# Patient Record
Sex: Male | Born: 1940 | Race: White | Hispanic: No | Marital: Married | Smoking: Never smoker
Health system: Southern US, Community
[De-identification: ages and names within clinical notes are randomized; demographics above are authoritative.]

## PROBLEM LIST (undated history)

## (undated) DIAGNOSIS — C61 Malignant neoplasm of prostate: Secondary | ICD-10-CM

## (undated) DIAGNOSIS — I255 Ischemic cardiomyopathy: Secondary | ICD-10-CM

## (undated) DIAGNOSIS — I5022 Chronic systolic (congestive) heart failure: Secondary | ICD-10-CM

## (undated) DIAGNOSIS — D696 Thrombocytopenia, unspecified: Secondary | ICD-10-CM

## (undated) DIAGNOSIS — N529 Male erectile dysfunction, unspecified: Secondary | ICD-10-CM

## (undated) DIAGNOSIS — K922 Gastrointestinal hemorrhage, unspecified: Secondary | ICD-10-CM

## (undated) DIAGNOSIS — E1165 Type 2 diabetes mellitus with hyperglycemia: Secondary | ICD-10-CM

## (undated) DIAGNOSIS — N189 Chronic kidney disease, unspecified: Secondary | ICD-10-CM

## (undated) DIAGNOSIS — E785 Hyperlipidemia, unspecified: Secondary | ICD-10-CM

## (undated) DIAGNOSIS — I739 Peripheral vascular disease, unspecified: Secondary | ICD-10-CM

## (undated) DIAGNOSIS — I4891 Unspecified atrial fibrillation: Secondary | ICD-10-CM

## (undated) DIAGNOSIS — K625 Hemorrhage of anus and rectum: Secondary | ICD-10-CM

## (undated) DIAGNOSIS — F17200 Nicotine dependence, unspecified, uncomplicated: Secondary | ICD-10-CM

## (undated) DIAGNOSIS — I10 Essential (primary) hypertension: Secondary | ICD-10-CM

## (undated) DIAGNOSIS — E1129 Type 2 diabetes mellitus with other diabetic kidney complication: Secondary | ICD-10-CM

## (undated) DIAGNOSIS — N200 Calculus of kidney: Secondary | ICD-10-CM

## (undated) DIAGNOSIS — I2510 Atherosclerotic heart disease of native coronary artery without angina pectoris: Secondary | ICD-10-CM

## (undated) HISTORY — DX: Thrombocytopenia, unspecified: D69.6

## (undated) HISTORY — DX: Calculus of kidney: N20.0

## (undated) HISTORY — PX: BACK SURGERY: SHX140

## (undated) HISTORY — DX: Essential (primary) hypertension: I10

## (undated) HISTORY — DX: Type 2 diabetes mellitus with hyperglycemia: E11.65

## (undated) HISTORY — DX: Nicotine dependence, unspecified, uncomplicated: F17.200

## (undated) HISTORY — DX: Hyperlipidemia, unspecified: E78.5

## (undated) HISTORY — DX: Peripheral vascular disease, unspecified: I73.9

## (undated) HISTORY — DX: Chronic systolic (congestive) heart failure: I50.22

## (undated) HISTORY — DX: Male erectile dysfunction, unspecified: N52.9

## (undated) HISTORY — DX: Hemorrhage of anus and rectum: K62.5

## (undated) HISTORY — DX: Atherosclerotic heart disease of native coronary artery without angina pectoris: I25.10

## (undated) HISTORY — DX: Chronic kidney disease, unspecified: N18.9

## (undated) HISTORY — DX: Gastrointestinal hemorrhage, unspecified: K92.2

## (undated) HISTORY — DX: Unspecified atrial fibrillation: I48.91

## (undated) HISTORY — DX: Malignant neoplasm of prostate: C61

## (undated) HISTORY — DX: Ischemic cardiomyopathy: I25.5

## (undated) HISTORY — DX: Type 2 diabetes mellitus with other diabetic kidney complication: E11.29

---

## 1997-12-26 DIAGNOSIS — N209 Urinary calculus, unspecified: Secondary | ICD-10-CM | POA: Insufficient documentation

## 1997-12-26 HISTORY — DX: Urinary calculus, unspecified: N20.9

## 1999-04-26 DIAGNOSIS — E785 Hyperlipidemia, unspecified: Secondary | ICD-10-CM | POA: Insufficient documentation

## 1999-04-26 DIAGNOSIS — E1169 Type 2 diabetes mellitus with other specified complication: Secondary | ICD-10-CM | POA: Insufficient documentation

## 2000-02-04 DIAGNOSIS — I2510 Atherosclerotic heart disease of native coronary artery without angina pectoris: Secondary | ICD-10-CM | POA: Insufficient documentation

## 2003-12-27 HISTORY — PX: COLONOSCOPY: SHX174

## 2004-01-01 ENCOUNTER — Emergency Department (HOSPITAL_COMMUNITY): Admission: EM | Admit: 2004-01-01 | Discharge: 2004-01-01 | Payer: Self-pay

## 2004-02-23 DIAGNOSIS — K922 Gastrointestinal hemorrhage, unspecified: Secondary | ICD-10-CM | POA: Insufficient documentation

## 2004-03-30 ENCOUNTER — Ambulatory Visit (HOSPITAL_COMMUNITY): Admission: RE | Admit: 2004-03-30 | Discharge: 2004-03-30 | Payer: Self-pay | Admitting: *Deleted

## 2004-06-22 DIAGNOSIS — R21 Rash and other nonspecific skin eruption: Secondary | ICD-10-CM | POA: Insufficient documentation

## 2005-03-29 ENCOUNTER — Encounter: Admission: RE | Admit: 2005-03-29 | Discharge: 2005-03-29 | Payer: Self-pay | Admitting: Urology

## 2005-04-05 ENCOUNTER — Ambulatory Visit: Admission: RE | Admit: 2005-04-05 | Discharge: 2005-05-11 | Payer: Self-pay | Admitting: Radiation Oncology

## 2005-07-06 ENCOUNTER — Ambulatory Visit: Admission: RE | Admit: 2005-07-06 | Discharge: 2005-10-04 | Payer: Self-pay | Admitting: Radiation Oncology

## 2005-10-05 ENCOUNTER — Ambulatory Visit: Admission: RE | Admit: 2005-10-05 | Discharge: 2006-01-03 | Payer: Self-pay | Admitting: Radiation Oncology

## 2005-10-27 DIAGNOSIS — I10 Essential (primary) hypertension: Secondary | ICD-10-CM | POA: Insufficient documentation

## 2005-10-27 DIAGNOSIS — N183 Chronic kidney disease, stage 3 unspecified: Secondary | ICD-10-CM | POA: Insufficient documentation

## 2006-09-08 DIAGNOSIS — R0789 Other chest pain: Secondary | ICD-10-CM | POA: Insufficient documentation

## 2006-09-08 DIAGNOSIS — Z79899 Other long term (current) drug therapy: Secondary | ICD-10-CM | POA: Insufficient documentation

## 2006-09-08 DIAGNOSIS — R39198 Other difficulties with micturition: Secondary | ICD-10-CM | POA: Insufficient documentation

## 2006-09-21 DIAGNOSIS — R739 Hyperglycemia, unspecified: Secondary | ICD-10-CM | POA: Insufficient documentation

## 2006-11-28 DIAGNOSIS — E119 Type 2 diabetes mellitus without complications: Secondary | ICD-10-CM | POA: Insufficient documentation

## 2007-09-04 DIAGNOSIS — IMO0001 Reserved for inherently not codable concepts without codable children: Secondary | ICD-10-CM | POA: Insufficient documentation

## 2007-10-08 DIAGNOSIS — Z87442 Personal history of urinary calculi: Secondary | ICD-10-CM | POA: Insufficient documentation

## 2007-10-08 DIAGNOSIS — Z8546 Personal history of malignant neoplasm of prostate: Secondary | ICD-10-CM | POA: Insufficient documentation

## 2007-10-08 DIAGNOSIS — N529 Male erectile dysfunction, unspecified: Secondary | ICD-10-CM | POA: Insufficient documentation

## 2007-10-08 HISTORY — DX: Personal history of urinary calculi: Z87.442

## 2008-12-26 HISTORY — PX: CATARACT EXTRACTION: SUR2

## 2009-09-30 DIAGNOSIS — R809 Proteinuria, unspecified: Secondary | ICD-10-CM

## 2009-09-30 HISTORY — DX: Proteinuria, unspecified: R80.9

## 2009-11-11 DIAGNOSIS — E669 Obesity, unspecified: Secondary | ICD-10-CM | POA: Insufficient documentation

## 2009-11-11 DIAGNOSIS — E1129 Type 2 diabetes mellitus with other diabetic kidney complication: Secondary | ICD-10-CM | POA: Insufficient documentation

## 2009-11-11 DIAGNOSIS — Z720 Tobacco use: Secondary | ICD-10-CM | POA: Insufficient documentation

## 2009-11-11 HISTORY — DX: Obesity, unspecified: E66.9

## 2009-11-11 HISTORY — DX: Tobacco use: Z72.0

## 2010-02-10 DIAGNOSIS — D696 Thrombocytopenia, unspecified: Secondary | ICD-10-CM | POA: Insufficient documentation

## 2010-02-10 DIAGNOSIS — C61 Malignant neoplasm of prostate: Secondary | ICD-10-CM | POA: Insufficient documentation

## 2010-05-12 DIAGNOSIS — K625 Hemorrhage of anus and rectum: Secondary | ICD-10-CM | POA: Insufficient documentation

## 2010-05-12 DIAGNOSIS — Z7689 Persons encountering health services in other specified circumstances: Secondary | ICD-10-CM | POA: Insufficient documentation

## 2010-05-12 DIAGNOSIS — Z0000 Encounter for general adult medical examination without abnormal findings: Secondary | ICD-10-CM | POA: Insufficient documentation

## 2010-05-12 DIAGNOSIS — I739 Peripheral vascular disease, unspecified: Secondary | ICD-10-CM | POA: Insufficient documentation

## 2010-05-12 HISTORY — DX: Persons encountering health services in other specified circumstances: Z76.89

## 2010-06-10 DIAGNOSIS — I10 Essential (primary) hypertension: Secondary | ICD-10-CM

## 2010-06-10 HISTORY — DX: Essential (primary) hypertension: I10

## 2010-09-14 DIAGNOSIS — Z794 Long term (current) use of insulin: Secondary | ICD-10-CM | POA: Insufficient documentation

## 2010-09-14 DIAGNOSIS — E119 Type 2 diabetes mellitus without complications: Secondary | ICD-10-CM | POA: Insufficient documentation

## 2011-03-16 DIAGNOSIS — E663 Overweight: Secondary | ICD-10-CM | POA: Insufficient documentation

## 2011-05-10 DIAGNOSIS — E1129 Type 2 diabetes mellitus with other diabetic kidney complication: Secondary | ICD-10-CM

## 2011-05-10 DIAGNOSIS — E669 Obesity, unspecified: Secondary | ICD-10-CM

## 2011-05-10 DIAGNOSIS — I2510 Atherosclerotic heart disease of native coronary artery without angina pectoris: Secondary | ICD-10-CM

## 2011-05-10 DIAGNOSIS — C61 Malignant neoplasm of prostate: Secondary | ICD-10-CM

## 2011-05-10 DIAGNOSIS — R809 Proteinuria, unspecified: Secondary | ICD-10-CM

## 2011-05-10 DIAGNOSIS — R21 Rash and other nonspecific skin eruption: Secondary | ICD-10-CM

## 2011-05-10 DIAGNOSIS — Z8546 Personal history of malignant neoplasm of prostate: Secondary | ICD-10-CM

## 2011-05-10 DIAGNOSIS — IMO0001 Reserved for inherently not codable concepts without codable children: Secondary | ICD-10-CM

## 2011-05-10 DIAGNOSIS — R0789 Other chest pain: Secondary | ICD-10-CM

## 2011-05-10 DIAGNOSIS — K922 Gastrointestinal hemorrhage, unspecified: Secondary | ICD-10-CM

## 2011-05-10 DIAGNOSIS — Z87442 Personal history of urinary calculi: Secondary | ICD-10-CM

## 2011-05-10 DIAGNOSIS — D696 Thrombocytopenia, unspecified: Secondary | ICD-10-CM

## 2011-05-10 DIAGNOSIS — I10 Essential (primary) hypertension: Secondary | ICD-10-CM

## 2011-05-10 DIAGNOSIS — I739 Peripheral vascular disease, unspecified: Secondary | ICD-10-CM

## 2011-05-10 DIAGNOSIS — E663 Overweight: Secondary | ICD-10-CM

## 2011-05-10 DIAGNOSIS — N529 Male erectile dysfunction, unspecified: Secondary | ICD-10-CM

## 2011-05-10 DIAGNOSIS — R39198 Other difficulties with micturition: Secondary | ICD-10-CM

## 2011-05-10 DIAGNOSIS — E785 Hyperlipidemia, unspecified: Secondary | ICD-10-CM

## 2011-05-10 DIAGNOSIS — E119 Type 2 diabetes mellitus without complications: Secondary | ICD-10-CM

## 2011-05-10 DIAGNOSIS — Z0000 Encounter for general adult medical examination without abnormal findings: Secondary | ICD-10-CM

## 2011-05-10 DIAGNOSIS — R739 Hyperglycemia, unspecified: Secondary | ICD-10-CM

## 2011-05-10 DIAGNOSIS — N209 Urinary calculus, unspecified: Secondary | ICD-10-CM

## 2011-05-10 DIAGNOSIS — K625 Hemorrhage of anus and rectum: Secondary | ICD-10-CM

## 2011-05-10 DIAGNOSIS — Z720 Tobacco use: Secondary | ICD-10-CM

## 2011-05-10 DIAGNOSIS — Z79899 Other long term (current) drug therapy: Secondary | ICD-10-CM

## 2012-01-10 DIAGNOSIS — E1129 Type 2 diabetes mellitus with other diabetic kidney complication: Secondary | ICD-10-CM | POA: Diagnosis not present

## 2012-01-10 DIAGNOSIS — E663 Overweight: Secondary | ICD-10-CM | POA: Diagnosis not present

## 2012-01-10 DIAGNOSIS — C61 Malignant neoplasm of prostate: Secondary | ICD-10-CM | POA: Diagnosis not present

## 2012-01-10 DIAGNOSIS — R21 Rash and other nonspecific skin eruption: Secondary | ICD-10-CM | POA: Diagnosis not present

## 2012-01-10 DIAGNOSIS — I129 Hypertensive chronic kidney disease with stage 1 through stage 4 chronic kidney disease, or unspecified chronic kidney disease: Secondary | ICD-10-CM | POA: Diagnosis not present

## 2012-01-10 DIAGNOSIS — I10 Essential (primary) hypertension: Secondary | ICD-10-CM | POA: Diagnosis not present

## 2012-01-11 DIAGNOSIS — Z8546 Personal history of malignant neoplasm of prostate: Secondary | ICD-10-CM | POA: Diagnosis not present

## 2012-01-17 DIAGNOSIS — E1129 Type 2 diabetes mellitus with other diabetic kidney complication: Secondary | ICD-10-CM | POA: Diagnosis not present

## 2012-01-17 DIAGNOSIS — E785 Hyperlipidemia, unspecified: Secondary | ICD-10-CM | POA: Diagnosis not present

## 2012-01-17 DIAGNOSIS — I10 Essential (primary) hypertension: Secondary | ICD-10-CM | POA: Diagnosis not present

## 2012-01-17 DIAGNOSIS — R21 Rash and other nonspecific skin eruption: Secondary | ICD-10-CM | POA: Diagnosis not present

## 2012-01-17 DIAGNOSIS — E669 Obesity, unspecified: Secondary | ICD-10-CM | POA: Diagnosis not present

## 2012-01-18 DIAGNOSIS — E291 Testicular hypofunction: Secondary | ICD-10-CM | POA: Diagnosis not present

## 2012-01-18 DIAGNOSIS — Z8546 Personal history of malignant neoplasm of prostate: Secondary | ICD-10-CM | POA: Diagnosis not present

## 2012-01-18 DIAGNOSIS — Z87442 Personal history of urinary calculi: Secondary | ICD-10-CM | POA: Diagnosis not present

## 2012-03-15 DIAGNOSIS — I129 Hypertensive chronic kidney disease with stage 1 through stage 4 chronic kidney disease, or unspecified chronic kidney disease: Secondary | ICD-10-CM | POA: Diagnosis not present

## 2012-03-15 DIAGNOSIS — E1129 Type 2 diabetes mellitus with other diabetic kidney complication: Secondary | ICD-10-CM | POA: Diagnosis not present

## 2012-03-19 DIAGNOSIS — R809 Proteinuria, unspecified: Secondary | ICD-10-CM | POA: Diagnosis not present

## 2012-03-19 DIAGNOSIS — I10 Essential (primary) hypertension: Secondary | ICD-10-CM | POA: Diagnosis not present

## 2012-03-19 DIAGNOSIS — E1129 Type 2 diabetes mellitus with other diabetic kidney complication: Secondary | ICD-10-CM | POA: Diagnosis not present

## 2012-03-19 DIAGNOSIS — E785 Hyperlipidemia, unspecified: Secondary | ICD-10-CM | POA: Diagnosis not present

## 2012-04-05 DIAGNOSIS — R809 Proteinuria, unspecified: Secondary | ICD-10-CM | POA: Diagnosis not present

## 2012-04-05 DIAGNOSIS — I10 Essential (primary) hypertension: Secondary | ICD-10-CM | POA: Diagnosis not present

## 2012-04-17 DIAGNOSIS — E785 Hyperlipidemia, unspecified: Secondary | ICD-10-CM | POA: Diagnosis not present

## 2012-04-17 DIAGNOSIS — E1129 Type 2 diabetes mellitus with other diabetic kidney complication: Secondary | ICD-10-CM | POA: Diagnosis not present

## 2012-04-19 DIAGNOSIS — E785 Hyperlipidemia, unspecified: Secondary | ICD-10-CM | POA: Diagnosis not present

## 2012-04-19 DIAGNOSIS — I10 Essential (primary) hypertension: Secondary | ICD-10-CM | POA: Diagnosis not present

## 2012-04-19 DIAGNOSIS — E1129 Type 2 diabetes mellitus with other diabetic kidney complication: Secondary | ICD-10-CM | POA: Diagnosis not present

## 2012-07-12 DIAGNOSIS — E119 Type 2 diabetes mellitus without complications: Secondary | ICD-10-CM | POA: Diagnosis not present

## 2012-07-12 DIAGNOSIS — E785 Hyperlipidemia, unspecified: Secondary | ICD-10-CM | POA: Diagnosis not present

## 2012-07-16 DIAGNOSIS — I10 Essential (primary) hypertension: Secondary | ICD-10-CM | POA: Diagnosis not present

## 2012-07-16 DIAGNOSIS — E1129 Type 2 diabetes mellitus with other diabetic kidney complication: Secondary | ICD-10-CM | POA: Diagnosis not present

## 2012-07-16 DIAGNOSIS — E785 Hyperlipidemia, unspecified: Secondary | ICD-10-CM | POA: Diagnosis not present

## 2012-07-18 DIAGNOSIS — Z8546 Personal history of malignant neoplasm of prostate: Secondary | ICD-10-CM | POA: Diagnosis not present

## 2012-07-25 DIAGNOSIS — Z87442 Personal history of urinary calculi: Secondary | ICD-10-CM | POA: Diagnosis not present

## 2012-07-25 DIAGNOSIS — Z8546 Personal history of malignant neoplasm of prostate: Secondary | ICD-10-CM | POA: Diagnosis not present

## 2012-07-25 DIAGNOSIS — E291 Testicular hypofunction: Secondary | ICD-10-CM | POA: Diagnosis not present

## 2012-07-25 DIAGNOSIS — N529 Male erectile dysfunction, unspecified: Secondary | ICD-10-CM | POA: Diagnosis not present

## 2012-10-05 DIAGNOSIS — Z961 Presence of intraocular lens: Secondary | ICD-10-CM | POA: Diagnosis not present

## 2012-10-05 DIAGNOSIS — H26499 Other secondary cataract, unspecified eye: Secondary | ICD-10-CM | POA: Diagnosis not present

## 2012-10-29 DIAGNOSIS — Z23 Encounter for immunization: Secondary | ICD-10-CM | POA: Diagnosis not present

## 2012-10-29 DIAGNOSIS — I10 Essential (primary) hypertension: Secondary | ICD-10-CM | POA: Diagnosis not present

## 2012-11-08 DIAGNOSIS — E1129 Type 2 diabetes mellitus with other diabetic kidney complication: Secondary | ICD-10-CM | POA: Diagnosis not present

## 2012-11-08 DIAGNOSIS — R7989 Other specified abnormal findings of blood chemistry: Secondary | ICD-10-CM | POA: Diagnosis not present

## 2012-11-08 DIAGNOSIS — Z79899 Other long term (current) drug therapy: Secondary | ICD-10-CM | POA: Diagnosis not present

## 2012-11-08 DIAGNOSIS — IMO0001 Reserved for inherently not codable concepts without codable children: Secondary | ICD-10-CM | POA: Diagnosis not present

## 2012-11-08 DIAGNOSIS — E785 Hyperlipidemia, unspecified: Secondary | ICD-10-CM | POA: Diagnosis not present

## 2012-11-08 DIAGNOSIS — I10 Essential (primary) hypertension: Secondary | ICD-10-CM | POA: Diagnosis not present

## 2012-11-12 DIAGNOSIS — I10 Essential (primary) hypertension: Secondary | ICD-10-CM | POA: Diagnosis not present

## 2012-11-12 DIAGNOSIS — E785 Hyperlipidemia, unspecified: Secondary | ICD-10-CM | POA: Diagnosis not present

## 2012-11-12 DIAGNOSIS — R809 Proteinuria, unspecified: Secondary | ICD-10-CM | POA: Diagnosis not present

## 2012-11-12 DIAGNOSIS — E1129 Type 2 diabetes mellitus with other diabetic kidney complication: Secondary | ICD-10-CM | POA: Diagnosis not present

## 2013-01-23 DIAGNOSIS — Z8546 Personal history of malignant neoplasm of prostate: Secondary | ICD-10-CM | POA: Diagnosis not present

## 2013-01-23 DIAGNOSIS — E291 Testicular hypofunction: Secondary | ICD-10-CM | POA: Diagnosis not present

## 2013-01-30 DIAGNOSIS — E291 Testicular hypofunction: Secondary | ICD-10-CM | POA: Diagnosis not present

## 2013-01-30 DIAGNOSIS — Z8546 Personal history of malignant neoplasm of prostate: Secondary | ICD-10-CM | POA: Diagnosis not present

## 2013-05-09 ENCOUNTER — Other Ambulatory Visit: Payer: Self-pay | Admitting: *Deleted

## 2013-05-09 ENCOUNTER — Other Ambulatory Visit: Payer: Medicare Other

## 2013-05-09 DIAGNOSIS — IMO0001 Reserved for inherently not codable concepts without codable children: Secondary | ICD-10-CM

## 2013-05-09 DIAGNOSIS — E785 Hyperlipidemia, unspecified: Secondary | ICD-10-CM

## 2013-05-09 DIAGNOSIS — I10 Essential (primary) hypertension: Secondary | ICD-10-CM | POA: Diagnosis not present

## 2013-05-10 LAB — COMPREHENSIVE METABOLIC PANEL
ALT: 17 IU/L (ref 0–44)
AST: 17 IU/L (ref 0–40)
Albumin/Globulin Ratio: 1.6 (ref 1.1–2.5)
Albumin: 4.1 g/dL (ref 3.5–4.8)
Alkaline Phosphatase: 46 IU/L (ref 39–117)
BUN/Creatinine Ratio: 21 (ref 10–22)
BUN: 21 mg/dL (ref 8–27)
CO2: 24 mmol/L (ref 19–28)
Calcium: 9.6 mg/dL (ref 8.6–10.2)
Chloride: 101 mmol/L (ref 97–108)
Creatinine, Ser: 1 mg/dL (ref 0.76–1.27)
GFR calc Af Amer: 88 mL/min/{1.73_m2} (ref >59)
GFR calc non Af Amer: 76 mL/min/{1.73_m2} (ref >59)
Globulin, Total: 2.6 g/dL (ref 1.5–4.5)
Glucose: 148 mg/dL — ABNORMAL HIGH (ref 65–99)
Potassium: 3.9 mmol/L (ref 3.5–5.2)
Sodium: 140 mmol/L (ref 134–144)
Total Bilirubin: 0.6 mg/dL (ref 0.0–1.2)
Total Protein: 6.7 g/dL (ref 6.0–8.5)

## 2013-05-10 LAB — HEMOGLOBIN A1C
Est. average glucose Bld gHb Est-mCnc: 189 mg/dL
Hgb A1c MFr Bld: 8.2 % — ABNORMAL HIGH (ref 4.8–5.6)

## 2013-05-10 LAB — LIPID PANEL
Chol/HDL Ratio: 4.1 ratio units (ref 0.0–5.0)
Cholesterol, Total: 150 mg/dL (ref 100–199)
HDL: 37 mg/dL — ABNORMAL LOW (ref >39)
LDL Calculated: 76 mg/dL (ref 0–99)
Triglycerides: 185 mg/dL — ABNORMAL HIGH (ref 0–149)
VLDL Cholesterol Cal: 37 mg/dL (ref 5–40)

## 2013-05-16 ENCOUNTER — Other Ambulatory Visit: Payer: Self-pay | Admitting: *Deleted

## 2013-05-16 MED ORDER — METFORMIN HCL 1000 MG PO TABS: ORAL_TABLET | ORAL | Status: DC

## 2013-05-21 ENCOUNTER — Encounter: Payer: Self-pay | Admitting: *Deleted

## 2013-05-22 ENCOUNTER — Ambulatory Visit (INDEPENDENT_AMBULATORY_CARE_PROVIDER_SITE_OTHER): Payer: Medicare Other | Admitting: Internal Medicine

## 2013-05-22 ENCOUNTER — Encounter: Payer: Self-pay | Admitting: Internal Medicine

## 2013-05-22 VITALS — BP 130/74 | HR 60 | Temp 98.2°F | Resp 14 | Ht 69.5 in | Wt 251.8 lb

## 2013-05-22 DIAGNOSIS — E1165 Type 2 diabetes mellitus with hyperglycemia: Secondary | ICD-10-CM

## 2013-05-22 DIAGNOSIS — R5383 Other fatigue: Secondary | ICD-10-CM

## 2013-05-22 DIAGNOSIS — E1129 Type 2 diabetes mellitus with other diabetic kidney complication: Secondary | ICD-10-CM | POA: Diagnosis not present

## 2013-05-22 DIAGNOSIS — N182 Chronic kidney disease, stage 2 (mild): Secondary | ICD-10-CM | POA: Diagnosis not present

## 2013-05-22 DIAGNOSIS — N189 Chronic kidney disease, unspecified: Secondary | ICD-10-CM | POA: Insufficient documentation

## 2013-05-22 DIAGNOSIS — R5381 Other malaise: Secondary | ICD-10-CM

## 2013-05-22 DIAGNOSIS — I10 Essential (primary) hypertension: Secondary | ICD-10-CM

## 2013-05-22 DIAGNOSIS — E785 Hyperlipidemia, unspecified: Secondary | ICD-10-CM | POA: Insufficient documentation

## 2013-05-22 NOTE — Progress Notes (Signed)
Subjective:    Patient ID: Seth [name], male    DOB: 1941-02-19, 72 y.o.   MRN: [id number]  No Known Allergies  HPI  Pt here after more than a year for follow up. He complaints of being tired easily.  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 Last a1c was 7.4. cbg this am was 117. cbg in am has ranged between 96-110. He has been under stress with his father in law passing away and mother in law not in good health. He eats out all the time. He walks on treadmill every day for 30 minutes for exercise. On glipizide 5 mg bid and metformin 1000 mg bid with ACEI, ASA. Seen eye doctor a month back. Denies any polyuria, polydypsia or polyphagia. Denies any sores/ wounds, callus, tingling, numbness or pain in his feet or hands  HTN- compliant with meds.check s bp at home. SBP 125-135 MOSTLY and DBP 65-70. Not careful with diet but exercises half an hour daily in treadmill  He follows with renal  Review of Systems  Constitutional: Positive for fatigue. Negative for fever, chills, diaphoresis and activity change.  HENT: Negative for nosebleeds, mouth sores and neck pain.   Respiratory: Negative for cough, chest tightness and shortness of breath.   Cardiovascular: Negative for chest pain, palpitations and leg swelling.  Gastrointestinal: Positive for constipation. Negative for nausea, vomiting, abdominal pain and abdominal distention.  Genitourinary: Negative for dysuria and frequency.  Musculoskeletal: Negative for arthralgias.  Skin: Negative for rash and wound.  Neurological: Negative for dizziness and light-headedness.  Psychiatric/Behavioral: Negative for sleep disturbance and agitation.       Objective:   Physical Exam  BP 130/74  Pulse 60  Temp(Src) 98.2 F (36.8 C) (Oral)  Resp 14  Ht 5' 9.5" (1.765 m)  Wt 251 lb 12.8 oz (114.216 kg)  BMI 36.66 kg/m2  gen- adult, obese male in NAD heent- no pallor, no icterus, MMM, no JVD, no LAD cvs- normal s1, s2, rrr respi- b/l CTA,  no wheeze/ rhonchi abdo- bowel sounds present, soft, non tender Ext- able to move all 4, no edema, refuses foot exam Neuro- aaox 3, no focal deficit Psych- behavior appropriate  LABS- Hemoglobin A1C  8.2 (H)    CMP     Component Value Date/Time   NA 140 05/09/2013 0914   K 3.9 05/09/2013 0914   CL 101 05/09/2013 0914   CO2 24 05/09/2013 0914   GLUCOSE 148* 05/09/2013 0914   BUN 21 05/09/2013 0914   CREATININE 1.00 05/09/2013 0914   CALCIUM 9.6 05/09/2013 0914   PROT 6.7 05/09/2013 0914   AST 17 05/09/2013 0914   ALT 17 05/09/2013 0914   ALKPHOS 46 05/09/2013 0914   BILITOT 0.6 05/09/2013 0914   GFRNONAA 76 05/09/2013 0914   GFRAA 88 05/09/2013 0914   Lipid Panel     Component Value Date/Time   TRIG 185* 05/09/2013 0914   HDL 37* 05/09/2013 0914   CHOLHDL 4.1 05/09/2013 0914   LDLCALC 76 05/09/2013 0914       Assessment & Plan:   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 Last a1c was 7.4. Uncontrolled DM. Suggested addition of a third agent but patient refuses any med changes. He wants to continue metformin and glipizide but refuses addition of any other agent. He does not want to be on insulin. Explained the complications that can arise with elevated blood sugar and he mentions understanding this. Pt wants to try diet and exercise  for next few months and then think about adding another agent. To record cbg twice a day. Recheck a1c next visit  Hyperlipidemia- elevated TG but other levels under control. Refuses medications  Obesity- encouraged to exercise and be careful with his diet- meal planning education provided  Hypertensive renal disease- taking amlodipine 10 mg daily, triamterene-hctz, lisinopril and carvedilol. Mentions being complaint. bp under control currently. 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 Follows with dr Mercy Moore. Avoiding NSAIDs. Will need a1c under better control. Pt not willing to make med adjustment at  present  Malaise/faqtigue- likely from his lifestyle and uncontrolled dm. Denies any signs of depression. Has gained weight. No anemia. Will check thyroid function.

## 2013-05-22 NOTE — Patient Instructions (Signed)
Your diabetes is poorly controlled. Please check your blood sugar twice a day and bring the log book next office visit. If your sugar reading is persistnely more than 250, please notify the office. Take all your medications. Please bring your medications with you next visit.

## 2013-05-29 DIAGNOSIS — I10 Essential (primary) hypertension: Secondary | ICD-10-CM | POA: Diagnosis not present

## 2013-07-09 ENCOUNTER — Other Ambulatory Visit: Payer: Self-pay | Admitting: Nurse Practitioner

## 2013-07-18 DIAGNOSIS — H0019 Chalazion unspecified eye, unspecified eyelid: Secondary | ICD-10-CM | POA: Diagnosis not present

## 2013-07-29 ENCOUNTER — Other Ambulatory Visit: Payer: Self-pay | Admitting: Geriatric Medicine

## 2013-07-29 DIAGNOSIS — Z8546 Personal history of malignant neoplasm of prostate: Secondary | ICD-10-CM | POA: Diagnosis not present

## 2013-07-29 DIAGNOSIS — E291 Testicular hypofunction: Secondary | ICD-10-CM | POA: Diagnosis not present

## 2013-07-29 MED ORDER — TRIAMTERENE-HCTZ 37.5-25 MG PO TABS: 1.0000 | ORAL_TABLET | Freq: Every day | ORAL | Status: DC

## 2013-08-05 DIAGNOSIS — Z8546 Personal history of malignant neoplasm of prostate: Secondary | ICD-10-CM | POA: Diagnosis not present

## 2013-08-05 DIAGNOSIS — E291 Testicular hypofunction: Secondary | ICD-10-CM | POA: Diagnosis not present

## 2013-08-05 DIAGNOSIS — Z87442 Personal history of urinary calculi: Secondary | ICD-10-CM | POA: Diagnosis not present

## 2013-08-22 ENCOUNTER — Other Ambulatory Visit: Payer: Medicare Other

## 2013-08-22 DIAGNOSIS — E1129 Type 2 diabetes mellitus with other diabetic kidney complication: Secondary | ICD-10-CM

## 2013-08-22 DIAGNOSIS — R5381 Other malaise: Secondary | ICD-10-CM | POA: Diagnosis not present

## 2013-08-23 LAB — HEMOGLOBIN A1C
Est. average glucose Bld gHb Est-mCnc: 157 mg/dL
Hgb A1c MFr Bld: 7.1 % — ABNORMAL HIGH (ref 4.8–5.6)

## 2013-08-23 LAB — BASIC METABOLIC PANEL
BUN/Creatinine Ratio: 23 — ABNORMAL HIGH (ref 10–22)
BUN: 23 mg/dL (ref 8–27)
CO2: 24 mmol/L (ref 18–29)
Calcium: 9.5 mg/dL (ref 8.6–10.2)
Chloride: 103 mmol/L (ref 97–108)
Creatinine, Ser: 1.02 mg/dL (ref 0.76–1.27)
GFR calc Af Amer: 85 mL/min/{1.73_m2} (ref >59)
GFR calc non Af Amer: 74 mL/min/{1.73_m2} (ref >59)
Glucose: 171 mg/dL — ABNORMAL HIGH (ref 65–99)
Potassium: 3.7 mmol/L (ref 3.5–5.2)
Sodium: 143 mmol/L (ref 134–144)

## 2013-08-23 LAB — TSH: TSH: 1.89 u[IU]/mL (ref 0.450–4.500)

## 2013-08-27 ENCOUNTER — Ambulatory Visit (INDEPENDENT_AMBULATORY_CARE_PROVIDER_SITE_OTHER): Payer: Medicare Other | Admitting: Internal Medicine

## 2013-08-27 ENCOUNTER — Encounter: Payer: Self-pay | Admitting: Internal Medicine

## 2013-08-27 VITALS — BP 138/70 | HR 74 | Temp 97.6°F | Resp 18 | Ht 69.0 in | Wt 246.8 lb

## 2013-08-27 DIAGNOSIS — E1129 Type 2 diabetes mellitus with other diabetic kidney complication: Secondary | ICD-10-CM

## 2013-08-27 DIAGNOSIS — Z23 Encounter for immunization: Secondary | ICD-10-CM

## 2013-08-27 DIAGNOSIS — I10 Essential (primary) hypertension: Secondary | ICD-10-CM

## 2013-08-27 DIAGNOSIS — F17200 Nicotine dependence, unspecified, uncomplicated: Secondary | ICD-10-CM

## 2013-08-27 DIAGNOSIS — Z720 Tobacco use: Secondary | ICD-10-CM

## 2013-08-27 DIAGNOSIS — E785 Hyperlipidemia, unspecified: Secondary | ICD-10-CM | POA: Diagnosis not present

## 2013-08-27 DIAGNOSIS — E663 Overweight: Secondary | ICD-10-CM

## 2013-08-27 MED ORDER — ATORVASTATIN CALCIUM 10 MG PO TABS: 10.0000 mg | ORAL_TABLET | Freq: Every day | ORAL | Status: DC

## 2013-08-27 NOTE — Progress Notes (Signed)
Patient ID: Seth [name], male   DOB: [birth year], 72 y.o.   MRN: [id number]  Chief Complaint  Patient presents with  . Follow-up    hypertension, DM, thyroid   No Known Allergies  HPI  He is here for a routine follow up visit. He has no specific concerns for today. He has lost 6 pounds since last visit. He has been exercising and tries to be careful with his diet. He follows it on and off. He is compliant with his medications. His labs were reviewed with him. His a1c has improved. cbg this am 126 and mostly is between 80-110 in am. No hypoglycemic episodes uptodate with eye doctor visit He checks his bp at home SBP mostly in 120s He follows with renal and urology  Review of Systems  Constitutional: Negative for fever, chills, fatigue, diaphoresis and activity change.  HENT: Negative for nosebleeds, mouth sores and neck pain.   Respiratory: Negative for cough, chest tightness and shortness of breath.   Cardiovascular: Negative for chest pain, palpitations and leg swelling.  Gastrointestinal: has regular bowel movement. Negative for nausea, vomiting, abdominal pain and abdominal distention.  Genitourinary: Negative for dysuria and frequency.  Musculoskeletal: Negative for arthralgias.  Skin: Negative for rash and wound.  Neurological: Negative for dizziness and light-headedness.  Psychiatric/Behavioral: Negative for sleep disturbance and agitation.   Past Medical History  Diagnosis Date  . Impotence of organic origin   . Rash and other nonspecific skin eruption   . Hypertensive renal disease, benign   . Overweight(278.02)   . Type II or unspecified type diabetes mellitus with renal manifestations, uncontrolled(250.42)   . Peripheral vascular disease, unspecified   . Essential hypertension, benign   . Hemorrhage of rectum and anus   . Malignant neoplasm of prostate   . Thrombocytopenia, unspecified   . Tobacco use disorder   . Proteinuria   . Personal history of malignant  neoplasm of prostate   . Personal history of urinary calculi   . Other abnormal blood chemistry   . Other chest pain   . Slowing of urinary stream   . Encounter for long-term (current) use of other medications   . Rash and other nonspecific skin eruption   . Hemorrhage of gastrointestinal tract, unspecified   . Coronary atherosclerosis of unspecified type of vessel, native or graft   . Other and unspecified hyperlipidemia   . Calculus of kidney     Medication reviewed  Physical exam  BP 138/70  Pulse 74  Temp(Src) 97.6 F (36.4 C) (Oral)  Resp 18  Ht 5\' 9"  (1.753 m)  Wt 246 lb 12.8 oz (111.948 kg)  BMI 36.43 kg/m2  SpO2 97%  gen- adult, obese male in NAD heent- no pallor, no icterus, MMM, no JVD, no LAD cvs- normal s1, s2, rrr respi- b/l CTA, no wheeze/ rhonchi abdo- bowel sounds present, soft, non tender Ext- able to move all 4, no edema, no callus or sores or ulcers, normal distal pedal pulses Neuro- aaox 3, no focal deficit Psych- behavior appropriate  LABS-  CMP     Component Value Date/Time   NA 143 08/22/2013 0835   K 3.7 08/22/2013 0835   CL 103 08/22/2013 0835   CO2 24 08/22/2013 0835   GLUCOSE 171* 08/22/2013 0835   BUN 23 08/22/2013 0835   CREATININE 1.02 08/22/2013 0835   CALCIUM 9.5 08/22/2013 0835   PROT 6.7 05/09/2013 0914   AST 17 05/09/2013 0914   ALT 17 05/09/2013 0914  ALKPHOS 46 05/09/2013 0914   BILITOT 0.6 05/09/2013 0914   GFRNONAA 74 08/22/2013 0835   GFRAA 85 08/22/2013 0835    Lipid Panel     Component Value Date/Time   TRIG 185* 05/09/2013 0914   HDL 37* 05/09/2013 0914   CHOLHDL 4.1 05/09/2013 0914   LDLCALC 76 05/09/2013 0914   Hemoglobin A1C 4.8 - 5.6 %  7.1 (H)   8.2 (H) CM   TSH 0.450 - 4.500 uIU/mL  1.890      ASSESSMENT/PLAN  DM type 2 with renal manifestations- a1c 7.1 today compared to 8. 2 last visit. He has also lost 5 lbs. Will continue metformin and glipizide for now. On aspirin and lisinopril. Will add statin today.  Check urine microalbumin, a1c and lipid panel prior to next visit  Hyperlipidemia- elevated TG but other levels under control. Will start atorvastatin 10 mg daily for now. Common side effects with the medication explained  Overweight- encouraged to exercise and be careful with his diet  Osteopenia- continue ca-vit d supplement for now  Hypertensive renal disease- taking amlodipine 10 mg daily, triamterene-hctz, lisinopril and carvedilol. bp under control currently. 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 Follows with dr Mercy Moore. Avoiding NSAIDs.   Tobacco user- chews tobacco ocassionally ad does not want to quit  Will provide influenza vaccination today

## 2013-09-06 ENCOUNTER — Other Ambulatory Visit: Payer: Medicare Other

## 2013-12-06 ENCOUNTER — Other Ambulatory Visit: Payer: Medicare Other

## 2013-12-06 DIAGNOSIS — I10 Essential (primary) hypertension: Secondary | ICD-10-CM | POA: Diagnosis not present

## 2013-12-06 DIAGNOSIS — T887XXA Unspecified adverse effect of drug or medicament, initial encounter: Secondary | ICD-10-CM | POA: Diagnosis not present

## 2013-12-06 DIAGNOSIS — E785 Hyperlipidemia, unspecified: Secondary | ICD-10-CM

## 2013-12-07 LAB — COMPREHENSIVE METABOLIC PANEL
ALT: 12 IU/L (ref 0–44)
AST: 13 IU/L (ref 0–40)
Albumin/Globulin Ratio: 1.6 (ref 1.1–2.5)
Albumin: 4.2 g/dL (ref 3.5–4.8)
Alkaline Phosphatase: 46 IU/L (ref 39–117)
BUN/Creatinine Ratio: 28 — ABNORMAL HIGH (ref 10–22)
BUN: 23 mg/dL (ref 8–27)
CO2: 23 mmol/L (ref 18–29)
Calcium: 9.4 mg/dL (ref 8.6–10.2)
Chloride: 103 mmol/L (ref 97–108)
Creatinine, Ser: 0.81 mg/dL (ref 0.76–1.27)
GFR calc Af Amer: 103 mL/min/{1.73_m2} (ref >59)
GFR calc non Af Amer: 89 mL/min/{1.73_m2} (ref >59)
Globulin, Total: 2.6 g/dL (ref 1.5–4.5)
Glucose: 177 mg/dL — ABNORMAL HIGH (ref 65–99)
Potassium: 3.8 mmol/L (ref 3.5–5.2)
Sodium: 143 mmol/L (ref 134–144)
Total Bilirubin: 0.4 mg/dL (ref 0.0–1.2)
Total Protein: 6.8 g/dL (ref 6.0–8.5)

## 2013-12-07 LAB — LIPID PANEL
Chol/HDL Ratio: 2.6 ratio units (ref 0.0–5.0)
Cholesterol, Total: 104 mg/dL (ref 100–199)
HDL: 40 mg/dL (ref >39)
LDL Calculated: 48 mg/dL (ref 0–99)
Triglycerides: 81 mg/dL (ref 0–149)
VLDL Cholesterol Cal: 16 mg/dL (ref 5–40)

## 2013-12-10 ENCOUNTER — Ambulatory Visit (INDEPENDENT_AMBULATORY_CARE_PROVIDER_SITE_OTHER): Payer: Medicare Other | Admitting: Internal Medicine

## 2013-12-10 ENCOUNTER — Encounter: Payer: Self-pay | Admitting: Internal Medicine

## 2013-12-10 VITALS — BP 126/70 | HR 77 | Temp 97.8°F | Wt 251.0 lb

## 2013-12-10 DIAGNOSIS — N189 Chronic kidney disease, unspecified: Secondary | ICD-10-CM

## 2013-12-10 DIAGNOSIS — E785 Hyperlipidemia, unspecified: Secondary | ICD-10-CM | POA: Diagnosis not present

## 2013-12-10 DIAGNOSIS — I10 Essential (primary) hypertension: Secondary | ICD-10-CM

## 2013-12-10 DIAGNOSIS — E1129 Type 2 diabetes mellitus with other diabetic kidney complication: Secondary | ICD-10-CM

## 2013-12-10 MED ORDER — GLIPIZIDE ER 5 MG PO TB24: ORAL_TABLET | ORAL | Status: DC

## 2013-12-10 MED ORDER — GLIPIZIDE 5 MG PO TABS: 5.0000 mg | ORAL_TABLET | Freq: Two times a day (BID) | ORAL | Status: DC

## 2013-12-10 NOTE — Progress Notes (Signed)
Patient ID: Seth [name], male   DOB: [birth year], 72 y.o.   MRN: [id number]  Chief Complaint  Patient presents with  . Medical Managment of Chronic Issues    3 month follow-up    No Known Allergies  HPI 72 y/o male pt here for routine follow up visit. He has been exercising and tries to be careful with his diet. cbg well controlled at home. No hypoglycemic episodes uptodate with eye doctor visit He checks his bp at home SBP mostly in 120s He follows with renal and urology  Review of Systems   Constitutional: Negative for fever, chills, fatigue, diaphoresis and activity change.   HENT: Negative for nosebleeds, mouth sores and neck pain.    Respiratory: Negative for cough, chest tightness and shortness of breath.    Cardiovascular: Negative for chest pain, palpitations and leg swelling.   Gastrointestinal: has regular bowel movement. Negative for nausea, vomiting, abdominal pain and abdominal distention.   Genitourinary: Negative for dysuria and frequency.   Musculoskeletal: Negative for arthralgias.   Skin: Negative for rash and wound.   Neurological: Negative for dizziness and light-headedness.   Psychiatric/Behavioral: Negative for sleep disturbance and agitation.   Past Medical History  Diagnosis Date  . Impotence of organic origin   . Rash and other nonspecific skin eruption   . Hypertensive renal disease, benign   . Overweight(278.02)   . Type II or unspecified type diabetes mellitus with renal manifestations, uncontrolled(250.42)   . Peripheral vascular disease, unspecified   . Essential hypertension, benign   . Hemorrhage of rectum and anus   . Malignant neoplasm of prostate   . Thrombocytopenia, unspecified   . Tobacco use disorder   . Proteinuria   . Personal history of malignant neoplasm of prostate   . Personal history of urinary calculi   . Other abnormal blood chemistry   . Other chest pain   . Slowing of urinary stream   . Encounter for long-term (current)  use of other medications   . Rash and other nonspecific skin eruption   . Hemorrhage of gastrointestinal tract, unspecified   . Coronary atherosclerosis of unspecified type of vessel, native or graft   . Other and unspecified hyperlipidemia   . Calculus of kidney    Medication reviewed. See [facility]  Past Surgical History  Procedure Laterality Date  . Back surgery    . Cataract extraction  2010    Physical exam BP 126/70  Pulse 77  Temp(Src) 97.8 F (36.6 C) (Oral)  Wt 251 lb (113.853 kg)  SpO2 98%  gen- adult, obese male in NAD heent- no pallor, no icterus, MMM, no JVD, no LAD cvs- normal s1, s2, rrr respi- b/l CTA, no wheeze/ rhonchi abdo- bowel sounds present, soft, non tender Ext- able to move all 4, no edema, no callus or sores or ulcers, normal distal pedal pulses Neuro- aaox 3, no focal deficit Psych- behavior appropriate  Labs-  CMP     Component Value Date/Time   NA 143 12/06/2013 0828   K 3.8 12/06/2013 0828   CL 103 12/06/2013 0828   CO2 23 12/06/2013 0828   GLUCOSE 177* 12/06/2013 0828   BUN 23 12/06/2013 0828   CREATININE 0.81 12/06/2013 0828   CALCIUM 9.4 12/06/2013 0828   PROT 6.8 12/06/2013 0828   AST 13 12/06/2013 0828   ALT 12 12/06/2013 0828   ALKPHOS 46 12/06/2013 0828   BILITOT 0.4 12/06/2013 0828   GFRNONAA 89 12/06/2013 0828   GFRAA 103  12/06/2013 0828   Lab Results  Component Value Date   HGBA1C 7.1* 08/22/2013    Assessment/plan  1. Hypertension, benign bp well controlled. contnue amlodipine with coreg and liisnopril and maxzide.  Continue aspirin  2. Type II or unspecified type diabetes mellitus with renal manifestations, uncontrolled Check a1c today. Continue metformin and will change her glucotrol to 5 mg once a day. Monitor blood sugar. cotninue asa, acei and statin - Hemoglobin A1c  3. CKD (chronic kidney disease) In setting of HTN and DM. Monitor clinically  4. Hyperlipidemia Continue statin and monitor lipid panel.    5. Obesity, morbid Weight reduction, dietary counselling

## 2013-12-11 LAB — HEMOGLOBIN A1C
Est. average glucose Bld gHb Est-mCnc: 177 mg/dL
Hgb A1c MFr Bld: 7.8 % — ABNORMAL HIGH (ref 4.8–5.6)

## 2013-12-12 ENCOUNTER — Other Ambulatory Visit: Payer: Self-pay | Admitting: Internal Medicine

## 2013-12-12 DIAGNOSIS — E1129 Type 2 diabetes mellitus with other diabetic kidney complication: Secondary | ICD-10-CM

## 2013-12-30 ENCOUNTER — Other Ambulatory Visit: Payer: Self-pay | Admitting: Internal Medicine

## 2014-01-30 ENCOUNTER — Encounter: Payer: Self-pay | Admitting: Internal Medicine

## 2014-01-30 ENCOUNTER — Ambulatory Visit (INDEPENDENT_AMBULATORY_CARE_PROVIDER_SITE_OTHER): Payer: Medicare Other | Admitting: Internal Medicine

## 2014-01-30 VITALS — BP 120/80 | HR 101 | Temp 97.6°F | Wt 261.0 lb

## 2014-01-30 DIAGNOSIS — I499 Cardiac arrhythmia, unspecified: Secondary | ICD-10-CM | POA: Diagnosis not present

## 2014-01-30 DIAGNOSIS — I4891 Unspecified atrial fibrillation: Secondary | ICD-10-CM

## 2014-01-30 DIAGNOSIS — R0989 Other specified symptoms and signs involving the circulatory and respiratory systems: Secondary | ICD-10-CM

## 2014-01-30 DIAGNOSIS — I10 Essential (primary) hypertension: Secondary | ICD-10-CM | POA: Diagnosis not present

## 2014-01-30 DIAGNOSIS — R809 Proteinuria, unspecified: Secondary | ICD-10-CM | POA: Diagnosis not present

## 2014-01-30 MED ORDER — ASPIRIN EC 325 MG PO TBEC: 325.0000 mg | DELAYED_RELEASE_TABLET | Freq: Every day | ORAL | Status: DC

## 2014-01-30 MED ORDER — DILTIAZEM HCL 30 MG PO TABS
30.0000 mg | ORAL_TABLET | Freq: Four times a day (QID) | ORAL | Status: DC
Start: 2014-01-30 — End: 2014-02-12

## 2014-01-30 NOTE — Progress Notes (Signed)
Patient ID: Seth [name], male   DOB: 23-Feb-1941, 73 y.o.   MRN: [id number]   Location:  [facility]   No Known Allergies  Chief Complaint  Patient presents with  . Acute Visit    abnormal pulse, per Bubba Camp need EKG    HPI: Patient is a 73 y.o. male seen in the office today for acute visit.  He was sent from his nephrologist (Dr. Tammi Klippel) here due to rapid irregular pulse.  Dr. Bubba Camp was called and referred him here for an EKG which shows afib with RVR at 104.  Pulse was anywhere from 114-120 and irregular.  Brand new problem. Asymptomatic.  Gained 21 lbs in 6 mos--had been losing and down to 234 and now 261.  Is tired going up and down the stairs.  Had flu a few wks ago so cough with that--had that now about 3 wks ago.  Says wife complains of his loud breathing all of the time.  Not feeling sob.  No chest pain.  Has not been out of medications.  Has never seen a cardiologist.  BP runs 120s-150s typically.  HR is normally 60s-70s upon review of past records.  Has had GI bleed from an ulcer when he was young.  Currently taking asa 325mg  twice weekly.  Records also show h/o PVD, CAD.  Review of Systems:  Review of Systems  Constitutional: Positive for malaise/fatigue.  Respiratory: Positive for cough. Negative for shortness of breath and wheezing.   Cardiovascular: Positive for leg swelling. Negative for chest pain and palpitations.  Genitourinary: Negative for frequency.  Neurological: Positive for weakness.  Endo/Heme/Allergies: Does not bruise/bleed easily.    Past Medical History  Diagnosis Date  . Impotence of organic origin   . Rash and other nonspecific skin eruption   . Hypertensive renal disease, benign   . Overweight   . Type II or unspecified type diabetes mellitus with renal manifestations, uncontrolled   . Peripheral vascular disease, unspecified   . Essential hypertension, benign   . Hemorrhage of rectum and anus   . Malignant  neoplasm of prostate   . Thrombocytopenia, unspecified   . Tobacco use disorder   . Proteinuria   . Personal history of malignant neoplasm of prostate   . Personal history of urinary calculi   . Other abnormal blood chemistry   . Other chest pain   . Slowing of urinary stream   . Encounter for long-term (current) use of other medications   . Rash and other nonspecific skin eruption   . Hemorrhage of gastrointestinal tract, unspecified   . Coronary atherosclerosis of unspecified type of vessel, native or graft   . Other and unspecified hyperlipidemia   . Calculus of kidney     Past Surgical History  Procedure Laterality Date  . Back surgery    . Cataract extraction  2010    Social History:   reports that he has been smoking.  His smokeless tobacco use includes Chew. He reports that he does not drink alcohol or use illicit drugs.  Family History  Problem Relation Age of Onset  . Cancer Mother 38    lukemia  . Heart disease Father     Medications: Patient's Medications  New Prescriptions   No medications on file  Previous Medications   AMLODIPINE BESYLATE PO    Take 10 mg by mouth daily. For blood pressure.     ASPIRIN 81 MG TABLET    Take 81  mg by mouth every other day.     ATORVASTATIN (LIPITOR) 10 MG TABLET    Take 1 tablet (10 mg total) by mouth daily.   CALCIUM CARBONATE-VITAMIN D 600-200 MG-UNIT TABS    Take 1 tablet by mouth daily. For bones    CARVEDILOL (COREG) 25 MG TABLET    Take 25 mg by mouth 2 (two) times daily with a meal. Take 1 and 1/2 tablet twice a day for blood pressure   FLAXSEED, LINSEED, (FLAXSEED OIL PO)    Take by mouth daily.     GARLIC PO    Take by mouth daily.     GLIPIZIDE (GLUCOTROL XL) 5 MG 24 HR TABLET    Take 1 tablet once a day for blood sugar.   GLUCOSE BLOOD (ACCU-CHEK AVIVA PLUS) TEST STRIP    1 each by Other route as needed for other. Use to check blood sugar twice a day Dx 250.02   LANCETS MISC    by Does not apply route. Use to  check blood sugars twice a day(aviva)  Dx 250.02   LISINOPRIL (PRINIVIL,ZESTRIL) 40 MG TABLET    Take 40 mg by mouth daily. To control blood pressure    METFORMIN (GLUCOPHAGE) 1000 MG TABLET    TAKE 1 TABLET BY MOUTH TWICE A DAY FOR DIABETES   MULTIPLE VITAMIN (MULTIVITAMINS PO)    Take 1 tablet by mouth daily.     TRIAMTERENE-HYDROCHLOROTHIAZIDE (MAXZIDE-25) 37.5-25 MG PER TABLET    Take 1 tablet by mouth daily.  Modified Medications   No medications on file  Discontinued Medications   No medications on file     Physical Exam: Filed Vitals:   01/30/14 1027  BP: 120/80  Pulse: 101  Temp: 97.6 F (36.4 C)  TempSrc: Oral  Weight: 261 lb (118.389 kg)  Physical Exam  Constitutional: He is oriented to person, place, and time.  Obese white male, breathing is audible across the room  HENT:  Head: Normocephalic and atraumatic.  Neck: No JVD present.  Cardiovascular:  irreg irreg, no m/g/r; pitting edema 1+ bilateral LE  Pulmonary/Chest: Effort normal and breath sounds normal. He has no rales.  Abdominal: Soft. Bowel sounds are normal. He exhibits no distension and no mass. There is no tenderness.  No ascites  Musculoskeletal: Normal range of motion. He exhibits no tenderness.  Neurological: He is alert and oriented to person, place, and time.  Skin: Skin is warm and dry.  Psychiatric: He has a normal mood and affect.     Labs reviewed: Basic Metabolic Panel:  Recent Labs  05/09/13 0914 08/22/13 0835 12/06/13 0828  NA 140 143 143  K 3.9 3.7 3.8  CL 101 103 103  CO2 24 24 23   GLUCOSE 148* 171* 177*  BUN 21 23 23   CREATININE 1.00 1.02 0.81  CALCIUM 9.6 9.5 9.4  TSH  --  1.890  --    Liver Function Tests:  Recent Labs  05/09/13 0914 12/06/13 0828  AST 17 13  ALT 17 12  ALKPHOS 46 46  BILITOT 0.6 0.4  PROT 6.7 6.8   No results found for this basename: LIPASE, AMYLASE,  in the last 8760 hours No results found for this basename: AMMONIA,  in the last 8760  hours CBC: No results found for this basename: WBC, NEUTROABS, HGB, HCT, MCV, PLT,  in the last 8760 hours Lipid Panel:  Recent Labs  05/09/13 0914 12/06/13 0828  HDL 37* 40  LDLCALC 76 48  TRIG  185* 81  CHOLHDL 4.1 2.6   Lab Results  Component Value Date   HGBA1C 7.8* 12/10/2013    Assessment/Plan 1. Pulse irregularity - Electrocardiogram was performed and revealed afib with RVR at 104 -pt's only symptom is being rundown/tired and what he perceived as deconditioning due to weight gain over the past 6 mos -has not felt well since he had "the flu" 3 weeks ago -CHADS-vasc2 appears to be at least 3 at this point, but has GI bleeding history  2. Atrial fibrillation with rapid ventricular response -already on high dose coreg 37.5mg , but rate not controlled today -will add low dose of short acting diltiazem and full dose asa daily at least until seen by cardiology asap for echo and workup, possible cardioversion if they anticoagulate him for a month first--discussed with pt and explained what afib is and risk of stroke -also reviewed plan with Dr. Bubba Camp, his PCP, by phone - diltiazem (CARDIZEM) 30 MG tablet; Take 1 tablet (30 mg total) by mouth 4 (four) times daily.  Dispense: 120 tablet; Refill: 0 - aspirin EC 325 MG tablet; Take 1 tablet (325 mg total) by mouth daily.  Dispense: 30 tablet; Refill: 3 - Ambulatory referral to [facility]--EKGs will be sent   Labs/tests ordered:   Orders Placed This Encounter  Procedures  . Ambulatory referral to Cardiology    Referral Priority:  Routine    Referral Type:  Consultation    Referral Reason:  Specialty Services Required    Requested Specialty:  Cardiology    Number of Visits Requested:  1  . Electrocardiogram report   Next appt:  1 month f/u with Dr. Bubba Camp after cardiology eval and tx

## 2014-02-12 ENCOUNTER — Ambulatory Visit (INDEPENDENT_AMBULATORY_CARE_PROVIDER_SITE_OTHER): Payer: Medicare Other | Admitting: Cardiovascular Disease

## 2014-02-12 ENCOUNTER — Encounter: Payer: Self-pay | Admitting: Cardiovascular Disease

## 2014-02-12 VITALS — BP 132/62 | HR 63 | Ht 70.0 in | Wt 262.0 lb

## 2014-02-12 DIAGNOSIS — R5383 Other fatigue: Secondary | ICD-10-CM

## 2014-02-12 DIAGNOSIS — Z8546 Personal history of malignant neoplasm of prostate: Secondary | ICD-10-CM | POA: Diagnosis not present

## 2014-02-12 DIAGNOSIS — R5381 Other malaise: Secondary | ICD-10-CM

## 2014-02-12 DIAGNOSIS — R0989 Other specified symptoms and signs involving the circulatory and respiratory systems: Secondary | ICD-10-CM

## 2014-02-12 DIAGNOSIS — R0609 Other forms of dyspnea: Secondary | ICD-10-CM

## 2014-02-12 DIAGNOSIS — I4891 Unspecified atrial fibrillation: Secondary | ICD-10-CM | POA: Diagnosis not present

## 2014-02-12 DIAGNOSIS — R0600 Dyspnea, unspecified: Secondary | ICD-10-CM

## 2014-02-12 MED ORDER — RIVAROXABAN 20 MG PO TABS: 20.0000 mg | ORAL_TABLET | Freq: Every day | ORAL | Status: DC

## 2014-02-12 MED ORDER — DILTIAZEM HCL ER COATED BEADS 120 MG PO CP24: 120.0000 mg | ORAL_CAPSULE | Freq: Every day | ORAL | Status: DC

## 2014-02-12 NOTE — Addendum Note (Signed)
Addended by: Thompson Grayer on: 02/12/2014 05:47 PM   Modules accepted: Orders

## 2014-02-12 NOTE — Patient Instructions (Signed)
Your physician recommends that you schedule a follow-up appointment in:  2-3 weeks.   Your physician has requested that you have an echocardiogram. Echocardiography is a painless test that uses sound waves to create images of your heart. It provides your doctor with information about the size and shape of your heart and how well your heart's chambers and valves are working. This procedure takes approximately one hour. There are no restrictions for this procedure.   Your physician has requested that you have a lexiscan myoview. For further information please visit [email or web address]. Please follow instruction sheet, as given.   Your physician has recommended you make the following change in your medication: Stop Aspirin. Stop Cardizem.  Start Cardizem CD 120 mg by mouth daily.  Start Xarelto 20 mg by mouth daily with dinner.

## 2014-02-12 NOTE — Progress Notes (Signed)
History of Present Illness: 73 yo male with history of DM, HTN, HLD, prostate cancer, obesity, renal insufficiency, new onset atrial fibrillation here today as a new patient for evaluation of atrial fibrillation, dyspnea and fatigue. His wife is an Therapist, sports at [facility] [employer] [employer] and gives most of the history. He has had a 3 month decline in his breathing. He is now dyspneic with minimal exertion. No chest pain. He cannot lie flat at night. He does snore. He has never been a smoker. No recent sick contacts. Also noted to be in atrial fib when seen in Nephrology and in primary care. He was started on ASA daily and diltiazem Q 6hours. No prior cardiac disease.   Primary Care Physician: Blanchie Serve  Last Lipid Profile:Lipid Panel     Component Value Date/Time   TRIG 81 12/06/2013 0828   HDL 40 12/06/2013 0828   CHOLHDL 2.6 12/06/2013 0828   LDLCALC 48 12/06/2013 0828   Past Medical History  Diagnosis Date  . Impotence of organic origin   . Rash and other nonspecific skin eruption   . Hypertensive renal disease, benign   . Overweight   . Type II or unspecified type diabetes mellitus with renal manifestations, uncontrolled   . Peripheral vascular disease, unspecified   . Essential hypertension, benign   . Hemorrhage of rectum and anus   . Malignant neoplasm of prostate   . Thrombocytopenia, unspecified   . Tobacco use disorder   . Proteinuria   . Personal history of malignant neoplasm of prostate   . Personal history of urinary calculi   . Other abnormal blood chemistry   . Other chest pain   . Slowing of urinary stream   . Encounter for long-term (current) use of other medications   . Rash and other nonspecific skin eruption   . Hemorrhage of gastrointestinal tract, unspecified   . Other and unspecified hyperlipidemia   . Calculus of kidney     Past Surgical History  Procedure Laterality Date  . Back surgery    . Cataract extraction  2010    Current Outpatient Prescriptions    Medication Sig Dispense Refill  . AMLODIPINE BESYLATE PO Take 10 mg by mouth daily. For blood pressure.        [name] atorvastatin (LIPITOR) 10 MG tablet Take 1 tablet (10 mg total) by mouth daily.  90 tablet  3  . Calcium Carbonate-Vitamin D 600-200 MG-UNIT TABS Take 1 tablet by mouth daily. For bones       . carvedilol (COREG) 25 MG tablet Take 25 mg by mouth 2 (two) times daily with a meal. Take 1 and 1/2 tablet twice a day for blood pressure      . Flaxseed, Linseed, (FLAXSEED OIL PO) Take by mouth daily.        [name] GARLIC PO Take by mouth daily.        [name] glipiZIDE (GLUCOTROL XL) 5 MG 24 hr tablet Take 1 tablet once a day for blood sugar.  30 tablet  3  . glucose blood (ACCU-CHEK AVIVA PLUS) test strip 1 each by Other route as needed for other. Use to check blood sugar twice a day Dx 250.02      . Lancets MISC by Does not apply route. Use to check blood sugars twice a day(aviva)  Dx 250.02      . lisinopril (PRINIVIL,ZESTRIL) 40 MG tablet Take 40 mg by mouth daily. To control blood pressure       .  metFORMIN (GLUCOPHAGE) 1000 MG tablet TAKE 1 TABLET BY MOUTH TWICE A DAY FOR DIABETES  60 tablet  5  . Multiple Vitamin (MULTIVITAMINS PO) Take 1 tablet by mouth daily.        [name] triamterene-hydrochlorothiazide (MAXZIDE-25) 37.5-25 MG per tablet Take 1 tablet by mouth daily.  90 tablet  3  . diltiazem (CARDIZEM CD) 120 MG 24 hr capsule Take 1 capsule (120 mg total) by mouth daily.  90 capsule  2  . Rivaroxaban (XARELTO) 20 MG TABS tablet Take 1 tablet (20 mg total) by mouth daily with supper.  90 tablet  2   No current facility-administered medications for this visit.    No Known Allergies  History   Social History  . Marital Status: Married    Spouse Name: N/A    Number of Children: 3  . Years of Education: N/A   Occupational History  . Sell storage buildings    Social History Main Topics  . Smoking status: Never Smoker   . Smokeless tobacco: Current User    Types: Chew  . Alcohol Use:  No  . Drug Use: No  . Sexual Activity: Not on file   Other Topics Concern  . Not on file   Social History Narrative  . No narrative on file    Family History  Problem Relation Age of Onset  . Cancer Mother 32    leukemia  . Heart disease Father   . Heart attack Father 70    Review of Systems:  As stated in the HPI and otherwise negative.   BP 132/62  Pulse 63  Ht 5\' 10"  (1.778 m)  Wt 262 lb (118.842 kg)  BMI 37.59 kg/m2  Physical Examination: General: Well developed, well nourished, NAD HEENT: OP clear, mucus membranes moist SKIN: warm, dry. No rashes. Neuro: No focal deficits Musculoskeletal: Muscle strength 5/5 all ext Psychiatric: Mood and affect normal Neck: No JVD, no carotid bruits, no thyromegaly, no lymphadenopathy. Lungs:Clear bilaterally, no wheezes, rhonci, crackles Cardiovascular: Regular rate and rhythm. No murmurs, gallops or rubs. Abdomen:Soft. Bowel sounds present. Non-tender.  Extremities: Trace bilateral lower extremity edema. Pulses are 2 + in the bilateral DP/PT.  EKG: NSR, rate 63 bpm. Poor R wave progression precordial leads.   Assessment and Plan:   1. Atrial fibrillation: He is in sinus today. Will change diltiazem to Cardizem CD 120 mg po Qdaily. His CHADS-VASC score is 3, high risk for embolic event. Will begin Xarelto 20 mg po Qdaily. His renal function is normal by most recent testing. Will check CBC, BMET and TSH today.   2. Dyspnea/fatigue: Will arrange echo to assess LVEF and exclude valvular heart disease. Will arrange Lexiscan stress myoview to exclude ischemia. He is obese and very inactive. I suspect he also has sleep apnea. He may have mild volume overload but he and his wife do not want to change diuretics at this time. He is on HCTZ as part of Maxzide combination pill. I would suggest Lasix and stopping HCTZ. Low suspicion for chronic PE but may need to consider CTA chest if cardiac testing is normal. He may also need PFTs.

## 2014-02-13 ENCOUNTER — Ambulatory Visit (HOSPITAL_COMMUNITY): Payer: Medicare Other | Attending: Internal Medicine | Admitting: Radiology

## 2014-02-13 VITALS — BP 121/61 | HR 55 | Ht 70.0 in | Wt 255.0 lb

## 2014-02-13 DIAGNOSIS — R0602 Shortness of breath: Secondary | ICD-10-CM

## 2014-02-13 DIAGNOSIS — I739 Peripheral vascular disease, unspecified: Secondary | ICD-10-CM | POA: Insufficient documentation

## 2014-02-13 DIAGNOSIS — E119 Type 2 diabetes mellitus without complications: Secondary | ICD-10-CM | POA: Insufficient documentation

## 2014-02-13 DIAGNOSIS — N189 Chronic kidney disease, unspecified: Secondary | ICD-10-CM | POA: Diagnosis not present

## 2014-02-13 DIAGNOSIS — I4891 Unspecified atrial fibrillation: Secondary | ICD-10-CM | POA: Insufficient documentation

## 2014-02-13 DIAGNOSIS — R0609 Other forms of dyspnea: Secondary | ICD-10-CM | POA: Diagnosis not present

## 2014-02-13 DIAGNOSIS — R0989 Other specified symptoms and signs involving the circulatory and respiratory systems: Secondary | ICD-10-CM | POA: Insufficient documentation

## 2014-02-13 DIAGNOSIS — Z8249 Family history of ischemic heart disease and other diseases of the circulatory system: Secondary | ICD-10-CM | POA: Diagnosis not present

## 2014-02-13 DIAGNOSIS — I129 Hypertensive chronic kidney disease with stage 1 through stage 4 chronic kidney disease, or unspecified chronic kidney disease: Secondary | ICD-10-CM | POA: Insufficient documentation

## 2014-02-13 DIAGNOSIS — F17200 Nicotine dependence, unspecified, uncomplicated: Secondary | ICD-10-CM | POA: Diagnosis not present

## 2014-02-13 DIAGNOSIS — R0600 Dyspnea, unspecified: Secondary | ICD-10-CM

## 2014-02-13 LAB — CBC WITH DIFFERENTIAL/PLATELET
Basophils Absolute: 0.1 10*3/uL (ref 0.0–0.1)
Basophils Relative: 0.6 % (ref 0.0–3.0)
Eosinophils Absolute: 0.3 10*3/uL (ref 0.0–0.7)
Eosinophils Relative: 2.4 % (ref 0.0–5.0)
HCT: 38.6 % — ABNORMAL LOW (ref 39.0–52.0)
Hemoglobin: 12.3 g/dL — ABNORMAL LOW (ref 13.0–17.0)
Lymphocytes Relative: 19.2 % (ref 12.0–46.0)
Lymphs Abs: 2.1 10*3/uL (ref 0.7–4.0)
MCHC: 31.8 g/dL (ref 30.0–36.0)
MCV: 87.7 fl (ref 78.0–100.0)
Monocytes Absolute: 0.9 10*3/uL (ref 0.1–1.0)
Monocytes Relative: 8.5 % (ref 3.0–12.0)
Neutro Abs: 7.5 10*3/uL (ref 1.4–7.7)
Neutrophils Relative %: 69.3 % (ref 43.0–77.0)
Platelets: 167 10*3/uL (ref 150.0–400.0)
RBC: 4.4 Mil/uL (ref 4.22–5.81)
RDW: 14.5 % (ref 11.5–14.6)
WBC: 10.9 10*3/uL — ABNORMAL HIGH (ref 4.5–10.5)

## 2014-02-13 LAB — BASIC METABOLIC PANEL
BUN: 26 mg/dL — ABNORMAL HIGH (ref 6–23)
CO2: 25 mEq/L (ref 19–32)
Calcium: 9.5 mg/dL (ref 8.4–10.5)
Chloride: 103 mEq/L (ref 96–112)
Creatinine, Ser: 0.9 mg/dL (ref 0.4–1.5)
GFR: 86.04 mL/min (ref >60.00)
Glucose, Bld: 172 mg/dL — ABNORMAL HIGH (ref 70–99)
Potassium: 3.7 mEq/L (ref 3.5–5.1)
Sodium: 137 mEq/L (ref 135–145)

## 2014-02-13 LAB — TSH: TSH: 1.79 u[IU]/mL (ref 0.35–5.50)

## 2014-02-13 MED ORDER — TECHNETIUM TC 99M SESTAMIBI GENERIC - CARDIOLITE
30.0000 | Freq: Once | INTRAVENOUS | Status: AC | PRN
  Administered 2014-02-13: 30 via INTRAVENOUS

## 2014-02-13 MED ORDER — TECHNETIUM TC 99M SESTAMIBI GENERIC - CARDIOLITE
10.0000 | Freq: Once | INTRAVENOUS | Status: AC | PRN
  Administered 2014-02-13: 10 via INTRAVENOUS

## 2014-02-13 MED ORDER — REGADENOSON 0.4 MG/5ML IV SOLN
0.4000 mg | Freq: Once | INTRAVENOUS | Status: AC
  Administered 2014-02-13: 0.4 mg via INTRAVENOUS

## 2014-02-13 NOTE — Progress Notes (Signed)
[name] [address] [place], [place] [address] [phone]    Cardiology Nuclear Med Study  Seth [name] is a 73 y.o. male     MRN : [id number]     DOB: 30-Oct-1941  Procedure Date: 02/13/2014  Nuclear Med Background Indication for Stress Test:  Evaluation for Ischemia and New onset AFIB  History:  No known CAD, Afib, CKD Cardiac Risk Factors: Family History - CAD, Hypertension, NIDDM, PVD and Chews Tobacco  Symptoms:  DOE   Nuclear Pre-Procedure Caffeine/Decaff Intake:  None > 12 hrs NPO After: 5:30pm   Lungs:  clear O2 Sat: 92% on room air. IV 0.9% NS with Angio Cath:  22g  IV Site: R Antecubital x 1, tolerated well IV Started by:  Seth Baltimore, RN  Chest Size (in):  46 Cup Size: n/a  Height: 5\' 10"  (1.778 m)  Weight:  255 lb (115.667 kg)  BMI:  Body mass index is 36.59 kg/(m^2). Tech Comments:  Took Coreg this am. Held Glucotrol and Glucophage this am.     Nuclear Med Study 1 or 2 day study: 1 day  Stress Test Type:  Lexiscan  Reading MD: N/A  Order Authorizing Provider:  Lauree Chandler, MD  Resting Radionuclide: Technetium 48m Sestamibi  Resting Radionuclide Dose: 11.0 mCi   Stress Radionuclide:  Technetium 43m Sestamibi  Stress Radionuclide Dose: 33.0 mCi           Stress Protocol Rest HR: 55 Stress HR: 71  Rest BP: 121/61 Stress BP: 101/67  Exercise Time (min): n/a METS: n/a           Dose of Adenosine (mg):  n/a Dose of Lexiscan: 0.4 mg  Dose of Atropine (mg): n/a Dose of Dobutamine: n/a mcg/kg/min (at max HR)  Stress Test Technologist: Seth [name], BS-ES  Nuclear Technologist:  Seth [name], CNMT     Rest Procedure:  Myocardial perfusion imaging was performed at rest 45 minutes following the intravenous administration of Technetium 75m Sestamibi. Rest ECG: Normal sinus rhythm. Old anterior MI.  Stress Procedure:  The patient received IV Lexiscan 0.4 mg over 15-seconds.  Technetium 75m Sestamibi injected at  30-seconds.  Quantitative spect images were obtained after a 45 minute delay.  During the infusion of Lexiscan, the patient complained of SOB.  This resolved in recovery.  Stress ECG: No significant change from baseline ECG  QPS Raw Data Images:  Normal; no motion artifact; normal heart/lung ratio. Stress Images:  There is a large area of moderately severe decreased uptake affecting the apical septal segment, apical anterior segment, apical lateral segment, apical cap, base/mid inferolateral segments, mid lateral segment. These areas are mostly fixed. There may be mild reversibility in the area of the apical lateral segment. Rest Images:  Rest images are very similar to the stress images other than mild change in the apical lateral segment. Subtraction (SDS):  Possibly mild ischemia in the apical lateral segment. Transient Ischemic Dilatation (Normal <1.22):  1.13 Lung/Heart Ratio (Normal <0.45):  0.57  Quantitative Gated Spect Images QGS EDV:  268 ml QGS ESV:  182 ml  Impression Exercise Capacity:  Lexiscan with no exercise. BP Response:  Normal blood pressure response. Clinical Symptoms:  Shortness of breath ECG Impression:  No significant ST segment change suggestive of ischemia. Comparison with Prior Nuclear Study: No images to compare  Overall Impression:  This is a high risk scan. There is significant left ventricular dysfunction. There is a large area of scar affecting  the apex, the entire inferolateral wall, and parts of the mid ventricle. There may be mild ischemia.  LV Ejection Fraction: 32%.  LV Wall Motion:  Severe hypokinesis of the mid segments and apical segments of the ventricle.  Seth Argyle, MD

## 2014-02-17 ENCOUNTER — Ambulatory Visit (HOSPITAL_COMMUNITY)
Admission: RE | Admit: 2014-02-17 | Discharge: 2014-02-17 | Disposition: A | Discharge status: Home / Self Care | Payer: Medicare Other | Source: Ambulatory Visit | Attending: Cardiovascular Disease | Admitting: Cardiovascular Disease

## 2014-02-17 DIAGNOSIS — E669 Obesity, unspecified: Secondary | ICD-10-CM | POA: Diagnosis not present

## 2014-02-17 DIAGNOSIS — E119 Type 2 diabetes mellitus without complications: Secondary | ICD-10-CM | POA: Insufficient documentation

## 2014-02-17 DIAGNOSIS — E785 Hyperlipidemia, unspecified: Secondary | ICD-10-CM | POA: Diagnosis not present

## 2014-02-17 DIAGNOSIS — R079 Chest pain, unspecified: Secondary | ICD-10-CM | POA: Insufficient documentation

## 2014-02-17 DIAGNOSIS — I10 Essential (primary) hypertension: Secondary | ICD-10-CM

## 2014-02-17 DIAGNOSIS — I2510 Atherosclerotic heart disease of native coronary artery without angina pectoris: Secondary | ICD-10-CM

## 2014-02-17 DIAGNOSIS — I517 Cardiomegaly: Secondary | ICD-10-CM

## 2014-02-17 DIAGNOSIS — N189 Chronic kidney disease, unspecified: Secondary | ICD-10-CM

## 2014-02-17 DIAGNOSIS — R0789 Other chest pain: Secondary | ICD-10-CM

## 2014-02-17 DIAGNOSIS — F17200 Nicotine dependence, unspecified, uncomplicated: Secondary | ICD-10-CM | POA: Diagnosis not present

## 2014-02-17 DIAGNOSIS — R0600 Dyspnea, unspecified: Secondary | ICD-10-CM

## 2014-02-17 DIAGNOSIS — I4891 Unspecified atrial fibrillation: Secondary | ICD-10-CM | POA: Diagnosis not present

## 2014-02-17 NOTE — Progress Notes (Signed)
2D echo performed 02-17-2014 JR

## 2014-02-19 ENCOUNTER — Encounter: Payer: Self-pay | Admitting: *Deleted

## 2014-02-19 ENCOUNTER — Encounter: Payer: Self-pay | Admitting: Physician Assistant

## 2014-02-19 ENCOUNTER — Encounter: Payer: Self-pay | Admitting: Internal Medicine

## 2014-02-19 ENCOUNTER — Telehealth: Payer: Self-pay | Admitting: Cardiovascular Disease

## 2014-02-19 ENCOUNTER — Ambulatory Visit (INDEPENDENT_AMBULATORY_CARE_PROVIDER_SITE_OTHER): Payer: Medicare Other | Admitting: Physician Assistant

## 2014-02-19 VITALS — BP 120/68 | HR 90 | Ht 70.0 in | Wt 259.0 lb

## 2014-02-19 DIAGNOSIS — I10 Essential (primary) hypertension: Secondary | ICD-10-CM

## 2014-02-19 DIAGNOSIS — I5023 Acute on chronic systolic (congestive) heart failure: Secondary | ICD-10-CM | POA: Diagnosis not present

## 2014-02-19 DIAGNOSIS — R079 Chest pain, unspecified: Secondary | ICD-10-CM

## 2014-02-19 DIAGNOSIS — E119 Type 2 diabetes mellitus without complications: Secondary | ICD-10-CM

## 2014-02-19 DIAGNOSIS — I428 Other cardiomyopathies: Secondary | ICD-10-CM

## 2014-02-19 DIAGNOSIS — Z8546 Personal history of malignant neoplasm of prostate: Secondary | ICD-10-CM | POA: Diagnosis not present

## 2014-02-19 DIAGNOSIS — R0602 Shortness of breath: Secondary | ICD-10-CM

## 2014-02-19 DIAGNOSIS — Z87442 Personal history of urinary calculi: Secondary | ICD-10-CM | POA: Diagnosis not present

## 2014-02-19 DIAGNOSIS — I4891 Unspecified atrial fibrillation: Secondary | ICD-10-CM | POA: Diagnosis not present

## 2014-02-19 DIAGNOSIS — I429 Cardiomyopathy, unspecified: Secondary | ICD-10-CM

## 2014-02-19 DIAGNOSIS — I2510 Atherosclerotic heart disease of native coronary artery without angina pectoris: Secondary | ICD-10-CM | POA: Diagnosis not present

## 2014-02-19 DIAGNOSIS — E291 Testicular hypofunction: Secondary | ICD-10-CM | POA: Diagnosis not present

## 2014-02-19 MED ORDER — NITROGLYCERIN 0.4 MG SL SUBL: 0.4000 mg | SUBLINGUAL_TABLET | SUBLINGUAL | Status: DC | PRN

## 2014-02-19 MED ORDER — POTASSIUM CHLORIDE CRYS ER 20 MEQ PO TBCR: 20.0000 meq | EXTENDED_RELEASE_TABLET | Freq: Every day | ORAL | Status: DC

## 2014-02-19 MED ORDER — FUROSEMIDE 40 MG PO TABS: 40.0000 mg | ORAL_TABLET | Freq: Every day | ORAL | Status: DC

## 2014-02-19 NOTE — H&P (Signed)
I have spoken with [name], PA-C about this patient, reviewed all pertinent details of the case and agree with the plan. Seth [name]

## 2014-02-19 NOTE — Progress Notes (Signed)
[address] [place], [place]  [address] Phone: [phone] Fax:  [phone]  Date:  02/19/2014   ID:  Seth [name], DOB [birth year], MRN [id number]  PCP:  Blanchie Serve, MD  Cardiologist:  Dr. Lauree Chandler     History of Present Illness: Seth [name] is a 73 y.o. male with a hx of DM, HTN, HL, prostate CA, renal insufficiency, obesity.  He was recently evaluated by Dr. Lauree Chandler for new onset AFib.  He was actually back in NSR when evaluated 02/12/14.  CHADS2-VASc=3 (DM, HTN, age > 64).  He was placed on Xarelto.  He was set up for echo and stress test.   Stress test was abnormal with apical and inferolateral scar and possible ischemia.  EF is 32%.  EF is 35-40% on echo with RWMA.  He is brought back today to discuss possible cardiac cath.  Lexiscan Myoview (02/14/14):  Apical, inferolateral scar; ? Mild ischemia; EF 32%. Echo (02/17/14):  EF 35-40%, anteroseptal and apical AK, Gr 2 DD, MAC, mod LAE.  He continues to have significant dyspnea with exertion. He describes NYHA class III symptoms. He is unable to tell me whether or not he is having significant chest discomfort with his dyspnea. He does not orthopnea as well as PND. He also has LE edema. He denies syncope.  Recent Labs: 12/06/2013: ALT 12; HDL Cholesterol 40; LDL (calc) 48  02/12/2014: Creatinine 0.9; Hemoglobin 12.3*; Potassium 3.7; TSH 1.79   Wt Readings from Last 3 Encounters:  02/19/14 259 lb (117.482 kg)  02/13/14 255 lb (115.667 kg)  02/12/14 262 lb (118.842 kg)     Past Medical History  Diagnosis Date  . Impotence of organic origin   . Rash and other nonspecific skin eruption   . Hypertensive renal disease, benign   . Overweight   . Type II or unspecified type diabetes mellitus with renal manifestations, uncontrolled   . Peripheral vascular disease, unspecified   . Essential hypertension, benign   . Hemorrhage of rectum and anus   . Malignant neoplasm of prostate   .  Thrombocytopenia, unspecified   . Tobacco use disorder   . Proteinuria   . Personal history of malignant neoplasm of prostate   . Personal history of urinary calculi   . Other abnormal blood chemistry   . Other chest pain   . Slowing of urinary stream   . Encounter for long-term (current) use of other medications   . Rash and other nonspecific skin eruption   . Hemorrhage of gastrointestinal tract, unspecified   . Other and unspecified hyperlipidemia   . Calculus of kidney     Current Outpatient Prescriptions  Medication Sig Dispense Refill  . AMLODIPINE BESYLATE PO Take 10 mg by mouth daily. For blood pressure.        [name] atorvastatin (LIPITOR) 10 MG tablet Take 1 tablet (10 mg total) by mouth daily.  90 tablet  3  . Calcium Carbonate-Vitamin D 600-200 MG-UNIT TABS Take 1 tablet by mouth daily. For bones       . carvedilol (COREG) 25 MG tablet Take 25 mg by mouth 2 (two) times daily with a meal. Take 1 and 1/2 tablet twice a day for blood pressure      . diltiazem (CARDIZEM CD) 120 MG 24 hr capsule Take 1 capsule (120 mg total) by mouth daily.  90 capsule  2  . Flaxseed, Linseed, (FLAXSEED OIL PO) Take by mouth daily.        [name]  GARLIC PO Take by mouth daily.        [name] glipiZIDE (GLUCOTROL XL) 5 MG 24 hr tablet Take 1 tablet once a day for blood sugar.  30 tablet  3  . glucose blood (ACCU-CHEK AVIVA PLUS) test strip 1 each by Other route as needed for other. Use to check blood sugar twice a day Dx 250.02      . Lancets MISC by Does not apply route. Use to check blood sugars twice a day(aviva)  Dx 250.02      . lisinopril (PRINIVIL,ZESTRIL) 40 MG tablet Take 40 mg by mouth daily. To control blood pressure       . metFORMIN (GLUCOPHAGE) 1000 MG tablet TAKE 1 TABLET BY MOUTH TWICE A DAY FOR DIABETES  60 tablet  5  . Multiple Vitamin (MULTIVITAMINS PO) Take 1 tablet by mouth daily.        . Rivaroxaban (XARELTO) 20 MG TABS tablet Take 1 tablet (20 mg total) by mouth daily with supper.  90  tablet  2  . triamterene-hydrochlorothiazide (MAXZIDE-25) 37.5-25 MG per tablet Take 1 tablet by mouth daily.  90 tablet  3   No current facility-administered medications for this visit.    Allergies:   Review of patient's allergies indicates no known allergies.   Social History:  The patient  reports that he has never smoked. His smokeless tobacco use includes Chew. He reports that he does not drink alcohol or use illicit drugs.   Family History:  The patient's family history includes Cancer (age of onset: 42) in his mother; Heart attack (age of onset: 14) in his father; Heart disease in his father.   ROS:  Please see the history of present illness.      All other systems reviewed and negative.   PHYSICAL EXAM: VS:  BP 120/68  Pulse 90  Ht 5\' 10"  (1.778 m)  Wt 259 lb (117.482 kg)  BMI 37.16 kg/m2 Well nourished, well developed, in no acute distress HEENT: normal Neck: + JVD Cardiac:  normal S1, S2; irregularly irregular rhythm; no murmur Lungs:  clear to auscultation bilaterally, no wheezing, rhonchi or rales Abd: soft, nontender, no hepatomegaly Ext: 1-2+ bilateral LE edema Skin: warm and dry Neuro:  CNs 2-12 intact, no focal abnormalities noted  EKG:  Atrial fibrillation, HR 90     ASSESSMENT AND PLAN:  1. Acute on Chronic Systolic CHF:  He is volume overloaded. His ejection fraction is 35%. 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 I have recommended discontinuing Maxzide. I will start him on Lasix 40 mg daily along with potassium 20 mEq daily. He needs a cardiac catheterization. Pre-cardiac catheterization labs will be obtained today. 2. 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 Cardiac catheterization has been recommended. I reviewed his case over the telephone with Dr. Lauree Chandler who agreed.  Cardiac catheterization will be arranged later this week.  I will provide him a Rx for NTG.  He knows  to go to the ED if he has worsening symptoms.  Continue beta blocker, ACEI.  Will need to eventually get him off of Diltiazem.   3. Atrial Fibrillation:  Rate controlled.  With reduced EF, will eventually need to get him off of Cardizem.  Start to hold Xarelto today for cardiac cath on Friday. 4. Hypertension:  Controlled.  5. Diabetes Mellitus:  Hold glipizide and Metformin for cath.  6. Disposition:  F/u with Dr. Lauree Chandler or me  after his cath.    Signed, [name], PA-C  02/19/2014 2:00 PM

## 2014-02-19 NOTE — Telephone Encounter (Signed)
Spoke with pt's wife and appointment made for pt to see [name], PA today at 2:40.  Pt has been started on Xarelto and this is too expensive.  Pt's wife would like to discuss changing to Coumadin.

## 2014-02-19 NOTE — H&P (Signed)
History and Physical  Date:  02/19/2014   ID:  Seth [name], DOB Mar 11, 1941, MRN [id number]  PCP:  Blanchie Serve, MD  Cardiologist:  Dr. Lauree Chandler     History of Present Illness: Seth [name] is a 73 y.o. male with a hx of DM, HTN, HL, prostate CA, renal insufficiency, obesity.  He was recently evaluated by Dr. Lauree Chandler for new onset AFib.  He was actually back in NSR when evaluated 02/12/14.  CHADS2-VASc=3 (DM, HTN, age > 70).  He was placed on Xarelto.  He was set up for echo and stress test.   Stress test was abnormal with apical and inferolateral scar and possible ischemia.  EF is 32%.  EF is 35-40% on echo with RWMA.  He is brought back today to discuss possible cardiac cath.  Lexiscan Myoview (02/14/14):  Apical, inferolateral scar; ? Mild ischemia; EF 32%. Echo (02/17/14):  EF 35-40%, anteroseptal and apical AK, Gr 2 DD, MAC, mod LAE.  He continues to have significant dyspnea with exertion. He describes NYHA class III symptoms. He is unable to tell me whether or not he is having significant chest discomfort with his dyspnea. He does not orthopnea as well as PND. He also has LE edema. He denies syncope.  Recent Labs: 12/06/2013: ALT 12; HDL Cholesterol 40; LDL (calc) 48  02/12/2014: Creatinine 0.9; Hemoglobin 12.3*; Potassium 3.7; TSH 1.79   Wt Readings from Last 3 Encounters:  02/19/14 259 lb (117.482 kg)  02/13/14 255 lb (115.667 kg)  02/12/14 262 lb (118.842 kg)     Past Medical History  Diagnosis Date  . Impotence of organic origin   . Rash and other nonspecific skin eruption   . Hypertensive renal disease, benign   . Overweight   . Type II or unspecified type diabetes mellitus with renal manifestations, uncontrolled   . Peripheral vascular disease, unspecified   . Essential hypertension, benign   . Hemorrhage of rectum and anus   . Malignant neoplasm of prostate   . Thrombocytopenia, unspecified   . Tobacco use disorder   . Proteinuria     . Personal history of malignant neoplasm of prostate   . Personal history of urinary calculi   . Other abnormal blood chemistry   . Other chest pain   . Slowing of urinary stream   . Encounter for long-term (current) use of other medications   . Rash and other nonspecific skin eruption   . Hemorrhage of gastrointestinal tract, unspecified   . Other and unspecified hyperlipidemia   . Calculus of kidney     Current Outpatient Prescriptions  Medication Sig Dispense Refill  . AMLODIPINE BESYLATE PO Take 10 mg by mouth daily. For blood pressure.        [name] atorvastatin (LIPITOR) 10 MG tablet Take 1 tablet (10 mg total) by mouth daily.  90 tablet  3  . Calcium Carbonate-Vitamin D 600-200 MG-UNIT TABS Take 1 tablet by mouth daily. For bones       . carvedilol (COREG) 25 MG tablet Take 25 mg by mouth 2 (two) times daily with a meal. Take 1 and 1/2 tablet twice a day for blood pressure      . diltiazem (CARDIZEM CD) 120 MG 24 hr capsule Take 1 capsule (120 mg total) by mouth daily.  90 capsule  2  . Flaxseed, Linseed, (FLAXSEED OIL PO) Take by mouth daily.        [name] GARLIC PO Take by mouth daily.        [name]  glipiZIDE (GLUCOTROL XL) 5 MG 24 hr tablet Take 1 tablet once a day for blood sugar.  30 tablet  3  . glucose blood (ACCU-CHEK AVIVA PLUS) test strip 1 each by Other route as needed for other. Use to check blood sugar twice a day Dx 250.02      . Lancets MISC by Does not apply route. Use to check blood sugars twice a day(aviva)  Dx 250.02      . lisinopril (PRINIVIL,ZESTRIL) 40 MG tablet Take 40 mg by mouth daily. To control blood pressure       . metFORMIN (GLUCOPHAGE) 1000 MG tablet TAKE 1 TABLET BY MOUTH TWICE A DAY FOR DIABETES  60 tablet  5  . Multiple Vitamin (MULTIVITAMINS PO) Take 1 tablet by mouth daily.        . Rivaroxaban (XARELTO) 20 MG TABS tablet Take 1 tablet (20 mg total) by mouth daily with supper.  90 tablet  2  . triamterene-hydrochlorothiazide (MAXZIDE-25) 37.5-25 MG per  tablet Take 1 tablet by mouth daily.  90 tablet  3   No current facility-administered medications for this visit.    Allergies:   Review of patient's allergies indicates no known allergies.   Social History:  The patient  reports that he has never smoked. His smokeless tobacco use includes Chew. He reports that he does not drink alcohol or use illicit drugs.   Family History:  The patient's family history includes Cancer (age of onset: 57) in his mother; Heart attack (age of onset: 60) in his father; Heart disease in his father.   ROS:  Please see the history of present illness.      All other systems reviewed and negative.   PHYSICAL EXAM: VS:  BP 120/68  Pulse 90  Ht 5\' 10"  (1.778 m)  Wt 259 lb (117.482 kg)  BMI 37.16 kg/m2 Well nourished, well developed, in no acute distress HEENT: normal Neck: + JVD Cardiac:  normal S1, S2; irregularly irregular rhythm; no murmur Lungs:  clear to auscultation bilaterally, no wheezing, rhonchi or rales Abd: soft, nontender, no hepatomegaly Ext: 1-2+ bilateral LE edema Skin: warm and dry Neuro:  CNs 2-12 intact, no focal abnormalities noted  EKG:  Atrial fibrillation, HR 90     ASSESSMENT AND PLAN:  1. Acute on Chronic Systolic CHF:  He is volume overloaded. His ejection fraction is 35%. 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 I have recommended discontinuing Maxzide. I will start him on Lasix 40 mg daily along with potassium 20 mEq daily. He needs a cardiac catheterization. Pre-cardiac catheterization labs will be obtained today. 2. 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 Cardiac catheterization has been recommended. I reviewed his case over the telephone with Dr. Lauree Chandler who agreed.  Cardiac catheterization will be arranged later this week.  I will provide him a Rx for NTG.  He knows to go to the ED if he has worsening symptoms.  Continue beta blocker,  ACEI.  Will need to eventually get him off of Diltiazem.   3. Atrial Fibrillation:  Rate controlled.  With reduced EF, will eventually need to get him off of Cardizem.  Start to hold Xarelto today for cardiac cath on Friday. 4. Hypertension:  Controlled.  5. Diabetes Mellitus:  Hold glipizide and Metformin for cath.  6. Disposition:  F/u with Dr. Lauree Chandler or me after his cath.    Signed, [name], PA-C  02/19/2014 2:00 PM

## 2014-02-19 NOTE — Telephone Encounter (Signed)
New Problem:  Pt wants to come in for a sooner appt... Pt wants a call back.

## 2014-02-19 NOTE — Telephone Encounter (Signed)
Thanks

## 2014-02-19 NOTE — Patient Instructions (Addendum)
START LASIX 40 MG DAILY START POTASSIUM 20 MEQ DAILY  MAKE SURE TO HOLD XARELTO UNTIL AFTER YOUR CATH;   YOU WILL TAKE AN 81 MG ASPIRIN THE DAY OF YOUR CATH  LAB WORK TODAY TO BE DONE AT [facility], BMET, CBC W/DIFF, PT/INR; YOU HAVE BEEN GIVEN AN RX TO GIVE THE LAB TECH;  MAKE SURE THEY CALL [phone] PAGER OR HIS CELL [phone] SCOTT WEAVER, [facility] WITH THE RESULTS ASAP SINCE YOUR CATH IS 02/21/14 @ 7:30 AM

## 2014-02-20 ENCOUNTER — Ambulatory Visit: Payer: Medicare Other | Admitting: Cardiovascular Disease

## 2014-02-20 ENCOUNTER — Encounter (HOSPITAL_COMMUNITY): Payer: Self-pay | Admitting: Pharmacy Technician

## 2014-02-21 ENCOUNTER — Other Ambulatory Visit: Payer: Self-pay | Admitting: *Deleted

## 2014-02-21 ENCOUNTER — Inpatient Hospital Stay (HOSPITAL_COMMUNITY)
Admission: RE | Admit: 2014-02-21 | Discharge: 2014-03-01 | DRG: 233 | Disposition: A | Discharge status: Home / Self Care | Payer: Medicare Other | Source: Ambulatory Visit | Attending: Surgery | Admitting: Surgery

## 2014-02-21 ENCOUNTER — Encounter (HOSPITAL_COMMUNITY)
Admission: RE | Disposition: A | Discharge status: Home / Self Care | Payer: Self-pay | Source: Ambulatory Visit | Attending: Surgery

## 2014-02-21 ENCOUNTER — Inpatient Hospital Stay (HOSPITAL_COMMUNITY): Payer: Medicare Other

## 2014-02-21 DIAGNOSIS — I2584 Coronary atherosclerosis due to calcified coronary lesion: Secondary | ICD-10-CM | POA: Diagnosis present

## 2014-02-21 DIAGNOSIS — J9819 Other pulmonary collapse: Secondary | ICD-10-CM | POA: Diagnosis not present

## 2014-02-21 DIAGNOSIS — I4891 Unspecified atrial fibrillation: Secondary | ICD-10-CM | POA: Diagnosis not present

## 2014-02-21 DIAGNOSIS — I2510 Atherosclerotic heart disease of native coronary artery without angina pectoris: Secondary | ICD-10-CM | POA: Diagnosis not present

## 2014-02-21 DIAGNOSIS — I5023 Acute on chronic systolic (congestive) heart failure: Secondary | ICD-10-CM | POA: Diagnosis present

## 2014-02-21 DIAGNOSIS — R609 Edema, unspecified: Secondary | ICD-10-CM

## 2014-02-21 DIAGNOSIS — Z951 Presence of aortocoronary bypass graft: Secondary | ICD-10-CM | POA: Diagnosis not present

## 2014-02-21 DIAGNOSIS — Z01818 Encounter for other preprocedural examination: Secondary | ICD-10-CM | POA: Diagnosis not present

## 2014-02-21 DIAGNOSIS — I480 Paroxysmal atrial fibrillation: Secondary | ICD-10-CM

## 2014-02-21 DIAGNOSIS — R079 Chest pain, unspecified: Secondary | ICD-10-CM | POA: Diagnosis not present

## 2014-02-21 DIAGNOSIS — Z8546 Personal history of malignant neoplasm of prostate: Secondary | ICD-10-CM

## 2014-02-21 DIAGNOSIS — I200 Unstable angina: Secondary | ICD-10-CM | POA: Diagnosis not present

## 2014-02-21 DIAGNOSIS — E669 Obesity, unspecified: Secondary | ICD-10-CM | POA: Diagnosis present

## 2014-02-21 DIAGNOSIS — Z79899 Other long term (current) drug therapy: Secondary | ICD-10-CM

## 2014-02-21 DIAGNOSIS — Z8249 Family history of ischemic heart disease and other diseases of the circulatory system: Secondary | ICD-10-CM | POA: Diagnosis not present

## 2014-02-21 DIAGNOSIS — D62 Acute posthemorrhagic anemia: Secondary | ICD-10-CM | POA: Diagnosis not present

## 2014-02-21 DIAGNOSIS — I129 Hypertensive chronic kidney disease with stage 1 through stage 4 chronic kidney disease, or unspecified chronic kidney disease: Secondary | ICD-10-CM | POA: Diagnosis present

## 2014-02-21 DIAGNOSIS — R0989 Other specified symptoms and signs involving the circulatory and respiratory systems: Secondary | ICD-10-CM | POA: Diagnosis not present

## 2014-02-21 DIAGNOSIS — E785 Hyperlipidemia, unspecified: Secondary | ICD-10-CM | POA: Diagnosis not present

## 2014-02-21 DIAGNOSIS — I2589 Other forms of chronic ischemic heart disease: Secondary | ICD-10-CM | POA: Diagnosis present

## 2014-02-21 DIAGNOSIS — E119 Type 2 diabetes mellitus without complications: Secondary | ICD-10-CM | POA: Diagnosis present

## 2014-02-21 DIAGNOSIS — I739 Peripheral vascular disease, unspecified: Secondary | ICD-10-CM | POA: Diagnosis present

## 2014-02-21 DIAGNOSIS — Z452 Encounter for adjustment and management of vascular access device: Secondary | ICD-10-CM | POA: Diagnosis not present

## 2014-02-21 DIAGNOSIS — F17200 Nicotine dependence, unspecified, uncomplicated: Secondary | ICD-10-CM | POA: Diagnosis present

## 2014-02-21 DIAGNOSIS — I10 Essential (primary) hypertension: Secondary | ICD-10-CM

## 2014-02-21 DIAGNOSIS — N189 Chronic kidney disease, unspecified: Secondary | ICD-10-CM | POA: Diagnosis present

## 2014-02-21 DIAGNOSIS — Z01810 Encounter for preprocedural cardiovascular examination: Secondary | ICD-10-CM | POA: Diagnosis not present

## 2014-02-21 HISTORY — PX: LEFT HEART CATHETERIZATION WITH CORONARY ANGIOGRAM: SHX5451

## 2014-02-21 LAB — PULMONARY FUNCTION TEST
FEF 25-75 Pre: 1.12 L/sec
FEF2575-%Pred-Pre: 47 %
FEV1-%Change-Post: -26 %
FEV1-%Pred-Post: 30 %
FEV1-%Pred-Pre: 41 %
FEV1-Post: 0.98 L
FEV1-Pre: 1.33 L
FEV1FVC-%Change-Post: 0 %
FEV1FVC-%Pred-Pre: 106 %
FEV6-%Change-Post: -25 %
FEV6-%Pred-Post: 30 %
FEV6-%Pred-Pre: 41 %
FEV6-Post: 1.27 L
FEV6-Pre: 1.71 L
FEV6FVC-%Change-Post: 0 %
FEV6FVC-%Pred-Post: 106 %
FEV6FVC-%Pred-Pre: 106 %
FVC-%Change-Post: -25 %
FVC-%Pred-Post: 29 %
FVC-%Pred-Pre: 39 %
FVC-Post: 1.27 L
FVC-Pre: 1.71 L
Post FEV1/FVC ratio: 78 %
Post FEV6/FVC ratio: 100 %
Pre FEV1/FVC ratio: 78 %
Pre FEV6/FVC Ratio: 100 %

## 2014-02-21 LAB — PROTIME-INR
INR: 1.01 (ref 0.00–1.49)
Prothrombin Time: 13.1 seconds (ref 11.6–15.2)

## 2014-02-21 LAB — GLUCOSE, CAPILLARY
Glucose-Capillary: 199 mg/dL — ABNORMAL HIGH (ref 70–99)
Glucose-Capillary: 190 mg/dL — ABNORMAL HIGH (ref 70–99)
Glucose-Capillary: 127 mg/dL — ABNORMAL HIGH (ref 70–99)

## 2014-02-21 LAB — CBC
HCT: 38 % — ABNORMAL LOW (ref 39.0–52.0)
Hemoglobin: 12.4 g/dL — ABNORMAL LOW (ref 13.0–17.0)
MCH: 28 pg (ref 26.0–34.0)
MCHC: 32.6 g/dL (ref 30.0–36.0)
MCV: 85.8 fL (ref 78.0–100.0)
Platelets: 136 10*3/uL — ABNORMAL LOW (ref 150–400)
RBC: 4.43 MIL/uL (ref 4.22–5.81)
RDW: 13.9 % (ref 11.5–15.5)
WBC: 8.6 10*3/uL (ref 4.0–10.5)

## 2014-02-21 SURGERY — LEFT HEART CATHETERIZATION WITH CORONARY ANGIOGRAM
Anesthesia: LOCAL

## 2014-02-21 MED ORDER — POTASSIUM CHLORIDE CRYS ER 20 MEQ PO TBCR
20.0000 meq | EXTENDED_RELEASE_TABLET | Freq: Every day | ORAL | Status: DC
  Administered 2014-02-21 – 2014-02-23 (×3): 20 meq via ORAL
  Filled 2014-02-21 (×4): qty 1

## 2014-02-21 MED ORDER — HEPARIN (PORCINE) IN NACL 2-0.9 UNIT/ML-% IJ SOLN
INTRAMUSCULAR | Status: AC
  Filled 2014-02-21: qty 1000

## 2014-02-21 MED ORDER — CARVEDILOL 25 MG PO TABS
37.5000 mg | ORAL_TABLET | Freq: Two times a day (BID) | ORAL | Status: DC
  Administered 2014-02-21 – 2014-02-23 (×5): 37.5 mg via ORAL
  Filled 2014-02-21 (×8): qty 1

## 2014-02-21 MED ORDER — ATORVASTATIN CALCIUM 10 MG PO TABS
10.0000 mg | ORAL_TABLET | Freq: Every day | ORAL | Status: DC
  Administered 2014-02-22 – 2014-03-01 (×7): 10 mg via ORAL
  Filled 2014-02-21 (×8): qty 1

## 2014-02-21 MED ORDER — LIDOCAINE HCL (PF) 1 % IJ SOLN
INTRAMUSCULAR | Status: AC
  Filled 2014-02-21: qty 30

## 2014-02-21 MED ORDER — INSULIN ASPART 100 UNIT/ML ~~LOC~~ SOLN
0.0000 [IU] | Freq: Three times a day (TID) | SUBCUTANEOUS | Status: DC
  Administered 2014-02-21: 4 [IU] via SUBCUTANEOUS
  Administered 2014-02-22: 7 [IU] via SUBCUTANEOUS
  Administered 2014-02-22: 3 [IU] via SUBCUTANEOUS
  Administered 2014-02-22 – 2014-02-23 (×3): 4 [IU] via SUBCUTANEOUS

## 2014-02-21 MED ORDER — ALBUTEROL SULFATE (2.5 MG/3ML) 0.083% IN NEBU
2.5000 mg | INHALATION_SOLUTION | Freq: Once | RESPIRATORY_TRACT | Status: AC
  Administered 2014-02-21: 2.5 mg via RESPIRATORY_TRACT

## 2014-02-21 MED ORDER — SODIUM CHLORIDE 0.9 % IJ SOLN: 3.0000 mL | Freq: Two times a day (BID) | INTRAMUSCULAR | Status: DC

## 2014-02-21 MED ORDER — SODIUM CHLORIDE 0.9 % IJ SOLN: 3.0000 mL | INTRAMUSCULAR | Status: DC | PRN

## 2014-02-21 MED ORDER — AMLODIPINE BESYLATE 10 MG PO TABS
10.0000 mg | ORAL_TABLET | Freq: Every day | ORAL | Status: DC
  Administered 2014-02-21 – 2014-02-23 (×3): 10 mg via ORAL
  Filled 2014-02-21 (×4): qty 1

## 2014-02-21 MED ORDER — ADULT MULTIVITAMIN W/MINERALS CH
1.0000 | ORAL_TABLET | Freq: Every day | ORAL | Status: DC
  Administered 2014-02-21 – 2014-02-23 (×3): 1 via ORAL
  Filled 2014-02-21 (×4): qty 1

## 2014-02-21 MED ORDER — FENTANYL CITRATE 0.05 MG/ML IJ SOLN
INTRAMUSCULAR | Status: AC
  Filled 2014-02-21: qty 2

## 2014-02-21 MED ORDER — SODIUM CHLORIDE 0.9 % IV SOLN
INTRAVENOUS | Status: DC
  Administered 2014-02-21: 06:00:00 via INTRAVENOUS

## 2014-02-21 MED ORDER — SODIUM CHLORIDE 0.9 % IV SOLN
INTRAVENOUS | Status: AC
  Administered 2014-02-21: 09:00:00 via INTRAVENOUS

## 2014-02-21 MED ORDER — SODIUM CHLORIDE 0.9 % IV SOLN: 250.0000 mL | INTRAVENOUS | Status: DC | PRN

## 2014-02-21 MED ORDER — ASPIRIN 81 MG PO CHEW: 81.0000 mg | CHEWABLE_TABLET | ORAL | Status: DC

## 2014-02-21 MED ORDER — GLIPIZIDE ER 5 MG PO TB24
5.0000 mg | ORAL_TABLET | Freq: Every day | ORAL | Status: DC
  Administered 2014-02-21 – 2014-02-23 (×3): 5 mg via ORAL
  Filled 2014-02-21 (×5): qty 1

## 2014-02-21 MED ORDER — ASPIRIN 81 MG PO CHEW
81.0000 mg | CHEWABLE_TABLET | Freq: Every day | ORAL | Status: DC
  Administered 2014-02-22 – 2014-02-23 (×2): 81 mg via ORAL
  Filled 2014-02-21 (×2): qty 1

## 2014-02-21 MED ORDER — HEPARIN (PORCINE) IN NACL 100-0.45 UNIT/ML-% IJ SOLN
1700.0000 [IU]/h | INTRAMUSCULAR | Status: DC
Start: 2014-02-21 — End: 2014-02-24
  Administered 2014-02-21: 1200 [IU]/h via INTRAVENOUS
  Administered 2014-02-22: 1600 [IU]/h via INTRAVENOUS
  Administered 2014-02-23 – 2014-02-24 (×3): 1700 [IU]/h via INTRAVENOUS
  Filled 2014-02-21 (×6): qty 250

## 2014-02-21 MED ORDER — NITROGLYCERIN 0.4 MG SL SUBL: 0.4000 mg | SUBLINGUAL_TABLET | SUBLINGUAL | Status: DC | PRN

## 2014-02-21 MED ORDER — FUROSEMIDE 40 MG PO TABS
40.0000 mg | ORAL_TABLET | Freq: Every day | ORAL | Status: DC
  Administered 2014-02-21 – 2014-02-23 (×3): 40 mg via ORAL
  Filled 2014-02-21 (×4): qty 1

## 2014-02-21 MED ORDER — HEPARIN SODIUM (PORCINE) 1000 UNIT/ML IJ SOLN
INTRAMUSCULAR | Status: AC
  Filled 2014-02-21: qty 1

## 2014-02-21 MED ORDER — VERAPAMIL HCL 2.5 MG/ML IV SOLN
INTRAVENOUS | Status: AC
  Filled 2014-02-21: qty 2

## 2014-02-21 MED ORDER — MIDAZOLAM HCL 2 MG/2ML IJ SOLN
INTRAMUSCULAR | Status: AC
  Filled 2014-02-21: qty 2

## 2014-02-21 MED ORDER — LISINOPRIL 40 MG PO TABS
40.0000 mg | ORAL_TABLET | Freq: Every day | ORAL | Status: DC
  Administered 2014-02-22 – 2014-02-23 (×2): 40 mg via ORAL
  Filled 2014-02-21 (×3): qty 1

## 2014-02-21 NOTE — Progress Notes (Signed)
ANTICOAGULATION CONSULT NOTE - Initial Consult  Pharmacy Consult for heparin Indication: chest pain/ACS awaiting CABG  No Known Allergies  Patient Measurements: Height: 5\' 10"  (177.8 cm) Weight: 255 lb (115.667 kg) IBW/kg (Calculated) : 73 Heparin Dosing Weight: 98kg  Vital Signs: Temp: 97.6 F (36.4 C) (02/27 0603) Temp src: Oral (02/27 0603) BP: 110/57 mmHg (02/27 1010) Pulse Rate: 61 (02/27 1010)  Labs:  Recent Labs  02/21/14 0602  LABPROT 13.1  INR 1.01    Estimated Creatinine Clearance: 95.9 ml/min (by C-G formula based on Cr of 0.9).   Medical History: Past Medical History  Diagnosis Date  . Impotence of organic origin   . Rash and other nonspecific skin eruption   . Hypertensive renal disease, benign   . Overweight   . Type II or unspecified type diabetes mellitus with renal manifestations, uncontrolled   . Peripheral vascular disease, unspecified   . Essential hypertension, benign   . Hemorrhage of rectum and anus   . Malignant neoplasm of prostate   . Thrombocytopenia, unspecified   . Tobacco use disorder   . Proteinuria   . Personal history of malignant neoplasm of prostate   . Personal history of urinary calculi   . Other abnormal blood chemistry   . Other chest pain   . Slowing of urinary stream   . Encounter for long-term (current) use of other medications   . Rash and other nonspecific skin eruption   . Hemorrhage of gastrointestinal tract, unspecified   . Other and unspecified hyperlipidemia   . Calculus of kidney     Assessment: 52 YOM s/p cath this morning which found left occlusive disease and he is to be evaluated for possible CABG and MAZE procedure. Waws evaluated for new onset AFib recently as an outpatient- was on Xarelto PTA with last dose on 2/23. Baseline PT is 13.1 seconds and INR 1.01- Xarelto appears to have been completely cleared as these values WNL. To start heparin 8 hours after sheath removal. Per Cath report, sheeth  removed at [place].  Goal of Therapy:  Heparin level 0.3-0.7 units/ml Monitor platelets by anticoagulation protocol: Yes   Plan:  1. Stat CBC for baseline value 2. Start heparin gtt at 1200 units/hr NO BOLUS at 1600 this afternoon 3. Heparin level at midnight 4. Daily HL and CBC 5. Follow for surgical plans, s/s bleeding and long term anticoagulation plans  Daaron Dimarco D. Torrin Crihfield, PharmD, BCPS Clinical Pharmacist Pager: [phone] 02/21/2014 11:05 AM

## 2014-02-21 NOTE — Progress Notes (Signed)
UR Completed Fran Mcree Graves-Bigelow, RN,BSN [phone]  

## 2014-02-21 NOTE — Consult Note (Signed)
[address]       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 Peter Martinique   HPI:   The patient is a 73 year old gentleman with a history of Type II DM, obesity, HTN, and prostate cancer who reports a 1 month history of progressive dyspnea with exertion, fatigue, and ankle edema. He says he was fine prior to that. He denies any history of chest pain or pressure. He has been followed by Dr. Mercy Moore of Nephrology for an elevated BUN with normal creatinine and at a recent visit was noted to have an irregular pulse. An ECG showed rate-controlled A-fib. He was referred to cardiology and an echo showed moderate LV systolic dysfunction with an EF of 35%. A stress test was abnormal with apical and inferolateral scar and possible ischemia with an EF of 32%. Cath today shows 100% distal LM occlusion, 100% LAD occlusion with right to left collaterals, 100% LCX occlusion with right to left collaterals and a large dominant RCA with 50% + stenosis distally.  Past Medical History  Diagnosis Date  . Impotence of organic origin   . Rash and other nonspecific skin eruption   . Hypertensive renal disease, benign   . Overweight   . Type II or unspecified type diabetes mellitus with renal manifestations, uncontrolled   . Peripheral vascular disease, unspecified   . Essential hypertension, benign   . Hemorrhage of rectum and anus   . Malignant neoplasm of prostate   . Thrombocytopenia, unspecified   . Tobacco use disorder   . Proteinuria   . Personal history of malignant neoplasm of prostate   . Personal history of urinary calculi   . Other abnormal blood chemistry   . Other chest pain   . Slowing of urinary stream   . Encounter for long-term (current) use of other medications   . Rash and other nonspecific skin eruption   . Hemorrhage  of gastrointestinal tract, unspecified   . Other and unspecified hyperlipidemia   . Calculus of kidney     Past Surgical History  Procedure Laterality Date  . Back surgery    . Cataract extraction  2010    Family History  Problem Relation Age of Onset  . Cancer Mother 83    leukemia  . Heart disease Father   . Heart attack Father 52    Social History:  reports that he has never smoked. His smokeless tobacco use includes Chew. He reports that he does not drink alcohol or use illicit drugs.  Allergies: No Known Allergies  Medications:  I have reviewed the patient's current medications. Prior to Admission:  Prescriptions prior to admission  Medication Sig Dispense Refill  . amLODipine (NORVASC) 10 MG tablet Take 10 mg by mouth daily.      [name] atorvastatin (LIPITOR) 10 MG tablet Take 1 tablet (10 mg total) by mouth daily.  90 tablet  3  . Calcium Carbonate-Vitamin D 600-200 MG-UNIT TABS Take 1 tablet by mouth daily. For bones       . carvedilol (COREG) 25 MG tablet Take 37.5 mg by mouth 2 (two) times daily with a meal. Take 1 and 1/2 tablet twice a day for blood pressure      . CINNAMON PO Take 1 capsule by mouth  daily.      . diltiazem (CARDIZEM CD) 120 MG 24 hr capsule Take 1 capsule (120 mg total) by mouth daily.  90 capsule  2  . Flaxseed, Linseed, (FLAXSEED OIL PO) Take 1 tablet by mouth daily.       . furosemide (LASIX) 40 MG tablet Take 1 tablet (40 mg total) by mouth daily.  90 tablet  3  . GARLIC PO Take 1 tablet by mouth daily.       [name] glipiZIDE (GLUCOTROL XL) 5 MG 24 hr tablet Take 1 tablet once a day for blood sugar.  30 tablet  3  . lisinopril (PRINIVIL,ZESTRIL) 40 MG tablet Take 40 mg by mouth daily. To control blood pressure       . metFORMIN (GLUCOPHAGE) 1000 MG tablet Take 1,000 mg by mouth 2 (two) times daily with a meal.      . Multiple Vitamin (MULTIVITAMINS PO) Take 1 tablet by mouth daily.        . potassium chloride SA (K-DUR,KLOR-CON) 20 MEQ tablet Take 1  tablet (20 mEq total) by mouth daily.  90 tablet  3  . Rivaroxaban (XARELTO) 20 MG TABS tablet Take 1 tablet (20 mg total) by mouth daily with supper.  90 tablet  2  . nitroGLYCERIN (NITROSTAT) 0.4 MG SL tablet Place 1 tablet (0.4 mg total) under the tongue every 5 (five) minutes as needed for chest pain.  25 tablet  11   Scheduled: . amLODipine  10 mg Oral Daily  . aspirin  81 mg Oral Daily  . [START ON 02/22/2014] atorvastatin  10 mg Oral Daily  . carvedilol  37.5 mg Oral BID WC  . furosemide  40 mg Oral Daily  . glipiZIDE  5 mg Oral Q breakfast  . insulin aspart  0-20 Units Subcutaneous TID WC  . [START ON 02/22/2014] lisinopril  40 mg Oral Daily  . multivitamin with minerals  1 tablet Oral Daily  . potassium chloride SA  20 mEq Oral Daily   Continuous: . heparin 1,200 Units/hr (02/21/14 1618)   [id number]  Results for orders placed during the hospital encounter of 02/21/14 (from the past 48 hour(s))  PROTIME-INR     Status: None   Collection Time    02/21/14  6:02 AM      Result Value Ref Range   Prothrombin Time 13.1  11.6 - 15.2 seconds   INR 1.01  0.00 - 1.49  GLUCOSE, CAPILLARY     Status: Abnormal   Collection Time    02/21/14  8:55 AM      Result Value Ref Range   Glucose-Capillary 199 (*) 70 - 99 mg/dL  GLUCOSE, CAPILLARY     Status: Abnormal   Collection Time    02/21/14 12:02 PM      Result Value Ref Range   Glucose-Capillary 190 (*) 70 - 99 mg/dL   Comment 1 Documented in Chart     Comment 2 Notify RN    CBC     Status: Abnormal   Collection Time    02/21/14 12:10 PM      Result Value Ref Range   WBC 8.6  4.0 - 10.5 K/uL   RBC 4.43  4.22 - 5.81 MIL/uL   Hemoglobin 12.4 (*) 13.0 - 17.0 g/dL   HCT 38.0 (*) 39.0 - 52.0 %   MCV 85.8  78.0 - 100.0 fL   MCH 28.0  26.0 - 34.0 pg   MCHC 32.6  30.0 - 36.0  g/dL   RDW 13.9  11.5 - 15.5 %   Platelets 136 (*) 150 - 400 K/uL    No results found.  Review of Systems  Constitutional: Positive for  malaise/fatigue. Negative for fever, chills and weight loss.  HENT: Negative.   Eyes: Negative.   Respiratory: Positive for shortness of breath. Negative for cough and sputum production.   Cardiovascular: Positive for leg swelling. Negative for chest pain, palpitations, orthopnea, claudication and PND.  Gastrointestinal: Negative.   Genitourinary: Negative.   Musculoskeletal: Negative.   Skin: Negative.   Neurological: Negative.   Endo/Heme/Allergies: Negative.   Psychiatric/Behavioral: Negative.    Blood pressure 110/57, pulse 61, temperature 97.6 F (36.4 C), temperature source Oral, resp. rate 18, height 5\' 10"  (1.778 m), weight 115.667 kg (255 lb), SpO2 97.00%. Physical Exam  Constitutional: He is oriented to person, place, and time.  Obese white male in no distress   HENT:  Head: Normocephalic and atraumatic.  Mouth/Throat: Oropharynx is clear and moist.  Eyes: EOM are normal. Pupils are equal, round, and reactive to light.  Neck: Normal range of motion. Neck supple. No JVD present. No thyromegaly present.  Cardiovascular: Normal rate, regular rhythm, normal heart sounds and intact distal pulses.  Exam reveals no gallop and no friction rub.   No murmur heard. Respiratory: Effort normal and breath sounds normal. No respiratory distress. He has no rales.  GI: Soft. Bowel sounds are normal. He exhibits no distension and no mass. There is no tenderness.  Musculoskeletal: Normal range of motion. He exhibits edema. He exhibits no tenderness.  Lymphadenopathy:    He has no cervical adenopathy.  Neurological: He is alert and oriented to person, place, and time. He has normal strength. No cranial nerve deficit or sensory deficit.  Skin: Skin is warm and dry.  Psychiatric: He has a normal mood and affect.     *[facility], [place], [place]  [id number] [phone]  ------------------------------------------------------------ Transthoracic Echocardiography  Patient: Alexie, Kohlbeck MR #: [id number] Study Date: 02/17/2014 Gender: M Age: 47 Height: 177.8cm Weight: 116.6kg BSA: 2.43m^2 Pt. Status: Room:  ATTENDING Marthann Schiller, Evelena Leyden SONOGRAPHER Jimmy Reel PERFORMING [facility], Outpatient cc:  ------------------------------------------------------------ LV EF: 35% - 40%  ------------------------------------------------------------ Indications: Atrial fibrillation - s/p cardioversion 427.31.  ------------------------------------------------------------ History: PMH: Chest pain. Risk factors: Current tobacco use. Hypertension. Diabetes mellitus. Dyslipidemia.  Obesity  ------------------------------------------------------------ Study Conclusions  - Left ventricle: The cavity size was normal. Wall thickness was normal. Systolic function was moderately reduced. The estimated ejection fraction was in the range of 35% to 40%. There is akinesis of the mid-distalanteroseptal and apical myocardium. Features are consistent with a pseudonormal left ventricular filling pattern, with concomitant abnormal relaxation and increased filling pressure (grade 2 diastolic dysfunction). Doppler parameters are consistent with high ventricular filling pressure. - Mitral valve: Calcified annulus. - Left atrium: The atrium was moderately dilated. Transthoracic echocardiography. M-mode, complete 2D, spectral Doppler, and color Doppler. Height: Height: 177.8cm. Height: 70in. Weight: Weight: 116.6kg. Weight: 256.5lb. Body mass index: BMI: 36.9kg/m^2. Body surface area: BSA: 2.9m^2. Blood pressure: 132/62. Patient status: Outpatient. Location: Echo  laboratory.  ------------------------------------------------------------  ------------------------------------------------------------ Left ventricle: The cavity size was normal. Wall thickness was normal. Systolic function was moderately reduced. The estimated ejection fraction was in the range of 35% to 40%. Regional wall motion abnormalities: There is akinesis of the mid-distalanteroseptal and apical myocardium. Features are consistent with a pseudonormal left ventricular filling pattern, with concomitant abnormal  relaxation and increased filling pressure (grade 2 diastolic dysfunction). Doppler parameters are consistent with high ventricular filling pressure.  ------------------------------------------------------------ Aortic valve: Trileaflet; mildly thickened leaflets. Mobility was not restricted. Doppler: Transvalvular velocity was within the normal range. There was no stenosis. No regurgitation. VTI ratio of LVOT to aortic valve: 0.91. Indexed valve area: 1.36cm^2/m^2 (VTI). Peak velocity ratio of LVOT to aortic valve: 1.06. Indexed valve area: 1.58cm^2/m^2 (Vmax). Mean gradient: 89mm Hg (S).  ------------------------------------------------------------ Aorta: Aortic root: The aortic root was normal in size.  ------------------------------------------------------------ Mitral valve: Calcified annulus. Mobility was not restricted. Doppler: Transvalvular velocity was within the normal range. There was no evidence for stenosis. No regurgitation. Indexed valve area by continuity equation (using LVOT flow): 1cm^2/m^2. Mean gradient: 49mm Hg (D). Peak gradient: 59mm Hg (D).  ------------------------------------------------------------ Left atrium: LA volume / BSA = 62.19ml/m2 The atrium was moderately dilated.  ------------------------------------------------------------ Right ventricle: The cavity size was normal. Systolic function was  normal.  ------------------------------------------------------------ Pulmonic valve: Doppler: Transvalvular velocity was within the normal range. There was no evidence for stenosis. Trivial regurgitation.  ------------------------------------------------------------ Tricuspid valve: Structurally normal valve. Doppler: Transvalvular velocity was within the normal range. Trivial regurgitation.  ------------------------------------------------------------ Pulmonary artery: Systolic pressure was within the normal range.  ------------------------------------------------------------ Right atrium: The atrium was normal in size.  ------------------------------------------------------------ Pericardium: There was no pericardial effusion.  ------------------------------------------------------------ Systemic veins: Inferior vena cava: The vessel was dilated. Diameter: 25.27mm.  ------------------------------------------------------------  2D measurements Normal Doppler measurements Normal IVC Main pulmonary Diam 25.7 mm ------ artery Left ventricle Pressure, 15 mm Hg =30 LVID ED, 49.4 mm 43-52 S chord, Pressure 15 mm Hg ------ PLAX ED LVID ES, 42.9 mm 23-38 Left ventricle chord, Ea, lat 10 cm/s ------ PLAX ann, tiss FS, chord, 13 % >29 DP PLAX E/Ea, lat 11.4 ------ LVPW, ED 10.2 mm ------ ann, tiss IVS/LVPW 1.05 <1.3 DP ratio, ED Ea, med 7.3 cm/s ------ Ventricular septum ann, tiss IVS, ED 10.7 mm ------ DP LVOT E/Ea, med 15.6 ------ Diam, S 21 mm ------ ann, tiss 2 Area 3.46 cm^2 ------ DP Aorta LVOT Root diam, 30 mm ------ Peak vel, 148 cm/s ------ ED S Left atrium VTI, S 26.3 cm ------ AP dim 42 mm ------ Peak 9 mm Hg ------ AP dim 1.81 cm/m^2 <2.2 gradient, index S Aortic valve Peak vel, 140 cm/s ------ S Mean vel, 103 cm/s ------ S VTI, S 28.9 cm ------ Mean 5 mm Hg ------ gradient, S VTI ratio 0.91 ------ LVOT/AV Area index 1.36 cm^2/m ------ (VTI)  ^2 Peak vel 1.06 ------ ratio, LVOT/AV Area index 1.58 cm^2/m ------ (Vmax) ^2 Mitral valve Peak E vel 114 cm/s ------ Peak A vel 86.9 cm/s ------ Mean vel, 68.5 cm/s ------ D Decelerati 208 ms 150-23 on time 0 Mean 2 mm Hg ------ gradient, D Peak 6 mm Hg ------ gradient, D Peak E/A 1.3 ------ ratio Area index 1 cm^2/m ------ (LVOT ^2 cont) Annulus 39.1 cm ------ VTI Tricuspid valve Regurg 130 cm/s ------ peak vel Peak RV-RA 7 mm Hg ------ gradient, S Systemic veins Estimated 8 mm Hg ------ CVP Right ventricle Pressure, 15 mm Hg <30 S Sa vel, 16.1 cm/s ------ lat ann, tiss DP Pulmonic valve Regurg 135 cm/s ------ vel, ED  ------------------------------------------------------------ Prepared and Electronically Authenticated by  Kirk Ruths 2015-02-23T13:29:[id number]   Cardiac Catheterization Procedure Note  Name: MANASES CHESKY  MRN: [id number]  DOB: May 13, 1941  Procedure: Left Heart Cath, Selective Coronary Angiography  Indication: 73 yo WM with history of DM, HTN, HL presents  with new onset atrial fibrillation. He complains of worsening dyspnea. Myoview study demonstrates inferolateral and apical infarct with peri-infarct ischemia. EF 32%. By Echo EF is 35-40%.  Procedural Details: The right wrist was prepped, draped, and anesthetized with 1% lidocaine. Using the modified Seldinger technique, a 5 French sheath was introduced into the right radial artery. 3 mg of verapamil was administered through the sheath, weight-based unfractionated heparin was administered intravenously. Standard Judkins catheters were used for selective coronary angiography and left pressures. Catheter exchanges were performed over an exchange length guidewire. There were no immediate procedural complications. A TR band was used for radial hemostasis at the completion of the procedure. The patient was transferred to the post catheterization recovery area for further monitoring.   Procedural Findings:  Hemodynamics:  AO 105/51 mean 74 mm Hg  LV 105/27 mm Hg  Coronary angiography:  Coronary dominance: right  Left mainstem: The left main is 100% occluded distally.  Left anterior descending (LAD): 100%  Left circumflex (LCx): 100%  Right coronary artery (RCA): The RCA is a large dominant vessel. It is moderately calcified. There is a 50% stenosis distally. The RCA gives collaterals to the entire LAD, diagonal, and distal LCx.  Left ventriculography: N/A  Final Conclusions:  1. Left main occlusive disease. The RCA supplies collaterals to the LAD, diagonal, and LCx.  Recommendations: Will admit to telemetry. Anticoagulate with IV heparin. Surgical consult for CABG and possible MAZE procedure.  Collier [facility]  02/21/2014, 8:12 AM    Assessment/Plan:  He has severe left main and 3-vessel coronary disease with moderate LV dysfunction and class III congestive heart failure symptoms that all started about a month ago. I think the best treatment is CABG to try to improve his LV function and prevent further ischemia and infarction. I think the RCA stenosis is very significant, a little hazy and probably greater than 50%. He has new onset A-fib but did not know he was in it and has been rate controlled in it. He has been alternating between sinus and A-fib since this came to light recently. I would not recommend doing a MAZE in this morbidly obese 73 year old with his degree of coronary disease and LV dysfunction. The risk of opening both sides of his heart and prolonging the surgery significantly due to his morbid obesity and poor exposure exceeds the potential benefit. I would plan to clip the left atrial appendage to decrease his stroke risk. I discussed the operative procedure with the patient and family including alternatives, benefits and risks; including but not limited to bleeding, blood transfusion, infection, stroke, myocardial infarction, graft failure, heart block  requiring a permanent pacemaker, organ dysfunction, and death.  Jeannie Done understands and agrees to proceed.  We will schedule surgery for Monday.  Gaye Pollack 02/21/2014, 4:36 PM

## 2014-02-21 NOTE — CV Procedure (Signed)
    Cardiac Catheterization Procedure Note  Name: Seth [name] MRN: [id number] DOB: [birth year]  Procedure: Left Heart Cath, Selective Coronary Angiography  Indication: 73 yo WM with history of DM, HTN, HL presents with new onset atrial fibrillation. He complains of worsening dyspnea. Myoview study demonstrates inferolateral and apical infarct with peri-infarct ischemia. EF 32%. By Echo EF is 35-40%.   Procedural Details: The right wrist was prepped, draped, and anesthetized with 1% lidocaine. Using the modified Seldinger technique, a 5 French sheath was introduced into the right radial artery. 3 mg of verapamil was administered through the sheath, weight-based unfractionated heparin was administered intravenously. Standard Judkins catheters were used for selective coronary angiography and left pressures. Catheter exchanges were performed over an exchange length guidewire. There were no immediate procedural complications. A TR band was used for radial hemostasis at the completion of the procedure.  The patient was transferred to the post catheterization recovery area for further monitoring.  Procedural Findings: Hemodynamics: AO 105/51 mean 74 mm Hg LV 105/27 mm Hg  Coronary angiography: Coronary dominance: right  Left mainstem: The left main is 100% occluded distally.  Left anterior descending (LAD): 100%  Left circumflex (LCx): 100%  Right coronary artery (RCA): The RCA is a large dominant vessel. It is moderately calcified. There is a 50% stenosis distally. The RCA gives collaterals to the entire LAD, diagonal, and distal LCx.  Left ventriculography: N/A  Final Conclusions:   1. Left main occlusive disease. The RCA supplies collaterals to the LAD, diagonal, and LCx.  Recommendations: Will admit to telemetry. Anticoagulate with IV heparin. Surgical consult for CABG and possible MAZE procedure.  Collier Salina [facility] 02/21/2014, 8:12 AM

## 2014-02-21 NOTE — Care Management Note (Unsigned)
    Page 1 of 1   02/28/2014     3:08:45 PM   CARE MANAGEMENT NOTE 02/28/2014  Patient:  Seth [name], Seth [name]   Account Number:  [id number]  Date Initiated:  02/21/2014  Documentation initiated by:  GRAVES-BIGELOW,BRENDA  Subjective/Objective Assessment:   Pt admitted for cp and abnormal stress test. S/p cath revealed Left main occlusive disease. Anticoagulate with Iv heparin gtt and Surgical consult for CABG and possible MAZE procedure.     Action/Plan:   CM will continue to monitor for disposition needs.   Anticipated DC Date:  03/01/2014   Anticipated DC Plan:  [place]  CM consult      Choice offered to / List presented to:             Status of service:  In process, will continue to follow Medicare Important Message given?   (If response is "NO", the following Medicare IM given date fields will be blank) Date Medicare IM given:   Date Additional Medicare IM given:    Discharge Disposition:    Per UR Regulation:  Reviewed for med. necessity/level of care/duration of stay  If discussed at [place] of Stay Meetings, dates discussed:   02/27/2014    Comments:  02/28/14 [name],RN,BSN [id number] PT [place]; [place] WEEKEND. WIFE TO PROVIDE CARE AT DC.  02/25/14 [place] RN MSN BSN CCM S/P CABG x3, extubated, neo weaned, insulin gtt.

## 2014-02-21 NOTE — Interval H&P Note (Signed)
History and Physical Interval Note:  02/21/2014 7:39 AM  Seth [name]  has presented today for surgery, with the diagnosis of abnormal stress test/ chest pain  The various methods of treatment have been discussed with the patient and family. After consideration of risks, benefits and other options for treatment, the patient has consented to  Procedure(s): LEFT HEART CATHETERIZATION WITH CORONARY ANGIOGRAM (N/A) as a surgical intervention .  The patient's history has been reviewed, patient examined, no change in status, stable for surgery.  I have reviewed the patient's chart and labs.  Questions were answered to the patient's satisfaction.   Cath Lab Visit (complete for each Cath Lab visit)  Clinical Evaluation Leading to the Procedure:   ACS: no  Non-ACS:    Anginal Classification: CCS III  Anti-ischemic medical therapy: Maximal Therapy (2 or more classes of medications)  Non-Invasive Test Results: High-risk stress test findings: cardiac mortality >3%/year  Prior CABG: No previous CABG        Seth [name] [facility] 02/21/2014 7:39 AM

## 2014-02-22 DIAGNOSIS — Z01810 Encounter for preprocedural cardiovascular examination: Secondary | ICD-10-CM

## 2014-02-22 DIAGNOSIS — I10 Essential (primary) hypertension: Secondary | ICD-10-CM

## 2014-02-22 DIAGNOSIS — E785 Hyperlipidemia, unspecified: Secondary | ICD-10-CM

## 2014-02-22 DIAGNOSIS — I2510 Atherosclerotic heart disease of native coronary artery without angina pectoris: Principal | ICD-10-CM

## 2014-02-22 DIAGNOSIS — I200 Unstable angina: Secondary | ICD-10-CM

## 2014-02-22 LAB — CBC
HCT: 39.8 % (ref 39.0–52.0)
Hemoglobin: 13.1 g/dL (ref 13.0–17.0)
MCH: 28.2 pg (ref 26.0–34.0)
MCHC: 32.9 g/dL (ref 30.0–36.0)
MCV: 85.8 fL (ref 78.0–100.0)
Platelets: 141 10*3/uL — ABNORMAL LOW (ref 150–400)
RBC: 4.64 MIL/uL (ref 4.22–5.81)
RDW: 14 % (ref 11.5–15.5)
WBC: 8.2 10*3/uL (ref 4.0–10.5)

## 2014-02-22 LAB — GLUCOSE, CAPILLARY
Glucose-Capillary: 183 mg/dL — ABNORMAL HIGH (ref 70–99)
Glucose-Capillary: 217 mg/dL — ABNORMAL HIGH (ref 70–99)
Glucose-Capillary: 145 mg/dL — ABNORMAL HIGH (ref 70–99)
Glucose-Capillary: 128 mg/dL — ABNORMAL HIGH (ref 70–99)

## 2014-02-22 LAB — HEPARIN LEVEL (UNFRACTIONATED)
Heparin Unfractionated: 0.1 IU/mL — ABNORMAL LOW (ref 0.30–0.70)
Heparin Unfractionated: 0.3 IU/mL (ref 0.30–0.70)
Heparin Unfractionated: 0.39 IU/mL (ref 0.30–0.70)

## 2014-02-22 LAB — MRSA PCR SCREENING: MRSA by PCR: NEGATIVE

## 2014-02-22 NOTE — Progress Notes (Addendum)
VASCULAR LAB PRELIMINARY  PRELIMINARY  PRELIMINARY  PRELIMINARY  Pre-op Cardiac Surgery  Carotid Findings:   Preliminary report:  1-39% ICA stenosis.  Vertebral artery flow is antegrade.  KANADY, CANDACE, RVT 02/22/2014, 10:29 AM    Upper Extremity Right Left  Brachial Pressures 127-Triphasic 131-Triphasic  Radial Waveforms Triphasic Triphasic  Ulnar Waveforms Triphasic Triphasic  Palmar Arch (Allen's Test) Within normal limits Within normal limits   Lower  Extremity Right Left  Dorsalis Pedis 132-Monophasic 146-Triphasic  Anterior Tibial    Posterior Tibial 140-Triphasic 90-Triphasic  Ankle/Brachial Indices 1.07 1.11    Findings:   Bilateral ABIs are within normal limits.   02/23/2014 12:00 PM Maudry Mayhew, RVT, RDCS, RDMS

## 2014-02-22 NOTE — Progress Notes (Signed)
ANTICOAGULATION CONSULT NOTE - Follow Up Consult  Pharmacy Consult for heparin Indication: CAD awaiting CABG Patient Measurements:  Height: 5\' 10"  (177.8 cm)  Weight: 255 lb (115.667 kg)  IBW/kg (Calculated) : 73  Heparin Dosing Weight: 98kg Labs:  Recent Labs  02/21/14 0602 02/21/14 1210 02/22/14 0018 02/22/14 0850  HGB  --  12.4*  --  13.1  HCT  --  38.0*  --  39.8  PLT  --  136*  --  141*  LABPROT 13.1  --   --   --   INR 1.01  --   --   --   HEPARINUNFRC  --   --  0.10* 0.30    Assessment: 73yo male on IV heparin with severe CAD awaiting CABG. Repeat heparin level this am is therapeutic but at the low end at 0.3 on 1600 units/hr. H/H is improved. Platelets are rising at 141.   Goal of Therapy:  Heparin level 0.3-0.7 units/ml   Plan:  Increase heparin to 1700 units/hr to keep within goal. Recheck heparin level in 8 hours to confirm.   Sloan Leiter, PharmD, BCPS Clinical Pharmacist [phone]  02/22/2014,9:40 AM

## 2014-02-22 NOTE — Progress Notes (Signed)
ANTICOAGULATION CONSULT NOTE - Follow Up Consult  Pharmacy Consult for heparin Indication: CAD awaiting CABG  No Known Allergies  Patient Measurements: Height: 5\' 10"  (177.8 cm) Weight: 255 lb (115.667 kg) IBW/kg (Calculated) : 73 Heparin Dosing Weight:   Vital Signs: Temp: 98.5 F (36.9 C) (02/28 1358) Temp src: Oral (02/28 1358) BP: 110/67 mmHg (02/28 1358) Pulse Rate: 60 (02/28 1358)  Labs:  Recent Labs  02/21/14 0602 02/21/14 1210 02/22/14 0018 02/22/14 0850 02/22/14 1820  HGB  --  12.4*  --  13.1  --   HCT  --  38.0*  --  39.8  --   PLT  --  136*  --  141*  --   LABPROT 13.1  --   --   --   --   INR 1.01  --   --   --   --   HEPARINUNFRC  --   --  0.10* 0.30 0.39    Estimated Creatinine Clearance: 95.9 ml/min (by C-G formula based on Cr of 0.9).   Medications:  Scheduled:  . amLODipine  10 mg Oral Daily  . aspirin  81 mg Oral Daily  . atorvastatin  10 mg Oral Daily  . carvedilol  37.5 mg Oral BID WC  . furosemide  40 mg Oral Daily  . glipiZIDE  5 mg Oral Q breakfast  . insulin aspart  0-20 Units Subcutaneous TID WC  . lisinopril  40 mg Oral Daily  . multivitamin with minerals  1 tablet Oral Daily  . potassium chloride SA  20 mEq Oral Daily   Infusions:  . heparin 1,700 Units/hr (02/22/14 1142)    Assessment: 73 yo male with CAD awaiting CABG is currently on therapeutic heparin.  Heparin level was 0.39. Goal of Therapy:  Heparin level 0.3-0.7 units/ml Monitor platelets by anticoagulation protocol: Yes   Plan:  1) Continue heparin at 1700 units/hr 2) Heparin level in am  Roi Jafari, Tsz-Yin 02/22/2014,7:17 PM

## 2014-02-22 NOTE — Progress Notes (Signed)
    Primary cardiologist: Dr. Lauree Chandler   Subjective:    No chest pain or shortness of breath at rest.  Objective:   Temp:  [97 F (36.1 C)-98.7 F (37.1 C)] 97 F (36.1 C) (02/28 0939) Pulse Rate:  [61-68] 61 (02/28 0939) Resp:  [17-18] 17 (02/28 0939) BP: (119-130)/(54-69) 120/54 mmHg (02/28 0955) SpO2:  [96 %-97 %] 97 % (02/28 0939) Last BM Date: 02/22/14  Filed Weights   02/21/14 0603  Weight: 255 lb (115.667 kg)    Intake/Output Summary (Last 24 hours) at 02/22/14 1144 Last data filed at 02/22/14 0845  Gross per 24 hour  Intake  838.2 ml  Output      0 ml  Net  838.2 ml    Telemetry: Sinus rhythm, intermittent sinus bradycardia noted as well.  Exam:  General: No distress.  Lungs: Clear, nonlabored.  Cardiac: RRR, no gallop.  Extremities: No pitting.   Lab Results:  CBC:  Recent Labs Lab 02/21/14 1210 02/22/14 0850  WBC 8.6 8.2  HGB 12.4* 13.1  HCT 38.0* 39.8  MCV 85.8 85.8  PLT 136* 141*    Echocardiogram (2/23): Study Conclusions  - Left ventricle: The cavity size was normal. Wall thickness was normal. Systolic function was moderately reduced. The estimated ejection fraction was in the range of 35% to 40%. There is akinesis of the mid-distalanteroseptal and apical myocardium. Features are consistent with a pseudonormal left ventricular filling pattern, with concomitant abnormal relaxation and increased filling pressure (grade 2 diastolic dysfunction). Doppler parameters are consistent with high ventricular filling pressure. - Mitral valve: Calcified annulus. - Left atrium: The atrium was moderately dilated.    Medications:   Scheduled Medications: . amLODipine  10 mg Oral Daily  . aspirin  81 mg Oral Daily  . atorvastatin  10 mg Oral Daily  . carvedilol  37.5 mg Oral BID WC  . furosemide  40 mg Oral Daily  . glipiZIDE  5 mg Oral Q breakfast  . insulin aspart  0-20 Units Subcutaneous TID WC  . lisinopril  40 mg  Oral Daily  . multivitamin with minerals  1 tablet Oral Daily  . potassium chloride SA  20 mEq Oral Daily     Infusions: . heparin 1,700 Units/hr (02/22/14 1142)     PRN Medications:  nitroGLYCERIN   Assessment:   1. Severe multivessel/LM CAD documented at cardiac catheterization 2/27.  2. Ischemic cardiomyopathy, LVEF 35-40% by recent echocardiogram.  3. Recently documented atrial fibrillation, rate controlled. Not on oral anticoagulant at this time given recent and anticipated invasive testing/procedures. He is on heparin.  4. Hyperlipidemia, on statin therapy.  5. Hypertension.  6. Type 2 diabetes mellitus.   Plan/Discussion:    Patient has been seen by Dr. Cyndia Bent with plans for CABG and left atrial clipping on Monday. Continues on aspirin, Norvasc, Lipitor, Coreg, Lasix, lisinopril, potassium supplements, and heparin. Followup BMET and CBC in a.m.   Satira Sark, M.D., F.A.C.C.

## 2014-02-22 NOTE — Progress Notes (Signed)
CARDIAC REHAB PHASE I  3:10pm-3:40pm Completed pre-op teaching with patient.  Patient is interested in cardiac rehab after surgery.  Patient has already watched video.   Vonita Moss Graingers, [place] 02/22/2014 3:37 PM

## 2014-02-22 NOTE — Progress Notes (Signed)
Pt for [facility] per   MD notes. DBE/coughing exercises, Wound splinting, proper use of pain medication post CABG, ambulation, proper use of incentive spirometer completed with adequate return demo completed. Will continue to follow-up in AM . Had pt and family watch OHS video 112 and 113, [facility] booklet given . Had also instructed pt to be extra careful with handling/watching  his wound site by family/himself and other health care personnel so that he won't unnecessarily expose himself to possible infection. Pt and family understands.

## 2014-02-22 NOTE — Progress Notes (Signed)
ANTICOAGULATION CONSULT NOTE - Follow Up Consult  Pharmacy Consult for heparin Indication: CAD awaiting CABG  Labs:  Recent Labs  02/21/14 0602 02/21/14 1210 02/22/14 0018  HGB  --  12.4*  --   HCT  --  38.0*  --   PLT  --  136*  --   LABPROT 13.1  --   --   INR 1.01  --   --   HEPARINUNFRC  --   --  0.10*    Assessment: 73yo male subtherapeutic on heparin with initial dosing post-cath now awaiting CABG.  Goal of Therapy:  Heparin level 0.3-0.7 units/ml   Plan:  Will increase heparin gtt by 4 units/kg/hr to 1600 units/hr and check level in 6hr.  Wynona Neat, PharmD, BCPS  02/22/2014,1:20 AM

## 2014-02-23 ENCOUNTER — Inpatient Hospital Stay (HOSPITAL_COMMUNITY): Payer: Medicare Other

## 2014-02-23 LAB — COMPREHENSIVE METABOLIC PANEL
ALT: 16 U/L (ref 0–53)
AST: 12 U/L (ref 0–37)
Albumin: 3.5 g/dL (ref 3.5–5.2)
Alkaline Phosphatase: 47 U/L (ref 39–117)
BUN: 21 mg/dL (ref 6–23)
CO2: 30 mEq/L (ref 19–32)
Calcium: 9.3 mg/dL (ref 8.4–10.5)
Chloride: 99 mEq/L (ref 96–112)
Creatinine, Ser: 0.93 mg/dL (ref 0.50–1.35)
GFR calc Af Amer: >90 mL/min (ref >90)
GFR calc non Af Amer: 83 mL/min — ABNORMAL LOW (ref >90)
Glucose, Bld: 118 mg/dL — ABNORMAL HIGH (ref 70–99)
Potassium: 3.8 mEq/L (ref 3.7–5.3)
Sodium: 138 mEq/L (ref 137–147)
Total Bilirubin: 0.4 mg/dL (ref 0.3–1.2)
Total Protein: 7.1 g/dL (ref 6.0–8.3)

## 2014-02-23 LAB — URINALYSIS, ROUTINE W REFLEX MICROSCOPIC
Bilirubin Urine: NEGATIVE
Glucose, UA: NEGATIVE mg/dL
Hgb urine dipstick: NEGATIVE
Ketones, ur: NEGATIVE mg/dL
Leukocytes, UA: NEGATIVE
Nitrite: NEGATIVE
Protein, ur: NEGATIVE mg/dL
Specific Gravity, Urine: 1.014 (ref 1.005–1.030)
Urobilinogen, UA: 1 mg/dL (ref 0.0–1.0)
pH: 6 (ref 5.0–8.0)

## 2014-02-23 LAB — CBC
HCT: 37 % — ABNORMAL LOW (ref 39.0–52.0)
Hemoglobin: 12.1 g/dL — ABNORMAL LOW (ref 13.0–17.0)
MCH: 28.1 pg (ref 26.0–34.0)
MCHC: 32.7 g/dL (ref 30.0–36.0)
MCV: 86 fL (ref 78.0–100.0)
Platelets: 134 10*3/uL — ABNORMAL LOW (ref 150–400)
RBC: 4.3 MIL/uL (ref 4.22–5.81)
RDW: 14 % (ref 11.5–15.5)
WBC: 7.9 10*3/uL (ref 4.0–10.5)

## 2014-02-23 LAB — BASIC METABOLIC PANEL
BUN: 24 mg/dL — ABNORMAL HIGH (ref 6–23)
CO2: 27 mEq/L (ref 19–32)
Calcium: 9.1 mg/dL (ref 8.4–10.5)
Chloride: 102 mEq/L (ref 96–112)
Creatinine, Ser: 0.91 mg/dL (ref 0.50–1.35)
GFR calc Af Amer: >90 mL/min (ref >90)
GFR calc non Af Amer: 83 mL/min — ABNORMAL LOW (ref >90)
Glucose, Bld: 194 mg/dL — ABNORMAL HIGH (ref 70–99)
Potassium: 3.9 mEq/L (ref 3.7–5.3)
Sodium: 143 mEq/L (ref 137–147)

## 2014-02-23 LAB — PROTIME-INR
INR: 1.02 (ref 0.00–1.49)
Prothrombin Time: 13.2 seconds (ref 11.6–15.2)

## 2014-02-23 LAB — BLOOD GAS, ARTERIAL
Acid-Base Excess: 3.4 mmol/L — ABNORMAL HIGH (ref 0.0–2.0)
Bicarbonate: 27.5 mEq/L — ABNORMAL HIGH (ref 20.0–24.0)
Drawn by: 313941
FIO2: 0.21 %
O2 Saturation: 93.6 %
Patient temperature: 98.6
TCO2: 28.8 mmol/L (ref 0–100)
pCO2 arterial: 42.5 mmHg (ref 35.0–45.0)
pH, Arterial: 7.426 (ref 7.350–7.450)
pO2, Arterial: 68.4 mmHg — ABNORMAL LOW (ref 80.0–100.0)

## 2014-02-23 LAB — TYPE AND SCREEN
ABO/RH(D): A NEG
Antibody Screen: NEGATIVE

## 2014-02-23 LAB — GLUCOSE, CAPILLARY
Glucose-Capillary: 174 mg/dL — ABNORMAL HIGH (ref 70–99)
Glucose-Capillary: 170 mg/dL — ABNORMAL HIGH (ref 70–99)
Glucose-Capillary: 117 mg/dL — ABNORMAL HIGH (ref 70–99)
Glucose-Capillary: 153 mg/dL — ABNORMAL HIGH (ref 70–99)

## 2014-02-23 LAB — ABO/RH: ABO/RH(D): A NEG

## 2014-02-23 LAB — HEPARIN LEVEL (UNFRACTIONATED): Heparin Unfractionated: 0.46 IU/mL (ref 0.30–0.70)

## 2014-02-23 LAB — APTT: aPTT: 61 seconds — ABNORMAL HIGH (ref 24–37)

## 2014-02-23 MED ORDER — ALPRAZOLAM 0.25 MG PO TABS: 0.2500 mg | ORAL_TABLET | ORAL | Status: DC | PRN

## 2014-02-23 MED ORDER — DEXTROSE 5 % IV SOLN
30.0000 ug/min | INTRAVENOUS | Status: AC
  Administered 2014-02-24: 25 ug/min via INTRAVENOUS
  Filled 2014-02-23 (×2): qty 2

## 2014-02-23 MED ORDER — DEXTROSE 5 % IV SOLN
1.5000 g | INTRAVENOUS | Status: AC
  Administered 2014-02-24: .75 g via INTRAVENOUS
  Administered 2014-02-24: 1.5 g via INTRAVENOUS
  Filled 2014-02-23 (×2): qty 1.5

## 2014-02-23 MED ORDER — BISACODYL 5 MG PO TBEC
5.0000 mg | DELAYED_RELEASE_TABLET | Freq: Once | ORAL | Status: DC
  Filled 2014-02-23: qty 1

## 2014-02-23 MED ORDER — NITROGLYCERIN IN D5W 200-5 MCG/ML-% IV SOLN
2.0000 ug/min | INTRAVENOUS | Status: AC
  Administered 2014-02-24: 5 ug/min via INTRAVENOUS
  Filled 2014-02-23 (×2): qty 250

## 2014-02-23 MED ORDER — SODIUM CHLORIDE 0.9 % IV SOLN
INTRAVENOUS | Status: AC
  Administered 2014-02-24: 69.8 mL/h via INTRAVENOUS
  Administered 2014-02-24: 12:00:00 via INTRAVENOUS
  Filled 2014-02-23 (×2): qty 40

## 2014-02-23 MED ORDER — DOPAMINE-DEXTROSE 3.2-5 MG/ML-% IV SOLN
2.0000 ug/kg/min | INTRAVENOUS | Status: DC
  Filled 2014-02-23 (×2): qty 250

## 2014-02-23 MED ORDER — DIAZEPAM 5 MG PO TABS
5.0000 mg | ORAL_TABLET | Freq: Once | ORAL | Status: AC
  Administered 2014-02-24: 5 mg via ORAL
  Filled 2014-02-23: qty 1

## 2014-02-23 MED ORDER — VANCOMYCIN HCL 10 G IV SOLR
1500.0000 mg | INTRAVENOUS | Status: AC
  Administered 2014-02-24: 1500 mg via INTRAVENOUS
  Filled 2014-02-23 (×2): qty 1500

## 2014-02-23 MED ORDER — POTASSIUM CHLORIDE 2 MEQ/ML IV SOLN
80.0000 meq | INTRAVENOUS | Status: DC
  Filled 2014-02-23 (×2): qty 40

## 2014-02-23 MED ORDER — MAGNESIUM SULFATE 50 % IJ SOLN
40.0000 meq | INTRAMUSCULAR | Status: DC
  Filled 2014-02-23 (×2): qty 10

## 2014-02-23 MED ORDER — TEMAZEPAM 15 MG PO CAPS: 15.0000 mg | ORAL_CAPSULE | Freq: Once | ORAL | Status: AC | PRN

## 2014-02-23 MED ORDER — SODIUM CHLORIDE 0.9 % IV SOLN
INTRAVENOUS | Status: DC
  Filled 2014-02-23 (×2): qty 30

## 2014-02-23 MED ORDER — CHLORHEXIDINE GLUCONATE 4 % EX LIQD
CUTANEOUS | Status: AC
  Administered 2014-02-23: 21:00:00
  Filled 2014-02-23: qty 15

## 2014-02-23 MED ORDER — CHLORHEXIDINE GLUCONATE CLOTH 2 % EX PADS: 6.0000 | MEDICATED_PAD | Freq: Once | CUTANEOUS | Status: DC

## 2014-02-23 MED ORDER — CHLORHEXIDINE GLUCONATE CLOTH 2 % EX PADS
6.0000 | MEDICATED_PAD | Freq: Once | CUTANEOUS | Status: AC
  Administered 2014-02-24: 6 via TOPICAL

## 2014-02-23 MED ORDER — DEXMEDETOMIDINE HCL IN NACL 400 MCG/100ML IV SOLN
0.1000 ug/kg/h | INTRAVENOUS | Status: AC
  Administered 2014-02-24: 0.2 ug/kg/h via INTRAVENOUS
  Filled 2014-02-23 (×2): qty 100

## 2014-02-23 MED ORDER — PLASMA-LYTE 148 IV SOLN
INTRAVENOUS | Status: AC
  Administered 2014-02-24: 10:00:00
  Filled 2014-02-23 (×2): qty 2.5

## 2014-02-23 MED ORDER — DEXTROSE 5 % IV SOLN
750.0000 mg | INTRAVENOUS | Status: DC
  Filled 2014-02-23 (×2): qty 750

## 2014-02-23 MED ORDER — EPINEPHRINE HCL 1 MG/ML IJ SOLN
0.5000 ug/min | INTRAVENOUS | Status: DC
  Filled 2014-02-23 (×2): qty 4

## 2014-02-23 MED ORDER — SODIUM CHLORIDE 0.9 % IV SOLN
INTRAVENOUS | Status: AC
  Administered 2014-02-24: 1 [IU]/h via INTRAVENOUS
  Filled 2014-02-23 (×2): qty 1

## 2014-02-23 MED ORDER — METOPROLOL TARTRATE 12.5 MG HALF TABLET
12.5000 mg | ORAL_TABLET | Freq: Once | ORAL | Status: DC
  Filled 2014-02-23: qty 1

## 2014-02-23 NOTE — Progress Notes (Signed)
ANTICOAGULATION CONSULT NOTE - Follow Up Consult  Pharmacy Consult for heparin Indication: CAD awaiting CABG  No Known Allergies Patient Measurements: Height: 5\' 10"  (177.8 cm) Weight: 255 lb (115.667 kg) IBW/kg (Calculated) : 73 Heparin Dosing Weight: 98 kg Vital Signs: Temp: 98.8 F (37.1 C) (03/01 0508) Temp src: Oral (03/01 0508) BP: 120/60 mmHg (03/01 0929) Pulse Rate: 72 (03/01 0508) Labs:  Recent Labs  02/21/14 0602  02/21/14 1210  02/22/14 0850 02/22/14 1820 02/23/14 0425  HGB  --   < > 12.4*  --  13.1  --  12.1*  HCT  --   --  38.0*  --  39.8  --  37.0*  PLT  --   --  136*  --  141*  --  134*  LABPROT 13.1  --   --   --   --   --   --   INR 1.01  --   --   --   --   --   --   HEPARINUNFRC  --   --   --   < > 0.30 0.39 0.46  CREATININE  --   --   --   --   --   --  0.91  < > = values in this interval not displayed.  Estimated Creatinine Clearance: 94.9 ml/min (by C-G formula based on Cr of 0.91).  Medications:  Scheduled:  . amLODipine  10 mg Oral Daily  . aspirin  81 mg Oral Daily  . atorvastatin  10 mg Oral Daily  . carvedilol  37.5 mg Oral BID WC  . furosemide  40 mg Oral Daily  . glipiZIDE  5 mg Oral Q breakfast  . insulin aspart  0-20 Units Subcutaneous TID WC  . lisinopril  40 mg Oral Daily  . multivitamin with minerals  1 tablet Oral Daily  . potassium chloride SA  20 mEq Oral Daily   Infusions:  . heparin 1,700 Units/hr (02/23/14 0116)    Assessment: 73 yo male with CAD awaiting CABG (planned for 3/2) is currently on therapeutic heparin.  Heparin level is therapeutic. CBC stable - note platelets are low at 134 but stable since admission. No bleeding reported.   Goal of Therapy:  Heparin level 0.3-0.7 units/ml Monitor platelets by anticoagulation protocol: Yes   Plan:  1) Continue heparin at 1700 units/hr 2) Heparin level and CBC in am  Sloan Leiter, PharmD, BCPS Clinical Pharmacist [phone] 02/23/2014,9:59 AM

## 2014-02-23 NOTE — Progress Notes (Signed)
2 Days Post-Op Procedure(s) (LRB): LEFT HEART CATHETERIZATION WITH CORONARY ANGIOGRAM (N/A) Subjective:  Stable day with no chest pain or dyspnea. Ambulated in the hall. Remains on heparin  Objective: Vital signs in last 24 hours: Temp:  [98 F (36.7 C)-98.8 F (37.1 C)] 98.1 F (36.7 C) (03/01 1334) Pulse Rate:  [63-72] 71 (03/01 1334) Cardiac Rhythm:  [-] Normal sinus rhythm (03/01 1558) Resp:  [16-18] 18 (03/01 1334) BP: (118-129)/(59-76) 129/76 mmHg (03/01 1334) SpO2:  [96 %-97 %] 97 % (03/01 1334)  Hemodynamic parameters for last 24 hours:    Intake/Output from previous day: 02/28 0701 - 03/01 0700 In: 460 [P.O.:460] Out: -  Intake/Output this shift: Total I/O In: 720 [P.O.:720] Out: -   General appearance: alert and cooperative Heart: regular rate and rhythm, S1, S2 normal, no murmur, click, rub or gallop Lungs: clear to auscultation bilaterally  Lab Results:  Recent Labs  02/22/14 0850 02/23/14 0425  WBC 8.2 7.9  HGB 13.1 12.1*  HCT 39.8 37.0*  PLT 141* 134*   BMET:  Recent Labs  02/23/14 0425  NA 143  K 3.9  CL 102  CO2 27  GLUCOSE 194*  BUN 24*  CREATININE 0.91  CALCIUM 9.1    PT/INR:  Recent Labs  02/21/14 0602  LABPROT 13.1  INR 1.01   ABG No results found for this basename: phart, pco2, po2, hco3, tco2, acidbasedef, o2sat   CBG (last 3)   Recent Labs  02/22/14 2021 02/23/14 0740 02/23/14 1159  GLUCAP 128* 174* 170*    Assessment/Plan:  Severe LM and 3 vessel CAD, Newly diagnosed A-fib. Plan CABG and clipping of LAA tomorrow. I discussed the operative procedure with the patient and family including alternatives, benefits and risks; including but not limited to bleeding, blood transfusion, infection, stroke, myocardial infarction, graft failure, heart block requiring a permanent pacemaker, organ dysfunction, and death.  Seth [name] understands and agrees to proceed.    Seth [name] 02/23/2014

## 2014-02-23 NOTE — Progress Notes (Signed)
     Primary cardiologist: Dr. Lauree Chandler   Subjective:    Up in room. No chest pain or breathlessness.  Objective:   Temp:  [97 F (36.1 C)-98.8 F (37.1 C)] 98.8 F (37.1 C) (03/01 0508) Pulse Rate:  [60-72] 72 (03/01 0508) Resp:  [16-17] 16 (03/01 0508) BP: (110-120)/(54-67) 118/59 mmHg (03/01 0508) SpO2:  [96 %-97 %] 96 % (03/01 0508) Last BM Date: 02/22/14  Filed Weights   02/21/14 0603  Weight: 255 lb (115.667 kg)    Intake/Output Summary (Last 24 hours) at 02/23/14 0825 Last data filed at 02/22/14 0845  Gross per 24 hour  Intake    460 ml  Output      0 ml  Net    460 ml    Telemetry: Sinus rhythm, intermittent sinus bradycardia noted as well.  Exam:  General: No distress.  Lungs: Clear, nonlabored.  Cardiac: RRR, no gallop.  Extremities: No pitting.   Lab Results:  CBC:  Recent Labs Lab 02/21/14 1210 02/22/14 0850 02/23/14 0425  WBC 8.6 8.2 7.9  HGB 12.4* 13.1 12.1*  HCT 38.0* 39.8 37.0*  MCV 85.8 85.8 86.0  PLT 136* 141* 134*    Echocardiogram (2/23): Study Conclusions  - Left ventricle: The cavity size was normal. Wall thickness was normal. Systolic function was moderately reduced. The estimated ejection fraction was in the range of 35% to 40%. There is akinesis of the mid-distalanteroseptal and apical myocardium. Features are consistent with a pseudonormal left ventricular filling pattern, with concomitant abnormal relaxation and increased filling pressure (grade 2 diastolic dysfunction). Doppler parameters are consistent with high ventricular filling pressure. - Mitral valve: Calcified annulus. - Left atrium: The atrium was moderately dilated.    Medications:   Scheduled Medications: . amLODipine  10 mg Oral Daily  . aspirin  81 mg Oral Daily  . atorvastatin  10 mg Oral Daily  . carvedilol  37.5 mg Oral BID WC  . furosemide  40 mg Oral Daily  . glipiZIDE  5 mg Oral Q breakfast  . insulin aspart  0-20 Units  Subcutaneous TID WC  . lisinopril  40 mg Oral Daily  . multivitamin with minerals  1 tablet Oral Daily  . potassium chloride SA  20 mEq Oral Daily    Infusions: . heparin 1,700 Units/hr (02/23/14 0116)    PRN Medications: nitroGLYCERIN   Assessment:   1. Severe multivessel/LM CAD documented at cardiac catheterization 2/27.  2. Ischemic cardiomyopathy, LVEF 35-40% by recent echocardiogram.  3. Recently documented atrial fibrillation, rate controlled. Not on oral anticoagulant at this time given recent and anticipated invasive testing/procedures. He is on heparin.  4. Hyperlipidemia, on statin therapy.  5. Hypertension.  6. Type 2 diabetes mellitus.   Plan/Discussion:    Plan for CABG and left atrial clipping on Monday per Dr. Cyndia Bent. Continue current medications.   Satira Sark, M.D., F.A.C.C.

## 2014-02-24 ENCOUNTER — Inpatient Hospital Stay (HOSPITAL_COMMUNITY): Payer: Medicare Other | Admitting: Critical Care Medicine

## 2014-02-24 ENCOUNTER — Encounter (HOSPITAL_COMMUNITY): Payer: Self-pay | Admitting: *Deleted

## 2014-02-24 ENCOUNTER — Encounter (HOSPITAL_COMMUNITY)
Admission: RE | Disposition: A | Discharge status: Home / Self Care | Payer: Medicare Other | Source: Ambulatory Visit | Attending: Surgery

## 2014-02-24 ENCOUNTER — Inpatient Hospital Stay (HOSPITAL_COMMUNITY): Payer: Medicare Other

## 2014-02-24 ENCOUNTER — Encounter (HOSPITAL_COMMUNITY): Payer: Medicare Other | Admitting: Critical Care Medicine

## 2014-02-24 DIAGNOSIS — Z951 Presence of aortocoronary bypass graft: Secondary | ICD-10-CM

## 2014-02-24 DIAGNOSIS — I2510 Atherosclerotic heart disease of native coronary artery without angina pectoris: Secondary | ICD-10-CM | POA: Diagnosis not present

## 2014-02-24 HISTORY — PX: CLIPPING OF ATRIAL APPENDAGE: SHX5773

## 2014-02-24 HISTORY — PX: INTRAOPERATIVE TRANSESOPHAGEAL ECHOCARDIOGRAM: SHX5062

## 2014-02-24 HISTORY — PX: CORONARY ARTERY BYPASS GRAFT: SHX141

## 2014-02-24 LAB — MAGNESIUM: Magnesium: 2.9 mg/dL — ABNORMAL HIGH (ref 1.5–2.5)

## 2014-02-24 LAB — HEPARIN LEVEL (UNFRACTIONATED): Heparin Unfractionated: 0.32 IU/mL (ref 0.30–0.70)

## 2014-02-24 LAB — POCT I-STAT GLUCOSE
Glucose, Bld: 152 mg/dL — ABNORMAL HIGH (ref 70–99)
Operator id: 286891

## 2014-02-24 LAB — POCT I-STAT 4, (NA,K, GLUC, HGB,HCT)
Glucose, Bld: 174 mg/dL — ABNORMAL HIGH (ref 70–99)
Glucose, Bld: 146 mg/dL — ABNORMAL HIGH (ref 70–99)
Glucose, Bld: 120 mg/dL — ABNORMAL HIGH (ref 70–99)
Glucose, Bld: 128 mg/dL — ABNORMAL HIGH (ref 70–99)
Glucose, Bld: 181 mg/dL — ABNORMAL HIGH (ref 70–99)
Glucose, Bld: 158 mg/dL — ABNORMAL HIGH (ref 70–99)
HCT: 36 % — ABNORMAL LOW (ref 39.0–52.0)
HCT: 35 % — ABNORMAL LOW (ref 39.0–52.0)
HCT: 26 % — ABNORMAL LOW (ref 39.0–52.0)
HCT: 27 % — ABNORMAL LOW (ref 39.0–52.0)
HCT: 25 % — ABNORMAL LOW (ref 39.0–52.0)
HCT: 34 % — ABNORMAL LOW (ref 39.0–52.0)
Hemoglobin: 12.2 g/dL — ABNORMAL LOW (ref 13.0–17.0)
Hemoglobin: 11.9 g/dL — ABNORMAL LOW (ref 13.0–17.0)
Hemoglobin: 8.8 g/dL — ABNORMAL LOW (ref 13.0–17.0)
Hemoglobin: 9.2 g/dL — ABNORMAL LOW (ref 13.0–17.0)
Hemoglobin: 8.5 g/dL — ABNORMAL LOW (ref 13.0–17.0)
Hemoglobin: 11.6 g/dL — ABNORMAL LOW (ref 13.0–17.0)
Potassium: 4.1 mEq/L (ref 3.7–5.3)
Potassium: 3.7 mEq/L (ref 3.7–5.3)
Potassium: 4.1 mEq/L (ref 3.7–5.3)
Potassium: 4.2 mEq/L (ref 3.7–5.3)
Potassium: 4.2 mEq/L (ref 3.7–5.3)
Potassium: 3.9 mEq/L (ref 3.7–5.3)
Sodium: 140 mEq/L (ref 137–147)
Sodium: 140 mEq/L (ref 137–147)
Sodium: 138 mEq/L (ref 137–147)
Sodium: 138 mEq/L (ref 137–147)
Sodium: 138 mEq/L (ref 137–147)
Sodium: 138 mEq/L (ref 137–147)

## 2014-02-24 LAB — CBC
HCT: 35.3 % — ABNORMAL LOW (ref 39.0–52.0)
HCT: 35.6 % — ABNORMAL LOW (ref 39.0–52.0)
HCT: 34.5 % — ABNORMAL LOW (ref 39.0–52.0)
Hemoglobin: 11.7 g/dL — ABNORMAL LOW (ref 13.0–17.0)
Hemoglobin: 12.1 g/dL — ABNORMAL LOW (ref 13.0–17.0)
Hemoglobin: 11.8 g/dL — ABNORMAL LOW (ref 13.0–17.0)
MCH: 28.3 pg (ref 26.0–34.0)
MCH: 28.8 pg (ref 26.0–34.0)
MCH: 28.9 pg (ref 26.0–34.0)
MCHC: 33.1 g/dL (ref 30.0–36.0)
MCHC: 34 g/dL (ref 30.0–36.0)
MCHC: 34.2 g/dL (ref 30.0–36.0)
MCV: 85.5 fL (ref 78.0–100.0)
MCV: 84.8 fL (ref 78.0–100.0)
MCV: 84.4 fL (ref 78.0–100.0)
Platelets: 124 10*3/uL — ABNORMAL LOW (ref 150–400)
Platelets: 130 10*3/uL — ABNORMAL LOW (ref 150–400)
Platelets: 128 10*3/uL — ABNORMAL LOW (ref 150–400)
RBC: 4.13 MIL/uL — ABNORMAL LOW (ref 4.22–5.81)
RBC: 4.2 MIL/uL — ABNORMAL LOW (ref 4.22–5.81)
RBC: 4.09 MIL/uL — ABNORMAL LOW (ref 4.22–5.81)
RDW: 14 % (ref 11.5–15.5)
RDW: 14.1 % (ref 11.5–15.5)
RDW: 14.2 % (ref 11.5–15.5)
WBC: 7.6 10*3/uL (ref 4.0–10.5)
WBC: 16.6 10*3/uL — ABNORMAL HIGH (ref 4.0–10.5)
WBC: 14.7 10*3/uL — ABNORMAL HIGH (ref 4.0–10.5)

## 2014-02-24 LAB — POCT I-STAT 3, ART BLOOD GAS (G3+)
Acid-Base Excess: 4 mmol/L — ABNORMAL HIGH (ref 0.0–2.0)
Bicarbonate: 29.3 mEq/L — ABNORMAL HIGH (ref 20.0–24.0)
Bicarbonate: 24.1 mEq/L — ABNORMAL HIGH (ref 20.0–24.0)
O2 Saturation: 100 %
O2 Saturation: 93 %
Patient temperature: 36.5
TCO2: 31 mmol/L (ref 0–100)
TCO2: 25 mmol/L (ref 0–100)
pCO2 arterial: 46.1 mmHg — ABNORMAL HIGH (ref 35.0–45.0)
pCO2 arterial: 34.2 mmHg — ABNORMAL LOW (ref 35.0–45.0)
pH, Arterial: 7.41 (ref 7.350–7.450)
pH, Arterial: 7.454 — ABNORMAL HIGH (ref 7.350–7.450)
pO2, Arterial: 329 mmHg — ABNORMAL HIGH (ref 80.0–100.0)
pO2, Arterial: 61 mmHg — ABNORMAL LOW (ref 80.0–100.0)

## 2014-02-24 LAB — BASIC METABOLIC PANEL
BUN: 20 mg/dL (ref 6–23)
CO2: 27 mEq/L (ref 19–32)
Calcium: 9.1 mg/dL (ref 8.4–10.5)
Chloride: 99 mEq/L (ref 96–112)
Creatinine, Ser: 0.84 mg/dL (ref 0.50–1.35)
GFR calc Af Amer: >90 mL/min (ref >90)
GFR calc non Af Amer: 86 mL/min — ABNORMAL LOW (ref >90)
Glucose, Bld: 156 mg/dL — ABNORMAL HIGH (ref 70–99)
Potassium: 3.6 mEq/L — ABNORMAL LOW (ref 3.7–5.3)
Sodium: 138 mEq/L (ref 137–147)

## 2014-02-24 LAB — GLUCOSE, CAPILLARY
Glucose-Capillary: 200 mg/dL — ABNORMAL HIGH (ref 70–99)
Glucose-Capillary: 150 mg/dL — ABNORMAL HIGH (ref 70–99)

## 2014-02-24 LAB — PROTIME-INR
INR: 1.25 (ref 0.00–1.49)
Prothrombin Time: 15.4 seconds — ABNORMAL HIGH (ref 11.6–15.2)

## 2014-02-24 LAB — CREATININE, SERUM
Creatinine, Ser: 0.81 mg/dL (ref 0.50–1.35)
GFR calc Af Amer: >90 mL/min (ref >90)
GFR calc non Af Amer: 87 mL/min — ABNORMAL LOW (ref >90)

## 2014-02-24 LAB — PLATELET COUNT: Platelets: 89 10*3/uL — ABNORMAL LOW (ref 150–400)

## 2014-02-24 LAB — APTT: aPTT: 29 seconds (ref 24–37)

## 2014-02-24 SURGERY — CORONARY ARTERY BYPASS GRAFTING (CABG)
Anesthesia: General | Site: Chest

## 2014-02-24 MED ORDER — DOCUSATE SODIUM 100 MG PO CAPS
200.0000 mg | ORAL_CAPSULE | Freq: Every day | ORAL | Status: DC
  Administered 2014-02-25 – 2014-02-26 (×2): 200 mg via ORAL
  Filled 2014-02-24 (×2): qty 2

## 2014-02-24 MED ORDER — PROTAMINE SULFATE 10 MG/ML IV SOLN
INTRAVENOUS | Status: DC | PRN
  Administered 2014-02-24: 300 mg via INTRAVENOUS

## 2014-02-24 MED ORDER — SODIUM CHLORIDE 0.9 % IV SOLN
INTRAVENOUS | Status: DC
  Administered 2014-02-24: 20 mL/h via INTRAVENOUS

## 2014-02-24 MED ORDER — PROTAMINE SULFATE 10 MG/ML IV SOLN
INTRAVENOUS | Status: AC
  Filled 2014-02-24: qty 25

## 2014-02-24 MED ORDER — PROTAMINE SULFATE 10 MG/ML IV SOLN
INTRAVENOUS | Status: AC
  Filled 2014-02-24: qty 5

## 2014-02-24 MED ORDER — MIDAZOLAM HCL 2 MG/2ML IJ SOLN
INTRAMUSCULAR | Status: AC
  Filled 2014-02-24: qty 2

## 2014-02-24 MED ORDER — SODIUM CHLORIDE 0.9 % IJ SOLN
INTRAMUSCULAR | Status: AC
  Filled 2014-02-24: qty 10

## 2014-02-24 MED ORDER — LACTATED RINGERS IV SOLN: INTRAVENOUS | Status: DC

## 2014-02-24 MED ORDER — MIDAZOLAM HCL 2 MG/2ML IJ SOLN: 2.0000 mg | INTRAMUSCULAR | Status: DC | PRN

## 2014-02-24 MED ORDER — ALBUMIN HUMAN 5 % IV SOLN: 250.0000 mL | INTRAVENOUS | Status: AC | PRN

## 2014-02-24 MED ORDER — METOCLOPRAMIDE HCL 5 MG/ML IJ SOLN
10.0000 mg | Freq: Four times a day (QID) | INTRAMUSCULAR | Status: AC
  Administered 2014-02-24 – 2014-02-25 (×4): 10 mg via INTRAVENOUS
  Filled 2014-02-24 (×4): qty 2

## 2014-02-24 MED ORDER — BISACODYL 10 MG RE SUPP: 10.0000 mg | Freq: Every day | RECTAL | Status: DC

## 2014-02-24 MED ORDER — PHENYLEPHRINE HCL 10 MG/ML IJ SOLN
INTRAMUSCULAR | Status: AC
  Filled 2014-02-24: qty 1

## 2014-02-24 MED ORDER — LACTATED RINGERS IV SOLN: 500.0000 mL | Freq: Once | INTRAVENOUS | Status: AC | PRN

## 2014-02-24 MED ORDER — SUCCINYLCHOLINE CHLORIDE 20 MG/ML IJ SOLN
INTRAMUSCULAR | Status: AC
  Filled 2014-02-24: qty 1

## 2014-02-24 MED ORDER — SODIUM CHLORIDE 0.45 % IV SOLN
INTRAVENOUS | Status: DC
  Administered 2014-02-24: 20 mL/h via INTRAVENOUS

## 2014-02-24 MED ORDER — HEMOSTATIC AGENTS (NO CHARGE) OPTIME
TOPICAL | Status: DC | PRN
  Administered 2014-02-24 (×2): 1 via TOPICAL

## 2014-02-24 MED ORDER — MORPHINE SULFATE 2 MG/ML IJ SOLN: 1.0000 mg | INTRAMUSCULAR | Status: AC | PRN

## 2014-02-24 MED ORDER — ROCURONIUM BROMIDE 50 MG/5ML IV SOLN
INTRAVENOUS | Status: AC
  Filled 2014-02-24: qty 1

## 2014-02-24 MED ORDER — SODIUM CHLORIDE 0.9 % IJ SOLN: 3.0000 mL | INTRAMUSCULAR | Status: DC | PRN

## 2014-02-24 MED ORDER — ASPIRIN 81 MG PO CHEW: 324.0000 mg | CHEWABLE_TABLET | Freq: Every day | ORAL | Status: DC

## 2014-02-24 MED ORDER — HEPARIN SODIUM (PORCINE) 1000 UNIT/ML IJ SOLN
INTRAMUSCULAR | Status: DC | PRN
  Administered 2014-02-24: 30000 [IU] via INTRAVENOUS

## 2014-02-24 MED ORDER — INSULIN REGULAR BOLUS VIA INFUSION
0.0000 [IU] | Freq: Three times a day (TID) | INTRAVENOUS | Status: DC
  Filled 2014-02-24: qty 10

## 2014-02-24 MED ORDER — DEXMEDETOMIDINE HCL IN NACL 200 MCG/50ML IV SOLN
0.1000 ug/kg/h | INTRAVENOUS | Status: DC
  Administered 2014-02-24: 0.7 ug/kg/h via INTRAVENOUS

## 2014-02-24 MED ORDER — SODIUM CHLORIDE 0.9 % IV SOLN
INTRAVENOUS | Status: DC
  Administered 2014-02-24: via INTRAVENOUS
  Administered 2014-02-24: 3.2 [IU]/h via INTRAVENOUS
  Filled 2014-02-24 (×2): qty 1

## 2014-02-24 MED ORDER — METOPROLOL TARTRATE 12.5 MG HALF TABLET
12.5000 mg | ORAL_TABLET | Freq: Two times a day (BID) | ORAL | Status: DC
  Filled 2014-02-24 (×3): qty 1

## 2014-02-24 MED ORDER — DEXMEDETOMIDINE HCL IN NACL 400 MCG/100ML IV SOLN
0.4000 ug/kg/h | INTRAVENOUS | Status: DC
Start: 2014-02-24 — End: 2014-02-24
  Filled 2014-02-24: qty 100

## 2014-02-24 MED ORDER — ROCURONIUM BROMIDE 100 MG/10ML IV SOLN
INTRAVENOUS | Status: DC | PRN
  Administered 2014-02-24 (×4): 50 mg via INTRAVENOUS

## 2014-02-24 MED ORDER — METOPROLOL TARTRATE 1 MG/ML IV SOLN: 2.5000 mg | INTRAVENOUS | Status: DC | PRN

## 2014-02-24 MED ORDER — OXYCODONE HCL 5 MG PO TABS
5.0000 mg | ORAL_TABLET | ORAL | Status: DC | PRN
Start: 2014-02-24 — End: 2014-02-26

## 2014-02-24 MED ORDER — PROPOFOL 10 MG/ML IV BOLUS
INTRAVENOUS | Status: DC | PRN
  Administered 2014-02-24: 20 mg via INTRAVENOUS

## 2014-02-24 MED ORDER — ACETAMINOPHEN 500 MG PO TABS
1000.0000 mg | ORAL_TABLET | Freq: Four times a day (QID) | ORAL | Status: DC
  Administered 2014-02-25 – 2014-02-26 (×5): 1000 mg via ORAL
  Filled 2014-02-24 (×9): qty 2

## 2014-02-24 MED ORDER — FAMOTIDINE IN NACL 20-0.9 MG/50ML-% IV SOLN
20.0000 mg | Freq: Two times a day (BID) | INTRAVENOUS | Status: AC
  Administered 2014-02-24 (×2): 20 mg via INTRAVENOUS
  Filled 2014-02-24: qty 50

## 2014-02-24 MED ORDER — SODIUM CHLORIDE 0.9 % IV SOLN: 250.0000 mL | INTRAVENOUS | Status: DC

## 2014-02-24 MED ORDER — BISACODYL 5 MG PO TBEC
10.0000 mg | DELAYED_RELEASE_TABLET | Freq: Every day | ORAL | Status: DC
Start: 2014-02-25 — End: 2014-02-26
  Administered 2014-02-25 – 2014-02-26 (×2): 10 mg via ORAL
  Filled 2014-02-24 (×2): qty 2

## 2014-02-24 MED ORDER — MORPHINE SULFATE 2 MG/ML IJ SOLN: 2.0000 mg | INTRAMUSCULAR | Status: DC | PRN

## 2014-02-24 MED ORDER — FENTANYL CITRATE 0.05 MG/ML IJ SOLN
INTRAMUSCULAR | Status: AC
  Filled 2014-02-24: qty 5

## 2014-02-24 MED ORDER — HEPARIN SODIUM (PORCINE) 1000 UNIT/ML IJ SOLN
INTRAMUSCULAR | Status: AC
  Filled 2014-02-24: qty 1

## 2014-02-24 MED ORDER — LACTATED RINGERS IV SOLN
INTRAVENOUS | Status: DC | PRN
  Administered 2014-02-24: 07:00:00 via INTRAVENOUS

## 2014-02-24 MED ORDER — SODIUM CHLORIDE 0.9 % IJ SOLN
3.0000 mL | Freq: Two times a day (BID) | INTRAMUSCULAR | Status: DC
  Administered 2014-02-25 (×2): 3 mL via INTRAVENOUS

## 2014-02-24 MED ORDER — ONDANSETRON HCL 4 MG/2ML IJ SOLN: 4.0000 mg | Freq: Four times a day (QID) | INTRAMUSCULAR | Status: DC | PRN

## 2014-02-24 MED ORDER — MIDAZOLAM HCL 5 MG/5ML IJ SOLN
INTRAMUSCULAR | Status: DC | PRN
  Administered 2014-02-24: 2 mg via INTRAVENOUS
  Administered 2014-02-24: 5 mg via INTRAVENOUS
  Administered 2014-02-24: 2 mg via INTRAVENOUS
  Administered 2014-02-24 (×2): 5 mg via INTRAVENOUS
  Administered 2014-02-24: 3 mg via INTRAVENOUS

## 2014-02-24 MED ORDER — PANTOPRAZOLE SODIUM 40 MG PO TBEC
40.0000 mg | DELAYED_RELEASE_TABLET | Freq: Every day | ORAL | Status: DC
  Administered 2014-02-26: 40 mg via ORAL
  Filled 2014-02-24 (×2): qty 1

## 2014-02-24 MED ORDER — MAGNESIUM SULFATE 4000MG/100ML IJ SOLN
4.0000 g | Freq: Once | INTRAMUSCULAR | Status: AC
  Administered 2014-02-24: 4 g via INTRAVENOUS
  Filled 2014-02-24: qty 100

## 2014-02-24 MED ORDER — SODIUM CHLORIDE 0.9 % IV SOLN
1.0000 g/h | INTRAVENOUS | Status: DC
  Filled 2014-02-24: qty 20

## 2014-02-24 MED ORDER — THROMBIN 20000 UNITS EX SOLR
OROMUCOSAL | Status: DC | PRN
  Administered 2014-02-24 (×3): via TOPICAL

## 2014-02-24 MED ORDER — MIDAZOLAM HCL 10 MG/2ML IJ SOLN
INTRAMUSCULAR | Status: AC
  Filled 2014-02-24: qty 2

## 2014-02-24 MED ORDER — THROMBIN 20000 UNITS EX SOLR
CUTANEOUS | Status: AC
  Filled 2014-02-24: qty 20000

## 2014-02-24 MED ORDER — EPHEDRINE SULFATE 50 MG/ML IJ SOLN
INTRAMUSCULAR | Status: AC
  Filled 2014-02-24: qty 1

## 2014-02-24 MED ORDER — DEXTROSE 5 % IV SOLN
1.5000 g | Freq: Two times a day (BID) | INTRAVENOUS | Status: AC
  Administered 2014-02-24 – 2014-02-26 (×4): 1.5 g via INTRAVENOUS
  Filled 2014-02-24 (×5): qty 1.5

## 2014-02-24 MED ORDER — SUCCINYLCHOLINE CHLORIDE 20 MG/ML IJ SOLN
INTRAMUSCULAR | Status: DC | PRN
  Administered 2014-02-24: 120 mg via INTRAVENOUS

## 2014-02-24 MED ORDER — LIDOCAINE HCL (CARDIAC) 20 MG/ML IV SOLN
INTRAVENOUS | Status: AC
  Filled 2014-02-24: qty 5

## 2014-02-24 MED ORDER — ACETAMINOPHEN 650 MG RE SUPP
650.0000 mg | Freq: Once | RECTAL | Status: AC
  Administered 2014-02-24: 650 mg via RECTAL

## 2014-02-24 MED ORDER — PHENYLEPHRINE HCL 10 MG/ML IJ SOLN
10.0000 mg | INTRAMUSCULAR | Status: DC | PRN
  Administered 2014-02-24: 50 ug/min via INTRAVENOUS

## 2014-02-24 MED ORDER — ASPIRIN EC 325 MG PO TBEC
325.0000 mg | DELAYED_RELEASE_TABLET | Freq: Every day | ORAL | Status: DC
  Administered 2014-02-25 – 2014-02-26 (×2): 325 mg via ORAL
  Filled 2014-02-24 (×2): qty 1

## 2014-02-24 MED ORDER — FENTANYL CITRATE 0.05 MG/ML IJ SOLN
INTRAMUSCULAR | Status: DC | PRN
  Administered 2014-02-24: 500 ug via INTRAVENOUS
  Administered 2014-02-24 (×3): 250 ug via INTRAVENOUS

## 2014-02-24 MED ORDER — ACETAMINOPHEN 160 MG/5ML PO SOLN: 650.0000 mg | Freq: Once | ORAL | Status: AC

## 2014-02-24 MED ORDER — PHENYLEPHRINE HCL 10 MG/ML IJ SOLN
30.0000 ug/min | INTRAVENOUS | Status: DC
  Filled 2014-02-24: qty 2

## 2014-02-24 MED ORDER — 0.9 % SODIUM CHLORIDE (POUR BTL) OPTIME
TOPICAL | Status: DC | PRN
  Administered 2014-02-24: 5000 mL

## 2014-02-24 MED ORDER — LIDOCAINE HCL (CARDIAC) 20 MG/ML IV SOLN
INTRAVENOUS | Status: DC | PRN
  Administered 2014-02-24: 30 mg via INTRAVENOUS

## 2014-02-24 MED ORDER — POTASSIUM CHLORIDE 10 MEQ/50ML IV SOLN
10.0000 meq | INTRAVENOUS | Status: AC
  Administered 2014-02-24 (×3): 10 meq via INTRAVENOUS

## 2014-02-24 MED ORDER — PROPOFOL 10 MG/ML IV BOLUS
INTRAVENOUS | Status: AC
  Filled 2014-02-24: qty 20

## 2014-02-24 MED ORDER — ARTIFICIAL TEARS OP OINT
TOPICAL_OINTMENT | OPHTHALMIC | Status: DC | PRN
  Administered 2014-02-24: 1 via OPHTHALMIC

## 2014-02-24 MED ORDER — VANCOMYCIN HCL IN DEXTROSE 1-5 GM/200ML-% IV SOLN
1000.0000 mg | Freq: Once | INTRAVENOUS | Status: AC
  Administered 2014-02-24: 1000 mg via INTRAVENOUS
  Filled 2014-02-24: qty 200

## 2014-02-24 MED ORDER — ACETAMINOPHEN 160 MG/5ML PO SOLN
1000.0000 mg | Freq: Four times a day (QID) | ORAL | Status: DC
  Administered 2014-02-24: 1000 mg
  Filled 2014-02-24: qty 40.6

## 2014-02-24 MED ORDER — PHENYLEPHRINE HCL 10 MG/ML IJ SOLN
0.0000 ug/min | INTRAVENOUS | Status: DC
  Administered 2014-02-24: 40 ug/min via INTRAVENOUS
  Administered 2014-02-24: 65 ug/min via INTRAVENOUS
  Filled 2014-02-24 (×2): qty 2

## 2014-02-24 MED ORDER — ALBUTEROL SULFATE HFA 108 (90 BASE) MCG/ACT IN AERS
INHALATION_SPRAY | RESPIRATORY_TRACT | Status: AC
  Filled 2014-02-24: qty 6.7

## 2014-02-24 MED ORDER — METOPROLOL TARTRATE 25 MG/10 ML ORAL SUSPENSION
12.5000 mg | Freq: Two times a day (BID) | ORAL | Status: DC
  Filled 2014-02-24 (×3): qty 5

## 2014-02-24 MED ORDER — ARTIFICIAL TEARS OP OINT
TOPICAL_OINTMENT | OPHTHALMIC | Status: AC
  Filled 2014-02-24: qty 3.5

## 2014-02-24 MED ORDER — NITROGLYCERIN IN D5W 200-5 MCG/ML-% IV SOLN: 0.0000 ug/min | INTRAVENOUS | Status: DC

## 2014-02-24 MED ORDER — ALBUTEROL SULFATE HFA 108 (90 BASE) MCG/ACT IN AERS
INHALATION_SPRAY | RESPIRATORY_TRACT | Status: DC | PRN
  Administered 2014-02-24: 2 via RESPIRATORY_TRACT

## 2014-02-24 SURGICAL SUPPLY — 108 items
ADH SKN CLS APL DERMABOND .7 (GAUZE/BANDAGES/DRESSINGS) ×9
ATRICLIP EXCLUSION 40 STD HAND (Clip) ×1 IMPLANT
ATTRACTOMAT 16X20 MAGNETIC DRP (DRAPES) ×4 IMPLANT
BAG DECANTER FOR FLEXI CONT (MISCELLANEOUS) ×4 IMPLANT
BANDAGE ELASTIC 4 VELCRO ST LF (GAUZE/BANDAGES/DRESSINGS) ×4 IMPLANT
BANDAGE ELASTIC 6 VELCRO ST LF (GAUZE/BANDAGES/DRESSINGS) ×4 IMPLANT
BANDAGE GAUZE ELAST BULKY 4 IN (GAUZE/BANDAGES/DRESSINGS) ×4 IMPLANT
BASKET HEART (ORDER IN 25'S) (MISCELLANEOUS) ×1
BASKET HEART (ORDER IN 25S) (MISCELLANEOUS) ×3 IMPLANT
BLADE STERNUM SYSTEM 6 (BLADE) ×4 IMPLANT
CANISTER SUCTION 2500CC (MISCELLANEOUS) ×4 IMPLANT
CANNULA ARTERIAL NVNT 3/8 22FR (MISCELLANEOUS) ×1 IMPLANT
CARDIAC SUCTION (MISCELLANEOUS) ×4 IMPLANT
CATH ROBINSON RED A/P 18FR (CATHETERS) ×8 IMPLANT
CATH THORACIC 28FR (CATHETERS) ×4 IMPLANT
CATH THORACIC 36FR (CATHETERS) ×4 IMPLANT
CATH THORACIC 36FR RT ANG (CATHETERS) ×4 IMPLANT
CLIP TI MEDIUM 24 (CLIP) IMPLANT
CLIP TI WIDE RED SMALL 24 (CLIP) ×1 IMPLANT
COVER SURGICAL LIGHT HANDLE (MISCELLANEOUS) ×4 IMPLANT
CRADLE DONUT ADULT HEAD (MISCELLANEOUS) ×4 IMPLANT
DERMABOND ADVANCED (GAUZE/BANDAGES/DRESSINGS) ×3
DERMABOND ADVANCED .7 DNX12 (GAUZE/BANDAGES/DRESSINGS) IMPLANT
DRAPE CARDIOVASCULAR INCISE (DRAPES) ×4
DRAPE SLUSH MACHINE 52X66 (DRAPES) ×1 IMPLANT
DRAPE SLUSH/WARMER DISC (DRAPES) ×3 IMPLANT
DRAPE SRG 135X102X78XABS (DRAPES) ×3 IMPLANT
DRSG COVADERM 4X14 (GAUZE/BANDAGES/DRESSINGS) ×4 IMPLANT
ELECT CAUTERY BLADE 6.4 (BLADE) ×4 IMPLANT
ELECT REM PT RETURN 9FT ADLT (ELECTROSURGICAL) ×8
ELECTRODE REM PT RTRN 9FT ADLT (ELECTROSURGICAL) ×6 IMPLANT
GLOVE BIO SURGEON STRL SZ 6 (GLOVE) ×5 IMPLANT
GLOVE BIO SURGEON STRL SZ 6.5 (GLOVE) ×5 IMPLANT
GLOVE BIO SURGEON STRL SZ7 (GLOVE) ×1 IMPLANT
GLOVE BIO SURGEON STRL SZ7.5 (GLOVE) IMPLANT
GLOVE BIOGEL PI IND STRL 6 (GLOVE) IMPLANT
GLOVE BIOGEL PI IND STRL 6.5 (GLOVE) IMPLANT
GLOVE BIOGEL PI IND STRL 7.0 (GLOVE) IMPLANT
GLOVE BIOGEL PI INDICATOR 6 (GLOVE) ×4
GLOVE BIOGEL PI INDICATOR 6.5 (GLOVE) ×2
GLOVE BIOGEL PI INDICATOR 7.0 (GLOVE) ×4
GLOVE EUDERMIC 7 POWDERFREE (GLOVE) ×9 IMPLANT
GLOVE ORTHO TXT STRL SZ7.5 (GLOVE) IMPLANT
GOWN STRL REUS W/ TWL LRG LVL3 (GOWN DISPOSABLE) ×12 IMPLANT
GOWN STRL REUS W/ TWL XL LVL3 (GOWN DISPOSABLE) ×3 IMPLANT
GOWN STRL REUS W/TWL LRG LVL3 (GOWN DISPOSABLE) ×24
GOWN STRL REUS W/TWL XL LVL3 (GOWN DISPOSABLE) ×4
HEMOSTAT POWDER SURGIFOAM 1G (HEMOSTASIS) ×12 IMPLANT
HEMOSTAT SURGICEL 2X14 (HEMOSTASIS) ×4 IMPLANT
INSERT FOGARTY 61MM (MISCELLANEOUS) IMPLANT
INSERT FOGARTY XLG (MISCELLANEOUS) ×1 IMPLANT
KIT BASIN OR (CUSTOM PROCEDURE TRAY) ×4 IMPLANT
KIT CATH CPB BARTLE (MISCELLANEOUS) ×4 IMPLANT
KIT ROOM TURNOVER OR (KITS) ×4 IMPLANT
KIT SUCTION CATH 14FR (SUCTIONS) ×5 IMPLANT
KIT VASOVIEW W/TROCAR VH 2000 (KITS) ×4 IMPLANT
NS IRRIG 1000ML POUR BTL (IV SOLUTION) ×21 IMPLANT
PACK OPEN HEART (CUSTOM PROCEDURE TRAY) ×4 IMPLANT
PAD ARMBOARD 7.5X6 YLW CONV (MISCELLANEOUS) ×8 IMPLANT
PAD ELECT DEFIB RADIOL ZOLL (MISCELLANEOUS) ×4 IMPLANT
PENCIL BUTTON HOLSTER BLD 10FT (ELECTRODE) ×4 IMPLANT
PUNCH AORTIC ROTATE 4.0MM (MISCELLANEOUS) IMPLANT
PUNCH AORTIC ROTATE 4.5MM 8IN (MISCELLANEOUS) ×4 IMPLANT
PUNCH AORTIC ROTATE 5MM 8IN (MISCELLANEOUS) IMPLANT
SEALANT SURG COSEAL 4ML (VASCULAR PRODUCTS) ×1 IMPLANT
SET CARDIOPLEGIA MPS 5001102 (MISCELLANEOUS) ×1 IMPLANT
SPONGE GAUZE 4X4 12PLY (GAUZE/BANDAGES/DRESSINGS) ×8 IMPLANT
SPONGE GAUZE 4X4 12PLY STER LF (GAUZE/BANDAGES/DRESSINGS) ×4 IMPLANT
SPONGE INTESTINAL PEANUT (DISPOSABLE) IMPLANT
SPONGE LAP 18X18 X RAY DECT (DISPOSABLE) ×2 IMPLANT
SPONGE LAP 4X18 X RAY DECT (DISPOSABLE) ×4 IMPLANT
SUT BONE WAX W31G (SUTURE) ×4 IMPLANT
SUT MNCRL AB 4-0 PS2 18 (SUTURE) ×2 IMPLANT
SUT MON AB 4-0 PC3 18 (SUTURE) ×1 IMPLANT
SUT PROLENE 3 0 SH DA (SUTURE) IMPLANT
SUT PROLENE 3 0 SH1 36 (SUTURE) ×4 IMPLANT
SUT PROLENE 4 0 RB 1 (SUTURE)
SUT PROLENE 4 0 SH DA (SUTURE) IMPLANT
SUT PROLENE 4-0 RB1 .5 CRCL 36 (SUTURE) IMPLANT
SUT PROLENE 5 0 C 1 36 (SUTURE) ×3 IMPLANT
SUT PROLENE 6 0 C 1 24 (SUTURE) ×1 IMPLANT
SUT PROLENE 6 0 C 1 30 (SUTURE) ×2 IMPLANT
SUT PROLENE 7 0 BV 1 (SUTURE) IMPLANT
SUT PROLENE 7 0 BV1 MDA (SUTURE) ×5 IMPLANT
SUT PROLENE 8 0 BV175 6 (SUTURE) ×2 IMPLANT
SUT SILK  1 MH (SUTURE)
SUT SILK 1 MH (SUTURE) IMPLANT
SUT STEEL STERNAL CCS#1 18IN (SUTURE) IMPLANT
SUT STEEL SZ 6 DBL 3X14 BALL (SUTURE) ×3 IMPLANT
SUT VIC AB 1 CTX 36 (SUTURE) ×8
SUT VIC AB 1 CTX36XBRD ANBCTR (SUTURE) ×6 IMPLANT
SUT VIC AB 2-0 CT1 27 (SUTURE) ×12
SUT VIC AB 2-0 CT1 TAPERPNT 27 (SUTURE) IMPLANT
SUT VIC AB 2-0 CTX 27 (SUTURE) IMPLANT
SUT VIC AB 3-0 SH 27 (SUTURE)
SUT VIC AB 3-0 SH 27X BRD (SUTURE) IMPLANT
SUT VIC AB 3-0 X1 27 (SUTURE) IMPLANT
SUT VICRYL 4-0 PS2 18IN ABS (SUTURE) IMPLANT
SUTURE E-PAK OPEN HEART (SUTURE) ×4 IMPLANT
SYSTEM SAHARA CHEST DRAIN ATS (WOUND CARE) ×4 IMPLANT
TAPE CLOTH SURG 4X10 WHT LF (GAUZE/BANDAGES/DRESSINGS) ×4 IMPLANT
TOWEL OR 17X24 6PK STRL BLUE (TOWEL DISPOSABLE) ×5 IMPLANT
TOWEL OR 17X26 10 PK STRL BLUE (TOWEL DISPOSABLE) ×5 IMPLANT
TRAY FOLEY IC TEMP SENS 14FR (CATHETERS) ×4 IMPLANT
TUBING INSUFFLATION 10FT LAP (TUBING) ×4 IMPLANT
UNDERPAD 30X30 INCONTINENT (UNDERPADS AND DIAPERS) ×4 IMPLANT
WATER STERILE IRR 1000ML POUR (IV SOLUTION) ×8 IMPLANT
YANKAUER SUCT BULB TIP NO VENT (SUCTIONS) ×1 IMPLANT

## 2014-02-24 NOTE — Progress Notes (Signed)
  Echocardiogram Echocardiogram Transesophageal has been performed.  Philipp Deputy 02/24/2014, 2:56 PM

## 2014-02-24 NOTE — Anesthesia Preprocedure Evaluation (Addendum)
Anesthesia Evaluation  Patient identified by MRN, date of birth, ID band Patient awake    Reviewed: Allergy & Precautions, H&P , NPO status , Patient's Chart, lab work & pertinent test results  Airway Mallampati: III TM Distance: <3 FB Neck ROM: Full    Dental  (+) Dental Advisory Given, Teeth Intact   Pulmonary          Cardiovascular hypertension, Pt. on medications and Pt. on home beta blockers + angina + CAD (100% distal LM and 3 vessel disease), + Past MI and + Peripheral Vascular Disease  02/17/14 echo Left ventricle: The cavity size was normal. Wall thickness   was normal. Systolic function was moderately reduced. The estimated ejection fraction was in the range of 35% to   40%. There is akinesis of the mid-distalanteroseptal and   apical myocardium. Features are consistent with a   pseudonormal left ventricular filling pattern, with   concomitant abnormal relaxation and increased filling   pressure (grade 2 diastolic dysfunction). Doppler   parameters are consistent with high ventricular filling   pressure. - Mitral valve: Calcified annulus. - Left atrium: The atrium was moderately dilated.    Neuro/Psych PSYCHIATRIC DISORDERS    GI/Hepatic negative GI ROS, Neg liver ROS,   Endo/Other  diabetes (glu 150), Oral Hypoglycemic AgentsMorbid obesity  Renal/GU negative Renal ROS   Prostate cancer    Musculoskeletal   Abdominal   Peds  Hematology  (+) Blood dyscrasia (Hb 11.7, plt 124k), ,   Anesthesia Other Findings   Reproductive/Obstetrics                      Anesthesia Physical Anesthesia Plan  ASA: IV  Anesthesia Plan: General   Post-op Pain Management:    Induction: Intravenous  Airway Management Planned: Oral ETT  Additional Equipment: Arterial line, PA Cath, CVP and TEE  Intra-op Plan:   Post-operative Plan: Post-operative intubation/ventilation  Informed Consent: I have  reviewed the patients History and Physical, chart, labs and discussed the procedure including the risks, benefits and alternatives for the proposed anesthesia with the patient or authorized representative who has indicated his/her understanding and acceptance.   Dental advisory given  Plan Discussed with: Anesthesiologist and Surgeon  Anesthesia Plan Comments:        Anesthesia Quick Evaluation

## 2014-02-24 NOTE — Preoperative (Signed)
Beta Blockers   Reason not to administer Beta Blockers:Not Applicable, pt took [id number] @ 1744

## 2014-02-24 NOTE — Op Note (Signed)
CARDIOVASCULAR SURGERY OPERATIVE NOTE  02/24/2014  Surgeon:  Gaye Pollack, MD  First Assistant: Ellwood Handler,  PA-C   Preoperative Diagnosis:  Severe multi-vessel coronary artery disease, Paroxysmal atrial fibrillation   Postoperative Diagnosis:  Same   Procedure:  1. Median Sternotomy 2. Extracorporeal circulation 3.   Coronary artery bypass grafting x 3   Left internal mammary graft to the LAD  SVG to OM  SVG to PDA  4.   Endoscopic vein harvest from the left leg 5.   Clipping of left atrial appendage  Anesthesia:  General Endotracheal   Clinical History/Surgical Indication:  The patient is a 73 year old gentleman with a history of Type II DM, obesity, HTN, and prostate cancer who reports a 1 month history of progressive dyspnea with exertion, fatigue, and ankle edema. He says he was fine prior to that. He denies any history of chest pain or pressure. He has been followed by Dr. Mercy Moore of Nephrology for an elevated BUN with normal creatinine and at a recent visit was noted to have an irregular pulse. An ECG showed rate-controlled A-fib. He was referred to cardiology and an echo showed moderate LV systolic dysfunction with an EF of 35%. A stress test was abnormal with apical and inferolateral scar and possible ischemia with an EF of 32%. Cath today shows 100% distal LM occlusion, 100% LAD occlusion with right to left collaterals, 100% LCX occlusion with right to left collaterals and a large dominant RCA with 50% + stenosis distally. He has severe left main and 3-vessel coronary disease with moderate LV dysfunction and class III congestive heart failure symptoms that all started about a month ago. I think the best treatment is CABG to try to improve his LV function and prevent further ischemia and infarction. I think the RCA stenosis is very significant, a little hazy and probably  greater than 50%. He has new onset A-fib but did not know he was in it and has been rate controlled in it. He has been alternating between sinus and A-fib since this came to light recently. I would not recommend doing a MAZE in this morbidly obese 73 year old with his degree of coronary disease and LV dysfunction. The risk of opening both sides of his heart and prolonging the surgery significantly due to his morbid obesity and poor exposure exceeds the potential benefit. I would plan to clip the left atrial appendage to decrease his stroke risk. I discussed the operative procedure with the patient and family including alternatives, benefits and risks; including but not limited to bleeding, blood transfusion, infection, stroke, myocardial infarction, graft failure, heart block requiring a permanent pacemaker, organ dysfunction, and death. Jeannie Done understands and agrees to proceed.    Preparation:  The patient was seen in the preoperative holding area and the correct patient, correct operation were confirmed with the patient after reviewing the medical record and catheterization. The consent was signed by me. Preoperative antibiotics were given. A pulmonary arterial line and radial arterial line were placed by the anesthesia team. The patient was taken back to the operating room and positioned supine on the operating room table. After being placed under general endotracheal anesthesia by the anesthesia team a foley catheter was placed. The neck, chest, abdomen, and both legs were prepped with betadine soap and solution and draped in the usual sterile manner. A surgical time-out was taken and the correct patient and operative procedure were confirmed with the nursing and anesthesia staff.   Cardiopulmonary  Bypass:  A median sternotomy was performed. The pericardium was opened in the midline. Right ventricular function appeared normal. The ascending aorta was of normal size and had no palpable plaque.  There were no contraindications to aortic cannulation or cross-clamping. The patient was fully systemically heparinized and the ACT was maintained > 400 sec. The proximal aortic arch was cannulated with a 46 F aortic cannula for arterial inflow. Venous cannulation was performed via the right atrial appendage using a two-staged venous cannula. An antegrade cardioplegia/vent cannula was inserted into the mid-ascending aorta. Aortic occlusion was performed with a single cross-clamp. Systemic cooling to 32 degrees Centigrade and topical cooling of the heart with iced saline were used. Hyperkalemic antegrade cold blood cardioplegia was used to induce diastolic arrest and was then given at about 20 minute intervals throughout the period of arrest to maintain myocardial temperature at or below 10 degrees centigrade. A temperature probe was inserted into the interventricular septum and an insulating pad was placed in the pericardium.   Left internal mammary harvest:  The left side of the sternum was retracted using the Rultract retractor. The left internal mammary artery was harvested as a pedicle graft. All side branches were clipped. It was a medium-sized vessel of good quality with excellent blood flow. It was ligated distally and divided. It was sprayed with topical papaverine solution to prevent vasospasm.   Endoscopic vein harvest:  We initially examined the right greater saphenous vein medial to the right knee and it was small and not felt to be suitable. The left greater saphenous vein was harvested endoscopically through a 2 cm incision medial to the left knee. It was harvested from the upper thigh to below the knee. It was a medium-sized vein of good quality. The side branches were all ligated with 4-0 silk ties.    Coronary arteries:  The coronary arteries were examined.   LAD:  Moderate diffuse disease. All diagonals were small, non-graftable vessels  LCX:  There were a couple tiny OM  branches and then a moderate-sized distal marginal branch that was graftable proximally but then divided quickly into two small sub-branches.  RCA:  Diffusely diseased. The PDA was a large vessel with minimal distal disease.   Grafts:  1. LIMA to the LAD: 1.75 mm. It was sewn end to side using 8-0 prolene continuous suture. 2. SVG to OM:  1.6 mm. It was sewn end to side using 7-0 prolene continuous suture. 3. SVG to PDA:  2.0 mm. It was sewn end to side using 7-0 prolene continuous suture.   The proximal vein graft anastomoses were performed to the mid-ascending aorta using continuous 6-0 prolene suture. Graft markers were placed around the proximal anastomoses.   Clipping of the left atrial appendage:  The base of the LAA was measured at 40 mm. A 40 mm Atricure Atriclip was placed at the base of the appendage without difficulty.   Completion:  The patient was rewarmed to 37 degrees Centigrade. The clamp was removed from the LIMA pedicle and there was rapid warming of the septum and return of ventricular fibrillation. The crossclamp was removed with a time of 81 minutes. There was spontaneous return of sinus rhythm. The distal and proximal anastomoses were checked for hemostasis. The position of the grafts was satisfactory. Two temporary epicardial pacing wires were placed on the right atrium and two on the right ventricle. The patient was weaned from CPB without difficulty on no inotropes. CPB time was 104 minutes. Cardiac output was  7 LPM. Heparin was fully reversed with protamine and the aortic and venous cannulas removed. Hemostasis was achieved. Mediastinal and left pleural drainage tubes were placed. The sternum was closed with double #6 stainless steel wires. The fascia was closed with continuous # 1 vicryl suture. The subcutaneous tissue was closed with 2-0 vicryl continuous suture. The skin was closed with 3-0 vicryl subcuticular suture. All sponge, needle, and instrument counts were  reported correct at the end of the case. Dry sterile dressings were placed over the incisions and around the chest tubes which were connected to pleurevac suction. The patient was then transported to the surgical intensive care unit in critical but stable condition.

## 2014-02-24 NOTE — OR Nursing (Signed)
Nursing notes in the intraoperative phase made under Ezra Sites, RN were done by Jene Every, RN

## 2014-02-24 NOTE — Plan of Care (Signed)
Problem: Phase I - Pre-Op Goal: Point person for discharge identified Outcome: Completed/Met Date Met:  02/24/14 wife

## 2014-02-24 NOTE — Anesthesia Procedure Notes (Addendum)
Procedure Name: Intubation Date/Time: 02/24/2014 7:54 AM Performed by: Carola Frost Pre-anesthesia Checklist: Patient identified, Timeout performed, Emergency Drugs available, Suction available and Patient being monitored Patient Re-evaluated:Patient Re-evaluated prior to inductionOxygen Delivery Method: Circle system utilized Preoxygenation: Pre-oxygenation with 100% oxygen Intubation Type: IV induction and Cricoid Pressure applied Ventilation: Mask ventilation without difficulty Laryngoscope Size: Mac and 4 Grade View: Grade IV Tube type: Oral Tube size: 8.0 mm Number of attempts: 1 Airway Equipment and Method: Stylet and Video-laryngoscopy Placement Confirmation: positive ETCO2,  ETT inserted through vocal cords under direct vision,  CO2 detector and breath sounds checked- equal and bilateral Secured at: 24 cm Tube secured with: Tape Dental Injury: Teeth and Oropharynx as per pre-operative assessment  Difficulty Due To: Difficult Airway- due to anterior larynx and Difficulty was anticipated Future Recommendations: Recommend- induction with short-acting agent, and alternative techniques readily available Comments: DLx1 with MAC 4 - grade 4 view.  DLx1with glidescope - grade 1 view.    Procedures: Right IJ Gordy Councilman Catheter Insertion:[id number]: The patient was identified and consent obtained.  TO was performed, and full barrier precautions were used.  The skin was anesthetized with lidocaine-4cc plain with 25g needle.  Once the vein was located with the 22 ga. needle using ultrasound guidance , the wire was inserted into the vein.  The wire location was confirmed with ultrasound.  The tissue was dilated and the 8.5 [place] cordis catheter was carefully inserted. Afterwards Gordy Councilman catheter was inserted. PA catheter at 48cm.  The patient tolerated the procedure well.   CE

## 2014-02-24 NOTE — Brief Op Note (Signed)
02/21/2014 - 02/24/2014  11:31 AM  PATIENT:  Seth [name]  73 y.o. male  PRE-OPERATIVE DIAGNOSIS:  CAD  POST-OPERATIVE DIAGNOSIS:  Coronary artery disease, atrial fibrillation  PROCEDURE:  Procedure(s):  CORONARY ARTERY BYPASS GRAFTING x 3 -LIMA to LAD -SVG to OM -SVG to PDA  CLIPPING OF ATRIAL APPENDAGE (Left)  ENDOSCOPIC SAPHENOUS VEIN HARVEST LEFT THIGH TO BELOW KNEE -Explored Right Leg, conduit felt to be small INTRAOPERATIVE TRANSESOPHAGEAL ECHOCARDIOGRAM (N/A)  SURGEON:  Surgeon(s) and Role:    * Gaye Pollack, MD - Primary  PHYSICIAN ASSISTANT: Ellwood Handler PA-C  ANESTHESIA:   general  EBL:  Total I/O In: -  Out: 475 [Urine:475]  BLOOD ADMINISTERED: CELLSAVER and 1 PLTS  DRAINS: Left Pleural chest tube, Mediastinal chest tubes   LOCAL MEDICATIONS USED:  NONE  SPECIMEN:  No Specimen  DISPOSITION OF SPECIMEN:  N/A  COUNTS:  YES  TOURNIQUET:  * No tourniquets in log *  DICTATION: .Dragon Dictation  PLAN OF CARE: Admit to inpatient   PATIENT DISPOSITION:  ICU - intubated and hemodynamically stable.   Delay start of Pharmacological VTE agent (>24hrs) due to surgical blood loss or risk of bleeding: yes

## 2014-02-24 NOTE — Progress Notes (Signed)
Patient ID: ARACELI ORVIS, male   DOB: [birth year], 73 y.o.   MRN: [id number] EVENING ROUNDS NOTE :     [address]       [place] [address]             [phone]                 Day of Surgery Procedure(s) (LRB): Coronary artery bypass graft times three using left internal mammary artery and left leg greater saphenous vein harvested endoscopically. (N/A) INTRAOPERATIVE TRANSESOPHAGEAL ECHOCARDIOGRAM (N/A) CLIPPING OF ATRIAL APPENDAGE (Left)  Total Length of Stay:  LOS: 3 days  BP 102/73  Pulse 90  Temp(Src) 98.4 F (36.9 C) (Oral)  Resp 13  Ht 5\' 10"  (1.778 m)  Wt 255 lb 11.7 oz (116 kg)  BMI 36.69 kg/m2  SpO2 100%  .Intake/Output     03/02 0701 - 03/03 0700   P.O.    I.V. (mL/kg) [id number] (23)   Blood 1217   IV Piggyback 350   Total Intake(mL/kg) 4234.2 (36.5)   Urine (mL/kg/hr) 1920 (1.3)   Chest Tube 160 (0.1)   Total Output 2080   Net +2154.2         . sodium chloride 20 mL/hr (02/24/14 1400)  . sodium chloride 20 mL/hr (02/24/14 1409)  . [START ON 02/25/2014] sodium chloride    . dexmedetomidine Stopped (02/24/14 1626)  . insulin (NOVOLIN-R) infusion 1.8 Units/hr (02/24/14 1905)  . lactated ringers 20 mL/hr (02/24/14 1400)  . nitroGLYCERIN Stopped (02/24/14 1330)  . phenylephrine (NEO-SYNEPHRINE) Adult infusion 50 mcg/min (02/24/14 1905)     Lab Results  Component Value Date   WBC 16.6* 02/24/2014   HGB 11.6* 02/24/2014   HCT 34.0* 02/24/2014   PLT 130* 02/24/2014   GLUCOSE 158* 02/24/2014   TRIG 81 12/06/2013   HDL 40 12/06/2013   LDLCALC 48 12/06/2013   ALT 16 02/23/2014   AST 12 02/23/2014   NA 138 02/24/2014   K 3.9 02/24/2014   CL 99 02/24/2014   CREATININE 0.84 02/24/2014   BUN 20 02/24/2014   CO2 27 02/24/2014   TSH 1.79 02/12/2014   INR 1.25 02/24/2014   HGBA1C 7.8* 12/10/2013   Surgery today Ci 1.3 to 1.7 now Still asleep Not bleeding  Good uop   Grace Isaac MD  Beeper [phone] Office [phone] 02/24/2014 7:29 PM

## 2014-02-24 NOTE — Transfer of Care (Signed)
Immediate Anesthesia Transfer of Care Note  Patient: Seth [name]  Procedure(s) Performed: Procedure(s): Coronary artery bypass graft times three using left internal mammary artery and left leg greater saphenous vein harvested endoscopically. (N/A) INTRAOPERATIVE TRANSESOPHAGEAL ECHOCARDIOGRAM (N/A) CLIPPING OF ATRIAL APPENDAGE (Left)  Patient Location: SICU  Anesthesia Type:General  Level of Consciousness: Patient remains intubated per anesthesia plan  Airway & Oxygen Therapy: Patient remains intubated per anesthesia plan and Patient placed on Ventilator (see vital sign flow sheet for setting)  Post-op Assessment: report to ICU RN at bedside  Post vital signs: Reviewed and stable  Complications: No apparent anesthesia complications

## 2014-02-25 ENCOUNTER — Inpatient Hospital Stay (HOSPITAL_COMMUNITY): Payer: Medicare Other

## 2014-02-25 DIAGNOSIS — R609 Edema, unspecified: Secondary | ICD-10-CM

## 2014-02-25 DIAGNOSIS — R079 Chest pain, unspecified: Secondary | ICD-10-CM

## 2014-02-25 LAB — POCT I-STAT 3, ART BLOOD GAS (G3+)
Acid-base deficit: 2 mmol/L (ref 0.0–2.0)
Acid-base deficit: 3 mmol/L — ABNORMAL HIGH (ref 0.0–2.0)
Bicarbonate: 22.5 mEq/L (ref 20.0–24.0)
Bicarbonate: 23.2 mEq/L (ref 20.0–24.0)
O2 Saturation: 96 %
O2 Saturation: 96 %
Patient temperature: 37
Patient temperature: 37
TCO2: 24 mmol/L (ref 0–100)
TCO2: 25 mmol/L (ref 0–100)
pCO2 arterial: 36.4 mmHg (ref 35.0–45.0)
pCO2 arterial: 43 mmHg (ref 35.0–45.0)
pH, Arterial: 7.398 (ref 7.350–7.450)
pH, Arterial: 7.34 — ABNORMAL LOW (ref 7.350–7.450)
pO2, Arterial: 81 mmHg (ref 80.0–100.0)
pO2, Arterial: 90 mmHg (ref 80.0–100.0)

## 2014-02-25 LAB — POCT I-STAT, CHEM 8
BUN: 18 mg/dL (ref 6–23)
Calcium, Ion: 1.2 mmol/L (ref 1.13–1.30)
Chloride: 102 mEq/L (ref 96–112)
Creatinine, Ser: 1 mg/dL (ref 0.50–1.35)
Glucose, Bld: 165 mg/dL — ABNORMAL HIGH (ref 70–99)
HCT: 33 % — ABNORMAL LOW (ref 39.0–52.0)
Hemoglobin: 11.2 g/dL — ABNORMAL LOW (ref 13.0–17.0)
Potassium: 4.3 mEq/L (ref 3.7–5.3)
Sodium: 139 mEq/L (ref 137–147)
TCO2: 24 mmol/L (ref 0–100)

## 2014-02-25 LAB — PREPARE PLATELET PHERESIS: Unit division: 0

## 2014-02-25 LAB — GLUCOSE, CAPILLARY
Glucose-Capillary: 112 mg/dL — ABNORMAL HIGH (ref 70–99)
Glucose-Capillary: 133 mg/dL — ABNORMAL HIGH (ref 70–99)
Glucose-Capillary: 104 mg/dL — ABNORMAL HIGH (ref 70–99)
Glucose-Capillary: 106 mg/dL — ABNORMAL HIGH (ref 70–99)
Glucose-Capillary: 108 mg/dL — ABNORMAL HIGH (ref 70–99)
Glucose-Capillary: 113 mg/dL — ABNORMAL HIGH (ref 70–99)
Glucose-Capillary: 113 mg/dL — ABNORMAL HIGH (ref 70–99)
Glucose-Capillary: 115 mg/dL — ABNORMAL HIGH (ref 70–99)
Glucose-Capillary: 117 mg/dL — ABNORMAL HIGH (ref 70–99)
Glucose-Capillary: 120 mg/dL — ABNORMAL HIGH (ref 70–99)
Glucose-Capillary: 123 mg/dL — ABNORMAL HIGH (ref 70–99)
Glucose-Capillary: 123 mg/dL — ABNORMAL HIGH (ref 70–99)
Glucose-Capillary: 124 mg/dL — ABNORMAL HIGH (ref 70–99)
Glucose-Capillary: 127 mg/dL — ABNORMAL HIGH (ref 70–99)
Glucose-Capillary: 132 mg/dL — ABNORMAL HIGH (ref 70–99)
Glucose-Capillary: 143 mg/dL — ABNORMAL HIGH (ref 70–99)
Glucose-Capillary: 96 mg/dL (ref 70–99)
Glucose-Capillary: 108 mg/dL — ABNORMAL HIGH (ref 70–99)
Glucose-Capillary: 110 mg/dL — ABNORMAL HIGH (ref 70–99)
Glucose-Capillary: 114 mg/dL — ABNORMAL HIGH (ref 70–99)
Glucose-Capillary: 115 mg/dL — ABNORMAL HIGH (ref 70–99)
Glucose-Capillary: 116 mg/dL — ABNORMAL HIGH (ref 70–99)
Glucose-Capillary: 122 mg/dL — ABNORMAL HIGH (ref 70–99)
Glucose-Capillary: 135 mg/dL — ABNORMAL HIGH (ref 70–99)
Glucose-Capillary: 148 mg/dL — ABNORMAL HIGH (ref 70–99)
Glucose-Capillary: 192 mg/dL — ABNORMAL HIGH (ref 70–99)

## 2014-02-25 LAB — CREATININE, SERUM
Creatinine, Ser: 1.03 mg/dL (ref 0.50–1.35)
GFR calc Af Amer: 82 mL/min — ABNORMAL LOW (ref >90)
GFR calc non Af Amer: 71 mL/min — ABNORMAL LOW (ref >90)

## 2014-02-25 LAB — BASIC METABOLIC PANEL
BUN: 17 mg/dL (ref 6–23)
CO2: 22 mEq/L (ref 19–32)
Calcium: 8.1 mg/dL — ABNORMAL LOW (ref 8.4–10.5)
Chloride: 106 mEq/L (ref 96–112)
Creatinine, Ser: 0.81 mg/dL (ref 0.50–1.35)
GFR calc Af Amer: >90 mL/min (ref >90)
GFR calc non Af Amer: 87 mL/min — ABNORMAL LOW (ref >90)
Glucose, Bld: 132 mg/dL — ABNORMAL HIGH (ref 70–99)
Potassium: 4.1 mEq/L (ref 3.7–5.3)
Sodium: 140 mEq/L (ref 137–147)

## 2014-02-25 LAB — MAGNESIUM
Magnesium: 2.7 mg/dL — ABNORMAL HIGH (ref 1.5–2.5)
Magnesium: 2.6 mg/dL — ABNORMAL HIGH (ref 1.5–2.5)

## 2014-02-25 LAB — CBC
HCT: 34.9 % — ABNORMAL LOW (ref 39.0–52.0)
HCT: 33.2 % — ABNORMAL LOW (ref 39.0–52.0)
Hemoglobin: 11.3 g/dL — ABNORMAL LOW (ref 13.0–17.0)
Hemoglobin: 10.8 g/dL — ABNORMAL LOW (ref 13.0–17.0)
MCH: 27.6 pg (ref 26.0–34.0)
MCH: 28.2 pg (ref 26.0–34.0)
MCHC: 32.4 g/dL (ref 30.0–36.0)
MCHC: 32.5 g/dL (ref 30.0–36.0)
MCV: 85.3 fL (ref 78.0–100.0)
MCV: 86.7 fL (ref 78.0–100.0)
Platelets: 116 10*3/uL — ABNORMAL LOW (ref 150–400)
Platelets: 103 10*3/uL — ABNORMAL LOW (ref 150–400)
RBC: 4.09 MIL/uL — ABNORMAL LOW (ref 4.22–5.81)
RBC: 3.83 MIL/uL — ABNORMAL LOW (ref 4.22–5.81)
RDW: 14.2 % (ref 11.5–15.5)
RDW: 14.6 % (ref 11.5–15.5)
WBC: 14.2 10*3/uL — ABNORMAL HIGH (ref 4.0–10.5)
WBC: 15.2 10*3/uL — ABNORMAL HIGH (ref 4.0–10.5)

## 2014-02-25 LAB — HEMOGLOBIN A1C
Hgb A1c MFr Bld: 7.9 % — ABNORMAL HIGH (ref <5.7)
Mean Plasma Glucose: 180 mg/dL — ABNORMAL HIGH (ref <117)

## 2014-02-25 LAB — HEMOGLOBIN AND HEMATOCRIT, BLOOD
HCT: 27.1 % — ABNORMAL LOW (ref 39.0–52.0)
Hemoglobin: 9.4 g/dL — ABNORMAL LOW (ref 13.0–17.0)

## 2014-02-25 MED ORDER — INSULIN DETEMIR 100 UNIT/ML ~~LOC~~ SOLN
20.0000 [IU] | Freq: Once | SUBCUTANEOUS | Status: AC
Start: 2014-02-25 — End: 2014-02-25
  Administered 2014-02-25: 20 [IU] via SUBCUTANEOUS
  Filled 2014-02-25: qty 0.2

## 2014-02-25 MED ORDER — METOPROLOL TARTRATE 25 MG/10 ML ORAL SUSPENSION
25.0000 mg | Freq: Two times a day (BID) | ORAL | Status: DC
  Filled 2014-02-25 (×4): qty 10

## 2014-02-25 MED ORDER — POTASSIUM CHLORIDE CRYS ER 20 MEQ PO TBCR
40.0000 meq | EXTENDED_RELEASE_TABLET | Freq: Once | ORAL | Status: AC
  Administered 2014-02-25: 40 meq via ORAL
  Filled 2014-02-25: qty 2

## 2014-02-25 MED ORDER — INSULIN ASPART 100 UNIT/ML ~~LOC~~ SOLN
0.0000 [IU] | SUBCUTANEOUS | Status: DC
  Administered 2014-02-25: 2 [IU] via SUBCUTANEOUS
  Administered 2014-02-25: 8 [IU] via SUBCUTANEOUS
  Administered 2014-02-25: 4 [IU] via SUBCUTANEOUS
  Administered 2014-02-26 (×2): 2 [IU] via SUBCUTANEOUS

## 2014-02-25 MED ORDER — FUROSEMIDE 10 MG/ML IJ SOLN
40.0000 mg | Freq: Once | INTRAMUSCULAR | Status: AC
  Administered 2014-02-25: 40 mg via INTRAVENOUS

## 2014-02-25 MED ORDER — METOPROLOL TARTRATE 25 MG PO TABS
25.0000 mg | ORAL_TABLET | Freq: Two times a day (BID) | ORAL | Status: DC
  Administered 2014-02-25 – 2014-02-26 (×3): 25 mg via ORAL
  Filled 2014-02-25 (×4): qty 1

## 2014-02-25 MED ORDER — ENOXAPARIN SODIUM 40 MG/0.4ML ~~LOC~~ SOLN
40.0000 mg | Freq: Every day | SUBCUTANEOUS | Status: DC
  Administered 2014-02-25: 40 mg via SUBCUTANEOUS
  Filled 2014-02-25 (×2): qty 0.4

## 2014-02-25 MED ORDER — INSULIN DETEMIR 100 UNIT/ML ~~LOC~~ SOLN: 20.0000 [IU] | Freq: Every day | SUBCUTANEOUS | Status: DC

## 2014-02-25 MED ORDER — INSULIN DETEMIR 100 UNIT/ML ~~LOC~~ SOLN
15.0000 [IU] | Freq: Every day | SUBCUTANEOUS | Status: DC
  Administered 2014-02-25: 15 [IU] via SUBCUTANEOUS
  Filled 2014-02-25 (×2): qty 0.15

## 2014-02-25 MED FILL — Magnesium Sulfate Inj 50%: INTRAMUSCULAR | Qty: 10 | Status: AC

## 2014-02-25 MED FILL — Potassium Chloride Inj 2 mEq/ML: INTRAVENOUS | Qty: 40 | Status: AC

## 2014-02-25 MED FILL — Heparin Sodium (Porcine) Inj 1000 Unit/ML: INTRAMUSCULAR | Qty: 30 | Status: AC

## 2014-02-25 NOTE — Anesthesia Postprocedure Evaluation (Signed)
  Anesthesia Post-op Note  Patient: Seth [name]  Procedure(s) Performed: Procedure(s): Coronary artery bypass graft times three using left internal mammary artery and left leg greater saphenous vein harvested endoscopically. (N/A) INTRAOPERATIVE TRANSESOPHAGEAL ECHOCARDIOGRAM (N/A) CLIPPING OF ATRIAL APPENDAGE (Left)  Patient Location: SICU  Anesthesia Type:General  Level of Consciousness: awake, alert  and oriented  Airway and Oxygen Therapy: Patient Spontanous Breathing and Patient connected to nasal cannula oxygen  Post-op Pain: mild  Post-op Assessment: Post-op Vital signs reviewed, Patient's Cardiovascular Status Stable, Respiratory Function Stable, Patent Airway and No signs of Nausea or vomiting  Post-op Vital Signs: Reviewed and stable  Complications: No apparent anesthesia complications

## 2014-02-25 NOTE — Progress Notes (Signed)
Subjective:  Day 1 s/p CABG x3 and clipping of left atrial appendage  Objective:   Vital Signs in the last 24 hours: Temp:  [97.9 F (36.6 C)-99.7 F (37.6 C)] 99.1 F (37.3 C) (03/03 1537) Pulse Rate:  [64-99] 77 (03/03 1600) Resp:  [12-27] 19 (03/03 1600) BP: (90-134)/(57-78) 125/63 mmHg (03/03 1600) SpO2:  [92 %-100 %] 92 % (03/03 1600) Arterial Line BP: (98-157)/(19-66) 157/66 mmHg (03/03 0900) FiO2 (%):  [40 %-50 %] 40 % (03/03 0232) Weight:  [255 lb 11.7 oz (116 kg)-260 lb 9.3 oz (118.2 kg)] 260 lb 9.3 oz (118.2 kg) (03/03 0430)  Intake/Output from previous day: 03/02 0701 - 03/03 0700 In: 5344.9 [I.V.:3477.9; Blood:1217; IV Piggyback:650] Out: 2925 [Urine:2495; Emesis/NG output:150; Chest Tube:280]  Medications: . acetaminophen  1,000 mg Oral 4 times per day   Or  . acetaminophen (TYLENOL) oral liquid 160 mg/5 mL  1,000 mg Per Tube 4 times per day  . aspirin EC  325 mg Oral Daily   Or  . aspirin  324 mg Per Tube Daily  . atorvastatin  10 mg Oral Daily  . bisacodyl  10 mg Oral Daily   Or  . bisacodyl  10 mg Rectal Daily  . cefUROXime (ZINACEF)  IV  1.5 g Intravenous Q12H  . docusate sodium  200 mg Oral Daily  . enoxaparin (LOVENOX) injection  40 mg Subcutaneous QHS  . insulin aspart  0-24 Units Subcutaneous 6 times per day  . [START ON 02/26/2014] insulin detemir  20 Units Subcutaneous Daily  . insulin regular  0-10 Units Intravenous TID WC  . metoprolol tartrate  25 mg Oral BID   Or  . metoprolol tartrate  25 mg Per Tube BID  . [START ON 02/26/2014] pantoprazole  40 mg Oral Daily  . sodium chloride  3 mL Intravenous Q12H    . sodium chloride 20 mL/hr (02/24/14 1400)  . sodium chloride 20 mL/hr (02/24/14 1409)  . sodium chloride    . dexmedetomidine Stopped (02/24/14 1626)  . insulin (NOVOLIN-R) infusion 3.2 mL/hr at 02/25/14 1300  . lactated ringers 20 mL/hr (02/24/14 1400)  . nitroGLYCERIN Stopped (02/24/14 1330)  . phenylephrine (NEO-SYNEPHRINE) Adult  infusion Stopped (02/25/14 0300)    Physical Exam:   General appearance: alert, cooperative and no distress Neck: no adenopathy, no JVD, supple, symmetrical, trachea midline and thyroid not enlarged, symmetric, no tenderness/mass/nodules Lungs: decreased BS at bases Heart: regular rate and rhythm 1/6 sem; no rub Abdomen: soft, non-tender; bowel sounds normal; no masses,  no organomegaly Extremities: trace edema bilaterally Neurologic: Grossly normal   Rate: 78  Rhythm: normal sinus rhythm  Lab Results:    Recent Labs  02/24/14 0430  02/24/14 1341 02/24/14 2245 02/25/14 0405  NA 138  < > 138  --  140  K 3.6*  < > 3.9  --  4.1  CL 99  --   --   --  106  CO2 27  --   --   --  22  GLUCOSE 156*  < > 158*  --  132*  BUN 20  --   --   --  17  CREATININE 0.84  --   --  0.81 0.81  < > = values in this interval not displayed. No results found for this basename: TROPONINI, CK, MB,  in the last 72 hours Hepatic Function Panel  Recent Labs  02/23/14 1646  PROT 7.1  ALBUMIN 3.5  AST 12  ALT 16  ALKPHOS 47  BILITOT 0.4    Recent Labs  02/24/14 1330  INR 1.25   BNP (last 3 results) No results found for this basename: PROBNP,  in the last 8760 hours  Lipid Panel     Component Value Date/Time   TRIG 81 12/06/2013 0828   HDL 40 12/06/2013 0828   CHOLHDL 2.6 12/06/2013 0828   LDLCALC 48 12/06/2013 0828      Imaging:  Dg Chest Portable 1 View In Am  02/25/2014   CLINICAL DATA:  Status post CABG  EXAM: PORTABLE CHEST - 1 VIEW  COMPARISON:  DG CHEST 1V PORT dated 02/24/2014  FINDINGS: The lungs are adequately inflated since extubation of the trachea. The pulmonary interstitial markings however are increased bilaterally. The hemidiaphragms are obscured bilaterally. The upper left chest tube appears unchanged in appearance. No definite pneumothorax is visible. The lower chest tube is likely stable but is very difficult to demonstrate. The right internal jugular Cordis sheath  remains in place. A Swan-Ganz type catheter is in place but its tip cannot be clearly discerned. The cardiopericardial silhouette is mildly enlarged. The pulmonary vascularity is mildly prominent centrally. The left atrial appendage clip is demonstrated.  IMPRESSION: 1. Since extubation the pulmonary interstitial markings have increased in conspicuity. This likely reflects pulmonary interstitial edema. No pneumothorax or significant pleural effusion is demonstrated. Bibasilar atelectasis may be responsible for increased density at the lung bases. 2. The tip of the Swan-Ganz catheter is difficult to demonstrated. 3. The cardiopericardial silhouette remains mildly enlarged. There is mild central pulmonary vascular congestion.   Electronically Signed   By: David  Martinique   On: 02/25/2014 08:26   Dg Chest Portable 1 View  02/24/2014   CLINICAL DATA:  Postop CABG.  EXAM: PORTABLE CHEST - 1 VIEW  COMPARISON:  02/23/2014.  FINDINGS: 1330 hr. The carina is not well visualized. The tip of the endotracheal tube appears to be within 2 cm of the expected location of the carina. A nasogastric tube projects below the diaphragm. There is a right IJ Swan-Ganz catheter with its tip in the right ventricular outflow tract. A left chest tube is in place.  There are low lung volumes with mild left basilar atelectasis. No pneumothorax or definite edema is present. The heart size and mediastinal contours are stable status post interval CABG.  IMPRESSION: Mild left basilar atelectasis following CABG. No evidence of pneumothorax.   Electronically Signed   By: Camie Patience M.D.   On: 02/24/2014 14:02      Assessment/Plan:   Active Problems:   Unstable angina   S/P CABG x 3  Day 1 s/p CABG. I/O -205 today; +2419 yesterday and + 3772 since 2/27 with 5 lb weight increase from pre-op.  Stable hemodynamics. Maintaining sinus rhythm. Diuresis.     Troy Sine, MD, [facility] 02/25/2014, 4:21 PM

## 2014-02-25 NOTE — Procedures (Signed)
Extubation Procedure Note  Patient Details:   Name: Seth [name] DOB: 1941/03/20 MRN: [id number]   Airway Documentation:  Airway (Active)  Secured at (cm) 27 cm 02/25/2014  2:10 AM  Measured From Lips 02/25/2014  2:10 AM  Secured Location Right 02/25/2014  2:10 AM  Secured By Pink Tape 02/25/2014  2:10 AM  Cuff Pressure (cm H2O) 25 cm H2O 02/24/2014  7:42 PM  Site Condition Dry 02/24/2014 11:09 PM    Evaluation  O2 sats: stable throughout Complications: No apparent complications Patient did tolerate procedure well. Bilateral Breath Sounds: Clear Suctioning: Airway (no suction at this time.) Yes VC 1.2 NIF -20  Seth [name] 02/25/2014, 3:37 AM

## 2014-02-25 NOTE — Progress Notes (Signed)
On 1700 labs pt glucose 165.  Pt meal tray arrived late.  Anticipated that pt 2000 (result=192) CBG would be elevated. Pt outside 24 hour [place] end time.   Discussed with Dr. Prescott Gum on rounds and Dr. Prescott Gum added evening levemir. Will continue to monitor.

## 2014-02-25 NOTE — Progress Notes (Signed)
1 Day Post-Op Procedure(s) (LRB): Coronary artery bypass graft times three using left internal mammary artery and left leg greater saphenous vein harvested endoscopically. (N/A) INTRAOPERATIVE TRANSESOPHAGEAL ECHOCARDIOGRAM (N/A) CLIPPING OF ATRIAL APPENDAGE (Left) Subjective:  No complaints   Objective: Vital signs in last 24 hours: Temp:  [97.7 F (36.5 C)-99.7 F (37.6 C)] 98.1 F (36.7 C) (03/03 0700) Pulse Rate:  [64-94] 80 (03/03 0700) Cardiac Rhythm:  [-] Normal sinus rhythm (03/03 0600) Resp:  [0-26] 14 (03/03 0700) BP: (90-129)/(57-78) 129/59 mmHg (03/03 0700) SpO2:  [94 %-100 %] 97 % (03/03 0700) Arterial Line BP: (98-138)/(19-75) 137/57 mmHg (03/03 0700) FiO2 (%):  [40 %-50 %] 40 % (03/03 0232) Weight:  [116 kg (255 lb 11.7 oz)-118.2 kg (260 lb 9.3 oz)] 118.2 kg (260 lb 9.3 oz) (03/03 0430)  Hemodynamic parameters for last 24 hours: PAP: (27-40)/(14-28) 33/17 mmHg CO:  [2.9 L/min-5.7 L/min] 5.7 L/min CI:  [1.3 L/min/m2-2.5 L/min/m2] 2.5 L/min/m2  Intake/Output from previous day: 03/02 0701 - 03/03 0700 In: 5344.9 [I.V.:3477.9; Blood:1217; IV Piggyback:650] Out: 2925 [Urine:2495; Emesis/NG output:150; Chest Tube:280] Intake/Output this shift:    General appearance: alert and cooperative Neurologic: intact Heart: regular rate and rhythm, S1, S2 normal, no murmur, click, rub or gallop Lungs: clear to auscultation bilaterally Extremities: edema mild Wound: dressing dry  Lab Results:  Recent Labs  02/24/14 2245 02/25/14 0405  WBC 14.7* 14.2*  HGB 11.8* 11.3*  HCT 34.5* 34.9*  PLT 128* 116*   BMET:  Recent Labs  02/24/14 0430  02/24/14 1341 02/24/14 2245 02/25/14 0405  NA 138  < > 138  --  140  [name] 3.6*  < > 3.9  --  4.1  CL 99  --   --   --  106  CO2 27  --   --   --  22  GLUCOSE 156*  < > 158*  --  132*  BUN 20  --   --   --  17  CREATININE 0.84  --   --  0.81 0.81  CALCIUM 9.1  --   --   --  8.1*  < > = values in this interval not displayed.   PT/INR:  Recent Labs  02/24/14 1330  LABPROT 15.4*  INR 1.25   ABG    Component Value Date/Time   PHART 7.340* 02/25/2014 0409   HCO3 23.2 02/25/2014 0409   TCO2 25 02/25/2014 0409   ACIDBASEDEF 3.0* 02/25/2014 0409   O2SAT 96.0 02/25/2014 0409   CBG (last 3)   Recent Labs  02/25/14 0500 02/25/14 0603 02/25/14 0650  GLUCAP 132* 124* 117*    Assessment/Plan: S/P Procedure(s) (LRB): Coronary artery bypass graft times three using left internal mammary artery and left leg greater saphenous vein harvested endoscopically. (N/A) INTRAOPERATIVE TRANSESOPHAGEAL ECHOCARDIOGRAM (N/A) CLIPPING OF ATRIAL APPENDAGE (Left) Mobilize Diuresis Diabetes control d/c tubes/lines Continue foley due to patient in ICU and urinary output monitoring See progression orders   LOS: 4 days    Seth [name],Seth [name] 02/25/2014

## 2014-02-25 NOTE — Progress Notes (Signed)
CT surgery p.m. Rounds  Postop day one CABG Doing well walking the hallway maintaining sinus rhythm Evening labs satisfactory except for elevated blood sugar Will change levemir dosing to twice a day

## 2014-02-25 NOTE — Progress Notes (Signed)
Inpatient Diabetes Program Recommendations  AACE/ADA: New Consensus Statement on Inpatient Glycemic Control (2013)  Target Ranges:  Prepandial:   less than 140 mg/dL      Peak postprandial:   less than 180 mg/dL (1-2 hours)      Critically ill patients:  140 - 180 mg/dL   Reason for Visit: Results for DAVINE, SANANDRES (MRN [id number]) as of 02/25/2014 10:32  Ref. Range 02/24/2014 04:30  Hemoglobin A1C Latest Range: <5.7 % 7.9 (H)   Diabetes history: Type 2 diabetes Outpatient Diabetes medications: Glucotrol XL 5 mg daily, Metformin 1000 mg bid Current orders for Inpatient glycemic control: Transitioning off insulin drip today to Levemir 20 units daily and TCTS q 4 hours.    May consider adding Novolog meal coverage 4 units tid with meals (to be held if patient eats less than 50%).  Adah Perl, RN, BC-ADM Inpatient Diabetes Coordinator Pager [phone]

## 2014-02-26 ENCOUNTER — Ambulatory Visit: Payer: Medicare Other | Admitting: Internal Medicine

## 2014-02-26 ENCOUNTER — Inpatient Hospital Stay (HOSPITAL_COMMUNITY): Payer: Medicare Other

## 2014-02-26 ENCOUNTER — Encounter (HOSPITAL_COMMUNITY): Payer: Self-pay | Admitting: Surgery

## 2014-02-26 LAB — GLUCOSE, CAPILLARY
Glucose-Capillary: 224 mg/dL — ABNORMAL HIGH (ref 70–99)
Glucose-Capillary: 116 mg/dL — ABNORMAL HIGH (ref 70–99)
Glucose-Capillary: 156 mg/dL — ABNORMAL HIGH (ref 70–99)
Glucose-Capillary: 178 mg/dL — ABNORMAL HIGH (ref 70–99)
Glucose-Capillary: 214 mg/dL — ABNORMAL HIGH (ref 70–99)
Glucose-Capillary: 175 mg/dL — ABNORMAL HIGH (ref 70–99)

## 2014-02-26 LAB — CBC
HCT: 31.3 % — ABNORMAL LOW (ref 39.0–52.0)
Hemoglobin: 10.2 g/dL — ABNORMAL LOW (ref 13.0–17.0)
MCH: 28.2 pg (ref 26.0–34.0)
MCHC: 32.6 g/dL (ref 30.0–36.0)
MCV: 86.5 fL (ref 78.0–100.0)
Platelets: 103 10*3/uL — ABNORMAL LOW (ref 150–400)
RBC: 3.62 MIL/uL — ABNORMAL LOW (ref 4.22–5.81)
RDW: 14.6 % (ref 11.5–15.5)
WBC: 16.3 10*3/uL — ABNORMAL HIGH (ref 4.0–10.5)

## 2014-02-26 LAB — BASIC METABOLIC PANEL
BUN: 22 mg/dL (ref 6–23)
CO2: 24 mEq/L (ref 19–32)
Calcium: 8.5 mg/dL (ref 8.4–10.5)
Chloride: 104 mEq/L (ref 96–112)
Creatinine, Ser: 0.9 mg/dL (ref 0.50–1.35)
GFR calc Af Amer: >90 mL/min (ref >90)
GFR calc non Af Amer: 84 mL/min — ABNORMAL LOW (ref >90)
Glucose, Bld: 125 mg/dL — ABNORMAL HIGH (ref 70–99)
Potassium: 4.4 mEq/L (ref 3.7–5.3)
Sodium: 139 mEq/L (ref 137–147)

## 2014-02-26 MED ORDER — MOVING RIGHT ALONG BOOK
Freq: Once | Status: AC
  Administered 2014-02-26: 13:00:00
  Filled 2014-02-26: qty 1

## 2014-02-26 MED ORDER — DOCUSATE SODIUM 100 MG PO CAPS
200.0000 mg | ORAL_CAPSULE | Freq: Every day | ORAL | Status: DC
Start: 2014-02-26 — End: 2014-03-01
  Administered 2014-02-27 – 2014-03-01 (×3): 200 mg via ORAL
  Filled 2014-02-26 (×3): qty 2

## 2014-02-26 MED ORDER — ONDANSETRON HCL 4 MG PO TABS: 4.0000 mg | ORAL_TABLET | Freq: Four times a day (QID) | ORAL | Status: DC | PRN

## 2014-02-26 MED ORDER — INSULIN DETEMIR 100 UNIT/ML ~~LOC~~ SOLN
15.0000 [IU] | Freq: Every day | SUBCUTANEOUS | Status: DC
  Administered 2014-02-26: 15 [IU] via SUBCUTANEOUS
  Filled 2014-02-26: qty 0.15

## 2014-02-26 MED ORDER — FAMOTIDINE 20 MG PO TABS
20.0000 mg | ORAL_TABLET | Freq: Two times a day (BID) | ORAL | Status: DC
  Administered 2014-02-26 – 2014-03-01 (×6): 20 mg via ORAL
  Filled 2014-02-26 (×7): qty 1

## 2014-02-26 MED ORDER — ACETAMINOPHEN 325 MG PO TABS: 650.0000 mg | ORAL_TABLET | Freq: Four times a day (QID) | ORAL | Status: DC | PRN

## 2014-02-26 MED ORDER — TRAMADOL HCL 50 MG PO TABS: 50.0000 mg | ORAL_TABLET | ORAL | Status: DC | PRN

## 2014-02-26 MED ORDER — ASPIRIN EC 81 MG PO TBEC
81.0000 mg | DELAYED_RELEASE_TABLET | Freq: Every day | ORAL | Status: DC
  Administered 2014-02-27 – 2014-03-01 (×3): 81 mg via ORAL
  Filled 2014-02-26 (×3): qty 1

## 2014-02-26 MED ORDER — BISACODYL 10 MG RE SUPP: 10.0000 mg | Freq: Every day | RECTAL | Status: DC | PRN

## 2014-02-26 MED ORDER — SODIUM CHLORIDE 0.9 % IV SOLN: 250.0000 mL | INTRAVENOUS | Status: DC | PRN

## 2014-02-26 MED ORDER — POTASSIUM CHLORIDE CRYS ER 20 MEQ PO TBCR
20.0000 meq | EXTENDED_RELEASE_TABLET | Freq: Two times a day (BID) | ORAL | Status: AC
  Administered 2014-02-26 – 2014-02-27 (×4): 20 meq via ORAL
  Filled 2014-02-26 (×3): qty 1

## 2014-02-26 MED ORDER — OXYCODONE HCL 5 MG PO TABS: 5.0000 mg | ORAL_TABLET | ORAL | Status: DC | PRN

## 2014-02-26 MED ORDER — METOPROLOL TARTRATE 25 MG PO TABS
25.0000 mg | ORAL_TABLET | Freq: Two times a day (BID) | ORAL | Status: DC
  Administered 2014-02-26 – 2014-02-27 (×2): 25 mg via ORAL
  Filled 2014-02-26 (×3): qty 1

## 2014-02-26 MED ORDER — INSULIN ASPART 100 UNIT/ML ~~LOC~~ SOLN
0.0000 [IU] | Freq: Three times a day (TID) | SUBCUTANEOUS | Status: DC
  Administered 2014-02-26: 2 [IU] via SUBCUTANEOUS
  Administered 2014-02-26: 8 [IU] via SUBCUTANEOUS
  Administered 2014-02-27 (×2): 4 [IU] via SUBCUTANEOUS
  Administered 2014-02-27: 2 [IU] via SUBCUTANEOUS
  Administered 2014-02-27: 4 [IU] via SUBCUTANEOUS
  Administered 2014-02-28 (×2): 2 [IU] via SUBCUTANEOUS
  Administered 2014-02-28: 4 [IU] via SUBCUTANEOUS
  Administered 2014-02-28: 2 [IU] via SUBCUTANEOUS

## 2014-02-26 MED ORDER — INSULIN DETEMIR 100 UNIT/ML ~~LOC~~ SOLN
20.0000 [IU] | Freq: Every day | SUBCUTANEOUS | Status: DC
  Filled 2014-02-26: qty 0.2

## 2014-02-26 MED ORDER — FUROSEMIDE 10 MG/ML IJ SOLN
40.0000 mg | Freq: Once | INTRAMUSCULAR | Status: AC
  Administered 2014-02-26: 40 mg via INTRAVENOUS
  Filled 2014-02-26: qty 4

## 2014-02-26 MED ORDER — ONDANSETRON HCL 4 MG/2ML IJ SOLN: 4.0000 mg | Freq: Four times a day (QID) | INTRAMUSCULAR | Status: DC | PRN

## 2014-02-26 MED ORDER — FUROSEMIDE 40 MG PO TABS
40.0000 mg | ORAL_TABLET | Freq: Every day | ORAL | Status: AC
  Administered 2014-02-27 – 2014-02-28 (×2): 40 mg via ORAL
  Filled 2014-02-26 (×2): qty 1

## 2014-02-26 MED ORDER — SODIUM CHLORIDE 0.9 % IJ SOLN: 3.0000 mL | INTRAMUSCULAR | Status: DC | PRN

## 2014-02-26 MED ORDER — BISACODYL 5 MG PO TBEC
10.0000 mg | DELAYED_RELEASE_TABLET | Freq: Every day | ORAL | Status: DC | PRN
  Filled 2014-02-26: qty 2

## 2014-02-26 MED ORDER — SODIUM CHLORIDE 0.9 % IJ SOLN
3.0000 mL | Freq: Two times a day (BID) | INTRAMUSCULAR | Status: DC
  Administered 2014-02-26 – 2014-02-28 (×5): 3 mL via INTRAVENOUS

## 2014-02-26 MED FILL — Heparin Sodium (Porcine) Inj 1000 Unit/ML: INTRAMUSCULAR | Qty: 20 | Status: AC

## 2014-02-26 MED FILL — Electrolyte-R (PH 7.4) Solution: INTRAVENOUS | Qty: 4000 | Status: AC

## 2014-02-26 MED FILL — Lidocaine HCl IV Inj 20 MG/ML: INTRAVENOUS | Qty: 5 | Status: AC

## 2014-02-26 MED FILL — Sodium Bicarbonate IV Soln 8.4%: INTRAVENOUS | Qty: 50 | Status: AC

## 2014-02-26 MED FILL — Sodium Chloride IV Soln 0.9%: INTRAVENOUS | Qty: 2000 | Status: AC

## 2014-02-26 MED FILL — Mannitol IV Soln 20%: INTRAVENOUS | Qty: 500 | Status: AC

## 2014-02-26 NOTE — Progress Notes (Signed)
Pt t/x to 2W03 in wheelchair on monitor without event and family present. Dina RN present for arrival to unit, telemetry hookup, and bedside report  Lorre Munroe

## 2014-02-26 NOTE — Progress Notes (Signed)
2 Days Post-Op Procedure(s) (LRB): Coronary artery bypass graft times three using left internal mammary artery and left leg greater saphenous vein harvested endoscopically. (N/A) INTRAOPERATIVE TRANSESOPHAGEAL ECHOCARDIOGRAM (N/A) CLIPPING OF ATRIAL APPENDAGE (Left) Subjective:  No complaints  Objective: Vital signs in last 24 hours: Temp:  [98.1 F (36.7 C)-99.1 F (37.3 C)] 98.4 F (36.9 C) (03/04 0757) Pulse Rate:  [75-99] 77 (03/04 0800) Cardiac Rhythm:  [-] Normal sinus rhythm (03/04 0800) Resp:  [15-27] 19 (03/04 0800) BP: (108-142)/(54-78) 122/59 mmHg (03/04 0800) SpO2:  [90 %-98 %] 92 % (03/04 0800) Arterial Line BP: (157)/(66) 157/66 mmHg (03/03 0900) Weight:  [118.3 kg (260 lb 12.9 oz)] 118.3 kg (260 lb 12.9 oz) (03/04 0500)  Hemodynamic parameters for last 24 hours: PAP: (37)/(19) 37/19 mmHg  Intake/Output from previous day: 03/03 0701 - 03/04 0700 In: 1214.7 [P.O.:740; I.V.:424.7; IV Piggyback:50] Out: [id number] [Urine:1690; Chest Tube:60] Intake/Output this shift: Total I/O In: -  Out: 60 [Urine:60]  General appearance: alert and cooperative Neurologic: intact Heart: regular rate and rhythm, S1, S2 normal, no murmur, click, rub or gallop Lungs: clear to auscultation bilaterally Extremities: edema mild in legs Wound: dressings dry  Lab Results:  Recent Labs  02/25/14 1700 02/26/14 0430  WBC 15.2* 16.3*  HGB 10.8* 10.2*  HCT 33.2* 31.3*  PLT 103* 103*   BMET:  Recent Labs  02/25/14 0405 02/25/14 1651 02/25/14 1700 02/26/14 0430  NA 140 139  --  139  [name] 4.1 4.3  --  4.4  CL 106 102  --  104  CO2 22  --   --  24  GLUCOSE 132* 165*  --  125*  BUN 17 18  --  22  CREATININE 0.81 1.00 1.03 0.90  CALCIUM 8.1*  --   --  8.5    PT/INR:  Recent Labs  02/24/14 1330  LABPROT 15.4*  INR 1.25   ABG    Component Value Date/Time   PHART 7.340* 02/25/2014 0409   HCO3 23.2 02/25/2014 0409   TCO2 24 02/25/2014 1651   ACIDBASEDEF 3.0* 02/25/2014 0409   O2SAT  96.0 02/25/2014 0409   CBG (last 3)   Recent Labs  02/25/14 1956 02/25/14 2344 02/26/14 0459  GLUCAP 192* 224* 116*    Assessment/Plan: S/P Procedure(s) (LRB): Coronary artery bypass graft times three using left internal mammary artery and left leg greater saphenous vein harvested endoscopically. (N/A) INTRAOPERATIVE TRANSESOPHAGEAL ECHOCARDIOGRAM (N/A) CLIPPING OF ATRIAL APPENDAGE (Left) Mobilize Diuresis Diabetes control Plan for transfer to step-down: see transfer orders   LOS: 5 days    Seth [name],Seth [name] 02/26/2014

## 2014-02-26 NOTE — Progress Notes (Signed)
CARDIAC REHAB PHASE I   PRE:  Rate/Rhythm: 72 SR  BP:  Supine:   Sitting: 110/60  Standing:    SaO2: 94 RA  MODE:  Ambulation: 310 ft   POST:  Rate/Rhythm: 81  BP:  Supine:   Sitting: 110/60  Standing:    SaO2: 97 RA 1510-1540 Assisted X 2 and used walker to ambulate. Gait steady with walker. VS stable Pt back to recliner after walk with call light in reach and wife present.  Rodney Langton RN 02/26/2014 3:46 PM

## 2014-02-27 DIAGNOSIS — E119 Type 2 diabetes mellitus without complications: Secondary | ICD-10-CM

## 2014-02-27 DIAGNOSIS — I4891 Unspecified atrial fibrillation: Secondary | ICD-10-CM

## 2014-02-27 LAB — PROTIME-INR
INR: 1.15 (ref 0.00–1.49)
Prothrombin Time: 14.5 seconds (ref 11.6–15.2)

## 2014-02-27 LAB — GLUCOSE, CAPILLARY
Glucose-Capillary: 148 mg/dL — ABNORMAL HIGH (ref 70–99)
Glucose-Capillary: 154 mg/dL — ABNORMAL HIGH (ref 70–99)
Glucose-Capillary: 167 mg/dL — ABNORMAL HIGH (ref 70–99)
Glucose-Capillary: 168 mg/dL — ABNORMAL HIGH (ref 70–99)
Glucose-Capillary: 177 mg/dL — ABNORMAL HIGH (ref 70–99)
Glucose-Capillary: 183 mg/dL — ABNORMAL HIGH (ref 70–99)

## 2014-02-27 LAB — CBC
HCT: 30.2 % — ABNORMAL LOW (ref 39.0–52.0)
Hemoglobin: 9.7 g/dL — ABNORMAL LOW (ref 13.0–17.0)
MCH: 28 pg (ref 26.0–34.0)
MCHC: 32.1 g/dL (ref 30.0–36.0)
MCV: 87.3 fL (ref 78.0–100.0)
Platelets: 102 10*3/uL — ABNORMAL LOW (ref 150–400)
RBC: 3.46 MIL/uL — ABNORMAL LOW (ref 4.22–5.81)
RDW: 14.9 % (ref 11.5–15.5)
WBC: 14.1 10*3/uL — ABNORMAL HIGH (ref 4.0–10.5)

## 2014-02-27 LAB — BASIC METABOLIC PANEL
BUN: 28 mg/dL — ABNORMAL HIGH (ref 6–23)
CO2: 26 mEq/L (ref 19–32)
Calcium: 8.4 mg/dL (ref 8.4–10.5)
Chloride: 103 mEq/L (ref 96–112)
Creatinine, Ser: 0.9 mg/dL (ref 0.50–1.35)
GFR calc Af Amer: >90 mL/min (ref >90)
GFR calc non Af Amer: 84 mL/min — ABNORMAL LOW (ref >90)
Glucose, Bld: 165 mg/dL — ABNORMAL HIGH (ref 70–99)
Potassium: 4.6 mEq/L (ref 3.7–5.3)
Sodium: 140 mEq/L (ref 137–147)

## 2014-02-27 MED ORDER — WARFARIN - PHYSICIAN DOSING INPATIENT
Freq: Every day | Status: DC
  Administered 2014-02-27: 18:00:00

## 2014-02-27 MED ORDER — GLIPIZIDE ER 5 MG PO TB24
5.0000 mg | ORAL_TABLET | Freq: Every day | ORAL | Status: DC
  Administered 2014-02-28 – 2014-03-01 (×2): 5 mg via ORAL
  Filled 2014-02-27 (×3): qty 1

## 2014-02-27 MED ORDER — AMIODARONE HCL 200 MG PO TABS
400.0000 mg | ORAL_TABLET | Freq: Two times a day (BID) | ORAL | Status: DC
  Administered 2014-02-27 – 2014-03-01 (×5): 400 mg via ORAL
  Filled 2014-02-27 (×6): qty 2

## 2014-02-27 MED ORDER — METFORMIN HCL 500 MG PO TABS
1000.0000 mg | ORAL_TABLET | Freq: Two times a day (BID) | ORAL | Status: DC
  Administered 2014-02-27 – 2014-03-01 (×4): 1000 mg via ORAL
  Filled 2014-02-27 (×6): qty 2

## 2014-02-27 MED ORDER — LISINOPRIL 2.5 MG PO TABS
2.5000 mg | ORAL_TABLET | Freq: Every day | ORAL | Status: DC
  Administered 2014-02-27 – 2014-03-01 (×3): 2.5 mg via ORAL
  Filled 2014-02-27 (×3): qty 1

## 2014-02-27 MED ORDER — WARFARIN SODIUM 5 MG PO TABS
5.0000 mg | ORAL_TABLET | Freq: Once | ORAL | Status: AC
  Administered 2014-02-27: 5 mg via ORAL
  Filled 2014-02-27: qty 1

## 2014-02-27 MED ORDER — AMIODARONE IV BOLUS ONLY 150 MG/100ML
150.0000 mg | Freq: Once | INTRAVENOUS | Status: AC
  Administered 2014-02-27: 150 mg via INTRAVENOUS
  Filled 2014-02-27: qty 100

## 2014-02-27 MED ORDER — CARVEDILOL 3.125 MG PO TABS
3.1250 mg | ORAL_TABLET | Freq: Two times a day (BID) | ORAL | Status: DC
  Administered 2014-02-28: 3.125 mg via ORAL
  Filled 2014-02-27 (×3): qty 1

## 2014-02-27 NOTE — Progress Notes (Signed)
Notified MD Roxy Manns of Mr. Mountain going into rapid AFIB; new orders given

## 2014-02-27 NOTE — Progress Notes (Signed)
CARDIAC REHAB PHASE I   PRE:  Rate/Rhythm: 83 SR    BP: sitting 110/60    SaO2: 96 RA  MODE:  Ambulation: 510 ft   POST:  Rate/Rhythm: 85 SR with bigeminy PACS    BP: sitting 144/60     SaO2: 97 RA  Pt moving well with RW. No c/o walking. Pt maintained NSR but began having bigeminy PACs at end of walk. Notified RN. Answered pts questions about stairs at home. Return to recliner. Encouraged x2 more walks, can walk with family. [id number]   Seth [name] CES, ACSM 02/27/2014 11:48 AM

## 2014-02-27 NOTE — Progress Notes (Signed)
Patient evaluated for community based chronic disease management services with [facility] as a benefit of patient's [employer]. Spoke with patient and his wife at bedside to explain [facility] services.  Wife feels she can manage patient's care at this time.  Left contact information and [facility] literature at bedside. Made Inpatient Case Manager aware that [facility] has consulted. Of note, [facility] Care Management services does not replace or interfere with any services that are arranged by inpatient case management or social work.  For additional questions or referrals please contact Corliss Blacker BSN RN [facility] Liaison at [phone].

## 2014-02-27 NOTE — Progress Notes (Signed)
Subjective:  Day 3 s/p CABG x3 and clipping of LA aappendage  Objective:   Vital Signs in the last 24 hours: Temp:  [98.2 F (36.8 C)-98.7 F (37.1 C)] 98.7 F (37.1 C) (03/05 0420) Pulse Rate:  [65-133] 79 (03/05 0643) Resp:  [18-31] 18 (03/05 0420) BP: (115-131)/(56-76) 116/73 mmHg (03/05 0530) SpO2:  [91 %-97 %] 97 % (03/05 0420) Weight:  [259 lb 0.7 oz (117.5 kg)] 259 lb 0.7 oz (117.5 kg) (03/05 0420)  Intake/Output from previous day: 03/04 0701 - 03/05 0700 In: 240 [P.O.:240] Out: 1260 [Urine:1260]  Medications: . amiodarone  400 mg Oral BID  . aspirin EC  81 mg Oral Daily  . atorvastatin  10 mg Oral Daily  . docusate sodium  200 mg Oral Daily  . famotidine  20 mg Oral BID  . furosemide  40 mg Oral Daily  . [START ON 02/28/2014] glipiZIDE  5 mg Oral Q breakfast  . insulin aspart  0-24 Units Subcutaneous TID AC & HS  . metFORMIN  1,000 mg Oral BID WC  . metoprolol tartrate  25 mg Oral BID  . potassium chloride  20 mEq Oral BID  . sodium chloride  3 mL Intravenous Q12H       Physical Exam:   General appearance: alert, cooperative and no distress Neck: no adenopathy, no JVD, supple, symmetrical, trachea midline and thyroid not enlarged, symmetric, no tenderness/mass/nodules Lungs: no wheezing, slightly decreased at bases Heart: regular rate and rhythm Abdomen: soft, non-tender; bowel sounds normal; no masses,  no organomegaly Extremities: trace edema Neurologic: Grossly normal   Rate: 77  Rhythm: normal sinus rhythm  Lab Results:    Recent Labs  02/26/14 0430 02/27/14 0242  NA 139 140  K 4.4 4.6  CL 104 103  CO2 24 26  GLUCOSE 125* 165*  BUN 22 28*  CREATININE 0.90 0.90   No results found for this basename: TROPONINI, CK, MB,  in the last 72 hours Hepatic Function Panel No results found for this basename: PROT, ALBUMIN, AST, ALT, ALKPHOS, BILITOT, BILIDIR, IBILI,  in the last 72 hours  Recent Labs  02/24/14 1330  INR 1.25   BNP (last 3  results) No results found for this basename: PROBNP,  in the last 8760 hours  Lipid Panel     Component Value Date/Time   TRIG 81 12/06/2013 0828   HDL 40 12/06/2013 0828   CHOLHDL 2.6 12/06/2013 0828   LDLCALC 48 12/06/2013 0828      Imaging:  Dg Chest Port 1 View  02/26/2014   CLINICAL DATA:  Postop cardiac surgery  EXAM: PORTABLE CHEST - 1 VIEW  COMPARISON:  Portable chest x-ray of 02/25/2014  FINDINGS: The Swan-Ganz catheter has been removed with a venous sheath remaining in the SVC. No pneumothorax is seen. Cardiomegaly and mild pulmonary vascular congestion remain.  IMPRESSION: Swan-Ganz catheter removed. Little change in cardiomegaly with mild pulmonary vascular congestion   Electronically Signed   By: Ivar Drape M.D.   On: 02/26/2014 08:03      Assessment/Plan:   Active Problems:   Unstable angina   S/P CABG x 3   I/O +2422 since admission. Good diuresis yesterday with -1020. + 4 lbs from pre-op. Developed AF with RVR this morning treated with amiodarone bolus now on 400 mg po bid. In NSR presently. Continue diuresis. Diabetic regimen of metformin and glipizide restarted. Cardiac Rehab. Add low dose ACE-I/ARB with CAD, DM and pre-op reduced EF.   Thomas A.  Claiborne Billings, MD, [facility] 02/27/2014, 8:11 AM

## 2014-02-27 NOTE — Progress Notes (Addendum)
[address]       [place] [address]             [phone]      3 Days Post-Op Procedure(s) (LRB): Coronary artery bypass graft times three using left internal mammary artery and left leg greater saphenous vein harvested endoscopically. (N/A) INTRAOPERATIVE TRANSESOPHAGEAL ECHOCARDIOGRAM (N/A) CLIPPING OF ATRIAL APPENDAGE (Left)  Subjective:  Seth [name] states he feels great.  He is ambulating with assistance of walker.  No BM Objective: Vital signs in last 24 hours: Temp:  [98.2 F (36.8 C)-98.7 F (37.1 C)] 98.7 F (37.1 C) (03/05 0420) Pulse Rate:  [65-133] 79 (03/05 0643) Cardiac Rhythm:  [-] Normal sinus rhythm (03/04 2030) Resp:  [18-31] 18 (03/05 0420) BP: (115-131)/(56-76) 116/73 mmHg (03/05 0530) SpO2:  [91 %-97 %] 97 % (03/05 0420) Weight:  [259 lb 0.7 oz (117.5 kg)] 259 lb 0.7 oz (117.5 kg) (03/05 0420)  Intake/Output from previous day: 03/04 0701 - 03/05 0700 In: 240 [P.O.:240] Out: 1260 [Urine:1260]  General appearance: alert, cooperative and no distress Heart: regular rate and rhythm Lungs: clear to auscultation bilaterally Abdomen: soft, non-tender; bowel sounds normal; no masses,  no organomegaly Extremities: edema 1+ non pitting Wound: clean and dry  Lab Results:  Recent Labs  02/26/14 0430 02/27/14 0242  WBC 16.3* 14.1*  HGB 10.2* 9.7*  HCT 31.3* 30.2*  PLT 103* 102*   BMET:  Recent Labs  02/26/14 0430 02/27/14 0242  NA 139 140  K 4.4 4.6  CL 104 103  CO2 24 26  GLUCOSE 125* 165*  BUN 22 28*  CREATININE 0.90 0.90  CALCIUM 8.5 8.4    PT/INR:  Recent Labs  02/24/14 1330  LABPROT 15.4*  INR 1.25   ABG    Component Value Date/Time   PHART 7.340* 02/25/2014 0409   HCO3 23.2 02/25/2014 0409   TCO2 24 02/25/2014 1651   ACIDBASEDEF 3.0* 02/25/2014 0409   O2SAT 96.0 02/25/2014 0409   CBG (last 3)   Recent Labs  02/26/14 2158 02/27/14 0010 02/27/14 0656  GLUCAP 175* 168* 154*    Assessment/Plan: S/P  Procedure(s) (LRB): Coronary artery bypass graft times three using left internal mammary artery and left leg greater saphenous vein harvested endoscopically. (N/A) INTRAOPERATIVE TRANSESOPHAGEAL ECHOCARDIOGRAM (N/A) CLIPPING OF ATRIAL APPENDAGE (Left)  1. CV- developed rapid Atrial Fibrillation this morning- treated with Amiodarone Bolus and started on PO Amiodarone, currently in NSR, continue Lopressor ( on Coreg preoperatively may benefit to transition back) 2. Pulm- wean oxygen as tolerated, instructed patient on good use of IS 3. Renal- creatinine WNL, weight is 4 lbs above baseline, continue diuresis 4. Expected Blood Loss Anemia- Hgb 9.7 5. DM- sugars are controlled in hospital, will d/c insulin and restart home Metformin and Glipizide 6. Dispo- patient with new onset A.FIb this morning, currently NSR- patient otherwise stable continue current care   LOS: 6 days    Seth [name], Seth [name] 02/27/2014   Chart reviewed, patient examined, agree with above. He had some PAF preop that he did not feel and was diagnosed by Dr. Mercy Moore when he felt an irregular pulse. His baseline HR is in the 60's so he may not tolerate amio and a beta blocker. Will continue to follow. He can be switched to low dose Coreg. He will need to start on Coumadin. He was on Xarelto for a while preop but his wife said they can't afford it since he does not have Part D Medicare.  She would rather have him on Coumadin. He should be able to go home by Saturday.

## 2014-02-28 DIAGNOSIS — I480 Paroxysmal atrial fibrillation: Secondary | ICD-10-CM

## 2014-02-28 DIAGNOSIS — I4891 Unspecified atrial fibrillation: Secondary | ICD-10-CM

## 2014-02-28 LAB — GLUCOSE, CAPILLARY
Glucose-Capillary: 131 mg/dL — ABNORMAL HIGH (ref 70–99)
Glucose-Capillary: 153 mg/dL — ABNORMAL HIGH (ref 70–99)
Glucose-Capillary: 142 mg/dL — ABNORMAL HIGH (ref 70–99)
Glucose-Capillary: 153 mg/dL — ABNORMAL HIGH (ref 70–99)

## 2014-02-28 MED ORDER — CARVEDILOL 6.25 MG PO TABS
6.2500 mg | ORAL_TABLET | Freq: Two times a day (BID) | ORAL | Status: DC
  Administered 2014-02-28 – 2014-03-01 (×2): 6.25 mg via ORAL
  Filled 2014-02-28 (×4): qty 1

## 2014-02-28 MED ORDER — WARFARIN SODIUM 5 MG PO TABS
5.0000 mg | ORAL_TABLET | Freq: Once | ORAL | Status: AC
  Administered 2014-02-28: 5 mg via ORAL
  Filled 2014-02-28: qty 1

## 2014-02-28 NOTE — Progress Notes (Signed)
EPW removed per protocol, wires intact, patient tolerated well. Instructed bedrest for 1 hour, routine vital signs began,  101/62 Seth [name]

## 2014-02-28 NOTE — Progress Notes (Addendum)
      [address]       [place] [address]             [phone]      4 Days Post-Op Procedure(s) (LRB): Coronary artery bypass graft times three using left internal mammary artery and left leg greater saphenous vein harvested endoscopically. (N/A) INTRAOPERATIVE TRANSESOPHAGEAL ECHOCARDIOGRAM (N/A) CLIPPING OF ATRIAL APPENDAGE (Left)  Subjective:  Seth [name] has no complaints this morning.  He is ambulating with assistance of walker.  + BM  Objective: Vital signs in last 24 hours: Temp:  [97.4 F (36.3 C)-98.7 F (37.1 C)] 97.4 F (36.3 C) (03/06 0500) Pulse Rate:  [65-74] 72 (03/06 0500) Cardiac Rhythm:  [-] Normal sinus rhythm (03/05 1945) Resp:  [18] 18 (03/06 0500) BP: (109-132)/(50-81) 127/81 mmHg (03/06 0500) SpO2:  [93 %-96 %] 93 % (03/06 0500) Weight:  [259 lb 0.7 oz (117.5 kg)] 259 lb 0.7 oz (117.5 kg) (03/06 0500)  Intake/Output from previous day: 03/05 0701 - 03/06 0700 In: 520 [P.O.:520] Out: -   General appearance: alert, cooperative and no distress Heart: regular rate and rhythm Lungs: clear to auscultation bilaterally Abdomen: soft, non-tender; bowel sounds normal; no masses,  no organomegaly Extremities: edema trace Wound: clean and dry  Lab Results:  Recent Labs  02/26/14 0430 02/27/14 0242  WBC 16.3* 14.1*  HGB 10.2* 9.7*  HCT 31.3* 30.2*  PLT 103* 102*   BMET:  Recent Labs  02/26/14 0430 02/27/14 0242  NA 139 140  K 4.4 4.6  CL 104 103  CO2 24 26  GLUCOSE 125* 165*  BUN 22 28*  CREATININE 0.90 0.90  CALCIUM 8.5 8.4    PT/INR:  Recent Labs  02/27/14 1915  LABPROT 14.5  INR 1.15   ABG    Component Value Date/Time   PHART 7.340* 02/25/2014 0409   HCO3 23.2 02/25/2014 0409   TCO2 24 02/25/2014 1651   ACIDBASEDEF 3.0* 02/25/2014 0409   O2SAT 96.0 02/25/2014 0409   CBG (last 3)   Recent Labs  02/27/14 1119 02/27/14 1606 02/27/14 2117  GLUCAP 177* 183* 167*    Assessment/Plan: S/P Procedure(s)  (LRB): Coronary artery bypass graft times three using left internal mammary artery and left leg greater saphenous vein harvested endoscopically. (N/A) INTRAOPERATIVE TRANSESOPHAGEAL ECHOCARDIOGRAM (N/A) CLIPPING OF ATRIAL APPENDAGE (Left)  1. CV- NSR this morning- continue Coreg and Amiodarone 2. Pulm- off oxygen, encouraged continued use of IS 3. INR 1.15- will repeat Coumadin at 5 mg daily 4. Renal- remains mildly volume overloaded, weight is up 4 lbs from admission 5. DM- CBGs are well controlled, continue outpatient regimen 6. Dispo- patient is doing well, will d/c EPW, plan to d/c home in AM   LOS: 7 days    Seth [name], Seth [name] 02/28/2014   Chart reviewed, patient examined, agree with above. He looks good overall. Still has some leg edema and weight is 4 lbs over preop. Continue lasix 40 mg daily at discharge. He was on this prior to admission. Continue coumadin for PAF. INR follow-up as outpt.

## 2014-02-28 NOTE — Progress Notes (Signed)
   Subjective:  Day4  S/P CABG/LA clipping   Objective:   Vital Signs in the last 24 hours: Temp:  [97.4 F (36.3 C)-98.7 F (37.1 C)] 97.4 F (36.3 C) (03/06 0500) Pulse Rate:  [65-74] 72 (03/06 0500) Resp:  [18] 18 (03/06 0500) BP: (109-132)/(50-81) 127/81 mmHg (03/06 0500) SpO2:  [93 %-96 %] 93 % (03/06 0500) Weight:  [259 lb 0.7 oz (117.5 kg)] 259 lb 0.7 oz (117.5 kg) (03/06 0500)  Intake/Output from previous day: 03/05 0701 - 03/06 0700 In: 520 [P.O.:520] Out: 240 [Urine:240]  Medications: . amiodarone  400 mg Oral BID  . aspirin EC  81 mg Oral Daily  . atorvastatin  10 mg Oral Daily  . carvedilol  3.125 mg Oral BID WC  . docusate sodium  200 mg Oral Daily  . famotidine  20 mg Oral BID  . furosemide  40 mg Oral Daily  . glipiZIDE  5 mg Oral Q breakfast  . insulin aspart  0-24 Units Subcutaneous TID AC & HS  . lisinopril  2.5 mg Oral Daily  . metFORMIN  1,000 mg Oral BID WC  . sodium chloride  3 mL Intravenous Q12H  . warfarin  5 mg Oral ONCE-1800  . Warfarin - Physician Dosing Inpatient   Does not apply q1800       Physical Exam:   General appearance: alert and cooperative Neck: no JVD, supple, symmetrical, trachea midline and thyroid not enlarged, symmetric, no tenderness/mass/nodules Lungs: decreased BS at bases Heart: regular rate and rhythm Abdomen: soft, non-tender; bowel sounds normal; no masses,  no organomegaly Extremities: 1+ edema Neurologic: Grossly normal   Rate: 86  Rhythm: normal sinus rhythm  Lab Results:    Recent Labs  02/26/14 0430 02/27/14 0242  NA 139 140  K 4.4 4.6  CL 104 103  CO2 24 26  GLUCOSE 125* 165*  BUN 22 28*  CREATININE 0.90 0.90   No results found for this basename: TROPONINI, CK, MB,  in the last 72 hours Hepatic Function Panel No results found for this basename: PROT, ALBUMIN, AST, ALT, ALKPHOS, BILITOT, BILIDIR, IBILI,  in the last 72 hours  Recent Labs  02/27/14 1915  INR 1.15   BNP (last 3  results) No results found for this basename: PROBNP,  in the last 8760 hours  Lipid Panel     Component Value Date/Time   TRIG 81 12/06/2013 0828   HDL 40 12/06/2013 0828   CHOLHDL 2.6 12/06/2013 0828   LDLCALC 48 12/06/2013 0828      Imaging:  No results found.    Assessment/Plan:   Active Problems:   Unstable angina   S/P CABG x 3  I/O +280 yesterday; +2702 since admission; Wt 259. 4 lbs above pre-op. No further AF. Will titrate coreg to 6.25 mg bid. Continue diuresis, Possible dc tomorrow and titrate ACE-I as BP allows.    Troy Sine, MD, [facility] 02/28/2014, 8:02 AM

## 2014-02-28 NOTE — Progress Notes (Signed)
CARDIAC REHAB PHASE I   PRE:  Rate/Rhythm: 73 SR  BP:  Supine:   Sitting: 110/60  Standing:    SaO2: 94 RA  MODE:  Ambulation: 640 ft   POST:  Rate/Rhythm: 85  BP:  Supine:   Sitting: 130/60  Standing:    SaO2: 95 RA 1325-1420 Assisted X 1 and used walker to ambulate. Gait steady with walker. VS stable Pt tolerated ambulation well without c/o. Pt back to recliner after walk with call light in reach. Completed discharge education with pt. and wife. They voice understanding. Pt states that he has watched recovery from heart surgery video.  Rodney Langton RN 02/28/2014 2:21 PM

## 2014-02-28 NOTE — Discharge Instructions (Signed)
Endoscopic Saphenous Vein Harvesting °Care After °Refer to this sheet in the next few weeks. These instructions provide you with information on caring for yourself after your procedure. Your caregiver may also give you more specific instructions. Your treatment has been planned according to current medical practices, but problems sometimes occur. Call your caregiver if you have any problems or questions after your procedure. °HOME CARE INSTRUCTIONS °Medicine °· Take whatever pain medicine your surgeon prescribes. Follow the directions carefully. Do not take over-the-counter pain medicine unless your surgeon says it is okay. Some pain medicine can cause bleeding problems for several weeks after surgery. °· Follow your surgeon's instructions about driving. You will probably not be permitted to drive after heart surgery. °· Take any medicines your surgeon prescribes. Any medicines you took before your heart surgery should be checked with your caregiver before you start taking them again. °Wound care °· Ask your surgeon how long you should keep wearing your elastic bandage or stocking. °· Check the area around your surgical cuts (incisions) whenever your bandages (dressings) are changed. Look for any redness or swelling. °· You will need to return to have the stitches (sutures) or staples taken out. Ask your surgeon when to do that. °· Ask your surgeon when you can shower or bathe. °Activity °· Try to keep your legs raised when you are sitting. °· Do any exercises your caregivers have given you. These may include deep breathing exercises, coughing, walking, or other exercises. °SEEK MEDICAL CARE IF: °· You have any questions about your medicines. °· You have more leg pain, especially if your pain medicine stops working. °· New or growing bruises develop on your leg. °· Your leg swells, feels tight, or becomes red. °· You have numbness in your leg. °SEEK IMMEDIATE MEDICAL CARE IF: °· Your pain gets much worse. °· Blood  or fluid leaks from any of the incisions. °· Your incisions become warm, swollen, or red. °· You have chest pain. °· You have trouble breathing. °· You have a fever. °· You have more pain near your leg incision. °MAKE SURE YOU: °· Understand these instructions. °· Will watch your condition. °· Will get help right away if you are not doing well or get worse. °Document Released: 08/24/2011 Document Revised: 03/05/2012 Document Reviewed: 08/24/2011 °ExitCare® Patient Information ©2014 [employer]. °Coronary Artery Bypass Grafting, Care After °Refer to this sheet in the next few weeks. These instructions provide you with information on caring for yourself after your procedure. Your health care provider may also give you more specific instructions. Your treatment has been planned according to current medical practices, but problems sometimes occur. Call your health care provider if you have any problems or questions after your procedure. °WHAT TO EXPECT AFTER THE PROCEDURE °Recovery from surgery will be different for everyone. Some people feel well after 3 or 4 weeks, while for others it takes longer. After your procedure, it is typical to have the following: °· Nausea and a lack of appetite.   °· Constipation. °· Weakness and fatigue.   °· Depression or irritability.   °· Pain or discomfort at your incision site. °HOME CARE INSTRUCTIONS °· Only take over-the-counter or prescription medicines as directed by your health care provider. Take all medicines exactly as directed. Do not stop taking medicines or start any new medicines without first checking with your health care provider.   °· Take your pulse as directed by your health care provider. °· Perform deep breathing as directed by your health care provider. If you were   given a device called an incentive spirometer, use it to practice deep breathing several times a day. Support your chest with a pillow or your arms when you take deep breaths or cough. °· Keep  incision areas clean, dry, and protected. Remove or change any bandages (dressings) only as directed by your health care provider. You may have skin adhesive strips over the incision areas. Do not take the strips off. They will fall off on their own. °· Check incision areas daily for any swelling, redness, or drainage. °· If incisions were made in your legs, do the following: °· Avoid crossing your legs.   °· Avoid sitting for long periods of time. Change positions every 30 minutes.   °· Elevate your legs when you are sitting.   °· Wear compression stockings as directed by your health care provider. These stockings help keep blood clots from forming in your legs. °· Take showers once your health care provider approves. Until then, only take sponge baths. Pat incisions dry. Do not rub incisions with a washcloth or towel. Do not take tub baths or go swimming until your health care provider approves. °· Eat foods that are high in fiber, such as raw fruits and vegetables, whole grains, beans, and nuts. Meats should be lean cut. Avoid canned, processed, and fried foods. °· Drink enough fluids to keep your urine clear or pale yellow. °· Weigh yourself every day. This helps identify if you are retaining fluid that may make your heart and lungs work harder.   °· Rest and limit activity as directed by your health care provider. You may be instructed to: °· Stop any activity at once if you have chest pain, shortness of breath, irregular heartbeats, or dizziness. Get help right away if you have any of these symptoms. °· Move around frequently for short periods or take short walks as directed by your health care provider. Increase your activities gradually. You may need physical therapy or cardiac rehabilitation to help strengthen your muscles and build your endurance. °· Avoid lifting, pushing, or pulling anything heavier than 10 lb (4.5 kg) for at least 6 weeks after surgery. °· Do not drive until your health care provider  approves.  °· Ask your health care provider when you may return to work and resume sexual activity. °· Follow up with your health care provider as directed.   °SEEK MEDICAL CARE IF: °· You have swelling, redness, increasing pain, or drainage at the site of an incision.   °· You develop a fever.   °· You have swelling in your ankles or legs.   °· You have pain in your legs.   °· You have weight gain of 2 or more pounds a day. °· You are nauseous or vomit. °· You have diarrhea.  °SEEK IMMEDIATE MEDICAL CARE IF: °· You have chest pain that goes to your jaw or arms. °· You have shortness of breath.   °· You have a fast or irregular heartbeat.   °· You notice a "clicking" in your breastbone (sternum) when you move.   °· You have numbness or weakness in your arms or legs. °· You feel dizzy or lightheaded.   °MAKE SURE YOU: °· Understand these instructions. °· Will watch your condition. °· Will get help right away if you are not doing well or get worse. °Document Released: 07/01/2005 Document Revised: 08/14/2013 Document Reviewed: 05/21/2013 °ExitCare® Patient Information ©2014 [employer]. ° °

## 2014-02-28 NOTE — Discharge Summary (Signed)
[address]       [name], [address]             [phone]       Seth [name] 1941-03-06 73 y.o. [id number]  02/21/2014   Gaye Pollack, MD  abnormal stress test/ chest pain CAD   HPI:  The patient is a 73 year old gentleman with a history of type II DM, obesity, hypertension, and the prostate cancer who reports a one month history of progressive dyspnea with exertion, fatigue, and ankle edema. He denies any history of chest pain or pressure. He is followed by Dr. Mercy Moore of nephrology for an elevated BUN with normal creatinine and had a recent visit was noted to have an irregular pulse. An ECG showed rate controlled atrial fibrillation. He was referred to cardiology an echocardiogram showed moderate LV systolic dysfunction with an ejection fraction of 35%. A stress test was abnormal with apical and inferolateral scar with possible ischemia and ejection fraction of 32%. He was admitted this hospitalization for cardiac catheterization and possible intervention depending on results.   Past Medical History   Diagnosis  Date   .  Impotence of organic origin    .  Rash and other nonspecific skin eruption    .  Hypertensive renal disease, benign    .  Overweight    .  Type II or unspecified type diabetes mellitus with renal manifestations, uncontrolled    .  Peripheral vascular disease, unspecified    .  Essential hypertension, benign    .  Hemorrhage of rectum and anus    .  Malignant neoplasm of prostate    .  Thrombocytopenia, unspecified    .  Tobacco use disorder    .  Proteinuria    .  Personal history of malignant neoplasm of prostate    .  Personal history of urinary calculi    .  Other abnormal blood chemistry    .  Other chest pain    .  Slowing of urinary stream    .  Encounter for long-term (current) use of other medications    .  Rash and other nonspecific skin eruption    .  Hemorrhage of gastrointestinal tract, unspecified    .   Other and unspecified hyperlipidemia    .  Calculus of kidney     Past Surgical History   Procedure  Laterality  Date   .  Back surgery     .  Cataract extraction   2010    Family History   Problem  Relation  Age of Onset   .  Cancer  Mother  30     leukemia   .  Heart disease  Father    .  Heart attack  Father  38     Social History: reports that he has never smoked. His smokeless tobacco use includes Chew. He reports that he does not drink alcohol or use illicit drugs.   Allergies: No Known Allergies   Medications:  I have reviewed the patient's current medications.   Prior to Admission:  Prescriptions prior to admission   Medication  Sig  Dispense  Refill   .  amLODipine (NORVASC) 10 MG tablet  Take 10 mg by mouth daily.     [name]  atorvastatin (LIPITOR) 10 MG tablet  Take 1 tablet (10 mg total) by mouth daily.  90 tablet  3   .  Calcium Carbonate-Vitamin D 600-200  MG-UNIT TABS  Take 1 tablet by mouth daily. For bones     .  carvedilol (COREG) 25 MG tablet  Take 37.5 mg by mouth 2 (two) times daily with a meal. Take 1 and 1/2 tablet twice a day for blood pressure     .  CINNAMON PO  Take 1 capsule by mouth daily.     [name]  diltiazem (CARDIZEM CD) 120 MG 24 hr capsule  Take 1 capsule (120 mg total) by mouth daily.  90 capsule  2   .  Flaxseed, Linseed, (FLAXSEED OIL PO)  Take 1 tablet by mouth daily.     .  furosemide (LASIX) 40 MG tablet  Take 1 tablet (40 mg total) by mouth daily.  90 tablet  3   .  GARLIC PO  Take 1 tablet by mouth daily.     [name]  glipiZIDE (GLUCOTROL XL) 5 MG 24 hr tablet  Take 1 tablet once a day for blood sugar.  30 tablet  3   .  lisinopril (PRINIVIL,ZESTRIL) 40 MG tablet  Take 40 mg by mouth daily. To control blood pressure     .  metFORMIN (GLUCOPHAGE) 1000 MG tablet  Take 1,000 mg by mouth 2 (two) times daily with a meal.     .  Multiple Vitamin (MULTIVITAMINS PO)  Take 1 tablet by mouth daily.     .  potassium chloride SA (K-DUR,KLOR-CON) 20 MEQ tablet   Take 1 tablet (20 mEq total) by mouth daily.  90 tablet  3   .  Rivaroxaban (XARELTO) 20 MG TABS tablet  Take 1 tablet (20 mg total) by mouth daily with supper.  90 tablet  2   .  nitroGLYCERIN (NITROSTAT) 0.4 MG SL tablet  Place 1 tablet (0.4 mg total) under the tongue every 5 (five) minutes as needed for chest pain.  25 tablet      Hospital Course:   The patient was admitted and taken to the cardiac catheterization lab by Peter Martinique M.D. He was found to have severe left main and three-vessel coronary artery disease with moderate LV dysfunction. Due to these findings cardiothoracic surgical consultation was obtained with Gilford Raid M.D. who evaluated the patient and studies and agreed with recommendations to proceed with coronary artery surgical revascularization.  Procedure: On 02/24/2014 he was taken to the operating room at which time he underwent the following surgery:  CARDIOVASCULAR SURGERY OPERATIVE NOTE  02/24/2014  Surgeon: Gaye Pollack, MD  First Assistant: Ellwood Handler, PA-C  Preoperative Diagnosis: Severe multi-vessel coronary artery disease, Paroxysmal atrial fibrillation  Postoperative Diagnosis: Same  Procedure:  1. Median Sternotomy 2. Extracorporeal circulation 3. Coronary artery bypass grafting x 3  Left internal mammary graft to the LAD  SVG to OM  SVG to PDA 4. Endoscopic vein harvest from the left leg  5. Clipping of left atrial appendage  Anesthesia: General Endotracheal  The patient tolerated the procedure well and was taken to the surgical intensive care unit in stable condition.   Postoperative hospital course: Overall the patient has done well. He was weaned from the ventilator without difficulty using standard protocols. He has remained hemodynamically stable. He has had recurrence of his atrial fibrillation postoperatively but currently maintaining sinus rhythm on Coreg and amiodarone. He has also been started on Coumadin. All routine lines, monitors  and drainage devices have been discontinued in the standard fashion. Diabetes has been under good control using standard protocols with transition to his outpatient regimen. He  does have a moderate postoperative volume overload but has responded well to diuretics. Oxygen has been weaned and he maintains good saturations on room air. He does have an expected acute blood loss anemia which has stabilized. He is tolerating gradually increasing activities using standard postoperative protocols. Incisions are noted to be healing well without evidence of infection. Overall his status is felt to be tentatively stable for discharge in the next 48 hours pending ongoing reevaluation of his recovery.     Recent Labs  02/26/14 0430 02/27/14 0242  NA 139 140  K 4.4 4.6  CL 104 103  CO2 24 26  GLUCOSE 125* 165*  BUN 22 28*  CALCIUM 8.5 8.4    Recent Labs  02/26/14 0430 02/27/14 0242  WBC 16.3* 14.1*  HGB 10.2* 9.7*  HCT 31.3* 30.2*  PLT 103* 102*    Recent Labs  02/27/14 1915  INR 1.15     Discharge Instructions:  The patient is discharged to home with extensive instructions on wound care and progressive ambulation.  They are instructed not to drive or perform any heavy lifting until returning to see the physician in his office.  Discharge Diagnosis:  abnormal stress test/ chest pain CAD Atrial fibrillation Secondary Diagnosis: Patient Active Problem List   Diagnosis Date Noted  . S/P CABG x 3 02/24/2014  . Unstable angina 02/21/2014  . Obesity, morbid 05/22/2013  . Other malaise and fatigue 05/22/2013  . CKD (chronic kidney disease) 05/22/2013  . Other and unspecified hyperlipidemia 05/22/2013  . Overweight 03/16/2011  . Type II or unspecified type diabetes mellitus with renal manifestations, uncontrolled 09/14/2010  . Hypertension, benign 06/10/2010  . PVD (peripheral vascular disease) 05/12/2010  . Rectal bleeding 05/12/2010  . Routine general medical examination at a health  care facility 05/12/2010  . Prostate cancer 02/10/2010  . Thrombocytopenia 02/10/2010  . Type II or unspecified type diabetes mellitus with renal manifestations, not stated as uncontrolled 11/11/2009  . Obesity, unspecified 11/11/2009  . Tobacco user 11/11/2009  . Proteinuria 09/30/2009  . Impotence, organic 10/08/2007  . Personal history of malignant neoplasm of prostate 10/08/2007  . Personal history of urinary calculi 10/08/2007  . Type II or unspecified type diabetes mellitus without mention of complication, uncontrolled 09/04/2007  . Type II or unspecified type diabetes mellitus without mention of complication, not stated as uncontrolled 11/28/2006  . Stress hyperglycemia 09/21/2006  . Other chest pain 09/08/2006  . Slowing of urinary stream 09/08/2006  . Encounter for long-term (current) use of medications 09/08/2006  . HTN (hypertension) 10/27/2005  . Rash 06/22/2004  . Hemorrhage of gastrointestinal tract, unspecified 02/23/2004  . Coronary atherosclerosis of unspecified type of vessel, native or graft 02/04/2000  . Hyperlipidemia 04/26/1999  . Urolithiasis 12/26/1997   Past Medical History  Diagnosis Date  . Impotence of organic origin   . Rash and other nonspecific skin eruption   . Hypertensive renal disease, benign   . Overweight   . Type II or unspecified type diabetes mellitus with renal manifestations, uncontrolled   . Peripheral vascular disease, unspecified   . Essential hypertension, benign   . Hemorrhage of rectum and anus   . Malignant neoplasm of prostate   . Thrombocytopenia, unspecified   . Tobacco use disorder   . Proteinuria   . Personal history of malignant neoplasm of prostate   . Personal history of urinary calculi   . Other abnormal blood chemistry   . Other chest pain   . Slowing of urinary stream   .  Encounter for long-term (current) use of other medications   . Rash and other nonspecific skin eruption   . Hemorrhage of gastrointestinal  tract, unspecified   . Other and unspecified hyperlipidemia   . Calculus of kidney      Medications at discharge:    Medication List    STOP taking these medications       amLODipine 10 MG tablet  Commonly known as:  NORVASC     CINNAMON PO     diltiazem 120 MG 24 hr capsule  Commonly known as:  CARDIZEM CD     nitroGLYCERIN 0.4 MG SL tablet  Commonly known as:  NITROSTAT     Rivaroxaban 20 MG Tabs tablet  Commonly known as:  XARELTO      TAKE these medications       amiodarone 400 MG tablet  Commonly known as:  PACERONE  Take 1 tablet (400 mg total) by mouth 2 (two) times daily. X 1 week, then 200 mg po twice daily     aspirin 81 MG EC tablet  Take 1 tablet (81 mg total) by mouth daily.     atorvastatin 10 MG tablet  Commonly known as:  LIPITOR  Take 1 tablet (10 mg total) by mouth daily.     Calcium Carbonate-Vitamin D 600-200 MG-UNIT Tabs  Take 1 tablet by mouth daily. For bones     carvedilol 6.25 MG tablet  Commonly known as:  COREG  Take 1 tablet (6.25 mg total) by mouth 2 (two) times daily with a meal.     FLAXSEED OIL PO  Take 1 tablet by mouth daily.     furosemide 40 MG tablet  Commonly known as:  LASIX  Take 1 tablet (40 mg total) by mouth daily.     GARLIC PO  Take 1 tablet by mouth daily.     glipiZIDE 5 MG 24 hr tablet  Commonly known as:  GLUCOTROL XL  Take 1 tablet once a day for blood sugar.     lisinopril 2.5 MG tablet  Commonly known as:  PRINIVIL,ZESTRIL  Take 1 tablet (2.5 mg total) by mouth daily.     metFORMIN 1000 MG tablet  Commonly known as:  GLUCOPHAGE  Take 1,000 mg by mouth 2 (two) times daily with a meal.     MULTIVITAMINS PO  Take 1 tablet by mouth daily.     oxyCODONE 5 MG immediate release tablet  Commonly known as:  Oxy IR/ROXICODONE  Take 1-2 tablets (5-10 mg total) by mouth every 3 (three) hours as needed for severe pain.     potassium chloride SA 20 MEQ tablet  Commonly known as:  K-DUR,KLOR-CON  Take  1 tablet (20 mEq total) by mouth daily.     warfarin 5 MG tablet  Commonly known as:  COUMADIN  Take 1 tablet (5 mg total) by mouth daily. Or as directed by [facility]       Follow Up: Follow-up Information   Follow up with Gaye Pollack, MD. (03/19/2014 at 3 PM to see Dr. Cyndia Bent. Please obtain a chest x-ray at [place] at 2 PM. [facility] imaging is located in the same office complex.)    Specialty:  Cardiothoracic Surgery   Contact information:   [address] [place] [address] [phone]       Follow up with Peter Martinique, MD. (2 weeks-please call office to arrange appointment)    Specialty:  Cardiology   Contact information:  [address] [place] [address] [phone]       Follow up with [facility]. (Please have bloodwork for Coumadin (PT/INR) drawn on Monday, 3/9)    Specialty:  Cardiology   Contact information:   [address], [place] [address] [phone]        The patient has been discharged on:   1.Beta Blocker:  Yes [  x ]                              No   [   ]                              If No, reason:  2.Ace Inhibitor/ARB: Yes [ x  ]                                     No  [    ]                                     If No, reason:  3.Statin:   Yes [ x  ]                  No  [   ]                  If No, reason:  4.Ecasa:  Yes  [ x  ]                  No   [   ]                  If No, reason:      Jadene Pierini, PA-C 02/28/2014  9:50 AM

## 2014-03-01 LAB — PROTIME-INR
INR: 1.22 (ref 0.00–1.49)
Prothrombin Time: 15.1 seconds (ref 11.6–15.2)

## 2014-03-01 LAB — GLUCOSE, CAPILLARY: Glucose-Capillary: 127 mg/dL — ABNORMAL HIGH (ref 70–99)

## 2014-03-01 MED ORDER — OXYCODONE HCL 5 MG PO TABS: 5.0000 mg | ORAL_TABLET | ORAL | Status: DC | PRN

## 2014-03-01 MED ORDER — CARVEDILOL 6.25 MG PO TABS: 6.2500 mg | ORAL_TABLET | Freq: Two times a day (BID) | ORAL | Status: DC

## 2014-03-01 MED ORDER — WARFARIN SODIUM 5 MG PO TABS: 5.0000 mg | ORAL_TABLET | Freq: Every day | ORAL | Status: DC

## 2014-03-01 MED ORDER — ASPIRIN 81 MG PO TBEC: 81.0000 mg | DELAYED_RELEASE_TABLET | Freq: Every day | ORAL | Status: DC

## 2014-03-01 MED ORDER — POTASSIUM CHLORIDE CRYS ER 20 MEQ PO TBCR
20.0000 meq | EXTENDED_RELEASE_TABLET | Freq: Every day | ORAL | Status: DC
Start: 2014-03-01 — End: 2014-05-27

## 2014-03-01 MED ORDER — LISINOPRIL 2.5 MG PO TABS: 2.5000 mg | ORAL_TABLET | Freq: Every day | ORAL | Status: DC

## 2014-03-01 MED ORDER — FUROSEMIDE 40 MG PO TABS: 40.0000 mg | ORAL_TABLET | Freq: Every day | ORAL | Status: DC

## 2014-03-01 MED ORDER — AMIODARONE HCL 400 MG PO TABS: 400.0000 mg | ORAL_TABLET | Freq: Two times a day (BID) | ORAL | Status: DC

## 2014-03-01 NOTE — Progress Notes (Signed)
   Subjective:  Day 5  S/P CABG/LA clipping   Objective:   Vital Signs in the last 24 hours: Temp:  [97.3 F (36.3 C)-98.5 F (36.9 C)] 98.5 F (36.9 C) (03/07 0454) Pulse Rate:  [68-74] 68 (03/07 0454) Resp:  [18] 18 (03/07 0454) BP: (101-145)/(62-76) 123/70 mmHg (03/07 0454) SpO2:  [93 %-96 %] 95 % (03/07 0454) Weight:  [257 lb 8 oz (116.8 kg)] 257 lb 8 oz (116.8 kg) (03/07 0454)  Intake/Output from previous day: 03/06 0701 - 03/07 0700 In: 480 [P.O.:480] Out: 150 [Urine:150]  Medications: . amiodarone  400 mg Oral BID  . aspirin EC  81 mg Oral Daily  . atorvastatin  10 mg Oral Daily  . carvedilol  6.25 mg Oral BID WC  . docusate sodium  200 mg Oral Daily  . famotidine  20 mg Oral BID  . glipiZIDE  5 mg Oral Q breakfast  . insulin aspart  0-24 Units Subcutaneous TID AC & HS  . lisinopril  2.5 mg Oral Daily  . metFORMIN  1,000 mg Oral BID WC  . sodium chloride  3 mL Intravenous Q12H  . Warfarin - Physician Dosing Inpatient   Does not apply q1800       Physical Exam:   General appearance: alert and cooperative Neck: no JVD, supple, symmetrical, trachea midline and thyroid not enlarged, symmetric, no tenderness/mass/nodules Lungs: decreased BS at bases Heart: regular rate and rhythm Abdomen: soft, non-tender; bowel sounds normal; no masses,  no organomegaly Extremities: 1+- 2+  edema Neurologic: Grossly normal   Rate: 86  Rhythm: normal sinus rhythm  Lab Results:     Recent Labs  02/27/14 0242  NA 140  K 4.6  CL 103  CO2 26  GLUCOSE 165*  BUN 28*  CREATININE 0.90   No results found for this basename: TROPONINI, CK, MB,  in the last 72 hours Hepatic Function Panel No results found for this basename: PROT, ALBUMIN, AST, ALT, ALKPHOS, BILITOT, BILIDIR, IBILI,  in the last 72 hours  Recent Labs  03/01/14 0348  INR 1.22   BNP (last 3 results) No results found for this basename: PROBNP,  in the last 8760 hours  Lipid Panel     Component Value  Date/Time   TRIG 81 12/06/2013 0828   HDL 40 12/06/2013 0828   CHOLHDL 2.6 12/06/2013 0828   LDLCALC 48 12/06/2013 0828      Imaging:  No results found.    Assessment/Plan:   Active Problems:   Unstable angina   S/P CABG x 3   Atrial fibrillation  1. CAD:  Doing well post CABG.  Possible DC to home today  2. Atrial fib:  S/p atrial clipping, cont. Amiodarone. Warfarin.  3. Leg edema:  Instructed him to ambulate more and to elevate legs to help with leg edema.    Seth Sine, MD, [facility] 03/01/2014, 10:22 AM

## 2014-03-01 NOTE — Progress Notes (Signed)
       [address]       [place] [address]             [phone]          5 Days Post-Op Procedure(s) (LRB): Coronary artery bypass graft times three using left internal mammary artery and left leg greater saphenous vein harvested endoscopically. (N/A) INTRAOPERATIVE TRANSESOPHAGEAL ECHOCARDIOGRAM (N/A) CLIPPING OF ATRIAL APPENDAGE (Left)  Subjective: Feels well, no complaints.  Wants to go home.   Objective: Vital signs in last 24 hours: Patient Vitals for the past 24 hrs:  BP Temp Temp src Pulse Resp SpO2 Weight  03/01/14 0454 123/70 mmHg 98.5 F (36.9 C) Oral 68 18 95 % 257 lb 8 oz (116.8 kg)  02/28/14 2014 108/62 mmHg 97.3 F (36.3 C) Oral 69 18 95 % -  02/28/14 1422 145/76 mmHg 97.8 F (36.6 C) Oral 74 18 95 % -  02/28/14 1215 128/66 mmHg - - 72 - 93 % -  02/28/14 1200 121/76 mmHg - - 72 - 96 % -  02/28/14 1145 126/63 mmHg - - - - 94 % -  02/28/14 1136 101/62 mmHg - - 74 - 95 % -   Current Weight  03/01/14 257 lb 8 oz (116.8 kg)     Intake/Output from previous day: 03/06 0701 - 03/07 0700 In: 480 [P.O.:480] Out: 150 [Urine:150]  CBGs 142-153-127    PHYSICAL EXAM:  Heart: RRR Lungs: Clear Wound: Clean and dry Extremities: + LE edema    Lab Results: CBC: Recent Labs  02/27/14 0242  WBC 14.1*  HGB 9.7*  HCT 30.2*  PLT 102*   BMET:  Recent Labs  02/27/14 0242  NA 140  K 4.6  CL 103  CO2 26  GLUCOSE 165*  BUN 28*  CREATININE 0.90  CALCIUM 8.4    PT/INR:  Recent Labs  03/01/14 0348  LABPROT 15.1  INR 1.22      Assessment/Plan: S/P Procedure(s) (LRB): Coronary artery bypass graft times three using left internal mammary artery and left leg greater saphenous vein harvested endoscopically. (N/A) INTRAOPERATIVE TRANSESOPHAGEAL ECHOCARDIOGRAM (N/A) CLIPPING OF ATRIAL APPENDAGE (Left) Stable, progressing well. Plan d/c home- instructions reviewed with patient.   LOS: 8 days    Sherin Murdoch  H 03/01/2014

## 2014-03-03 ENCOUNTER — Ambulatory Visit (INDEPENDENT_AMBULATORY_CARE_PROVIDER_SITE_OTHER): Payer: Medicare Other

## 2014-03-03 DIAGNOSIS — I4891 Unspecified atrial fibrillation: Secondary | ICD-10-CM

## 2014-03-03 LAB — POCT INR: INR: 1.6

## 2014-03-03 NOTE — Patient Instructions (Signed)

## 2014-03-04 ENCOUNTER — Ambulatory Visit: Payer: Medicare Other | Admitting: Cardiovascular Disease

## 2014-03-06 ENCOUNTER — Ambulatory Visit (INDEPENDENT_AMBULATORY_CARE_PROVIDER_SITE_OTHER): Payer: Medicare Other | Admitting: Pharmacist Clinician (PhC)/ Clinical Pharmacy Specialist

## 2014-03-06 ENCOUNTER — Ambulatory Visit (INDEPENDENT_AMBULATORY_CARE_PROVIDER_SITE_OTHER): Payer: Self-pay

## 2014-03-06 DIAGNOSIS — I2510 Atherosclerotic heart disease of native coronary artery without angina pectoris: Secondary | ICD-10-CM

## 2014-03-06 DIAGNOSIS — I4891 Unspecified atrial fibrillation: Secondary | ICD-10-CM

## 2014-03-06 DIAGNOSIS — Z951 Presence of aortocoronary bypass graft: Secondary | ICD-10-CM

## 2014-03-06 DIAGNOSIS — Z4802 Encounter for removal of sutures: Secondary | ICD-10-CM

## 2014-03-06 LAB — POCT INR: INR: 2.3

## 2014-03-06 NOTE — Progress Notes (Signed)
Removed 3 sutures from chest tube incisions. No signs of infection and patient tolerated well.

## 2014-03-11 ENCOUNTER — Other Ambulatory Visit: Payer: Self-pay | Admitting: *Deleted

## 2014-03-11 DIAGNOSIS — I2510 Atherosclerotic heart disease of native coronary artery without angina pectoris: Secondary | ICD-10-CM

## 2014-03-12 ENCOUNTER — Ambulatory Visit (INDEPENDENT_AMBULATORY_CARE_PROVIDER_SITE_OTHER): Payer: Medicare Other | Admitting: Pharmacist

## 2014-03-12 DIAGNOSIS — I4891 Unspecified atrial fibrillation: Secondary | ICD-10-CM | POA: Diagnosis not present

## 2014-03-12 LAB — POCT INR: INR: 1.5

## 2014-03-17 ENCOUNTER — Ambulatory Visit (INDEPENDENT_AMBULATORY_CARE_PROVIDER_SITE_OTHER): Payer: Medicare Other | Admitting: *Deleted

## 2014-03-17 ENCOUNTER — Ambulatory Visit (INDEPENDENT_AMBULATORY_CARE_PROVIDER_SITE_OTHER): Payer: Medicare Other | Admitting: Physician Assistant

## 2014-03-17 ENCOUNTER — Encounter: Payer: Self-pay | Admitting: Physician Assistant

## 2014-03-17 ENCOUNTER — Telehealth: Payer: Self-pay | Admitting: *Deleted

## 2014-03-17 VITALS — BP 130/78 | HR 65 | Ht 70.0 in | Wt 241.0 lb

## 2014-03-17 DIAGNOSIS — I5022 Chronic systolic (congestive) heart failure: Secondary | ICD-10-CM | POA: Diagnosis not present

## 2014-03-17 DIAGNOSIS — I10 Essential (primary) hypertension: Secondary | ICD-10-CM | POA: Diagnosis not present

## 2014-03-17 DIAGNOSIS — I2510 Atherosclerotic heart disease of native coronary artery without angina pectoris: Secondary | ICD-10-CM

## 2014-03-17 DIAGNOSIS — I4891 Unspecified atrial fibrillation: Secondary | ICD-10-CM

## 2014-03-17 DIAGNOSIS — I2589 Other forms of chronic ischemic heart disease: Secondary | ICD-10-CM

## 2014-03-17 DIAGNOSIS — Z5181 Encounter for therapeutic drug level monitoring: Secondary | ICD-10-CM

## 2014-03-17 DIAGNOSIS — E785 Hyperlipidemia, unspecified: Secondary | ICD-10-CM

## 2014-03-17 LAB — BASIC METABOLIC PANEL
BUN: 19 mg/dL (ref 6–23)
CO2: 28 mEq/L (ref 19–32)
Calcium: 9.2 mg/dL (ref 8.4–10.5)
Chloride: 104 mEq/L (ref 96–112)
Creatinine, Ser: 1 mg/dL (ref 0.4–1.5)
GFR: 78.13 mL/min (ref >60.00)
Glucose, Bld: 96 mg/dL (ref 70–99)
Potassium: 3.7 mEq/L (ref 3.5–5.1)
Sodium: 140 mEq/L (ref 135–145)

## 2014-03-17 LAB — POCT INR: INR: 1.7

## 2014-03-17 MED ORDER — AMIODARONE HCL 200 MG PO TABS: 200.0000 mg | ORAL_TABLET | Freq: Every day | ORAL | Status: DC

## 2014-03-17 MED ORDER — LISINOPRIL 5 MG PO TABS: 5.0000 mg | ORAL_TABLET | Freq: Every day | ORAL | Status: DC

## 2014-03-17 MED ORDER — ASPIRIN EC 81 MG PO TBEC: 81.0000 mg | DELAYED_RELEASE_TABLET | Freq: Every day | ORAL | Status: DC

## 2014-03-17 NOTE — Telephone Encounter (Signed)
pt notified about lab results with verbal udnerstanding

## 2014-03-17 NOTE — Progress Notes (Signed)
[address] [place], [place]  [address] Phone: [phone] Fax:  [phone]  Date:  03/17/2014   ID:  Seth [name], DOB 15-Jul-1941, MRN [id number]  PCP:  Blanchie Serve, MD  Cardiologist:  Dr. Lauree Chandler     History of Present Illness: Seth [name] is a 73 y.o. male with a hx of DM, HTN, HL, prostate CA, renal insufficiency, obesity.  He was recently evaluated by Dr. Lauree Chandler for new onset AFib.  CHADS2-VASc=3 (DM, HTN, age > 88).  He was placed on Xarelto.  He was set up for echo and stress test.   Stress test was abnormal with apical and inferolateral scar and possible ischemia.  EF is 32%.  EF is 35-40% on echo with RWMA.  He was referred for cardiac catheterization which demonstrated critical LM and 3 vessel CAD. He was referred for CABG with Dr. Cyndia Bent. Surgery was performed on 02/24/14 (LIMA-LAD, SVG-OM, SVG-PDA).  He went back in atrial fibrillation postoperatively. He was placed on amiodarone and converted to NSR. He was started on Coumadin instead of Xarelto.  Since d/c, he is doing well.  Denies any fever or cough.  He is breathing better.  No orthopnea, PND, edema.  Weights are stable.  No syncope or near syncope.   Recent Labs: 12/06/2013: HDL Cholesterol 40; LDL (calc) 48  02/12/2014: TSH 1.79  02/23/2014: ALT 16  02/27/2014: Creatinine 0.90; Hemoglobin 9.7*; Potassium 4.6   Wt Readings from Last 3 Encounters:  03/17/14 241 lb (109.317 kg)  03/01/14 257 lb 8 oz (116.8 kg)  03/01/14 257 lb 8 oz (116.8 kg)     Past Medical History  Diagnosis Date  . Impotence of organic origin   . CKD (chronic kidney disease)     proteinuria  . Type II or unspecified type diabetes mellitus with renal manifestations, uncontrolled   . Peripheral vascular disease, unspecified   . HTN (hypertension)   . Hemorrhage of rectum and anus   . Prostate cancer   . Thrombocytopenia, unspecified   . Tobacco use disorder   . Nephrolithiasis   . Hemorrhage  of gastrointestinal tract, unspecified   . HLD (hyperlipidemia)   . CAD (coronary artery disease)     a.  Lexiscan Myoview (02/14/14):  Apical, inferolateral scar; ? Mild ischemia; EF 32%.;  b.  LHC (02/21/14):  dLM 100%, LAD 100%, CFX 100%, dRCA 50%. => CABG (L-LAD, S-OM, S-PDA)  . Ischemic cardiomyopathy     a. Echo (02/17/14):  EF 35-40%, anteroseptal and apical AK, Gr 2 DD, MAC, mod LAE.  [name] Chronic systolic heart failure   . Atrial fibrillation     a. coumadin;  b. Amiodarone    Current Outpatient Prescriptions  Medication Sig Dispense Refill  . amiodarone (PACERONE) 400 MG tablet Take 1 tablet (400 mg total) by mouth 2 (two) times daily. X 1 week, then 200 mg po twice daily  75 tablet  1  . atorvastatin (LIPITOR) 10 MG tablet Take 1 tablet (10 mg total) by mouth daily.  90 tablet  3  . Calcium Carbonate-Vitamin D 600-200 MG-UNIT TABS Take 1 tablet by mouth daily. For bones       . carvedilol (COREG) 6.25 MG tablet Take 1 tablet (6.25 mg total) by mouth 2 (two) times daily with a meal.  60 tablet  1  . Flaxseed, Linseed, (FLAXSEED OIL PO) Take 1 tablet by mouth daily.       . furosemide (LASIX) 40 MG  tablet Take 1 tablet (40 mg total) by mouth daily.  30 tablet  1  . GARLIC PO Take 1 tablet by mouth daily.       [name] glipiZIDE (GLUCOTROL XL) 5 MG 24 hr tablet Take 1 tablet once a day for blood sugar.  30 tablet  3  . lisinopril (PRINIVIL,ZESTRIL) 2.5 MG tablet Take 1 tablet (2.5 mg total) by mouth daily.  30 tablet  1  . metFORMIN (GLUCOPHAGE) 1000 MG tablet Take 1,000 mg by mouth 2 (two) times daily with a meal.      . Multiple Vitamin (MULTIVITAMINS PO) Take 1 tablet by mouth daily.        [name] oxyCODONE (OXY IR/ROXICODONE) 5 MG immediate release tablet Take 1-2 tablets (5-10 mg total) by mouth every 3 (three) hours as needed for severe pain.  30 tablet  0  . potassium chloride SA (K-DUR,KLOR-CON) 20 MEQ tablet Take 1 tablet (20 mEq total) by mouth daily.  60 tablet  1  . warfarin (COUMADIN) 5  MG tablet Take 1 tablet (5 mg total) by mouth daily. Or as directed by [facility]  60 tablet  1   No current facility-administered medications for this visit.    Allergies:   Review of patient's allergies indicates no known allergies.   Social History:  The patient  reports that he has never smoked. His smokeless tobacco use includes Chew. He reports that he does not drink alcohol or use illicit drugs.   Family History:  The patient's family history includes Cancer (age of onset: 34) in his mother; Heart attack (age of onset: 87) in his father; Heart disease in his father.   ROS:  Please see the history of present illness.     All other systems reviewed and negative.   PHYSICAL EXAM: VS:  BP 130/78  Pulse 65  Ht 5\' 10"  (1.778 m)  Wt 241 lb (109.317 kg)  BMI 34.58 kg/m2 Well nourished, well developed, in no acute distress HEENT: normal Neck: no JVD Cardiac:  normal S1, S2; RRR; no murmur Lungs:  clear to auscultation bilaterally, no wheezing, rhonchi or rales Abd: soft, nontender, no hepatomegaly Ext: no LE edema Skin: warm and dry Neuro:  CNs 2-12 intact, no focal abnormalities noted  EKG:     NSR, HR 65, normal axis, TWI 1, aVL  ASSESSMENT AND PLAN:  1. CAD s/p CABG:  Doing well after recent CABG.  Refer to cardiac rehab.  Continue ASA, statin. 2. Ischemic Cardiomyopathy:  Continue beta blocker at current dose.  Increase Lisinopril to 5 QD.  Consider spironolactone in the future.  Arrange f/u echo 90 days after CABG. 3. Chronic Systolic CHF:  Volume stable.  Check BMET today and repeat in 1 week.  4. Atrial Fibrillation:  Maintaining NSR.  Continue coumadin.  Decrease Amiodarone to 200 QD.  PFTs done in the hospital.  Recent TSH and LFTs ok. He will probably need to remain on Amiodarone long term.   5. Hypertension:  Controlled.  6. Hyperlipidemia:  Continue statin.  Managed by primary care.  7. Chronic Kidney Disease:  Creatinines have been normal of  late. 8. Disposition:  F/u with Dr. Lauree Chandler in 4-6 weeks.    Signed, [name], PA-C  03/17/2014 8:35 AM

## 2014-03-17 NOTE — Patient Instructions (Addendum)
INCREASE LISINOPRIL TO 5 MG DAILY; NEW RX SENT IN   DECREASE AMIODARONE TO 200 MG DAILY; NEW REFILL SENT IN FOR THE 200 MG TABLET  LAB WORK TODAY; BMET  REPEAT LAB WORK IN 1 WEEK (BMET)  Your physician has requested that you have an echocardiogram THIS IS TO BE DONE AFTER 05/27/14 DX 428.22. Echocardiography is a painless test that uses sound waves to create images of your heart. It provides your doctor with information about the size and shape of your heart and how well your heart's chambers and valves are working. This procedure takes approximately one hour. There are no restrictions for this procedure.   You have been referred to [place]  Your physician recommends that you schedule a follow-up appointment in: [place]. Angelena Form

## 2014-03-18 ENCOUNTER — Ambulatory Visit (INDEPENDENT_AMBULATORY_CARE_PROVIDER_SITE_OTHER): Payer: Medicare Other | Admitting: Internal Medicine

## 2014-03-18 ENCOUNTER — Encounter: Payer: Self-pay | Admitting: Internal Medicine

## 2014-03-18 VITALS — BP 144/76 | HR 66 | Temp 98.2°F | Resp 14 | Wt 242.8 lb

## 2014-03-18 DIAGNOSIS — D649 Anemia, unspecified: Secondary | ICD-10-CM

## 2014-03-18 DIAGNOSIS — I2510 Atherosclerotic heart disease of native coronary artery without angina pectoris: Secondary | ICD-10-CM | POA: Diagnosis not present

## 2014-03-18 DIAGNOSIS — E785 Hyperlipidemia, unspecified: Secondary | ICD-10-CM

## 2014-03-18 DIAGNOSIS — Z720 Tobacco use: Secondary | ICD-10-CM

## 2014-03-18 DIAGNOSIS — I4891 Unspecified atrial fibrillation: Secondary | ICD-10-CM

## 2014-03-18 DIAGNOSIS — D72829 Elevated white blood cell count, unspecified: Secondary | ICD-10-CM | POA: Insufficient documentation

## 2014-03-18 DIAGNOSIS — I10 Essential (primary) hypertension: Secondary | ICD-10-CM | POA: Diagnosis not present

## 2014-03-18 DIAGNOSIS — E1165 Type 2 diabetes mellitus with hyperglycemia: Principal | ICD-10-CM

## 2014-03-18 DIAGNOSIS — IMO0001 Reserved for inherently not codable concepts without codable children: Secondary | ICD-10-CM

## 2014-03-18 DIAGNOSIS — F17200 Nicotine dependence, unspecified, uncomplicated: Secondary | ICD-10-CM

## 2014-03-18 MED ORDER — GLIPIZIDE ER 2.5 MG PO TB24: ORAL_TABLET | ORAL | Status: DC

## 2014-03-18 NOTE — Progress Notes (Signed)
Patient ID: Seth [name], male   DOB: 1941-07-22, 73 y.o.   MRN: [id number]    Chief Complaint  Patient presents with  . Follow-up    diabetes, HTN   No Known Allergies  HPI 73 y/o male pt is here for routine follow up. He has history of DM type 2 and hypertension. He has now had new onset afib and underwent CABG for 3 vessel CAD since I saw him last. He is now on coumadin and follows with cardiology. He is compliant with his medications.  His cbg at home have been upto 154 fasting but recent readings have fasting between 60-70. He is on glipizide and metformin. His inr is checked in cardiology clinic. He has lost 15 lbs since last visit. He has been careful with his diet  Review of Systems  Constitutional: Negative for fever, chills, malaise/fatigue and diaphoresis.  HENT: Negative for congestion, hearing loss and sore throat.   Eyes: Negative for blurred vision, double vision and discharge.  Respiratory: Negative for cough, sputum production, shortness of breath and wheezing.   Cardiovascular: Negative for chest pain, palpitations, orthopnea and leg swelling.  Gastrointestinal: Negative for heartburn, nausea, vomiting, abdominal pain, diarrhea and constipation.  Genitourinary: Negative for dysuria, urgency, frequency and flank pain.  Musculoskeletal: Negative for back pain, falls, joint pain and myalgias.  Neurological: Negative for dizziness, tingling, focal weakness and headaches.  Psychiatric/Behavioral: Negative for depression and memory loss. The patient is not nervous/anxious.    Past Medical History  Diagnosis Date  . Impotence of organic origin   . CKD (chronic kidney disease)     proteinuria  . Type II or unspecified type diabetes mellitus with renal manifestations, uncontrolled   . Peripheral vascular disease, unspecified   . HTN (hypertension)   . Hemorrhage of rectum and anus   . Prostate cancer   . Thrombocytopenia, unspecified   . Tobacco use disorder   .  Nephrolithiasis   . Hemorrhage of gastrointestinal tract, unspecified   . HLD (hyperlipidemia)   . CAD (coronary artery disease)     a.  Lexiscan Myoview (02/14/14):  Apical, inferolateral scar; ? Mild ischemia; EF 32%.;  b.  LHC (02/21/14):  dLM 100%, LAD 100%, CFX 100%, dRCA 50%. => CABG (L-LAD, S-OM, S-PDA)  . Ischemic cardiomyopathy     a. Echo (02/17/14):  EF 35-40%, anteroseptal and apical AK, Gr 2 DD, MAC, mod LAE.  [name] Chronic systolic heart failure   . Atrial fibrillation     a. coumadin;  b. Amiodarone   Past Surgical History  Procedure Laterality Date  . Back surgery    . Cataract extraction  2010  . Coronary artery bypass graft N/A 02/24/2014    Procedure: Coronary artery bypass graft times three using left internal mammary artery and left leg greater saphenous vein harvested endoscopically.;  Surgeon: Gaye Pollack, MD;  Location: [facility] OR;  Service: Open Heart Surgery;  Laterality: N/A;  . Intraoperative transesophageal echocardiogram N/A 02/24/2014    Procedure: INTRAOPERATIVE TRANSESOPHAGEAL ECHOCARDIOGRAM;  Surgeon: Gaye Pollack, MD;  Location: [facility] OR;  Service: Open Heart Surgery;  Laterality: N/A;  . Clipping of atrial appendage Left 02/24/2014    Procedure: CLIPPING OF ATRIAL APPENDAGE;  Surgeon: Gaye Pollack, MD;  Location: [place];  Service: Open Heart Surgery;  Laterality: Left;  . Colonoscopy  2005    Normal   Current Outpatient Prescriptions on File Prior to Visit  Medication Sig Dispense Refill  . amiodarone (PACERONE) 200 MG  tablet Take 1 tablet (200 mg total) by mouth daily. X 1 week, then 200 mg po twice daily  30 tablet  11  . aspirin EC 81 MG tablet Take 1 tablet (81 mg total) by mouth daily.      [name] atorvastatin (LIPITOR) 10 MG tablet Take 1 tablet (10 mg total) by mouth daily.  90 tablet  3  . Calcium Carbonate-Vitamin D 600-200 MG-UNIT TABS Take 1 tablet by mouth daily. For bones       . carvedilol (COREG) 6.25 MG tablet Take 1 tablet (6.25 mg total) by mouth 2  (two) times daily with a meal.  60 tablet  1  . Flaxseed, Linseed, (FLAXSEED OIL PO) Take 1 tablet by mouth daily.       . furosemide (LASIX) 40 MG tablet Take 1 tablet (40 mg total) by mouth daily.  30 tablet  1  . GARLIC PO Take 1 tablet by mouth daily.       [name] lisinopril (PRINIVIL,ZESTRIL) 5 MG tablet Take 1 tablet (5 mg total) by mouth daily.  90 tablet  3  . metFORMIN (GLUCOPHAGE) 1000 MG tablet Take 1,000 mg by mouth 2 (two) times daily with a meal.      . Multiple Vitamin (MULTIVITAMINS PO) Take 1 tablet by mouth daily.        [name] oxyCODONE (OXY IR/ROXICODONE) 5 MG immediate release tablet Take 1-2 tablets (5-10 mg total) by mouth every 3 (three) hours as needed for severe pain.  30 tablet  0  . potassium chloride SA (K-DUR,KLOR-CON) 20 MEQ tablet Take 1 tablet (20 mEq total) by mouth daily.  60 tablet  1  . warfarin (COUMADIN) 5 MG tablet Take 1 tablet (5 mg total) by mouth daily. Or as directed by [facility] Clinic  60 tablet  1   No current facility-administered medications on file prior to visit.   Family History  Problem Relation Age of Onset  . Cancer Mother 23    leukemia  . Heart disease Father   . Heart attack Father 77    Physical exam BP 144/76  Pulse 66  Temp(Src) 98.2 F (36.8 C) (Oral)  Resp 14  Wt 242 lb 12.8 oz (110.133 kg)  SpO2 98%  general- adult, obese male in NAD heent- no pallor, no icterus, MMM, no JVD, no LAD cvs- normal s1, s2, rrr, no murmur respi- b/l CTA, no wheeze/ rhonchi abdo- bowel sounds present, soft, non tender Ext- able to move all 4, no edema Neuro- aaox 3, no focal deficit Psych- behavior appropriate   Labs- Lab Results  Component Value Date   HGBA1C 7.9* 02/24/2014   Lipid Panel     Component Value Date/Time   TRIG 81 12/06/2013 0828   HDL 40 12/06/2013 0828   CHOLHDL 2.6 12/06/2013 0828   LDLCALC 48 12/06/2013 0828   CMP     Component Value Date/Time   NA 140 03/17/2014 0905   NA 143 12/06/2013 0828   K 3.7 03/17/2014  0905   CL 104 03/17/2014 0905   CO2 28 03/17/2014 0905   GLUCOSE 96 03/17/2014 0905   GLUCOSE 177* 12/06/2013 0828   BUN 19 03/17/2014 0905   BUN 23 12/06/2013 0828   CREATININE 1.0 03/17/2014 0905   CALCIUM 9.2 03/17/2014 0905   PROT 7.1 02/23/2014 1646   PROT 6.8 12/06/2013 0828   ALBUMIN 3.5 02/23/2014 1646   AST 12 02/23/2014 1646   ALT 16 02/23/2014 1646   ALKPHOS 47 02/23/2014  1646   BILITOT 0.4 02/23/2014 1646   GFRNONAA 84* 02/27/2014 0242   GFRAA >90 02/27/2014 0242   CBC    Component Value Date/Time   WBC 14.1* 02/27/2014 0242   RBC 3.46* 02/27/2014 0242   HGB 9.7* 02/27/2014 0242   HCT 30.2* 02/27/2014 0242   PLT 102* 02/27/2014 0242   MCV 87.3 02/27/2014 0242   MCH 28.0 02/27/2014 0242   MCHC 32.1 02/27/2014 0242   RDW 14.9 02/27/2014 0242   LYMPHSABS 2.1 02/12/2014 1712   MONOABS 0.9 02/12/2014 1712   EOSABS 0.3 02/12/2014 1712   BASOSABS 0.1 02/12/2014 1712    Assessment/plan  1. Type II or unspecified type diabetes mellitus without mention of complication, uncontrolled Reviewed a1c. Has low fasting reading recently. Will reduce his glipizide to 2.5 mg daily and continue metformin 1000 mg bid. Monitor cbg. Check urine microalbumin today. Continue ACEI and statin. If fasting cbg remains low, will stop glipizide - CMP; Future - Lipid Panel; Future - Hemoglobin A1c; Future - Microalbumin/Creatinine Ratio, Urine  2. Atrial fibrillation Rate controlled today. Continue coreg, amiodarone and coumadin. Follows in cardiology clinic  3. HTN (hypertension) Slightly elevated SBP. Monitor bp at home. Explained importance of having controlled bp. Dietary counselling and exercise encouraged. No med changes made this visit  4. Tobacco user Stopped using it  5. Hyperlipidemia Continue lipitor - Lipid Panel; Future  6. Anemia Likely in setting of blood loss post surgery. Monitor clinically for now. Check cbc in 1 week  7. Leukocytosis On last lab review, has had leukocytosis, likely from stress  response. No signs of infection at present and is afebrile. Will recheck cbc to make sure wbc has normalized

## 2014-03-19 ENCOUNTER — Ambulatory Visit
Admission: RE | Admit: 2014-03-19 | Discharge: 2014-03-19 | Disposition: A | Discharge status: Home / Self Care | Payer: Medicare Other | Source: Ambulatory Visit | Attending: Surgery | Admitting: Surgery

## 2014-03-19 ENCOUNTER — Encounter: Payer: Self-pay | Admitting: Surgery

## 2014-03-19 ENCOUNTER — Ambulatory Visit (INDEPENDENT_AMBULATORY_CARE_PROVIDER_SITE_OTHER): Payer: Self-pay | Admitting: Surgery

## 2014-03-19 VITALS — BP 142/78 | HR 62 | Resp 20 | Ht 70.0 in | Wt 240.0 lb

## 2014-03-19 DIAGNOSIS — I2510 Atherosclerotic heart disease of native coronary artery without angina pectoris: Secondary | ICD-10-CM

## 2014-03-19 DIAGNOSIS — J90 Pleural effusion, not elsewhere classified: Secondary | ICD-10-CM | POA: Diagnosis not present

## 2014-03-19 DIAGNOSIS — J9819 Other pulmonary collapse: Secondary | ICD-10-CM | POA: Diagnosis not present

## 2014-03-19 DIAGNOSIS — Z951 Presence of aortocoronary bypass graft: Secondary | ICD-10-CM

## 2014-03-19 LAB — MICROALBUMIN / CREATININE URINE RATIO
Creatinine, Ur: 71.1 mg/dL (ref 22.0–328.0)
MICROALB/CREAT RATIO: 80.6 mg/g creat — ABNORMAL HIGH (ref 0.0–30.0)
Microalbumin, Urine: 57.3 ug/mL — ABNORMAL HIGH (ref 0.0–17.0)

## 2014-03-19 NOTE — Progress Notes (Signed)
HPI:  Patient returns for routine postoperative follow-up having undergone CABG x 3 and clipping of the LAA on 02/24/2014. The patient's early postoperative recovery while in the hospital was notable for development of postop atrial fibrillation converted on amiodarone. He had been on Xarelto preop but was switched to coumadin at his wife's request due to his prescription coverage. Since hospital discharge the patient reports that he has been ambulating well with no chest pain or shortness of breath. He has been watching his diet and lost 20 lbs so far.   Current Outpatient Prescriptions  Medication Sig Dispense Refill  . amiodarone (PACERONE) 200 MG tablet Take 200 mg by mouth daily.      [name] aspirin EC 81 MG tablet Take 1 tablet (81 mg total) by mouth daily.      [name] atorvastatin (LIPITOR) 10 MG tablet Take 1 tablet (10 mg total) by mouth daily.  90 tablet  3  . Calcium Carbonate-Vitamin D 600-200 MG-UNIT TABS Take 1 tablet by mouth daily. For bones       . carvedilol (COREG) 6.25 MG tablet Take 1 tablet (6.25 mg total) by mouth 2 (two) times daily with a meal.  60 tablet  1  . Flaxseed, Linseed, (FLAXSEED OIL PO) Take 1 tablet by mouth daily.       . furosemide (LASIX) 40 MG tablet Take 1 tablet (40 mg total) by mouth daily.  30 tablet  1  . GARLIC PO Take 1 tablet by mouth daily.       [name] glipiZIDE (GLUCOTROL XL) 2.5 MG 24 hr tablet Take 1 tablet once a day for blood sugar.  30 tablet  3  . lisinopril (PRINIVIL,ZESTRIL) 5 MG tablet Take 1 tablet (5 mg total) by mouth daily.  90 tablet  3  . metFORMIN (GLUCOPHAGE) 1000 MG tablet Take 1,000 mg by mouth 2 (two) times daily with a meal.      . Multiple Vitamin (MULTIVITAMINS PO) Take 1 tablet by mouth daily.        . potassium chloride SA (K-DUR,KLOR-CON) 20 MEQ tablet Take 1 tablet (20 mEq total) by mouth daily.  60 tablet  1  . warfarin (COUMADIN) 5 MG tablet Take 1 tablet (5 mg total) by mouth daily. Or as directed by [facility]mad[facility] Clinic  60  tablet  1  . oxyCODONE (OXY IR/ROXICODONE) 5 MG immediate release tablet Take 1-2 tablets (5-10 mg total) by mouth every 3 (three) hours as needed for severe pain.  30 tablet  0   No current facility-administered medications for this visit.    Physical Exam: BP 142/78  Pulse 62  Resp 20  Ht 5\' 10"  (1.778 m)  Wt 240 lb (108.863 kg)  BMI 34.44 kg/m2  SpO2 97% He looks well. Lung exam shows decreased breath sounds at the left base. Cardiac exam shows a regular rate and rhythm with normal heart sounds. Chest incision is healing well and sternum is stable. The leg incisions are healing well and there is mild peripheral edema in the lower legs.   Diagnostic Tests:  CLINICAL DATA: Followup CABG surgery  EXAM:  CHEST 2 VIEW  COMPARISON: 02/26/2014  FINDINGS:  Changes from recent CABG surgery are again noted. There is a left  atrial appendage occluder. Cardiac silhouette is mildly enlarged.  Normal mediastinal and hilar contours.  Moderate left pleural effusion. This is associated with mild  atelectasis. No right effusion. No pneumothorax. Lungs are otherwise  clear.  Bony thorax is  demineralized but grossly intact.  IMPRESSION:  1. Moderate left pleural effusion with mild associated basilar  atelectasis.  2. No pulmonary edema or pneumothorax. No mediastinal widening. No  right effusion. Left pleural effusion has increased in size from the  prior study.  Electronically Signed  By: Lajean Manes M.D.  On: 03/19/2014 14:42    Impression:  Overall I think he is doing well. I encouraged him to continue walking. He is planning to participate in cardiac rehab. I told him he could drive his car but should not lift anything heavier than 10 lbs for three months postop. He does have a residual left pleural effusion and associated atelectasis. Despite this effusion he is asymptomatic with ambulation. Since he is on coumadin I will follow this for another month to see if it resolves on its  own. He will continue the lasix and work on his IS.   Plan:  I will see him back in one month with a chest xray.

## 2014-03-24 ENCOUNTER — Ambulatory Visit (INDEPENDENT_AMBULATORY_CARE_PROVIDER_SITE_OTHER): Payer: Medicare Other

## 2014-03-24 ENCOUNTER — Other Ambulatory Visit (INDEPENDENT_AMBULATORY_CARE_PROVIDER_SITE_OTHER): Payer: Medicare Other

## 2014-03-24 DIAGNOSIS — I5022 Chronic systolic (congestive) heart failure: Secondary | ICD-10-CM

## 2014-03-24 DIAGNOSIS — I2589 Other forms of chronic ischemic heart disease: Secondary | ICD-10-CM

## 2014-03-24 DIAGNOSIS — I10 Essential (primary) hypertension: Secondary | ICD-10-CM

## 2014-03-24 DIAGNOSIS — Z5181 Encounter for therapeutic drug level monitoring: Secondary | ICD-10-CM | POA: Diagnosis not present

## 2014-03-24 DIAGNOSIS — I4891 Unspecified atrial fibrillation: Secondary | ICD-10-CM | POA: Diagnosis not present

## 2014-03-24 LAB — BASIC METABOLIC PANEL
BUN: 18 mg/dL (ref 6–23)
CO2: 29 mEq/L (ref 19–32)
Calcium: 9.4 mg/dL (ref 8.4–10.5)
Chloride: 101 mEq/L (ref 96–112)
Creatinine, Ser: 1.1 mg/dL (ref 0.4–1.5)
GFR: 73.04 mL/min (ref >60.00)
Glucose, Bld: 133 mg/dL — ABNORMAL HIGH (ref 70–99)
Potassium: 4 mEq/L (ref 3.5–5.1)
Sodium: 139 mEq/L (ref 135–145)

## 2014-03-24 LAB — POCT INR: INR: 2.6

## 2014-04-03 ENCOUNTER — Ambulatory Visit (INDEPENDENT_AMBULATORY_CARE_PROVIDER_SITE_OTHER): Payer: Medicare Other

## 2014-04-03 DIAGNOSIS — I4891 Unspecified atrial fibrillation: Secondary | ICD-10-CM | POA: Diagnosis not present

## 2014-04-03 DIAGNOSIS — Z5181 Encounter for therapeutic drug level monitoring: Secondary | ICD-10-CM

## 2014-04-03 LAB — POCT INR: INR: 2.3

## 2014-04-08 ENCOUNTER — Ambulatory Visit: Payer: Medicare Other | Admitting: Cardiovascular Disease

## 2014-04-10 ENCOUNTER — Ambulatory Visit (HOSPITAL_COMMUNITY): Payer: Medicare Other

## 2014-04-14 ENCOUNTER — Encounter (HOSPITAL_COMMUNITY): Payer: Medicare Other

## 2014-04-16 ENCOUNTER — Encounter (HOSPITAL_COMMUNITY): Payer: Medicare Other

## 2014-04-17 ENCOUNTER — Ambulatory Visit (INDEPENDENT_AMBULATORY_CARE_PROVIDER_SITE_OTHER): Payer: Medicare Other | Admitting: *Deleted

## 2014-04-17 ENCOUNTER — Other Ambulatory Visit: Payer: Self-pay | Admitting: Surgery

## 2014-04-17 DIAGNOSIS — I200 Unstable angina: Secondary | ICD-10-CM

## 2014-04-17 DIAGNOSIS — Z5181 Encounter for therapeutic drug level monitoring: Secondary | ICD-10-CM | POA: Diagnosis not present

## 2014-04-17 DIAGNOSIS — I4891 Unspecified atrial fibrillation: Secondary | ICD-10-CM | POA: Diagnosis not present

## 2014-04-17 LAB — POCT INR: INR: 2.1

## 2014-04-18 ENCOUNTER — Encounter (HOSPITAL_COMMUNITY): Payer: Medicare Other

## 2014-04-21 ENCOUNTER — Other Ambulatory Visit: Payer: Self-pay | Admitting: Physician Assistant

## 2014-04-21 ENCOUNTER — Encounter (HOSPITAL_COMMUNITY): Payer: Medicare Other

## 2014-04-23 ENCOUNTER — Ambulatory Visit
Admission: RE | Admit: 2014-04-23 | Discharge: 2014-04-23 | Disposition: A | Discharge status: Home / Self Care | Payer: Medicare Other | Source: Ambulatory Visit | Attending: Surgery | Admitting: Surgery

## 2014-04-23 ENCOUNTER — Encounter (HOSPITAL_COMMUNITY): Payer: Medicare Other

## 2014-04-23 ENCOUNTER — Encounter: Payer: Self-pay | Admitting: Surgery

## 2014-04-23 ENCOUNTER — Ambulatory Visit (INDEPENDENT_AMBULATORY_CARE_PROVIDER_SITE_OTHER): Payer: Self-pay | Admitting: Surgery

## 2014-04-23 VITALS — BP 155/83 | HR 75 | Resp 20 | Ht 70.0 in | Wt 240.0 lb

## 2014-04-23 DIAGNOSIS — I200 Unstable angina: Secondary | ICD-10-CM

## 2014-04-23 DIAGNOSIS — Z951 Presence of aortocoronary bypass graft: Secondary | ICD-10-CM

## 2014-04-23 DIAGNOSIS — I2510 Atherosclerotic heart disease of native coronary artery without angina pectoris: Secondary | ICD-10-CM

## 2014-04-23 DIAGNOSIS — R9389 Abnormal findings on diagnostic imaging of other specified body structures: Secondary | ICD-10-CM | POA: Diagnosis not present

## 2014-04-23 NOTE — Progress Notes (Signed)
     HPI:  Patient returns for routine postoperative follow-up having undergone CABG x 3 and clipping of the LAA on 02/24/2014. I saw him on 03/19/2014 and he was recovering well. His CXR showed a moderate left pleural effusion that was larger than on his previous xray. He continues to feel well with no chest pain or shortness of breath.   Current Outpatient Prescriptions  Medication Sig Dispense Refill  . amiodarone (PACERONE) 200 MG tablet Take 200 mg by mouth daily.      [name] aspirin EC 81 MG tablet Take 1 tablet (81 mg total) by mouth daily.      [name] atorvastatin (LIPITOR) 10 MG tablet Take 1 tablet (10 mg total) by mouth daily.  90 tablet  3  . Calcium Carbonate-Vitamin D 600-200 MG-UNIT TABS Take 1 tablet by mouth daily. For bones       . carvedilol (COREG) 6.25 MG tablet Take 1 tablet (6.25 mg total) by mouth 2 (two) times daily with a meal.  60 tablet  1  . Flaxseed, Linseed, (FLAXSEED OIL PO) Take 1 tablet by mouth daily.       . furosemide (LASIX) 40 MG tablet Take 1 tablet (40 mg total) by mouth daily.  30 tablet  1  . GARLIC PO Take 1 tablet by mouth daily.       [name] glipiZIDE (GLUCOTROL XL) 2.5 MG 24 hr tablet Take 1 tablet once a day for blood sugar.  30 tablet  3  . lisinopril (PRINIVIL,ZESTRIL) 5 MG tablet Take 1 tablet (5 mg total) by mouth daily.  90 tablet  3  . metFORMIN (GLUCOPHAGE) 1000 MG tablet Take 1,000 mg by mouth 2 (two) times daily with a meal.      . Multiple Vitamin (MULTIVITAMINS PO) Take 1 tablet by mouth daily.        . potassium chloride SA (K-DUR,KLOR-CON) 20 MEQ tablet Take 1 tablet (20 mEq total) by mouth daily.  60 tablet  1  . warfarin (COUMADIN) 5 MG tablet Take 1 tablet (5 mg total) by mouth daily. Or as directed by [facility] Clinic  60 tablet  1   No current facility-administered medications for this visit.     Physical Exam: BP 155/83  Pulse 75  Resp 20  Ht 5\' 10"  (1.778 m)  Wt 240 lb (108.863 kg)  BMI 34.44 kg/m2  SpO2 96% He looks well Lung  exam is clear Cardiac exam shows a regular rate and rhythm.  Diagnostic Tests:  CLINICAL DATA: Prior surgery, no complaints, intermediate coronary  syndrome  EXAM:  CHEST 2 VIEW  COMPARISON: DG CHEST 2 VIEW dated 03/19/2014  FINDINGS:  Is bilateral mild chronic interstitial thickening. There is no focal  parenchymal opacity, pleural effusion, or pneumothorax. The heart  and mediastinal contours are unremarkable. Prior median sternotomy.  There is mild thoracic spine spondylosis.  IMPRESSION:  No active cardiopulmonary disease.  Electronically Signed  By: Kathreen Devoid  On: 04/23/2014 09:28    Impression:  He is doing well. The left pleural effusion has resolved.  Plan:  He will continue to follow up with cardiology.

## 2014-04-24 ENCOUNTER — Encounter (HOSPITAL_COMMUNITY)
Admission: RE | Admit: 2014-04-24 | Discharge: 2014-04-24 | Disposition: A | Discharge status: Home / Self Care | Payer: Medicare Other | Source: Ambulatory Visit | Attending: Cardiovascular Disease | Admitting: Cardiovascular Disease

## 2014-04-24 NOTE — Progress Notes (Signed)
Cardiac Rehab Medication Review by a Pharmacist  Does the patient  feel that his/her medications are working for him/her?  yes  Has the patient been experiencing any side effects to the medications prescribed?  no  Does the patient measure his/her own blood pressure or blood glucose at home?  yes takes both daily  Does the patient have any problems obtaining medications due to transportation or finances?   no  Understanding of regimen: good Understanding of indications: good Potential of compliance: good    Pharmacist comments: wife is an Therapist, sports and assists with medications    Seth [name] 04/24/2014 8:13 AM

## 2014-04-25 ENCOUNTER — Encounter (HOSPITAL_COMMUNITY): Payer: Medicare Other

## 2014-04-28 ENCOUNTER — Ambulatory Visit (INDEPENDENT_AMBULATORY_CARE_PROVIDER_SITE_OTHER): Payer: Medicare Other | Admitting: Cardiovascular Disease

## 2014-04-28 ENCOUNTER — Encounter: Payer: Self-pay | Admitting: Cardiovascular Disease

## 2014-04-28 ENCOUNTER — Encounter (HOSPITAL_COMMUNITY): Payer: Medicare Other

## 2014-04-28 VITALS — BP 140/80 | HR 70 | Ht 68.0 in | Wt 240.0 lb

## 2014-04-28 DIAGNOSIS — I4891 Unspecified atrial fibrillation: Secondary | ICD-10-CM | POA: Diagnosis not present

## 2014-04-28 DIAGNOSIS — I255 Ischemic cardiomyopathy: Secondary | ICD-10-CM

## 2014-04-28 DIAGNOSIS — I5022 Chronic systolic (congestive) heart failure: Secondary | ICD-10-CM | POA: Diagnosis not present

## 2014-04-28 DIAGNOSIS — I10 Essential (primary) hypertension: Secondary | ICD-10-CM

## 2014-04-28 DIAGNOSIS — I2510 Atherosclerotic heart disease of native coronary artery without angina pectoris: Secondary | ICD-10-CM

## 2014-04-28 DIAGNOSIS — E785 Hyperlipidemia, unspecified: Secondary | ICD-10-CM

## 2014-04-28 DIAGNOSIS — I2589 Other forms of chronic ischemic heart disease: Secondary | ICD-10-CM

## 2014-04-28 DIAGNOSIS — I200 Unstable angina: Secondary | ICD-10-CM

## 2014-04-28 MED ORDER — CARVEDILOL 6.25 MG PO TABS: 6.2500 mg | ORAL_TABLET | Freq: Two times a day (BID) | ORAL | Status: DC

## 2014-04-28 MED ORDER — LISINOPRIL 10 MG PO TABS: 10.0000 mg | ORAL_TABLET | Freq: Every day | ORAL | Status: DC

## 2014-04-28 NOTE — Progress Notes (Signed)
History of Present Illness: 73 y.o. male with a hx of DM, HTN, HLD, CAD, prostate CA, renal insufficiency, obesity here today for cardiac follow up. He was seen as a new patient 02/12/14 for evaluation of atrial fib. He was placed on Xarelto. He was set up for echo and stress test. Stress test was abnormal with apical and inferolateral scar and ischemia, LVEF of 32%. Echo 02/17/14 with LVEF=35-40% with akinesis of the mid-distalanteroseptal and apical myocardium, MAC, dilated LA. Cardiac cath 02/21/14 with occluded left main, moderate RCA stenosis with the entire left system filling from right to left collaterals. 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 He was discharged on amiodarone and coumadin.   He is here today for follow up. He tells me that he feels great. No chest pain or SOB. He has been active.   Primary Care Physician: Bubba Camp  Last Lipid Profile:Lipid Panel     Component Value Date/Time   TRIG 81 12/06/2013 0828   HDL 40 12/06/2013 0828   CHOLHDL 2.6 12/06/2013 0828   LDLCALC 48 12/06/2013 0828     Past Medical History  Diagnosis Date  . Impotence of organic origin   . CKD (chronic kidney disease)     proteinuria  . Type II or unspecified type diabetes mellitus with renal manifestations, uncontrolled   . Peripheral vascular disease, unspecified   . HTN (hypertension)   . Hemorrhage of rectum and anus   . Prostate cancer   . Thrombocytopenia, unspecified   . Tobacco use disorder   . Nephrolithiasis   . Hemorrhage of gastrointestinal tract, unspecified   . HLD (hyperlipidemia)   . CAD (coronary artery disease)     a.  Lexiscan Myoview (02/14/14):  Apical, inferolateral scar; ? Mild ischemia; EF 32%.;  b.  LHC (02/21/14):  dLM 100%, LAD 100%, CFX 100%, dRCA 50%. => CABG (L-LAD, S-OM, S-PDA)  . Ischemic cardiomyopathy     a. Echo (02/17/14):  EF 35-40%, anteroseptal and apical AK, Gr 2 DD, MAC, mod LAE.  [name] Chronic systolic heart failure   .  Atrial fibrillation     a. coumadin;  b. Amiodarone    Past Surgical History  Procedure Laterality Date  . Back surgery    . Cataract extraction  2010  . Coronary artery bypass graft N/A 02/24/2014    Procedure: Coronary artery bypass graft times three using left internal mammary artery and left leg greater saphenous vein harvested endoscopically.;  Surgeon: Gaye Pollack, MD;  Location: [facility] OR;  Service: Open Heart Surgery;  Laterality: N/A;  . Intraoperative transesophageal echocardiogram N/A 02/24/2014    Procedure: INTRAOPERATIVE TRANSESOPHAGEAL ECHOCARDIOGRAM;  Surgeon: Gaye Pollack, MD;  Location: [facility] OR;  Service: Open Heart Surgery;  Laterality: N/A;  . Clipping of atrial appendage Left 02/24/2014    Procedure: CLIPPING OF ATRIAL APPENDAGE;  Surgeon: Gaye Pollack, MD;  Location: [place];  Service: Open Heart Surgery;  Laterality: Left;  . Colonoscopy  2005    Normal    Current Outpatient Prescriptions  Medication Sig Dispense Refill  . amiodarone (PACERONE) 200 MG tablet Take 200 mg by mouth daily.      [name] aspirin EC 81 MG tablet Take 1 tablet (81 mg total) by mouth daily.      [name] atorvastatin (LIPITOR) 10 MG tablet Take 1 tablet (10 mg total) by mouth daily.  90 tablet  3  . Calcium Carbonate-Vitamin D 600-200 MG-UNIT  TABS Take 1 tablet by mouth daily. For bones       . carvedilol (COREG) 6.25 MG tablet Take 1 tablet (6.25 mg total) by mouth 2 (two) times daily with a meal.  60 tablet  1  . Flaxseed, Linseed, (FLAXSEED OIL PO) Take 1 tablet by mouth daily.       . furosemide (LASIX) 40 MG tablet Take 1 tablet (40 mg total) by mouth daily.  30 tablet  1  . GARLIC PO Take 1 tablet by mouth daily.       [name] glipiZIDE (GLUCOTROL XL) 2.5 MG 24 hr tablet Take 1 tablet once a day for blood sugar.  30 tablet  3  . lisinopril (PRINIVIL,ZESTRIL) 5 MG tablet Take 1 tablet (5 mg total) by mouth daily.  90 tablet  3  . metFORMIN (GLUCOPHAGE) 1000 MG tablet Take 1,000 mg by mouth 2 (two) times daily  with a meal.      . Multiple Vitamin (MULTIVITAMINS PO) Take 1 tablet by mouth daily.        . potassium chloride SA (K-DUR,KLOR-CON) 20 MEQ tablet Take 1 tablet (20 mEq total) by mouth daily.  60 tablet  1  . warfarin (COUMADIN) 5 MG tablet Take 1 tablet (5 mg total) by mouth daily. Or as directed by [facility]  60 tablet  1   No current facility-administered medications for this visit.    No Known Allergies  History   Social History  . Marital Status: Married    Spouse Name: N/A    Number of Children: 3  . Years of Education: N/A   Occupational History  . Sell storage buildings    Social History Main Topics  . Smoking status: Never Smoker   . Smokeless tobacco: Current User    Types: Chew  . Alcohol Use: No  . Drug Use: No  . Sexual Activity: Not on file   Other Topics Concern  . Not on file   Social History Narrative  . No narrative on file    Family History  Problem Relation Age of Onset  . Cancer Mother 31    leukemia  . Heart disease Father   . Heart attack Father 70    Review of Systems:  As stated in the HPI and otherwise negative.   BP 140/80  Pulse 70  Ht 5\' 8"  (1.727 m)  Wt 240 lb (108.863 kg)  BMI 36.50 kg/m2  Physical Examination: General: Well developed, well nourished, NAD HEENT: OP clear, mucus membranes moist SKIN: warm, dry. No rashes. Neuro: No focal deficits Musculoskeletal: Muscle strength 5/5 all ext Psychiatric: Mood and affect normal Neck: No JVD, no carotid bruits, no thyromegaly, no lymphadenopathy. Lungs:Clear bilaterally, no wheezes, rhonci, crackles Cardiovascular: Regular rate and rhythm. No murmurs, gallops or rubs. Abdomen:Soft. Bowel sounds present. Non-tender.  Extremities: No lower extremity edema. Pulses are 2 + in the bilateral DP/PT.  EKG: NSR, rate 70 bpm. Non-specific ST abnormalities.   Echo 02/17/14: Left ventricle: The cavity size was normal. Wall thickness was normal. Systolic function was moderately  reduced. The estimated ejection fraction was in the range of 35% to 40%. There is akinesis of the mid-distalanteroseptal and apical myocardium. Features are consistent with a pseudonormal left ventricular filling pattern, with concomitant abnormal relaxation and increased filling pressure (grade 2 diastolic dysfunction). Doppler parameters are consistent with high ventricular filling pressure. - Mitral valve: Calcified annulus. - Left atrium: The atrium was moderately dilated.  Assessment and Plan:  1. CAD: s/p CABG. Stable. Continue current meds. He is starting cardiac rehab.   2. Atrial fibrillation: Sinus today. Continue amiodarone and coumadin.   3. Ischemic cardiomyopathy: LVEF=35-40% by pre CABG echo. Will repeat echo May 28, 2014.    4. Chronic systolic CHF: Volume status is ok. Continue Lasix.   5. HTN: BP elevated this am and at home. Will increase Lisinopril to 10 mg once daily. Continue other meds.   6. HLD: Lipids controlled. Continue statin.

## 2014-04-28 NOTE — Patient Instructions (Addendum)
Your physician recommends that you schedule a follow-up appointment in:  3 months. --Scheduled for July 31, 2014 at 8:45  Your physician has recommended you make the following change in your medication:   Increase Lisinopril to 10 mg by mouth daily.  Your physician has requested that you have an echocardiogram. Echocardiography is a painless test that uses sound waves to create images of your heart. It provides your doctor with information about the size and shape of your heart and how well your heart's chambers and valves are working. This procedure takes approximately one hour. There are no restrictions for this procedure. This is scheduled for May 28, 2014 at 9:30

## 2014-04-30 ENCOUNTER — Encounter (HOSPITAL_COMMUNITY)
Admission: RE | Admit: 2014-04-30 | Discharge: 2014-04-30 | Disposition: A | Discharge status: Home / Self Care | Payer: Medicare Other | Source: Ambulatory Visit | Attending: Cardiovascular Disease | Admitting: Cardiovascular Disease

## 2014-04-30 ENCOUNTER — Encounter (HOSPITAL_COMMUNITY): Payer: Medicare Other

## 2014-04-30 DIAGNOSIS — Z951 Presence of aortocoronary bypass graft: Secondary | ICD-10-CM | POA: Diagnosis not present

## 2014-04-30 LAB — GLUCOSE, CAPILLARY
Glucose-Capillary: 185 mg/dL — ABNORMAL HIGH (ref 70–99)
Glucose-Capillary: 158 mg/dL — ABNORMAL HIGH (ref 70–99)

## 2014-04-30 NOTE — Progress Notes (Signed)
Pt started cardiac rehab today.  Pt tolerated light exercise without difficulty. Telemetry rhythm Sinus. Vital signs stable. Will continue to monitor the patient throughout  the program. Konrad Dolores will be absent from exercise the next two sessions he is going out of town

## 2014-05-02 ENCOUNTER — Encounter (HOSPITAL_COMMUNITY): Payer: Medicare Other

## 2014-05-05 ENCOUNTER — Encounter (HOSPITAL_COMMUNITY): Payer: Medicare Other

## 2014-05-07 ENCOUNTER — Encounter (HOSPITAL_COMMUNITY)
Admission: RE | Admit: 2014-05-07 | Discharge: 2014-05-07 | Disposition: A | Discharge status: Home / Self Care | Payer: Medicare Other | Source: Ambulatory Visit | Attending: Cardiovascular Disease | Admitting: Cardiovascular Disease

## 2014-05-07 ENCOUNTER — Encounter (HOSPITAL_COMMUNITY): Payer: Medicare Other

## 2014-05-07 LAB — GLUCOSE, CAPILLARY
Glucose-Capillary: 180 mg/dL — ABNORMAL HIGH (ref 70–99)
Glucose-Capillary: 129 mg/dL — ABNORMAL HIGH (ref 70–99)

## 2014-05-09 ENCOUNTER — Telehealth: Payer: Self-pay | Admitting: *Deleted

## 2014-05-09 ENCOUNTER — Ambulatory Visit (INDEPENDENT_AMBULATORY_CARE_PROVIDER_SITE_OTHER): Payer: Medicare Other | Admitting: *Deleted

## 2014-05-09 ENCOUNTER — Encounter (HOSPITAL_COMMUNITY)
Admission: RE | Admit: 2014-05-09 | Discharge: 2014-05-09 | Disposition: A | Discharge status: Home / Self Care | Payer: Medicare Other | Source: Ambulatory Visit | Attending: Cardiovascular Disease | Admitting: Cardiovascular Disease

## 2014-05-09 ENCOUNTER — Encounter: Payer: Self-pay | Admitting: Cardiovascular Disease

## 2014-05-09 ENCOUNTER — Encounter (HOSPITAL_COMMUNITY): Payer: Medicare Other

## 2014-05-09 DIAGNOSIS — I4891 Unspecified atrial fibrillation: Secondary | ICD-10-CM | POA: Diagnosis not present

## 2014-05-09 DIAGNOSIS — Z5181 Encounter for therapeutic drug level monitoring: Secondary | ICD-10-CM

## 2014-05-09 LAB — GLUCOSE, CAPILLARY
Glucose-Capillary: 149 mg/dL — ABNORMAL HIGH (ref 70–99)
Glucose-Capillary: 120 mg/dL — ABNORMAL HIGH (ref 70–99)

## 2014-05-09 LAB — POCT INR: INR: 2.2

## 2014-05-09 NOTE — Telephone Encounter (Signed)
Telephoned pt and informed him that Dr Angelena Form has approved him to stop coumadin for 5 days prior to his extractions ( See letter) Pt instructed to call us when date is scheduled for extractions. He verbalized understanding.

## 2014-05-09 NOTE — Progress Notes (Signed)
Reviewed home exercise with pt today.  Pt plans to walk and use treadmill at home for exercise.  Reviewed THR, pulse, RPE, sign and symptoms, and when to call 911 or MD.  Pt voiced understanding. Rosangela Fehrenbach, MA, ACSM RCEP  

## 2014-05-12 ENCOUNTER — Encounter (HOSPITAL_COMMUNITY)
Admission: RE | Admit: 2014-05-12 | Discharge: 2014-05-12 | Disposition: A | Discharge status: Home / Self Care | Payer: Medicare Other | Source: Ambulatory Visit | Attending: Cardiovascular Disease | Admitting: Cardiovascular Disease

## 2014-05-12 ENCOUNTER — Encounter (HOSPITAL_COMMUNITY): Payer: Medicare Other

## 2014-05-12 LAB — GLUCOSE, CAPILLARY: Glucose-Capillary: 128 mg/dL — ABNORMAL HIGH (ref 70–99)

## 2014-05-14 ENCOUNTER — Telehealth: Payer: Self-pay | Admitting: Cardiovascular Disease

## 2014-05-14 ENCOUNTER — Encounter (HOSPITAL_COMMUNITY)
Admission: RE | Admit: 2014-05-14 | Discharge: 2014-05-14 | Disposition: A | Discharge status: Home / Self Care | Payer: Medicare Other | Source: Ambulatory Visit | Attending: Cardiovascular Disease | Admitting: Cardiovascular Disease

## 2014-05-14 ENCOUNTER — Encounter (HOSPITAL_COMMUNITY): Payer: Medicare Other

## 2014-05-14 NOTE — Telephone Encounter (Signed)
Ok to hold coumadin for procedure. Darlina Guys

## 2014-05-14 NOTE — Telephone Encounter (Signed)
New Message:   They are requesting the pt stop his coumadin 5 days prior to having a tooth extraction. And that he continue his coumadin the day after...  Fax [phone]

## 2014-05-14 NOTE — Telephone Encounter (Signed)
Will fax this note and signed request to hold coumadin to Dr. Lurlean Nanny.

## 2014-05-15 LAB — GLUCOSE, CAPILLARY: Glucose-Capillary: 187 mg/dL — ABNORMAL HIGH (ref 70–99)

## 2014-05-16 ENCOUNTER — Encounter (HOSPITAL_COMMUNITY): Payer: Medicare Other

## 2014-05-16 ENCOUNTER — Encounter (HOSPITAL_COMMUNITY)
Admission: RE | Admit: 2014-05-16 | Discharge: 2014-05-16 | Disposition: A | Discharge status: Home / Self Care | Payer: Medicare Other | Source: Ambulatory Visit | Attending: Cardiovascular Disease | Admitting: Cardiovascular Disease

## 2014-05-16 LAB — GLUCOSE, CAPILLARY: Glucose-Capillary: 171 mg/dL — ABNORMAL HIGH (ref 70–99)

## 2014-05-16 NOTE — Progress Notes (Signed)
Seth [name] 73 y.o. male Nutrition Note Spoke with pt.  Nutrition Plan and Nutrition Survey goals reviewed with pt. Pt is following Step 2 of the Therapeutic Lifestyle Changes diet according to MEDFICTS results. Per discussion with pt, pt eats out for dinner most days "because my wife and I both work and we don't come home until 6 or 7 pm." Strategies for eating at home more frequently discussed. Pt in the pre-contemplative state of change re: eating out. Pt wants to lose wt. Wt loss tips reviewed. Pt is on Coumadin. Pt states he was unaware of the need to follow a diet with consistent amounts of Vitamin K. Pt educated re: the need for Consistent Vitamin K intake. Pt is diabetic. Last A1c indicates blood glucose not well-controlled. Pt was unaware of A1c level stating "my wife's a nurse and she takes care of all of that." Pt reports checking fasting CBG's daily and CBG's "are in the 90's." Pt expressed understanding of the information reviewed. Pt aware of nutrition education classes offered and plans on attending nutrition classes.  Nutrition Diagnosis   Food-and nutrition-related knowledge deficit related to lack of exposure to information as related to diagnosis of: ? CVD ? DM (A1c 7.9)   Obesity related to excessive energy intake as evidenced by a BMI of 36.6  Nutrition RX/ Estimated Daily Nutrition Needs for: wt loss  1750-2250 Kcal, 45-60 gm fat, 45-60 gm sat fat, 1.7-2.2 gm trans-fat, <1500 mg sodium, 250 gm CHO   Nutrition Intervention   Pt's individual nutrition plan reviewed with pt.   Benefits of adopting Therapeutic Lifestyle Changes discussed when Medficts reviewed.   Pt to attend the Portion Distortion class   Pt to attend the  ? Nutrition I class                     ? Nutrition II class        ? Diabetes Blitz class       ? Diabetes Q & A class   Pt given handouts for: ? Nutrition I class ? Nutrition II class ? Consistent vit K diet  ? Diabetes Blitz class   Continue  client-centered nutrition education by RD, as part of interdisciplinary care. Goal(s)   Pt to identify food quantities necessary to achieve: ? wt loss to a goal wt of 216-234 lb (98.2-106.4 kg) at graduation from cardiac rehab.    Pt to describe the benefit of including fruits, vegetables, whole grains, and low-fat dairy products in a heart healthy meal plan.   CBG concentrations in the normal range or as close to normal as is safely possible.   Pt able to name foods rich in vitamin K. (Pt taking Coumadin/Warfarin). Monitor and Evaluate progress toward nutrition goal with team. Nutrition Risk:  Change to Moderate [name], M.Ed, RD, LDN, CDE 05/16/2014 10:28 AM

## 2014-05-21 ENCOUNTER — Encounter (HOSPITAL_COMMUNITY)
Admission: RE | Admit: 2014-05-21 | Discharge: 2014-05-21 | Disposition: A | Discharge status: Home / Self Care | Payer: Medicare Other | Source: Ambulatory Visit | Attending: Cardiovascular Disease | Admitting: Cardiovascular Disease

## 2014-05-21 ENCOUNTER — Encounter (HOSPITAL_COMMUNITY): Payer: Medicare Other

## 2014-05-21 LAB — GLUCOSE, CAPILLARY: Glucose-Capillary: 193 mg/dL — ABNORMAL HIGH (ref 70–99)

## 2014-05-23 ENCOUNTER — Other Ambulatory Visit: Payer: Medicare Other

## 2014-05-23 ENCOUNTER — Encounter (HOSPITAL_COMMUNITY): Payer: Medicare Other

## 2014-05-23 ENCOUNTER — Encounter (HOSPITAL_COMMUNITY)
Admission: RE | Admit: 2014-05-23 | Discharge: 2014-05-23 | Disposition: A | Discharge status: Home / Self Care | Payer: Medicare Other | Source: Ambulatory Visit | Attending: Cardiovascular Disease | Admitting: Cardiovascular Disease

## 2014-05-23 DIAGNOSIS — IMO0001 Reserved for inherently not codable concepts without codable children: Secondary | ICD-10-CM

## 2014-05-23 DIAGNOSIS — E785 Hyperlipidemia, unspecified: Secondary | ICD-10-CM | POA: Diagnosis not present

## 2014-05-23 DIAGNOSIS — E1165 Type 2 diabetes mellitus with hyperglycemia: Principal | ICD-10-CM

## 2014-05-23 LAB — GLUCOSE, CAPILLARY: Glucose-Capillary: 196 mg/dL — ABNORMAL HIGH (ref 70–99)

## 2014-05-24 LAB — COMPREHENSIVE METABOLIC PANEL
ALT: 16 IU/L (ref 0–44)
AST: 14 IU/L (ref 0–40)
Albumin/Globulin Ratio: 1.5 (ref 1.1–2.5)
Albumin: 4 g/dL (ref 3.5–4.8)
Alkaline Phosphatase: 58 IU/L (ref 39–117)
BUN/Creatinine Ratio: 19 (ref 10–22)
BUN: 17 mg/dL (ref 8–27)
CO2: 26 mmol/L (ref 18–29)
Calcium: 9.2 mg/dL (ref 8.6–10.2)
Chloride: 105 mmol/L (ref 97–108)
Creatinine, Ser: 0.89 mg/dL (ref 0.76–1.27)
GFR calc Af Amer: 99 mL/min/{1.73_m2} (ref >59)
GFR calc non Af Amer: 86 mL/min/{1.73_m2} (ref >59)
Globulin, Total: 2.7 g/dL (ref 1.5–4.5)
Glucose: 122 mg/dL — ABNORMAL HIGH (ref 65–99)
Potassium: 4.9 mmol/L (ref 3.5–5.2)
Sodium: 145 mmol/L — ABNORMAL HIGH (ref 134–144)
Total Bilirubin: 0.5 mg/dL (ref 0.0–1.2)
Total Protein: 6.7 g/dL (ref 6.0–8.5)

## 2014-05-24 LAB — HEMOGLOBIN A1C
Est. average glucose Bld gHb Est-mCnc: 137 mg/dL
Hgb A1c MFr Bld: 6.4 % — ABNORMAL HIGH (ref 4.8–5.6)

## 2014-05-24 LAB — LIPID PANEL
Chol/HDL Ratio: 2.2 ratio units (ref 0.0–5.0)
Cholesterol, Total: 101 mg/dL (ref 100–199)
HDL: 45 mg/dL (ref >39)
LDL Calculated: 40 mg/dL (ref 0–99)
Triglycerides: 80 mg/dL (ref 0–149)
VLDL Cholesterol Cal: 16 mg/dL (ref 5–40)

## 2014-05-26 ENCOUNTER — Encounter (HOSPITAL_COMMUNITY): Payer: Medicare Other

## 2014-05-26 ENCOUNTER — Encounter (HOSPITAL_COMMUNITY)
Admission: RE | Admit: 2014-05-26 | Discharge: 2014-05-26 | Disposition: A | Discharge status: Home / Self Care | Payer: Medicare Other | Source: Ambulatory Visit | Attending: Cardiovascular Disease | Admitting: Cardiovascular Disease

## 2014-05-26 DIAGNOSIS — Z951 Presence of aortocoronary bypass graft: Secondary | ICD-10-CM | POA: Insufficient documentation

## 2014-05-26 LAB — GLUCOSE, CAPILLARY: Glucose-Capillary: 168 mg/dL — ABNORMAL HIGH (ref 70–99)

## 2014-05-27 ENCOUNTER — Encounter: Payer: Self-pay | Admitting: Internal Medicine

## 2014-05-27 ENCOUNTER — Ambulatory Visit (INDEPENDENT_AMBULATORY_CARE_PROVIDER_SITE_OTHER): Payer: Medicare Other | Admitting: Internal Medicine

## 2014-05-27 VITALS — BP 148/80 | HR 72 | Temp 98.0°F | Resp 10 | Wt 241.0 lb

## 2014-05-27 DIAGNOSIS — I200 Unstable angina: Secondary | ICD-10-CM

## 2014-05-27 DIAGNOSIS — E1129 Type 2 diabetes mellitus with other diabetic kidney complication: Secondary | ICD-10-CM | POA: Diagnosis not present

## 2014-05-27 DIAGNOSIS — I4891 Unspecified atrial fibrillation: Secondary | ICD-10-CM | POA: Diagnosis not present

## 2014-05-27 DIAGNOSIS — I2510 Atherosclerotic heart disease of native coronary artery without angina pectoris: Secondary | ICD-10-CM | POA: Diagnosis not present

## 2014-05-27 DIAGNOSIS — E1159 Type 2 diabetes mellitus with other circulatory complications: Secondary | ICD-10-CM | POA: Diagnosis not present

## 2014-05-27 DIAGNOSIS — E876 Hypokalemia: Secondary | ICD-10-CM

## 2014-05-27 DIAGNOSIS — Z79899 Other long term (current) drug therapy: Secondary | ICD-10-CM

## 2014-05-27 DIAGNOSIS — I798 Other disorders of arteries, arterioles and capillaries in diseases classified elsewhere: Secondary | ICD-10-CM

## 2014-05-27 DIAGNOSIS — E785 Hyperlipidemia, unspecified: Secondary | ICD-10-CM

## 2014-05-27 MED ORDER — POTASSIUM CHLORIDE CRYS ER 10 MEQ PO TBCR: 10.0000 meq | EXTENDED_RELEASE_TABLET | Freq: Every day | ORAL | Status: DC

## 2014-05-27 NOTE — Patient Instructions (Signed)
Stop taking your Glipizide  Check blood sugar twice a week  Take half a tablet of your potassium tablet for now

## 2014-05-27 NOTE — Progress Notes (Signed)
Patient ID: Seth [name], male   DOB: [birth year], 73 y.o.   MRN: [id number]    Chief Complaint  Patient presents with  . Medical Management of Chronic Issues    3 month follow-up, discuss labs (copy printed)    No Known Allergies  HPI 73 y/o male patient is here for follow up visit. He had CAD, afib, dm with renal and vascular manifestations, HTN among others. He is undegoing cardiac rehab at present. Reviewed cardiology notes. compliant with his medications. Coumadin currently on hold with him going for dental surgery on Thursday. No concerns this visit.   His cbg at home have been between 80-150 at rehab centre as per patient.   Review of Systems  Constitutional: Negative for fever, chills, malaise/fatigue and diaphoresis.  HENT: Negative for congestion, hearing loss and sore throat.   Eyes: Negative for blurred vision, double vision and discharge.  Respiratory: Negative for cough, sputum production, shortness of breath and wheezing.   Cardiovascular: Negative for chest pain, palpitations, orthopnea and leg swelling.  Gastrointestinal: Negative for heartburn, nausea, vomiting, abdominal pain, diarrhea and constipation.  Genitourinary: Negative for dysuria, urgency, frequency and flank pain.  Musculoskeletal: Negative for back pain, falls, joint pain and myalgias.  Neurological: Negative for dizziness, tingling, focal weakness and headaches.  Psychiatric/Behavioral: Negative for depression and memory loss. The patient is not nervous/anxious.      Past Medical History  Diagnosis Date  . Impotence of organic origin   . CKD (chronic kidney disease)     proteinuria  . Type II or unspecified type diabetes mellitus with renal manifestations, uncontrolled   . Peripheral vascular disease, unspecified   . HTN (hypertension)   . Hemorrhage of rectum and anus   . Prostate cancer   . Thrombocytopenia, unspecified   . Tobacco use disorder   . Nephrolithiasis   . Hemorrhage of  gastrointestinal tract, unspecified   . HLD (hyperlipidemia)   . CAD (coronary artery disease)     a.  Lexiscan Myoview (02/14/14):  Apical, inferolateral scar; ? Mild ischemia; EF 32%.;  b.  LHC (02/21/14):  dLM 100%, LAD 100%, CFX 100%, dRCA 50%. => CABG (L-LAD, S-OM, S-PDA)  . Ischemic cardiomyopathy     a. Echo (02/17/14):  EF 35-40%, anteroseptal and apical AK, Gr 2 DD, MAC, mod LAE.  [name] Chronic systolic heart failure   . Atrial fibrillation     a. coumadin;  b. Amiodarone   Medication reviewed. See [facility]  Physical exam BP 148/80  Pulse 72  Temp(Src) 98 F (36.7 C) (Oral)  Resp 10  Wt 241 lb (109.317 kg)  SpO2 98%  General- elderly male in no acute distress, overweight Head- atraumatic, normocephalic Eyes- PERRLA, EOMI, no pallor, no icterus, no discharge Neck- no lymphadenopathy, no thyromegaly, no jugular vein distension Cardiovascular- normal s1,s2, no murmurs/ rubs/ gallops, normal distal pulses Respiratory- bilateral clear to auscultation, no wheeze, no rhonchi, no crackles Abdomen- bowel sounds present, soft, non tender Musculoskeletal- able to move all 4 extremities, normal range of motion, no leg edema Neurological- no focal deficit, normal reflexes, normal muscle strength, normal sensation to fine touch and vibration Foot- normal pinprick sensation and vibration sense, good distal pulses, callus in left foot Skin- warm and dry Psychiatry- alert and oriented to person, place and time, normal mood and affect  Labs-  Lab Results  Component Value Date   HGBA1C 6.4* 05/23/2014   Lab Results  Component Value Date   TSH 1.79 02/12/2014   CMP  Component Value Date/Time   NA 145* 05/23/2014 0841   NA 139 03/24/2014 0930   K 4.9 05/23/2014 0841   CL 105 05/23/2014 0841   CO2 26 05/23/2014 0841   GLUCOSE 122* 05/23/2014 0841   GLUCOSE 133* 03/24/2014 0930   BUN 17 05/23/2014 0841   BUN 18 03/24/2014 0930   CREATININE 0.89 05/23/2014 0841   CALCIUM 9.2 05/23/2014 0841    PROT 6.7 05/23/2014 0841   PROT 7.1 02/23/2014 1646   ALBUMIN 3.5 02/23/2014 1646   AST 14 05/23/2014 0841   ALT 16 05/23/2014 0841   ALKPHOS 58 05/23/2014 0841   BILITOT 0.5 05/23/2014 0841   GFRNONAA 86 05/23/2014 0841   GFRAA 99 05/23/2014 0841   Assessment/plan  1. Coronary atherosclerosis of unspecified type of vessel, native or graft Remains chest pain free. Continue b blocker, statin, ACEI and lasix with aspirin. Follows with cardiology. - potassium chloride SA (K-DUR,KLOR-CON) 10 MEQ tablet; Take 1 tablet (10 mEq total) by mouth daily.  Dispense: 90 tablet; Refill: 1  2. Atrial fibrillation Rate controlled. Continue amiodarone and coreg. Coumadin on hold for now until dental surgery  3. Type 2 diabetes mellitus with vascular disease Callus treatment option discussed. His wife is a Marine scientist and takes care of his foot. No open wounds, good distal pulses  4. Type II or unspecified type diabetes mellitus with renal manifestations, not stated as uncontrolled Continue ACEI and statin with baby aspirin. On metformin and glipizide. With imporved a1c and some cbg reading on lower side of normal, will d/c glipizide to avoid hypoglycemia. Monitor cbg and recheck a1c prior to next visit. Normal foot exam  - Hemoglobin A1c; Future  5. Hyperlipidemia Stable, cotninue lipitor 10 mg daily, dietary and exercise counelling - CMP; Future  6. Hypokalemia Imporved, will decreased kcl to 10 meq daily to avid hyperkalemia - CMP; Future  7. On amiodarone therapy Rule out drug induced hypothyroidism, check once a year - TSH; Future

## 2014-05-28 ENCOUNTER — Ambulatory Visit (HOSPITAL_COMMUNITY): Payer: Medicare Other | Attending: Cardiology | Admitting: Cardiology

## 2014-05-28 ENCOUNTER — Encounter (HOSPITAL_COMMUNITY): Payer: Medicare Other

## 2014-05-28 DIAGNOSIS — I509 Heart failure, unspecified: Secondary | ICD-10-CM | POA: Diagnosis not present

## 2014-05-28 DIAGNOSIS — I2510 Atherosclerotic heart disease of native coronary artery without angina pectoris: Secondary | ICD-10-CM | POA: Diagnosis not present

## 2014-05-28 DIAGNOSIS — I10 Essential (primary) hypertension: Secondary | ICD-10-CM | POA: Diagnosis not present

## 2014-05-28 DIAGNOSIS — I5022 Chronic systolic (congestive) heart failure: Secondary | ICD-10-CM | POA: Diagnosis not present

## 2014-05-28 DIAGNOSIS — I4891 Unspecified atrial fibrillation: Secondary | ICD-10-CM | POA: Diagnosis not present

## 2014-05-28 DIAGNOSIS — I2589 Other forms of chronic ischemic heart disease: Secondary | ICD-10-CM | POA: Diagnosis not present

## 2014-05-28 DIAGNOSIS — E785 Hyperlipidemia, unspecified: Secondary | ICD-10-CM | POA: Insufficient documentation

## 2014-05-28 DIAGNOSIS — E119 Type 2 diabetes mellitus without complications: Secondary | ICD-10-CM | POA: Diagnosis not present

## 2014-05-28 NOTE — Progress Notes (Signed)
Echo performed. 

## 2014-05-30 ENCOUNTER — Telehealth: Payer: Self-pay | Admitting: Cardiovascular Disease

## 2014-05-30 ENCOUNTER — Telehealth: Payer: Self-pay | Admitting: *Deleted

## 2014-05-30 ENCOUNTER — Encounter (HOSPITAL_COMMUNITY)
Admission: RE | Admit: 2014-05-30 | Discharge: 2014-05-30 | Disposition: A | Discharge status: Home / Self Care | Payer: Medicare Other | Source: Ambulatory Visit | Attending: Cardiovascular Disease | Admitting: Cardiovascular Disease

## 2014-05-30 ENCOUNTER — Encounter (HOSPITAL_COMMUNITY): Payer: Medicare Other

## 2014-05-30 LAB — GLUCOSE, CAPILLARY: Glucose-Capillary: 152 mg/dL — ABNORMAL HIGH (ref 70–99)

## 2014-05-30 NOTE — Telephone Encounter (Signed)
Follow up     Returning a nurses call to get echo results.  In cardiac rehab now----call back after 11am please

## 2014-05-30 NOTE — Telephone Encounter (Signed)
Wife calling wanting the echo results.  Someone just called with results and spoke to her husband and he could not hear well enough to understand the results.  Advised her of results.  States she is concerned that EF is still low.  He does not have an appointment until August w/Dr. Julianne Handler.  Per Scott's note he was going to speak with Dr. Julianne Handler about referring him to EP.  Wife would like an appointment with Dr. Julianne Handler so he can discuss his EF. Advised will forward to Alric Seton his nurse to see if she can work him in sooner than August.

## 2014-05-30 NOTE — Telephone Encounter (Signed)
New message   Patient wife calling wants the nurse to call her back due to husband hard to understand what was said.

## 2014-05-30 NOTE — Telephone Encounter (Signed)
pt notified about echo results with verbal understanding 

## 2014-05-30 NOTE — Telephone Encounter (Signed)
lmptcb for echo results 

## 2014-06-02 ENCOUNTER — Encounter (HOSPITAL_COMMUNITY)
Admission: RE | Admit: 2014-06-02 | Discharge: 2014-06-02 | Disposition: A | Discharge status: Home / Self Care | Payer: Medicare Other | Source: Ambulatory Visit | Attending: Cardiovascular Disease | Admitting: Cardiovascular Disease

## 2014-06-02 ENCOUNTER — Encounter (HOSPITAL_COMMUNITY): Payer: Medicare Other

## 2014-06-03 LAB — GLUCOSE, CAPILLARY: Glucose-Capillary: 196 mg/dL — ABNORMAL HIGH (ref 70–99)

## 2014-06-04 ENCOUNTER — Encounter (HOSPITAL_COMMUNITY)
Admission: RE | Admit: 2014-06-04 | Discharge: 2014-06-04 | Disposition: A | Discharge status: Home / Self Care | Payer: Medicare Other | Source: Ambulatory Visit | Attending: Cardiovascular Disease | Admitting: Cardiovascular Disease

## 2014-06-04 ENCOUNTER — Encounter (HOSPITAL_COMMUNITY): Payer: Medicare Other

## 2014-06-04 LAB — GLUCOSE, CAPILLARY: Glucose-Capillary: 202 mg/dL — ABNORMAL HIGH (ref 70–99)

## 2014-06-06 ENCOUNTER — Encounter (HOSPITAL_COMMUNITY): Payer: Medicare Other

## 2014-06-09 ENCOUNTER — Encounter (HOSPITAL_COMMUNITY)
Admission: RE | Admit: 2014-06-09 | Discharge: 2014-06-09 | Disposition: A | Discharge status: Home / Self Care | Payer: Medicare Other | Source: Ambulatory Visit | Attending: Cardiovascular Disease | Admitting: Cardiovascular Disease

## 2014-06-09 ENCOUNTER — Ambulatory Visit (INDEPENDENT_AMBULATORY_CARE_PROVIDER_SITE_OTHER): Payer: Medicare Other | Admitting: Pharmacist

## 2014-06-09 ENCOUNTER — Encounter (HOSPITAL_COMMUNITY): Payer: Medicare Other

## 2014-06-09 DIAGNOSIS — I4891 Unspecified atrial fibrillation: Secondary | ICD-10-CM

## 2014-06-09 DIAGNOSIS — Z5181 Encounter for therapeutic drug level monitoring: Secondary | ICD-10-CM | POA: Diagnosis not present

## 2014-06-09 LAB — GLUCOSE, CAPILLARY: Glucose-Capillary: 158 mg/dL — ABNORMAL HIGH (ref 70–99)

## 2014-06-09 LAB — POCT INR: INR: 1.4

## 2014-06-10 ENCOUNTER — Ambulatory Visit: Payer: Medicare Other | Admitting: Internal Medicine

## 2014-06-11 ENCOUNTER — Encounter (HOSPITAL_COMMUNITY)
Admission: RE | Admit: 2014-06-11 | Discharge: 2014-06-11 | Disposition: A | Discharge status: Home / Self Care | Payer: Medicare Other | Source: Ambulatory Visit | Attending: Cardiovascular Disease | Admitting: Cardiovascular Disease

## 2014-06-11 ENCOUNTER — Encounter (HOSPITAL_COMMUNITY): Payer: Medicare Other

## 2014-06-11 LAB — GLUCOSE, CAPILLARY: Glucose-Capillary: 199 mg/dL — ABNORMAL HIGH (ref 70–99)

## 2014-06-11 NOTE — Telephone Encounter (Signed)
Will forward to [name] to see if pt needs EP referral at this time.

## 2014-06-12 NOTE — Telephone Encounter (Signed)
Spoke with pt's wife and gave her information from [name]. She reports pt had one episode of shortness of breath recently that lasted about 5 minutes. None since. She notes he may be sleeping more than just after hospital discharge. No other complaints.  Pt's wife does not feel he needs earlier appt at this time but will let us know if pt has any problems prior to scheduled appt in August.

## 2014-06-12 NOTE — Telephone Encounter (Signed)
Left message to call back  

## 2014-06-12 NOTE — Telephone Encounter (Signed)
His LVEF is 35-40% and he does not qualify for ICD based on this and his lack of symptoms. Continue medical management at this time. Gerald Stabs

## 2014-06-13 ENCOUNTER — Encounter (HOSPITAL_COMMUNITY)
Admission: RE | Admit: 2014-06-13 | Discharge: 2014-06-13 | Disposition: A | Discharge status: Home / Self Care | Payer: Medicare Other | Source: Ambulatory Visit | Attending: Cardiovascular Disease | Admitting: Cardiovascular Disease

## 2014-06-13 ENCOUNTER — Encounter (HOSPITAL_COMMUNITY): Payer: Medicare Other

## 2014-06-13 LAB — GLUCOSE, CAPILLARY: Glucose-Capillary: 206 mg/dL — ABNORMAL HIGH (ref 70–99)

## 2014-06-16 ENCOUNTER — Encounter (HOSPITAL_COMMUNITY)
Admission: RE | Admit: 2014-06-16 | Discharge: 2014-06-16 | Disposition: A | Discharge status: Home / Self Care | Payer: Medicare Other | Source: Ambulatory Visit | Attending: Cardiovascular Disease | Admitting: Cardiovascular Disease

## 2014-06-16 ENCOUNTER — Encounter (HOSPITAL_COMMUNITY): Payer: Medicare Other

## 2014-06-16 LAB — GLUCOSE, CAPILLARY: Glucose-Capillary: 217 mg/dL — ABNORMAL HIGH (ref 70–99)

## 2014-06-18 ENCOUNTER — Other Ambulatory Visit: Payer: Self-pay | Admitting: Physician Assistant

## 2014-06-18 ENCOUNTER — Encounter (HOSPITAL_COMMUNITY): Payer: Medicare Other

## 2014-06-18 ENCOUNTER — Encounter (HOSPITAL_COMMUNITY)
Admission: RE | Admit: 2014-06-18 | Discharge: 2014-06-18 | Disposition: A | Discharge status: Home / Self Care | Payer: Medicare Other | Source: Ambulatory Visit | Attending: Cardiovascular Disease | Admitting: Cardiovascular Disease

## 2014-06-18 LAB — GLUCOSE, CAPILLARY: Glucose-Capillary: 195 mg/dL — ABNORMAL HIGH (ref 70–99)

## 2014-06-18 NOTE — Progress Notes (Signed)
Pt with elevated bp today with exercise.  Pt reports no changes in his routine today and is asymptomatic. Will send rehab report over for review by [name]. Cherre Huger, BSN

## 2014-06-20 ENCOUNTER — Encounter (HOSPITAL_COMMUNITY): Payer: Medicare Other

## 2014-06-20 ENCOUNTER — Other Ambulatory Visit: Payer: Self-pay

## 2014-06-20 ENCOUNTER — Encounter (HOSPITAL_COMMUNITY)
Admission: RE | Admit: 2014-06-20 | Discharge: 2014-06-20 | Disposition: A | Discharge status: Home / Self Care | Payer: Medicare Other | Source: Ambulatory Visit | Attending: Cardiovascular Disease | Admitting: Cardiovascular Disease

## 2014-06-20 MED ORDER — AMIODARONE HCL 200 MG PO TABS: 200.0000 mg | ORAL_TABLET | Freq: Every day | ORAL | Status: DC

## 2014-06-20 MED ORDER — WARFARIN SODIUM 5 MG PO TABS: 5.0000 mg | ORAL_TABLET | Freq: Every day | ORAL | Status: DC

## 2014-06-23 ENCOUNTER — Encounter (HOSPITAL_COMMUNITY): Payer: Medicare Other

## 2014-06-23 ENCOUNTER — Ambulatory Visit (INDEPENDENT_AMBULATORY_CARE_PROVIDER_SITE_OTHER): Payer: Medicare Other | Admitting: *Deleted

## 2014-06-23 DIAGNOSIS — Z5181 Encounter for therapeutic drug level monitoring: Secondary | ICD-10-CM

## 2014-06-23 DIAGNOSIS — I4891 Unspecified atrial fibrillation: Secondary | ICD-10-CM

## 2014-06-23 LAB — POCT INR: INR: 1.7

## 2014-06-23 LAB — GLUCOSE, CAPILLARY: Glucose-Capillary: 264 mg/dL — ABNORMAL HIGH (ref 70–99)

## 2014-06-25 ENCOUNTER — Encounter (HOSPITAL_COMMUNITY): Payer: Medicare Other

## 2014-06-30 ENCOUNTER — Encounter (HOSPITAL_COMMUNITY)
Admission: RE | Admit: 2014-06-30 | Discharge: 2014-06-30 | Disposition: A | Discharge status: Home / Self Care | Payer: Medicare Other | Source: Ambulatory Visit | Attending: Cardiovascular Disease | Admitting: Cardiovascular Disease

## 2014-06-30 ENCOUNTER — Encounter (HOSPITAL_COMMUNITY): Payer: Medicare Other

## 2014-06-30 DIAGNOSIS — Z951 Presence of aortocoronary bypass graft: Secondary | ICD-10-CM | POA: Insufficient documentation

## 2014-06-30 LAB — GLUCOSE, CAPILLARY: Glucose-Capillary: 260 mg/dL — ABNORMAL HIGH (ref 70–99)

## 2014-07-02 ENCOUNTER — Encounter (HOSPITAL_COMMUNITY): Payer: Medicare Other

## 2014-07-02 ENCOUNTER — Encounter (HOSPITAL_COMMUNITY)
Admission: RE | Admit: 2014-07-02 | Discharge: 2014-07-02 | Disposition: A | Discharge status: Home / Self Care | Payer: Medicare Other | Source: Ambulatory Visit | Attending: Cardiovascular Disease | Admitting: Cardiovascular Disease

## 2014-07-02 DIAGNOSIS — Z951 Presence of aortocoronary bypass graft: Secondary | ICD-10-CM | POA: Diagnosis not present

## 2014-07-02 LAB — GLUCOSE, CAPILLARY: Glucose-Capillary: 246 mg/dL — ABNORMAL HIGH (ref 70–99)

## 2014-07-04 ENCOUNTER — Ambulatory Visit (INDEPENDENT_AMBULATORY_CARE_PROVIDER_SITE_OTHER): Payer: Medicare Other | Admitting: Pharmacist

## 2014-07-04 ENCOUNTER — Encounter (HOSPITAL_COMMUNITY): Payer: Medicare Other

## 2014-07-04 ENCOUNTER — Encounter (HOSPITAL_COMMUNITY)
Admission: RE | Admit: 2014-07-04 | Discharge: 2014-07-04 | Disposition: A | Discharge status: Home / Self Care | Payer: Medicare Other | Source: Ambulatory Visit | Attending: Cardiovascular Disease | Admitting: Cardiovascular Disease

## 2014-07-04 DIAGNOSIS — I4891 Unspecified atrial fibrillation: Secondary | ICD-10-CM

## 2014-07-04 DIAGNOSIS — Z951 Presence of aortocoronary bypass graft: Secondary | ICD-10-CM | POA: Diagnosis not present

## 2014-07-04 DIAGNOSIS — Z5181 Encounter for therapeutic drug level monitoring: Secondary | ICD-10-CM | POA: Diagnosis not present

## 2014-07-04 LAB — GLUCOSE, CAPILLARY: Glucose-Capillary: 258 mg/dL — ABNORMAL HIGH (ref 70–99)

## 2014-07-04 LAB — POCT INR: INR: 2.1

## 2014-07-07 ENCOUNTER — Encounter (HOSPITAL_COMMUNITY)
Admission: RE | Admit: 2014-07-07 | Discharge: 2014-07-07 | Disposition: A | Discharge status: Home / Self Care | Payer: Medicare Other | Source: Ambulatory Visit | Attending: Cardiovascular Disease | Admitting: Cardiovascular Disease

## 2014-07-07 ENCOUNTER — Other Ambulatory Visit: Payer: Self-pay | Admitting: Internal Medicine

## 2014-07-07 ENCOUNTER — Encounter (HOSPITAL_COMMUNITY): Payer: Medicare Other

## 2014-07-07 DIAGNOSIS — Z951 Presence of aortocoronary bypass graft: Secondary | ICD-10-CM | POA: Diagnosis not present

## 2014-07-07 LAB — GLUCOSE, CAPILLARY: Glucose-Capillary: 230 mg/dL — ABNORMAL HIGH (ref 70–99)

## 2014-07-09 ENCOUNTER — Encounter (HOSPITAL_COMMUNITY)
Admission: RE | Admit: 2014-07-09 | Discharge: 2014-07-09 | Disposition: A | Discharge status: Home / Self Care | Payer: Medicare Other | Source: Ambulatory Visit | Attending: Cardiovascular Disease | Admitting: Cardiovascular Disease

## 2014-07-09 ENCOUNTER — Encounter (HOSPITAL_COMMUNITY): Payer: Medicare Other

## 2014-07-09 DIAGNOSIS — Z951 Presence of aortocoronary bypass graft: Secondary | ICD-10-CM | POA: Diagnosis not present

## 2014-07-09 LAB — GLUCOSE, CAPILLARY: Glucose-Capillary: 241 mg/dL — ABNORMAL HIGH (ref 70–99)

## 2014-07-10 NOTE — Progress Notes (Signed)
Patient ID: Seth [name], male   DOB: Mar 14, 1941, 73 y.o.   MRN: [id number] Attending physician note: I personally interviewed and examined this patient on the day of discharge along with resident physician Dr. Albin Felling and I concur with her.problem assessment and discharge plan. Patient will go home with hospice care. Anticipate further decline. We will follow him in our internal medicine clinic.

## 2014-07-11 ENCOUNTER — Encounter (HOSPITAL_COMMUNITY)
Admission: RE | Admit: 2014-07-11 | Discharge: 2014-07-11 | Disposition: A | Discharge status: Home / Self Care | Payer: Medicare Other | Source: Ambulatory Visit | Attending: Cardiovascular Disease | Admitting: Cardiovascular Disease

## 2014-07-11 ENCOUNTER — Encounter (HOSPITAL_COMMUNITY): Payer: Medicare Other

## 2014-07-11 DIAGNOSIS — Z951 Presence of aortocoronary bypass graft: Secondary | ICD-10-CM | POA: Diagnosis not present

## 2014-07-11 LAB — GLUCOSE, CAPILLARY: Glucose-Capillary: 256 mg/dL — ABNORMAL HIGH (ref 70–99)

## 2014-07-14 ENCOUNTER — Encounter (HOSPITAL_COMMUNITY)
Admission: RE | Admit: 2014-07-14 | Discharge: 2014-07-14 | Disposition: A | Discharge status: Home / Self Care | Payer: Medicare Other | Source: Ambulatory Visit | Attending: Cardiovascular Disease | Admitting: Cardiovascular Disease

## 2014-07-14 ENCOUNTER — Encounter (HOSPITAL_COMMUNITY): Payer: Medicare Other

## 2014-07-14 DIAGNOSIS — Z951 Presence of aortocoronary bypass graft: Secondary | ICD-10-CM | POA: Diagnosis not present

## 2014-07-14 LAB — GLUCOSE, CAPILLARY: Glucose-Capillary: 245 mg/dL — ABNORMAL HIGH (ref 70–99)

## 2014-07-16 ENCOUNTER — Encounter (HOSPITAL_COMMUNITY)
Admission: RE | Admit: 2014-07-16 | Discharge: 2014-07-16 | Disposition: A | Discharge status: Home / Self Care | Payer: Medicare Other | Source: Ambulatory Visit | Attending: Cardiovascular Disease | Admitting: Cardiovascular Disease

## 2014-07-16 ENCOUNTER — Encounter (HOSPITAL_COMMUNITY): Payer: Medicare Other

## 2014-07-16 DIAGNOSIS — Z951 Presence of aortocoronary bypass graft: Secondary | ICD-10-CM | POA: Diagnosis not present

## 2014-07-16 LAB — GLUCOSE, CAPILLARY: Glucose-Capillary: 217 mg/dL — ABNORMAL HIGH (ref 70–99)

## 2014-07-18 ENCOUNTER — Encounter (HOSPITAL_COMMUNITY): Payer: Medicare Other

## 2014-07-18 ENCOUNTER — Encounter (HOSPITAL_COMMUNITY)
Admission: RE | Admit: 2014-07-18 | Discharge: 2014-07-18 | Disposition: A | Discharge status: Home / Self Care | Payer: Medicare Other | Source: Ambulatory Visit | Attending: Cardiovascular Disease | Admitting: Cardiovascular Disease

## 2014-07-18 DIAGNOSIS — Z951 Presence of aortocoronary bypass graft: Secondary | ICD-10-CM | POA: Diagnosis not present

## 2014-07-18 LAB — GLUCOSE, CAPILLARY: Glucose-Capillary: 276 mg/dL — ABNORMAL HIGH (ref 70–99)

## 2014-07-18 NOTE — Progress Notes (Signed)
Noticed the patient has a notched QRS today. Seth [name] remains in Sinus Rhythm. Vital signs stable.  Will fax today's ECG tracing for Dr Angelena Form to review. Today's ECG tracing looks familiar to office 12 lead ECG obtained on 04/29/2014. Seth [name]'s blood sugars have been in the 200's. Will fax exercise flow sheets to Dr Bubba Camp for review.

## 2014-07-21 ENCOUNTER — Encounter (HOSPITAL_COMMUNITY)
Admission: RE | Admit: 2014-07-21 | Discharge: 2014-07-21 | Disposition: A | Discharge status: Home / Self Care | Payer: Medicare Other | Source: Ambulatory Visit | Attending: Cardiovascular Disease | Admitting: Cardiovascular Disease

## 2014-07-21 DIAGNOSIS — I10 Essential (primary) hypertension: Secondary | ICD-10-CM | POA: Diagnosis not present

## 2014-07-21 DIAGNOSIS — Z951 Presence of aortocoronary bypass graft: Secondary | ICD-10-CM | POA: Diagnosis not present

## 2014-07-21 DIAGNOSIS — R809 Proteinuria, unspecified: Secondary | ICD-10-CM | POA: Diagnosis not present

## 2014-07-21 LAB — GLUCOSE, CAPILLARY: Glucose-Capillary: 234 mg/dL — ABNORMAL HIGH (ref 70–99)

## 2014-07-22 ENCOUNTER — Encounter: Payer: Self-pay | Admitting: Internal Medicine

## 2014-07-22 ENCOUNTER — Ambulatory Visit (INDEPENDENT_AMBULATORY_CARE_PROVIDER_SITE_OTHER): Payer: Medicare Other | Admitting: Internal Medicine

## 2014-07-22 VITALS — BP 154/90 | HR 86 | Temp 97.8°F | Wt 241.6 lb

## 2014-07-22 DIAGNOSIS — I798 Other disorders of arteries, arterioles and capillaries in diseases classified elsewhere: Secondary | ICD-10-CM

## 2014-07-22 DIAGNOSIS — I200 Unstable angina: Secondary | ICD-10-CM

## 2014-07-22 DIAGNOSIS — E1159 Type 2 diabetes mellitus with other circulatory complications: Secondary | ICD-10-CM | POA: Diagnosis not present

## 2014-07-22 MED ORDER — GLIPIZIDE 5 MG PO TABS: 2.5000 mg | ORAL_TABLET | Freq: Every day | ORAL | Status: DC

## 2014-07-22 NOTE — Progress Notes (Signed)
Patient ID: Seth [name], male   DOB: Oct 28, 1941, 73 y.o.   MRN: [id number]    No Known Allergies  HPI 73 y/o male pt seen today for blood sugar reading follow up. His recent a1c is 6.4 and he is currently On metformin 1000 bid. He was taken off glipizide on his last visit with his sugar reading well controlled and his age cbg 150-175 at home fasting and cbg in cardiac rehab centre has been in 200s. He has been nauseous lately and not been complaint with his diet. He is seeing cardiology next week to review and adjust his cardiac meds with concerns of it causing his nausea. He has been on metformin for several years without any gi complaints while on it before  ROS No fever or chills No blurry vision No chest pain or dyspnea No vomiting or abdominal pain No diarrhea. Has regular bowel movement  Past Medical History  Diagnosis Date  . Impotence of organic origin   . CKD (chronic kidney disease)     proteinuria  . Type II or unspecified type diabetes mellitus with renal manifestations, uncontrolled   . Peripheral vascular disease, unspecified   . HTN (hypertension)   . Hemorrhage of rectum and anus   . Prostate cancer   . Thrombocytopenia, unspecified   . Tobacco use disorder   . Nephrolithiasis   . Hemorrhage of gastrointestinal tract, unspecified   . HLD (hyperlipidemia)   . CAD (coronary artery disease)     a.  Lexiscan Myoview (02/14/14):  Apical, inferolateral scar; ? Mild ischemia; EF 32%.;  b.  LHC (02/21/14):  dLM 100%, LAD 100%, CFX 100%, dRCA 50%. => CABG (L-LAD, S-OM, S-PDA)  . Ischemic cardiomyopathy     a. Echo (02/17/14):  EF 35-40%, anteroseptal and apical AK, Gr 2 DD, MAC, mod LAE.  [name] Chronic systolic heart failure   . Atrial fibrillation     a. coumadin;  b. Amiodarone   Medication reviewed. See [facility]  Physical exam BP 154/90  Pulse 86  Temp(Src) 97.8 F (36.6 C) (Oral)  Wt 241 lb 9.6 oz (109.589 kg)  SpO2 97%  General- elderly male in no acute distress,  overweight Head- atraumatic, normocephalic Eyes- PERRLA, EOMI, no pallor, no icterus, no discharge Neck- no lymphadenopathy, no thyromegaly, no jugular vein distension Cardiovascular- normal s1,s2, no murmurs/ rubs/ gallops, normal distal pulses Respiratory- bilateral clear to auscultation, no wheeze, no rhonchi, no crackles Abdomen- bowel sounds present, soft, non tender Musculoskeletal- able to move all 4 extremities, normal range of motion, no leg edema Neurological- no focal deficit Skin- warm and dry Psychiatry- alert and oriented to person, place and time, normal mood and affect  Labs  cbg in office 220  Lab Results  Component Value Date   HGBA1C 6.4* 05/23/2014   Lab Results  Component Value Date   CREATININE 0.89 05/23/2014   Assessment/plan  1. Type 2 diabetes mellitus with vascular disease With his cbg being elevated and non complaint with diet, i will reintroduce glipizide at 2.5 mg daily for now. Continue metformin 1000 mg bid for now. After cardiac meds are reviewed and adjusted, if pt continues to be nauseous, consider adjusting metformin. Warning signs with hypoglycemia explained - Hemoglobin A1c; Future - Basic Metabolic Panel; Future

## 2014-07-23 ENCOUNTER — Encounter (HOSPITAL_COMMUNITY)
Admission: RE | Admit: 2014-07-23 | Discharge: 2014-07-23 | Disposition: A | Discharge status: Home / Self Care | Payer: Medicare Other | Source: Ambulatory Visit | Attending: Cardiovascular Disease | Admitting: Cardiovascular Disease

## 2014-07-23 DIAGNOSIS — Z951 Presence of aortocoronary bypass graft: Secondary | ICD-10-CM | POA: Diagnosis not present

## 2014-07-23 LAB — GLUCOSE, CAPILLARY: Glucose-Capillary: 221 mg/dL — ABNORMAL HIGH (ref 70–99)

## 2014-07-25 ENCOUNTER — Ambulatory Visit (INDEPENDENT_AMBULATORY_CARE_PROVIDER_SITE_OTHER): Payer: Medicare Other | Admitting: Pharmacist

## 2014-07-25 ENCOUNTER — Encounter (HOSPITAL_COMMUNITY)
Admission: RE | Admit: 2014-07-25 | Discharge: 2014-07-25 | Disposition: A | Discharge status: Home / Self Care | Payer: Medicare Other | Source: Ambulatory Visit | Attending: Cardiovascular Disease | Admitting: Cardiovascular Disease

## 2014-07-25 DIAGNOSIS — I4891 Unspecified atrial fibrillation: Secondary | ICD-10-CM | POA: Diagnosis not present

## 2014-07-25 DIAGNOSIS — Z5181 Encounter for therapeutic drug level monitoring: Secondary | ICD-10-CM

## 2014-07-25 DIAGNOSIS — Z951 Presence of aortocoronary bypass graft: Secondary | ICD-10-CM | POA: Diagnosis not present

## 2014-07-25 LAB — GLUCOSE, CAPILLARY: Glucose-Capillary: 224 mg/dL — ABNORMAL HIGH (ref 70–99)

## 2014-07-25 LAB — POCT INR: INR: 3.3

## 2014-07-28 ENCOUNTER — Encounter (HOSPITAL_COMMUNITY)
Admission: RE | Admit: 2014-07-28 | Discharge: 2014-07-28 | Disposition: A | Discharge status: Home / Self Care | Payer: Medicare Other | Source: Ambulatory Visit | Attending: Cardiovascular Disease | Admitting: Cardiovascular Disease

## 2014-07-28 DIAGNOSIS — Z951 Presence of aortocoronary bypass graft: Secondary | ICD-10-CM | POA: Insufficient documentation

## 2014-07-29 LAB — GLUCOSE, CAPILLARY: Glucose-Capillary: 235 mg/dL — ABNORMAL HIGH (ref 70–99)

## 2014-07-30 ENCOUNTER — Encounter (HOSPITAL_COMMUNITY)
Admission: RE | Admit: 2014-07-30 | Discharge: 2014-07-30 | Disposition: A | Discharge status: Home / Self Care | Payer: Medicare Other | Source: Ambulatory Visit | Attending: Cardiovascular Disease | Admitting: Cardiovascular Disease

## 2014-07-30 DIAGNOSIS — Z951 Presence of aortocoronary bypass graft: Secondary | ICD-10-CM | POA: Diagnosis not present

## 2014-07-30 LAB — GLUCOSE, CAPILLARY: Glucose-Capillary: 246 mg/dL — ABNORMAL HIGH (ref 70–99)

## 2014-07-31 ENCOUNTER — Encounter: Payer: Self-pay | Admitting: Cardiovascular Disease

## 2014-07-31 ENCOUNTER — Ambulatory Visit (INDEPENDENT_AMBULATORY_CARE_PROVIDER_SITE_OTHER): Payer: Medicare Other | Admitting: Cardiovascular Disease

## 2014-07-31 ENCOUNTER — Ambulatory Visit (INDEPENDENT_AMBULATORY_CARE_PROVIDER_SITE_OTHER): Payer: Medicare Other

## 2014-07-31 VITALS — BP 162/90 | HR 76 | Ht 68.0 in | Wt 242.0 lb

## 2014-07-31 DIAGNOSIS — I10 Essential (primary) hypertension: Secondary | ICD-10-CM

## 2014-07-31 DIAGNOSIS — Z5181 Encounter for therapeutic drug level monitoring: Secondary | ICD-10-CM

## 2014-07-31 DIAGNOSIS — I200 Unstable angina: Secondary | ICD-10-CM | POA: Diagnosis not present

## 2014-07-31 DIAGNOSIS — I4891 Unspecified atrial fibrillation: Secondary | ICD-10-CM

## 2014-07-31 DIAGNOSIS — I5022 Chronic systolic (congestive) heart failure: Secondary | ICD-10-CM | POA: Diagnosis not present

## 2014-07-31 DIAGNOSIS — I255 Ischemic cardiomyopathy: Secondary | ICD-10-CM

## 2014-07-31 DIAGNOSIS — E785 Hyperlipidemia, unspecified: Secondary | ICD-10-CM

## 2014-07-31 DIAGNOSIS — I480 Paroxysmal atrial fibrillation: Secondary | ICD-10-CM

## 2014-07-31 DIAGNOSIS — R809 Proteinuria, unspecified: Secondary | ICD-10-CM | POA: Diagnosis not present

## 2014-07-31 DIAGNOSIS — I2510 Atherosclerotic heart disease of native coronary artery without angina pectoris: Secondary | ICD-10-CM

## 2014-07-31 DIAGNOSIS — I2589 Other forms of chronic ischemic heart disease: Secondary | ICD-10-CM

## 2014-07-31 LAB — POCT INR: INR: 2.9

## 2014-07-31 NOTE — Progress Notes (Signed)
History of Present Illness: 73 y.o. male with a hx of DM, HTN, HLD, CAD, prostate CA, renal insufficiency, obesity here today for cardiac follow up. He was seen as a new patient 02/12/14 for evaluation of atrial fib. He was placed on Xarelto. He was set up for echo and stress test. Stress test was abnormal with apical and inferolateral scar and ischemia, LVEF of 32%. Echo 02/17/14 with LVEF=35-40% with akinesis of the mid-distalanteroseptal and apical myocardium, MAC, dilated LA. Cardiac cath 02/21/14 with occluded left main, moderate RCA stenosis with the entire left system filling from right to left collaterals. 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 He was discharged on amiodarone and coumadin. Echo 05/28/14 with LVEF=35-40%, concentric LVH, mild MR.   He is here today for follow up. He tells me that he feels well but he has had more fatigue lately. BP meds are being adjusted by Nephrology. No chest pain or SOB. He has been active. Weight is stable.   Primary Care Physician: Bubba Camp  Last Lipid Profile:Lipid Panel     Component Value Date/Time   TRIG 80 05/23/2014 0841   HDL 45 05/23/2014 0841   CHOLHDL 2.2 05/23/2014 0841   LDLCALC 40 05/23/2014 0841     Past Medical History  Diagnosis Date  . Impotence of organic origin   . CKD (chronic kidney disease)     proteinuria  . Type II or unspecified type diabetes mellitus with renal manifestations, uncontrolled   . Peripheral vascular disease, unspecified   . HTN (hypertension)   . Hemorrhage of rectum and anus   . Prostate cancer   . Thrombocytopenia, unspecified   . Tobacco use disorder   . Nephrolithiasis   . Hemorrhage of gastrointestinal tract, unspecified   . HLD (hyperlipidemia)   . CAD (coronary artery disease)     a.  Lexiscan Myoview (02/14/14):  Apical, inferolateral scar; ? Mild ischemia; EF 32%.;  b.  LHC (02/21/14):  dLM 100%, LAD 100%, CFX 100%, dRCA 50%. => CABG (L-LAD, S-OM, S-PDA)  .  Ischemic cardiomyopathy     a. Echo (02/17/14):  EF 35-40%, anteroseptal and apical AK, Gr 2 DD, MAC, mod LAE.  [name] Chronic systolic heart failure   . Atrial fibrillation     a. coumadin;  b. Amiodarone    Past Surgical History  Procedure Laterality Date  . Back surgery    . Cataract extraction  2010  . Coronary artery bypass graft N/A 02/24/2014    Procedure: Coronary artery bypass graft times three using left internal mammary artery and left leg greater saphenous vein harvested endoscopically.;  Surgeon: Gaye Pollack, MD;  Location: [facility] OR;  Service: Open Heart Surgery;  Laterality: N/A;  . Intraoperative transesophageal echocardiogram N/A 02/24/2014    Procedure: INTRAOPERATIVE TRANSESOPHAGEAL ECHOCARDIOGRAM;  Surgeon: Gaye Pollack, MD;  Location: [facility] OR;  Service: Open Heart Surgery;  Laterality: N/A;  . Clipping of atrial appendage Left 02/24/2014    Procedure: CLIPPING OF ATRIAL APPENDAGE;  Surgeon: Gaye Pollack, MD;  Location: [place];  Service: Open Heart Surgery;  Laterality: Left;  . Colonoscopy  2005    Normal    Current Outpatient Prescriptions  Medication Sig Dispense Refill  . amiodarone (PACERONE) 200 MG tablet Take 1 tablet (200 mg total) by mouth daily.  30 tablet  2  . aspirin EC 81 MG tablet Take 1 tablet (81 mg total) by mouth daily.      [name]  atorvastatin (LIPITOR) 20 MG tablet Take 20 mg by mouth daily.      . Calcium Carbonate-Vitamin D 600-200 MG-UNIT TABS Take 1 tablet by mouth daily. For bones       . carvedilol (COREG) 6.25 MG tablet Take 1 tablet (6.25 mg total) by mouth 2 (two) times daily with a meal.  180 tablet  3  . Flaxseed, Linseed, (FLAXSEED OIL PO) Take 1 tablet by mouth daily.       . furosemide (LASIX) 40 MG tablet Take 1 tablet (40 mg total) by mouth daily.  30 tablet  1  . glipiZIDE (GLUCOTROL) 5 MG tablet Take 0.5 tablets (2.5 mg total) by mouth daily before breakfast.  15 tablet  3  . lisinopril (PRINIVIL,ZESTRIL) 10 MG tablet Take 1 tablet (10 mg  total) by mouth daily.  90 tablet  3  . metFORMIN (GLUCOPHAGE) 1000 MG tablet TAKE 1 TABLET BY MOUTH TWICE A DAY FOR DIABETES  60 tablet  5  . Multiple Vitamin (MULTIVITAMINS PO) Take 1 tablet by mouth daily.        . potassium chloride SA (K-DUR,KLOR-CON) 10 MEQ tablet Take 1 tablet (10 mEq total) by mouth daily.  90 tablet  1  . warfarin (COUMADIN) 5 MG tablet Take 1 tablet (5 mg total) by mouth daily. Or as directed by [facility]  60 tablet  3   No current facility-administered medications for this visit.    No Known Allergies  History   Social History  . Marital Status: Married    Spouse Name: N/A    Number of Children: 3  . Years of Education: N/A   Occupational History  . Sell storage buildings    Social History Main Topics  . Smoking status: Never Smoker   . Smokeless tobacco: Former Systems developer    Types: Chew    Quit date: 02/23/2014  . Alcohol Use: No  . Drug Use: No  . Sexual Activity: Not on file   Other Topics Concern  . Not on file   Social History Narrative  . No narrative on file    Family History  Problem Relation Age of Onset  . Cancer Mother 48    leukemia  . Heart disease Father   . Heart attack Father 12    Review of Systems:  As stated in the HPI and otherwise negative.   BP 162/90  Pulse 76  Ht 5\' 8"  (1.727 m)  Wt 242 lb (109.77 kg)  BMI 36.80 kg/m2  Physical Examination: General: Well developed, well nourished, NAD HEENT: OP clear, mucus membranes moist SKIN: warm, dry. No rashes. Neuro: No focal deficits Musculoskeletal: Muscle strength 5/5 all ext Psychiatric: Mood and affect normal Neck: No JVD, no carotid bruits, no thyromegaly, no lymphadenopathy. Lungs:Clear bilaterally, no wheezes, rhonci, crackles Cardiovascular: Regular rate and rhythm. No murmurs, gallops or rubs. Abdomen:Soft. Bowel sounds present. Non-tender.  Extremities: No lower extremity edema. Pulses are 2 + in the bilateral DP/PT.  EKG: NSR, rate 76 bpm. Old  septal infarct. Non-specific ST and T wave abnormalities. QTc 510 msec. Unchanged.   Echo 05/28/14: Left ventricle: The cavity size was normal. There was moderate concentric hypertrophy. Systolic function was moderately reduced. The estimated ejection fraction was in the range of 35% to 40%. There is akinesis of the mid-apicalanterior, inferoseptal, and apical myocardium. Features are consistent with a pseudonormal left ventricular filling pattern, with concomitant abnormal relaxation and increased filling pressure (grade 2 diastolic dysfunction). - Mitral valve: There was  mild regurgitation. - Left atrium: The atrium was mildly to moderately dilated. - Right atrium: The atrium was mildly dilated.  Assessment and Plan:   1. CAD: s/p CABG. Stable. Continue current meds. He is starting cardiac rehab.   2. Atrial fibrillation: Sinus today. Will stop amiodarone with nausea. Continue Coreg for rate control and coumadin.   3. Ischemic cardiomyopathy: LVEF=35-40% by echo June 2015. Will continue medical management    4. Chronic systolic CHF: Volume status is ok. Continue Lasix.   5. HTN: BP elevated. This is being managed in Nephrology  6. HLD: Lipids controlled. Continue statin.

## 2014-07-31 NOTE — Patient Instructions (Signed)
Your physician recommends that you schedule a follow-up appointment in:  6-8 weeks--Scheduled for September 23, 2014 at 11:15  Your physician has recommended you make the following change in your medication:  Stop amiodarone

## 2014-08-01 ENCOUNTER — Encounter (HOSPITAL_COMMUNITY)
Admission: RE | Admit: 2014-08-01 | Discharge: 2014-08-01 | Disposition: A | Discharge status: Home / Self Care | Payer: Medicare Other | Source: Ambulatory Visit | Attending: Cardiovascular Disease | Admitting: Cardiovascular Disease

## 2014-08-01 DIAGNOSIS — Z951 Presence of aortocoronary bypass graft: Secondary | ICD-10-CM | POA: Diagnosis not present

## 2014-08-01 LAB — GLUCOSE, CAPILLARY: Glucose-Capillary: 222 mg/dL — ABNORMAL HIGH (ref 70–99)

## 2014-08-04 ENCOUNTER — Encounter (HOSPITAL_COMMUNITY)
Admission: RE | Admit: 2014-08-04 | Discharge: 2014-08-04 | Disposition: A | Discharge status: Home / Self Care | Payer: Medicare Other | Source: Ambulatory Visit | Attending: Cardiovascular Disease | Admitting: Cardiovascular Disease

## 2014-08-04 DIAGNOSIS — Z951 Presence of aortocoronary bypass graft: Secondary | ICD-10-CM | POA: Diagnosis not present

## 2014-08-04 LAB — GLUCOSE, CAPILLARY: Glucose-Capillary: 181 mg/dL — ABNORMAL HIGH (ref 70–99)

## 2014-08-05 DIAGNOSIS — R809 Proteinuria, unspecified: Secondary | ICD-10-CM | POA: Diagnosis not present

## 2014-08-05 DIAGNOSIS — R319 Hematuria, unspecified: Secondary | ICD-10-CM | POA: Diagnosis not present

## 2014-08-06 ENCOUNTER — Encounter (HOSPITAL_COMMUNITY)
Admission: RE | Admit: 2014-08-06 | Discharge: 2014-08-06 | Disposition: A | Discharge status: Home / Self Care | Payer: Medicare Other | Source: Ambulatory Visit | Attending: Cardiovascular Disease | Admitting: Cardiovascular Disease

## 2014-08-06 DIAGNOSIS — Z951 Presence of aortocoronary bypass graft: Secondary | ICD-10-CM | POA: Diagnosis not present

## 2014-08-06 LAB — GLUCOSE, CAPILLARY: Glucose-Capillary: 174 mg/dL — ABNORMAL HIGH (ref 70–99)

## 2014-08-08 ENCOUNTER — Encounter (HOSPITAL_COMMUNITY)
Admission: RE | Admit: 2014-08-08 | Discharge: 2014-08-08 | Disposition: A | Discharge status: Home / Self Care | Payer: Medicare Other | Source: Ambulatory Visit | Attending: Cardiovascular Disease | Admitting: Cardiovascular Disease

## 2014-08-08 DIAGNOSIS — Z951 Presence of aortocoronary bypass graft: Secondary | ICD-10-CM | POA: Diagnosis not present

## 2014-08-08 LAB — GLUCOSE, CAPILLARY: Glucose-Capillary: 216 mg/dL — ABNORMAL HIGH (ref 70–99)

## 2014-08-08 NOTE — Progress Notes (Signed)
Tommy graduates today and plans to continue walking on the treadmill at home.

## 2014-08-11 ENCOUNTER — Encounter (HOSPITAL_COMMUNITY): Payer: Medicare Other

## 2014-08-11 DIAGNOSIS — E291 Testicular hypofunction: Secondary | ICD-10-CM | POA: Diagnosis not present

## 2014-08-11 DIAGNOSIS — Z8546 Personal history of malignant neoplasm of prostate: Secondary | ICD-10-CM | POA: Diagnosis not present

## 2014-08-13 ENCOUNTER — Encounter (HOSPITAL_COMMUNITY): Payer: Medicare Other

## 2014-08-14 ENCOUNTER — Ambulatory Visit (INDEPENDENT_AMBULATORY_CARE_PROVIDER_SITE_OTHER): Payer: Medicare Other

## 2014-08-14 DIAGNOSIS — I4891 Unspecified atrial fibrillation: Secondary | ICD-10-CM | POA: Diagnosis not present

## 2014-08-14 DIAGNOSIS — Z5181 Encounter for therapeutic drug level monitoring: Secondary | ICD-10-CM | POA: Diagnosis not present

## 2014-08-14 LAB — POCT INR: INR: 3.4

## 2014-08-15 ENCOUNTER — Encounter (HOSPITAL_COMMUNITY): Payer: Medicare Other

## 2014-08-15 ENCOUNTER — Other Ambulatory Visit: Payer: Self-pay | Admitting: Nephrology

## 2014-08-15 DIAGNOSIS — R3129 Other microscopic hematuria: Secondary | ICD-10-CM

## 2014-08-18 ENCOUNTER — Encounter (HOSPITAL_COMMUNITY): Payer: Medicare Other

## 2014-08-18 DIAGNOSIS — Z87442 Personal history of urinary calculi: Secondary | ICD-10-CM | POA: Diagnosis not present

## 2014-08-18 DIAGNOSIS — E291 Testicular hypofunction: Secondary | ICD-10-CM | POA: Diagnosis not present

## 2014-08-18 DIAGNOSIS — Z8546 Personal history of malignant neoplasm of prostate: Secondary | ICD-10-CM | POA: Diagnosis not present

## 2014-08-20 ENCOUNTER — Encounter (HOSPITAL_COMMUNITY): Payer: Medicare Other

## 2014-08-21 ENCOUNTER — Encounter: Payer: Self-pay | Admitting: Cardiovascular Disease

## 2014-08-22 ENCOUNTER — Encounter (HOSPITAL_COMMUNITY): Payer: Medicare Other

## 2014-08-22 ENCOUNTER — Ambulatory Visit
Admission: RE | Admit: 2014-08-22 | Discharge: 2014-08-22 | Disposition: A | Discharge status: Home / Self Care | Payer: Medicare Other | Source: Ambulatory Visit | Attending: Nephrology | Admitting: Nephrology

## 2014-08-22 DIAGNOSIS — N281 Cyst of kidney, acquired: Secondary | ICD-10-CM | POA: Diagnosis not present

## 2014-08-22 DIAGNOSIS — R3129 Other microscopic hematuria: Secondary | ICD-10-CM

## 2014-08-22 MED ORDER — IOHEXOL 300 MG/ML  SOLN
125.0000 mL | Freq: Once | INTRAMUSCULAR | Status: AC | PRN
  Administered 2014-08-22: 125 mL via INTRAVENOUS

## 2014-08-25 ENCOUNTER — Encounter (HOSPITAL_COMMUNITY): Payer: Medicare Other

## 2014-08-27 ENCOUNTER — Encounter (HOSPITAL_COMMUNITY): Payer: Medicare Other

## 2014-08-28 ENCOUNTER — Other Ambulatory Visit: Payer: Self-pay | Admitting: Internal Medicine

## 2014-08-28 ENCOUNTER — Ambulatory Visit (INDEPENDENT_AMBULATORY_CARE_PROVIDER_SITE_OTHER): Payer: Medicare Other

## 2014-08-28 DIAGNOSIS — Z5181 Encounter for therapeutic drug level monitoring: Secondary | ICD-10-CM

## 2014-08-28 DIAGNOSIS — I4891 Unspecified atrial fibrillation: Secondary | ICD-10-CM | POA: Diagnosis not present

## 2014-08-28 LAB — POCT INR: INR: 2.4

## 2014-08-29 ENCOUNTER — Other Ambulatory Visit: Payer: Medicare Other

## 2014-08-29 ENCOUNTER — Encounter (HOSPITAL_COMMUNITY): Payer: Medicare Other

## 2014-08-29 DIAGNOSIS — E1129 Type 2 diabetes mellitus with other diabetic kidney complication: Secondary | ICD-10-CM | POA: Diagnosis not present

## 2014-08-29 DIAGNOSIS — E785 Hyperlipidemia, unspecified: Secondary | ICD-10-CM

## 2014-08-29 DIAGNOSIS — D72829 Elevated white blood cell count, unspecified: Secondary | ICD-10-CM | POA: Diagnosis not present

## 2014-08-29 DIAGNOSIS — E876 Hypokalemia: Secondary | ICD-10-CM | POA: Diagnosis not present

## 2014-08-29 DIAGNOSIS — Z79899 Other long term (current) drug therapy: Secondary | ICD-10-CM | POA: Diagnosis not present

## 2014-08-29 DIAGNOSIS — D649 Anemia, unspecified: Secondary | ICD-10-CM

## 2014-08-30 LAB — COMPREHENSIVE METABOLIC PANEL
ALT: 19 IU/L (ref 0–44)
AST: 15 IU/L (ref 0–40)
Albumin/Globulin Ratio: 1.5 (ref 1.1–2.5)
Albumin: 4 g/dL (ref 3.5–4.8)
Alkaline Phosphatase: 63 IU/L (ref 39–117)
BUN/Creatinine Ratio: 18 (ref 10–22)
BUN: 18 mg/dL (ref 8–27)
CO2: 25 mmol/L (ref 18–29)
Calcium: 9.1 mg/dL (ref 8.6–10.2)
Chloride: 102 mmol/L (ref 97–108)
Creatinine, Ser: 1.02 mg/dL (ref 0.76–1.27)
GFR calc Af Amer: 84 mL/min/{1.73_m2} (ref >59)
GFR calc non Af Amer: 73 mL/min/{1.73_m2} (ref >59)
Globulin, Total: 2.7 g/dL (ref 1.5–4.5)
Glucose: 149 mg/dL — ABNORMAL HIGH (ref 65–99)
Potassium: 3.9 mmol/L (ref 3.5–5.2)
Sodium: 142 mmol/L (ref 134–144)
Total Bilirubin: 0.6 mg/dL (ref 0.0–1.2)
Total Protein: 6.7 g/dL (ref 6.0–8.5)

## 2014-08-30 LAB — CBC WITH DIFFERENTIAL/PLATELET
Basophils Absolute: 0 10*3/uL (ref 0.0–0.2)
Basos: 0 %
Eos: 1 %
Eosinophils Absolute: 0.1 10*3/uL (ref 0.0–0.4)
HCT: 37.2 % — ABNORMAL LOW (ref 37.5–51.0)
Hemoglobin: 12.1 g/dL — ABNORMAL LOW (ref 12.6–17.7)
Immature Grans (Abs): 0 10*3/uL (ref 0.0–0.1)
Immature Granulocytes: 0 %
Lymphocytes Absolute: 1.4 10*3/uL (ref 0.7–3.1)
Lymphs: 18 %
MCH: 26.5 pg — ABNORMAL LOW (ref 26.6–33.0)
MCHC: 32.5 g/dL (ref 31.5–35.7)
MCV: 82 fL (ref 79–97)
Monocytes Absolute: 0.8 10*3/uL (ref 0.1–0.9)
Monocytes: 10 %
Neutrophils Absolute: 5.3 10*3/uL (ref 1.4–7.0)
Neutrophils Relative %: 71 %
RBC: 4.56 x10E6/uL (ref 4.14–5.80)
RDW: 17.1 % — ABNORMAL HIGH (ref 12.3–15.4)
WBC: 7.6 10*3/uL (ref 3.4–10.8)

## 2014-08-30 LAB — TSH: TSH: 2.89 u[IU]/mL (ref 0.450–4.500)

## 2014-08-30 LAB — HEMOGLOBIN A1C
Est. average glucose Bld gHb Est-mCnc: 157 mg/dL
Hgb A1c MFr Bld: 7.1 % — ABNORMAL HIGH (ref 4.8–5.6)

## 2014-09-02 ENCOUNTER — Ambulatory Visit (INDEPENDENT_AMBULATORY_CARE_PROVIDER_SITE_OTHER): Payer: Medicare Other | Admitting: Internal Medicine

## 2014-09-02 ENCOUNTER — Ambulatory Visit
Admission: RE | Admit: 2014-09-02 | Discharge: 2014-09-02 | Disposition: A | Discharge status: Home / Self Care | Payer: Medicare Other | Source: Ambulatory Visit | Attending: Internal Medicine | Admitting: Internal Medicine

## 2014-09-02 ENCOUNTER — Encounter: Payer: Self-pay | Admitting: Internal Medicine

## 2014-09-02 VITALS — BP 154/80 | HR 83 | Temp 97.7°F | Resp 12 | Ht 68.0 in | Wt 241.0 lb

## 2014-09-02 DIAGNOSIS — I4891 Unspecified atrial fibrillation: Secondary | ICD-10-CM | POA: Diagnosis not present

## 2014-09-02 DIAGNOSIS — E785 Hyperlipidemia, unspecified: Secondary | ICD-10-CM

## 2014-09-02 DIAGNOSIS — E1129 Type 2 diabetes mellitus with other diabetic kidney complication: Secondary | ICD-10-CM

## 2014-09-02 DIAGNOSIS — I10 Essential (primary) hypertension: Secondary | ICD-10-CM | POA: Diagnosis not present

## 2014-09-02 DIAGNOSIS — R0600 Dyspnea, unspecified: Secondary | ICD-10-CM

## 2014-09-02 DIAGNOSIS — J90 Pleural effusion, not elsewhere classified: Secondary | ICD-10-CM | POA: Diagnosis not present

## 2014-09-02 DIAGNOSIS — R0989 Other specified symptoms and signs involving the circulatory and respiratory systems: Secondary | ICD-10-CM

## 2014-09-02 DIAGNOSIS — R0609 Other forms of dyspnea: Secondary | ICD-10-CM | POA: Diagnosis not present

## 2014-09-02 DIAGNOSIS — I482 Chronic atrial fibrillation, unspecified: Secondary | ICD-10-CM

## 2014-09-02 DIAGNOSIS — I200 Unstable angina: Secondary | ICD-10-CM | POA: Diagnosis not present

## 2014-09-02 NOTE — Progress Notes (Signed)
Patient ID: Seth [name], male   DOB: Feb 21, 1941, 73 y.o.   MRN: [id number]    No Known Allergies  Chief Complaint  Patient presents with  . Medical Management of Chronic Issues    6 week follow-up   . Medication Management    Discuss if patient should be on 10 or 20 mg of Lipitor    HPI 73 y/o male patient is here for follow up on his routine health issues.  His blood pressure is elevated this visit. He mentions Sbp at home 120-130 and not above 140s. He has taken all his medications this am He does not have his cbg reading with him cbg reading this am was 130 His blood count has improved as he was noted to be anemic in the past He complaints of being short of breath for a month- both at rest and exertion. He is having difficulty breathing when he lays down flat.  Denies chest pain, leg swelling, nausea or vomiting He has history of CAD, AFIB, HTN, DM  Review of Systems  Constitutional: Negative for fever, chills, diaphoresis.  HENT: Negative for congestion Respiratory: Negative for cough, sputum production, wheezing.   Cardiovascular: Negative for chest pain, palpitations Gastrointestinal: Negative for heartburn, nausea, vomiting, abdominal pain.  Genitourinary: Negative for dysuria Skin: Negative for itching and rash.  Neurological: Negative for dizziness Psychiatric/Behavioral: Negative for depression   Past Medical History  Diagnosis Date  . Impotence of organic origin   . CKD (chronic kidney disease)     proteinuria  . Type II or unspecified type diabetes mellitus with renal manifestations, uncontrolled   . Peripheral vascular disease, unspecified   . HTN (hypertension)   . Hemorrhage of rectum and anus   . Prostate cancer   . Thrombocytopenia, unspecified   . Tobacco use disorder   . Nephrolithiasis   . Hemorrhage of gastrointestinal tract, unspecified   . HLD (hyperlipidemia)   . CAD (coronary artery disease)     a.  Lexiscan Myoview (02/14/14):  Apical,  inferolateral scar; ? Mild ischemia; EF 32%.;  b.  LHC (02/21/14):  dLM 100%, LAD 100%, CFX 100%, dRCA 50%. => CABG (L-LAD, S-OM, S-PDA)  . Ischemic cardiomyopathy     a. Echo (02/17/14):  EF 35-40%, anteroseptal and apical AK, Gr 2 DD, MAC, mod LAE.  [name] Chronic systolic heart failure   . Atrial fibrillation     a. coumadin;  b. Amiodarone   Medication reviewed. See [facility]  Physical exam BP 154/80  Pulse 83  Temp(Src) 97.7 F (36.5 C) (Oral)  Resp 12  Ht 5\' 8"  (1.727 m)  Wt 241 lb (109.317 kg)  BMI 36.65 kg/m2  SpO2 96%  Wt Readings from Last 3 Encounters:  09/02/14 241 lb (109.317 kg)  07/31/14 242 lb (109.77 kg)  07/22/14 241 lb 9.6 oz (109.589 kg)    General- elderly male in no acute distress, overweight Head- atraumatic, normocephalic Eyes- PERRLA, EOMI, no pallor, no icterus, no discharge Neck- no lymphadenopathy, no thyromegaly, no jugular vein distension Cardiovascular- normal s1,s2, no murmurs/ rubs/ gallops, normal distal pulses Respiratory- bilateral clear to auscultation, no wheeze, no rhonchi, no crackles Abdomen- bowel sounds present, soft, non tender Musculoskeletal- able to move all 4 extremities, normal range of motion, trace leg edema Neurological- no focal deficit Skin- warm and dry Psychiatry- alert and oriented to person, place and time, normal mood and affect    Lab Results  Component Value Date   HGBA1C 7.1* 08/29/2014   CMP  Component Value Date/Time   NA 142 08/29/2014 0845   NA 139 03/24/2014 0930   K 3.9 08/29/2014 0845   CL 102 08/29/2014 0845   CO2 25 08/29/2014 0845   GLUCOSE 149* 08/29/2014 0845   GLUCOSE 133* 03/24/2014 0930   BUN 18 08/29/2014 0845   BUN 18 03/24/2014 0930   CREATININE 1.02 08/29/2014 0845   CALCIUM 9.1 08/29/2014 0845   PROT 6.7 08/29/2014 0845   PROT 7.1 02/23/2014 1646   ALBUMIN 3.5 02/23/2014 1646   AST 15 08/29/2014 0845   ALT 19 08/29/2014 0845   ALKPHOS 63 08/29/2014 0845   BILITOT 0.6 08/29/2014 0845   GFRNONAA 73 08/29/2014 0845    GFRAA 84 08/29/2014 0845   Lab Results  Component Value Date   TSH 2.890 08/29/2014   Lab Results  Component Value Date   CREATININE 1.02 08/29/2014   Lipid Panel     Component Value Date/Time   TRIG 80 05/23/2014 0841   HDL 45 05/23/2014 0841   CHOLHDL 2.2 05/23/2014 0841   LDLCALC 40 05/23/2014 0841   Imaging  Echo 05/28/14: Left ventricle: The cavity size was normal. There was moderate concentric hypertrophy. Systolic function was moderately reduced. The estimated ejection fraction was in the range of 35% to 40%. There is akinesis of the mid-apicalanterior, inferoseptal, and apical myocardium. Features are consistent with a pseudonormal left ventricular filling pattern, with concomitant abnormal relaxation and increased filling pressure (grade 2 diastolic dysfunction). - Mitral valve: There was mild regurgitation. - Left atrium: The atrium was mildly to moderately dilated. - Right atrium: The atrium was mildly dilated.  Assessment/plan  1. Dyspnea New onset, has history of CAD and echocardiogram from 6/15 s/o low EF with both systolic and diastolic dysfunction. He could be having worsening of his heart failure causing these symptoms. Check BNP. Also has effusion seen on recent ct abdomen. Will get lateral decubitus film of chest to assess for amount of effusion as this can also be contributing to his dyspnea. Low probability for PE but will get d-dimer to assess for need of further investigation. Continue coreg, lasix and lisinopril for now. Will increase dose of lasix to 40 mg bid for now. - Brain Natriuretic Peptide - D-dimer, Quantitative - DG Chest Bilateral Decubitus; Future - DG Chest 2 View; Future  2. Type 2 diabetes mellitus with other diabetic kidney complication Continue glipizide and metformin current regimen. Continue asa, lipitor and lisinopril. Monitor cbg with rise in a1c from 6.4 to 7.1. - Hemoglobin A1c; Future - Basic Metabolic Panel; Future  3. Chronic atrial  fibrillation Rate controlled at present, continue coreg and coumadin  4. Hypertension, benign Elevated today but as per pt has been in range at home. Continue lasix, coreg and lisinopril  5. Hyperlipidemia Continue lipitor 20 mg daily

## 2014-09-03 LAB — SPECIMEN STATUS REPORT

## 2014-09-05 LAB — BRAIN NATRIURETIC PEPTIDE: BNP: 680.5 pg/mL — ABNORMAL HIGH (ref 0.0–100.0)

## 2014-09-05 LAB — D-DIMER, QUANTITATIVE

## 2014-09-11 ENCOUNTER — Ambulatory Visit (INDEPENDENT_AMBULATORY_CARE_PROVIDER_SITE_OTHER): Payer: Medicare Other | Admitting: Cardiology

## 2014-09-11 ENCOUNTER — Ambulatory Visit (INDEPENDENT_AMBULATORY_CARE_PROVIDER_SITE_OTHER): Payer: Medicare Other | Admitting: Pharmacist

## 2014-09-11 ENCOUNTER — Ambulatory Visit
Admission: RE | Admit: 2014-09-11 | Discharge: 2014-09-11 | Disposition: A | Discharge status: Home / Self Care | Payer: Medicare Other | Source: Ambulatory Visit | Attending: Cardiology | Admitting: Cardiology

## 2014-09-11 VITALS — BP 130/86 | HR 66 | Ht 70.0 in | Wt 238.0 lb

## 2014-09-11 DIAGNOSIS — Z5181 Encounter for therapeutic drug level monitoring: Secondary | ICD-10-CM | POA: Diagnosis not present

## 2014-09-11 DIAGNOSIS — R0602 Shortness of breath: Secondary | ICD-10-CM | POA: Diagnosis not present

## 2014-09-11 DIAGNOSIS — I4891 Unspecified atrial fibrillation: Secondary | ICD-10-CM | POA: Diagnosis not present

## 2014-09-11 DIAGNOSIS — Z951 Presence of aortocoronary bypass graft: Secondary | ICD-10-CM | POA: Diagnosis not present

## 2014-09-11 DIAGNOSIS — I200 Unstable angina: Secondary | ICD-10-CM | POA: Diagnosis not present

## 2014-09-11 DIAGNOSIS — I517 Cardiomegaly: Secondary | ICD-10-CM | POA: Diagnosis not present

## 2014-09-11 DIAGNOSIS — J90 Pleural effusion, not elsewhere classified: Secondary | ICD-10-CM | POA: Diagnosis not present

## 2014-09-11 LAB — POCT INR: INR: 2.2

## 2014-09-11 LAB — BRAIN NATRIURETIC PEPTIDE: Pro B Natriuretic peptide (BNP): 560 pg/mL — ABNORMAL HIGH (ref 0.0–100.0)

## 2014-09-11 LAB — BASIC METABOLIC PANEL
BUN: 24 mg/dL — ABNORMAL HIGH (ref 6–23)
CO2: 32 mEq/L (ref 19–32)
Calcium: 9.3 mg/dL (ref 8.4–10.5)
Chloride: 102 mEq/L (ref 96–112)
Creatinine, Ser: 1.2 mg/dL (ref 0.4–1.5)
GFR: 63.22 mL/min (ref >60.00)
Glucose, Bld: 258 mg/dL — ABNORMAL HIGH (ref 70–99)
Potassium: 4.2 mEq/L (ref 3.5–5.1)
Sodium: 142 mEq/L (ref 135–145)

## 2014-09-11 NOTE — Progress Notes (Signed)
Seth [name] Date of Birth:  1941-12-12 Seth [name] [name], [name]  [address] [phone]        Fax   [phone]   History of Present Illness: This pleasant 73 year old gentleman is seen as a DOD work and evaluation.  The patient was here for his prothrombin time and his wife was very concerned about his recent diagnosis of congestive heart failure.  The patient has a past history of coronary artery bypass surgery on 02/23/14 by Dr. Cyndia Bent.  He has a past history of atrial fibrillation and is on long-term Coumadin.  His echocardiogram on 05/28/14 showed an ejection fraction of 35-40%. He is currently in sinus rhythm.  He has a past history of intolerance to amiodarone which was stopped at a recent office visit with [facility].  The patient was recently seen by his PCP Dr. Bubba Camp.  The patient was complaining of a increasing dyspnea.  A chest x-ray showed small bilateral pleural effusions left greater than right.  His BNP was elevated at 680.  He was advised to increase his furosemide to 40 mg twice a day.  He has diuresed well.  His weight is down 4 pounds.  His breathing is improved.  He had been experiencing orthopnea and paroxysmal nocturnal dyspnea which has improved.  His peripheral edema has resolved.  He has not been experiencing any recurrent chest pain or angina.  Current Outpatient Prescriptions  Medication Sig Dispense Refill  . aspirin EC 81 MG tablet Take 1 tablet (81 mg total) by mouth daily.      [name] atorvastatin (LIPITOR) 20 MG tablet Take 40 mg by mouth daily.       . Calcium Carbonate-Vitamin D 600-200 MG-UNIT TABS Take 1 tablet by mouth daily. For bones       . carvedilol (COREG) 6.25 MG tablet Take 1 tablet (6.25 mg total) by mouth 2 (two) times daily with a meal.  180 tablet  3  . Flaxseed, Linseed, (FLAXSEED OIL PO) Take 1 tablet by mouth daily.       . furosemide (LASIX) 40 MG tablet Take 40 mg by mouth 2 (two) times daily.      [name]  glipiZIDE (GLUCOTROL) 5 MG tablet Take 2.5 mg by mouth daily before breakfast. 1/2 tablet      . lisinopril (PRINIVIL,ZESTRIL) 10 MG tablet Take 1 tablet (10 mg total) by mouth daily.  90 tablet  3  . metFORMIN (GLUCOPHAGE) 1000 MG tablet TAKE 1 TABLET BY MOUTH TWICE A DAY FOR DIABETES  60 tablet  5  . Multiple Vitamin (MULTIVITAMINS PO) Take 1 tablet by mouth daily.        . potassium chloride SA (K-DUR,KLOR-CON) 10 MEQ tablet Take 1 tablet (10 mEq total) by mouth daily.  90 tablet  1  . warfarin (COUMADIN) 5 MG tablet Take 1 tablet (5 mg total) by mouth daily. Or as directed by [facility]  60 tablet  3   No current facility-administered medications for this visit.    No Known Allergies  Patient Active Problem List   Diagnosis Date Noted  . Type 2 diabetes mellitus with vascular disease 05/27/2014  . Anemia 03/18/2014  . Leukocytosis 03/18/2014  . Encounter for therapeutic drug monitoring 03/17/2014  . Atrial fibrillation 02/28/2014  . S/P CABG x 3 02/24/2014  . Unstable angina 02/21/2014  . Obesity, morbid 05/22/2013  . Other malaise and fatigue 05/22/2013  . CKD (chronic kidney disease) 05/22/2013  .  Other and unspecified hyperlipidemia 05/22/2013  . Overweight 03/16/2011  . Type II or unspecified type diabetes mellitus with renal manifestations, uncontrolled 09/14/2010  . Hypertension, benign 06/10/2010  . PVD (peripheral vascular disease) 05/12/2010  . Rectal bleeding 05/12/2010  . Routine general medical examination at a health care facility 05/12/2010  . Prostate cancer 02/10/2010  . Thrombocytopenia 02/10/2010  . Type II or unspecified type diabetes mellitus with renal manifestations, not stated as uncontrolled 11/11/2009  . Obesity, unspecified 11/11/2009  . Tobacco user 11/11/2009  . Proteinuria 09/30/2009  . Impotence, organic 10/08/2007  . Personal history of malignant neoplasm of prostate 10/08/2007  . Personal history of urinary calculi 10/08/2007  . Type II  or unspecified type diabetes mellitus without mention of complication, uncontrolled 09/04/2007  . Type II or unspecified type diabetes mellitus without mention of complication, not stated as uncontrolled 11/28/2006  . Stress hyperglycemia 09/21/2006  . Other chest pain 09/08/2006  . Slowing of urinary stream 09/08/2006  . Encounter for long-term (current) use of medications 09/08/2006  . HTN (hypertension) 10/27/2005  . Rash 06/22/2004  . Hemorrhage of gastrointestinal tract, unspecified 02/23/2004  . Coronary atherosclerosis of unspecified type of vessel, native or graft 02/04/2000  . Hyperlipidemia 04/26/1999  . Urolithiasis 12/26/1997    History  Smoking status  . Never Smoker   Smokeless tobacco  . Former Systems developer  . Types: Chew  . Quit date: 02/23/2014    History  Alcohol Use No    Family History  Problem Relation Age of Onset  . Cancer Mother 25    leukemia  . Heart disease Father   . Heart attack Father 42    Review of Systems: Constitutional: no fever chills diaphoresis or fatigue or change in weight.  Head and neck: no hearing loss, no epistaxis, no photophobia or visual disturbance. Respiratory: Positive for shortness of breath Cardiovascular: No chest pain peripheral edema, palpitations. Gastrointestinal: No abdominal distention, no abdominal pain, no change in bowel habits hematochezia or melena. Genitourinary: No dysuria, no frequency, no urgency, no nocturia. Musculoskeletal:No arthralgias, no back pain, no gait disturbance or myalgias. Neurological: No dizziness, no headaches, no numbness, no seizures, no syncope, no weakness, no tremors. Hematologic: No lymphadenopathy, no easy bruising. Psychiatric: No confusion, no hallucinations, no sleep disturbance.    Physical Exam: Filed Vitals:   09/11/14 1007  BP: 130/86  Pulse: 66  The patient appears to be in no distress.  Head and neck exam reveals that the pupils are equal and reactive.  The extraocular  movements are full.  There is no scleral icterus.  Mouth and pharynx are benign.  No lymphadenopathy.  No carotid bruits.  The jugular venous pressure is normal.  Thyroid is not enlarged or tender.  Chest is clear to percussion and auscultation.  No rales or rhonchi.  Expansion of the chest is symmetrical.  Heart reveals no abnormal lift or heave.  First and second heart sounds are normal.  The pulse is regular.  There is no murmur gallop rub or click.  The abdomen is soft and nontender.  Bowel sounds are normoactive.  There is no hepatosplenomegaly or mass.  There are no abdominal bruits.  Extremities reveal no phlebitis or edema.  Pedal pulses are good.  There is no cyanosis or clubbing.  Neurologic exam is normal strength and no lateralizing weakness.  No sensory deficits.  Integument reveals no rash    Assessment / Plan: 1.  Chronic systolic heart failure with recent exacerbation, responding to increase  in Lasix 2. ischemic heart disease status post CABG on 02/23/14 3. mild mitral regurgitation 4. past history of atrial fibrillation on long-term Coumadin  Disposition: We are sending him for a followup chest x-ray today.  We are repeating a basal metabolic panel and B. natruretic peptide.  I would expect these to be improved.  He will continue current dose of Lasix 40 mg twice a day. He will keep his appointment with [name] at the end of the month.

## 2014-09-11 NOTE — Patient Instructions (Signed)
Will obtain labs today and call you with the results (bmet/bnp)  A chest x-ray takes a picture of the organs and structures inside the chest, including the heart, lungs, and blood vessels. This test can show several things, including, whether the heart is enlarges; whether fluid is building up in the lungs; and whether pacemaker / defibrillator leads are still in place. Seth [name]  Your physician recommends that you continue on your current medications as directed. Please refer to the Current Medication list given to you today.  Keep your appointment with Dr Angelena Form

## 2014-09-23 ENCOUNTER — Encounter: Payer: Self-pay | Admitting: Cardiovascular Disease

## 2014-09-23 ENCOUNTER — Other Ambulatory Visit: Payer: Self-pay | Admitting: *Deleted

## 2014-09-23 ENCOUNTER — Ambulatory Visit (INDEPENDENT_AMBULATORY_CARE_PROVIDER_SITE_OTHER): Payer: Medicare Other | Admitting: Cardiovascular Disease

## 2014-09-23 VITALS — BP 140/86 | HR 79 | Ht 70.0 in | Wt 245.0 lb

## 2014-09-23 DIAGNOSIS — I4891 Unspecified atrial fibrillation: Secondary | ICD-10-CM

## 2014-09-23 DIAGNOSIS — I5022 Chronic systolic (congestive) heart failure: Secondary | ICD-10-CM | POA: Diagnosis not present

## 2014-09-23 DIAGNOSIS — I10 Essential (primary) hypertension: Secondary | ICD-10-CM

## 2014-09-23 DIAGNOSIS — I2510 Atherosclerotic heart disease of native coronary artery without angina pectoris: Secondary | ICD-10-CM

## 2014-09-23 DIAGNOSIS — E785 Hyperlipidemia, unspecified: Secondary | ICD-10-CM | POA: Diagnosis not present

## 2014-09-23 DIAGNOSIS — R0602 Shortness of breath: Secondary | ICD-10-CM

## 2014-09-23 DIAGNOSIS — I2589 Other forms of chronic ischemic heart disease: Secondary | ICD-10-CM | POA: Diagnosis not present

## 2014-09-23 DIAGNOSIS — I255 Ischemic cardiomyopathy: Secondary | ICD-10-CM

## 2014-09-23 DIAGNOSIS — I480 Paroxysmal atrial fibrillation: Secondary | ICD-10-CM

## 2014-09-23 DIAGNOSIS — I200 Unstable angina: Secondary | ICD-10-CM

## 2014-09-23 LAB — BASIC METABOLIC PANEL
BUN: 15 mg/dL (ref 6–23)
CO2: 32 mEq/L (ref 19–32)
Calcium: 9.3 mg/dL (ref 8.4–10.5)
Chloride: 102 mEq/L (ref 96–112)
Creatinine, Ser: 1 mg/dL (ref 0.4–1.5)
GFR: 75.4 mL/min (ref >60.00)
Glucose, Bld: 159 mg/dL — ABNORMAL HIGH (ref 70–99)
Potassium: 3.8 mEq/L (ref 3.5–5.1)
Sodium: 138 mEq/L (ref 135–145)

## 2014-09-23 LAB — BRAIN NATRIURETIC PEPTIDE: Pro B Natriuretic peptide (BNP): 642 pg/mL — ABNORMAL HIGH (ref 0.0–100.0)

## 2014-09-23 MED ORDER — FUROSEMIDE 40 MG PO TABS: ORAL_TABLET | ORAL | Status: DC

## 2014-09-23 NOTE — Progress Notes (Signed)
History of Present Illness: 73 y.o. male with a hx of DM, HTN, HLD, CAD, prostate CA, renal insufficiency, obesity here today for cardiac follow up. He was seen as a new patient 02/12/14 for evaluation of atrial fib. He was placed on Xarelto. He was set up for echo and stress test. Stress test was abnormal with apical and inferolateral scar and ischemia, LVEF of 32%. Echo 02/17/14 with LVEF=35-40% with akinesis of the mid-distalanteroseptal and apical myocardium, MAC, dilated LA. Cardiac cath 02/21/14 with occluded left main, moderate RCA stenosis with the entire left system filling from right to left collaterals. 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 He was discharged on amiodarone and coumadin. Echo 05/28/14 with LVEF=35-40%, concentric LVH, mild MR. Seen by Dr. Mare Ferrari as DOD add on for w/u of worsened SOB, noted while in coumadin clinic. Primary care workup included chest x-ray which showed small bilateral pleural effusions left greater than right. His BNP was elevated at 680. He was advised to increase his furosemide to 40 mg twice a day and diuresed well with appropriate weight loss. His breathing and orthopnea improved.   He is here today for follow up. He tells me that he feels better since having Lasix increased. No chest pain. Orthopnea resolved. LE edema resolved. Weighing daily and stable weights at home.   Primary Care Physician: Bubba Camp  Last Lipid Profile:Lipid Panel     Component Value Date/Time   TRIG 80 05/23/2014 0841   HDL 45 05/23/2014 0841   CHOLHDL 2.2 05/23/2014 0841   LDLCALC 40 05/23/2014 0841     Past Medical History  Diagnosis Date  . Impotence of organic origin   . CKD (chronic kidney disease)     proteinuria  . Type II or unspecified type diabetes mellitus with renal manifestations, uncontrolled   . Peripheral vascular disease, unspecified   . HTN (hypertension)   . Hemorrhage of rectum and anus   . Prostate cancer   .  Thrombocytopenia, unspecified   . Tobacco use disorder   . Nephrolithiasis   . Hemorrhage of gastrointestinal tract, unspecified   . HLD (hyperlipidemia)   . CAD (coronary artery disease)     a.  Lexiscan Myoview (02/14/14):  Apical, inferolateral scar; ? Mild ischemia; EF 32%.;  b.  LHC (02/21/14):  dLM 100%, LAD 100%, CFX 100%, dRCA 50%. => CABG (L-LAD, S-OM, S-PDA)  . Ischemic cardiomyopathy     a. Echo (02/17/14):  EF 35-40%, anteroseptal and apical AK, Gr 2 DD, MAC, mod LAE.  [name] Chronic systolic heart failure   . Atrial fibrillation     a. coumadin;  b. Amiodarone    Past Surgical History  Procedure Laterality Date  . Back surgery    . Cataract extraction  2010  . Coronary artery bypass graft N/A 02/24/2014    Procedure: Coronary artery bypass graft times three using left internal mammary artery and left leg greater saphenous vein harvested endoscopically.;  Surgeon: Gaye Pollack, MD;  Location: [facility] OR;  Service: Open Heart Surgery;  Laterality: N/A;  . Intraoperative transesophageal echocardiogram N/A 02/24/2014    Procedure: INTRAOPERATIVE TRANSESOPHAGEAL ECHOCARDIOGRAM;  Surgeon: Gaye Pollack, MD;  [facility] OR;  Service: Open Heart Surgery;  Laterality: N/A;  . Clipping of atrial appendage Left 02/24/2014    Procedure: CLIPPING OF ATRIAL APPENDAGE;  Surgeon: Gaye Pollack, MD;  Location: [place];  Service: Open Heart Surgery;  Laterality: Left;  .  Colonoscopy  2005    Normal    Current Outpatient Prescriptions  Medication Sig Dispense Refill  . aspirin EC 81 MG tablet Take 1 tablet (81 mg total) by mouth daily.      [name] atorvastatin (LIPITOR) 20 MG tablet Take 40 mg by mouth daily.       . Calcium Carbonate-Vitamin D 600-200 MG-UNIT TABS Take 1 tablet by mouth daily. For bones       . carvedilol (COREG) 6.25 MG tablet Take 1 tablet (6.25 mg total) by mouth 2 (two) times daily with a meal.  180 tablet  3  . Flaxseed, Linseed, (FLAXSEED OIL PO) Take 1 tablet by mouth daily.       .  furosemide (LASIX) 40 MG tablet Take 40 mg by mouth 2 (two) times daily.      [name] glipiZIDE (GLUCOTROL) 5 MG tablet Take 2.5 mg by mouth daily before breakfast. 1/2 tablet      . lisinopril (PRINIVIL,ZESTRIL) 10 MG tablet Take 1 tablet (10 mg total) by mouth daily.  90 tablet  3  . metFORMIN (GLUCOPHAGE) 1000 MG tablet TAKE 1 TABLET BY MOUTH TWICE A DAY FOR DIABETES  60 tablet  5  . Multiple Vitamin (MULTIVITAMINS PO) Take 1 tablet by mouth daily.        . potassium chloride SA (K-DUR,KLOR-CON) 10 MEQ tablet Take 1 tablet (10 mEq total) by mouth daily.  90 tablet  1  . warfarin (COUMADIN) 5 MG tablet Take 1 tablet (5 mg total) by mouth daily. Or as directed by Coumadin Clinic  60 tablet  3   No current facility-administered medications for this visit.    No Known Allergies  History   Social History  . Marital Status: Married    Spouse Name: N/A    Number of Children: 3  . Years of Education: N/A   Occupational History  . Sell storage buildings    Social History Main Topics  . Smoking status: Never Smoker   . Smokeless tobacco: Former Systems developer    Types: Chew    Quit date: 02/23/2014  . Alcohol Use: No  . Drug Use: No  . Sexual Activity: Not on file   Other Topics Concern  . Not on file   Social History Narrative  . No narrative on file    Family History  Problem Relation Age of Onset  . Cancer Mother 67    leukemia  . Heart disease Father   . Heart attack Father 15    Review of Systems:  As stated in the HPI and otherwise negative.   BP 140/86  Pulse 79  Ht 5\' 10"  (1.778 m)  Wt 245 lb (111.131 kg)  BMI 35.15 kg/m2  SpO2 96%  Physical Examination: General: Well developed, well nourished, NAD HEENT: OP clear, mucus membranes moist SKIN: warm, dry. No rashes. Neuro: No focal deficits Musculoskeletal: Muscle strength 5/5 all ext Psychiatric: Mood and affect normal Neck: No JVD, no carotid bruits, no thyromegaly, no lymphadenopathy. Lungs:Clear bilaterally, no  wheezes, rhonci, crackles Cardiovascular: Regular rate and rhythm. No murmurs, gallops or rubs. Abdomen:Soft. Bowel sounds present. Non-tender.  Extremities: No lower extremity edema. Pulses are 2 + in the bilateral DP/PT  Echo 05/28/14: Left ventricle: The cavity size was normal. There was moderate concentric hypertrophy. Systolic function was moderately reduced. The estimated ejection fraction was in the range of 35% to 40%. There is akinesis of the mid-apicalanterior, inferoseptal, and apical myocardium. Features are consistent with a pseudonormal  left ventricular filling pattern, with concomitant abnormal relaxation and increased filling pressure (grade 2 diastolic dysfunction). - Mitral valve: There was mild regurgitation. - Left atrium: The atrium was mildly to moderately dilated. - Right atrium: The atrium was mildly dilated.  Assessment and Plan:   1. CAD: s/p CABG. Stable. Continue current meds.   2. Atrial fibrillation: Sinus today. Continue Coreg for rate control and coumadin.   3. Ischemic cardiomyopathy: LVEF=35-40% by echo June 2015. Will continue medical management    4. Chronic systolic CHF: Volume status is ok today. Still has mild basilar crackles. I discussed increasing Lasix but he does not wish to do this. Continue Lasix 40 mg po BID. Check BMET and BNP today  5. HTN: BP controlled. This is being managed in Nephrology  6. HLD: Lipids controlled. Continue statin.

## 2014-09-23 NOTE — Patient Instructions (Signed)
Your physician recommends that you schedule a follow-up appointment in:  3 months   Lab work to be done today--BMP,BNP

## 2014-09-30 ENCOUNTER — Ambulatory Visit (INDEPENDENT_AMBULATORY_CARE_PROVIDER_SITE_OTHER): Payer: Medicare Other | Admitting: Internal Medicine

## 2014-09-30 ENCOUNTER — Encounter: Payer: Self-pay | Admitting: Internal Medicine

## 2014-09-30 VITALS — BP 130/80 | HR 96 | Temp 98.5°F | Resp 18 | Ht 70.0 in | Wt 240.4 lb

## 2014-09-30 DIAGNOSIS — I255 Ischemic cardiomyopathy: Secondary | ICD-10-CM | POA: Diagnosis not present

## 2014-09-30 DIAGNOSIS — I2510 Atherosclerotic heart disease of native coronary artery without angina pectoris: Secondary | ICD-10-CM

## 2014-09-30 DIAGNOSIS — E785 Hyperlipidemia, unspecified: Secondary | ICD-10-CM

## 2014-09-30 DIAGNOSIS — Z0000 Encounter for general adult medical examination without abnormal findings: Secondary | ICD-10-CM

## 2014-09-30 DIAGNOSIS — I5043 Acute on chronic combined systolic (congestive) and diastolic (congestive) heart failure: Secondary | ICD-10-CM

## 2014-09-30 DIAGNOSIS — E1143 Type 2 diabetes mellitus with diabetic autonomic (poly)neuropathy: Secondary | ICD-10-CM

## 2014-09-30 DIAGNOSIS — I10 Essential (primary) hypertension: Secondary | ICD-10-CM | POA: Diagnosis not present

## 2014-09-30 DIAGNOSIS — I482 Chronic atrial fibrillation: Secondary | ICD-10-CM | POA: Diagnosis not present

## 2014-09-30 DIAGNOSIS — Z23 Encounter for immunization: Secondary | ICD-10-CM | POA: Diagnosis not present

## 2014-09-30 DIAGNOSIS — R0600 Dyspnea, unspecified: Secondary | ICD-10-CM | POA: Diagnosis not present

## 2014-09-30 DIAGNOSIS — I200 Unstable angina: Secondary | ICD-10-CM

## 2014-09-30 DIAGNOSIS — I5033 Acute on chronic diastolic (congestive) heart failure: Secondary | ICD-10-CM

## 2014-09-30 HISTORY — DX: Acute on chronic diastolic (congestive) heart failure: I50.33

## 2014-09-30 HISTORY — DX: Type 2 diabetes mellitus with diabetic autonomic (poly)neuropathy: E11.43

## 2014-09-30 MED ORDER — LISINOPRIL 20 MG PO TABS: 20.0000 mg | ORAL_TABLET | Freq: Every day | ORAL | Status: DC

## 2014-09-30 NOTE — Progress Notes (Signed)
Patient ID: Seth [name], male   DOB: 1941-03-04, 73 y.o.   MRN: [id number]    Chief Complaint  Patient presents with  . Annual Exam    Yearly check-up    No Known Allergies  HPI 73 y/o male patient is here for his annual exam. He denies any concerns this visit. He has hx of HTN, CAD, tpe 2 DM, Afib on coumadin. Reviewed home bp reading mostly SBP 144-170/ DBP 68-78 Reviewed home cbg 118-156 Was seen by his cardiologist recently and due to weight gain and elevated BNP his lasix dosing was increased to 80 mg in am and 40 mg in pm. His weight has now come down to 238 lb.  He is complaint with his medications  Review of Systems  Constitutional: Negative for fever, chills, malaise/fatigue and diaphoresis.  Wt Readings from Last 3 Encounters:  09/30/14 240 lb 6.4 oz (109.045 kg)  09/23/14 245 lb (111.131 kg)  09/11/14 238 lb (107.956 kg)   HENT: Negative for congestion, hearing loss and sore throat.   Eyes: Negative for blurred vision, double vision and discharge. has not had an eye exam this year Respiratory: Negative for cough, sputum production, shortness of breath and wheezing.  gets some shortness of breath after finishing 3 laps of walking. He normally walks 4 Laps a day Cardiovascular: Negative for chest pain, palpitations, orthopnea, PND and leg swelling.  Gastrointestinal: Negative for heartburn, nausea, vomiting, abdominal pain, melena, rectal bleed, diarrhea and constipation.  Genitourinary: Negative for dysuria, urgency,flank pain. on diuretics and gets up a couple of night to urinate. Sees a urologist, hx of prostate cancer s/p radiation and medication treatment and now being monitored every 6 months Musculoskeletal: Negative for back pain, falls, joint pain and myalgias.  Skin: Negative for itching and rash.  Neurological: Negative for dizziness, tingling, focal weakness and headaches.  Psychiatric/Behavioral: Negative for depression and memory loss. The patient is not  nervous/anxious.    Past Medical History  Diagnosis Date  . Impotence of organic origin   . CKD (chronic kidney disease)     proteinuria  . Type II or unspecified type diabetes mellitus with renal manifestations, uncontrolled   . Peripheral vascular disease, unspecified   . HTN (hypertension)   . Hemorrhage of rectum and anus   . Prostate cancer   . Thrombocytopenia, unspecified   . Tobacco use disorder   . Nephrolithiasis   . Hemorrhage of gastrointestinal tract, unspecified   . HLD (hyperlipidemia)   . CAD (coronary artery disease)     a.  Lexiscan Myoview (02/14/14):  Apical, inferolateral scar; ? Mild ischemia; EF 32%.;  b.  LHC (02/21/14):  dLM 100%, LAD 100%, CFX 100%, dRCA 50%. => CABG (L-LAD, S-OM, S-PDA)  . Ischemic cardiomyopathy     a. Echo (02/17/14):  EF 35-40%, anteroseptal and apical AK, Gr 2 DD, MAC, mod LAE.  [name] Chronic systolic heart failure   . Atrial fibrillation     a. coumadin;  b. Amiodarone   Past Surgical History  Procedure Laterality Date  . Back surgery    . Cataract extraction  2010  . Coronary artery bypass graft N/A 02/24/2014    Procedure: Coronary artery bypass graft times three using left internal mammary artery and left leg greater saphenous vein harvested endoscopically.;  Surgeon: Gaye Pollack, MD;  Location: [facility] OR;  Service: Open Heart Surgery;  Laterality: N/A;  . Intraoperative transesophageal echocardiogram N/A 02/24/2014    Procedure: INTRAOPERATIVE TRANSESOPHAGEAL ECHOCARDIOGRAM;  Surgeon: Gaspar Bidding  Alveria Apley, MD;  Location: [place];  Service: Open Heart Surgery;  Laterality: N/A;  . Clipping of atrial appendage Left 02/24/2014    Procedure: CLIPPING OF ATRIAL APPENDAGE;  Surgeon: Gaye Pollack, MD;  Location: [place];  Service: Open Heart Surgery;  Laterality: Left;  . Colonoscopy  2005    Normal   Family History  Problem Relation Age of Onset  . Cancer Mother 21    leukemia  . Heart disease Father   . Heart attack Father 89   History   Social  History  . Marital Status: Married    Spouse Name: N/A    Number of Children: 3  . Years of Education: N/A   Occupational History  . Sell storage buildings    Social History Main Topics  . Smoking status: Never Smoker   . Smokeless tobacco: Former Systems developer    Types: Chew    Quit date: 02/23/2014  . Alcohol Use: No  . Drug Use: No  . Sexual Activity: Not on file   Other Topics Concern  . Not on file   Social History Narrative  . No narrative on file   Physical exam BP 130/80  Pulse 96  Temp(Src) 98.5 F (36.9 C) (Oral)  Resp 18  Ht 5\' 10"  (1.778 m)  Wt 240 lb 6.4 oz (109.045 kg)  BMI 34.49 kg/m2  SpO2 95%  General- elderly obese male in no acute distress Head- atraumatic, normocephalic Eyes- PERRLA, EOMI, no pallor, no icterus, no discharge Ears- left ear normal tympanic membrane and normal external ear canal , right ear normal tympanic membrane and normal external ear canal Neck- no lymphadenopathy, no thyromegaly, no jugular vein distension, no carotid bruit Nose- normal nasaal mucosa, no maxillary sinus tenderness, no frontal sinus tenderness Mouth- normal mucus membrane, no oral thrush, normal oropharynx Chest- no chest wall deformities, no chest wall tenderness Breast- no masses, no palpable lumps, normal nipple and areola exam, no axillary lymphadenopathy Cardiovascular- normal s1,s2, no murmurs/ rubs/ gallops, normal distal pulses Respiratory- bilateral clear to auscultation, no wheeze, no rhonchi, no crackles Abdomen- bowel sounds present, soft, non tender, no abdominal bruits, no guarding or rigidity, no CVA tenderness Musculoskeletal- able to move all 4 extremities, no spinal and paraspinal tenderness, steady gait, no use of assistive device, normal range of motion, trace leg edema Neurological- no focal deficit, normal reflexes, normal muscle strength, normal sensation to vibration but decreased sensation to fine touch Skin- warm and dry, seborrheic keratosis in  the back Nails- thickened nails Psychiatry- alert and oriented to person, place and time, normal mood and affect  Labs  CBC Latest Ref Rng 08/29/2014 02/27/2014 02/26/2014  WBC 3.4 - 10.8 x10E3/uL 7.6 14.1(H) 16.3(H)  Hemoglobin 12.6 - 17.7 g/dL 12.1(L) 9.7(L) 10.2(L)  Hematocrit 37.5 - 51.0 % 37.2(L) 30.2(L) 31.3(L)  Platelets 150 - 400 K/uL - 102(L) 103(L)    CMP     Component Value Date/Time   NA 138 09/23/2014 1208   NA 142 08/29/2014 0845   K 3.8 09/23/2014 1208   CL 102 09/23/2014 1208   CO2 32 09/23/2014 1208   GLUCOSE 159* 09/23/2014 1208   GLUCOSE 149* 08/29/2014 0845   BUN 15 09/23/2014 1208   BUN 18 08/29/2014 0845   CREATININE 1.0 09/23/2014 1208   CALCIUM 9.3 09/23/2014 1208   PROT 6.7 08/29/2014 0845   PROT 7.1 02/23/2014 1646   ALBUMIN 3.5 02/23/2014 1646   AST 15 08/29/2014 0845   ALT 19 08/29/2014 0845  ALKPHOS 63 08/29/2014 0845   BILITOT 0.6 08/29/2014 0845   GFRNONAA 73 08/29/2014 0845   GFRAA 84 08/29/2014 0845   Lipid Panel     Component Value Date/Time   TRIG 80 05/23/2014 0841   HDL 45 05/23/2014 0841   CHOLHDL 2.2 05/23/2014 0841   LDLCALC 40 05/23/2014 0841   Lab Results  Component Value Date   HGBA1C 7.1* 08/29/2014   09/23/14 proBNP 642  Assessment/plan  1. Hypertension, benign bp elevated, increase lisinopril to 20 mg daily and continue current regimen for other meds  2. Atherosclerosis of native coronary artery of native heart without angina pectoris Remains chest pain free. Continue coreg, lisinopril and asa - Brain Natriuretic Peptide - Basic Metabolic Panel  3. 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 Continue asa, acei and statin. Check a1c next visit. Has microalbuminuria  4. Hyperlipidemia Continue atorvastatin daily, reviewed flp  5. Cardiomyopathy, ischemic Stable, continue med management - Brain Natriuretic Peptide  6. Acute on chronic  combined systolic and diastolic congestive heart failure Improved with weight coming down. Continue lasix 80 in am and 40 in pm. Continue coreg and will increase dose of lisinopril to 20 mg daily. Monitor clinically. Continue to monitor weight at home. Patient is advised to contact our office for shortness of breath, chest pain or significant weight gain of greater than 3 pounds in one day or greater than 5 pounds in 1week - Brain Natriuretic Peptide  7. Encounter for Medicare annual wellness exam the patient was counseled regarding prevention of dental and periodontal disease, diet, regular sustained exercise for at least 30 minutes 5 times per week, the proper use of sunscreen and protective clothing, tobacco use, and recommended schedule for GI hemoccult testing, colonoscopy, cholesterol, thyroid and diabetes screening. He has advance directive, need copy for chart. Labs reviewed

## 2014-10-01 LAB — BASIC METABOLIC PANEL
BUN/Creatinine Ratio: 15 (ref 10–22)
BUN: 18 mg/dL (ref 8–27)
CO2: 25 mmol/L (ref 18–29)
Calcium: 9.4 mg/dL (ref 8.6–10.2)
Chloride: 98 mmol/L (ref 97–108)
Creatinine, Ser: 1.17 mg/dL (ref 0.76–1.27)
GFR calc Af Amer: 72 mL/min/{1.73_m2} (ref >59)
GFR calc non Af Amer: 62 mL/min/{1.73_m2} (ref >59)
Glucose: 288 mg/dL — ABNORMAL HIGH (ref 65–99)
Potassium: 3.7 mmol/L (ref 3.5–5.2)
Sodium: 141 mmol/L (ref 134–144)

## 2014-10-01 LAB — BRAIN NATRIURETIC PEPTIDE: BNP: 393.5 pg/mL — ABNORMAL HIGH (ref 0.0–100.0)

## 2014-10-02 ENCOUNTER — Ambulatory Visit (INDEPENDENT_AMBULATORY_CARE_PROVIDER_SITE_OTHER): Payer: Medicare Other | Admitting: *Deleted

## 2014-10-02 DIAGNOSIS — Z5181 Encounter for therapeutic drug level monitoring: Secondary | ICD-10-CM

## 2014-10-02 DIAGNOSIS — I4891 Unspecified atrial fibrillation: Secondary | ICD-10-CM

## 2014-10-02 LAB — POCT INR: INR: 2.4

## 2014-10-03 ENCOUNTER — Other Ambulatory Visit: Payer: Medicare Other

## 2014-10-07 ENCOUNTER — Other Ambulatory Visit: Payer: Self-pay | Admitting: *Deleted

## 2014-10-07 MED ORDER — ATORVASTATIN CALCIUM 40 MG PO TABS: 40.0000 mg | ORAL_TABLET | Freq: Every day | ORAL | Status: DC

## 2014-10-07 NOTE — Telephone Encounter (Signed)
Patient Requested to be faxed to [facility]

## 2014-10-30 ENCOUNTER — Ambulatory Visit (INDEPENDENT_AMBULATORY_CARE_PROVIDER_SITE_OTHER): Payer: Medicare Other | Admitting: Pharmacist Clinician (PhC)/ Clinical Pharmacy Specialist

## 2014-10-30 DIAGNOSIS — Z5181 Encounter for therapeutic drug level monitoring: Secondary | ICD-10-CM | POA: Diagnosis not present

## 2014-10-30 DIAGNOSIS — I4891 Unspecified atrial fibrillation: Secondary | ICD-10-CM

## 2014-10-30 LAB — POCT INR: INR: 2

## 2014-11-05 ENCOUNTER — Encounter: Payer: Self-pay | Admitting: *Deleted

## 2014-12-04 ENCOUNTER — Encounter (HOSPITAL_COMMUNITY): Payer: Self-pay | Admitting: Cardiology

## 2014-12-11 ENCOUNTER — Ambulatory Visit (INDEPENDENT_AMBULATORY_CARE_PROVIDER_SITE_OTHER): Payer: Medicare Other

## 2014-12-11 DIAGNOSIS — I4891 Unspecified atrial fibrillation: Secondary | ICD-10-CM | POA: Diagnosis not present

## 2014-12-11 DIAGNOSIS — Z5181 Encounter for therapeutic drug level monitoring: Secondary | ICD-10-CM | POA: Diagnosis not present

## 2014-12-11 LAB — POCT INR: INR: 1.8

## 2014-12-17 ENCOUNTER — Other Ambulatory Visit: Payer: Medicare Other

## 2014-12-23 ENCOUNTER — Ambulatory Visit: Payer: Medicare Other | Admitting: Internal Medicine

## 2015-01-02 ENCOUNTER — Other Ambulatory Visit: Payer: Medicare Other

## 2015-01-05 ENCOUNTER — Other Ambulatory Visit: Payer: Self-pay | Admitting: Internal Medicine

## 2015-01-05 ENCOUNTER — Other Ambulatory Visit: Payer: Self-pay | Admitting: Cardiovascular Disease

## 2015-01-07 ENCOUNTER — Ambulatory Visit: Payer: Medicare Other | Admitting: Internal Medicine

## 2015-01-08 ENCOUNTER — Ambulatory Visit (INDEPENDENT_AMBULATORY_CARE_PROVIDER_SITE_OTHER): Payer: Medicare Other

## 2015-01-08 DIAGNOSIS — I4891 Unspecified atrial fibrillation: Secondary | ICD-10-CM

## 2015-01-08 DIAGNOSIS — Z5181 Encounter for therapeutic drug level monitoring: Secondary | ICD-10-CM

## 2015-01-08 LAB — POCT INR: INR: 1.8

## 2015-01-16 ENCOUNTER — Other Ambulatory Visit: Payer: Medicare Other

## 2015-01-19 ENCOUNTER — Other Ambulatory Visit: Payer: Medicare Other

## 2015-01-19 ENCOUNTER — Other Ambulatory Visit: Payer: Self-pay | Admitting: *Deleted

## 2015-01-19 DIAGNOSIS — I10 Essential (primary) hypertension: Secondary | ICD-10-CM

## 2015-01-19 DIAGNOSIS — E1129 Type 2 diabetes mellitus with other diabetic kidney complication: Secondary | ICD-10-CM | POA: Diagnosis not present

## 2015-01-19 DIAGNOSIS — I129 Hypertensive chronic kidney disease with stage 1 through stage 4 chronic kidney disease, or unspecified chronic kidney disease: Secondary | ICD-10-CM

## 2015-01-19 DIAGNOSIS — IMO0002 Reserved for concepts with insufficient information to code with codable children: Secondary | ICD-10-CM

## 2015-01-19 DIAGNOSIS — N184 Chronic kidney disease, stage 4 (severe): Secondary | ICD-10-CM | POA: Diagnosis not present

## 2015-01-19 DIAGNOSIS — N182 Chronic kidney disease, stage 2 (mild): Secondary | ICD-10-CM | POA: Diagnosis not present

## 2015-01-19 DIAGNOSIS — N183 Chronic kidney disease, stage 3 (moderate): Secondary | ICD-10-CM | POA: Diagnosis not present

## 2015-01-19 DIAGNOSIS — N185 Chronic kidney disease, stage 5: Secondary | ICD-10-CM | POA: Diagnosis not present

## 2015-01-19 DIAGNOSIS — E1165 Type 2 diabetes mellitus with hyperglycemia: Secondary | ICD-10-CM | POA: Diagnosis not present

## 2015-01-19 DIAGNOSIS — N181 Chronic kidney disease, stage 1: Secondary | ICD-10-CM | POA: Diagnosis not present

## 2015-01-19 DIAGNOSIS — N189 Chronic kidney disease, unspecified: Secondary | ICD-10-CM | POA: Diagnosis not present

## 2015-01-20 ENCOUNTER — Ambulatory Visit (INDEPENDENT_AMBULATORY_CARE_PROVIDER_SITE_OTHER): Payer: Medicare Other | Admitting: Internal Medicine

## 2015-01-20 ENCOUNTER — Encounter: Payer: Self-pay | Admitting: Internal Medicine

## 2015-01-20 VITALS — BP 138/82 | HR 85 | Temp 97.9°F | Resp 10 | Ht 70.0 in | Wt 224.0 lb

## 2015-01-20 DIAGNOSIS — E1151 Type 2 diabetes mellitus with diabetic peripheral angiopathy without gangrene: Secondary | ICD-10-CM

## 2015-01-20 DIAGNOSIS — I10 Essential (primary) hypertension: Secondary | ICD-10-CM

## 2015-01-20 DIAGNOSIS — I4891 Unspecified atrial fibrillation: Secondary | ICD-10-CM

## 2015-01-20 DIAGNOSIS — I255 Ischemic cardiomyopathy: Secondary | ICD-10-CM

## 2015-01-20 DIAGNOSIS — E1159 Type 2 diabetes mellitus with other circulatory complications: Secondary | ICD-10-CM

## 2015-01-20 DIAGNOSIS — E785 Hyperlipidemia, unspecified: Secondary | ICD-10-CM

## 2015-01-20 LAB — CBC WITH DIFFERENTIAL/PLATELET
Basophils Absolute: 0 10*3/uL (ref 0.0–0.2)
Basos: 0 %
Eos: 5 %
Eosinophils Absolute: 0.3 10*3/uL (ref 0.0–0.4)
HCT: 40.8 % (ref 37.5–51.0)
Hemoglobin: 13.4 g/dL (ref 12.6–17.7)
Immature Grans (Abs): 0 10*3/uL (ref 0.0–0.1)
Immature Granulocytes: 0 %
Lymphocytes Absolute: 1.5 10*3/uL (ref 0.7–3.1)
Lymphs: 20 %
MCH: 26.4 pg — ABNORMAL LOW (ref 26.6–33.0)
MCHC: 32.8 g/dL (ref 31.5–35.7)
MCV: 81 fL (ref 79–97)
Monocytes Absolute: 0.9 10*3/uL (ref 0.1–0.9)
Monocytes: 13 %
Neutrophils Absolute: 4.4 10*3/uL (ref 1.4–7.0)
Neutrophils Relative %: 62 %
Platelets: 145 10*3/uL — ABNORMAL LOW (ref 150–379)
RBC: 5.07 x10E6/uL (ref 4.14–5.80)
RDW: 16 % — ABNORMAL HIGH (ref 12.3–15.4)
WBC: 7.2 10*3/uL (ref 3.4–10.8)

## 2015-01-20 LAB — COMPREHENSIVE METABOLIC PANEL
ALT: 19 IU/L (ref 0–44)
AST: 25 IU/L (ref 0–40)
Albumin/Globulin Ratio: 1.3 (ref 1.1–2.5)
Albumin: 4 g/dL (ref 3.5–4.8)
Alkaline Phosphatase: 68 IU/L (ref 39–117)
BUN/Creatinine Ratio: 18 (ref 10–22)
BUN: 17 mg/dL (ref 8–27)
CO2: 21 mmol/L (ref 18–29)
Calcium: 9.2 mg/dL (ref 8.6–10.2)
Chloride: 98 mmol/L (ref 97–108)
Creatinine, Ser: 0.94 mg/dL (ref 0.76–1.27)
GFR calc Af Amer: 93 mL/min/{1.73_m2} (ref >59)
GFR calc non Af Amer: 81 mL/min/{1.73_m2} (ref >59)
Globulin, Total: 3.2 g/dL (ref 1.5–4.5)
Glucose: 216 mg/dL — ABNORMAL HIGH (ref 65–99)
Potassium: 4.4 mmol/L (ref 3.5–5.2)
Sodium: 142 mmol/L (ref 134–144)
Total Bilirubin: 0.6 mg/dL (ref 0.0–1.2)
Total Protein: 7.2 g/dL (ref 6.0–8.5)

## 2015-01-20 LAB — LIPID PANEL
Chol/HDL Ratio: 2.9 ratio units (ref 0.0–5.0)
Cholesterol, Total: 125 mg/dL (ref 100–199)
HDL: 43 mg/dL (ref >39)
LDL Calculated: 52 mg/dL (ref 0–99)
Triglycerides: 149 mg/dL (ref 0–149)
VLDL Cholesterol Cal: 30 mg/dL (ref 5–40)

## 2015-01-20 LAB — HEMOGLOBIN A1C
Est. average glucose Bld gHb Est-mCnc: 180 mg/dL
Hgb A1c MFr Bld: 7.9 % — ABNORMAL HIGH (ref 4.8–5.6)

## 2015-01-20 MED ORDER — FUROSEMIDE 40 MG PO TABS: 40.0000 mg | ORAL_TABLET | Freq: Two times a day (BID) | ORAL | Status: DC

## 2015-01-20 NOTE — Progress Notes (Signed)
Patient ID: Seth [name], male   DOB: May 21, 1941, 74 y.o.   MRN: [id number]    Chief Complaint  Patient presents with  . Medical Management of Chronic Issues    3 month follow-up, discuss labs (Copy printed)   . Immunizations    Patient thinks he had prevnar 62 at Cardiologist    No Known Allergies  HPI 74 y/o male patient is here for routine visit.He denies any concerns this visit. He had a good holiday time. BP readings at home has been SBP 130-150/ 70-80. Taking coreg, lasix and lisinopril. bp at office within normal range. He has been under stress taking care of him mother and brother- brother  Has been diagnosed with cancer and lives in [place] and mother is now in a nursing home undergoing rehabilitation. He has stopped exercising like before. cbg readings at home 140-150. No cbg reading greater than 180 or less than 70. Taking glipizide and metformin  He has hx of HTN, CAD, tpe 2 DM, Afib on coumadin. Followed by anticoagulation clinic for coumadin. He is complaint with his medications  ROS Constitutional: negative for fever and chills HENT: Negative for congestion, hearing loss and sore throat.   Eyes: Negative for blurred vision, double vision and discharge. Had an eye exam a month back Respiratory: Negative for cough, sputum production, shortness of breath and wheezing.  Cardiovascular: Negative for chest pain, palpitations, orthopnea, PND and leg swelling.  Gastrointestinal: Negative for heartburn, nausea, vomiting, abdominal pain, melena, rectal bleed, diarrhea and constipation.  Genitourinary: Negative for dysuria, urgency,flank pain. on diuretics and gets up a couple of night to urinate. Sees a urologist, hx of prostate cancer s/p radiation and medication treatment and now being monitored every 6 months Musculoskeletal: Negative for back pain, falls, joint pain and myalgias.  Skin: Negative for itching and rash.  Neurological: Negative for dizziness, tingling, focal  weakness and headaches.  Psychiatric/Behavioral: Negative for depression and memory loss. The patient is not nervous/anxious.      Past Medical History  Diagnosis Date  . Impotence of organic origin   . CKD (chronic kidney disease)     proteinuria  . Type II or unspecified type diabetes mellitus with renal manifestations, uncontrolled   . Peripheral vascular disease, unspecified   . HTN (hypertension)   . Hemorrhage of rectum and anus   . Prostate cancer   . Thrombocytopenia, unspecified   . Tobacco use disorder   . Nephrolithiasis   . Hemorrhage of gastrointestinal tract, unspecified   . HLD (hyperlipidemia)   . CAD (coronary artery disease)     a.  Lexiscan Myoview (02/14/14):  Apical, inferolateral scar; ? Mild ischemia; EF 32%.;  b.  LHC (02/21/14):  dLM 100%, LAD 100%, CFX 100%, dRCA 50%. => CABG (L-LAD, S-OM, S-PDA)  . Ischemic cardiomyopathy     a. Echo (02/17/14):  EF 35-40%, anteroseptal and apical AK, Gr 2 DD, MAC, mod LAE.  [name] Chronic systolic heart failure   . Atrial fibrillation     a. coumadin;  b. Amiodarone   Past Surgical History  Procedure Laterality Date  . Back surgery    . Cataract extraction  2010  . Coronary artery bypass graft N/A 02/24/2014    Procedure: Coronary artery bypass graft times three using left internal mammary artery and left leg greater saphenous vein harvested endoscopically.;  Surgeon: Gaye Pollack, MD;  Location: [facility] OR;  Service: Open Heart Surgery;  Laterality: N/A;  . Intraoperative transesophageal echocardiogram N/A 02/24/2014  Procedure: INTRAOPERATIVE TRANSESOPHAGEAL ECHOCARDIOGRAM;  Surgeon: Gaye Pollack, MD;  Location: [facility] OR;  Service: Open Heart Surgery;  Laterality: N/A;  . Clipping of atrial appendage Left 02/24/2014    Procedure: CLIPPING OF ATRIAL APPENDAGE;  Surgeon: Gaye Pollack, MD;  Location: [place];  Service: Open Heart Surgery;  Laterality: Left;  . Colonoscopy  2005    Normal  . Left heart catheterization with coronary  angiogram N/A 02/21/2014    Procedure: LEFT HEART CATHETERIZATION WITH CORONARY ANGIOGRAM;  Surgeon: Peter M Martinique, MD;  Location: [facility] CATH LAB;  Service: Cardiovascular;  Laterality: N/A;   Current Outpatient Prescriptions on File Prior to Visit  Medication Sig Dispense Refill  . aspirin EC 81 MG tablet Take 1 tablet (81 mg total) by mouth daily.    [name] atorvastatin (LIPITOR) 40 MG tablet Take 1 tablet (40 mg total) by mouth daily. 90 tablet 3  . Calcium Carbonate-Vitamin D 600-200 MG-UNIT TABS Take 1 tablet by mouth daily. For bones     . carvedilol (COREG) 6.25 MG tablet Take 1 tablet (6.25 mg total) by mouth 2 (two) times daily with a meal. 180 tablet 3  . Flaxseed, Linseed, (FLAXSEED OIL PO) Take 1 tablet by mouth daily.     . furosemide (LASIX) 40 MG tablet Take 2 tablets every morning and one tablet every afternoon 270 tablet 3  . glipiZIDE (GLUCOTROL) 5 MG tablet Take 2.5 mg by mouth daily before breakfast. 1/2 tablet    . lisinopril (PRINIVIL,ZESTRIL) 20 MG tablet Take 1 tablet (20 mg total) by mouth daily. 90 tablet 3  . metFORMIN (GLUCOPHAGE) 1000 MG tablet TAKE 1 TABLET BY MOUTH TWICE A DAY FOR DIABETES 60 tablet 5  . Multiple Vitamin (MULTIVITAMINS PO) Take 1 tablet by mouth daily.      . potassium chloride SA (K-DUR,KLOR-CON) 10 MEQ tablet Take 1 tablet (10 mEq total) by mouth daily. 90 tablet 1  . warfarin (COUMADIN) 5 MG tablet TAKE 1 TABLET BY MOUTH DAILY OR AS DIRECTED BY COUMADIN CLINIC 60 tablet 3   No current facility-administered medications on file prior to visit.    Physical exam BP 138/82 mmHg  Pulse 85  Temp(Src) 97.9 F (36.6 C) (Oral)  Resp 10  Ht 5\' 10"  (1.778 m)  Wt 224 lb (101.606 kg)  BMI 32.14 kg/m2  SpO2 97%   Wt Readings from Last 3 Encounters:  01/20/15 224 lb (101.606 kg)  09/30/14 240 lb 6.4 oz (109.045 kg)  09/23/14 245 lb (111.131 kg)   General- elderly obese male in no acute distress Head- atraumatic, normocephalic Eyes- PERRLA, EOMI, no  pallor, no icterus, no discharge Neck- no lymphadenopathy Mouth- normal mucus membrane, no oral thrush, normal oropharynx Cardiovascular- normal s1,s2, no murmurs/ rubs/ gallops, normal distal pulses Respiratory- bilateral clear to auscultation, no wheeze, no rhonchi, no crackles Abdomen- bowel sounds present, soft, non tender Musculoskeletal- able to move all 4 extremities, no spinal and paraspinal tenderness, steady gait, no use of assistive device, normal range of motion, trace leg edema Neurological- no focal deficit, normal reflexes, normal muscle strength, normal sensation to vibration but decreased sensation to fine touch Skin- warm and dry, seborrheic keratosis in the back Psychiatry- alert and oriented to person, place and time, normal mood and affect  Labs CBC Latest Ref Rng 01/19/2015 08/29/2014 02/27/2014  WBC 3.4 - 10.8 x10E3/uL 7.2 7.6 14.1(H)  Hemoglobin 12.6 - 17.7 g/dL 13.4 12.1(L) 9.7(L)  Hematocrit 37.5 - 51.0 % 40.8 37.2(L) 30.2(L)  Platelets 150 -  379 x10E3/uL 145(L) - 102(L)    CMP Latest Ref Rng 01/19/2015 09/30/2014 09/23/2014  Glucose 65 - 99 mg/dL 216(H) 288(H) 159(H)  BUN 8 - 27 mg/dL 17 18 15   Creatinine 0.76 - 1.27 mg/dL 0.94 1.17 1.0  Sodium 134 - 144 mmol/L 142 141 138  Potassium 3.5 - 5.2 mmol/L 4.4 3.7 3.8  Chloride 97 - 108 mmol/L 98 98 102  CO2 18 - 29 mmol/L 21 25 32  Calcium 8.6 - 10.2 mg/dL 9.2 9.4 9.3  Total Protein 6.0 - 8.5 g/dL 7.2 - -  Albumin 3.5 - 4.8 g/dL 4.0 - -  Total Bilirubin 0.0 - 1.2 mg/dL 0.6 - -  Alkaline Phos 39 - 117 IU/L 68 - -  AST 0 - 40 IU/L 25 - -  ALT 0 - 44 IU/L 19 - -   Lipid Panel     Component Value Date/Time   TRIG 149 01/19/2015 1015   HDL 43 01/19/2015 1015   CHOLHDL 2.9 01/19/2015 1015   LDLCALC 52 01/19/2015 1015   Lab Results  Component Value Date   HGBA1C 7.9* 01/19/2015    Assessment/plan  1. Type 2 diabetes mellitus with vascular disease a1c s/o poorly controlled DM.  Increase the glipizide to 5 mg  daily Continue metformin 500 mg bid uptodate with urine microalbumin, foot and eye exam Monitor cbg Continue aspirin, lipitor and lisinopril - Hemoglobin A1c; Future - CMP; Future - Microalbumin/Creatinine Ratio, Urine; Future  2. Hypertension, benign Stable in office,elevated bp reading at home, pt refuses med adjustment for now. Mentions he will resume his exercise and get back to his healthy diet now. Continue coreg 6.25 mg bid, lisinopril 20 mg daily  - CMP; Future  3. Atrial fibrillation, unspecified Rate controlled, continue coreg and coumadin, inr f/u in coumadin clinic  4. Hyperlipidemia On lipitor 40 mg daily, ldl at goal, continue current regimen with recent cardiac history  5. chf Euvolemic, weight stable, reviewed renal function. Continue b blocker, ACEI and decrease lasix to 40 mg bid for now. Monitor weight       Change lasix to 40 mg bid

## 2015-01-22 ENCOUNTER — Encounter: Payer: Self-pay | Admitting: Cardiovascular Disease

## 2015-01-22 ENCOUNTER — Ambulatory Visit (INDEPENDENT_AMBULATORY_CARE_PROVIDER_SITE_OTHER): Payer: Medicare Other | Admitting: Cardiovascular Disease

## 2015-01-22 VITALS — BP 138/80 | HR 94 | Ht 70.0 in | Wt 259.4 lb

## 2015-01-22 DIAGNOSIS — E785 Hyperlipidemia, unspecified: Secondary | ICD-10-CM

## 2015-01-22 DIAGNOSIS — I480 Paroxysmal atrial fibrillation: Secondary | ICD-10-CM

## 2015-01-22 DIAGNOSIS — I10 Essential (primary) hypertension: Secondary | ICD-10-CM | POA: Diagnosis not present

## 2015-01-22 DIAGNOSIS — I5022 Chronic systolic (congestive) heart failure: Secondary | ICD-10-CM

## 2015-01-22 DIAGNOSIS — I2510 Atherosclerotic heart disease of native coronary artery without angina pectoris: Secondary | ICD-10-CM | POA: Diagnosis not present

## 2015-01-22 DIAGNOSIS — I255 Ischemic cardiomyopathy: Secondary | ICD-10-CM | POA: Diagnosis not present

## 2015-01-22 NOTE — Progress Notes (Signed)
History of Present Illness: 74 yo male with a history of DM, HTN, HLD, CAD, prostate CA, renal insufficiency, obesity here today for cardiac follow up. He was seen as a new patient 02/12/14 for evaluation of atrial fib. He was placed on Xarelto. He was set up for echo and stress test. Stress test was abnormal with apical and inferolateral scar and ischemia, LVEF of 32%. Echo 02/17/14 with LVEF=35-40% with akinesis of the mid-distalanteroseptal and apical myocardium, MAC, dilated LA. Cardiac cath 02/21/14 with occluded left main, moderate RCA stenosis with the entire left system filling from right to left collaterals. 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 He was discharged on amiodarone and coumadin. Echo 05/28/14 with LVEF=35-40%, concentric LVH, mild MR. Seen by Dr. Mare Ferrari as DOD add on for w/u of worsened SOB, noted while in coumadin clinic. Primary care workup included chest x-ray which showed small bilateral pleural effusions left greater than right. His BNP was elevated at 680. He was advised to increase his furosemide and diuresed well with appropriate weight loss. His breathing and orthopnea improved.   He is here today for follow up. No chest pain or SOB. Weighing daily and stable weights at home.   Primary Care Physician: Bubba Camp  Last Lipid Profile:Lipid Panel     Component Value Date/Time   TRIG 149 01/19/2015 1015   HDL 43 01/19/2015 1015   CHOLHDL 2.9 01/19/2015 1015   LDLCALC 52 01/19/2015 1015     Past Medical History  Diagnosis Date  . Impotence of organic origin   . CKD (chronic kidney disease)     proteinuria  . Type II or unspecified type diabetes mellitus with renal manifestations, uncontrolled   . Peripheral vascular disease, unspecified   . HTN (hypertension)   . Hemorrhage of rectum and anus   . Prostate cancer   . Thrombocytopenia, unspecified   . Tobacco use disorder   . Nephrolithiasis   . Hemorrhage of gastrointestinal  tract, unspecified   . HLD (hyperlipidemia)   . CAD (coronary artery disease)     a.  Lexiscan Myoview (02/14/14):  Apical, inferolateral scar; ? Mild ischemia; EF 32%.;  b.  LHC (02/21/14):  dLM 100%, LAD 100%, CFX 100%, dRCA 50%. => CABG (L-LAD, S-OM, S-PDA)  . Ischemic cardiomyopathy     a. Echo (02/17/14):  EF 35-40%, anteroseptal and apical AK, Gr 2 DD, MAC, mod LAE.  [name] Chronic systolic heart failure   . Atrial fibrillation     a. coumadin;  b. Amiodarone    Past Surgical History  Procedure Laterality Date  . Back surgery    . Cataract extraction  2010  . Coronary artery bypass graft N/A 02/24/2014    Procedure: Coronary artery bypass graft times three using left internal mammary artery and left leg greater saphenous vein harvested endoscopically.;  Surgeon: Gaye Pollack, MD;  Location: [facility] OR;  Service: Open Heart Surgery;  Laterality: N/A;  . Intraoperative transesophageal echocardiogram N/A 02/24/2014    Procedure: INTRAOPERATIVE TRANSESOPHAGEAL ECHOCARDIOGRAM;  Surgeon: Gaye Pollack, MD;  Location: [facility] OR;  Service: Open Heart Surgery;  Laterality: N/A;  . Clipping of atrial appendage Left 02/24/2014    Procedure: CLIPPING OF ATRIAL APPENDAGE;  Surgeon: Gaye Pollack, MD;  Location: [place];  Service: Open Heart Surgery;  Laterality: Left;  . Colonoscopy  2005    Normal  . Left heart catheterization with coronary angiogram N/A 02/21/2014  Procedure: LEFT HEART CATHETERIZATION WITH CORONARY ANGIOGRAM;  Surgeon: Peter M Martinique, MD;  Location: [facility] CATH LAB;  Service: Cardiovascular;  Laterality: N/A;    Current Outpatient Prescriptions  Medication Sig Dispense Refill  . aspirin EC 81 MG tablet Take 1 tablet (81 mg total) by mouth daily.    [name] atorvastatin (LIPITOR) 40 MG tablet Take 1 tablet (40 mg total) by mouth daily. 90 tablet 3  . Calcium Carbonate-Vitamin D 600-200 MG-UNIT TABS Take 1 tablet by mouth daily. For bones     . carvedilol (COREG) 6.25 MG tablet Take 1 tablet (6.25 mg  total) by mouth 2 (two) times daily with a meal. 180 tablet 3  . Flaxseed, Linseed, (FLAXSEED OIL PO) Take 1 tablet by mouth daily.     . furosemide (LASIX) 40 MG tablet Take 1 tablet (40 mg total) by mouth 2 (two) times daily. 180 tablet 3  . glipiZIDE (GLUCOTROL) 5 MG tablet Take 5 mg by mouth daily before breakfast.    . lisinopril (PRINIVIL,ZESTRIL) 20 MG tablet Take 1 tablet (20 mg total) by mouth daily. 90 tablet 3  . metFORMIN (GLUCOPHAGE) 1000 MG tablet TAKE 1 TABLET BY MOUTH TWICE A DAY FOR DIABETES 60 tablet 5  . Multiple Vitamin (MULTIVITAMINS PO) Take 1 tablet by mouth daily.      . potassium chloride SA (K-DUR,KLOR-CON) 10 MEQ tablet Take 1 tablet (10 mEq total) by mouth daily. 90 tablet 1  . warfarin (COUMADIN) 5 MG tablet TAKE 1 TABLET BY MOUTH DAILY OR AS DIRECTED BY COUMADIN CLINIC 60 tablet 3   No current facility-administered medications for this visit.    No Known Allergies  History   Social History  . Marital Status: Married    Spouse Name: N/A    Number of Children: 3  . Years of Education: N/A   Occupational History  . Sell storage buildings    Social History Main Topics  . Smoking status: Never Smoker   . Smokeless tobacco: Former Systems developer    Types: Chew    Quit date: 02/23/2014  . Alcohol Use: No  . Drug Use: No  . Sexual Activity: Not on file   Other Topics Concern  . Not on file   Social History Narrative    Family History  Problem Relation Age of Onset  . Cancer Mother 78    leukemia  . Heart disease Father   . Heart attack Father 8    Review of Systems:  As stated in the HPI and otherwise negative.   BP 138/80 mmHg  Pulse 94  Ht 5\' 10"  (1.778 m)  Wt 259 lb 6.4 oz (117.663 kg)  BMI 37.22 kg/m2  SpO2 95%  Physical Examination: General: Well developed, well nourished, NAD HEENT: OP clear, mucus membranes moist SKIN: warm, dry. No rashes. Neuro: No focal deficits Musculoskeletal: Muscle strength 5/5 all ext Psychiatric: Mood and  affect normal Neck: No JVD, no carotid bruits, no thyromegaly, no lymphadenopathy. Lungs:Clear bilaterally, no wheezes, rhonci, crackles Cardiovascular: Regular rate and rhythm. No murmurs, gallops or rubs. Abdomen:Soft. Bowel sounds present. Non-tender.  Extremities: No lower extremity edema. Pulses are 2 + in the bilateral DP/PT  Echo 05/28/14: Left ventricle: The cavity size was normal. There was moderate concentric hypertrophy. Systolic function was moderately reduced. The estimated ejection fraction was in the range of 35% to 40%. There is akinesis of the mid-apicalanterior, inferoseptal, and apical myocardium. Features are consistent with a pseudonormal left ventricular filling pattern, with concomitant abnormal relaxation  and increased filling pressure (grade 2 diastolic dysfunction). - Mitral valve: There was mild regurgitation. - Left atrium: The atrium was mildly to moderately dilated. - Right atrium: The atrium was mildly dilated.  Assessment and Plan:   1. CAD: s/p CABG. Stable. Continue current meds. He is very active.   2. Atrial fibrillation: Sinus today. Continue Coreg for rate control and coumadin.   3. Ischemic cardiomyopathy: LVEF=35-40% by echo June 2015. Will continue medical management    4. Chronic systolic CHF: Volume status is ok today. Continue Lasix 40 mg po BID.   5. HTN: BP controlled. No changes  6. HLD: Lipids controlled. Continue statin.

## 2015-01-22 NOTE — Patient Instructions (Signed)
Your physician wants you to follow-up in:  6 months. You will receive a reminder letter in the mail two months in advance. If you don't receive a letter, please call our office to schedule the follow-up appointment.   

## 2015-02-05 ENCOUNTER — Ambulatory Visit (INDEPENDENT_AMBULATORY_CARE_PROVIDER_SITE_OTHER): Payer: Medicare Other | Admitting: *Deleted

## 2015-02-05 DIAGNOSIS — R809 Proteinuria, unspecified: Secondary | ICD-10-CM | POA: Diagnosis not present

## 2015-02-05 DIAGNOSIS — I10 Essential (primary) hypertension: Secondary | ICD-10-CM | POA: Diagnosis not present

## 2015-02-05 DIAGNOSIS — Z5181 Encounter for therapeutic drug level monitoring: Secondary | ICD-10-CM | POA: Diagnosis not present

## 2015-02-05 DIAGNOSIS — I4891 Unspecified atrial fibrillation: Secondary | ICD-10-CM | POA: Diagnosis not present

## 2015-02-05 LAB — POCT INR: INR: 2

## 2015-02-17 ENCOUNTER — Encounter: Payer: Self-pay | Admitting: Internal Medicine

## 2015-02-23 ENCOUNTER — Encounter: Payer: Self-pay | Admitting: Internal Medicine

## 2015-02-25 DIAGNOSIS — Z8546 Personal history of malignant neoplasm of prostate: Secondary | ICD-10-CM | POA: Diagnosis not present

## 2015-03-04 DIAGNOSIS — Z8546 Personal history of malignant neoplasm of prostate: Secondary | ICD-10-CM | POA: Diagnosis not present

## 2015-03-04 DIAGNOSIS — E291 Testicular hypofunction: Secondary | ICD-10-CM | POA: Diagnosis not present

## 2015-03-05 ENCOUNTER — Ambulatory Visit (INDEPENDENT_AMBULATORY_CARE_PROVIDER_SITE_OTHER): Payer: Medicare Other | Admitting: *Deleted

## 2015-03-05 DIAGNOSIS — Z5181 Encounter for therapeutic drug level monitoring: Secondary | ICD-10-CM

## 2015-03-05 DIAGNOSIS — I4891 Unspecified atrial fibrillation: Secondary | ICD-10-CM

## 2015-03-05 LAB — POCT INR: INR: 1.5

## 2015-03-09 ENCOUNTER — Other Ambulatory Visit: Payer: Self-pay | Admitting: *Deleted

## 2015-03-09 MED ORDER — GLIPIZIDE 5 MG PO TABS: 5.0000 mg | ORAL_TABLET | Freq: Every day | ORAL | Status: DC

## 2015-03-09 NOTE — Telephone Encounter (Signed)
Patient requested Refill. Faxed to pharmacy.

## 2015-03-19 ENCOUNTER — Ambulatory Visit (INDEPENDENT_AMBULATORY_CARE_PROVIDER_SITE_OTHER): Payer: Medicare Other | Admitting: *Deleted

## 2015-03-19 DIAGNOSIS — I4891 Unspecified atrial fibrillation: Secondary | ICD-10-CM | POA: Diagnosis not present

## 2015-03-19 DIAGNOSIS — Z5181 Encounter for therapeutic drug level monitoring: Secondary | ICD-10-CM

## 2015-03-19 LAB — POCT INR: INR: 1.9

## 2015-04-02 ENCOUNTER — Ambulatory Visit (INDEPENDENT_AMBULATORY_CARE_PROVIDER_SITE_OTHER): Payer: Medicare Other | Admitting: *Deleted

## 2015-04-02 DIAGNOSIS — Z5181 Encounter for therapeutic drug level monitoring: Secondary | ICD-10-CM | POA: Diagnosis not present

## 2015-04-02 DIAGNOSIS — I4891 Unspecified atrial fibrillation: Secondary | ICD-10-CM | POA: Diagnosis not present

## 2015-04-02 LAB — POCT INR: INR: 2.1

## 2015-04-07 DIAGNOSIS — R809 Proteinuria, unspecified: Secondary | ICD-10-CM | POA: Diagnosis not present

## 2015-04-17 ENCOUNTER — Other Ambulatory Visit: Payer: Medicare Other

## 2015-04-17 DIAGNOSIS — E1151 Type 2 diabetes mellitus with diabetic peripheral angiopathy without gangrene: Secondary | ICD-10-CM | POA: Diagnosis not present

## 2015-04-17 DIAGNOSIS — I10 Essential (primary) hypertension: Secondary | ICD-10-CM

## 2015-04-17 DIAGNOSIS — E1159 Type 2 diabetes mellitus with other circulatory complications: Secondary | ICD-10-CM

## 2015-04-18 LAB — COMPREHENSIVE METABOLIC PANEL
ALT: 17 IU/L (ref 0–44)
AST: 16 IU/L (ref 0–40)
Albumin/Globulin Ratio: 1.4 (ref 1.1–2.5)
Albumin: 3.9 g/dL (ref 3.5–4.8)
Alkaline Phosphatase: 62 IU/L (ref 39–117)
BUN/Creatinine Ratio: 19 (ref 10–22)
BUN: 19 mg/dL (ref 8–27)
Bilirubin Total: 0.6 mg/dL (ref 0.0–1.2)
CO2: 25 mmol/L (ref 18–29)
Calcium: 9.5 mg/dL (ref 8.6–10.2)
Chloride: 100 mmol/L (ref 97–108)
Creatinine, Ser: 1.01 mg/dL (ref 0.76–1.27)
GFR calc Af Amer: 86 mL/min/{1.73_m2} (ref >59)
GFR calc non Af Amer: 74 mL/min/{1.73_m2} (ref >59)
Globulin, Total: 2.8 g/dL (ref 1.5–4.5)
Glucose: 196 mg/dL — ABNORMAL HIGH (ref 65–99)
Potassium: 4.2 mmol/L (ref 3.5–5.2)
Sodium: 144 mmol/L (ref 134–144)
Total Protein: 6.7 g/dL (ref 6.0–8.5)

## 2015-04-18 LAB — MICROALBUMIN / CREATININE URINE RATIO
Creatinine, Urine: 55.4 mg/dL (ref 22.0–328.0)
MICROALB/CREAT RATIO: 504 mg/g creat — ABNORMAL HIGH (ref 0.0–30.0)
Microalbumin, Urine: 279.2 ug/mL — ABNORMAL HIGH (ref 0.0–17.0)

## 2015-04-18 LAB — HEMOGLOBIN A1C
Est. average glucose Bld gHb Est-mCnc: 220 mg/dL
Hgb A1c MFr Bld: 9.3 % — ABNORMAL HIGH (ref 4.8–5.6)

## 2015-04-28 ENCOUNTER — Ambulatory Visit: Payer: Medicare Other | Admitting: Internal Medicine

## 2015-04-30 ENCOUNTER — Encounter: Payer: Self-pay | Admitting: Nurse Practitioner

## 2015-04-30 ENCOUNTER — Ambulatory Visit (INDEPENDENT_AMBULATORY_CARE_PROVIDER_SITE_OTHER): Payer: Medicare Other

## 2015-04-30 ENCOUNTER — Ambulatory Visit (INDEPENDENT_AMBULATORY_CARE_PROVIDER_SITE_OTHER): Payer: Medicare Other | Admitting: Nurse Practitioner

## 2015-04-30 VITALS — BP 132/78 | HR 72 | Temp 97.7°F | Resp 18 | Ht 70.0 in | Wt 258.0 lb

## 2015-04-30 DIAGNOSIS — I4891 Unspecified atrial fibrillation: Secondary | ICD-10-CM

## 2015-04-30 DIAGNOSIS — I10 Essential (primary) hypertension: Secondary | ICD-10-CM | POA: Diagnosis not present

## 2015-04-30 DIAGNOSIS — Z5181 Encounter for therapeutic drug level monitoring: Secondary | ICD-10-CM

## 2015-04-30 DIAGNOSIS — N189 Chronic kidney disease, unspecified: Secondary | ICD-10-CM

## 2015-04-30 DIAGNOSIS — I255 Ischemic cardiomyopathy: Secondary | ICD-10-CM

## 2015-04-30 DIAGNOSIS — E1151 Type 2 diabetes mellitus with diabetic peripheral angiopathy without gangrene: Secondary | ICD-10-CM | POA: Diagnosis not present

## 2015-04-30 DIAGNOSIS — E1159 Type 2 diabetes mellitus with other circulatory complications: Secondary | ICD-10-CM

## 2015-04-30 LAB — POCT INR: INR: 1.9

## 2015-04-30 NOTE — Patient Instructions (Signed)
Need to make diet changes due to diabetes Please take and record blood sugars twice daily and follow up in 1 month with Cathey, Pharm D  Follow up in 3 months with Sherrie Mustache, NP for routine follow up   Diabetes Mellitus and Food It is important for you to manage your blood sugar (glucose) level. Your blood glucose level can be greatly affected by what you eat. Eating healthier foods in the appropriate amounts throughout the day at about the same time each day will help you control your blood glucose level. It can also help slow or prevent worsening of your diabetes mellitus. Healthy eating may even help you improve the level of your blood pressure and reach or maintain a healthy weight.  HOW CAN FOOD AFFECT ME? Carbohydrates Carbohydrates affect your blood glucose level more than any other type of food. Your dietitian will help you determine how many carbohydrates to eat at each meal and teach you how to count carbohydrates. Counting carbohydrates is important to keep your blood glucose at a healthy level, especially if you are using insulin or taking certain medicines for diabetes mellitus. Alcohol Alcohol can cause sudden decreases in blood glucose (hypoglycemia), especially if you use insulin or take certain medicines for diabetes mellitus. Hypoglycemia can be a life-threatening condition. Symptoms of hypoglycemia (sleepiness, dizziness, and disorientation) are similar to symptoms of having too much alcohol.  If your health care provider has given you approval to drink alcohol, do so in moderation and use the following guidelines:  Women should not have more than one drink per day, and men should not have more than two drinks per day. One drink is equal to:  12 oz of beer.  5 oz of wine.  1 oz of hard liquor.  Do not drink on an empty stomach.  Keep yourself hydrated. Have water, diet soda, or unsweetened iced tea.  Regular soda, juice, and other mixers might contain a lot of  carbohydrates and should be counted. WHAT FOODS ARE NOT RECOMMENDED? As you make food choices, it is important to remember that all foods are not the same. Some foods have fewer nutrients per serving than other foods, even though they might have the same number of calories or carbohydrates. It is difficult to get your body what it needs when you eat foods with fewer nutrients. Examples of foods that you should avoid that are high in calories and carbohydrates but low in nutrients include:  Trans fats (most processed foods list trans fats on the Nutrition Facts label).  Regular soda.  Juice.  Candy.  Sweets, such as cake, pie, doughnuts, and cookies.  Fried foods. WHAT FOODS CAN I EAT? Have nutrient-rich foods, which will nourish your body and keep you healthy. The food you should eat also will depend on several factors, including:  The calories you need.  The medicines you take.  Your weight.  Your blood glucose level.  Your blood pressure level.  Your cholesterol level. You also should eat a variety of foods, including:  Protein, such as meat, poultry, fish, tofu, nuts, and seeds (lean animal proteins are best).  Fruits.  Vegetables.  Dairy products, such as milk, cheese, and yogurt (low fat is best).  Breads, grains, pasta, cereal, rice, and beans.  Fats such as olive oil, trans fat-free margarine, canola oil, avocado, and olives. DOES EVERYONE WITH DIABETES MELLITUS HAVE THE SAME MEAL PLAN? Because every person with diabetes mellitus is different, there is not one meal plan that works  for everyone. It is very important that you meet with a dietitian who will help you create a meal plan that is just right for you. Document Released: 09/08/2005 Document Revised: 12/17/2013 Document Reviewed: 11/08/2013 [facility] Patient Information 2015 [place], [place]. This information is not intended to replace advice given to you by your health care provider. Make sure you discuss any  questions you have with your health care provider.

## 2015-04-30 NOTE — Progress Notes (Signed)
Patient ID: Seth [name], male   DOB: 08/27/1941, 74 y.o.   MRN: [id number]    PCP: Gildardo Cranker, DO  No Known Allergies  Chief Complaint  Patient presents with  . Medical Management of Chronic Issues    3 month follow-up, discuss labs (copy printed)   . Results    Discuss genetic test results      HPI: Patient is a 74 y.o. male seen in the office today for routine follow up on chronic conditions. Pt with pmh of HTN, diabetes, CKD, a fib, hyperlipidemia. Pt following with kidney doctor who has changed his diet and modified medications.  Following with coumadin clinic for INR. Following up ~2-3 weeks.  Has made lifestyle modifications, aware he has gained weight and needs to make more changes.   Advanced Directive information Does patient have an advance directive?: Yes Review of Systems:  Review of Systems  Constitutional: Negative for fever, chills, diaphoresis and activity change.  HENT: Negative for nosebleeds.   Respiratory: Negative for cough and shortness of breath.   Cardiovascular: Negative for chest pain, palpitations and leg swelling.  Gastrointestinal: Negative for nausea, vomiting, abdominal pain, diarrhea and constipation.  Genitourinary: Negative for dysuria and frequency.  Musculoskeletal: Negative for arthralgias and neck pain.  Skin: Negative for wound.  Neurological: Negative for dizziness and light-headedness.  Psychiatric/Behavioral: Negative for sleep disturbance and agitation.    Past Medical History  Diagnosis Date  . Impotence of organic origin   . CKD (chronic kidney disease)     proteinuria  . Type II or unspecified type diabetes mellitus with renal manifestations, uncontrolled   . Peripheral vascular disease, unspecified   . HTN (hypertension)   . Hemorrhage of rectum and anus   . Prostate cancer   . Thrombocytopenia, unspecified   . Tobacco use disorder   . Nephrolithiasis   . Hemorrhage of gastrointestinal tract, unspecified   . HLD  (hyperlipidemia)   . CAD (coronary artery disease)     a.  Lexiscan Myoview (02/14/14):  Apical, inferolateral scar; ? Mild ischemia; EF 32%.;  b.  LHC (02/21/14):  dLM 100%, LAD 100%, CFX 100%, dRCA 50%. => CABG (L-LAD, S-OM, S-PDA)  . Ischemic cardiomyopathy     a. Echo (02/17/14):  EF 35-40%, anteroseptal and apical AK, Gr 2 DD, MAC, mod LAE.  [name] Chronic systolic heart failure   . Atrial fibrillation     a. coumadin;  b. Amiodarone   Past Surgical History  Procedure Laterality Date  . Back surgery    . Cataract extraction  2010  . Coronary artery bypass graft N/A 02/24/2014    Procedure: Coronary artery bypass graft times three using left internal mammary artery and left leg greater saphenous vein harvested endoscopically.;  Surgeon: Gaye Pollack, MD;  Location: [facility] OR;  Service: Open Heart Surgery;  Laterality: N/A;  . Intraoperative transesophageal echocardiogram N/A 02/24/2014    Procedure: INTRAOPERATIVE TRANSESOPHAGEAL ECHOCARDIOGRAM;  Surgeon: Gaye Pollack, MD;  Location: [facility] OR;  Service: Open Heart Surgery;  Laterality: N/A;  . Clipping of atrial appendage Left 02/24/2014    Procedure: CLIPPING OF ATRIAL APPENDAGE;  Surgeon: Gaye Pollack, MD;  Location: [place];  Service: Open Heart Surgery;  Laterality: Left;  . Colonoscopy  2005    Normal  . Left heart catheterization with coronary angiogram N/A 02/21/2014    Procedure: LEFT HEART CATHETERIZATION WITH CORONARY ANGIOGRAM;  Surgeon: Peter M Martinique, MD;  Location: [facility] CATH LAB;  Service: Cardiovascular;  Laterality:  N/A;   Social History:   reports that he has never smoked. He quit smokeless tobacco use about 14 months ago. His smokeless tobacco use included Chew. He reports that he does not drink alcohol or use illicit drugs.  Family History  Problem Relation Age of Onset  . Cancer Mother 79    leukemia  . Heart disease Father   . Heart attack Father 13    Medications: Patient's Medications  New Prescriptions   No medications  on file  Previous Medications   ASPIRIN EC 81 MG TABLET    Take 1 tablet (81 mg total) by mouth daily.   ATORVASTATIN (LIPITOR) 40 MG TABLET    Take 1 tablet (40 mg total) by mouth daily.   CALCIUM CARBONATE-VITAMIN D 600-200 MG-UNIT TABS    Take 1 tablet by mouth daily. For bones    CARVEDILOL (COREG) 6.25 MG TABLET    Take 1 tablet (6.25 mg total) by mouth 2 (two) times daily with a meal.   FLAXSEED, LINSEED, (FLAXSEED OIL PO)    Take 1 tablet by mouth daily.    GLIPIZIDE (GLUCOTROL) 5 MG TABLET    Take 1 tablet (5 mg total) by mouth daily before breakfast.   METFORMIN (GLUCOPHAGE) 1000 MG TABLET    TAKE 1 TABLET BY MOUTH TWICE A DAY FOR DIABETES   MULTIPLE VITAMIN (MULTIVITAMINS PO)    Take 1 tablet by mouth daily.     POTASSIUM CHLORIDE SA (K-DUR,KLOR-CON) 10 MEQ TABLET    Take 1 tablet (10 mEq total) by mouth daily.   WARFARIN (COUMADIN) 5 MG TABLET    TAKE 1 TABLET BY MOUTH DAILY OR AS DIRECTED BY COUMADIN CLINIC  Modified Medications   Modified Medication Previous Medication   FUROSEMIDE (LASIX) 40 MG TABLET furosemide (LASIX) 40 MG tablet      1 by mouth in the am, 2 by mouth in the evening    Take 1 tablet (40 mg total) by mouth 2 (two) times daily.   LISINOPRIL (PRINIVIL,ZESTRIL) 20 MG TABLET lisinopril (PRINIVIL,ZESTRIL) 20 MG tablet      2 by mouth in the am, 2 by mouth in the pm per kidney doctor    Take 1 tablet (20 mg total) by mouth daily.  Discontinued Medications   No medications on file     Physical Exam:  Filed Vitals:   04/30/15 0825  BP: 132/78  Pulse: 72  Temp: 97.7 F (36.5 C)  TempSrc: Oral  Resp: 18  Height: 5\' 10"  (1.778 m)  Weight: 258 lb (117.028 kg)    Physical Exam  Constitutional: He is oriented to person, place, and time. He appears well-developed and well-nourished. No distress.  HENT:  Head: Normocephalic and atraumatic.  Cardiovascular: Normal rate and normal heart sounds.  An irregular rhythm present.  Pulmonary/Chest: Effort normal and  breath sounds normal.  Abdominal: Soft. Bowel sounds are normal.  Musculoskeletal: He exhibits no edema or tenderness.  Neurological: He is alert and oriented to person, place, and time.  Skin: Skin is warm and dry. He is not diaphoretic.  Psychiatric: He has a normal mood and affect.    Labs reviewed: Basic Metabolic Panel:  Recent Labs  08/29/14 0845  09/30/14 0937 01/19/15 1015 04/17/15 0825  NA 142  < > 141 142 144  K 3.9  < > 3.7 4.4 4.2  CL 102  < > 98 98 100  CO2 25  < > 25 21 25   GLUCOSE 149*  < >  288* 216* 196*  BUN 18  < > 18 17 19   CREATININE 1.02  < > 1.17 0.94 1.01  CALCIUM 9.1  < > 9.4 9.2 9.5  TSH 2.890  --   --   --   --   < > = values in this interval not displayed. Liver Function Tests:  Recent Labs  08/29/14 0845 01/19/15 1015 04/17/15 0825  AST 15 25 16   ALT 19 19 17   ALKPHOS 63 68 62  BILITOT 0.6 0.6 0.6  PROT 6.7 7.2 6.7   No results for input(s): LIPASE, AMYLASE in the last 8760 hours. No results for input(s): AMMONIA in the last 8760 hours. CBC:  Recent Labs  08/29/14 0845 01/19/15 1015  WBC 7.6 7.2  NEUTROABS 5.3 4.4  HGB 12.1* 13.4  HCT 37.2* 40.8  MCV 82 81  PLT  --  145*   Lipid Panel:  Recent Labs  05/23/14 0841 01/19/15 1015  CHOL 101 125  HDL 45 43  LDLCALC 40 52  TRIG 80 149  CHOLHDL 2.2 2.9   TSH:  Recent Labs  08/29/14 0845  TSH 2.890   A1C: Lab Results  Component Value Date   HGBA1C 9.3* 04/17/2015     Assessment/Plan 1. Hypertension, benign Controlled at this time, to cont current regimen. - Basic metabolic panel; Future  2. Atrial fibrillation, unspecified Rate controlled, cont current medication as well as anticoagulation which is being managed by coumadin clinic  - CBC with Differential/Platelet; Future  3. Type 2 diabetes mellitus with vascular disease -worsening glycemic control, pt adamant that he does not wish to start another medication. Reports he will change his diet and is  motivated to do so. -does not have blood sugar readings today but reports he does take them -to take cbgs twice daily and record -follow up in 1 month with [place], Pharm D in 1 month - Hemoglobin A1c; Future  4. CKD (chronic kidney disease), unspecified stage -following with nephrology due to proteinuria, lisinopril has been increased - Basic metabolic panel; Future  Follow up in 3 months

## 2015-05-06 ENCOUNTER — Other Ambulatory Visit: Payer: Self-pay | Admitting: Physician Assistant

## 2015-05-06 NOTE — Telephone Encounter (Signed)
[facility] was called/ spoke with sara/ Training and development officer. Sherrie Mustache NP is adjusting his lasix and should refill and adjust K+. I asked pharm tech to send to NP for refills. She agreed to plan.

## 2015-05-11 ENCOUNTER — Telehealth: Payer: Self-pay | Admitting: *Deleted

## 2015-05-11 NOTE — Telephone Encounter (Signed)
Patient called for potassium refill. He tells me that he gets a 76meq tablet and takes 0.5 tablet qd. His last ov list has potassium 74meq qd and his snapshot has both the 10 and the 66meq listed. Please advise. Thanks, MI

## 2015-05-13 NOTE — Telephone Encounter (Signed)
See refill note dated May 06, 2015. Please ask primary care to refill as they are adjusting his furosemide

## 2015-05-14 ENCOUNTER — Other Ambulatory Visit: Payer: Self-pay | Admitting: *Deleted

## 2015-05-14 ENCOUNTER — Encounter: Payer: Self-pay | Admitting: Internal Medicine

## 2015-05-14 MED ORDER — POTASSIUM CHLORIDE CRYS ER 20 MEQ PO TBCR: 20.0000 meq | EXTENDED_RELEASE_TABLET | Freq: Every day | ORAL | Status: DC

## 2015-05-14 NOTE — Telephone Encounter (Signed)
[facility]  

## 2015-05-14 NOTE — Telephone Encounter (Signed)
Patient aware to call pcp.

## 2015-05-28 ENCOUNTER — Ambulatory Visit (INDEPENDENT_AMBULATORY_CARE_PROVIDER_SITE_OTHER): Payer: Medicare Other

## 2015-05-28 DIAGNOSIS — Z5181 Encounter for therapeutic drug level monitoring: Secondary | ICD-10-CM | POA: Diagnosis not present

## 2015-05-28 DIAGNOSIS — I4891 Unspecified atrial fibrillation: Secondary | ICD-10-CM

## 2015-05-28 LAB — POCT INR: INR: 2.3

## 2015-06-08 ENCOUNTER — Ambulatory Visit: Payer: Medicare Other | Admitting: Pharmacotherapy

## 2015-06-10 DIAGNOSIS — I10 Essential (primary) hypertension: Secondary | ICD-10-CM | POA: Diagnosis not present

## 2015-06-10 DIAGNOSIS — R809 Proteinuria, unspecified: Secondary | ICD-10-CM | POA: Diagnosis not present

## 2015-06-22 ENCOUNTER — Other Ambulatory Visit: Payer: Self-pay

## 2015-06-26 ENCOUNTER — Ambulatory Visit (INDEPENDENT_AMBULATORY_CARE_PROVIDER_SITE_OTHER): Payer: Medicare Other | Admitting: *Deleted

## 2015-06-26 DIAGNOSIS — Z5181 Encounter for therapeutic drug level monitoring: Secondary | ICD-10-CM | POA: Diagnosis not present

## 2015-06-26 DIAGNOSIS — I4891 Unspecified atrial fibrillation: Secondary | ICD-10-CM

## 2015-06-26 LAB — POCT INR: INR: 2.8

## 2015-06-30 ENCOUNTER — Other Ambulatory Visit: Payer: Self-pay | Admitting: Internal Medicine

## 2015-06-30 ENCOUNTER — Other Ambulatory Visit: Payer: Self-pay | Admitting: Cardiovascular Disease

## 2015-07-31 ENCOUNTER — Ambulatory Visit (INDEPENDENT_AMBULATORY_CARE_PROVIDER_SITE_OTHER): Payer: Medicare Other | Admitting: *Deleted

## 2015-07-31 ENCOUNTER — Other Ambulatory Visit: Payer: Medicare Other

## 2015-07-31 DIAGNOSIS — I4891 Unspecified atrial fibrillation: Secondary | ICD-10-CM | POA: Diagnosis not present

## 2015-07-31 DIAGNOSIS — E1151 Type 2 diabetes mellitus with diabetic peripheral angiopathy without gangrene: Secondary | ICD-10-CM | POA: Diagnosis not present

## 2015-07-31 DIAGNOSIS — N189 Chronic kidney disease, unspecified: Secondary | ICD-10-CM

## 2015-07-31 DIAGNOSIS — Z5181 Encounter for therapeutic drug level monitoring: Secondary | ICD-10-CM | POA: Diagnosis not present

## 2015-07-31 DIAGNOSIS — E1159 Type 2 diabetes mellitus with other circulatory complications: Secondary | ICD-10-CM

## 2015-07-31 DIAGNOSIS — I10 Essential (primary) hypertension: Secondary | ICD-10-CM

## 2015-07-31 LAB — POCT INR: INR: 2.3

## 2015-08-01 LAB — BASIC METABOLIC PANEL
BUN/Creatinine Ratio: 19 (ref 10–22)
BUN: 17 mg/dL (ref 8–27)
CO2: 26 mmol/L (ref 18–29)
Calcium: 9.1 mg/dL (ref 8.6–10.2)
Chloride: 98 mmol/L (ref 97–108)
Creatinine, Ser: 0.9 mg/dL (ref 0.76–1.27)
GFR calc Af Amer: 98 mL/min/{1.73_m2} (ref >59)
GFR calc non Af Amer: 84 mL/min/{1.73_m2} (ref >59)
Glucose: 228 mg/dL — ABNORMAL HIGH (ref 65–99)
Potassium: 4.2 mmol/L (ref 3.5–5.2)
Sodium: 141 mmol/L (ref 134–144)

## 2015-08-01 LAB — CBC WITH DIFFERENTIAL/PLATELET
Basophils Absolute: 0 10*3/uL (ref 0.0–0.2)
Basos: 1 %
EOS (ABSOLUTE): 0.2 10*3/uL (ref 0.0–0.4)
Eos: 3 %
Hematocrit: 40.8 % (ref 37.5–51.0)
Hemoglobin: 13.7 g/dL (ref 12.6–17.7)
Immature Grans (Abs): 0 10*3/uL (ref 0.0–0.1)
Immature Granulocytes: 0 %
Lymphocytes Absolute: 1.6 10*3/uL (ref 0.7–3.1)
Lymphs: 24 %
MCH: 28.1 pg (ref 26.6–33.0)
MCHC: 33.6 g/dL (ref 31.5–35.7)
MCV: 84 fL (ref 79–97)
Monocytes Absolute: 0.7 10*3/uL (ref 0.1–0.9)
Monocytes: 11 %
Neutrophils Absolute: 4 10*3/uL (ref 1.4–7.0)
Neutrophils: 61 %
Platelets: 127 10*3/uL — ABNORMAL LOW (ref 150–379)
RBC: 4.87 x10E6/uL (ref 4.14–5.80)
RDW: 15.1 % (ref 12.3–15.4)
WBC: 6.5 10*3/uL (ref 3.4–10.8)

## 2015-08-01 LAB — HEMOGLOBIN A1C
Est. average glucose Bld gHb Est-mCnc: 232 mg/dL
Hgb A1c MFr Bld: 9.7 % — ABNORMAL HIGH (ref 4.8–5.6)

## 2015-08-04 ENCOUNTER — Ambulatory Visit (INDEPENDENT_AMBULATORY_CARE_PROVIDER_SITE_OTHER): Payer: Medicare Other | Admitting: Nurse Practitioner

## 2015-08-04 ENCOUNTER — Encounter: Payer: Self-pay | Admitting: Nurse Practitioner

## 2015-08-04 VITALS — BP 154/84 | HR 84 | Temp 97.6°F | Resp 20 | Ht 70.0 in | Wt 258.4 lb

## 2015-08-04 DIAGNOSIS — I255 Ischemic cardiomyopathy: Secondary | ICD-10-CM | POA: Diagnosis not present

## 2015-08-04 DIAGNOSIS — I4891 Unspecified atrial fibrillation: Secondary | ICD-10-CM | POA: Diagnosis not present

## 2015-08-04 DIAGNOSIS — D649 Anemia, unspecified: Secondary | ICD-10-CM

## 2015-08-04 DIAGNOSIS — E1151 Type 2 diabetes mellitus with diabetic peripheral angiopathy without gangrene: Secondary | ICD-10-CM

## 2015-08-04 DIAGNOSIS — I5022 Chronic systolic (congestive) heart failure: Secondary | ICD-10-CM

## 2015-08-04 DIAGNOSIS — I10 Essential (primary) hypertension: Secondary | ICD-10-CM

## 2015-08-04 DIAGNOSIS — E785 Hyperlipidemia, unspecified: Secondary | ICD-10-CM | POA: Diagnosis not present

## 2015-08-04 DIAGNOSIS — N189 Chronic kidney disease, unspecified: Secondary | ICD-10-CM | POA: Diagnosis not present

## 2015-08-04 DIAGNOSIS — E1159 Type 2 diabetes mellitus with other circulatory complications: Secondary | ICD-10-CM

## 2015-08-04 NOTE — Progress Notes (Signed)
Patient ID: Seth [name], male   DOB: 1941-02-22, 74 y.o.   MRN: [id number]    PCP: Gildardo Cranker, DO  No Known Allergies  Chief Complaint  Patient presents with  . Medical Management of Chronic Issues     HPI: Patient is a 74 y.o. male seen in the office today to follow up on chronic conditions. Pt with a pmh of diabetes, dm, CKD, a fib, hyperlipidemia. Reports he knows what he is supposed to do but has not made the changes. Reports his kidney doctor is following his diabetes and does not want [place] changing his medication- does not know which nephrologist he sees.  Reports it too hot for exercise.  Feels like his diet is not bad. Did not follow up with [facility], reports he was never going to.  conts on Lipitor 40 mg daily  Following with coumadin clinic for anticoagulation- taking 5mg  Tuesday, Thursday and Saturday and 7.5 on all other days.  Following cardiologist due to CAD, a fib, CHF, htn Taking blood pressure at home.  Ranging from [id number] Reports compliance with medication and that he knows what he is supposed to be doing just does not do it sometimes.  No concerns today Review of Systems:  Review of Systems  Constitutional: Negative for fever, chills, diaphoresis and activity change.  HENT: Negative for nosebleeds.   Respiratory: Negative for cough and shortness of breath.   Cardiovascular: Negative for chest pain, palpitations and leg swelling.  Gastrointestinal: Negative for nausea, vomiting, abdominal pain, diarrhea and constipation.  Genitourinary: Negative for dysuria and frequency.  Musculoskeletal: Negative for arthralgias and neck pain.  Skin: Negative for wound.  Neurological: Negative for dizziness and light-headedness.  Psychiatric/Behavioral: Negative for sleep disturbance and agitation.    Past Medical History  Diagnosis Date  . Impotence of organic origin   . CKD (chronic kidney disease)     proteinuria  . Type II or unspecified type diabetes  mellitus with renal manifestations, uncontrolled   . Peripheral vascular disease, unspecified   . HTN (hypertension)   . Hemorrhage of rectum and anus   . Prostate cancer   . Thrombocytopenia, unspecified   . Tobacco use disorder   . Nephrolithiasis   . Hemorrhage of gastrointestinal tract, unspecified   . HLD (hyperlipidemia)   . CAD (coronary artery disease)     a.  Lexiscan Myoview (02/14/14):  Apical, inferolateral scar; ? Mild ischemia; EF 32%.;  b.  LHC (02/21/14):  dLM 100%, LAD 100%, CFX 100%, dRCA 50%. => CABG (L-LAD, S-OM, S-PDA)  . Ischemic cardiomyopathy     a. Echo (02/17/14):  EF 35-40%, anteroseptal and apical AK, Gr 2 DD, MAC, mod LAE.  [name] Chronic systolic heart failure   . Atrial fibrillation     a. coumadin;  b. Amiodarone   Past Surgical History  Procedure Laterality Date  . Back surgery    . Cataract extraction  2010  . Coronary artery bypass graft N/A 02/24/2014    Procedure: Coronary artery bypass graft times three using left internal mammary artery and left leg greater saphenous vein harvested endoscopically.;  Surgeon: Gaye Pollack, MD;  Location: [facility] OR;  Service: Open Heart Surgery;  Laterality: N/A;  . Intraoperative transesophageal echocardiogram N/A 02/24/2014    Procedure: INTRAOPERATIVE TRANSESOPHAGEAL ECHOCARDIOGRAM;  Surgeon: Gaye Pollack, MD;  Location: [facility] OR;  Service: Open Heart Surgery;  Laterality: N/A;  . Clipping of atrial appendage Left 02/24/2014    Procedure: CLIPPING OF ATRIAL APPENDAGE;  Surgeon:  Gaye Pollack, MD;  Location: [place];  Service: Open Heart Surgery;  Laterality: Left;  . Colonoscopy  2005    Normal  . Left heart catheterization with coronary angiogram N/A 02/21/2014    Procedure: LEFT HEART CATHETERIZATION WITH CORONARY ANGIOGRAM;  Surgeon: Peter M Martinique, MD;  Location: [facility] CATH LAB;  Service: Cardiovascular;  Laterality: N/A;   Social History:   reports that he has never smoked. He quit smokeless tobacco use about 17 months ago.  His smokeless tobacco use included Chew. He reports that he does not drink alcohol or use illicit drugs.  Family History  Problem Relation Age of Onset  . Cancer Mother 72    leukemia  . Heart disease Father   . Heart attack Father 42    Medications: Patient's Medications  New Prescriptions   No medications on file  Previous Medications   ASPIRIN EC 81 MG TABLET    Take 1 tablet (81 mg total) by mouth daily.   ATORVASTATIN (LIPITOR) 40 MG TABLET    Take 1 tablet (40 mg total) by mouth daily.   CALCIUM CARBONATE-VITAMIN D 600-200 MG-UNIT TABS    Take 1 tablet by mouth daily. For bones    CARVEDILOL (COREG) 6.25 MG TABLET    Take 1 tablet (6.25 mg total) by mouth 2 (two) times daily with a meal.   FLAXSEED, LINSEED, (FLAXSEED OIL PO)    Take 1 tablet by mouth daily.    FUROSEMIDE (LASIX) 40 MG TABLET    1 by mouth in the am, 2 by mouth in the evening   GLIPIZIDE (GLUCOTROL) 5 MG TABLET    TAKE 1 TABLET (5 MG TOTAL) BY MOUTH DAILY BEFORE BREAKFAST.   LISINOPRIL (PRINIVIL,ZESTRIL) 20 MG TABLET    2 by mouth in the am, 2 by mouth in the pm per kidney doctor   METFORMIN (GLUCOPHAGE) 1000 MG TABLET    TAKE 1 TABLET BY MOUTH TWICE A DAY FOR DIABETES   MULTIPLE VITAMIN (MULTIVITAMINS PO)    Take 1 tablet by mouth daily.     POTASSIUM CHLORIDE SA (K-DUR,KLOR-CON) 20 MEQ TABLET    Take 1 tablet (20 mEq total) by mouth daily.   WARFARIN (COUMADIN) 5 MG TABLET    TAKE 1 TABLET BY MOUTH DAILY OR AS DIRECTED BY COUMADIN CLINIC  Modified Medications   No medications on file  Discontinued Medications   METFORMIN (GLUCOPHAGE) 1000 MG TABLET    TAKE 1 TABLET BY MOUTH TWICE A DAY FOR DIABETES     Physical Exam:  Filed Vitals:   08/04/15 0833  BP: 154/84  Pulse: 84  Temp: 97.6 F (36.4 C)  TempSrc: Oral  Resp: 20  Height: 5\' 10"  (1.778 m)  Weight: 258 lb 6.4 oz (117.209 kg)  SpO2: 96%    Physical Exam  Constitutional: He is oriented to person, place, and time. No distress.  HENT:    Head: Normocephalic and atraumatic.  Mouth/Throat: Oropharynx is clear and moist. No oropharyngeal exudate.  Neck: Normal range of motion. Neck supple.  Cardiovascular: Normal rate and normal heart sounds.  An irregular rhythm present.  Pulmonary/Chest: Effort normal and breath sounds normal.  Abdominal: Soft. Bowel sounds are normal.  obese  Musculoskeletal: He exhibits no edema or tenderness.  Neurological: He is alert and oriented to person, place, and time.  Skin: Skin is warm and dry. He is not diaphoretic.  Psychiatric: He has a normal mood and affect.    Labs reviewed: Basic Metabolic Panel:  Recent Labs  08/29/14 0845  01/19/15 1015 04/17/15 0825 07/31/15 0825  NA 142  < > 142 144 141  K 3.9  < > 4.4 4.2 4.2  CL 102  < > 98 100 98  CO2 25  < > 21 25 26   GLUCOSE 149*  < > 216* 196* 228*  BUN 18  < > 17 19 17   CREATININE 1.02  < > 0.94 1.01 0.90  CALCIUM 9.1  < > 9.2 9.5 9.1  TSH 2.890  --   --   --   --   < > = values in this interval not displayed. Liver Function Tests:  Recent Labs  08/29/14 0845 01/19/15 1015 04/17/15 0825  AST 15 25 16   ALT 19 19 17   ALKPHOS 63 68 62  BILITOT 0.6 0.6 0.6  PROT 6.7 7.2 6.7   No results for input(s): LIPASE, AMYLASE in the last 8760 hours. No results for input(s): AMMONIA in the last 8760 hours. CBC:  Recent Labs  08/29/14 0845 01/19/15 1015 07/31/15 0825  WBC 7.6 7.2 6.5  NEUTROABS 5.3 4.4 4.0  HGB 12.1* 13.4  --   HCT 37.2* 40.8 40.8  MCV 82 81  --   PLT  --  145*  --       Lipid Panel:  Recent Labs  01/19/15 1015  CHOL 125  HDL 43  LDLCALC 52  TRIG 149  CHOLHDL 2.9   TSH:  Recent Labs  08/29/14 0845  TSH 2.890   A1C: Lab Results  Component Value Date   HGBA1C 9.7* 07/31/2015     Assessment/Plan 1. Type 2 diabetes mellitus with vascular disease Not controlled. Pt reports nephrologist does not want PCP changing his medication because he is working with this diabetes. There has been  no recent medication changes.  Pt reports he knows what he needs to do with diet and exercise and just needs to do it.  -will not start or change medication at this time -declines any information on diabetes reports he already has it  2. CKD (chronic kidney disease), unspecified stage Following with nephrologist at this time. BUN and Cr stable.  -discuss need for proper blood pressure and diabetic control due to kidney disease    3. Atrial fibrillation, unspecified Rate controlled, conts on coreg and coumadin -INR conts to be managed by coumadin clinic   4. Hyperlipidemia LDL at goal in January, will cont Lipitor 40 mg daily   5. Hypertension, benign Blood pressure overall not at goal, will not start another medication or allow for dose adjustments but reports he will work on exercise and dietary modifications.  Information given on cardiac diet.   6. Anemia, unspecified anemia type hgb stable at 13.7 on recent cbc   7. Chronic systolic CHF -euvolemic, conts on lasix 20 mg in the am and 40 mg in the pm. No worsening shortness of breath or chest pain.  - echo done June 2015, EF  35-40%  Follow up in 6 weeks with blood pressure reading and CBGs with Dr Thayer Headings K. Harle Battiest  [facility] [phone] 8 am - 5 pm) [phone] (after hours)  CC: Dr Mercy Moore, nephrologist

## 2015-08-04 NOTE — Patient Instructions (Addendum)
You diabetes and blood pressure are poorly controlled  Need to make dietary and exercise modification or medication adjustments  To follow up with Dr Eulas Post in 6 weeks   Cardiac Diet This diet can help prevent heart disease and stroke. Many factors influence your heart health, including eating and exercise habits. Coronary risk rises a lot with abnormal blood fat (lipid) levels. Cardiac meal planning includes limiting unhealthy fats, increasing healthy fats, and making other small dietary changes. General guidelines are as follows:  Adjust calorie intake to reach and maintain desirable body weight.  Limit total fat intake to less than 30% of total calories. Saturated fat should be less than 7% of calories.  Saturated fats are found in animal products and in some vegetable products. Saturated vegetable fats are found in coconut oil, cocoa butter, palm oil, and palm kernel oil. Read labels carefully to avoid these products as much as possible. Use butter in moderation. Choose tub margarines and oils that have 2 grams of fat or less. Good cooking oils are canola and olive oils.  Practice low-fat cooking techniques. Do not fry food. Instead, broil, bake, boil, steam, grill, roast on a rack, stir-fry, or microwave it. Other fat reducing suggestions include:  Remove the skin from poultry.  Remove all visible fat from meats.  Skim the fat off stews, soups, and gravies before serving them.  Steam vegetables in water or broth instead of sauting them in fat.  Avoid foods with trans fat (or hydrogenated oils), such as commercially fried foods and commercially baked goods. Commercial shortening and deep-frying fats will contain trans fat.  Increase intake of fruits, vegetables, whole grains, and legumes to replace foods high in fat.  Increase consumption of nuts, legumes, and seeds to at least 4 servings weekly. One serving of a legume equals  cup, and 1 serving of nuts or seeds equals   cup.  Choose whole grains more often. Have 3 servings per day (a serving is 1 ounce [oz]).  Eat 4 to 5 servings of vegetables per day. A serving of vegetables is 1 cup of raw leafy vegetables;  cup of raw or cooked cut-up vegetables;  cup of vegetable juice.  Eat 4 to 5 servings of fruit per day. A serving of fruit is 1 medium whole fruit;  cup of dried fruit;  cup of fresh, frozen, or canned fruit;  cup of 100% fruit juice.  Increase your intake of dietary fiber to 20 to 30 grams per day. Insoluble fiber may help lower your risk of heart disease and may help curb your appetite.  Soluble fiber binds cholesterol to be removed from the blood. Foods high in soluble fiber are dried beans, citrus fruits, oats, apples, bananas, broccoli, Brussels sprouts, and eggplant.  Try to include foods fortified with plant sterols or stanols, such as yogurt, breads, juices, or margarines. Choose several fortified foods to achieve a daily intake of 2 to 3 grams of plant sterols or stanols.  Foods with omega-3 fats can help reduce your risk of heart disease. Aim to have a 3.5 oz portion of fatty fish twice per week, such as salmon, mackerel, albacore tuna, sardines, lake trout, or herring. If you wish to take a fish oil supplement, choose one that contains 1 gram of both DHA and EPA.  Limit processed meats to 2 servings (3 oz portion) weekly.  Limit the sodium in your diet to 1500 milligrams (mg) per day. If you have high blood pressure, talk to a registered  dietitian about a DASH (Dietary Approaches to Stop Hypertension) eating plan.  Limit sweets and beverages with added sugar, such as soda, to no more than 5 servings per week. One serving is:   1 tablespoon sugar.  1 tablespoon jelly or jam.   cup sorbet.  1 cup lemonade.   cup regular soda. CHOOSING FOODS Starches  Allowed: Breads: All kinds (wheat, rye, raisin, white, oatmeal, [place], [place], and English muffin bread). Low-fat rolls:  English muffins, frankfurter and hamburger buns, bagels, pita bread, tortillas (not fried). Pancakes, waffles, biscuits, and muffins made with recommended oil.  Avoid: Products made with saturated or trans fats, oils, or whole milk products. Butter rolls, cheese breads, croissants. Commercial doughnuts, muffins, sweet rolls, biscuits, waffles, pancakes, store-bought mixes. Crackers  Allowed: Low-fat crackers and snacks: Animal, graham, rye, saltine (with recommended oil, no lard), oyster, and matzo crackers. Bread sticks, melba toast, rusks, flatbread, pretzels, and light popcorn.  Avoid: High-fat crackers: cheese crackers, butter crackers, and those made with coconut, palm oil, or trans fat (hydrogenated oils). Buttered popcorn. Cereals  Allowed: Hot or cold whole-grain cereals.  Avoid: Cereals containing coconut, hydrogenated vegetable fat, or animal fat. Potatoes / Pasta / Rice  Allowed: All kinds of potatoes, rice, and pasta (such as macaroni, spaghetti, and noodles).  Avoid: Pasta or rice prepared with cream sauce or high-fat cheese. Chow mein noodles, [place] fries. Vegetables  Allowed: All vegetables and vegetable juices.  Avoid: Fried vegetables. Vegetables in cream, butter, or high-fat cheese sauces. Limit coconut. Fruit in cream or custard. Protein  Allowed: Limit your intake of meat, seafood, and poultry to no more than 6 oz (cooked weight) per day. All lean, well-trimmed beef, veal, pork, and lamb. All chicken and [place] without skin. All fish and shellfish. Wild game: wild duck, rabbit, pheasant, and venison. Egg whites or low-cholesterol egg substitutes may be used as desired. Meatless dishes: recipes with dried beans, peas, lentils, and tofu (soybean curd). Seeds and nuts: all seeds and most nuts.  Avoid: Prime grade and other heavily marbled and fatty meats, such as short ribs, spare ribs, rib eye roast or steak, frankfurters, sausage, bacon, and high-fat luncheon meats,  mutton. Caviar. Commercially fried fish. Domestic duck, goose, venison sausage. Organ meats: liver, gizzard, heart, chitterlings, brains, kidney, sweetbreads. Dairy  Allowed: Low-fat cheeses: nonfat or low-fat cottage cheese (1% or 2% fat), cheeses made with part skim milk, such as mozzarella, farmers, string, or ricotta. (Cheeses should be labeled no more than 2 to 6 grams fat per oz.). Skim (or 1%) milk: liquid, powdered, or evaporated. Buttermilk made with low-fat milk. Drinks made with skim or low-fat milk or cocoa. Chocolate milk or cocoa made with skim or low-fat (1%) milk. Nonfat or low-fat yogurt.  Avoid: Whole milk cheeses, including colby, cheddar, mu[place], Monterey Jack, [place], [place], [place], American, Swiss, and blue. Creamed cottage cheese, cream cheese. Whole milk and whole milk products, including buttermilk or yogurt made from whole milk, drinks made from whole milk. Condensed milk, evaporated whole milk, and 2% milk. Soups and Combination Foods  Allowed: Low-fat low-sodium soups: broth, dehydrated soups, homemade broth, soups with the fat removed, homemade cream soups made with skim or low-fat milk. Low-fat spaghetti, lasagna, chili, and Spanish rice if low-fat ingredients and low-fat cooking techniques are used.  Avoid: Cream soups made with whole milk, cream, or high-fat cheese. All other soups. Desserts and Sweets  Allowed: Sherbet, fruit ices, gelatins, meringues, and angel food cake. Homemade desserts with recommended fats, oils, and milk  products. Jam, jelly, honey, marmalade, sugars, and syrups. Pure sugar candy, such as gum drops, hard candy, jelly beans, marshmallows, mints, and small amounts of dark chocolate.  Avoid: Commercially prepared cakes, pies, cookies, frosting, pudding, or mixes for these products. Desserts containing whole milk products, chocolate, coconut, lard, palm oil, or palm kernel oil. Ice cream or ice cream drinks. Candy that contains chocolate,  coconut, butter, hydrogenated fat, or unknown ingredients. Buttered syrups. Fats and Oils  Allowed: Vegetable oils: safflower, sunflower, corn, soybean, cottonseed, sesame, canola, olive, or peanut. Non-hydrogenated margarines. Salad dressing or mayonnaise: homemade or commercial, made with a recommended oil. Low or nonfat salad dressing or mayonnaise.  Limit added fats and oils to 6 to 8 tsp per day (includes fats used in cooking, baking, salads, and spreads on bread). Remember to count the "hidden fats" in foods.  Avoid: Solid fats and shortenings: butter, lard, salt pork, bacon drippings. Gravy containing meat fat, shortening, or suet. Cocoa butter, coconut. Coconut oil, palm oil, palm kernel oil, or hydrogenated oils: these ingredients are often used in bakery products, nondairy creamers, whipped toppings, candy, and commercially fried foods. Read labels carefully. Salad dressings made of unknown oils, sour cream, or cheese, such as blue cheese and Roquefort. Cream, all kinds: half-and-half, light, heavy, or whipping. Sour cream or cream cheese (even if "light" or low-fat). Nondairy cream substitutes: coffee creamers and sour cream substitutes made with palm, palm kernel, hydrogenated oils, or coconut oil. Beverages  Allowed: Coffee (regular or decaffeinated), tea. Diet carbonated beverages, mineral water. Alcohol: Check with your caregiver. Moderation is recommended.  Avoid: Whole milk, regular sodas, and juice drinks with added sugar. Condiments  Allowed: All seasonings and condiments. Cocoa powder. "Cream" sauces made with recommended ingredients.  Avoid: Carob powder made with hydrogenated fats. SAMPLE MENU Breakfast   cup orange juice   cup oatmeal  1 slice toast  1 tsp margarine  1 cup skim milk Lunch  [place] sandwich with 2 oz [place], 2 slices bread  Lettuce and tomato slices  Fresh fruit  Carrot sticks  Coffee or tea Snack  Fresh fruit or low-fat  crackers Dinner  3 oz lean ground beef  1 baked potato  1 tsp margarine   cup asparagus  Lettuce salad  1 tbs non-creamy dressing   cup peach slices  1 cup skim milk Document Released: 09/20/2008 Document Revised: 06/12/2012 Document Reviewed: 02/11/2014 Exit[employer] Patient Information 2015 [place], [place]. This information is not intended to replace advice given to you by your health care provider. Make sure you discuss any questions you have with your health care provider.

## 2015-08-12 NOTE — Progress Notes (Signed)
Chief Complaint  Patient presents with  . Follow-up     History of Present Illness: 74 yo male with a history of DM, HTN, HLD, CAD, prostate CA, renal insufficiency, obesity here today for cardiac follow up. He was seen as a new patient 02/12/14 for evaluation of atrial fib. He was placed on Xarelto. He was set up for echo and stress test. Stress test was abnormal with apical and inferolateral scar and ischemia, LVEF of 32%. Echo 02/17/14 with LVEF=35-40% with akinesis of the mid-distalanteroseptal and apical myocardium, MAC, dilated LA. Cardiac cath 02/21/14 with occluded left main, moderate RCA stenosis with the entire left system filling from right to left collaterals. 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 He was discharged on amiodarone and coumadin. Echo 05/28/14 with LVEF=35-40%, concentric LVH, mild MR. Seen by Dr. Mare Ferrari as DOD add on for w/u of worsened SOB, noted while in coumadin clinic. Primary care workup included chest x-ray which showed small bilateral pleural effusions left greater than right. His BNP was elevated at 680. He was advised to increase his furosemide and diuresed well with appropriate weight loss. His breathing and orthopnea improved.   He is here today for follow up. No chest pain or SOB. Weighing daily and stable weights at home.   Primary Care Physician: Bubba Camp  Last Lipid Profile:Lipid Panel     Component Value Date/Time   CHOL 125 01/19/2015 1015   TRIG 149 01/19/2015 1015   HDL 43 01/19/2015 1015   CHOLHDL 2.9 01/19/2015 1015   LDLCALC 52 01/19/2015 1015    Past Medical History  Diagnosis Date  . Impotence of organic origin   . CKD (chronic kidney disease)     proteinuria  . Type II or unspecified type diabetes mellitus with renal manifestations, uncontrolled   . Peripheral vascular disease, unspecified   . HTN (hypertension)   . Hemorrhage of rectum and anus   . Prostate cancer   . Thrombocytopenia, unspecified   .  Tobacco use disorder   . Nephrolithiasis   . Hemorrhage of gastrointestinal tract, unspecified   . HLD (hyperlipidemia)   . CAD (coronary artery disease)     a.  Lexiscan Myoview (02/14/14):  Apical, inferolateral scar; ? Mild ischemia; EF 32%.;  b.  LHC (02/21/14):  dLM 100%, LAD 100%, CFX 100%, dRCA 50%. => CABG (L-LAD, S-OM, S-PDA)  . Ischemic cardiomyopathy     a. Echo (02/17/14):  EF 35-40%, anteroseptal and apical AK, Gr 2 DD, MAC, mod LAE.  [name] Chronic systolic heart failure   . Atrial fibrillation     a. coumadin;  b. Amiodarone    Past Surgical History  Procedure Laterality Date  . Back surgery    . Cataract extraction  2010  . Coronary artery bypass graft N/A 02/24/2014    Procedure: Coronary artery bypass graft times three using left internal mammary artery and left leg greater saphenous vein harvested endoscopically.;  Surgeon: Gaye Pollack, MD;  Location: [facility] OR;  Service: Open Heart Surgery;  Laterality: N/A;  . Intraoperative transesophageal echocardiogram N/A 02/24/2014    Procedure: INTRAOPERATIVE TRANSESOPHAGEAL ECHOCARDIOGRAM;  Surgeon: Gaye Pollack, MD;  Location: [facility] OR;  Service: Open Heart Surgery;  Laterality: N/A;  . Clipping of atrial appendage Left 02/24/2014    Procedure: CLIPPING OF ATRIAL APPENDAGE;  Surgeon: Gaye Pollack, MD;  Location: [facility];  Service: Open Heart Surgery;  Laterality: Left;  . Colonoscopy  2005  Normal  . Left heart catheterization with coronary angiogram N/A 02/21/2014    Procedure: LEFT HEART CATHETERIZATION WITH CORONARY ANGIOGRAM;  Surgeon: Peter M Martinique, MD;  Location: [facility] CATH LAB;  Service: Cardiovascular;  Laterality: N/A;    Current Outpatient Prescriptions  Medication Sig Dispense Refill  . aspirin EC 81 MG tablet Take 1 tablet (81 mg total) by mouth daily.    [name] atorvastatin (LIPITOR) 40 MG tablet Take 1 tablet (40 mg total) by mouth daily. 90 tablet 3  . Calcium Carbonate-Vitamin D 600-200 MG-UNIT TABS Take 1 tablet by mouth  daily. For bones     . carvedilol (COREG) 6.25 MG tablet Take 1 tablet (6.25 mg total) by mouth 2 (two) times daily with a meal. 180 tablet 3  . Flaxseed, Linseed, (FLAXSEED OIL PO) Take 1 tablet by mouth daily.     . furosemide (LASIX) 40 MG tablet 40 mg. 1 tablet by mouth in the am, 2 by mouth in the evening 90 tablet 0  . glipiZIDE (GLUCOTROL) 5 MG tablet TAKE 1 TABLET (5 MG TOTAL) BY MOUTH DAILY BEFORE BREAKFAST. 30 tablet 3  . lisinopril (PRINIVIL,ZESTRIL) 20 MG tablet Take 20 mg by mouth. 2 tablets by mouth in the am, 2 tablets by mouth in the pm per kidney doctor 30 tablet 0  . metFORMIN (GLUCOPHAGE) 1000 MG tablet TAKE 1 TABLET BY MOUTH TWICE A DAY FOR DIABETES 60 tablet 5  . Multiple Vitamin (MULTIVITAMINS PO) Take 1 tablet by mouth daily.      . potassium chloride SA (K-DUR,KLOR-CON) 20 MEQ tablet Take 1 tablet (20 mEq total) by mouth daily. 90 tablet 0  . warfarin (COUMADIN) 5 MG tablet TAKE 1 TABLET BY MOUTH DAILY OR AS DIRECTED BY COUMADIN CLINIC 45 tablet 3   No current facility-administered medications for this visit.    No Known Allergies  Social History   Social History  . Marital Status: Married    Spouse Name: N/A  . Number of Children: 3  . Years of Education: N/A   Occupational History  . Sell storage buildings    Social History Main Topics  . Smoking status: Never Smoker   . Smokeless tobacco: Former Systems developer    Types: Chew    Quit date: 02/23/2014  . Alcohol Use: No  . Drug Use: No  . Sexual Activity: Not on file   Other Topics Concern  . Not on file   Social History Narrative    Family History  Problem Relation Age of Onset  . Cancer Mother 66    leukemia  . Heart disease Father   . Heart attack Father 68    Review of Systems:  As stated in the HPI and otherwise negative.   BP 158/64 mmHg  Pulse 76  Ht 5\' 10"  (1.778 m)  Wt 254 lb 6.4 oz (115.395 kg)  BMI 36.50 kg/m2  SpO2 96%  Physical Examination: General: Well developed, well  nourished, NAD HEENT: OP clear, mucus membranes moist SKIN: warm, dry. No rashes. Neuro: No focal deficits Musculoskeletal: Muscle strength 5/5 all ext Psychiatric: Mood and affect normal Neck: No JVD, no carotid bruits, no thyromegaly, no lymphadenopathy. Lungs:Clear bilaterally, no wheezes, rhonci, crackles Cardiovascular: Regular rate and rhythm. No murmurs, gallops or rubs. Abdomen:Soft. Bowel sounds present. Non-tender.  Extremities: No lower extremity edema. Pulses are 2 + in the bilateral DP/PT  Echo 05/28/14: Left ventricle: The cavity size was normal. There was moderate concentric hypertrophy. Systolic function was moderately reduced. The estimated  ejection fraction was in the range of 35% to 40%. There is akinesis of the mid-apicalanterior, inferoseptal, and apical myocardium. Features are consistent with a pseudonormal left ventricular filling pattern, with concomitant abnormal relaxation and increased filling pressure (grade 2 diastolic dysfunction). - Mitral valve: There was mild regurgitation. - Left atrium: The atrium was mildly to moderately dilated. - Right atrium: The atrium was mildly dilated.  EKG:  EKG is not ordered today. The ekg ordered today demonstrates   Recent Labs: 08/29/2014: TSH 2.890 09/23/2014: Pro B Natriuretic peptide (BNP) 642.0* 09/30/2014: BNP 393.5* 01/19/2015: Hemoglobin 13.4; Platelets 145* 04/17/2015: ALT 17 07/31/2015: BUN 17; Creatinine, Ser 0.90; Potassium 4.2; Sodium 141   Lipid Panel    Component Value Date/Time   CHOL 125 01/19/2015 1015   TRIG 149 01/19/2015 1015   HDL 43 01/19/2015 1015   CHOLHDL 2.9 01/19/2015 1015   LDLCALC 52 01/19/2015 1015     Wt Readings from Last 3 Encounters:  08/13/15 254 lb 6.4 oz (115.395 kg)  08/04/15 258 lb 6.4 oz (117.209 kg)  04/30/15 258 lb (117.028 kg)     Other studies Reviewed: Additional studies/ records that were reviewed today include: . Review of the above records demonstrates:     Assessment and Plan:   1. CAD: s/p CABG. Stable. Continue current meds. He is very active.   2. Atrial fibrillation: Sinus today. Continue Coreg for rate control and coumadin.   3. Ischemic cardiomyopathy: LVEF=35-40% by echo June 2015. Will continue medical management    4. Chronic systolic CHF: Volume status is ok today. Continue Lasix 80 mg am, 40 mg pm.   5. HTN: BP controlled at home. No changes  6. HLD: Lipids controlled. Continue statin.   Current medicines are reviewed at length with the patient today.  The patient does not have concerns regarding medicines.  The following changes have been made:  no change  Labs/ tests ordered today include:  No orders of the defined types were placed in this encounter.    Disposition:   FU with me in 6 months  Signed, Lauree Chandler, MD 08/13/2015 9:36 AM    [facility] [address], [place], [place]  [address] Phone: [phone]; Fax: [phone]

## 2015-08-13 ENCOUNTER — Encounter: Payer: Self-pay | Admitting: Cardiovascular Disease

## 2015-08-13 ENCOUNTER — Ambulatory Visit (INDEPENDENT_AMBULATORY_CARE_PROVIDER_SITE_OTHER): Payer: Medicare Other | Admitting: Cardiovascular Disease

## 2015-08-13 VITALS — BP 158/64 | HR 76 | Ht 70.0 in | Wt 254.4 lb

## 2015-08-13 DIAGNOSIS — E785 Hyperlipidemia, unspecified: Secondary | ICD-10-CM | POA: Diagnosis not present

## 2015-08-13 DIAGNOSIS — I480 Paroxysmal atrial fibrillation: Secondary | ICD-10-CM | POA: Diagnosis not present

## 2015-08-13 DIAGNOSIS — I2510 Atherosclerotic heart disease of native coronary artery without angina pectoris: Secondary | ICD-10-CM

## 2015-08-13 DIAGNOSIS — I255 Ischemic cardiomyopathy: Secondary | ICD-10-CM

## 2015-08-13 DIAGNOSIS — I5022 Chronic systolic (congestive) heart failure: Secondary | ICD-10-CM

## 2015-08-13 DIAGNOSIS — I10 Essential (primary) hypertension: Secondary | ICD-10-CM

## 2015-08-13 NOTE — Patient Instructions (Signed)

## 2015-09-08 ENCOUNTER — Ambulatory Visit (INDEPENDENT_AMBULATORY_CARE_PROVIDER_SITE_OTHER): Payer: Medicare Other | Admitting: *Deleted

## 2015-09-08 DIAGNOSIS — I4891 Unspecified atrial fibrillation: Secondary | ICD-10-CM

## 2015-09-08 DIAGNOSIS — I480 Paroxysmal atrial fibrillation: Secondary | ICD-10-CM

## 2015-09-08 DIAGNOSIS — Z5181 Encounter for therapeutic drug level monitoring: Secondary | ICD-10-CM

## 2015-09-08 LAB — POCT INR: INR: 1.1

## 2015-09-15 ENCOUNTER — Ambulatory Visit (INDEPENDENT_AMBULATORY_CARE_PROVIDER_SITE_OTHER): Payer: Medicare Other

## 2015-09-15 DIAGNOSIS — I480 Paroxysmal atrial fibrillation: Secondary | ICD-10-CM | POA: Diagnosis not present

## 2015-09-15 DIAGNOSIS — I4891 Unspecified atrial fibrillation: Secondary | ICD-10-CM | POA: Diagnosis not present

## 2015-09-15 DIAGNOSIS — Z5181 Encounter for therapeutic drug level monitoring: Secondary | ICD-10-CM | POA: Diagnosis not present

## 2015-09-15 LAB — POCT INR: INR: 1.7

## 2015-09-16 DIAGNOSIS — Z8546 Personal history of malignant neoplasm of prostate: Secondary | ICD-10-CM | POA: Diagnosis not present

## 2015-09-23 ENCOUNTER — Ambulatory Visit: Payer: BLUE CROSS/BLUE SHIELD | Admitting: Internal Medicine

## 2015-09-23 DIAGNOSIS — E291 Testicular hypofunction: Secondary | ICD-10-CM | POA: Diagnosis not present

## 2015-09-23 DIAGNOSIS — Z87442 Personal history of urinary calculi: Secondary | ICD-10-CM | POA: Diagnosis not present

## 2015-09-23 DIAGNOSIS — Z8546 Personal history of malignant neoplasm of prostate: Secondary | ICD-10-CM | POA: Diagnosis not present

## 2015-09-29 ENCOUNTER — Telehealth: Payer: Self-pay | Admitting: Pharmacist

## 2015-09-29 ENCOUNTER — Other Ambulatory Visit: Payer: Self-pay | Admitting: Internal Medicine

## 2015-09-29 ENCOUNTER — Ambulatory Visit (INDEPENDENT_AMBULATORY_CARE_PROVIDER_SITE_OTHER): Payer: Medicare Other | Admitting: *Deleted

## 2015-09-29 ENCOUNTER — Other Ambulatory Visit: Payer: Self-pay | Admitting: *Deleted

## 2015-09-29 DIAGNOSIS — I4891 Unspecified atrial fibrillation: Secondary | ICD-10-CM | POA: Diagnosis not present

## 2015-09-29 DIAGNOSIS — Z5181 Encounter for therapeutic drug level monitoring: Secondary | ICD-10-CM

## 2015-09-29 DIAGNOSIS — I480 Paroxysmal atrial fibrillation: Secondary | ICD-10-CM | POA: Diagnosis not present

## 2015-09-29 LAB — POCT INR: INR: 2.1

## 2015-09-29 NOTE — Telephone Encounter (Signed)
Patients medication snapshot indicates that the patient takes furosemide 40mg  qam and 80mg  qpm. Pharmacy is requesting 80mg  qam and 40mg  qpm. Please advise. Thanks, MI

## 2015-09-29 NOTE — Telephone Encounter (Signed)
Received a call from [facility] notifying us that the manufacturer of Seth [name] Coumadin will be changing. Patient has already ok'ed this change. Has a f/u Coumadin check scheduled this month.

## 2015-09-30 ENCOUNTER — Other Ambulatory Visit: Payer: Self-pay | Admitting: *Deleted

## 2015-09-30 IMAGING — CR DG CHEST 1V PORT
1 series · 1 of 1 positions shown · non-contrast
Comparison: 02/23/2014.

CLINICAL DATA: Postop CABG.

EXAM:
PORTABLE CHEST - 1 VIEW

[AP]
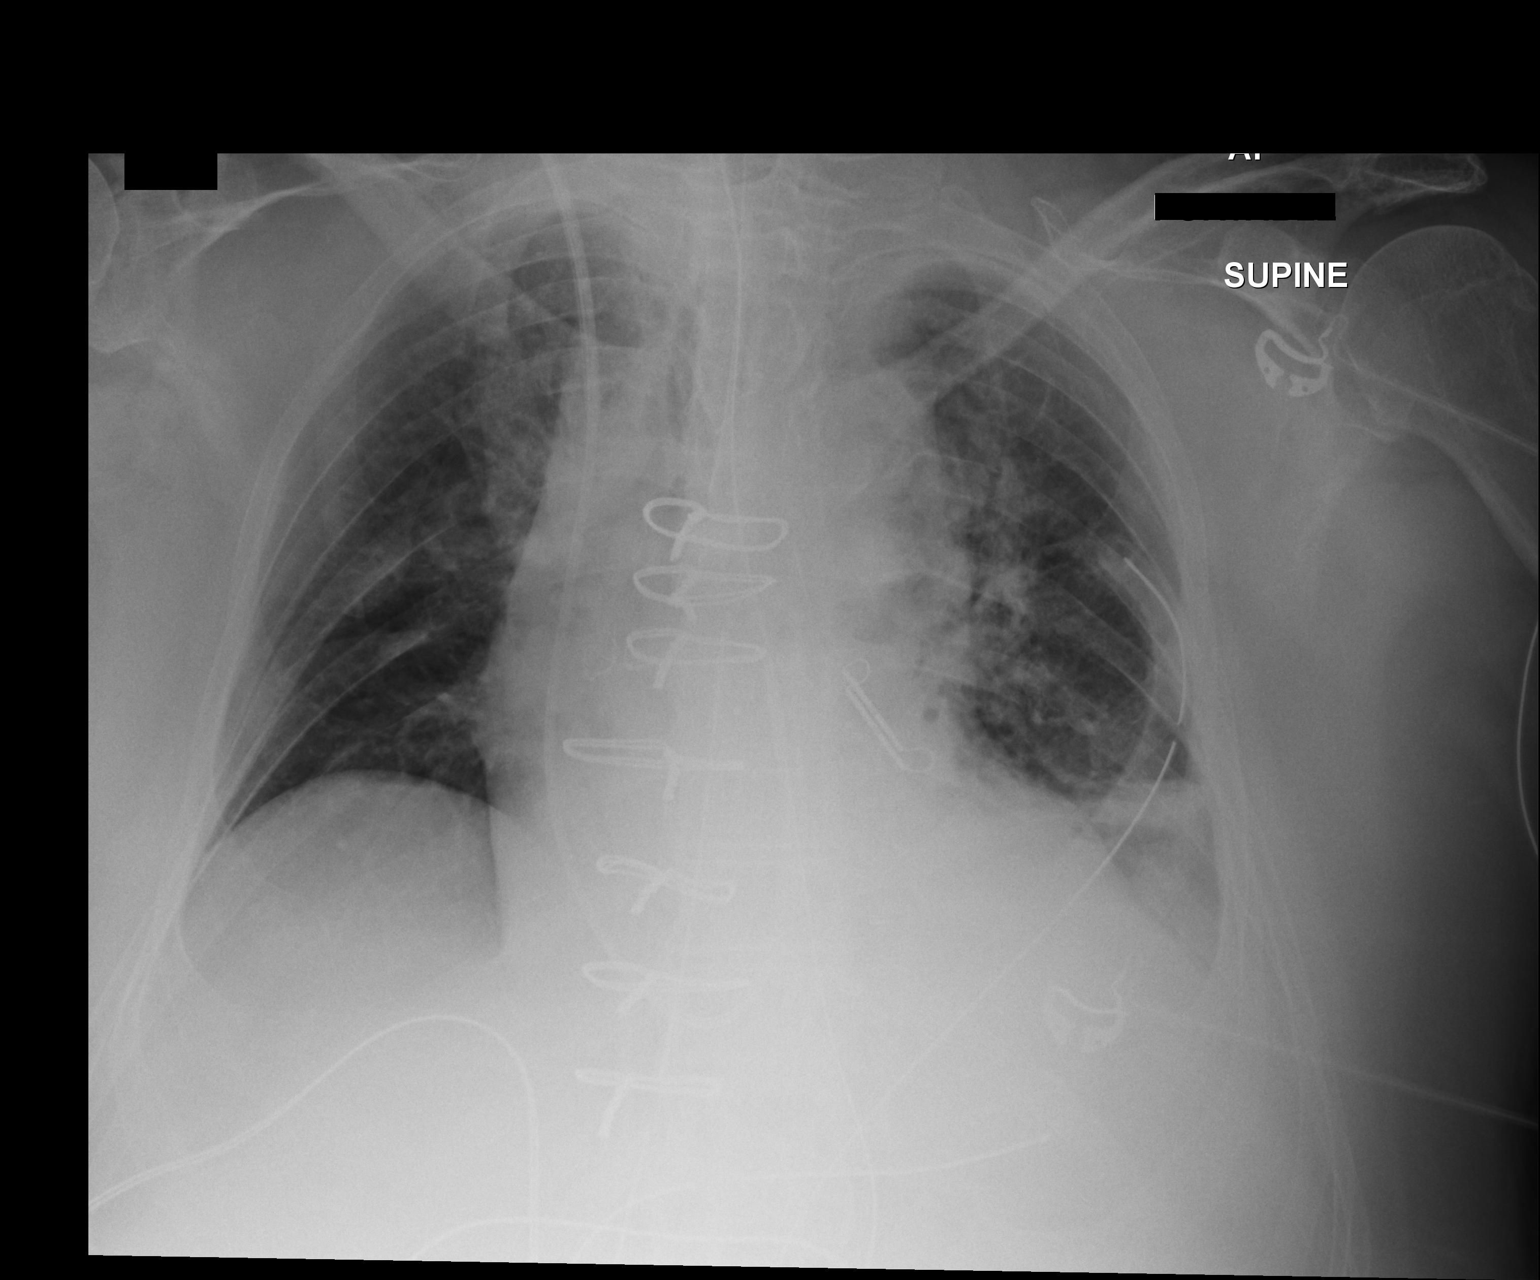

[1 of 1 positions shown; findings below may reference images not displayed]

FINDINGS: 0664 hr. The carina is not well visualized. The tip of the
endotracheal tube appears to be within 2 cm of the expected location
of the carina. A nasogastric tube projects below the diaphragm.
There is a right IJ Swan-Ganz catheter with its tip in the right
ventricular outflow tract. A left chest tube is in place.

There are low lung volumes with mild left basilar atelectasis. No
pneumothorax or definite edema is present. The heart size and
mediastinal contours are stable status post interval CABG.
IMPRESSION: Mild left basilar atelectasis following CABG. No evidence of
pneumothorax.

## 2015-09-30 MED ORDER — FUROSEMIDE 40 MG PO TABS: ORAL_TABLET | ORAL | Status: DC

## 2015-09-30 NOTE — Telephone Encounter (Signed)
Would fill the way pharmacy is requesting and he can continue taking that dose. Thanks, chris

## 2015-10-02 ENCOUNTER — Encounter: Payer: Self-pay | Admitting: Internal Medicine

## 2015-10-02 ENCOUNTER — Ambulatory Visit (INDEPENDENT_AMBULATORY_CARE_PROVIDER_SITE_OTHER): Payer: Medicare Other | Admitting: Internal Medicine

## 2015-10-02 VITALS — BP 116/86 | HR 86 | Temp 97.6°F | Resp 20 | Ht 70.0 in | Wt 255.8 lb

## 2015-10-02 DIAGNOSIS — Z23 Encounter for immunization: Secondary | ICD-10-CM | POA: Diagnosis not present

## 2015-10-02 DIAGNOSIS — I255 Ischemic cardiomyopathy: Secondary | ICD-10-CM

## 2015-10-02 DIAGNOSIS — E785 Hyperlipidemia, unspecified: Secondary | ICD-10-CM | POA: Diagnosis not present

## 2015-10-02 DIAGNOSIS — I10 Essential (primary) hypertension: Secondary | ICD-10-CM | POA: Diagnosis not present

## 2015-10-02 DIAGNOSIS — E1143 Type 2 diabetes mellitus with diabetic autonomic (poly)neuropathy: Secondary | ICD-10-CM | POA: Diagnosis not present

## 2015-10-02 MED ORDER — GLIPIZIDE 5 MG PO TABS: ORAL_TABLET | ORAL | Status: DC

## 2015-10-02 NOTE — Progress Notes (Signed)
Patient ID: Seth [name], male   DOB: Oct 29, 1941, 74 y.o.   MRN: [id number]    Location:    [facility]   Place of Service:   OFFICE  Chief Complaint  Patient presents with  . Medical Management of Chronic Issues    6 week follow-up for Hypertension, DM    HPI:  74 yo male seen today for f/u of DM and HTN. He has attempted to control BS without changing meds. FBS 150-170s, occasionally 180-210s. No low BS reactions. He eats reduced complex CHO including wheat bread. Last A1c 9.7% He tries to walk on treadmill 30 min per day but has had a hard time walking over the last month due to knee pain.   Past Medical History  Diagnosis Date  . Impotence of organic origin   . CKD (chronic kidney disease)     proteinuria  . Type II or unspecified type diabetes mellitus with renal manifestations, uncontrolled   . Peripheral vascular disease, unspecified ([place])   . HTN (hypertension)   . Hemorrhage of rectum and anus   . Prostate cancer ([place])   . Thrombocytopenia, unspecified ([place])   . Tobacco use disorder   . Nephrolithiasis   . Hemorrhage of gastrointestinal tract, unspecified   . HLD (hyperlipidemia)   . CAD (coronary artery disease)     a.  Lexiscan Myoview (02/14/14):  Apical, inferolateral scar; ? Mild ischemia; EF 32%.;  b.  LHC (02/21/14):  dLM 100%, LAD 100%, CFX 100%, dRCA 50%. => CABG (L-LAD, S-OM, S-PDA)  . Ischemic cardiomyopathy     a. Echo (02/17/14):  EF 35-40%, anteroseptal and apical AK, Gr 2 DD, MAC, mod LAE.  [name] Chronic systolic heart failure ([place])   . Atrial fibrillation ([place])     a. coumadin;  b. Amiodarone    Past Surgical History  Procedure Laterality Date  . Back surgery    . Cataract extraction  2010  . Coronary artery bypass graft N/A 02/24/2014    Procedure: Coronary artery bypass graft times three using left internal mammary artery and left leg greater saphenous vein harvested endoscopically.;  Surgeon: Gaye Pollack, MD;  Location: [facility];  Service: Open Heart Surgery;   Laterality: N/A;  . Intraoperative transesophageal echocardiogram N/A 02/24/2014    Procedure: INTRAOPERATIVE TRANSESOPHAGEAL ECHOCARDIOGRAM;  Surgeon: Gaye Pollack, MD;  Location: [facility] OR;  Service: Open Heart Surgery;  Laterality: N/A;  . Clipping of atrial appendage Left 02/24/2014    Procedure: CLIPPING OF ATRIAL APPENDAGE;  Surgeon: Gaye Pollack, MD;  Location: [place];  Service: Open Heart Surgery;  Laterality: Left;  . Colonoscopy  2005    Normal  . Left heart catheterization with coronary angiogram N/A 02/21/2014    Procedure: LEFT HEART CATHETERIZATION WITH CORONARY ANGIOGRAM;  Surgeon: Peter M Martinique, MD;  Location: [facility] CATH LAB;  Service: Cardiovascular;  Laterality: N/A;    Patient Care Team: Gildardo Cranker, DO as PCP - General (Internal Medicine) [name], MD as Attending Physician (Urology) [name], MD as Consulting Physician (Nephrology)  Social History   Social History  . Marital Status: Married    Spouse Name: N/A  . Number of Children: 3  . Years of Education: N/A   Occupational History  . Sell storage buildings    Social History Main Topics  . Smoking status: Never Smoker   . Smokeless tobacco: Former Systems developer    Types: Chew    Quit date: 02/23/2014  . Alcohol Use: No  . Drug  Use: No  . Sexual Activity: Not on file   Other Topics Concern  . Not on file   Social History Narrative     reports that he has never smoked. He quit smokeless tobacco use about 19 months ago. His smokeless tobacco use included Chew. He reports that he does not drink alcohol or use illicit drugs.  No Known Allergies  Medications: Patient's Medications  New Prescriptions   No medications on file  Previous Medications   ASPIRIN EC 81 MG TABLET    Take 1 tablet (81 mg total) by mouth daily.   ATORVASTATIN (LIPITOR) 40 MG TABLET    TAKE 1 TABLET BY MOUTH DAILY.   CALCIUM CARBONATE-VITAMIN D 600-200 MG-UNIT TABS    Take 1 tablet by mouth daily. For bones    CARVEDILOL (COREG)  6.25 MG TABLET    Take 1 tablet (6.25 mg total) by mouth 2 (two) times daily with a meal.   FLAXSEED, LINSEED, (FLAXSEED OIL PO)    Take 1 tablet by mouth daily.    FUROSEMIDE (LASIX) 40 MG TABLET    Take 2 tablets by mouth in the morning and 1 tablet by mouth in the evening   GLIPIZIDE (GLUCOTROL) 5 MG TABLET    TAKE 1 TABLET (5 MG TOTAL) BY MOUTH DAILY BEFORE BREAKFAST.   LISINOPRIL (PRINIVIL,ZESTRIL) 20 MG TABLET    Take 20 mg by mouth. 2 tablets by mouth in the am, 2 tablets by mouth in the pm per kidney doctor   METFORMIN (GLUCOPHAGE) 1000 MG TABLET    TAKE 1 TABLET BY MOUTH TWICE A DAY FOR DIABETES   MULTIPLE VITAMIN (MULTIVITAMINS PO)    Take 1 tablet by mouth daily.     POTASSIUM CHLORIDE SA (K-DUR,KLOR-CON) 20 MEQ TABLET    Take 1 tablet (20 mEq total) by mouth daily.   WARFARIN (COUMADIN) 5 MG TABLET    TAKE 1 TABLET BY MOUTH DAILY OR AS DIRECTED BY COUMADIN CLINIC  Modified Medications   No medications on file  Discontinued Medications   No medications on file    Review of Systems  Constitutional: Negative for chills, activity change and fatigue.  HENT: Negative for sore throat and trouble swallowing.   Eyes: Negative for visual disturbance.  Respiratory: Negative for cough, chest tightness and shortness of breath.   Cardiovascular: Negative for chest pain, palpitations and leg swelling.  Gastrointestinal: Negative for nausea, vomiting, abdominal pain and blood in stool.  Genitourinary: Negative for urgency, frequency and difficulty urinating.  Musculoskeletal: Negative for arthralgias and gait problem.  Skin: Negative for rash.  Neurological: Positive for numbness. Negative for weakness and headaches.  Psychiatric/Behavioral: Negative for confusion and sleep disturbance. The patient is not nervous/anxious.     Filed Vitals:   10/02/15 0855  BP: 116/86  Pulse: 86  Temp: 97.6 F (36.4 C)  TempSrc: Oral  Resp: 20  Height: 5\' 10"  (1.778 m)  Weight: 255 lb 12.8 oz (116.03  kg)  SpO2: 96%   Body mass index is 36.7 kg/(m^2).  Physical Exam  Constitutional: He is oriented to person, place, and time. He appears well-developed and well-nourished.  HENT:  Mouth/Throat: Oropharynx is clear and moist.  Eyes: Pupils are equal, round, and reactive to light. No scleral icterus.  Neck: Neck supple. Carotid bruit is not present. No thyromegaly present.  Cardiovascular: Normal rate, regular rhythm, normal heart sounds and intact distal pulses.  Exam reveals no gallop and no friction rub.   No murmur heard. no  distal LE swelling. No calf TTP  Pulmonary/Chest: Effort normal and breath sounds normal. He has no wheezes. He has no rales. He exhibits no tenderness.  Abdominal: Soft. Bowel sounds are normal. He exhibits no distension, no abdominal bruit, no pulsatile midline mass and no mass. There is no tenderness. There is no rebound and no guarding.  Lymphadenopathy:    He has no cervical adenopathy.  Neurological: He is alert and oriented to person, place, and time. He has normal reflexes.  Skin: Skin is warm and dry. No rash noted.  Varicose veins in legs.  Psychiatric: He has a normal mood and affect. His behavior is normal. Judgment and thought content normal.     Labs reviewed: Anti-coag visit on 09/29/2015  Component Date Value Ref Range Status  . INR 09/29/2015 2.1   Final  Anti-coag visit on 09/15/2015  Component Date Value Ref Range Status  . INR 09/15/2015 1.7   Final  Anti-coag visit on 09/08/2015  Component Date Value Ref Range Status  . INR 09/08/2015 1.1   Final  Anti-coag visit on 07/31/2015  Component Date Value Ref Range Status  . INR 07/31/2015 2.3   Final  Appointment on 07/31/2015  Component Date Value Ref Range Status  . Glucose 07/31/2015 228* 65 - 99 mg/dL Final  . BUN 07/31/2015 17  8 - 27 mg/dL Final  . Creatinine, Ser 07/31/2015 0.90  0.76 - 1.27 mg/dL Final  . GFR calc non Af Amer 07/31/2015 84  >59 mL/min/1.73 Final  . GFR calc Af  Amer 07/31/2015 98  >59 mL/min/1.73 Final  . BUN/Creatinine Ratio 07/31/2015 19  10 - 22 Final  . Sodium 07/31/2015 141  134 - 144 mmol/L Final  . Potassium 07/31/2015 4.2  3.5 - 5.2 mmol/L Final  . Chloride 07/31/2015 98  97 - 108 mmol/L Final  . CO2 07/31/2015 26  18 - 29 mmol/L Final  . Calcium 07/31/2015 9.1  8.6 - 10.2 mg/dL Final  . WBC 07/31/2015 6.5  3.4 - 10.8 x10E3/uL Final  . RBC 07/31/2015 4.87  4.14 - 5.80 x10E6/uL Final  . Hemoglobin 07/31/2015 13.7  12.6 - 17.7 g/dL Final  . Hematocrit 07/31/2015 40.8  37.5 - 51.0 % Final  . MCV 07/31/2015 84  79 - 97 fL Final  . MCH 07/31/2015 28.1  26.6 - 33.0 pg Final  . MCHC 07/31/2015 33.6  31.5 - 35.7 g/dL Final  . RDW 07/31/2015 15.1  12.3 - 15.4 % Final  . Platelets 07/31/2015 127* 150 - 379 x10E3/uL Final  . Neutrophils 07/31/2015 61   Final  . Lymphs 07/31/2015 24   Final  . Monocytes 07/31/2015 11   Final  . Eos 07/31/2015 3   Final  . Basos 07/31/2015 1   Final  . Neutrophils Absolute 07/31/2015 4.0  1.4 - 7.0 x10E3/uL Final  . Lymphocytes Absolute 07/31/2015 1.6  0.7 - 3.1 x10E3/uL Final  . Monocytes Absolute 07/31/2015 0.7  0.1 - 0.9 x10E3/uL Final  . EOS (ABSOLUTE) 07/31/2015 0.2  0.0 - 0.4 x10E3/uL Final  . Basophils Absolute 07/31/2015 0.0  0.0 - 0.2 x10E3/uL Final  . Immature Granulocytes 07/31/2015 0   Final  . Immature Grans (Abs) 07/31/2015 0.0  0.0 - 0.1 x10E3/uL Final  . Hgb A1c MFr Bld 07/31/2015 9.7* 4.8 - 5.6 % Final   Comment:          Pre-diabetes: 5.7 - 6.4          Diabetes: >6.4  Glycemic control for adults with diabetes: <7.0   . Est. average glucose Bld gHb Est-m* 07/31/2015 232   Final    No results found.   Assessment/Plan   ICD-9-CM ICD-10-CM   1. Type 2 diabetes mellitus with diabetic autonomic neuropathy, without long-term current use of insulin (HCC) - uncontrolled 250.60 E11.43 glipiZIDE (GLUCOTROL) 5 MG tablet   [id number]  Basic Metabolic Panel     ALT     Lipid Panel      Hemoglobin A1c  2. Hypertension, benign - stable 401.1 99991111 Basic Metabolic Panel  3. Hyperlipidemia - stable 272.4 E78.5 Lipid Panel    Increase glipizide to 2 tabs in AM and 1 tab in PM  Continue other medications as ordered  Encouraged to exercise as tolerated. Watch complex CHO  Follow up with Sherrie Mustache in 2 mos for routine visit. Fasting labs 2-3 days prior to appt   [place] S. Perlie Gold  [facility] [address] [place], [place] [address] [phone] Cell (Monday-Friday 8 AM - 5 PM) [phone] After 5 PM and follow prompts

## 2015-10-02 NOTE — Patient Instructions (Addendum)
Increase glipizide to 2 tabs in AM and 1 tab in PM  Continue other medications as ordered  Follow up with Sherrie Mustache in 2 mos for routine visit. Fasting labs 2-3 days prior to appt

## 2015-10-20 ENCOUNTER — Ambulatory Visit (INDEPENDENT_AMBULATORY_CARE_PROVIDER_SITE_OTHER): Payer: Medicare Other | Admitting: *Deleted

## 2015-10-20 DIAGNOSIS — Z5181 Encounter for therapeutic drug level monitoring: Secondary | ICD-10-CM | POA: Diagnosis not present

## 2015-10-20 DIAGNOSIS — I480 Paroxysmal atrial fibrillation: Secondary | ICD-10-CM

## 2015-10-20 DIAGNOSIS — I4891 Unspecified atrial fibrillation: Secondary | ICD-10-CM | POA: Diagnosis not present

## 2015-10-20 LAB — POCT INR: INR: 2.4

## 2015-11-09 ENCOUNTER — Other Ambulatory Visit: Payer: Self-pay | Admitting: Nurse Practitioner

## 2015-11-24 ENCOUNTER — Ambulatory Visit (INDEPENDENT_AMBULATORY_CARE_PROVIDER_SITE_OTHER): Payer: Medicare Other | Admitting: Pharmacist

## 2015-11-24 DIAGNOSIS — Z5181 Encounter for therapeutic drug level monitoring: Secondary | ICD-10-CM | POA: Diagnosis not present

## 2015-11-24 DIAGNOSIS — I4891 Unspecified atrial fibrillation: Secondary | ICD-10-CM

## 2015-11-24 DIAGNOSIS — I480 Paroxysmal atrial fibrillation: Secondary | ICD-10-CM

## 2015-11-24 LAB — POCT INR: INR: 2.4

## 2015-12-01 ENCOUNTER — Other Ambulatory Visit: Payer: Medicare Other

## 2015-12-01 DIAGNOSIS — I10 Essential (primary) hypertension: Secondary | ICD-10-CM | POA: Diagnosis not present

## 2015-12-01 DIAGNOSIS — E785 Hyperlipidemia, unspecified: Secondary | ICD-10-CM

## 2015-12-01 DIAGNOSIS — E1143 Type 2 diabetes mellitus with diabetic autonomic (poly)neuropathy: Secondary | ICD-10-CM

## 2015-12-02 LAB — LIPID PANEL
Chol/HDL Ratio: 2.9 ratio units (ref 0.0–5.0)
Cholesterol, Total: 103 mg/dL (ref 100–199)
HDL: 36 mg/dL — ABNORMAL LOW (ref >39)
LDL Calculated: 35 mg/dL (ref 0–99)
Triglycerides: 159 mg/dL — ABNORMAL HIGH (ref 0–149)
VLDL Cholesterol Cal: 32 mg/dL (ref 5–40)

## 2015-12-02 LAB — BASIC METABOLIC PANEL
BUN/Creatinine Ratio: 17 (ref 10–22)
BUN: 16 mg/dL (ref 8–27)
CO2: 28 mmol/L (ref 18–29)
Calcium: 8.7 mg/dL (ref 8.6–10.2)
Chloride: 100 mmol/L (ref 97–106)
Creatinine, Ser: 0.94 mg/dL (ref 0.76–1.27)
GFR calc Af Amer: 93 mL/min/{1.73_m2} (ref >59)
GFR calc non Af Amer: 80 mL/min/{1.73_m2} (ref >59)
Glucose: 224 mg/dL — ABNORMAL HIGH (ref 65–99)
Potassium: 4.2 mmol/L (ref 3.5–5.2)
Sodium: 141 mmol/L (ref 136–144)

## 2015-12-02 LAB — HEMOGLOBIN A1C
Est. average glucose Bld gHb Est-mCnc: 235 mg/dL
Hgb A1c MFr Bld: 9.8 % — ABNORMAL HIGH (ref 4.8–5.6)

## 2015-12-02 LAB — ALT: ALT: 22 IU/L (ref 0–44)

## 2015-12-03 ENCOUNTER — Encounter: Payer: Self-pay | Admitting: Nurse Practitioner

## 2015-12-03 ENCOUNTER — Ambulatory Visit (INDEPENDENT_AMBULATORY_CARE_PROVIDER_SITE_OTHER): Payer: Medicare Other | Admitting: Nurse Practitioner

## 2015-12-03 VITALS — BP 130/80 | HR 86 | Temp 98.0°F | Resp 20 | Ht 70.0 in | Wt 253.8 lb

## 2015-12-03 DIAGNOSIS — Z794 Long term (current) use of insulin: Secondary | ICD-10-CM

## 2015-12-03 DIAGNOSIS — E1122 Type 2 diabetes mellitus with diabetic chronic kidney disease: Secondary | ICD-10-CM | POA: Diagnosis not present

## 2015-12-03 DIAGNOSIS — H60392 Other infective otitis externa, left ear: Secondary | ICD-10-CM | POA: Diagnosis not present

## 2015-12-03 DIAGNOSIS — N189 Chronic kidney disease, unspecified: Secondary | ICD-10-CM | POA: Diagnosis not present

## 2015-12-03 DIAGNOSIS — I10 Essential (primary) hypertension: Secondary | ICD-10-CM

## 2015-12-03 DIAGNOSIS — E785 Hyperlipidemia, unspecified: Secondary | ICD-10-CM

## 2015-12-03 DIAGNOSIS — I255 Ischemic cardiomyopathy: Secondary | ICD-10-CM

## 2015-12-03 MED ORDER — NEOMYCIN-POLYMYXIN-HC 3.5-10000-1 OT SOLN: 4.0000 [drp] | Freq: Four times a day (QID) | OTIC | Status: DC

## 2015-12-03 NOTE — Progress Notes (Signed)
Patient ID: Seth [name], male   DOB: 1941/12/18, 74 y.o.   MRN: [id number]     PCP: Gildardo Cranker, DO  No Known Allergies  Chief Complaint  Patient presents with  . Medical Management of Chronic Issues    left ear feels stopped up     HPI: Patient is a 74 y.o. male seen in the office today to follow up on chronic conditions. Pt with a pmh of diabetes, dm, CKD, a fib, hyperlipidemia.  Not doing much as far as diet, reports he needs to do better Taking medication as prescribed.   Blood sugars ranging from 155-237 fasting  No neuropathy, no increase in urination, no constipation conts to follow up with cardiology and nephrology  Pain in left ear. Feeling of being stopped up. Using a bobby pin to clean it out.   Review of Systems:  Review of Systems  Constitutional: Negative for fever, chills, diaphoresis and activity change.  HENT: Positive for ear pain and hearing loss. Negative for nosebleeds.   Respiratory: Negative for cough and shortness of breath.   Cardiovascular: Negative for chest pain, palpitations and leg swelling.  Gastrointestinal: Negative for nausea, vomiting, abdominal pain, diarrhea and constipation.  Genitourinary: Negative for dysuria and frequency.  Musculoskeletal: Negative for arthralgias and neck pain.  Skin: Negative for wound.  Neurological: Negative for dizziness and light-headedness.  Psychiatric/Behavioral: Negative for sleep disturbance and agitation.    Past Medical History  Diagnosis Date  . Impotence of organic origin   . CKD (chronic kidney disease)     proteinuria  . Type II or unspecified type diabetes mellitus with renal manifestations, uncontrolled   . Peripheral vascular disease, unspecified ([place])   . HTN (hypertension)   . Hemorrhage of rectum and anus   . Prostate cancer ([place])   . Thrombocytopenia, unspecified ([place])   . Tobacco use disorder   . Nephrolithiasis   . Hemorrhage of gastrointestinal tract, unspecified   . HLD  (hyperlipidemia)   . CAD (coronary artery disease)     a.  Lexiscan Myoview (02/14/14):  Apical, inferolateral scar; ? Mild ischemia; EF 32%.;  b.  LHC (02/21/14):  dLM 100%, LAD 100%, CFX 100%, dRCA 50%. => CABG (L-LAD, S-OM, S-PDA)  . Ischemic cardiomyopathy     a. Echo (02/17/14):  EF 35-40%, anteroseptal and apical AK, Gr 2 DD, MAC, mod LAE.  [name] Chronic systolic heart failure ([place])   . Atrial fibrillation ([place])     a. coumadin;  b. Amiodarone   Past Surgical History  Procedure Laterality Date  . Back surgery    . Cataract extraction  2010  . Coronary artery bypass graft N/A 02/24/2014    Procedure: Coronary artery bypass graft times three using left internal mammary artery and left leg greater saphenous vein harvested endoscopically.;  Surgeon: Gaye Pollack, MD;  Location: [facility] OR;  Service: Open Heart Surgery;  Laterality: N/A;  . Intraoperative transesophageal echocardiogram N/A 02/24/2014    Procedure: INTRAOPERATIVE TRANSESOPHAGEAL ECHOCARDIOGRAM;  Surgeon: Gaye Pollack, MD;  Location: [facility] OR;  Service: Open Heart Surgery;  Laterality: N/A;  . Clipping of atrial appendage Left 02/24/2014    Procedure: CLIPPING OF ATRIAL APPENDAGE;  Surgeon: Gaye Pollack, MD;  Location: [place];  Service: Open Heart Surgery;  Laterality: Left;  . Colonoscopy  2005    Normal  . Left heart catheterization with coronary angiogram N/A 02/21/2014    Procedure: LEFT HEART CATHETERIZATION WITH CORONARY ANGIOGRAM;  Surgeon: Peter M Martinique, MD;  Location: [facility] CATH LAB;  Service: Cardiovascular;  Laterality: N/A;   Social History:   reports that he has never smoked. He quit smokeless tobacco use about 21 months ago. His smokeless tobacco use included Chew. He reports that he does not drink alcohol or use illicit drugs.  Family History  Problem Relation Age of Onset  . Cancer Mother 28    leukemia  . Heart disease Father   . Heart attack Father 40    Medications: Patient's Medications  New Prescriptions   No  medications on file  Previous Medications   ASPIRIN EC 81 MG TABLET    Take 1 tablet (81 mg total) by mouth daily.   ATORVASTATIN (LIPITOR) 40 MG TABLET    TAKE 1 TABLET BY MOUTH DAILY.   CALCIUM CARBONATE-VITAMIN D 600-200 MG-UNIT TABS    Take 1 tablet by mouth daily. For bones    CARVEDILOL (COREG) 6.25 MG TABLET    Take 1 tablet (6.25 mg total) by mouth 2 (two) times daily with a meal.   FLAXSEED, LINSEED, (FLAXSEED OIL PO)    Take 1 tablet by mouth daily.    FUROSEMIDE (LASIX) 40 MG TABLET    Take 2 tablets by mouth in the morning and 1 tablet by mouth in the evening   GLIPIZIDE (GLUCOTROL) 5 MG TABLET    Take 2 tabs po in AM and 1 tab in PM   LISINOPRIL (PRINIVIL,ZESTRIL) 20 MG TABLET    Take 20 mg by mouth. 2 tablets by mouth in the am, 2 tablets by mouth in the pm per kidney doctor   METFORMIN (GLUCOPHAGE) 1000 MG TABLET    TAKE 1 TABLET BY MOUTH TWICE A DAY FOR DIABETES   MULTIPLE VITAMIN (MULTIVITAMINS PO)    Take 1 tablet by mouth daily.     POTASSIUM CHLORIDE SA (K-DUR,KLOR-CON) 20 MEQ TABLET    TAKE 1 TABLET EVERY DAY   WARFARIN (COUMADIN) 5 MG TABLET    TAKE 1 TABLET BY MOUTH DAILY OR AS DIRECTED BY COUMADIN CLINIC  Modified Medications   No medications on file  Discontinued Medications   No medications on file     Physical Exam:  Filed Vitals:   12/03/15 0840  BP: 130/80  Pulse: 86  Temp: 98 F (36.7 C)  TempSrc: Oral  Resp: 20  Height: 5\' 10"  (1.778 m)  Weight: 253 lb 12.8 oz (115.123 kg)  SpO2: 97%    Physical Exam  Constitutional: He is oriented to person, place, and time. No distress.  HENT:  Head: Normocephalic and atraumatic.  Left Ear: There is drainage, swelling and tenderness.  Mouth/Throat: Oropharynx is clear and moist. No oropharyngeal exudate.  Neck: Normal range of motion. Neck supple.  Cardiovascular: Normal rate and normal heart sounds.  An irregular rhythm present.  Pulmonary/Chest: Effort normal and breath sounds normal.  Abdominal: Soft.  Bowel sounds are normal.  obese  Musculoskeletal: He exhibits no edema or tenderness.  Neurological: He is alert and oriented to person, place, and time.  Skin: Skin is warm and dry. He is not diaphoretic.  Psychiatric: He has a normal mood and affect.    Labs reviewed: Basic Metabolic Panel:  Recent Labs  04/17/15 0825 07/31/15 0825 12/01/15 0903  NA 144 141 141  K 4.2 4.2 4.2  CL 100 98 100  CO2 25 26 28   GLUCOSE 196* 228* 224*  BUN 19 17 16   CREATININE 1.01 0.90 0.94  CALCIUM 9.5 9.1 8.7   Liver Function  Tests:  Recent Labs  01/19/15 1015 04/17/15 0825 12/01/15 0903  AST 25 16  --   ALT 19 17 22   ALKPHOS 68 62  --   BILITOT 0.6 0.6  --   PROT 7.2 6.7  --   ALBUMIN 4.0 3.9  --    No results for input(s): LIPASE, AMYLASE in the last 8760 hours. No results for input(s): AMMONIA in the last 8760 hours. CBC:  Recent Labs  01/19/15 1015 07/31/15 0825  WBC 7.2 6.5  NEUTROABS 4.4 4.0  HGB 13.4  --   HCT 40.8 40.8  MCV 81  --   PLT 145*  --       Lipid Panel:  Recent Labs  01/19/15 1015 12/01/15 0903  CHOL 125 103  HDL 43 36*  LDLCALC 52 35  TRIG 149 159*  CHOLHDL 2.9 2.9   TSH: No results for input(s): TSH in the last 8760 hours. A1C: Lab Results  Component Value Date   HGBA1C 9.8* 12/01/2015     Assessment/Plan  1. Otitis, externa, infective, left -instructed not to put objects into ear canal - neomycin-polymyxin-hydrocortisone (CORTISPORIN) otic solution; Place 4 drops into the left ear 4 (four) times daily.  Dispense: 10 mL; Refill: 0 for 1 week - to follow up if no improvement after 1 week  2. Type 2 diabetes mellitus with chronic kidney disease, with long-term current use of insulin, unspecified CKD stage (HCC) -A1c worse -pt reports compliance with medication however he is not able to maintain diet changes -dicussed adding insulin and he is willing to do so at this time -will have pt see [facility] Pharm D on Monday for insulin  start and education - Hemoglobin A1c; Future  3. Hyperlipidemia -LDL at 35 and triglycerides elevated most likely due to poor glycemic control -to cont lipitor - Lipid Panel; Future  4. CKD (chronic kidney disease), unspecified stage -ongoing follow up with nephrology, BUN and CR stable on recent labs  5. Hypertension, benign -blood pressure stable, will cont coreg and lisinopril and lasix - Basic Metabolic Panel; Future - CBC with Differential; Future  [place] [phone] 8 am - 5 pm) [phone] (after hours)

## 2015-12-03 NOTE — Patient Instructions (Signed)
Will have you follow up with [facility] Pharm D on Monday for insulin start. Bring blood sugars with you at that time  Start cortisporin ear drops 4 times a day for 1 week due to ear infection      Otitis Externa Otitis externa is a bacterial or fungal infection of the outer ear canal. This is the area from the eardrum to the outside of the ear. Otitis externa is sometimes called "swimmer's ear." CAUSES  Possible causes of infection include:  Swimming in dirty water.  Moisture remaining in the ear after swimming or bathing.  Mild injury (trauma) to the ear.  Objects stuck in the ear (foreign body).  Cuts or scrapes (abrasions) on the outside of the ear. SIGNS AND SYMPTOMS  The first symptom of infection is often itching in the ear canal. Later signs and symptoms may include swelling and redness of the ear canal, ear pain, and yellowish-white fluid (pus) coming from the ear. The ear pain may be worse when pulling on the earlobe. DIAGNOSIS  Your health care provider will perform a physical exam. A sample of fluid may be taken from the ear and examined for bacteria or fungi. TREATMENT  Antibiotic ear drops are often given for 10 to 14 days. Treatment may also include pain medicine or corticosteroids to reduce itching and swelling. HOME CARE INSTRUCTIONS   Apply antibiotic ear drops to the ear canal as prescribed by your health care provider.  Take medicines only as directed by your health care provider.  If you have diabetes, follow any additional treatment instructions from your health care provider.  Keep all follow-up visits as directed by your health care provider. PREVENTION   Keep your ear dry. Use the corner of a towel to absorb water out of the ear canal after swimming or bathing.  Avoid scratching or putting objects inside your ear. This can damage the ear canal or remove the protective wax that lines the canal. This makes it easier for bacteria and fungi to grow.  Avoid  swimming in lakes, polluted water, or poorly chlorinated pools.  You may use ear drops made of rubbing alcohol and vinegar after swimming. Combine equal parts of white vinegar and alcohol in a bottle. Put 3 or 4 drops into each ear after swimming. SEEK MEDICAL CARE IF:   You have a fever.  Your ear is still red, swollen, painful, or draining pus after 3 days.  Your redness, swelling, or pain gets worse.  You have a severe headache.  You have redness, swelling, pain, or tenderness in the area behind your ear. MAKE SURE YOU:   Understand these instructions.  Will watch your condition.  Will get help right away if you are not doing well or get worse.   This information is not intended to replace advice given to you by your health care provider. Make sure you discuss any questions you have with your health care provider.   Document Released: 12/12/2005 Document Revised: 01/02/2015 Document Reviewed: 12/29/2011 Elsevier Interactive Patient Education [employer].

## 2015-12-04 ENCOUNTER — Other Ambulatory Visit: Payer: Self-pay | Admitting: *Deleted

## 2015-12-04 MED ORDER — WARFARIN SODIUM 5 MG PO TABS: ORAL_TABLET | ORAL | Status: DC

## 2015-12-07 ENCOUNTER — Ambulatory Visit (INDEPENDENT_AMBULATORY_CARE_PROVIDER_SITE_OTHER): Payer: Medicare Other | Admitting: Pharmacotherapy

## 2015-12-07 ENCOUNTER — Encounter: Payer: Self-pay | Admitting: Pharmacotherapy

## 2015-12-07 VITALS — BP 132/82 | HR 72 | Temp 97.4°F | Resp 20 | Ht 70.0 in | Wt 253.2 lb

## 2015-12-07 DIAGNOSIS — E1122 Type 2 diabetes mellitus with diabetic chronic kidney disease: Secondary | ICD-10-CM | POA: Diagnosis not present

## 2015-12-07 DIAGNOSIS — Z794 Long term (current) use of insulin: Secondary | ICD-10-CM

## 2015-12-07 DIAGNOSIS — I10 Essential (primary) hypertension: Secondary | ICD-10-CM

## 2015-12-07 DIAGNOSIS — I255 Ischemic cardiomyopathy: Secondary | ICD-10-CM | POA: Diagnosis not present

## 2015-12-07 MED ORDER — INSULIN GLARGINE 300 UNIT/ML ~~LOC~~ SOPN: 10.0000 [IU] | PEN_INJECTOR | Freq: Every day | SUBCUTANEOUS | Status: DC

## 2015-12-07 NOTE — Progress Notes (Signed)
  Subjective:    Seth [name] is a 74 y.o.white male who presents for follow-up of Type 2 diabetes mellitus.   A1C up to 9.8% Needs to start basal insulin.  Average BG:  160-170mg /dl No hypoglycemia.  He struggles with making healthy food choices. No routine exercise. Under a lot of stress.  Frequent trips to [place] to take care of family  Denies problems with vision.  Eye exam is due. Denies problems with feet. No peripheral edema Nocturia at times - at least once.   Review of Systems A comprehensive review of systems was negative except for: Genitourinary: positive for nocturia    Objective:    BP 132/82 mmHg  Pulse 72  Temp(Src) 97.4 F (36.3 C) (Oral)  Resp 20  Ht 5\' 10"  (1.778 m)  Wt 253 lb 3.2 oz (114.851 kg)  BMI 36.33 kg/m2  SpO2 97%  General:  alert, cooperative and no distress  Oropharynx: normal findings: lips normal without lesions and gums healthy   Eyes:  negative findings: lids and lashes normal and conjunctivae and sclerae normal   Ears:  abnormal external canal left ear - erythematous        Lung: clear to auscultation bilaterally  Heart:  regular rate and rhythm     Extremities: extremities normal, atraumatic, no cyanosis or edema  Skin: warm and dry, no hyperpigmentation, vitiligo, or suspicious lesions     Neuro: mental status, speech normal, alert and oriented x3, PERLA and reflexes normal and symmetric   Lab Review GLUCOSE (mg/dL)  Date Value  12/01/2015 224*  07/31/2015 228*  04/17/2015 196*   GLUCOSE, BLD (mg/dL)  Date Value  09/23/2014 159*  09/11/2014 258*  03/24/2014 133*   CO2 (mmol/L)  Date Value  12/01/2015 28  07/31/2015 26  04/17/2015 25   BUN (mg/dL)  Date Value  12/01/2015 16  07/31/2015 17  04/17/2015 19  09/23/2014 15  09/11/2014 24*  03/24/2014 18   CREATININE, SER (mg/dL)  Date Value  12/01/2015 0.94  07/31/2015 0.90  04/17/2015 1.01       Assessment:    Diabetes Mellitus type II, under  poor control.   BP at goal <140/90   Plan:    1.  Rx changes: start Toujeo 10units once daily.  Counseled on risk / benefit of insulin use.  Taught how to self inject.  First dose given in office. 2.  Continue Metformin and glipizide for now.  May be able to stop glipizide - has been on a long time and doubt it is adding any benefit. 3.  Counseled on progression of diabetes and need for exogenous insulin dose. 4.  Counseled on nutrition goals. 5.  Counseled on need for routine exercise.  Goal is 30-45 minutes 5 x week. 6.  BP at goal <140/90 on current RX.

## 2015-12-07 NOTE — Patient Instructions (Signed)
Start Toujeo 10 units once daily

## 2015-12-30 ENCOUNTER — Other Ambulatory Visit: Payer: Self-pay | Admitting: *Deleted

## 2015-12-30 MED ORDER — METFORMIN HCL 1000 MG PO TABS: ORAL_TABLET | ORAL | Status: DC

## 2015-12-30 NOTE — Telephone Encounter (Signed)
[facility]

## 2016-01-04 DIAGNOSIS — I10 Essential (primary) hypertension: Secondary | ICD-10-CM | POA: Diagnosis not present

## 2016-01-04 DIAGNOSIS — R809 Proteinuria, unspecified: Secondary | ICD-10-CM | POA: Diagnosis not present

## 2016-01-05 ENCOUNTER — Ambulatory Visit (INDEPENDENT_AMBULATORY_CARE_PROVIDER_SITE_OTHER): Payer: Medicare Other | Admitting: *Deleted

## 2016-01-05 DIAGNOSIS — Z5181 Encounter for therapeutic drug level monitoring: Secondary | ICD-10-CM | POA: Diagnosis not present

## 2016-01-05 DIAGNOSIS — I480 Paroxysmal atrial fibrillation: Secondary | ICD-10-CM | POA: Diagnosis not present

## 2016-01-05 DIAGNOSIS — I4891 Unspecified atrial fibrillation: Secondary | ICD-10-CM

## 2016-01-05 LAB — POCT INR: INR: 1.6

## 2016-01-05 NOTE — Addendum Note (Signed)
Addended by: Margretta Sidle on: 01/05/2016 10:52 AM   Modules accepted: Orders, Medications

## 2016-01-18 ENCOUNTER — Encounter: Payer: Self-pay | Admitting: Pharmacotherapy

## 2016-01-18 ENCOUNTER — Ambulatory Visit (INDEPENDENT_AMBULATORY_CARE_PROVIDER_SITE_OTHER): Payer: Medicare Other | Admitting: Pharmacotherapy

## 2016-01-18 VITALS — BP 142/88 | HR 73 | Temp 98.2°F | Ht 70.0 in | Wt 258.8 lb

## 2016-01-18 DIAGNOSIS — Z794 Long term (current) use of insulin: Secondary | ICD-10-CM

## 2016-01-18 DIAGNOSIS — E1122 Type 2 diabetes mellitus with diabetic chronic kidney disease: Secondary | ICD-10-CM | POA: Diagnosis not present

## 2016-01-18 DIAGNOSIS — I10 Essential (primary) hypertension: Secondary | ICD-10-CM

## 2016-01-18 MED ORDER — INSULIN GLARGINE 300 UNIT/ML ~~LOC~~ SOPN: 16.0000 [IU] | PEN_INJECTOR | Freq: Every day | SUBCUTANEOUS | Status: DC

## 2016-01-18 NOTE — Patient Instructions (Signed)
Increase Toujeo 16 units daily  Start using Eucerin Creme to legs.

## 2016-01-18 NOTE — Progress Notes (Signed)
  Subjective:    Seth [name] is a 75 y.o.white male who presents for follow-up of Type 2 diabetes mellitus.   Started on basal insulin 6 weeks ago due to A1C up to 9.8%  Average BG: 170mg /dl No hypoglycemia  Continues to eat out most meals. No routine exercise. His right foot is painful in the heel. Some peripheral edema. Just had eye exam last week.  No problems noted. Nocturia once per night.  Toujeo 10 units, Metformin, and glipizide.  Review of Systems A comprehensive review of systems was negative except for: Genitourinary: positive for nocturia Integument/breast: positive for dry skin    Objective:    BP 142/88 mmHg  Pulse 73  Temp(Src) 98.2 F (36.8 C) (Oral)  Ht 5\' 10"  (1.778 m)  Wt 258 lb 12.8 oz (117.391 kg)  BMI 37.13 kg/m2  SpO2 96%  General:  alert, cooperative and no distress  Oropharynx: normal findings: lips normal without lesions and gums healthy   Eyes:  negative findings: lids and lashes normal and conjunctivae and sclerae normal   Ears:  external ears normal        Lung: clear to auscultation bilaterally  Heart:  regular rate and rhythm     Extremities: extremities normal, atraumatic, no cyanosis or edema  Skin: dry     Neuro: mental status, speech normal, alert and oriented x3 and gait and station normal   Lab Review GLUCOSE (mg/dL)  Date Value  12/01/2015 224*  07/31/2015 228*  04/17/2015 196*   GLUCOSE, BLD (mg/dL)  Date Value  09/23/2014 159*  09/11/2014 258*  03/24/2014 133*   CO2 (mmol/L)  Date Value  12/01/2015 28  07/31/2015 26  04/17/2015 25   BUN (mg/dL)  Date Value  12/01/2015 16  07/31/2015 17  04/17/2015 19  09/23/2014 15  09/11/2014 24*  03/24/2014 18   CREATININE, SER (mg/dL)  Date Value  12/01/2015 0.94  07/31/2015 0.90  04/17/2015 1.01       Assessment:    Diabetes Mellitus type II, under fair control. Average BG is still too high, but improving. BP above target <140/90   Plan:    1.  Rx  changes: Increase Toujeo 16 units daily. 2.  Continue metformin and glipizide. 3.  Counseled on nutrition goals. 4.  Counseled on need for routine exercise.  Goal is 30-45 minutes 5 x week. 5.  Counseled on skin care.  Try Eucerin Creme to lower legs.  Advised against chronic use of steroid cream. 6.  BP above target <140/90 today.  Will continue current RX and monitor. 7.  RTC in 6 weeks.

## 2016-01-19 ENCOUNTER — Ambulatory Visit (INDEPENDENT_AMBULATORY_CARE_PROVIDER_SITE_OTHER): Payer: Medicare Other | Admitting: *Deleted

## 2016-01-19 DIAGNOSIS — I480 Paroxysmal atrial fibrillation: Secondary | ICD-10-CM | POA: Diagnosis not present

## 2016-01-19 DIAGNOSIS — Z5181 Encounter for therapeutic drug level monitoring: Secondary | ICD-10-CM | POA: Diagnosis not present

## 2016-01-19 DIAGNOSIS — I4891 Unspecified atrial fibrillation: Secondary | ICD-10-CM | POA: Diagnosis not present

## 2016-01-19 LAB — POCT INR: INR: 2

## 2016-02-10 NOTE — Progress Notes (Signed)
This encounter was created in error - please disregard.

## 2016-02-11 ENCOUNTER — Encounter: Payer: Medicare Other | Admitting: Cardiology

## 2016-02-11 ENCOUNTER — Ambulatory Visit (INDEPENDENT_AMBULATORY_CARE_PROVIDER_SITE_OTHER): Payer: Medicare Other | Admitting: *Deleted

## 2016-02-11 DIAGNOSIS — Z5181 Encounter for therapeutic drug level monitoring: Secondary | ICD-10-CM | POA: Diagnosis not present

## 2016-02-11 DIAGNOSIS — I4891 Unspecified atrial fibrillation: Secondary | ICD-10-CM | POA: Diagnosis not present

## 2016-02-11 DIAGNOSIS — I480 Paroxysmal atrial fibrillation: Secondary | ICD-10-CM

## 2016-02-11 LAB — POCT INR: INR: 2.3

## 2016-02-19 ENCOUNTER — Ambulatory Visit (INDEPENDENT_AMBULATORY_CARE_PROVIDER_SITE_OTHER): Payer: Medicare Other | Admitting: Cardiology

## 2016-02-19 ENCOUNTER — Encounter: Payer: Self-pay | Admitting: Cardiology

## 2016-02-19 VITALS — BP 138/72 | HR 64 | Ht 70.0 in | Wt 259.0 lb

## 2016-02-19 DIAGNOSIS — I2510 Atherosclerotic heart disease of native coronary artery without angina pectoris: Secondary | ICD-10-CM

## 2016-02-19 NOTE — Progress Notes (Signed)
02/19/2016 Seth [name]   05/08/1941  [id number]  Primary Physician Gildardo Cranker, DO Primary Cardiologist: [name]   Reason for Visit/CC: F/u for CAD, Chronic Systolic CHF and PAF  HPI:  75 y/o male, followed by [name], who presents to clinic for routine 6 month f/u. He has a h/o CAD, chronic systolic CHF, PAF, DM, HTN, HLD, prostate CA, renal insufficiency and obesity.   He was seen as a new patient 02/12/14 for evaluation of atrial fib. He was placed on Xarelto. He was set up for echo and stress test. Stress test was abnormal with apical and inferolateral scar and ischemia, LVEF of 32%. Echo 02/17/14 with LVEF=35-40% with akinesis of the mid-distalanteroseptal and apical myocardium, MAC, dilated LA. Cardiac cath 02/21/14 with occluded left main, moderate RCA stenosis with the entire left system filling from right to left collaterals. 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 He was discharged on amiodarone and coumadin. Echo 05/28/14 with LVEF=35-40%, concentric LVH, mild MR. He is now on Coumadin for a/c. Xarelto was discontinued due to cost. His INRs are followed in our coumadin clinic. His last OV with [name] was 6 months ago and he was noted to be stable from a cardiac standpoint.  Today in clinic, he reports that he has done well. He denies any chest pain or dyspnea. No palpitations. Also no LEE, orthopnea, PND, weight gain, dizziness, syncope/ near syncope. He has been fully compliant with his meds. No abnormal bleeding or falls on Coumadin. Nephrology follows his renal function. Recent BMP 11/2015 showed normal SCr and K levels, on Lasix. Scr was 0.94. His PCP follows his DM. This is poorly controlled. Recent Hgb A1c was 9.3. He was recently started on insulin. He continues to take Metformin.    Current Outpatient Prescriptions  Medication Sig Dispense Refill  . aspirin EC 81 MG tablet Take 1 tablet (81 mg total) by mouth daily.    [name]  atorvastatin (LIPITOR) 40 MG tablet TAKE 1 TABLET BY MOUTH DAILY. 90 tablet 3  . Calcium Carbonate-Vitamin D 600-200 MG-UNIT TABS Take 1 tablet by mouth daily. For bones     . carvedilol (COREG) 12.5 MG tablet Take 12.5 mg by mouth 2 (two) times daily with a meal.    . Flaxseed, Linseed, (FLAXSEED OIL PO) Take 1 tablet by mouth daily.     . furosemide (LASIX) 40 MG tablet Take 2 tablets by mouth in the morning and 1 tablet by mouth in the evening 90 tablet 5  . glipiZIDE (GLUCOTROL) 5 MG tablet Take 2 tabs po in AM and 1 tab in PM 270 tablet 1  . Insulin Glargine (TOUJEO SOLOSTAR) 300 UNIT/ML SOPN Inject 16 Units into the skin daily. 4.5 mL 4  . lisinopril (PRINIVIL,ZESTRIL) 20 MG tablet Take 20 mg by mouth. 2 tablets by mouth in the am, 2 tablets by mouth in the pm per kidney doctor 30 tablet 0  . metFORMIN (GLUCOPHAGE) 1000 MG tablet Take one tablet by mouth twice daily for diabetes 60 tablet 5  . Multiple Vitamin (MULTIVITAMINS PO) Take 1 tablet by mouth daily.      . potassium chloride SA (K-DUR,KLOR-CON) 20 MEQ tablet TAKE 1 TABLET EVERY DAY 90 tablet 0  . warfarin (COUMADIN) 5 MG tablet TAKE 1 TABLET BY MOUTH DAILY OR AS DIRECTED BY COUMADIN CLINIC 45 tablet 3   No current facility-administered medications for this visit.    No Known Allergies  Social History   Social History  . Marital Status: Married    Spouse Name: N/A  . Number of Children: 3  . Years of Education: N/A   Occupational History  . Sell storage buildings    Social History Main Topics  . Smoking status: Never Smoker   . Smokeless tobacco: Former Systems developer    Types: Chew    Quit date: 02/23/2014  . Alcohol Use: No  . Drug Use: No  . Sexual Activity: Not on file   Other Topics Concern  . Not on file   Social History Narrative     Review of Systems: General: negative for chills, fever, night sweats or weight changes.  Cardiovascular: negative for chest pain, dyspnea on exertion, edema, orthopnea,  palpitations, paroxysmal nocturnal dyspnea or shortness of breath Dermatological: negative for rash Respiratory: negative for cough or wheezing Urologic: negative for hematuria Abdominal: negative for nausea, vomiting, diarrhea, bright red blood per rectum, melena, or hematemesis Neurologic: negative for visual changes, syncope, or dizziness All other systems reviewed and are otherwise negative except as noted above.    Blood pressure 138/72, pulse 64, height 5\' 10"  (1.778 m), weight 259 lb (117.482 kg).  General appearance: alert, cooperative and no distress Neck: no carotid bruit and no JVD Lungs: clear to auscultation bilaterally Heart: regular rate and rhythm, S1, S2 normal, no murmur, click, rub or gallop Extremities: no LEE Pulses: 2+ and symmetric Skin: warm and dry Neurologic: Grossly normal  EKG NSR. 64 bpm. LVH no ischemia.   ASSESSMENT AND PLAN:   1. CAD: s/p CABG in 2015. Stable w/o chest pain. No exertional symptoms Continue current meds.   2. Atrial fibrillation: NSR on EKG today. Continue Coreg for rate control and coumadin for a/c. INRs followed in our clinic and have been stable. No abnormal bleeding or falls.    3. Ischemic cardiomyopathy: LVEF=35-40% by echo June 2015. Will continue medical management   4. Chronic systolic CHF: Volume status is stable. Continue Lasix 80 mg am, 40 mg pm. We discussed importance of daily weights and low sodium diet.   5. HTN: BP controlled at home. No changes  6. HLD: Lipids controlled. Continue statin.    PLAN  F/u with [name] in 6 months.   Lyda Jester PA-C 02/19/2016 9:54 AM

## 2016-02-19 NOTE — Patient Instructions (Signed)
Medication Instructions:  Your physician recommends that you continue on your current medications as directed. Please refer to the Current Medication list given to you today.   Labwork: NONE  Testing/Procedures: NONE  Follow-Up: Your physician wants you to follow-up in: 6 MONTHS WITH [name] You will receive a reminder letter in the mail two months in advance. If you don't receive a letter, please call our office to schedule the follow-up appointment.   Any Other Special Instructions Will Be Listed Below (If Applicable). 1. WEIGH DAILY AND CALL BRITTANY SIMMONS, PAC IF YOUR WEIGHT IS INCREASED BY 2-3 LB'S IN 1 DAY OR 5 LB'S IN 1 WEEK [phone]  2. LIMIT SALT INTAKE; SEE BELOW FOR A LOW SODIUM DIET PLAN TO FOLLOW   If you need a refill on your cardiac medications before your next appointment, please call your pharmacy.  Low-Sodium Eating Plan Sodium raises blood pressure and causes water to be held in the body. Getting less sodium from food will help lower your blood pressure, reduce any swelling, and protect your heart, liver, and kidneys. We get sodium by adding salt (sodium chloride) to food. Most of our sodium comes from canned, boxed, and frozen foods. Restaurant foods, fast foods, and pizza are also very high in sodium. Even if you take medicine to lower your blood pressure or to reduce fluid in your body, getting less sodium from your food is important. WHAT IS MY PLAN? Most people should limit their sodium intake to 2,300 mg a day. Your health care provider recommends that you limit your sodium intake to __________ a day.  WHAT DO I NEED TO KNOW ABOUT THIS EATING PLAN? For the low-sodium eating plan, you will follow these general guidelines:  Choose foods with a % Daily Value for sodium of less than 5% (as listed on the food label).   Use salt-free seasonings or herbs instead of table salt or sea salt.   Check with your health care provider or pharmacist before using salt  substitutes.   Eat fresh foods.  Eat more vegetables and fruits.  Limit canned vegetables. If you do use them, rinse them well to decrease the sodium.   Limit cheese to 1 oz (28 g) per day.   Eat lower-sodium products, often labeled as "lower sodium" or "no salt added."  Avoid foods that contain monosodium glutamate (MSG). MSG is sometimes added to [place] food and some canned foods.  Check food labels (Nutrition Facts labels) on foods to learn how much sodium is in one serving.  Eat more home-cooked food and less restaurant, buffet, and fast food.  When eating at a restaurant, ask that your food be prepared with less salt, or no salt if possible.  HOW DO I READ FOOD LABELS FOR SODIUM INFORMATION? The Nutrition Facts label lists the amount of sodium in one serving of the food. If you eat more than one serving, you must multiply the listed amount of sodium by the number of servings. Food labels may also identify foods as:  Sodium free--Less than 5 mg in a serving.  Very low sodium--35 mg or less in a serving.  Low sodium--140 mg or less in a serving.  Light in sodium--50% less sodium in a serving. For example, if a food that usually has 300 mg of sodium is changed to become light in sodium, it will have 150 mg of sodium.  Reduced sodium--25% less sodium in a serving. For example, if a food that usually has 400 mg of  sodium is changed to reduced sodium, it will have 300 mg of sodium. WHAT FOODS CAN I EAT? Grains Low-sodium cereals, including oats, puffed wheat and rice, and shredded wheat cereals. Low-sodium crackers. Unsalted rice and pasta. Lower-sodium bread.  Vegetables Frozen or fresh vegetables. Low-sodium or reduced-sodium canned vegetables. Low-sodium or reduced-sodium tomato sauce and paste. Low-sodium or reduced-sodium tomato and vegetable juices.  Fruits Fresh, frozen, and canned fruit. Fruit juice.  Meat and Other Protein Products Low-sodium canned tuna  and salmon. Fresh or frozen meat, poultry, seafood, and fish. Lamb. Unsalted nuts. Dried beans, peas, and lentils without added salt. Unsalted canned beans. Homemade soups without salt. Eggs.  Dairy Milk. Soy milk. Ricotta cheese. Low-sodium or reduced-sodium cheeses. Yogurt.  Condiments Fresh and dried herbs and spices. Salt-free seasonings. Onion and garlic powders. Low-sodium varieties of mustard and ketchup. Fresh or refrigerated horseradish. Lemon juice.  Fats and Oils Reduced-sodium salad dressings. Unsalted butter.  Other Unsalted popcorn and pretzels.  The items listed above may not be a complete list of recommended foods or beverages. Contact your dietitian for more options. WHAT FOODS ARE NOT RECOMMENDED? Grains Instant hot cereals. Bread stuffing, pancake, and biscuit mixes. Croutons. Seasoned rice or pasta mixes. Noodle soup cups. Boxed or frozen macaroni and cheese. Self-rising flour. Regular salted crackers. Vegetables Regular canned vegetables. Regular canned tomato sauce and paste. Regular tomato and vegetable juices. Frozen vegetables in sauces. Salted [place] fries. Olives. Angie Fava. Relishes. Sauerkraut. Salsa. Meat and Other Protein Products Salted, canned, smoked, spiced, or pickled meats, seafood, or fish. Bacon, ham, sausage, hot dogs, corned beef, chipped beef, and packaged luncheon meats. Salt pork. Jerky. Pickled herring. Anchovies, regular canned tuna, and sardines. Salted nuts. Dairy Processed cheese and cheese spreads. Cheese curds. Blue cheese and cottage cheese. Buttermilk.  Condiments Onion and garlic salt, seasoned salt, table salt, and sea salt. Canned and packaged gravies. Worcestershire sauce. Tartar sauce. Barbecue sauce. Teriyaki sauce. Soy sauce, including reduced sodium. Steak sauce. Fish sauce. Oyster sauce. Cocktail sauce. Horseradish that you find on the shelf. Regular ketchup and mustard. Meat flavorings and tenderizers. Bouillon cubes. Hot  sauce. Tabasco sauce. Marinades. Taco seasonings. Relishes. Fats and Oils Regular salad dressings. Salted butter. Margarine. Ghee. Bacon fat.  Other Potato and tortilla chips. Corn chips and puffs. Salted popcorn and pretzels. Canned or dried soups. Pizza. Frozen entrees and pot pies.  The items listed above may not be a complete list of foods and beverages to avoid. Contact your dietitian for more information.   This information is not intended to replace advice given to you by your health care provider. Make sure you discuss any questions you have with your health care provider.   Document Released: 06/03/2002 Document Revised: 01/02/2015 Document Reviewed: 10/16/2013 Elsevier Interactive Patient Education [employer].

## 2016-03-02 ENCOUNTER — Other Ambulatory Visit: Payer: Medicare Other

## 2016-03-02 ENCOUNTER — Encounter: Payer: Self-pay | Admitting: Internal Medicine

## 2016-03-02 DIAGNOSIS — Z794 Long term (current) use of insulin: Secondary | ICD-10-CM | POA: Diagnosis not present

## 2016-03-02 DIAGNOSIS — E1122 Type 2 diabetes mellitus with diabetic chronic kidney disease: Secondary | ICD-10-CM | POA: Diagnosis not present

## 2016-03-02 DIAGNOSIS — I10 Essential (primary) hypertension: Secondary | ICD-10-CM | POA: Diagnosis not present

## 2016-03-02 DIAGNOSIS — E785 Hyperlipidemia, unspecified: Secondary | ICD-10-CM

## 2016-03-03 LAB — COMPREHENSIVE METABOLIC PANEL
ALT: 20 IU/L (ref 0–44)
AST: 16 IU/L (ref 0–40)
Albumin/Globulin Ratio: 1.5 (ref 1.1–2.5)
Albumin: 3.8 g/dL (ref 3.5–4.8)
Alkaline Phosphatase: 61 IU/L (ref 39–117)
BUN/Creatinine Ratio: 20 (ref 10–22)
BUN: 18 mg/dL (ref 8–27)
Bilirubin Total: 0.3 mg/dL (ref 0.0–1.2)
CO2: 22 mmol/L (ref 18–29)
Calcium: 8.9 mg/dL (ref 8.6–10.2)
Chloride: 102 mmol/L (ref 96–106)
Creatinine, Ser: 0.89 mg/dL (ref 0.76–1.27)
GFR calc Af Amer: 98 mL/min/{1.73_m2} (ref >59)
GFR calc non Af Amer: 85 mL/min/{1.73_m2} (ref >59)
Globulin, Total: 2.6 g/dL (ref 1.5–4.5)
Glucose: 230 mg/dL — ABNORMAL HIGH (ref 65–99)
Potassium: 3.9 mmol/L (ref 3.5–5.2)
Sodium: 144 mmol/L (ref 134–144)
Total Protein: 6.4 g/dL (ref 6.0–8.5)

## 2016-03-03 LAB — LIPID PANEL
Chol/HDL Ratio: 2.8 ratio units (ref 0.0–5.0)
Cholesterol, Total: 97 mg/dL — ABNORMAL LOW (ref 100–199)
HDL: 35 mg/dL — ABNORMAL LOW (ref >39)
LDL Calculated: 35 mg/dL (ref 0–99)
Triglycerides: 134 mg/dL (ref 0–149)
VLDL Cholesterol Cal: 27 mg/dL (ref 5–40)

## 2016-03-03 LAB — CBC WITH DIFFERENTIAL/PLATELET
Basophils Absolute: 0 10*3/uL (ref 0.0–0.2)
Basos: 0 %
EOS (ABSOLUTE): 0.2 10*3/uL (ref 0.0–0.4)
Eos: 2 %
Hematocrit: 41.5 % (ref 37.5–51.0)
Hemoglobin: 13.8 g/dL (ref 12.6–17.7)
Immature Grans (Abs): 0 10*3/uL (ref 0.0–0.1)
Immature Granulocytes: 0 %
Lymphocytes Absolute: 1.6 10*3/uL (ref 0.7–3.1)
Lymphs: 24 %
MCH: 28.5 pg (ref 26.6–33.0)
MCHC: 33.3 g/dL (ref 31.5–35.7)
MCV: 86 fL (ref 79–97)
Monocytes Absolute: 0.9 10*3/uL (ref 0.1–0.9)
Monocytes: 13 %
Neutrophils Absolute: 3.9 10*3/uL (ref 1.4–7.0)
Neutrophils: 61 %
Platelets: 142 10*3/uL — ABNORMAL LOW (ref 150–379)
RBC: 4.85 x10E6/uL (ref 4.14–5.80)
RDW: 14.3 % (ref 12.3–15.4)
WBC: 6.6 10*3/uL (ref 3.4–10.8)

## 2016-03-03 LAB — HEMOGLOBIN A1C
Est. average glucose Bld gHb Est-mCnc: 223 mg/dL
Hgb A1c MFr Bld: 9.4 % — ABNORMAL HIGH (ref 4.8–5.6)

## 2016-03-04 ENCOUNTER — Ambulatory Visit: Payer: Medicare Other | Admitting: Internal Medicine

## 2016-03-07 ENCOUNTER — Other Ambulatory Visit: Payer: Self-pay | Admitting: *Deleted

## 2016-03-07 MED ORDER — POTASSIUM CHLORIDE CRYS ER 20 MEQ PO TBCR: 20.0000 meq | EXTENDED_RELEASE_TABLET | Freq: Every day | ORAL | Status: DC

## 2016-03-07 NOTE — Telephone Encounter (Signed)
[facility]  

## 2016-03-09 ENCOUNTER — Encounter: Payer: Self-pay | Admitting: Internal Medicine

## 2016-03-09 ENCOUNTER — Ambulatory Visit (INDEPENDENT_AMBULATORY_CARE_PROVIDER_SITE_OTHER): Payer: Medicare Other | Admitting: Internal Medicine

## 2016-03-09 VITALS — BP 138/78 | HR 89 | Temp 97.9°F | Resp 20 | Ht 70.0 in | Wt 261.4 lb

## 2016-03-09 DIAGNOSIS — Z8546 Personal history of malignant neoplasm of prostate: Secondary | ICD-10-CM

## 2016-03-09 DIAGNOSIS — I4891 Unspecified atrial fibrillation: Secondary | ICD-10-CM

## 2016-03-09 DIAGNOSIS — I739 Peripheral vascular disease, unspecified: Secondary | ICD-10-CM | POA: Diagnosis not present

## 2016-03-09 DIAGNOSIS — I5022 Chronic systolic (congestive) heart failure: Secondary | ICD-10-CM

## 2016-03-09 DIAGNOSIS — E785 Hyperlipidemia, unspecified: Secondary | ICD-10-CM | POA: Diagnosis not present

## 2016-03-09 DIAGNOSIS — E1143 Type 2 diabetes mellitus with diabetic autonomic (poly)neuropathy: Secondary | ICD-10-CM

## 2016-03-09 DIAGNOSIS — I10 Essential (primary) hypertension: Secondary | ICD-10-CM

## 2016-03-09 DIAGNOSIS — I2510 Atherosclerotic heart disease of native coronary artery without angina pectoris: Secondary | ICD-10-CM | POA: Diagnosis not present

## 2016-03-09 DIAGNOSIS — Z794 Long term (current) use of insulin: Secondary | ICD-10-CM | POA: Diagnosis not present

## 2016-03-09 NOTE — Patient Instructions (Signed)
Increase Toujeo to 20 units daily  Continue other medications as ordered  Follow up with specialists as scheduled  Follow up in 3 mos for routine visit. Fasting labs prior to appt

## 2016-03-09 NOTE — Progress Notes (Signed)
Patient ID: Seth [name], male   DOB: [birth year], 75 y.o.   MRN: [id number]    Location:    [facility]   Place of Service:  OFFICE   Chief Complaint  Patient presents with  . Medical Management of Chronic Issues    3 mo f/u    HPI:  75 yo male seen today for f/u. BS elevated at home 160-190s and occasionally as high as 254. No low BS reactions. He just got a new meter about 1 month ago. He reports no numbness or tingling. He is on toujeo 16 units daily, metformin, glipizide. A1c 9.4%  CAD/ICM/HTN/USA/HF - stable on ASA, coumadin, lipitor, coreg, lasix with KCL, lisinopril. No CP or SOB. Followed by cardio  PAD - stable on ASA and coumadin  afib - rate controlled on coreg. On coumadin with last INR 2.3. Followed by cardio  CKD - stable with nml Cr 0.89  Hx prostate CA - stable. Followed by urology  Hyperlipidemia - stable on lipitor. LDL 35. No myalgias  Anemia - stable. Hgb 13.8  Past Medical History  Diagnosis Date  . Impotence of organic origin   . CKD (chronic kidney disease)     proteinuria  . Type II or unspecified type diabetes mellitus with renal manifestations, uncontrolled   . Peripheral vascular disease, unspecified ([place])   . HTN (hypertension)   . Hemorrhage of rectum and anus   . Prostate cancer ([place])   . Thrombocytopenia, unspecified ([place])   . Tobacco use disorder   . Nephrolithiasis   . Hemorrhage of gastrointestinal tract, unspecified   . HLD (hyperlipidemia)   . CAD (coronary artery disease)     a.  Lexiscan Myoview (02/14/14):  Apical, inferolateral scar; ? Mild ischemia; EF 32%.;  b.  LHC (02/21/14):  dLM 100%, LAD 100%, CFX 100%, dRCA 50%. => CABG (L-LAD, S-OM, S-PDA)  . Ischemic cardiomyopathy     a. Echo (02/17/14):  EF 35-40%, anteroseptal and apical AK, Gr 2 DD, MAC, mod LAE.  [name] Chronic systolic heart failure ([place])   . Atrial fibrillation ([place])     a. coumadin;  b. Amiodarone    Past Surgical History  Procedure Laterality Date  . Back surgery      . Cataract extraction  2010  . Coronary artery bypass graft N/A 02/24/2014    Procedure: Coronary artery bypass graft times three using left internal mammary artery and left leg greater saphenous vein harvested endoscopically.;  Surgeon: Gaye Pollack, MD;  Location: [facility] OR;  Service: Open Heart Surgery;  Laterality: N/A;  . Intraoperative transesophageal echocardiogram N/A 02/24/2014    Procedure: INTRAOPERATIVE TRANSESOPHAGEAL ECHOCARDIOGRAM;  Surgeon: Gaye Pollack, MD;  Location: [facility] OR;  Service: Open Heart Surgery;  Laterality: N/A;  . Clipping of atrial appendage Left 02/24/2014    Procedure: CLIPPING OF ATRIAL APPENDAGE;  Surgeon: Gaye Pollack, MD;  Location: [facility];  Service: Open Heart Surgery;  Laterality: Left;  . Colonoscopy  2005    Normal  . Left heart catheterization with coronary angiogram N/A 02/21/2014    Procedure: LEFT HEART CATHETERIZATION WITH CORONARY ANGIOGRAM;  Surgeon: Peter M Martinique, MD;  Location: [facility] CATH LAB;  Service: Cardiovascular;  Laterality: N/A;    Patient Care Team: Gildardo Cranker, DO as PCP - General (Internal Medicine) [name], MD as Attending Physician (Urology) [name], MD as Consulting Physician (Nephrology)  Social History   Social History  . Marital Status: Married    Spouse Name: N/A  .  Number of Children: 3  . Years of Education: N/A   Occupational History  . Sell storage buildings    Social History Main Topics  . Smoking status: Never Smoker   . Smokeless tobacco: Former Systems developer    Types: Chew    Quit date: 02/23/2014  . Alcohol Use: No  . Drug Use: No  . Sexual Activity: Not on file   Other Topics Concern  . Not on file   Social History Narrative     reports that he has never smoked. He quit smokeless tobacco use about 2 years ago. His smokeless tobacco use included Chew. He reports that he does not drink alcohol or use illicit drugs.  No Known Allergies  Medications: Patient's Medications  New Prescriptions   No  medications on file  Previous Medications   ASPIRIN EC 81 MG TABLET    Take 1 tablet (81 mg total) by mouth daily.   ATORVASTATIN (LIPITOR) 40 MG TABLET    TAKE 1 TABLET BY MOUTH DAILY.   CALCIUM CARBONATE-VITAMIN D 600-200 MG-UNIT TABS    Take 1 tablet by mouth daily. For bones    CARVEDILOL (COREG) 12.5 MG TABLET    Take 25 mg by mouth 2 (two) times daily with a meal.    FLAXSEED, LINSEED, (FLAXSEED OIL PO)    Take 1 tablet by mouth daily.    FUROSEMIDE (LASIX) 40 MG TABLET    Take 2 tablets by mouth in the morning and 1 tablet by mouth in the evening   GLIPIZIDE (GLUCOTROL) 5 MG TABLET    Take 2 tabs po in AM and 1 tab in PM   INSULIN GLARGINE (TOUJEO SOLOSTAR) 300 UNIT/ML SOPN    Inject 16 Units into the skin daily.   LISINOPRIL (PRINIVIL,ZESTRIL) 20 MG TABLET    Take 20 mg by mouth. 2 tablets by mouth in the am, 2 tablets by mouth in the pm per kidney doctor   METFORMIN (GLUCOPHAGE) 1000 MG TABLET    Take one tablet by mouth twice daily for diabetes   MULTIPLE VITAMIN (MULTIVITAMINS PO)    Take 1 tablet by mouth daily.     POTASSIUM CHLORIDE SA (K-DUR,KLOR-CON) 20 MEQ TABLET    Take 1 tablet (20 mEq total) by mouth daily.   WARFARIN (COUMADIN) 5 MG TABLET    TAKE 1 TABLET BY MOUTH DAILY OR AS DIRECTED BY COUMADIN CLINIC  Modified Medications   No medications on file  Discontinued Medications   No medications on file    Review of Systems  Constitutional: Negative for chills, activity change and fatigue.  HENT: Negative for sore throat and trouble swallowing.   Eyes: Negative for visual disturbance.  Respiratory: Negative for cough, chest tightness and shortness of breath.   Cardiovascular: Negative for chest pain, palpitations and leg swelling.  Gastrointestinal: Negative for nausea, vomiting, abdominal pain and blood in stool.  Genitourinary: Negative for urgency, frequency and difficulty urinating.  Musculoskeletal: Negative for arthralgias and gait problem.  Skin: Negative for  rash.  Neurological: Negative for weakness, numbness and headaches.  Psychiatric/Behavioral: Negative for confusion and sleep disturbance. The patient is not nervous/anxious.     Filed Vitals:   03/09/16 0846  BP: 138/78  Pulse: 89  Temp: 97.9 F (36.6 C)  TempSrc: Oral  Resp: 20  Height: 5\' 10"  (1.778 m)  Weight: 261 lb 6.4 oz (118.57 kg)  SpO2: 93%   Body mass index is 37.51 kg/(m^2).  Physical Exam  Constitutional: He is oriented  to person, place, and time. He appears well-developed and well-nourished.  HENT:  Mouth/Throat: Oropharynx is clear and moist.  Eyes: Pupils are equal, round, and reactive to light. No scleral icterus.  Neck: Neck supple. Carotid bruit is not present.  Cardiovascular: Regular rhythm and intact distal pulses.  Bradycardia present.  Exam reveals no gallop and no friction rub.   Murmur (1/6 SEM) heard. Trace b/l LE swelling. No calf TTP  Pulmonary/Chest: Effort normal and breath sounds normal. He has no wheezes. He has no rales. He exhibits no tenderness.  Abdominal: Soft. Bowel sounds are normal. He exhibits no distension, no abdominal bruit, no pulsatile midline mass and no mass. There is no tenderness. There is no rebound and no guarding.  Musculoskeletal: He exhibits edema.  Lymphadenopathy:    He has no cervical adenopathy.  Neurological: He is alert and oriented to person, place, and time.  Skin: Skin is warm and dry. Rash (b/l elbow eczematous rash. no vesicular formation) noted.  Psychiatric: He has a normal mood and affect. His behavior is normal. Judgment and thought content normal.     Labs reviewed: Appointment on 03/02/2016  Component Date Value Ref Range Status  . WBC 03/02/2016 6.6  3.4 - 10.8 x10E3/uL Final  . RBC 03/02/2016 4.85  4.14 - 5.80 x10E6/uL Final  . Hemoglobin 03/02/2016 13.8  12.6 - 17.7 g/dL Final  . Hematocrit 03/02/2016 41.5  37.5 - 51.0 % Final  . MCV 03/02/2016 86  79 - 97 fL Final  . MCH 03/02/2016 28.5  26.6 -  33.0 pg Final  . MCHC 03/02/2016 33.3  31.5 - 35.7 g/dL Final  . RDW 03/02/2016 14.3  12.3 - 15.4 % Final  . Platelets 03/02/2016 142* 150 - 379 x10E3/uL Final  . Neutrophils 03/02/2016 61   Final  . Lymphs 03/02/2016 24   Final  . Monocytes 03/02/2016 13   Final  . Eos 03/02/2016 2   Final  . Basos 03/02/2016 0   Final  . Neutrophils Absolute 03/02/2016 3.9  1.4 - 7.0 x10E3/uL Final  . Lymphocytes Absolute 03/02/2016 1.6  0.7 - 3.1 x10E3/uL Final  . Monocytes Absolute 03/02/2016 0.9  0.1 - 0.9 x10E3/uL Final  . EOS (ABSOLUTE) 03/02/2016 0.2  0.0 - 0.4 x10E3/uL Final  . Basophils Absolute 03/02/2016 0.0  0.0 - 0.2 x10E3/uL Final  . Immature Granulocytes 03/02/2016 0   Final  . Immature Grans (Abs) 03/02/2016 0.0  0.0 - 0.1 x10E3/uL Final  . Cholesterol, Total 03/02/2016 97* 100 - 199 mg/dL Final  . Triglycerides 03/02/2016 134  0 - 149 mg/dL Final  . HDL 03/02/2016 35* >39 mg/dL Final  . VLDL Cholesterol Cal 03/02/2016 27  5 - 40 mg/dL Final  . LDL Calculated 03/02/2016 35  0 - 99 mg/dL Final  . Chol/HDL Ratio 03/02/2016 2.8  0.0 - 5.0 ratio units Final   Comment:                                   T. Chol/HDL Ratio                                             Men  Women  1/2 Avg.Risk  3.4    3.3                                   Avg.Risk  5.0    4.4                                2X Avg.Risk  9.6    7.1                                3X Avg.Risk 23.4   11.0   . Hgb A1c MFr Bld 03/02/2016 9.4* 4.8 - 5.6 % Final   Comment:          Pre-diabetes: 5.7 - 6.4          Diabetes: >6.4          Glycemic control for adults with diabetes: <7.0   . Est. average glucose Bld gHb Est-m* 03/02/2016 223   Final  . Glucose 03/02/2016 230* 65 - 99 mg/dL Final  . BUN 03/02/2016 18  8 - 27 mg/dL Final  . Creatinine, Ser 03/02/2016 0.89  0.76 - 1.27 mg/dL Final  . GFR calc non Af Amer 03/02/2016 85  >59 mL/min/1.73 Final  . GFR calc Af Amer 03/02/2016 98  >59 mL/min/1.73  Final  . BUN/Creatinine Ratio 03/02/2016 20  10 - 22 Final  . Sodium 03/02/2016 144  134 - 144 mmol/L Final  . Potassium 03/02/2016 3.9  3.5 - 5.2 mmol/L Final  . Chloride 03/02/2016 102  96 - 106 mmol/L Final  . CO2 03/02/2016 22  18 - 29 mmol/L Final  . Calcium 03/02/2016 8.9  8.6 - 10.2 mg/dL Final  . Total Protein 03/02/2016 6.4  6.0 - 8.5 g/dL Final  . Albumin 03/02/2016 3.8  3.5 - 4.8 g/dL Final  . Globulin, Total 03/02/2016 2.6  1.5 - 4.5 g/dL Final  . Albumin/Globulin Ratio 03/02/2016 1.5  1.1 - 2.5 Final   Comment: **Effective March 07, 2016 the reference interval**   for A/G Ratio will be changing to:              Age                Male          Male           0 -  7 days       1.1 - 2.3       1.1 - 2.3           8 - 30 days       1.2 - 2.8       1.2 - 2.8           1 -  6 months     1.3 - 3.6       1.3 - 3.6    7 months -  5 years      1.5 - 2.6       1.5 - 2.6              > 5 years      1.2 - 2.2       1.2 - 2.2   . Bilirubin Total 03/02/2016 0.3  0.0 - 1.2 mg/dL Final  . Alkaline Phosphatase 03/02/2016 61  39 - 117 IU/L Final  . AST 03/02/2016 16  0 - 40 IU/L Final  . ALT 03/02/2016 20  0 - 44 IU/L Final  Anti-coag visit on 02/11/2016  Component Date Value Ref Range Status  . INR 02/11/2016 2.3   Final  Anti-coag visit on 01/19/2016  Component Date Value Ref Range Status  . INR 01/19/2016 2.0   Final  Anti-coag visit on 01/05/2016  Component Date Value Ref Range Status  . INR 01/05/2016 1.6   Final    No results found.   Assessment/Plan   ICD-9-CM ICD-10-CM   1. Type 2 diabetes mellitus with diabetic autonomic neuropathy, with long-term current use of insulin (HCC) 250.60 [id number] Basic Metabolic Panel   [id number] [id number] ALT   V58.67  Hemoglobin A1c     Microalbumin/Creatinine Ratio, Urine  2. Essential hypertension 401.9 I10   3. Atrial fibrillation, unspecified 427.31 I48.91   4. Chronic systolic congestive heart failure (HCC) 428.22 I50.22    428.0    5.  Hyperlipidemia 272.4 E78.5 Lipid Panel  6. PVD (peripheral vascular disease) (HCC) 443.9 I73.9   7. Personal history of malignant neoplasm of prostate V10.46 Z85.46    Increase Toujeo to 20 units daily  Continue other medications as ordered  Follow up with specialists as scheduled  Follow up in 3 mos for routine visit. Fasting labs prior to appt  [place] S. Perlie Gold  [facility] [address] [place], [place] [address] [phone] Cell (Monday-Friday 8 AM - 5 PM) [phone] After 5 PM and follow prompts

## 2016-03-10 ENCOUNTER — Ambulatory Visit (INDEPENDENT_AMBULATORY_CARE_PROVIDER_SITE_OTHER): Payer: Medicare Other | Admitting: Pharmacist

## 2016-03-10 DIAGNOSIS — Z5181 Encounter for therapeutic drug level monitoring: Secondary | ICD-10-CM

## 2016-03-10 DIAGNOSIS — I480 Paroxysmal atrial fibrillation: Secondary | ICD-10-CM

## 2016-03-10 DIAGNOSIS — I4891 Unspecified atrial fibrillation: Secondary | ICD-10-CM | POA: Diagnosis not present

## 2016-03-10 LAB — POCT INR: INR: 2.8

## 2016-03-21 ENCOUNTER — Other Ambulatory Visit: Payer: Self-pay | Admitting: Cardiovascular Disease

## 2016-03-25 ENCOUNTER — Other Ambulatory Visit: Payer: Self-pay | Admitting: *Deleted

## 2016-03-25 DIAGNOSIS — E1143 Type 2 diabetes mellitus with diabetic autonomic (poly)neuropathy: Secondary | ICD-10-CM

## 2016-03-25 MED ORDER — GLIPIZIDE 5 MG PO TABS: ORAL_TABLET | ORAL | Status: DC

## 2016-03-25 NOTE — Telephone Encounter (Signed)
[facility]  

## 2016-04-07 IMAGING — CR DG CHEST DECUBITUS BILAT
2 series · 2 of 2 positions shown · non-contrast
Comparison: CT of 08/22/2014. Chest radiograph most recent
04/23/2014

CLINICAL DATA: Short of breath for 1 month. Recent CT demonstrating
pleural effusions.

EXAM:
CHEST - BILATERAL DECUBITUS VIEW; CHEST - 2 VIEW

[w chest decub. (1 of 2)]
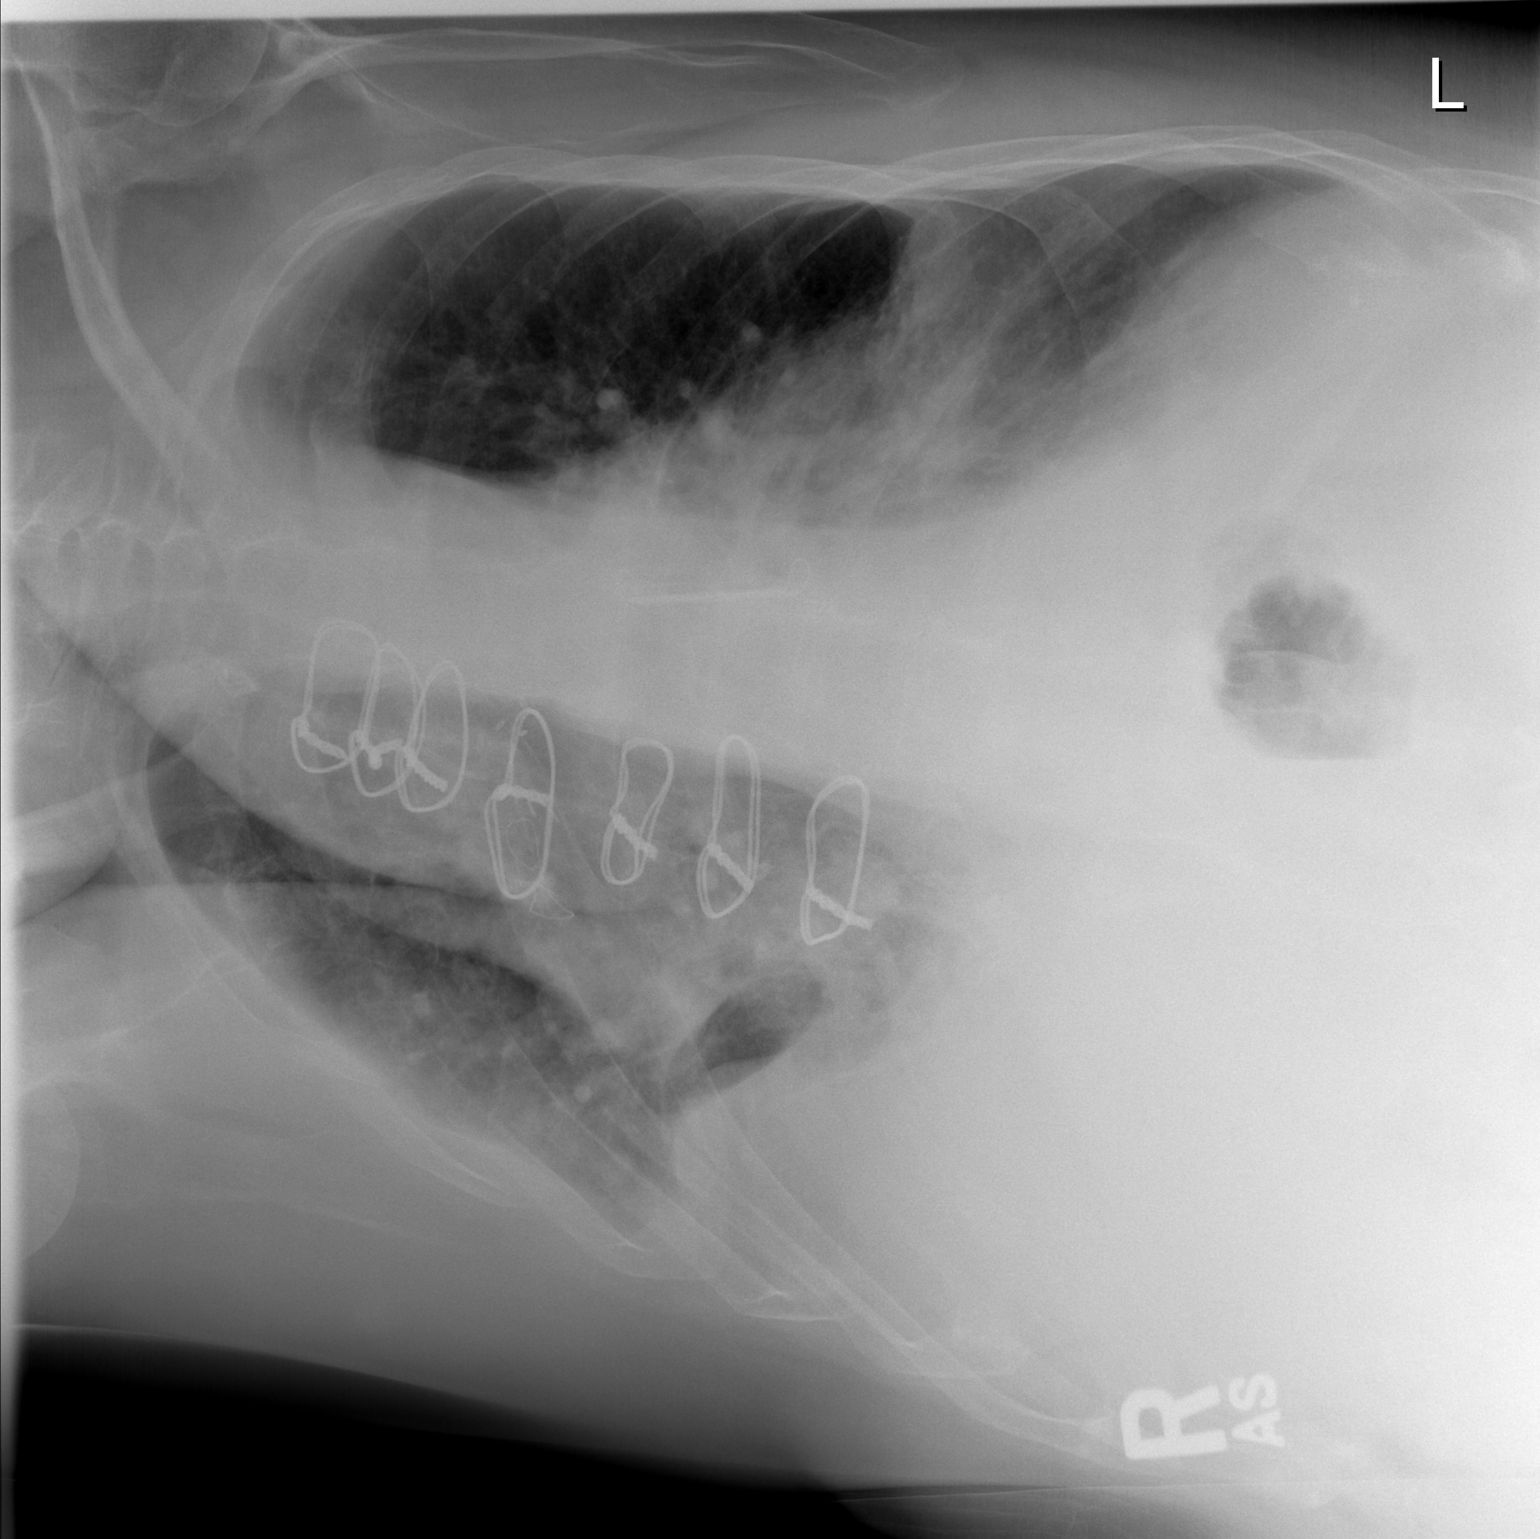

[w chest decub. (2 of 2)]
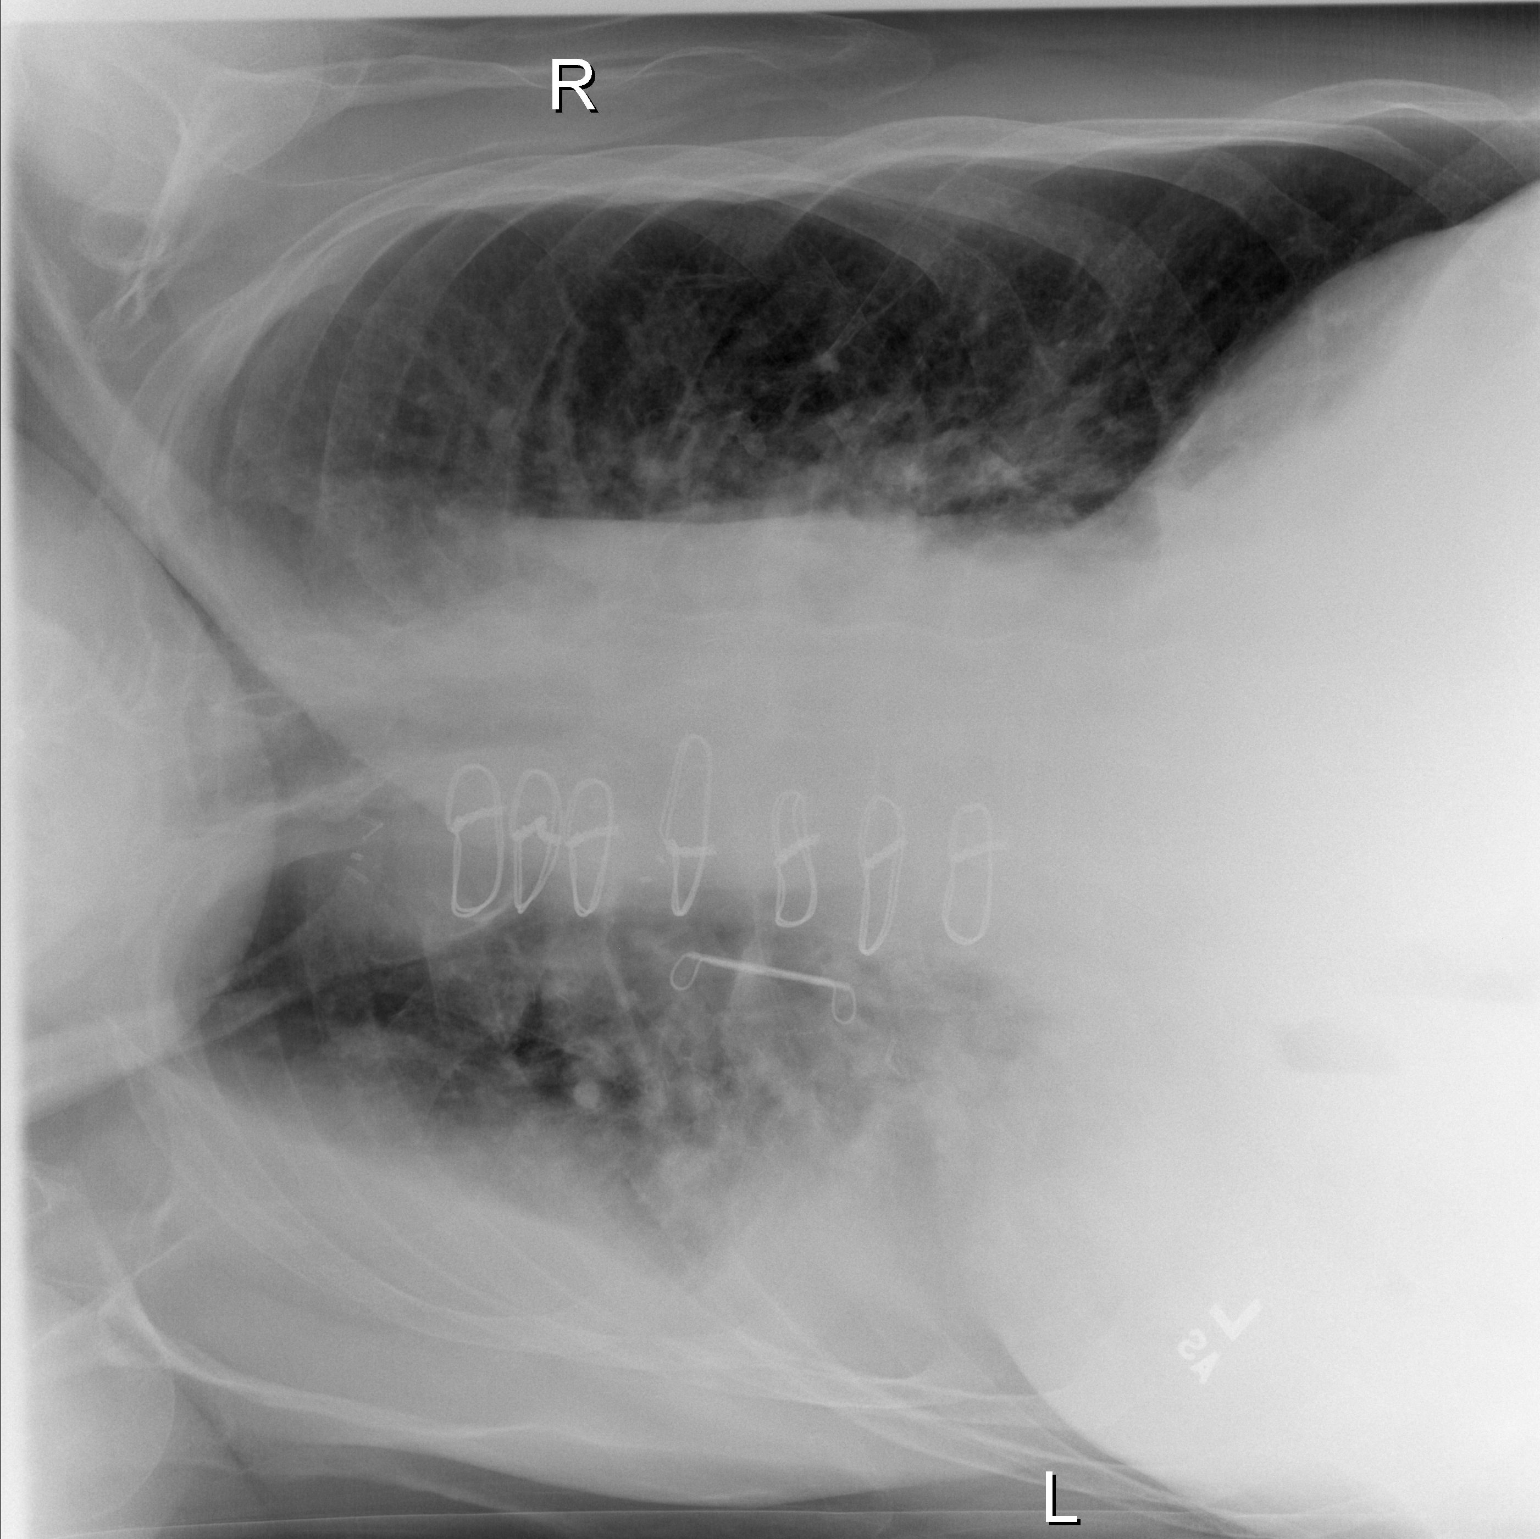

[2 of 2 positions shown; findings below may reference images not displayed]

FINDINGS: PA and lateral chest radiographs demonstrate prior median
sternotomy. Left atrial appendage occlusion device. Midline trachea.
moderate cardiomegaly. Small left greater than right pleural
effusions. No pneumatosis or free intraperitoneal air. mild
pulmonary venous congestion. Patchy left greater than right
bibasilar airspace disease.

Bilateral decubitus images. These demonstrate free-flowing bilateral
pleural effusions.
IMPRESSION: Small, left greater than right bilateral pleural effusions, without
loculation identified.

Suspicion of mild pulmonary venous congestion with left greater than
right bibasilar atelectasis.

## 2016-04-07 IMAGING — CR DG CHEST 2V
2 series · 2 of 2 positions shown · non-contrast
Comparison: CT of 08/22/2014. Chest radiograph most recent
04/23/2014

CLINICAL DATA: Short of breath for 1 month. Recent CT demonstrating
pleural effusions.

EXAM:
CHEST - BILATERAL DECUBITUS VIEW; CHEST - 2 VIEW

[w chest pa]
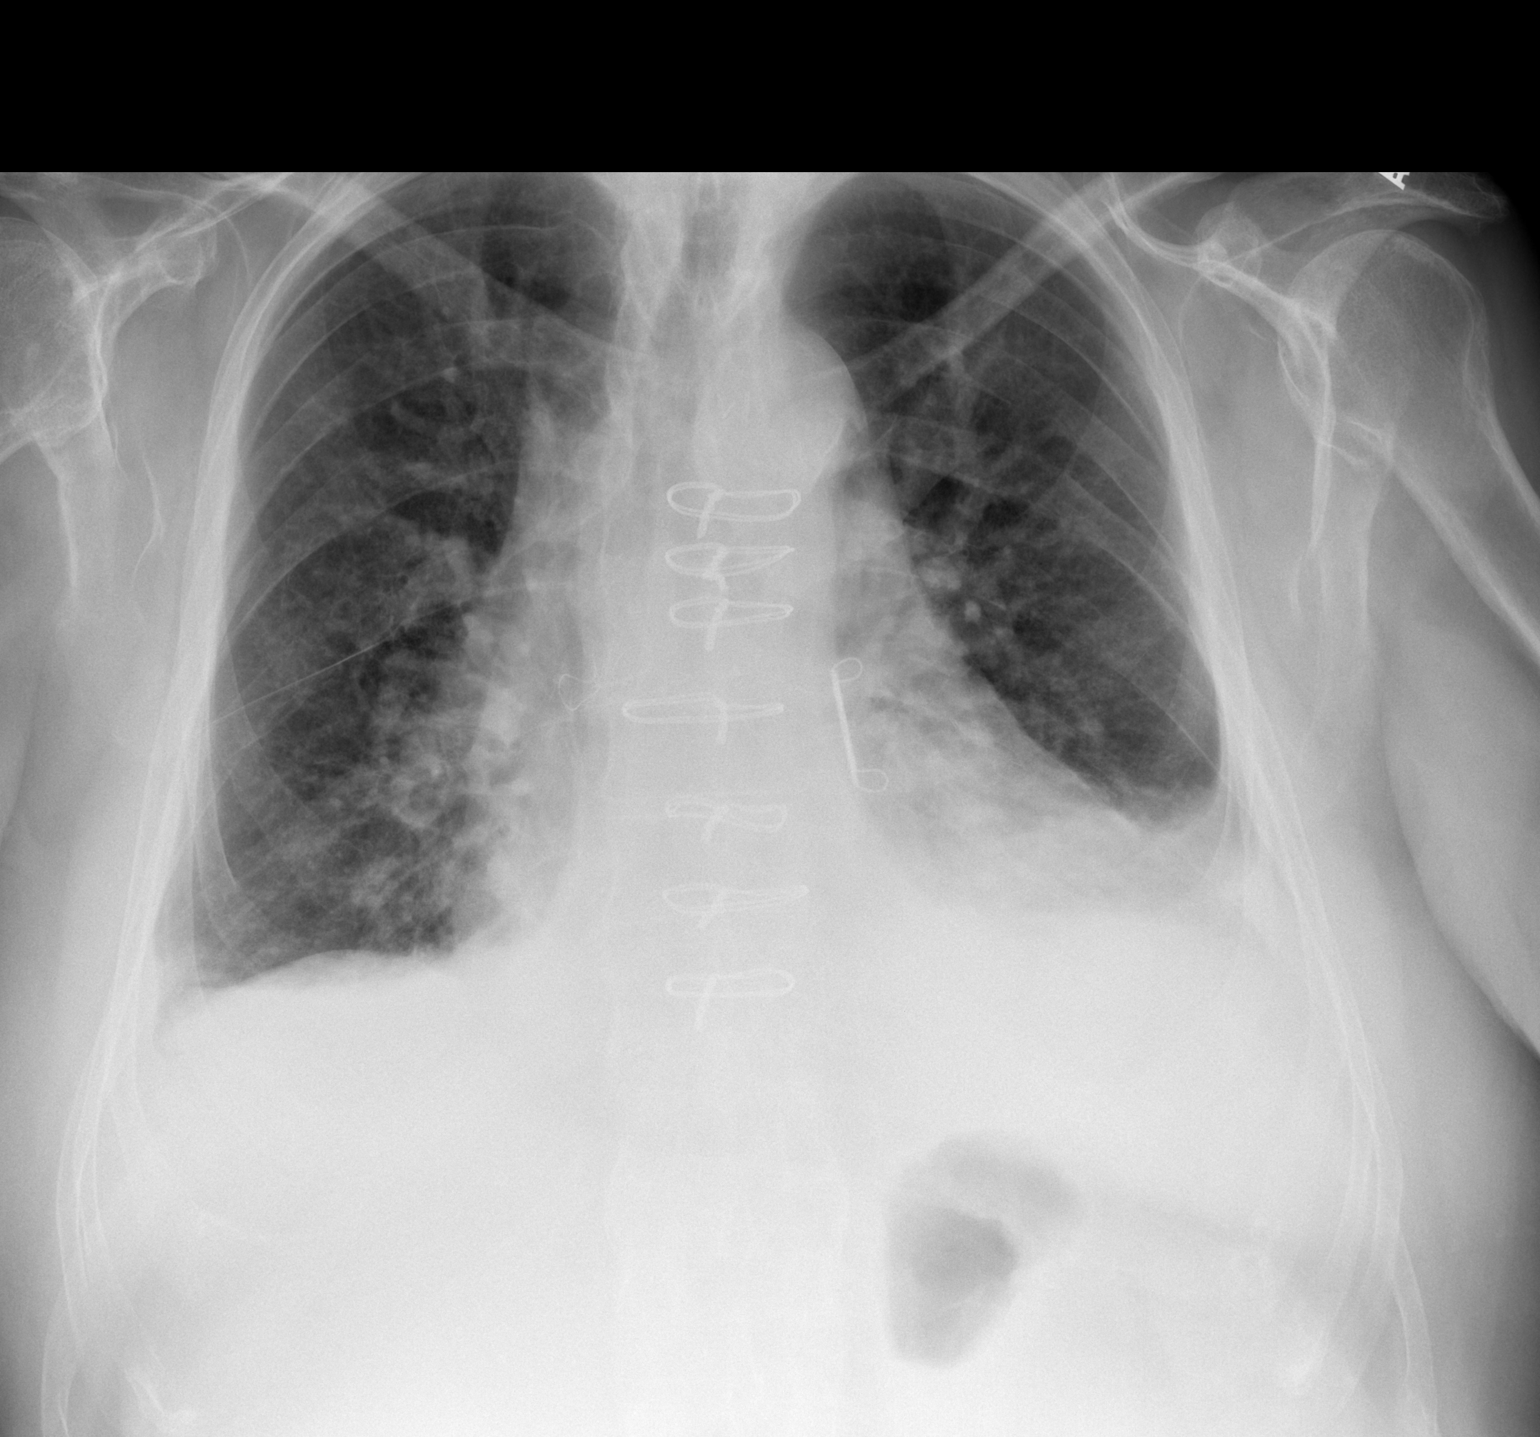

[w chest lat]
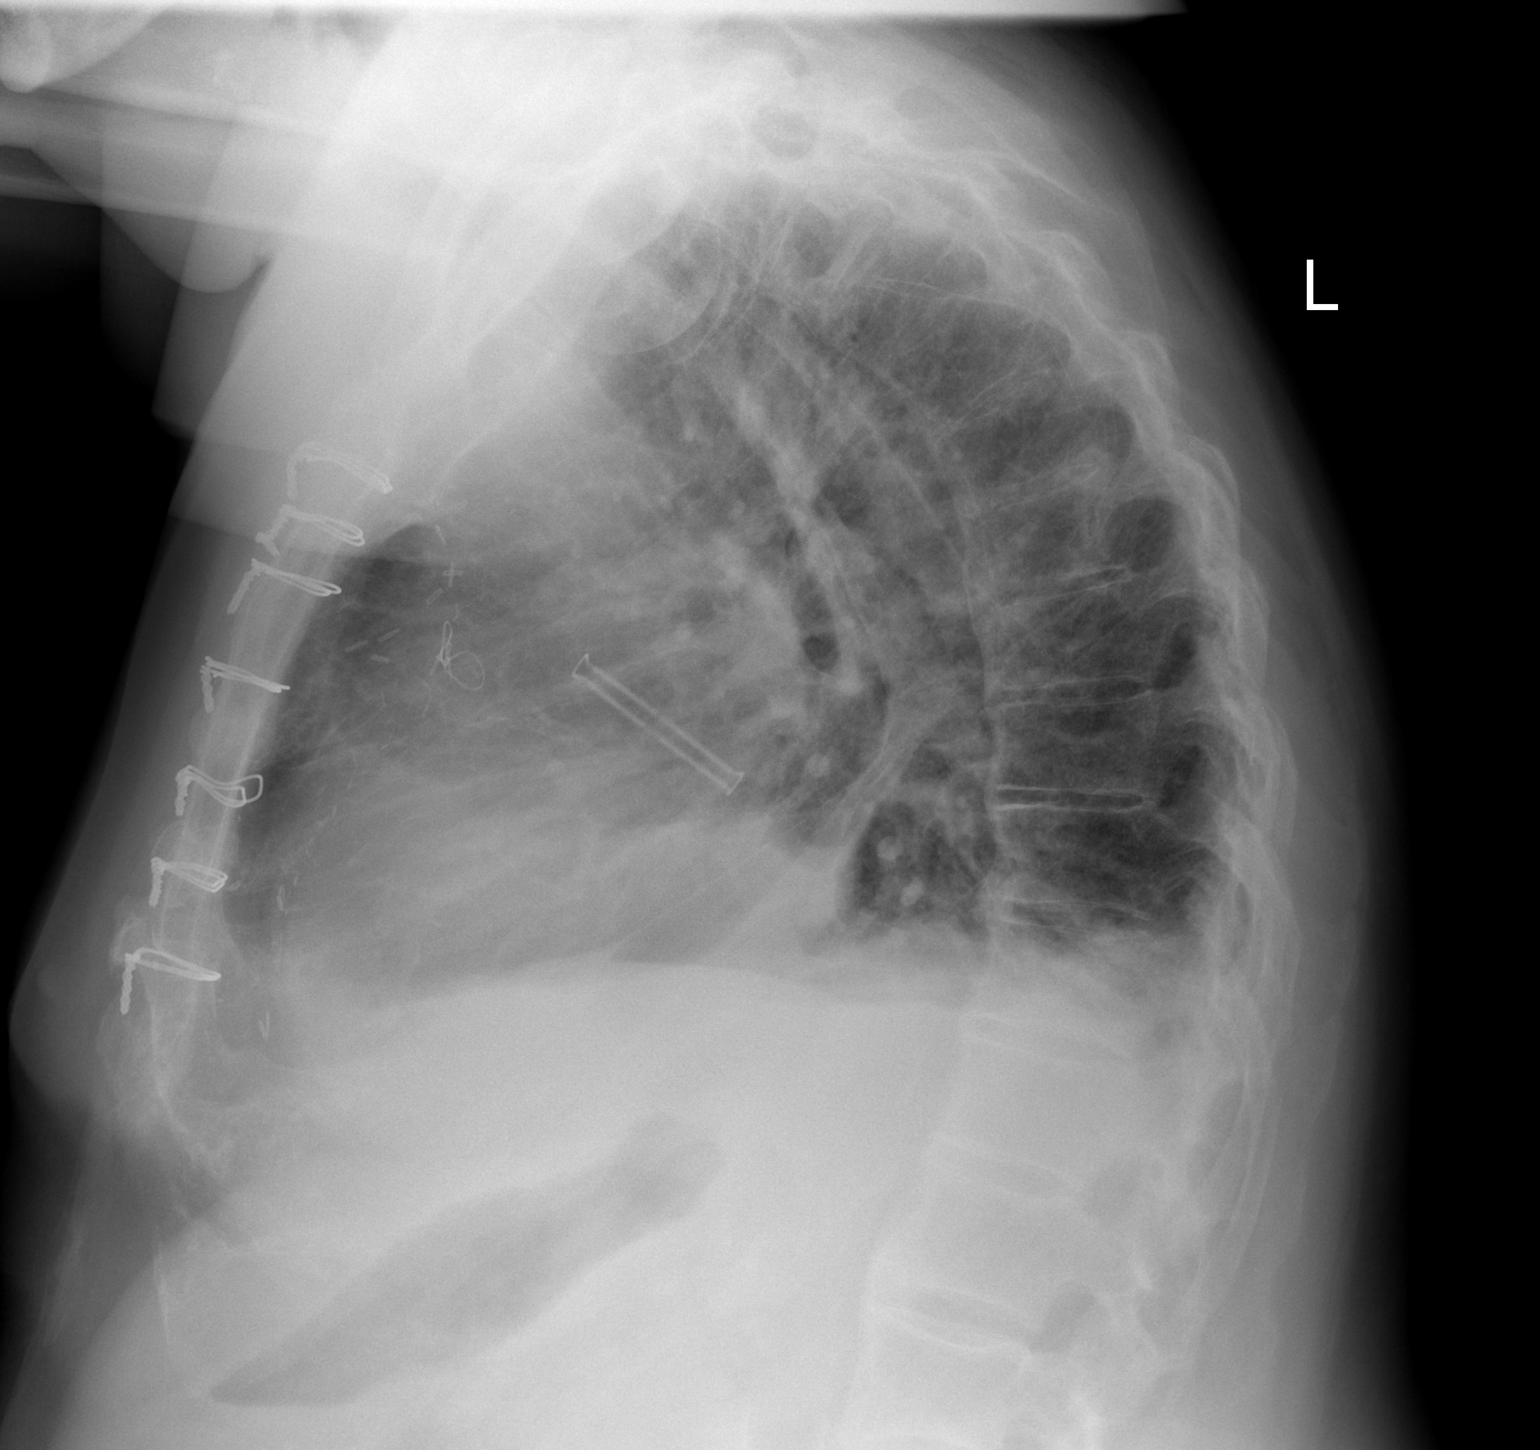

[2 of 2 positions shown; findings below may reference images not displayed]

FINDINGS: PA and lateral chest radiographs demonstrate prior median
sternotomy. Left atrial appendage occlusion device. Midline trachea.
moderate cardiomegaly. Small left greater than right pleural
effusions. No pneumatosis or free intraperitoneal air. mild
pulmonary venous congestion. Patchy left greater than right
bibasilar airspace disease.

Bilateral decubitus images. These demonstrate free-flowing bilateral
pleural effusions.
IMPRESSION: Small, left greater than right bilateral pleural effusions, without
loculation identified.

Suspicion of mild pulmonary venous congestion with left greater than
right bibasilar atelectasis.

## 2016-04-21 ENCOUNTER — Ambulatory Visit (INDEPENDENT_AMBULATORY_CARE_PROVIDER_SITE_OTHER): Payer: Medicare Other | Admitting: *Deleted

## 2016-04-21 DIAGNOSIS — I480 Paroxysmal atrial fibrillation: Secondary | ICD-10-CM

## 2016-04-21 DIAGNOSIS — I4891 Unspecified atrial fibrillation: Secondary | ICD-10-CM

## 2016-04-21 DIAGNOSIS — Z5181 Encounter for therapeutic drug level monitoring: Secondary | ICD-10-CM

## 2016-04-21 LAB — POCT INR: INR: 2.1

## 2016-05-25 ENCOUNTER — Other Ambulatory Visit: Payer: Self-pay

## 2016-05-25 MED ORDER — POTASSIUM CHLORIDE CRYS ER 20 MEQ PO TBCR: 20.0000 meq | EXTENDED_RELEASE_TABLET | Freq: Every day | ORAL | Status: DC

## 2016-05-25 NOTE — Telephone Encounter (Signed)
Refill sent electronically to pharmacy. Potassium Cl ER 20 meq tablets. # 90 no RF

## 2016-06-08 ENCOUNTER — Other Ambulatory Visit: Payer: Medicare Other

## 2016-06-08 ENCOUNTER — Ambulatory Visit (INDEPENDENT_AMBULATORY_CARE_PROVIDER_SITE_OTHER): Payer: Medicare Other | Admitting: *Deleted

## 2016-06-08 DIAGNOSIS — E1143 Type 2 diabetes mellitus with diabetic autonomic (poly)neuropathy: Secondary | ICD-10-CM | POA: Diagnosis not present

## 2016-06-08 DIAGNOSIS — Z794 Long term (current) use of insulin: Secondary | ICD-10-CM | POA: Diagnosis not present

## 2016-06-08 DIAGNOSIS — E785 Hyperlipidemia, unspecified: Secondary | ICD-10-CM

## 2016-06-08 DIAGNOSIS — Z5181 Encounter for therapeutic drug level monitoring: Secondary | ICD-10-CM | POA: Diagnosis not present

## 2016-06-08 DIAGNOSIS — I480 Paroxysmal atrial fibrillation: Secondary | ICD-10-CM

## 2016-06-08 DIAGNOSIS — I4891 Unspecified atrial fibrillation: Secondary | ICD-10-CM | POA: Diagnosis not present

## 2016-06-08 LAB — POCT INR: INR: 2.4

## 2016-06-09 LAB — BASIC METABOLIC PANEL
BUN/Creatinine Ratio: 16 (ref 10–24)
BUN: 16 mg/dL (ref 8–27)
CO2: 21 mmol/L (ref 18–29)
Calcium: 9.3 mg/dL (ref 8.6–10.2)
Chloride: 102 mmol/L (ref 96–106)
Creatinine, Ser: 0.97 mg/dL (ref 0.76–1.27)
GFR calc Af Amer: 89 mL/min/{1.73_m2} (ref >59)
GFR calc non Af Amer: 77 mL/min/{1.73_m2} (ref >59)
Glucose: 226 mg/dL — ABNORMAL HIGH (ref 65–99)
Potassium: 3.8 mmol/L (ref 3.5–5.2)
Sodium: 143 mmol/L (ref 134–144)

## 2016-06-09 LAB — LIPID PANEL
Chol/HDL Ratio: 2.7 ratio units (ref 0.0–5.0)
Cholesterol, Total: 101 mg/dL (ref 100–199)
HDL: 37 mg/dL — ABNORMAL LOW (ref >39)
LDL Calculated: 29 mg/dL (ref 0–99)
Triglycerides: 174 mg/dL — ABNORMAL HIGH (ref 0–149)
VLDL Cholesterol Cal: 35 mg/dL (ref 5–40)

## 2016-06-09 LAB — HEMOGLOBIN A1C
Est. average glucose Bld gHb Est-mCnc: 235 mg/dL
Hgb A1c MFr Bld: 9.8 % — ABNORMAL HIGH (ref 4.8–5.6)

## 2016-06-09 LAB — MICROALBUMIN / CREATININE URINE RATIO
Creatinine, Urine: 282.4 mg/dL
MICROALB/CREAT RATIO: 445.6 mg/g creat — ABNORMAL HIGH (ref 0.0–30.0)
Microalbumin, Urine: 1258.5 ug/mL

## 2016-06-09 LAB — ALT: ALT: 20 IU/L (ref 0–44)

## 2016-06-10 ENCOUNTER — Encounter: Payer: Self-pay | Admitting: Internal Medicine

## 2016-06-10 ENCOUNTER — Other Ambulatory Visit: Payer: Self-pay

## 2016-06-10 ENCOUNTER — Ambulatory Visit (INDEPENDENT_AMBULATORY_CARE_PROVIDER_SITE_OTHER): Payer: Medicare Other | Admitting: Internal Medicine

## 2016-06-10 ENCOUNTER — Ambulatory Visit
Admission: RE | Admit: 2016-06-10 | Discharge: 2016-06-10 | Disposition: A | Discharge status: Home / Self Care | Payer: Medicare Other | Source: Ambulatory Visit | Attending: Internal Medicine | Admitting: Internal Medicine

## 2016-06-10 VITALS — BP 142/80 | HR 65 | Temp 97.7°F | Ht 70.0 in | Wt 257.8 lb

## 2016-06-10 DIAGNOSIS — M25571 Pain in right ankle and joints of right foot: Secondary | ICD-10-CM

## 2016-06-10 DIAGNOSIS — Z794 Long term (current) use of insulin: Secondary | ICD-10-CM

## 2016-06-10 DIAGNOSIS — E785 Hyperlipidemia, unspecified: Secondary | ICD-10-CM | POA: Diagnosis not present

## 2016-06-10 DIAGNOSIS — I4891 Unspecified atrial fibrillation: Secondary | ICD-10-CM

## 2016-06-10 DIAGNOSIS — I10 Essential (primary) hypertension: Secondary | ICD-10-CM

## 2016-06-10 DIAGNOSIS — I739 Peripheral vascular disease, unspecified: Secondary | ICD-10-CM | POA: Diagnosis not present

## 2016-06-10 DIAGNOSIS — E1143 Type 2 diabetes mellitus with diabetic autonomic (poly)neuropathy: Secondary | ICD-10-CM | POA: Diagnosis not present

## 2016-06-10 DIAGNOSIS — M79671 Pain in right foot: Secondary | ICD-10-CM | POA: Diagnosis not present

## 2016-06-10 DIAGNOSIS — I2510 Atherosclerotic heart disease of native coronary artery without angina pectoris: Secondary | ICD-10-CM

## 2016-06-10 DIAGNOSIS — M7751 Other enthesopathy of right foot: Secondary | ICD-10-CM

## 2016-06-10 DIAGNOSIS — Z23 Encounter for immunization: Secondary | ICD-10-CM

## 2016-06-10 DIAGNOSIS — I5022 Chronic systolic (congestive) heart failure: Secondary | ICD-10-CM

## 2016-06-10 NOTE — Patient Instructions (Addendum)
May take Tylenol ES 500mg  2 tabs in PM if having pain.   Avoid ibuprofen/advil/aleve/naprosyn due to history of kidney problems and heart problems  Will call with xray results  Increase Toujeo to 26 units daily  prevnar vaccine given today  Continue other medications as ordered  Follow up in 3 mos for routine vist. Keep appts with specialists as scheduled

## 2016-06-10 NOTE — Progress Notes (Signed)
Patient ID: Seth [name], male   DOB: 10/02/1941, 75 y.o.   MRN: [id number]    Location:  [facility] Place of Service: OFFICE  Chief Complaint  Patient presents with  . Medical Management of Chronic Issues    3 month follow up  . Immunizations    discuss pneum. 23    HPI:  71 p male seen today for f/u. He reports pain in right ankle/foot and leg worse at night. He has not tried tylenol. Rarely takes ibuprofen. He reports distant ankle fx >50 yrs ago. He has not tried ice/heat. Pain makes it difficult to ambulate  DM - BS elevated at home 180-190s and occasionally as high as 200s. No low BS reactions. He reports no numbness or tingling. He is on toujeo 20 units daily, metformin, glipizide. A1c 9.8%. Urine microalbumin/Cr ratio 445.6 (prev. 504.0)  CAD/ICM/HTN/USA/HF - stable on ASA, coumadin, lipitor, coreg, lasix with KCL, lisinopril. No CP or SOB. Followed by cardio. LDL 29.   PAD - stable on ASA and coumadin. INR 2.4  afib - rate controlled on coreg. On coumadin with last INR 2.4. Followed by cardio  CKD - stable with nml Cr 0.97. Followed by nephrology  Hx prostate CA - stable. Followed by urology  Hyperlipidemia - stable on lipitor. LDL 35. No myalgias  Anemia - stable. Hgb 13.8   Past Medical History  Diagnosis Date  . Impotence of organic origin   . CKD (chronic kidney disease)     proteinuria  . Type II or unspecified type diabetes mellitus with renal manifestations, uncontrolled   . Peripheral vascular disease, unspecified ([place])   . HTN (hypertension)   . Hemorrhage of rectum and anus   . Prostate cancer ([place])   . Thrombocytopenia, unspecified ([place])   . Tobacco use disorder   . Nephrolithiasis   . Hemorrhage of gastrointestinal tract, unspecified   . HLD (hyperlipidemia)   . CAD (coronary artery disease)     a.  Lexiscan Myoview (02/14/14):  Apical, inferolateral scar; ? Mild ischemia; EF 32%.;  b.  LHC (02/21/14):  dLM 100%, LAD 100%, CFX 100%, dRCA 50%. => CABG  (L-LAD, S-OM, S-PDA)  . Ischemic cardiomyopathy     a. Echo (02/17/14):  EF 35-40%, anteroseptal and apical AK, Gr 2 DD, MAC, mod LAE.  [name] Chronic systolic heart failure ([place])   . Atrial fibrillation ([place])     a. coumadin;  b. Amiodarone    Past Surgical History  Procedure Laterality Date  . Back surgery    . Cataract extraction  2010  . Coronary artery bypass graft N/A 02/24/2014    Procedure: Coronary artery bypass graft times three using left internal mammary artery and left leg greater saphenous vein harvested endoscopically.;  Surgeon: Gaye Pollack, MD;  [facility];  Service: Open Heart Surgery;  Laterality: N/A;  . Intraoperative transesophageal echocardiogram N/A 02/24/2014    Procedure: INTRAOPERATIVE TRANSESOPHAGEAL ECHOCARDIOGRAM;  Surgeon: Gaye Pollack, MD;  Location: [facility] OR;  Service: Open Heart Surgery;  Laterality: N/A;  . Clipping of atrial appendage Left 02/24/2014    Procedure: CLIPPING OF ATRIAL APPENDAGE;  Surgeon: Gaye Pollack, MD;  Location: [facility];  Service: Open Heart Surgery;  Laterality: Left;  . Colonoscopy  2005    Normal  . Left heart catheterization with coronary angiogram N/A 02/21/2014    Procedure: LEFT HEART CATHETERIZATION WITH CORONARY ANGIOGRAM;  Surgeon: Peter M Martinique, MD;  Location: [facility] CATH LAB;  Service: Cardiovascular;  Laterality:  N/A;    Patient Care Team: Gildardo Cranker, DO as PCP - General (Internal Medicine) [name], MD as Attending Physician (Urology) [name], MD as Consulting Physician (Nephrology)  Social History   Social History  . Marital Status: Married    Spouse Name: N/A  . Number of Children: 3  . Years of Education: N/A   Occupational History  . Sell storage buildings    Social History Main Topics  . Smoking status: Never Smoker   . Smokeless tobacco: Former Systems developer    Types: Chew    Quit date: 02/23/2014  . Alcohol Use: No  . Drug Use: No  . Sexual Activity: Not on file   Other Topics Concern  . Not on  file   Social History Narrative     reports that he has never smoked. He quit smokeless tobacco use about 2 years ago. His smokeless tobacco use included Chew. He reports that he does not drink alcohol or use illicit drugs.  Family History  Problem Relation Age of Onset  . Cancer Mother 9    leukemia  . Heart disease Father   . Heart attack Father 55   Family Status  Relation Status Death Age  . Mother Deceased   . Father Deceased 27    Heart attack   . Sister Deceased     unknown  . Brother Alive   . Daughter Alive   . Daughter Alive   . Daughter Alive      No Known Allergies  Medications: Patient's Medications  New Prescriptions   No medications on file  Previous Medications   ASPIRIN EC 81 MG TABLET    Take 1 tablet (81 mg total) by mouth daily.   ATORVASTATIN (LIPITOR) 40 MG TABLET    TAKE 1 TABLET BY MOUTH DAILY.   CALCIUM CARBONATE-VITAMIN D 600-200 MG-UNIT TABS    Take 1 tablet by mouth daily. For bones    CARVEDILOL (COREG) 12.5 MG TABLET    Take 25 mg by mouth 2 (two) times daily with a meal.    FLAXSEED, LINSEED, (FLAXSEED OIL PO)    Take 1 tablet by mouth daily.    FUROSEMIDE (LASIX) 40 MG TABLET    Take 2 tablets by mouth in the morning and 1 tablet by mouth in the evening   FUROSEMIDE (LASIX) 40 MG TABLET    TAKE 2 TABLETS BY MOUTH EVERY MORNING AND 1TAB EVERY EVENING   GLIPIZIDE (GLUCOTROL) 5 MG TABLET    Take two tablets by mouth in the morning and take one tablet by mouth in the evening to control blood sugar.   INSULIN GLARGINE (TOUJEO SOLOSTAR) 300 UNIT/ML SOPN    Inject 20 Units into the skin daily.   JANTOVEN 5 MG TABLET    TAKE 1 TABLET BY MOUTH DAILY OR AS DIRECTED BY COUMADIN CLINIC   LISINOPRIL (PRINIVIL,ZESTRIL) 20 MG TABLET    Take 20 mg by mouth. 2 tablets by mouth in the am, 2 tablets by mouth in the pm per kidney doctor   METFORMIN (GLUCOPHAGE) 1000 MG TABLET    Take one tablet by mouth twice daily for diabetes   MULTIPLE VITAMIN  (MULTIVITAMINS PO)    Take 1 tablet by mouth daily.     POTASSIUM CHLORIDE SA (K-DUR,KLOR-CON) 20 MEQ TABLET    Take 1 tablet (20 mEq total) by mouth daily.  Modified Medications   No medications on file  Discontinued Medications   INSULIN GLARGINE (TOUJEO SOLOSTAR) 300 UNIT/ML  SOPN    Inject 16 Units into the skin daily.    Review of Systems  Musculoskeletal: Positive for joint swelling, arthralgias and gait problem.  All other systems reviewed and are negative.   Filed Vitals:   06/10/16 0914  BP: 142/80  Pulse: 65  Temp: 97.7 F (36.5 C)  TempSrc: Oral  Height: 5\' 10"  (1.778 m)  Weight: 257 lb 12.8 oz (116.937 kg)  SpO2: 93%   Body mass index is 36.99 kg/(m^2).  Physical Exam  Constitutional: He is oriented to person, place, and time. He appears well-developed and well-nourished.  HENT:  Mouth/Throat: Oropharynx is clear and moist. No oropharyngeal exudate.  Eyes: Pupils are equal, round, and reactive to light. No scleral icterus.  Neck: Neck supple. Carotid bruit is not present.  Cardiovascular: Regular rhythm and intact distal pulses.  Bradycardia present.  Exam reveals no gallop and no friction rub.   Murmur (1/6 SEM) heard. Trace b/l LE swelling. No calf TTP  Pulmonary/Chest: Effort normal and breath sounds normal. He has no wheezes. He has no rales. He exhibits no tenderness.  Abdominal: Soft. Bowel sounds are normal. He exhibits no distension, no abdominal bruit, no pulsatile midline mass and no mass. There is no tenderness. There is no rebound and no guarding.  obese  Musculoskeletal: He exhibits edema and tenderness.  Right ankle reduced ROM with swelling; strength 4/5 with plantarflexion  Lymphadenopathy:    He has no cervical adenopathy.  Neurological: He is alert and oriented to person, place, and time.  Skin: Skin is warm and dry.  Psychiatric: He has a normal mood and affect. His behavior is normal. Judgment and thought content normal.     Labs  reviewed: Appointment on 06/08/2016  Component Date Value Ref Range Status  . Glucose 06/08/2016 226* 65 - 99 mg/dL Final  . BUN 06/08/2016 16  8 - 27 mg/dL Final  . Creatinine, Ser 06/08/2016 0.97  0.76 - 1.27 mg/dL Final  . GFR calc non Af Amer 06/08/2016 77  >59 mL/min/1.73 Final  . GFR calc Af Amer 06/08/2016 89  >59 mL/min/1.73 Final  . BUN/Creatinine Ratio 06/08/2016 16  10 - 24 Final  . Sodium 06/08/2016 143  134 - 144 mmol/L Final  . Potassium 06/08/2016 3.8  3.5 - 5.2 mmol/L Final  . Chloride 06/08/2016 102  96 - 106 mmol/L Final  . CO2 06/08/2016 21  18 - 29 mmol/L Final  . Calcium 06/08/2016 9.3  8.6 - 10.2 mg/dL Final  . ALT 06/08/2016 20  0 - 44 IU/L Final  . Cholesterol, Total 06/08/2016 101  100 - 199 mg/dL Final  . Triglycerides 06/08/2016 174* 0 - 149 mg/dL Final  . HDL 06/08/2016 37* >39 mg/dL Final  . VLDL Cholesterol Cal 06/08/2016 35  5 - 40 mg/dL Final  . LDL Calculated 06/08/2016 29  0 - 99 mg/dL Final  . Chol/HDL Ratio 06/08/2016 2.7  0.0 - 5.0 ratio units Final   Comment:                                   T. Chol/HDL Ratio                                             Men  Women  1/2 Avg.Risk  3.4    3.3                                   Avg.Risk  5.0    4.4                                2X Avg.Risk  9.6    7.1                                3X Avg.Risk 23.4   11.0   . Hgb A1c MFr Bld 06/08/2016 9.8* 4.8 - 5.6 % Final   Comment:          Pre-diabetes: 5.7 - 6.4          Diabetes: >6.4          Glycemic control for adults with diabetes: <7.0   . Est. average glucose Bld gHb Est-m* 06/08/2016 235   Final  . Creatinine, Urine 06/08/2016 282.4  Not Estab. mg/dL Final  . Microalbum.,U,Random 06/08/2016 1258.5  Not Estab. ug/mL Final   Comment: Results confirmed on dilution.   [name] MICROALB/CREAT RATIO 06/08/2016 445.6* 0.0 - 30.0 mg/g creat Final  Anti-coag visit on 06/08/2016  Component Date Value Ref Range Status  . INR  06/08/2016 2.4   Final  Anti-coag visit on 04/21/2016  Component Date Value Ref Range Status  . INR 04/21/2016 2.1   Final    No results found.   Assessment/Plan   ICD-9-CM ICD-10-CM   1. Right ankle pain - new 719.47 M25.571 DG Foot Complete Right     DG Ankle Complete Right  2. Type 2 diabetes mellitus with diabetic autonomic neuropathy, with long-term current use of insulin (HCC) - uncontrolled; has diabetic nephropathy 250.60 E11.43 Pneumococcal conjugate vaccine 13-valent   337.1 Z79.4    V58.67    3. Essential hypertension - stable 401.9 I10   4. Chronic systolic congestive heart failure (HCC) 428.22 I50.22    428.0    5. Hyperlipidemia 272.4 E78.5   6. Atrial fibrillation, unspecified - rate controlled 427.31 I48.91   7. PVD (peripheral vascular disease) (HCC) 443.9 I73.9   8. Need for vaccination with 13-polyvalent pneumococcal conjugate vaccine V03.82 Z23 Pneumococcal conjugate vaccine 13-valent    May take Tylenol ES 500mg  2 tabs in PM if having pain.   Avoid ibuprofen/advil/aleve/naprosyn due to history of kidney problems and heart problems  Will call with xray results  Increase Toujeo to 26 units daily  prevnar vaccine given today  Continue other medications as ordered  Follow up in 3 mos for routine vist. Keep appts with specialists as scheduled  Kalyani Maeda S. Perlie Gold  [facility] [address] [place], [place] [address] [phone] Cell (Monday-Friday 8 AM - 5 PM) [phone] After 5 PM and follow prompts

## 2016-06-15 ENCOUNTER — Other Ambulatory Visit: Payer: Self-pay | Admitting: *Deleted

## 2016-06-15 MED ORDER — METFORMIN HCL 1000 MG PO TABS: ORAL_TABLET | ORAL | Status: DC

## 2016-06-29 ENCOUNTER — Ambulatory Visit (INDEPENDENT_AMBULATORY_CARE_PROVIDER_SITE_OTHER): Payer: Medicare Other | Admitting: Podiatry

## 2016-06-29 ENCOUNTER — Ambulatory Visit: Payer: Medicare Other

## 2016-06-29 ENCOUNTER — Encounter: Payer: Self-pay | Admitting: Podiatry

## 2016-06-29 VITALS — BP 152/76 | HR 71 | Resp 16 | Ht 70.0 in | Wt 260.0 lb

## 2016-06-29 DIAGNOSIS — I2510 Atherosclerotic heart disease of native coronary artery without angina pectoris: Secondary | ICD-10-CM | POA: Diagnosis not present

## 2016-06-29 DIAGNOSIS — M79671 Pain in right foot: Secondary | ICD-10-CM

## 2016-06-29 DIAGNOSIS — M7661 Achilles tendinitis, right leg: Secondary | ICD-10-CM

## 2016-06-29 MED ORDER — TRIAMCINOLONE ACETONIDE 10 MG/ML IJ SUSP
10.0000 mg | Freq: Once | INTRAMUSCULAR | Status: AC
  Administered 2016-06-29: 10 mg

## 2016-06-29 NOTE — Patient Instructions (Signed)

## 2016-06-29 NOTE — Progress Notes (Signed)
   Subjective:    Patient ID: Seth [name], male    DOB: [birth year], 75 y.o.   MRN: [id number]  HPI Chief Complaint  Patient presents with  . Foot Pain    Right foot; back of heel; pt stated, "Went to [place] on 06/10/16 to get x-rays"; pt diabetic type 2; sugar=166 this am; A1C=9.8       Review of Systems  HENT: Positive for hearing loss.   Skin: Positive for rash.  Hematological: Bruises/bleeds easily.  All other systems reviewed and are negative.      Objective:   Physical Exam        Assessment & Plan:

## 2016-06-29 NOTE — Progress Notes (Signed)
Subjective:     Patient ID: Seth [name], male   DOB: November 16, 1941, 75 y.o.   MRN: [id number]  HPI patient presents with pain in the back of the right heel stating it's been hurting for the last few months and making walking difficult. Patient states shoe gear is bothersome and ambulation is bothersome   Review of Systems  All other systems reviewed and are negative.      Objective:   Physical Exam  Constitutional: He is oriented to person, place, and time.  Cardiovascular: Intact distal pulses.   Musculoskeletal: Normal range of motion.  Neurological: He is oriented to person, place, and time.  Skin: Skin is warm.  Nursing note and vitals reviewed.  neurovascular status intact muscle strength adequate range of motion within normal limits with patient found to have quite a bit of discomfort in the posterior medial aspect of the right heel with normal Achilles tendon strength and structure. 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 At this point I do think careful injection is necessary and I did explain risk and he wants procedure. He understands chances for rupture and today I did a sterile prep of the medial side of the right heel and I injected with 3 mg dexamethasone Kenalog 5 mg Xylocaine and I then went ahead and I placed him into a short walking boot. I gave instructions on physical therapy and reduced activity and reappoint 3 weeks and also dispensed a silicone brace  X-rays indicated there is spur formation but no indications of stress fracture arthritis

## 2016-07-20 ENCOUNTER — Ambulatory Visit (INDEPENDENT_AMBULATORY_CARE_PROVIDER_SITE_OTHER): Payer: Medicare Other | Admitting: Podiatry

## 2016-07-20 DIAGNOSIS — M7661 Achilles tendinitis, right leg: Secondary | ICD-10-CM

## 2016-07-20 DIAGNOSIS — M79671 Pain in right foot: Secondary | ICD-10-CM | POA: Diagnosis not present

## 2016-07-20 DIAGNOSIS — I2510 Atherosclerotic heart disease of native coronary artery without angina pectoris: Secondary | ICD-10-CM

## 2016-07-20 NOTE — Patient Instructions (Signed)

## 2016-07-20 NOTE — Progress Notes (Signed)
Subjective:     Patient ID: Seth [name], male   DOB: [birth year], 75 y.o.   MRN: [id number]  HPI patient states my foot is improved some but still painful if I do a lot of walking   Review of Systems     Objective:   Physical Exam Neurovascular status intact muscle strength adequate with significant reduction of discomfort posterior heel right with inflammation still present when doing a lot of walking    Assessment:     Improve tendinitis but present    Plan:     Advised on continuation of immobilization stretching exercises heel lifts and anti-inflammatories. Reappoint if symptoms recur

## 2016-07-21 ENCOUNTER — Ambulatory Visit (INDEPENDENT_AMBULATORY_CARE_PROVIDER_SITE_OTHER): Payer: Medicare Other | Admitting: *Deleted

## 2016-07-21 DIAGNOSIS — Z5181 Encounter for therapeutic drug level monitoring: Secondary | ICD-10-CM

## 2016-07-21 DIAGNOSIS — I4891 Unspecified atrial fibrillation: Secondary | ICD-10-CM | POA: Diagnosis not present

## 2016-07-21 LAB — POCT INR: INR: 2.1

## 2016-07-26 DIAGNOSIS — R809 Proteinuria, unspecified: Secondary | ICD-10-CM | POA: Diagnosis not present

## 2016-07-26 DIAGNOSIS — I10 Essential (primary) hypertension: Secondary | ICD-10-CM | POA: Diagnosis not present

## 2016-07-26 DIAGNOSIS — Z6841 Body Mass Index (BMI) 40.0 and over, adult: Secondary | ICD-10-CM | POA: Diagnosis not present

## 2016-08-24 ENCOUNTER — Other Ambulatory Visit: Payer: Self-pay | Admitting: Cardiovascular Disease

## 2016-08-25 ENCOUNTER — Other Ambulatory Visit: Payer: Self-pay

## 2016-08-25 MED ORDER — POTASSIUM CHLORIDE CRYS ER 20 MEQ PO TBCR: 20.0000 meq | EXTENDED_RELEASE_TABLET | Freq: Every day | ORAL | 1 refills | Status: DC

## 2016-08-31 ENCOUNTER — Ambulatory Visit (INDEPENDENT_AMBULATORY_CARE_PROVIDER_SITE_OTHER): Payer: Medicare Other | Admitting: *Deleted

## 2016-08-31 DIAGNOSIS — I4891 Unspecified atrial fibrillation: Secondary | ICD-10-CM | POA: Diagnosis not present

## 2016-08-31 DIAGNOSIS — Z5181 Encounter for therapeutic drug level monitoring: Secondary | ICD-10-CM

## 2016-08-31 LAB — POCT INR: INR: 3.2

## 2016-09-16 ENCOUNTER — Ambulatory Visit (INDEPENDENT_AMBULATORY_CARE_PROVIDER_SITE_OTHER): Payer: Medicare Other | Admitting: Internal Medicine

## 2016-09-16 ENCOUNTER — Encounter: Payer: Self-pay | Admitting: Internal Medicine

## 2016-09-16 VITALS — BP 138/76 | HR 65 | Temp 97.6°F | Ht 70.0 in | Wt 254.2 lb

## 2016-09-16 DIAGNOSIS — Z794 Long term (current) use of insulin: Secondary | ICD-10-CM | POA: Diagnosis not present

## 2016-09-16 DIAGNOSIS — I5022 Chronic systolic (congestive) heart failure: Secondary | ICD-10-CM | POA: Diagnosis not present

## 2016-09-16 DIAGNOSIS — Z23 Encounter for immunization: Secondary | ICD-10-CM

## 2016-09-16 DIAGNOSIS — M25571 Pain in right ankle and joints of right foot: Secondary | ICD-10-CM

## 2016-09-16 DIAGNOSIS — I4891 Unspecified atrial fibrillation: Secondary | ICD-10-CM | POA: Diagnosis not present

## 2016-09-16 DIAGNOSIS — R42 Dizziness and giddiness: Secondary | ICD-10-CM | POA: Diagnosis not present

## 2016-09-16 DIAGNOSIS — I2510 Atherosclerotic heart disease of native coronary artery without angina pectoris: Secondary | ICD-10-CM

## 2016-09-16 DIAGNOSIS — E785 Hyperlipidemia, unspecified: Secondary | ICD-10-CM

## 2016-09-16 DIAGNOSIS — I10 Essential (primary) hypertension: Secondary | ICD-10-CM | POA: Diagnosis not present

## 2016-09-16 DIAGNOSIS — E1143 Type 2 diabetes mellitus with diabetic autonomic (poly)neuropathy: Secondary | ICD-10-CM | POA: Diagnosis not present

## 2016-09-16 LAB — BASIC METABOLIC PANEL WITH GFR
BUN: 17 mg/dL (ref 7–25)
CO2: 28 mmol/L (ref 20–31)
Calcium: 9.4 mg/dL (ref 8.6–10.3)
Chloride: 104 mmol/L (ref 98–110)
Creat: 0.91 mg/dL (ref 0.70–1.18)
GFR, Est African American: >89 mL/min (ref >=60)
GFR, Est Non African American: 83 mL/min (ref >=60)
Glucose, Bld: 250 mg/dL — ABNORMAL HIGH (ref 65–99)
Potassium: 4 mmol/L (ref 3.5–5.3)
Sodium: 141 mmol/L (ref 135–146)

## 2016-09-16 LAB — LIPID PANEL
Cholesterol: 92 mg/dL — ABNORMAL LOW (ref 125–200)
HDL: 34 mg/dL — ABNORMAL LOW (ref >=40)
LDL Cholesterol: 27 mg/dL (ref <130)
Total CHOL/HDL Ratio: 2.7 Ratio (ref <=5.0)
Triglycerides: 154 mg/dL — ABNORMAL HIGH (ref <150)
VLDL: 31 mg/dL — ABNORMAL HIGH (ref <30)

## 2016-09-16 LAB — ALT: ALT: 15 U/L (ref 9–46)

## 2016-09-16 NOTE — Progress Notes (Addendum)
Patient ID: Seth [name], male   DOB: 30-Mar-1941, 75 y.o.   MRN: [id number]    Location:  [facility] Place of Service: OFFICE  Chief Complaint  Patient presents with  . Medical Management of Chronic Issues    3 month routine visit  . Flu Vaccine    requested    HPI:  75 yo male seen today for f/u. He continues to have right ankle pain. He saw podiatry and was placed in a boot for several weeks. He requests handicap placard  DM - BS elevated at home 180-190s and occasionally as high as 200s. No low BS reactions. He reports no numbness or tingling. He is on toujeo 26 units daily, metformin, glipizide. A1c 9.8%. Urine microalbumin/Cr ratio 445.6 (prev. 504.0)  CAD/ICM/HTN/USA/HF - stable on ASA, coumadin, lipitor, coreg, lasix with KCL, lisinopril. No CP or SOB. Followed by cardio. LDL 29.  nephro Dr Mercy Moore added amlodipine 79m daily. He experienced dizziness upon standing and dose reduced to 1/2 tab daily. He still has dizziness upon standing. No swelling in legs  PAD - stable on ASA and coumadin. INR 3.2  afib - rate controlled on coreg. On coumadin with last INR 3.2. Followed by cardio  CKD - stable with nml Cr 0.97. Followed by nephrology Dr MMercy Moore Hx prostate CA - stable. Followed by urology  Hyperlipidemia - stable on lipitor. LDL 35. No myalgias  Anemia - stable. Hgb 13.8    Past Medical History:  Diagnosis Date  . Atrial fibrillation (HCC)    a. coumadin;  b. Amiodarone  . CAD (coronary artery disease)    a.  Lexiscan Myoview (02/14/14):  Apical, inferolateral scar; ? Mild ischemia; EF 32%.;  b.  LHC (02/21/14):  dLM 100%, LAD 100%, CFX 100%, dRCA 50%. => CABG (L-LAD, S-OM, S-PDA)  . Chronic systolic heart failure ([place]   . CKD (chronic kidney disease)    proteinuria  . Hemorrhage of gastrointestinal tract, unspecified   . Hemorrhage of rectum and anus   . HLD (hyperlipidemia)   . HTN (hypertension)   . Impotence of organic origin   . Ischemic cardiomyopathy    a. Echo (02/17/14):  EF 35-40%, anteroseptal and apical AK, Gr 2 DD, MAC, mod LAE.  .Marland KitchenNephrolithiasis   . Peripheral vascular disease, unspecified ([place]   . Prostate cancer ([place]   . Thrombocytopenia, unspecified ([place]   . Tobacco use disorder   . Type II or unspecified type diabetes mellitus with renal manifestations, uncontrolled     Past Surgical History:  Procedure Laterality Date  . BACK SURGERY    . CATARACT EXTRACTION  2010  . CLIPPING OF ATRIAL APPENDAGE Left 02/24/2014   Procedure: CLIPPING OF ATRIAL APPENDAGE;  Surgeon: BGaye Pollack MD;  [facility]  Service: Open Heart Surgery;  Laterality: Left;  . COLONOSCOPY  2005   Normal  . CORONARY ARTERY BYPASS GRAFT N/A 02/24/2014   Procedure: Coronary artery bypass graft times three using left internal mammary artery and left leg greater saphenous vein harvested endoscopically.;  Surgeon: BGaye Pollack MD;  [facility];  Service: Open Heart Surgery;  Laterality: N/A;  . INTRAOPERATIVE TRANSESOPHAGEAL ECHOCARDIOGRAM N/A 02/24/2014   Procedure: INTRAOPERATIVE TRANSESOPHAGEAL ECHOCARDIOGRAM;  Surgeon: BGaye Pollack MD;  [facility];  Service: Open Heart Surgery;  Laterality: N/A;  . LEFT HEART CATHETERIZATION WITH CORONARY ANGIOGRAM N/A 02/21/2014   Procedure: LEFT HEART CATHETERIZATION WITH CORONARY ANGIOGRAM;  Surgeon: Peter M JMartinique MD;  [facility];  Service: Cardiovascular;  Laterality: N/A;    Patient Care Team: Gildardo Cranker, DO as PCP - General (Internal Medicine) [name], MD as Attending Physician (Urology) [name], MD as Consulting Physician (Nephrology)  Social History   Social History  . Marital status: Married    Spouse name: N/A  . Number of children: 3  . Years of education: N/A   Occupational History  . Sell storage buildings    Social History Main Topics  . Smoking status: Never Smoker  . Smokeless tobacco: Former Systems developer    Types: Chew    Quit date: 02/23/2014  . Alcohol  use No  . Drug use: No  . Sexual activity: Not on file   Other Topics Concern  . Not on file   Social History Narrative  . No narrative on file     reports that he has never smoked. He quit smokeless tobacco use about 2 years ago. His smokeless tobacco use included Chew. He reports that he does not drink alcohol or use drugs.  Family History  Problem Relation Age of Onset  . Cancer Mother 31    leukemia  . Heart disease Father   . Heart attack Father 76   Family Status  Relation Status  . Mother Deceased  . Father Deceased at age 72   Heart attack   . Sister Deceased   unknown  . Brother Alive  . Daughter Alive  . Daughter Alive  . Daughter Alive     No Known Allergies  Medications: Patient's Medications  New Prescriptions   No medications on file  Previous Medications   ASPIRIN EC 81 MG TABLET    Take 1 tablet (81 mg total) by mouth daily.   ATORVASTATIN (LIPITOR) 40 MG TABLET    TAKE 1 TABLET BY MOUTH DAILY.   CALCIUM CARBONATE-VITAMIN D 600-200 MG-UNIT TABS    Take 1 tablet by mouth daily. For bones    CARVEDILOL (COREG) 12.5 MG TABLET    Take 25 mg by mouth 2 (two) times daily with a meal.    FLAXSEED, LINSEED, (FLAXSEED OIL PO)    Take 1 tablet by mouth daily.    FUROSEMIDE (LASIX) 40 MG TABLET    TAKE 2 TABLETS BY MOUTH EVERY MORNING AND 1TAB EVERY EVENING   GLIPIZIDE (GLUCOTROL) 5 MG TABLET    Take two tablets by mouth in the morning and take one tablet by mouth in the evening to control blood sugar.   INSULIN GLARGINE (TOUJEO SOLOSTAR) 300 UNIT/ML SOPN    Inject 20 Units into the skin daily.   JANTOVEN 5 MG TABLET    TAKE 1 TABLET BY MOUTH DAILY OR AS DIRECTED BY COUMADIN CLINIC   LISINOPRIL (PRINIVIL,ZESTRIL) 20 MG TABLET    Take 20 mg by mouth. 2 tablets by mouth in the am, 2 tablets by mouth in the pm per kidney doctor   METFORMIN (GLUCOPHAGE) 1000 MG TABLET    Take one tablet by mouth twice daily for diabetes   MULTIPLE VITAMIN (MULTIVITAMINS PO)     Take 1 tablet by mouth daily.     POTASSIUM CHLORIDE SA (K-DUR,KLOR-CON) 20 MEQ TABLET    Take 1 tablet (20 mEq total) by mouth daily.  Modified Medications   No medications on file  Discontinued Medications   FUROSEMIDE (LASIX) 40 MG TABLET    Take 2 tablets by mouth in the morning and 1 tablet by mouth in the evening    Review of Systems  Genitourinary: Positive for frequency (due to diuretic).  Musculoskeletal: Positive for arthralgias, gait problem and joint swelling.  Neurological: Positive for dizziness.  All other systems reviewed and are negative.   Vitals:   09/16/16 0916  BP: 138/76  Pulse: 65  Temp: 97.6 F (36.4 C)  TempSrc: Oral  SpO2: 95%  Weight: 254 lb 3.2 oz (115.3 kg)  Height: _0  (1.778 m)   Body mass index is 36.47 kg/m.  Physical Exam  Constitutional: He is oriented to person, place, and time. He appears well-developed and well-nourished.  HENT:  Mouth/Throat: Oropharynx is clear and moist. No oropharyngeal exudate.  Eyes: Pupils are equal, round, and reactive to light. No scleral icterus.  Neck: Neck supple. Carotid bruit is not present.  Cardiovascular: Regular rhythm and intact distal pulses.  Bradycardia present.  Exam reveals no gallop and no friction rub.   Murmur (1/6 SEM) heard. Trace b/l LE swelling. No calf TTP  Pulmonary/Chest: Effort normal and breath sounds normal. He has no wheezes. He has no rales. He exhibits no tenderness.  Abdominal: Soft. Bowel sounds are normal. He exhibits no distension, no abdominal bruit, no pulsatile midline mass and no mass. There is no tenderness. There is no rebound and no guarding.  obese  Musculoskeletal: He exhibits edema and tenderness.  Right ankle reduced ROM with swelling  Lymphadenopathy:    He has no cervical adenopathy.  Neurological: He is alert and oriented to person, place, and time.  Skin: Skin is warm and dry.  Psychiatric: He has a normal mood and affect. His behavior is normal. Judgment  and thought content normal.   Diabetic Foot Exam - Simple   Simple Foot Form Diabetic Foot exam was performed with the following findings:  Yes 09/16/2016 10:08 AM  Visual Inspection See comments:  Yes Sensation Testing See comments:  Yes Pulse Check Posterior Tibialis and Dorsalis pulse intact bilaterally:  Yes Comments Reduced monofilament testing b/l. Plantar dryness and flaking skin b/l. Heels soft with distal achilles tendom knot but no TTP or calluses       Labs reviewed: Anti-coag visit on 08/31/2016  Component Date Value Ref Range Status  . INR 08/31/2016 3.2   Final  Anti-coag visit on 07/21/2016  Component Date Value Ref Range Status  . INR 07/21/2016 2.1   Final    No results found.   Assessment/Plan   ICD-9-CM ICD-10-CM   1. Essential hypertension 401.9 I10   2. Type 2 diabetes mellitus with diabetic autonomic neuropathy, with long-term current use of insulin (HCC) 250.60 E11.43 BMP with eGFR   337.1 Z79.4 ALT   V58.67  Hemoglobin A1c  3. Right ankle pain 719.47 M25.571   4. Encounter for immunization Z23 Z23 Flu Vaccine QUAD 36+ mos IM     amLODipine (NORVASC) 5 MG tablet  5. Atrial fibrillation, unspecified 427.31 I48.91   6. Chronic systolic congestive heart failure (HCC) 428.22 I50.22    428.0    7. Hyperlipidemia 272.4 E78.5 Lipid Panel  8.      Dizziness - probable orthostatic hypotension  Continue current medications as ordered  Change positions slowly  Follow up with specialists as scheduled  Flu shot given today  Will call with lab results  Handicap placard application completed  Follow up in 3 mos for routine visit  Eman Morimoto S. Perlie Gold  [facility] [address] [place], [place] [address] [phone] Cell (Monday-Friday 8 AM - 5 PM) (  [phone] After 5 PM and follow prompts

## 2016-09-16 NOTE — Patient Instructions (Addendum)
Continue current medications as ordered  Follow up with specialists as scheduled  Flu shot given today  Will call with lab results  Follow up in 3 mos for routine visit

## 2016-09-17 LAB — HEMOGLOBIN A1C
Hgb A1c MFr Bld: 9.5 % — ABNORMAL HIGH (ref <5.7)
Mean Plasma Glucose: 226 mg/dL

## 2016-09-19 ENCOUNTER — Telehealth: Payer: Self-pay

## 2016-09-19 DIAGNOSIS — E1159 Type 2 diabetes mellitus with other circulatory complications: Secondary | ICD-10-CM

## 2016-09-19 MED ORDER — INSULIN GLARGINE 300 UNIT/ML ~~LOC~~ SOPN
30.0000 [IU] | PEN_INJECTOR | Freq: Every day | SUBCUTANEOUS | 1 refills | Status: DC
Start: 2016-09-19 — End: 2016-12-20

## 2016-09-19 NOTE — Telephone Encounter (Signed)
Discussed results with patient, patient verbalized understanding of results. RX sent to the pharmacy with an increased dose for toujeo.  Copy of labs mailed to patient in letter form

## 2016-09-19 NOTE — Telephone Encounter (Signed)
-----   Message from [place], [place] sent at 09/18/2016  5:56 PM EDT ----- Blood sugar improved but still uncontrolled - increase toujeo to 30 units daily. Cholesterol panel is stable. Other labs stable. Follow up as scheduled

## 2016-09-23 ENCOUNTER — Other Ambulatory Visit: Payer: Self-pay | Admitting: Cardiovascular Disease

## 2016-09-26 ENCOUNTER — Other Ambulatory Visit: Payer: Self-pay | Admitting: *Deleted

## 2016-09-26 DIAGNOSIS — E1143 Type 2 diabetes mellitus with diabetic autonomic (poly)neuropathy: Secondary | ICD-10-CM

## 2016-09-26 MED ORDER — ATORVASTATIN CALCIUM 40 MG PO TABS: 40.0000 mg | ORAL_TABLET | Freq: Every day | ORAL | 3 refills | Status: DC

## 2016-09-26 MED ORDER — GLIPIZIDE 5 MG PO TABS: ORAL_TABLET | ORAL | 1 refills | Status: DC

## 2016-09-26 NOTE — Telephone Encounter (Signed)
[facility]

## 2016-10-05 ENCOUNTER — Ambulatory Visit (INDEPENDENT_AMBULATORY_CARE_PROVIDER_SITE_OTHER): Payer: Medicare Other | Admitting: *Deleted

## 2016-10-05 DIAGNOSIS — Z5181 Encounter for therapeutic drug level monitoring: Secondary | ICD-10-CM

## 2016-10-05 DIAGNOSIS — I4891 Unspecified atrial fibrillation: Secondary | ICD-10-CM

## 2016-10-05 LAB — POCT INR: INR: 2.8

## 2016-11-07 DIAGNOSIS — R809 Proteinuria, unspecified: Secondary | ICD-10-CM | POA: Diagnosis not present

## 2016-11-07 DIAGNOSIS — I10 Essential (primary) hypertension: Secondary | ICD-10-CM | POA: Diagnosis not present

## 2016-11-07 DIAGNOSIS — Z6841 Body Mass Index (BMI) 40.0 and over, adult: Secondary | ICD-10-CM | POA: Diagnosis not present

## 2016-11-09 ENCOUNTER — Ambulatory Visit (INDEPENDENT_AMBULATORY_CARE_PROVIDER_SITE_OTHER): Payer: Medicare Other | Admitting: *Deleted

## 2016-11-09 DIAGNOSIS — I2510 Atherosclerotic heart disease of native coronary artery without angina pectoris: Secondary | ICD-10-CM

## 2016-11-09 DIAGNOSIS — Z5181 Encounter for therapeutic drug level monitoring: Secondary | ICD-10-CM | POA: Diagnosis not present

## 2016-11-09 DIAGNOSIS — I4891 Unspecified atrial fibrillation: Secondary | ICD-10-CM | POA: Diagnosis not present

## 2016-11-09 LAB — POCT INR: INR: 2

## 2016-12-16 ENCOUNTER — Ambulatory Visit (INDEPENDENT_AMBULATORY_CARE_PROVIDER_SITE_OTHER): Payer: Medicare Other

## 2016-12-16 ENCOUNTER — Ambulatory Visit (INDEPENDENT_AMBULATORY_CARE_PROVIDER_SITE_OTHER): Payer: Medicare Other | Admitting: Internal Medicine

## 2016-12-16 ENCOUNTER — Encounter: Payer: Self-pay | Admitting: Internal Medicine

## 2016-12-16 VITALS — BP 142/66 | HR 74 | Temp 98.1°F | Ht 70.0 in | Wt 258.0 lb

## 2016-12-16 DIAGNOSIS — E782 Mixed hyperlipidemia: Secondary | ICD-10-CM

## 2016-12-16 DIAGNOSIS — I739 Peripheral vascular disease, unspecified: Secondary | ICD-10-CM

## 2016-12-16 DIAGNOSIS — Z0000 Encounter for general adult medical examination without abnormal findings: Secondary | ICD-10-CM

## 2016-12-16 DIAGNOSIS — E1143 Type 2 diabetes mellitus with diabetic autonomic (poly)neuropathy: Secondary | ICD-10-CM

## 2016-12-16 DIAGNOSIS — I480 Paroxysmal atrial fibrillation: Secondary | ICD-10-CM

## 2016-12-16 DIAGNOSIS — I2510 Atherosclerotic heart disease of native coronary artery without angina pectoris: Secondary | ICD-10-CM

## 2016-12-16 DIAGNOSIS — I10 Essential (primary) hypertension: Secondary | ICD-10-CM

## 2016-12-16 DIAGNOSIS — Z794 Long term (current) use of insulin: Secondary | ICD-10-CM | POA: Diagnosis not present

## 2016-12-16 DIAGNOSIS — I5022 Chronic systolic (congestive) heart failure: Secondary | ICD-10-CM | POA: Diagnosis not present

## 2016-12-16 LAB — COMPLETE METABOLIC PANEL WITH GFR
ALT: 17 U/L (ref 9–46)
AST: 15 U/L (ref 10–35)
Albumin: 3.6 g/dL (ref 3.6–5.1)
Alkaline Phosphatase: 55 U/L (ref 40–115)
BUN: 19 mg/dL (ref 7–25)
CO2: 25 mmol/L (ref 20–31)
Calcium: 9.1 mg/dL (ref 8.6–10.3)
Chloride: 105 mmol/L (ref 98–110)
Creat: 0.88 mg/dL (ref 0.70–1.18)
GFR, Est African American: >89 mL/min (ref >=60)
GFR, Est Non African American: 85 mL/min (ref >=60)
Glucose, Bld: 277 mg/dL — ABNORMAL HIGH (ref 65–99)
Potassium: 4.3 mmol/L (ref 3.5–5.3)
Sodium: 140 mmol/L (ref 135–146)
Total Bilirubin: 0.7 mg/dL (ref 0.2–1.2)
Total Protein: 6.6 g/dL (ref 6.1–8.1)

## 2016-12-16 LAB — LIPID PANEL
Cholesterol: 102 mg/dL (ref <200)
HDL: 34 mg/dL — ABNORMAL LOW (ref >40)
LDL Cholesterol: 39 mg/dL (ref <100)
Total CHOL/HDL Ratio: 3 Ratio (ref <5.0)
Triglycerides: 145 mg/dL (ref <150)
VLDL: 29 mg/dL (ref <30)

## 2016-12-16 LAB — HEMOGLOBIN A1C
Hgb A1c MFr Bld: 9.6 % — ABNORMAL HIGH (ref <5.7)
Mean Plasma Glucose: 229 mg/dL

## 2016-12-16 NOTE — Progress Notes (Signed)
Subjective:   Seth [name] is a 75 y.o. male who presents for an Initial Medicare Annual Wellness Visit.  Review of Systems  Cardiac Risk Factors include: advanced age (>61men, >51 women);diabetes mellitus;hypertension;male gender;obesity (BMI >30kg/m2);sedentary lifestyle;family history of premature cardiovascular disease    Objective:    Today's Vitals   12/16/16 0838  BP: (!) 142/66  Pulse: 74  Temp: 98.1 F (36.7 C)  TempSrc: Oral  SpO2: 97%  Weight: 258 lb (117 kg)  Height: 5\' 10"  (1.778 m)  PainSc: 0-No pain   Body mass index is 37.02 kg/m.  Current Medications (verified) Outpatient Encounter Prescriptions as of 12/16/2016  Medication Sig  . amLODipine (NORVASC) 5 MG tablet Take 2.5 mg by mouth daily.  [name] aspirin EC 81 MG tablet Take 1 tablet (81 mg total) by mouth daily.  [name] atorvastatin (LIPITOR) 40 MG tablet Take 1 tablet (40 mg total) by mouth daily.  . Calcium Carbonate-Vitamin D 600-200 MG-UNIT TABS Take 1 tablet by mouth daily. For bones   . carvedilol (COREG) 12.5 MG tablet Take 25 mg by mouth 2 (two) times daily with a meal.   . Flaxseed, Linseed, (FLAXSEED OIL PO) Take 1 tablet by mouth daily.   . furosemide (LASIX) 40 MG tablet TAKE 2 TABLETS BY MOUTH EVERY MORNING AND 1TAB EVERY EVENING  . glipiZIDE (GLUCOTROL) 5 MG tablet Take two tablets by mouth in the morning and take one tablet by mouth in the evening to control blood sugar.  . Insulin Glargine (TOUJEO SOLOSTAR) 300 UNIT/ML SOPN Inject 30 Units into the skin daily.  [name] JANTOVEN 5 MG tablet TAKE 1 TABLET BY MOUTH DAILY OR AS DIRECTED BY COUMADIN CLINIC  . lisinopril (PRINIVIL,ZESTRIL) 20 MG tablet Take 20 mg by mouth. 2 tablets by mouth in the am, 2 tablets by mouth in the pm per kidney doctor  . metFORMIN (GLUCOPHAGE) 1000 MG tablet Take one tablet by mouth twice daily for diabetes  . Multiple Vitamin (MULTIVITAMINS PO) Take 1 tablet by mouth daily.    . potassium chloride SA (K-DUR,KLOR-CON) 20 MEQ  tablet Take 1 tablet (20 mEq total) by mouth daily.   No facility-administered encounter medications on file as of 12/16/2016.     Allergies (verified) Patient has no known allergies.   History: Past Medical History:  Diagnosis Date  . Atrial fibrillation (HCC)    a. coumadin;  b. Amiodarone  . CAD (coronary artery disease)    a.  Lexiscan Myoview (02/14/14):  Apical, inferolateral scar; ? Mild ischemia; EF 32%.;  b.  LHC (02/21/14):  dLM 100%, LAD 100%, CFX 100%, dRCA 50%. => CABG (L-LAD, S-OM, S-PDA)  . Chronic systolic heart failure ([place])   . CKD (chronic kidney disease)    proteinuria  . Hemorrhage of gastrointestinal tract, unspecified   . Hemorrhage of rectum and anus   . HLD (hyperlipidemia)   . HTN (hypertension)   . Impotence of organic origin   . Ischemic cardiomyopathy    a. Echo (02/17/14):  EF 35-40%, anteroseptal and apical AK, Gr 2 DD, MAC, mod LAE.  [name] Nephrolithiasis   . Peripheral vascular disease, unspecified   . Prostate cancer ([place])   . Thrombocytopenia, unspecified   . Tobacco use disorder   . Type II or unspecified type diabetes mellitus with renal manifestations, uncontrolled(250.42)    Past Surgical History:  Procedure Laterality Date  . BACK SURGERY    . CATARACT EXTRACTION  2010  . CLIPPING OF ATRIAL APPENDAGE Left 02/24/2014  Procedure: CLIPPING OF ATRIAL APPENDAGE;  Surgeon: Gaye Pollack, MD;  Location: [place];  Service: Open Heart Surgery;  Laterality: Left;  . COLONOSCOPY  2005   Normal  . CORONARY ARTERY BYPASS GRAFT N/A 02/24/2014   Procedure: Coronary artery bypass graft times three using left internal mammary artery and left leg greater saphenous vein harvested endoscopically.;  Surgeon: Gaye Pollack, MD;  Location[facility];  Service: Open Heart Surgery;  Laterality: N/A;  . INTRAOPERATIVE TRANSESOPHAGEAL ECHOCARDIOGRAM N/A 02/24/2014   Procedure: INTRAOPERATIVE TRANSESOPHAGEAL ECHOCARDIOGRAM;  Surgeon: Gaye Pollack, MD;  Location: [place] OR;   Service: Open Heart Surgery;  Laterality: N/A;  . LEFT HEART CATHETERIZATION WITH CORONARY ANGIOGRAM N/A 02/21/2014   Procedure: LEFT HEART CATHETERIZATION WITH CORONARY ANGIOGRAM;  Surgeon: Peter M Martinique, MD;  Location: [facility] CATH LAB;  Service: Cardiovascular;  Laterality: N/A;   Family History  Problem Relation Age of Onset  . Cancer Mother 37    leukemia  . Heart disease Father   . Heart attack Father 58  . Lung cancer Brother    Social History   Occupational History  . Sell storage buildings    Social History Main Topics  . Smoking status: Never Smoker  . Smokeless tobacco: Former Systems developer    Types: Chew    Quit date: 02/23/2014  . Alcohol use No  . Drug use: No  . Sexual activity: No   Tobacco Counseling Counseling given: No   Activities of Daily Living In your present state of health, do you have any difficulty performing the following activities: 12/16/2016  Hearing? Y  Vision? Y  Difficulty concentrating or making decisions? Y  Walking or climbing stairs? Y  Dressing or bathing? N  Doing errands, shopping? N  Preparing Food and eating ? N  Using the Toilet? N  In the past six months, have you accidently leaked urine? Y  Do you have problems with loss of bowel control? N  Managing your Medications? N  Managing your Finances? N  Housekeeping or managing your Housekeeping? N  Some recent data might be hidden    Immunizations and Health Maintenance Immunization History  Administered Date(s) Administered  . Influenza,inj,Quad PF,36+ Mos 08/27/2013, 09/30/2014, 10/02/2015, 09/16/2016  . Influenza-Unspecified 09/25/2012  . Pneumococcal Conjugate-13 06/10/2016  . Pneumococcal Polysaccharide-23 12/09/2010  . Td 12/26/2010   Health Maintenance Due  Topic Date Due  . OPHTHALMOLOGY EXAM  10/25/2015    Patient Care Team: Gildardo Cranker, DO as PCP - General (Internal Medicine) [name], MD as Attending Physician (Urology) [name], MD as Consulting  Physician (Nephrology)  Indicate any recent Medical Services you may have received from other than [facility] providers in the past year (date may be approximate).    Assessment:   This is a routine wellness examination for [place].   Hearing/Vision screen  Hearing Screening   125Hz  250Hz  500Hz  1000Hz  2000Hz  3000Hz  4000Hz  6000Hz  8000Hz   Right ear:   100 100 0  0    Left ear:   100 100 0  0    Comments: Pt is due for a hearing screen; Failed office hearing screen.   Vision Screening Comments: Last eye exam done 01/18/16, at [facility] off [address].   Dietary issues and exercise activities discussed: Current Exercise Habits: The patient does not participate in regular exercise at present  Goals    . Weight (lb) < 240 lb (108.9 kg)          Starting 12/16/16, I will attempt  to decrease my weight to 240lbs.       Depression Screen PHQ 2/9 Scores 12/16/2016 09/16/2016 08/04/2015 09/30/2014  PHQ - 2 Score 0 0 0 0    Fall Risk Fall Risk  12/16/2016 09/16/2016 06/10/2016 03/09/2016 01/18/2016  Falls in the past year? No No No No No    Cognitive Function: MMSE - Mini Mental State Exam 12/16/2016  Orientation to time 5  Orientation to Place 5  Registration 3  Attention/ Calculation 5  Recall 1  Language- name 2 objects 2  Language- repeat 1  Language- follow 3 step command 3  Language- read & follow direction 1  Write a sentence 1  Copy design 1  Total score 28        Screening Tests Health Maintenance  Topic Date Due  . OPHTHALMOLOGY EXAM  10/25/2015  . ZOSTAVAX  06/12/2017 (Originally 06/26/2002)  . HEMOGLOBIN A1C  03/16/2017  . FOOT EXAM  09/16/2017  . COLONOSCOPY  06/02/2020  . TETANUS/TDAP  12/26/2020  . INFLUENZA VACCINE  Completed  . PNA vac Low Risk Adult  Completed        Plan:    I have personally reviewed and addressed the Medicare Annual Wellness questionnaire and have noted the following in the patient's chart:  A. Medical and social history B. Use of  alcohol, tobacco or illicit drugs  C. Current medications and supplements D. Functional ability and status E.  Nutritional status F.  Physical activity G. Advance directives H. List of other physicians I.  Hospitalizations, surgeries, and ER visits in previous 12 months J.  [place] to include hearing, vision, cognitive, depression L. Referrals and appointments - none  In addition, I have reviewed and discussed with patient certain preventive protocols, quality metrics, and best practice recommendations. A written personalized care plan for preventive services as well as general preventive health recommendations were provided to patient.  See attached scanned questionnaire for additional information.   Signed,   Allyn Kenner, [place]. Perlie Gold  [facility] [address] [place], [place] [address] [phone] Cell (Monday-Friday 8 AM - 5 PM) [phone] After 5 PM and follow prompts

## 2016-12-16 NOTE — Patient Instructions (Addendum)
Seth [name] , Thank you for taking time to come for your Medicare Wellness Visit. I appreciate your ongoing commitment to your health goals. Please review the following plan we discussed and let me know if I can assist you in the future.   These are the goals we discussed: Goals    . Weight (lb) < 240 lb (108.9 kg)          Starting 12/16/16, I will attempt to decrease my weight to 240lbs.        This is a list of the screening recommended for you and due dates:  Health Maintenance  Topic Date Due  . Shingles Vaccine  06/12/2017*  . Eye exam for diabetics  01/17/2017  . Hemoglobin A1C  03/16/2017  . Complete foot exam   09/16/2017  . Colon Cancer Screening  06/02/2020  . Tetanus Vaccine  12/26/2020  . Flu Shot  Completed  . Pneumonia vaccines  Completed  *Topic was postponed. The date shown is not the original due date.  Preventive Care for Adults  A healthy lifestyle and preventive care can promote health and wellness. Preventive health guidelines for adults include the following key practices.  . A routine yearly physical is a good way to check with your health care provider about your health and preventive screening. It is a chance to share any concerns and updates on your health and to receive a thorough exam.  . Visit your dentist for a routine exam and preventive care every 6 months. Brush your teeth twice a day and floss once a day. Good oral hygiene prevents tooth decay and gum disease.  . The frequency of eye exams is based on your age, health, family medical history, use  of contact lenses, and other factors. Follow your health care provider's ecommendations for frequency of eye exams.  . Eat a healthy diet. Foods like vegetables, fruits, whole grains, low-fat dairy products, and lean protein foods contain the nutrients you need without too many calories. Decrease your intake of foods high in solid fats, added sugars, and salt. Eat the right amount of calories for you. Get  information about a proper diet from your health care provider, if necessary.  . Regular physical exercise is one of the most important things you can do for your health. Most adults should get at least 150 minutes of moderate-intensity exercise (any activity that increases your heart rate and causes you to sweat) each week. In addition, most adults need muscle-strengthening exercises on 2 or more days a week.  Silver Sneakers may be a benefit available to you. To determine eligibility, you may visit the website: www.silversneakers.com or contact program at 702 177 8786 Mon-Fri between 8AM-8PM.   . Maintain a healthy weight. The body mass index (BMI) is a screening tool to identify possible weight problems. It provides an estimate of body fat based on height and weight. Your health care provider can find your BMI and can help you achieve or maintain a healthy weight.   For adults 20 years and older: ? A BMI below 18.5 is considered underweight. ? A BMI of 18.5 to 24.9 is normal. ? A BMI of 25 to 29.9 is considered overweight. ? A BMI of 30 and above is considered obese.   . Maintain normal blood lipids and cholesterol levels by exercising and minimizing your intake of saturated fat. Eat a balanced diet with plenty of fruit and vegetables. Blood tests for lipids and cholesterol should begin at age 66 and be  repeated every 5 years. If your lipid or cholesterol levels are high, you are over 50, or you are at high risk for heart disease, you may need your cholesterol levels checked more frequently. Ongoing high lipid and cholesterol levels should be treated with medicines if diet and exercise are not working.  . If you smoke, find out from your health care provider how to quit. If you do not use tobacco, please do not start.  . If you choose to drink alcohol, please do not consume more than 2 drinks per day. One drink is considered to be 12 ounces (355 mL) of beer, 5 ounces (148 mL) of wine, or 1.5  ounces (44 mL) of liquor.  . If you are 17-105 years old, ask your health care provider if you should take aspirin to prevent strokes.  . Use sunscreen. Apply sunscreen liberally and repeatedly throughout the day. You should seek shade when your shadow is shorter than you. Protect yourself by wearing long sleeves, pants, a wide-brimmed hat, and sunglasses year round, whenever you are outdoors.  . Once a month, do a whole body skin exam, using a mirror to look at the skin on your back. Tell your health care provider of new moles, moles that have irregular borders, moles that are larger than a pencil eraser, or moles that have changed in shape or color.

## 2016-12-16 NOTE — Progress Notes (Signed)
Quick Notes   Health Maintenance:   Up to date on health maintenance   Abnormal Screen:  None; MMSE-28/30 Passed Clock Test   Patient Concerns:  Pt has had rash all over his body, he will like to discuss.     Nurse Concerns:  None

## 2016-12-16 NOTE — Patient Instructions (Signed)
Recommend moisturizing lotion to skin at least 2 times daily  Continue current medications as ordered  Follow up with specialists as scheduled  Will call with lab results  Follow up in 3 mos for routine visit.

## 2016-12-16 NOTE — Progress Notes (Signed)
Patient ID: Seth [name], male   DOB: [birth year], 75 y.o.   MRN: [id number]    Location:  [facility] of Service: OFFICE  Chief Complaint  Patient presents with  . Medical Management of Chronic Issues    3 month routine visit  . Rash    rash located on all over body  . MMSE    28/30 passed clock drawing    HPI:  75 yo male seen today for f/u. He is c/a itchy red rash generalized that occurs yearly when he starts wearing long pants. He used an OTC "oil" about 2 yrs ago when it occurred which helped. No other concerns.  DM - BS elevated at home 140-150s.  No low BS reactions. He reports no numbness or tingling. He is on toujeo 30 units daily, metformin, glipizide. A1c 9.5%. Urine microalbumin/Cr ratio 445.6 (prev. 504.0)  CAD/ICM/HTN/USA/HF - stable on ASA, coumadin, lipitor, coreg, lasix with KCL, lisinopril. No CP or SOB. Followed by cardio. LDL 29.  nephro Dr Mercy Moore added amlodipine 30m daily. He experienced dizziness upon standing and dose reduced to 1/2 tab daily. He still has dizziness upon standing. No swelling in legs  PAD - stable on ASA and coumadin. INR 2.0  afib - rate controlled on coreg. On coumadin with last INR 2.0. Followed by cardio  CKD - stable with nml Cr 0.91. Followed by nephrology Dr MMercy Moore Hx prostate CA - stable. Followed by urology  Hyperlipidemia - stable on lipitor. LDL 27. No myalgias  Anemia - stable. Hgb 13.8  Past Medical History:  Diagnosis Date  . Atrial fibrillation (HCC)    a. coumadin;  b. Amiodarone  . CAD (coronary artery disease)    a.  Lexiscan Myoview (02/14/14):  Apical, inferolateral scar; ? Mild ischemia; EF 32%.;  b.  LHC (02/21/14):  dLM 100%, LAD 100%, CFX 100%, dRCA 50%. => CABG (L-LAD, S-OM, S-PDA)  . Chronic systolic heart failure ([place]   . CKD (chronic kidney disease)    proteinuria  . Hemorrhage of gastrointestinal tract, unspecified   . Hemorrhage of rectum and anus   . HLD (hyperlipidemia)   . HTN (hypertension)    . Impotence of organic origin   . Ischemic cardiomyopathy    a. Echo (02/17/14):  EF 35-40%, anteroseptal and apical AK, Gr 2 DD, MAC, mod LAE.  .Marland KitchenNephrolithiasis   . Peripheral vascular disease, unspecified   . Prostate cancer ([place]   . Thrombocytopenia, unspecified   . Tobacco use disorder   . 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 BACK SURGERY    . CATARACT EXTRACTION  2010  . CLIPPING OF ATRIAL APPENDAGE Left 02/24/2014   Procedure: CLIPPING OF ATRIAL APPENDAGE;  Surgeon: BGaye Pollack MD;  [place] [facility]  Service: Open Heart Surgery;  Laterality: Left;  . COLONOSCOPY  2005   Normal  . CORONARY ARTERY BYPASS GRAFT N/A 02/24/2014   Procedure: Coronary artery bypass graft times three using left internal mammary artery and left leg greater saphenous vein harvested endoscopically.;  Surgeon: BGaye Pollack MD;  [facility];  Service: Open Heart Surgery;  Laterality: N/A;  . INTRAOPERATIVE TRANSESOPHAGEAL ECHOCARDIOGRAM N/A 02/24/2014   Procedure: INTRAOPERATIVE TRANSESOPHAGEAL ECHOCARDIOGRAM;  Surgeon: BGaye Pollack MD;  [facility];  Service: Open Heart Surgery;  Laterality: N/A;  . LEFT HEART CATHETERIZATION WITH CORONARY ANGIOGRAM N/A 02/21/2014   Procedure:  LEFT HEART CATHETERIZATION WITH CORONARY ANGIOGRAM;  Surgeon: Peter M Martinique, MD;  Location: [facility] CATH LAB;  Service: Cardiovascular;  Laterality: N/A;    Patient Care Team: Gildardo Cranker, DO as PCP - General (Internal Medicine) [name], MD as Attending Physician (Urology) [name], MD as Consulting Physician (Nephrology)  Social History   Social History  . Marital status: Married    Spouse name: N/A  . Number of children: 3  . Years of education: N/A   Occupational History  . Sell storage buildings    Social History Main Topics  . Smoking status: Never Smoker  . Smokeless tobacco:  Former Systems developer    Types: Chew    Quit date: 02/23/2014  . Alcohol use No  . Drug use: No  . Sexual activity: No   Other Topics Concern  . Not on file   Social History Narrative  . No narrative on file     reports that he has never smoked. He quit smokeless tobacco use about 2 years ago. His smokeless tobacco use included Chew. He reports that he does not drink alcohol or use drugs.  Family History  Problem Relation Age of Onset  . Cancer Mother 54    leukemia  . Heart disease Father   . Heart attack Father 28  . Lung cancer Brother    Family Status  Relation Status  . Mother Deceased  . Father Deceased at age 86   Heart attack   . Sister Deceased   unknown  . Brother Deceased  . Daughter Alive  . Daughter Alive  . Daughter Alive     No Known Allergies  Medications: Patient's Medications  New Prescriptions   No medications on file  Previous Medications   AMLODIPINE (NORVASC) 5 MG TABLET    Take 2.5 mg by mouth daily.   ASPIRIN EC 81 MG TABLET    Take 1 tablet (81 mg total) by mouth daily.   ATORVASTATIN (LIPITOR) 40 MG TABLET    Take 1 tablet (40 mg total) by mouth daily.   CALCIUM CARBONATE-VITAMIN D 600-200 MG-UNIT TABS    Take 1 tablet by mouth daily. For bones    CARVEDILOL (COREG) 12.5 MG TABLET    Take 25 mg by mouth 2 (two) times daily with a meal.    FLAXSEED, LINSEED, (FLAXSEED OIL PO)    Take 1 tablet by mouth daily.    FUROSEMIDE (LASIX) 40 MG TABLET    TAKE 2 TABLETS BY MOUTH EVERY MORNING AND 1TAB EVERY EVENING   GLIPIZIDE (GLUCOTROL) 5 MG TABLET    Take two tablets by mouth in the morning and take one tablet by mouth in the evening to control blood sugar.   INSULIN GLARGINE (TOUJEO SOLOSTAR) 300 UNIT/ML SOPN    Inject 30 Units into the skin daily.   JANTOVEN 5 MG TABLET    TAKE 1 TABLET BY MOUTH DAILY OR AS DIRECTED BY COUMADIN CLINIC   LISINOPRIL (PRINIVIL,ZESTRIL) 20 MG TABLET    Take 20 mg by mouth. 2 tablets by mouth in the am, 2 tablets by mouth in  the pm per kidney doctor   METFORMIN (GLUCOPHAGE) 1000 MG TABLET    Take one tablet by mouth twice daily for diabetes   MULTIPLE VITAMIN (MULTIVITAMINS PO)    Take 1 tablet by mouth daily.     POTASSIUM CHLORIDE SA (K-DUR,KLOR-CON) 20 MEQ TABLET    Take 1 tablet (20 mEq total) by mouth daily.  Modified Medications  No medications on file  Discontinued Medications   No medications on file    Review of Systems  Vitals:   12/16/16 0845  BP: (!) 142/66  Pulse: 74  Temp: 98.1 F (36.7 C)  TempSrc: Oral  SpO2: 97%  Weight: 258 lb (117 kg)  Height: _0  (1.778 m)   Body mass index is 37.02 kg/m.  Physical Exam  Constitutional: He is oriented to person, place, and time. He appears well-developed and well-nourished.  HENT:  Mouth/Throat: Oropharynx is clear and moist. No oropharyngeal exudate.  Eyes: Pupils are equal, round, and reactive to light. No scleral icterus.  Neck: Neck supple. Carotid bruit is not present.  Cardiovascular: Regular rhythm and intact distal pulses.  Bradycardia present.  Exam reveals no gallop and no friction rub.   Murmur (1/6 SEM) heard. Trace b/l LE swelling. No calf TTP  Pulmonary/Chest: Effort normal and breath sounds normal. He has no wheezes. He has no rales. He exhibits no tenderness.  Abdominal: Soft. Bowel sounds are normal. He exhibits no distension, no abdominal bruit, no pulsatile midline mass and no mass. There is no tenderness. There is no rebound and no guarding.  obese  Musculoskeletal: He exhibits edema and tenderness.  Right ankle reduced ROM with swelling  Lymphadenopathy:    He has no cervical adenopathy.  Neurological: He is alert and oriented to person, place, and time.  Skin: Skin is warm and dry. Rash (patchy red dry; no vesicular formation, pustules or d/c) noted.  Psychiatric: He has a normal mood and affect. His behavior is normal. Judgment and thought content normal.     Labs reviewed: Anti-coag visit on 11/09/2016    Component Date Value Ref Range Status  . INR 11/09/2016 2.0   Final  Anti-coag visit on 10/05/2016  Component Date Value Ref Range Status  . INR 10/05/2016 2.8   Final    No results found.   Assessment/Plan   ICD-9-CM ICD-10-CM   1. Type 2 diabetes mellitus with diabetic autonomic neuropathy, with long-term current use of insulin (HCC) 250.60 E11.43 CMP with eGFR   337.1 Z79.4 Hemoglobin A1c   V58.67    2. Essential hypertension 401.9 I10   3. Chronic systolic congestive heart failure (HCC) 428.22 I50.22    428.0    4. Mixed hyperlipidemia 272.2 E78.2 Lipid Panel  5. PVD (peripheral vascular disease) (HCC) 443.9 I73.9   6. Paroxysmal atrial fibrillation (HCC) 427.31 I48.0      He declined steroid injection for rash  Recommend moisturizing lotion to skin at least 2 times daily  Continue current medications as ordered  Follow up with specialists as scheduled  Will call with lab results  Follow up in 3 mos for routine visit.  Krystyn Picking S. Perlie Gold  [facility] [address] [place], [place] [address] [phone] Cell (Monday-Friday 8 AM - 5 PM) [phone] After 5 PM and follow prompts

## 2016-12-20 NOTE — Addendum Note (Signed)
Addended by: Denyse Amass on: 12/20/2016 10:58 AM   Modules accepted: Orders

## 2016-12-27 ENCOUNTER — Ambulatory Visit (INDEPENDENT_AMBULATORY_CARE_PROVIDER_SITE_OTHER): Payer: Medicare Other | Admitting: *Deleted

## 2016-12-27 ENCOUNTER — Other Ambulatory Visit: Payer: Self-pay | Admitting: Cardiovascular Disease

## 2016-12-27 DIAGNOSIS — I4891 Unspecified atrial fibrillation: Secondary | ICD-10-CM | POA: Diagnosis not present

## 2016-12-27 DIAGNOSIS — Z5181 Encounter for therapeutic drug level monitoring: Secondary | ICD-10-CM

## 2016-12-27 LAB — POCT INR: INR: 2.2

## 2017-01-05 ENCOUNTER — Other Ambulatory Visit: Payer: Self-pay | Admitting: *Deleted

## 2017-01-05 MED ORDER — METFORMIN HCL 1000 MG PO TABS: ORAL_TABLET | ORAL | 5 refills | Status: DC

## 2017-01-05 NOTE — Telephone Encounter (Signed)
Patient requested to be faxed to [facility].

## 2017-01-19 ENCOUNTER — Telehealth: Payer: Self-pay

## 2017-01-19 NOTE — Telephone Encounter (Signed)
Fax was received from [facility] for diabetic supplies.  I called patient to confirm that he requested supplies from this company, patient replied- yes  Form filled out and placed in Dr.Carter's review and sign folder

## 2017-01-20 ENCOUNTER — Other Ambulatory Visit: Payer: Self-pay | Admitting: *Deleted

## 2017-01-20 MED ORDER — INSULIN GLARGINE 300 UNIT/ML ~~LOC~~ SOPN: 36.0000 [IU] | PEN_INJECTOR | Freq: Every day | SUBCUTANEOUS | 5 refills | Status: DC

## 2017-01-20 NOTE — Telephone Encounter (Signed)
Patient requested refill to be faxed to [facility]

## 2017-02-07 ENCOUNTER — Ambulatory Visit (INDEPENDENT_AMBULATORY_CARE_PROVIDER_SITE_OTHER): Payer: Medicare Other | Admitting: *Deleted

## 2017-02-07 DIAGNOSIS — I4891 Unspecified atrial fibrillation: Secondary | ICD-10-CM | POA: Diagnosis not present

## 2017-02-07 DIAGNOSIS — Z6841 Body Mass Index (BMI) 40.0 and over, adult: Secondary | ICD-10-CM | POA: Diagnosis not present

## 2017-02-07 DIAGNOSIS — Z5181 Encounter for therapeutic drug level monitoring: Secondary | ICD-10-CM | POA: Diagnosis not present

## 2017-02-07 DIAGNOSIS — I10 Essential (primary) hypertension: Secondary | ICD-10-CM | POA: Diagnosis not present

## 2017-02-07 DIAGNOSIS — R809 Proteinuria, unspecified: Secondary | ICD-10-CM | POA: Diagnosis not present

## 2017-02-07 LAB — POCT INR: INR: 1.7

## 2017-02-21 ENCOUNTER — Ambulatory Visit (INDEPENDENT_AMBULATORY_CARE_PROVIDER_SITE_OTHER): Payer: Medicare Other | Admitting: *Deleted

## 2017-02-21 ENCOUNTER — Other Ambulatory Visit: Payer: Self-pay | Admitting: Internal Medicine

## 2017-02-21 DIAGNOSIS — I4891 Unspecified atrial fibrillation: Secondary | ICD-10-CM | POA: Diagnosis not present

## 2017-02-21 DIAGNOSIS — Z5181 Encounter for therapeutic drug level monitoring: Secondary | ICD-10-CM

## 2017-02-21 LAB — POCT INR: INR: 2.3

## 2017-02-27 LAB — HM DIABETES EYE EXAM

## 2017-03-20 DIAGNOSIS — Z8546 Personal history of malignant neoplasm of prostate: Secondary | ICD-10-CM | POA: Diagnosis not present

## 2017-03-21 ENCOUNTER — Ambulatory Visit (INDEPENDENT_AMBULATORY_CARE_PROVIDER_SITE_OTHER): Payer: Medicare Other | Admitting: *Deleted

## 2017-03-21 ENCOUNTER — Other Ambulatory Visit: Payer: Self-pay | Admitting: Internal Medicine

## 2017-03-21 DIAGNOSIS — I4891 Unspecified atrial fibrillation: Secondary | ICD-10-CM | POA: Diagnosis not present

## 2017-03-21 DIAGNOSIS — E1143 Type 2 diabetes mellitus with diabetic autonomic (poly)neuropathy: Secondary | ICD-10-CM

## 2017-03-21 DIAGNOSIS — Z5181 Encounter for therapeutic drug level monitoring: Secondary | ICD-10-CM

## 2017-03-21 LAB — POCT INR: INR: 2.3

## 2017-03-27 ENCOUNTER — Encounter: Payer: Self-pay | Admitting: Cardiovascular Disease

## 2017-03-27 DIAGNOSIS — R3121 Asymptomatic microscopic hematuria: Secondary | ICD-10-CM | POA: Diagnosis not present

## 2017-03-27 DIAGNOSIS — Z8546 Personal history of malignant neoplasm of prostate: Secondary | ICD-10-CM | POA: Diagnosis not present

## 2017-03-27 DIAGNOSIS — Z87442 Personal history of urinary calculi: Secondary | ICD-10-CM | POA: Diagnosis not present

## 2017-03-30 DIAGNOSIS — C61 Malignant neoplasm of prostate: Secondary | ICD-10-CM | POA: Diagnosis not present

## 2017-03-30 DIAGNOSIS — K8020 Calculus of gallbladder without cholecystitis without obstruction: Secondary | ICD-10-CM | POA: Diagnosis not present

## 2017-03-30 DIAGNOSIS — R3121 Asymptomatic microscopic hematuria: Secondary | ICD-10-CM | POA: Diagnosis not present

## 2017-03-30 DIAGNOSIS — K5730 Diverticulosis of large intestine without perforation or abscess without bleeding: Secondary | ICD-10-CM | POA: Diagnosis not present

## 2017-03-30 DIAGNOSIS — N2889 Other specified disorders of kidney and ureter: Secondary | ICD-10-CM | POA: Diagnosis not present

## 2017-04-10 ENCOUNTER — Encounter: Payer: Self-pay | Admitting: Cardiovascular Disease

## 2017-04-10 ENCOUNTER — Ambulatory Visit (INDEPENDENT_AMBULATORY_CARE_PROVIDER_SITE_OTHER): Payer: Medicare Other | Admitting: Cardiovascular Disease

## 2017-04-10 ENCOUNTER — Ambulatory Visit (INDEPENDENT_AMBULATORY_CARE_PROVIDER_SITE_OTHER): Payer: Medicare Other | Admitting: *Deleted

## 2017-04-10 VITALS — BP 150/68 | HR 63 | Ht 70.0 in | Wt 258.0 lb

## 2017-04-10 DIAGNOSIS — I480 Paroxysmal atrial fibrillation: Secondary | ICD-10-CM | POA: Diagnosis not present

## 2017-04-10 DIAGNOSIS — E7800 Pure hypercholesterolemia, unspecified: Secondary | ICD-10-CM | POA: Diagnosis not present

## 2017-04-10 DIAGNOSIS — I2510 Atherosclerotic heart disease of native coronary artery without angina pectoris: Secondary | ICD-10-CM

## 2017-04-10 DIAGNOSIS — I4891 Unspecified atrial fibrillation: Secondary | ICD-10-CM | POA: Diagnosis not present

## 2017-04-10 DIAGNOSIS — I255 Ischemic cardiomyopathy: Secondary | ICD-10-CM

## 2017-04-10 DIAGNOSIS — I10 Essential (primary) hypertension: Secondary | ICD-10-CM | POA: Diagnosis not present

## 2017-04-10 DIAGNOSIS — Z5181 Encounter for therapeutic drug level monitoring: Secondary | ICD-10-CM

## 2017-04-10 LAB — POCT INR: INR: 2.1

## 2017-04-10 NOTE — Patient Instructions (Signed)

## 2017-04-10 NOTE — Progress Notes (Signed)
Chief Complaint  Patient presents with  . Follow-up     History of Present Illness: 76 yo male with a history of atrial fibrillation,DM, HTN, HLD, CAD, prostate CA, renal insufficiency, obesity here today for cardiac follow up. He was seen as a new patient 02/12/14 for evaluation of atrial fibrillation and was placed on Xarelto. Stress test was abnormal with apical and inferolateral scar and ischemia, LVEF of 32%. Echo 02/17/14 with LVEF=35-40% with akinesis of the mid-distalanteroseptal and apical myocardium, MAC, dilated LA. Cardiac cath 02/21/14 with occluded left main, moderate RCA stenosis with the entire left system filling from right to left collaterals. 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 He was discharged on amiodarone and coumadin.  Echo 05/28/14 with LVEF=35-40%, concentric LVH, mild MR.   He is here today for follow up. The patient denies any chest pain, dyspnea, palpitations, lower extremity edema, orthopnea, PND, dizziness, near syncope or syncope. '  Primary Care Physician: Gildardo Cranker, DO  Past Medical History:  Diagnosis Date  . Atrial fibrillation (HCC)    a. coumadin;  b. Amiodarone  . CAD (coronary artery disease)    a.  Lexiscan Myoview (02/14/14):  Apical, inferolateral scar; ? Mild ischemia; EF 32%.;  b.  LHC (02/21/14):  dLM 100%, LAD 100%, CFX 100%, dRCA 50%. => CABG (L-LAD, S-OM, S-PDA)  . Chronic systolic heart failure ([place])   . CKD (chronic kidney disease)    proteinuria  . Hemorrhage of gastrointestinal tract, unspecified   . Hemorrhage of rectum and anus   . HLD (hyperlipidemia)   . HTN (hypertension)   . Impotence of organic origin   . Ischemic cardiomyopathy    a. Echo (02/17/14):  EF 35-40%, anteroseptal and apical AK, Gr 2 DD, MAC, mod LAE.  [name] Nephrolithiasis   . Peripheral vascular disease, unspecified ([place])   . Prostate cancer ([place])   . Thrombocytopenia, unspecified ([place])   . Tobacco use disorder   . Type II or  unspecified type diabetes mellitus with renal manifestations, uncontrolled(250.42)     Past Surgical History:  Procedure Laterality Date  . BACK SURGERY    . CATARACT EXTRACTION  2010  . CLIPPING OF ATRIAL APPENDAGE Left 02/24/2014   Procedure: CLIPPING OF ATRIAL APPENDAGE;  Surgeon: Gaye Pollack, MD;  Location: [facility];  Service: Open Heart Surgery;  Laterality: Left;  . COLONOSCOPY  2005   Normal  . CORONARY ARTERY BYPASS GRAFT N/A 02/24/2014   Procedure: Coronary artery bypass graft times three using left internal mammary artery and left leg greater saphenous vein harvested endoscopically.;  Surgeon: Gaye Pollack, MD;  Location: [facility] OR;  Service: Open Heart Surgery;  Laterality: N/A;  . INTRAOPERATIVE TRANSESOPHAGEAL ECHOCARDIOGRAM N/A 02/24/2014   Procedure: INTRAOPERATIVE TRANSESOPHAGEAL ECHOCARDIOGRAM;  Surgeon: Gaye Pollack, MD;  Location: [facility];  Service: Open Heart Surgery;  Laterality: N/A;  . LEFT HEART CATHETERIZATION WITH CORONARY ANGIOGRAM N/A 02/21/2014   Procedure: LEFT HEART CATHETERIZATION WITH CORONARY ANGIOGRAM;  Surgeon: Peter M Martinique, MD;  Location: [facility] CATH LAB;  Service: Cardiovascular;  Laterality: N/A;    Current Outpatient Prescriptions  Medication Sig Dispense Refill  . aspirin EC 81 MG tablet Take 1 tablet (81 mg total) by mouth daily.    [name] atorvastatin (LIPITOR) 40 MG tablet Take 1 tablet (40 mg total) by mouth daily. 90 tablet 3  . Calcium Carbonate-Vitamin D 600-200 MG-UNIT TABS Take 1 tablet by mouth daily. For bones     .  carvedilol (COREG) 12.5 MG tablet Take 25 mg by mouth 2 (two) times daily with a meal.     . Flaxseed, Linseed, (FLAXSEED OIL PO) Take 1 tablet by mouth daily.     . furosemide (LASIX) 40 MG tablet TAKE 2 TABLETS BY MOUTH EVERY MORNING AND 1TAB EVERY EVENING 90 tablet 5  . glipiZIDE (GLUCOTROL) 5 MG tablet TAKE 2 TABLETS BY MOUTH EVERY MORNING AND 1 TABLET EVERY EVENING TO CONTROL BLOOD SUGAR 270 tablet 0  . Insulin Glargine (TOUJEO  SOLOSTAR) 300 UNIT/ML SOPN Inject 36 Units into the skin daily. 5 pen 5  . JANTOVEN 5 MG tablet TAKE 1 TABLET BY MOUTH DAILY OR AS DIRECTED BY COUMADIN CLINIC 45 tablet 3  . lisinopril (PRINIVIL,ZESTRIL) 20 MG tablet Take 20 mg by mouth. 2 tablets by mouth in the am, 2 tablets by mouth in the pm per kidney doctor 30 tablet 0  . metFORMIN (GLUCOPHAGE) 1000 MG tablet Take one tablet by mouth twice daily for diabetes 60 tablet 5  . Multiple Vitamin (MULTIVITAMINS PO) Take 1 tablet by mouth daily.      . potassium chloride SA (K-DUR,KLOR-CON) 20 MEQ tablet TAKE 1 TABLET (20 MEQ TOTAL) BY MOUTH DAILY. 90 tablet 0   No current facility-administered medications for this visit.     No Known Allergies  Social History   Social History  . Marital status: Married    Spouse name: N/A  . Number of children: 3  . Years of education: N/A   Occupational History  . Sell storage buildings    Social History Main Topics  . Smoking status: Never Smoker  . Smokeless tobacco: Former Systems developer    Types: Chew    Quit date: 02/23/2014  . Alcohol use No  . Drug use: No  . Sexual activity: No   Other Topics Concern  . Not on file   Social History Narrative  . No narrative on file    Family History  Problem Relation Age of Onset  . Cancer Mother 69    leukemia  . Heart disease Father   . Heart attack Father 60  . Lung cancer Brother     Review of Systems:  As stated in the HPI and otherwise negative.   BP (!) 150/68   Pulse 63   Ht 5' 10"  (1.778 m)   Wt 258 lb (117 kg)   SpO2 94%   BMI 37.02 kg/m   Physical Examination: General: Well developed, well nourished, NAD  HEENT: OP clear, mucus membranes moist  SKIN: warm, dry. No rashes. Neuro: No focal deficits  Musculoskeletal: Muscle strength 5/5 all ext  Psychiatric: Mood and affect normal  Neck: No JVD, no carotid bruits, no thyromegaly, no lymphadenopathy.  Lungs:Clear bilaterally, no wheezes, rhonci, crackles Cardiovascular: Regular  rate and rhythm. No murmurs, gallops or rubs. Abdomen:Soft. Bowel sounds present. Non-tender.  Extremities: No lower extremity edema. Pulses are 2 + in the bilateral DP/PT  Echo 05/28/14: Left ventricle: The cavity size was normal. There was moderate concentric hypertrophy. Systolic function was moderately reduced. The estimated ejection fraction was in the range of 35% to 40%. There is akinesis of the mid-apicalanterior, inferoseptal, and apical myocardium. Features are consistent with a pseudonormal left ventricular filling pattern, with concomitant abnormal relaxation and increased filling pressure (grade 2 diastolic dysfunction). - Mitral valve: There was mild regurgitation. - Left atrium: The atrium was mildly to moderately dilated. - Right atrium: The atrium was mildly dilated.  EKG:  EKG is  ordered today. The ekg ordered today demonstrates NSR, rate 63 bpm. LVH.   Recent Labs: 12/16/2016: ALT 17; BUN 19; Creat 0.88; Potassium 4.3; Sodium 140   Lipid Panel    Component Value Date/Time   CHOL 102 12/16/2016 1032   CHOL 101 06/08/2016 0843   TRIG 145 12/16/2016 1032   HDL 34 (L) 12/16/2016 1032   HDL 37 (L) 06/08/2016 0843   CHOLHDL 3.0 12/16/2016 1032   VLDL 29 12/16/2016 1032   LDLCALC 39 12/16/2016 1032   LDLCALC 29 06/08/2016 0843     Wt Readings from Last 3 Encounters:  04/10/17 258 lb (117 kg)  12/16/16 258 lb (117 kg)  12/16/16 258 lb (117 kg)     Other studies Reviewed: Additional studies/ records that were reviewed today include: . Review of the above records demonstrates:    Assessment and Plan:   1. CAD s/p CABG without angina: He is s/p CABG and is having no chest pain suggestive of angina. Continue current meds including ASA, statin, beta blocker.   2. Atrial fibrillation: Sinus today. Continue Coreg for rate control and coumadin for anti-coagulation.    3. Ischemic cardiomyopathy: LVEF=35-40% by echo June 2015. Continue Coreg and Ace-inh.   4.  Chronic systolic CHF: Volume status seems to be normal today. Will continue Lasix 80 mg am, 40 mg pm.    5. HTN: BP is slightly elevated today. He is holding his Norvasc due to dizziness. No changes today.    6. HLD: Lipids well controlled on statin. No changes.     Current medicines are reviewed at length with the patient today.  The patient does not have concerns regarding medicines.  The following changes have been made:  no change  Labs/ tests ordered today include:   Orders Placed This Encounter  Procedures  . EKG 12-Lead    Disposition:   FU with me in 6 months  Signed, Lauree Chandler, MD 04/10/2017 3:33 PM    [facility] [address] [place], [place], [place]  [address] Phone: [phone]; Fax: [phone]

## 2017-05-08 DIAGNOSIS — R809 Proteinuria, unspecified: Secondary | ICD-10-CM | POA: Diagnosis not present

## 2017-05-08 DIAGNOSIS — Z6841 Body Mass Index (BMI) 40.0 and over, adult: Secondary | ICD-10-CM | POA: Diagnosis not present

## 2017-05-08 DIAGNOSIS — I10 Essential (primary) hypertension: Secondary | ICD-10-CM | POA: Diagnosis not present

## 2017-05-17 ENCOUNTER — Ambulatory Visit (INDEPENDENT_AMBULATORY_CARE_PROVIDER_SITE_OTHER): Payer: Medicare Other | Admitting: Pharmacist

## 2017-05-17 DIAGNOSIS — Z5181 Encounter for therapeutic drug level monitoring: Secondary | ICD-10-CM | POA: Diagnosis not present

## 2017-05-17 DIAGNOSIS — I4891 Unspecified atrial fibrillation: Secondary | ICD-10-CM

## 2017-05-17 DIAGNOSIS — I255 Ischemic cardiomyopathy: Secondary | ICD-10-CM | POA: Diagnosis not present

## 2017-05-17 LAB — POCT INR: INR: 2.1

## 2017-05-18 ENCOUNTER — Other Ambulatory Visit: Payer: Self-pay | Admitting: Cardiovascular Disease

## 2017-05-24 DIAGNOSIS — N281 Cyst of kidney, acquired: Secondary | ICD-10-CM | POA: Diagnosis not present

## 2017-05-24 DIAGNOSIS — R3121 Asymptomatic microscopic hematuria: Secondary | ICD-10-CM | POA: Diagnosis not present

## 2017-05-24 DIAGNOSIS — Z8546 Personal history of malignant neoplasm of prostate: Secondary | ICD-10-CM | POA: Diagnosis not present

## 2017-05-29 ENCOUNTER — Other Ambulatory Visit: Payer: Self-pay | Admitting: Internal Medicine

## 2017-06-19 ENCOUNTER — Other Ambulatory Visit: Payer: Self-pay | Admitting: Internal Medicine

## 2017-06-19 DIAGNOSIS — E1143 Type 2 diabetes mellitus with diabetic autonomic (poly)neuropathy: Secondary | ICD-10-CM

## 2017-06-29 ENCOUNTER — Ambulatory Visit (INDEPENDENT_AMBULATORY_CARE_PROVIDER_SITE_OTHER): Payer: Medicare Other | Admitting: Pharmacist

## 2017-06-29 DIAGNOSIS — Z5181 Encounter for therapeutic drug level monitoring: Secondary | ICD-10-CM | POA: Diagnosis not present

## 2017-06-29 DIAGNOSIS — I4891 Unspecified atrial fibrillation: Secondary | ICD-10-CM

## 2017-06-29 LAB — POCT INR: INR: 1.9

## 2017-07-04 ENCOUNTER — Other Ambulatory Visit: Payer: Self-pay | Admitting: Internal Medicine

## 2017-07-04 DIAGNOSIS — E1143 Type 2 diabetes mellitus with diabetic autonomic (poly)neuropathy: Secondary | ICD-10-CM

## 2017-07-13 ENCOUNTER — Other Ambulatory Visit: Payer: Self-pay | Admitting: Internal Medicine

## 2017-07-13 ENCOUNTER — Telehealth: Payer: Self-pay | Admitting: *Deleted

## 2017-07-13 DIAGNOSIS — I10 Essential (primary) hypertension: Secondary | ICD-10-CM

## 2017-07-13 DIAGNOSIS — E1143 Type 2 diabetes mellitus with diabetic autonomic (poly)neuropathy: Secondary | ICD-10-CM

## 2017-07-13 DIAGNOSIS — Z794 Long term (current) use of insulin: Secondary | ICD-10-CM

## 2017-07-13 DIAGNOSIS — E782 Mixed hyperlipidemia: Secondary | ICD-10-CM

## 2017-07-13 DIAGNOSIS — Z79899 Other long term (current) drug therapy: Secondary | ICD-10-CM

## 2017-07-13 NOTE — Telephone Encounter (Signed)
Patient called and stated that he has scheduled an appointment with you on 07/25/2017. Patient is requesting labs to be done before his appointment to test his Diabetes. Do you wants labs before appointment and if so, which ones? Patient was last seen 12/16/16 with labwork. Please Advise.

## 2017-07-13 NOTE — Telephone Encounter (Signed)
Labs ordered. Please schedule appt.

## 2017-07-13 NOTE — Telephone Encounter (Signed)
LMOM for patient to return call to schedule Lab appointment. Dr. Eulas Post has already ordered labs.

## 2017-07-14 NOTE — Telephone Encounter (Signed)
Lab Scheduled.

## 2017-07-20 ENCOUNTER — Other Ambulatory Visit: Payer: Self-pay | Admitting: Internal Medicine

## 2017-07-20 ENCOUNTER — Other Ambulatory Visit: Payer: Self-pay | Admitting: *Deleted

## 2017-07-20 MED ORDER — POTASSIUM CHLORIDE CRYS ER 20 MEQ PO TBCR: EXTENDED_RELEASE_TABLET | ORAL | 0 refills | Status: DC

## 2017-07-20 NOTE — Telephone Encounter (Signed)
Patient called requested a refill on his potassium. Confirmed his appointment for labs and to see Dr. Eulas Post. Patient will keep appointment. Faxed a 15 day supply.

## 2017-07-21 ENCOUNTER — Other Ambulatory Visit: Payer: Medicare Other

## 2017-07-21 DIAGNOSIS — Z79899 Other long term (current) drug therapy: Secondary | ICD-10-CM

## 2017-07-21 DIAGNOSIS — E782 Mixed hyperlipidemia: Secondary | ICD-10-CM

## 2017-07-21 DIAGNOSIS — E1143 Type 2 diabetes mellitus with diabetic autonomic (poly)neuropathy: Secondary | ICD-10-CM

## 2017-07-21 DIAGNOSIS — Z794 Long term (current) use of insulin: Secondary | ICD-10-CM

## 2017-07-21 DIAGNOSIS — I10 Essential (primary) hypertension: Secondary | ICD-10-CM | POA: Diagnosis not present

## 2017-07-21 LAB — CBC WITH DIFFERENTIAL/PLATELET
Basophils Absolute: 0 cells/uL (ref 0–200)
Basophils Relative: 0 %
Eosinophils Absolute: 126 cells/uL (ref 15–500)
Eosinophils Relative: 2 %
HCT: 42.8 % (ref 38.5–50.0)
Hemoglobin: 14.3 g/dL (ref 13.2–17.1)
Lymphocytes Relative: 24 %
Lymphs Abs: 1512 cells/uL (ref 850–3900)
MCH: 28.3 pg (ref 27.0–33.0)
MCHC: 33.4 g/dL (ref 32.0–36.0)
MCV: 84.6 fL (ref 80.0–100.0)
MPV: 9.8 fL (ref 7.5–12.5)
Monocytes Absolute: 693 cells/uL (ref 200–950)
Monocytes Relative: 11 %
Neutro Abs: 3969 cells/uL (ref 1500–7800)
Neutrophils Relative %: 63 %
Platelets: 128 10*3/uL — ABNORMAL LOW (ref 140–400)
RBC: 5.06 MIL/uL (ref 4.20–5.80)
RDW: 14.1 % (ref 11.0–15.0)
WBC: 6.3 10*3/uL (ref 3.8–10.8)

## 2017-07-21 LAB — TSH: TSH: 2.03 mIU/L (ref 0.40–4.50)

## 2017-07-22 LAB — URINALYSIS, MICROSCOPIC ONLY
Bacteria, UA: NONE SEEN [HPF]
Casts: NONE SEEN [LPF]
Crystals: NONE SEEN [HPF]
Squamous Epithelial / LPF: NONE SEEN [HPF] (ref <=5)
WBC, UA: NONE SEEN WBC/HPF (ref <=5)
Yeast: NONE SEEN [HPF]

## 2017-07-22 LAB — COMPLETE METABOLIC PANEL WITH GFR
ALT: 17 U/L (ref 9–46)
AST: 14 U/L (ref 10–35)
Albumin: 3.7 g/dL (ref 3.6–5.1)
Alkaline Phosphatase: 55 U/L (ref 40–115)
BUN: 15 mg/dL (ref 7–25)
CO2: 22 mmol/L (ref 20–31)
Calcium: 8.5 mg/dL — ABNORMAL LOW (ref 8.6–10.3)
Chloride: 105 mmol/L (ref 98–110)
Creat: 0.84 mg/dL (ref 0.70–1.18)
GFR, Est African American: >89 mL/min (ref >=60)
GFR, Est Non African American: 86 mL/min (ref >=60)
Glucose, Bld: 274 mg/dL — ABNORMAL HIGH (ref 65–99)
Potassium: 4.1 mmol/L (ref 3.5–5.3)
Sodium: 140 mmol/L (ref 135–146)
Total Bilirubin: 0.5 mg/dL (ref 0.2–1.2)
Total Protein: 6.1 g/dL (ref 6.1–8.1)

## 2017-07-22 LAB — MICROALBUMIN / CREATININE URINE RATIO
Creatinine, Urine: 85 mg/dL (ref 20–370)
Microalb Creat Ratio: 806 mcg/mg creat — ABNORMAL HIGH (ref <30)
Microalb, Ur: 68.5 mg/dL

## 2017-07-22 LAB — URINALYSIS, ROUTINE W REFLEX MICROSCOPIC
Bilirubin Urine: NEGATIVE
Ketones, ur: NEGATIVE
Leukocytes, UA: NEGATIVE
Nitrite: NEGATIVE
Specific Gravity, Urine: 1.027 (ref 1.001–1.035)
pH: 7 (ref 5.0–8.0)

## 2017-07-22 LAB — LIPID PANEL
Cholesterol: 101 mg/dL (ref <200)
HDL: 38 mg/dL — ABNORMAL LOW (ref >40)
LDL Cholesterol: 40 mg/dL (ref <100)
Total CHOL/HDL Ratio: 2.7 Ratio (ref <5.0)
Triglycerides: 116 mg/dL (ref <150)
VLDL: 23 mg/dL (ref <30)

## 2017-07-22 LAB — HEMOGLOBIN A1C
Hgb A1c MFr Bld: 10.5 % — ABNORMAL HIGH (ref <5.7)
Mean Plasma Glucose: 255 mg/dL

## 2017-07-25 ENCOUNTER — Encounter: Payer: Self-pay | Admitting: Internal Medicine

## 2017-07-25 ENCOUNTER — Ambulatory Visit (INDEPENDENT_AMBULATORY_CARE_PROVIDER_SITE_OTHER): Payer: Medicare Other | Admitting: Internal Medicine

## 2017-07-25 VITALS — BP 152/72 | HR 67 | Temp 98.2°F | Ht 70.0 in | Wt 258.0 lb

## 2017-07-25 DIAGNOSIS — I10 Essential (primary) hypertension: Secondary | ICD-10-CM | POA: Diagnosis not present

## 2017-07-25 DIAGNOSIS — E782 Mixed hyperlipidemia: Secondary | ICD-10-CM

## 2017-07-25 DIAGNOSIS — E1143 Type 2 diabetes mellitus with diabetic autonomic (poly)neuropathy: Secondary | ICD-10-CM | POA: Diagnosis not present

## 2017-07-25 DIAGNOSIS — I5022 Chronic systolic (congestive) heart failure: Secondary | ICD-10-CM

## 2017-07-25 DIAGNOSIS — I480 Paroxysmal atrial fibrillation: Secondary | ICD-10-CM

## 2017-07-25 DIAGNOSIS — I255 Ischemic cardiomyopathy: Secondary | ICD-10-CM

## 2017-07-25 DIAGNOSIS — I739 Peripheral vascular disease, unspecified: Secondary | ICD-10-CM | POA: Diagnosis not present

## 2017-07-25 DIAGNOSIS — Z794 Long term (current) use of insulin: Secondary | ICD-10-CM

## 2017-07-25 MED ORDER — INSULIN GLARGINE 300 UNIT/ML ~~LOC~~ SOPN: 40.0000 [IU] | PEN_INJECTOR | Freq: Every day | SUBCUTANEOUS | 5 refills | Status: DC

## 2017-07-25 NOTE — Patient Instructions (Addendum)
Increase Toujeo to 40 units daily  Check blood sugars 3 times daily due to elevated sugars  Reduce red meat in diet  Cont other medications as ordered  Follow up with specialists as scheduled  Follow up in 3 mos for DM. Fasting labs prior to appt  Diabetes Mellitus and Food It is important for you to manage your blood sugar (glucose) level. Your blood glucose level can be greatly affected by what you eat. Eating healthier foods in the appropriate amounts throughout the day at about the same time each day will help you control your blood glucose level. It can also help slow or prevent worsening of your diabetes mellitus. Healthy eating may even help you improve the level of your blood pressure and reach or maintain a healthy weight. General recommendations for healthful eating and cooking habits include:  Eating meals and snacks regularly. Avoid going long periods of time without eating to lose weight.  Eating a diet that consists mainly of plant-based foods, such as fruits, vegetables, nuts, legumes, and whole grains.  Using low-heat cooking methods, such as baking, instead of high-heat cooking methods, such as deep frying.  Work with your dietitian to make sure you understand how to use the Nutrition Facts information on food labels. How can food affect me? Carbohydrates Carbohydrates affect your blood glucose level more than any other type of food. Your dietitian will help you determine how many carbohydrates to eat at each meal and teach you how to count carbohydrates. Counting carbohydrates is important to keep your blood glucose at a healthy level, especially if you are using insulin or taking certain medicines for diabetes mellitus. Alcohol Alcohol can cause sudden decreases in blood glucose (hypoglycemia), especially if you use insulin or take certain medicines for diabetes mellitus. Hypoglycemia can be a life-threatening condition. Symptoms of hypoglycemia (sleepiness, dizziness,  and disorientation) are similar to symptoms of having too much alcohol. If your health care provider has given you approval to drink alcohol, do so in moderation and use the following guidelines:  Women should not have more than one drink per day, and men should not have more than two drinks per day. One drink is equal to: ? 12 oz of beer. ? 5 oz of wine. ? 1 oz of hard liquor.  Do not drink on an empty stomach.  Keep yourself hydrated. Have water, diet soda, or unsweetened iced tea.  Regular soda, juice, and other mixers might contain a lot of carbohydrates and should be counted.  What foods are not recommended? As you make food choices, it is important to remember that all foods are not the same. Some foods have fewer nutrients per serving than other foods, even though they might have the same number of calories or carbohydrates. It is difficult to get your body what it needs when you eat foods with fewer nutrients. Examples of foods that you should avoid that are high in calories and carbohydrates but low in nutrients include:  Trans fats (most processed foods list trans fats on the Nutrition Facts label).  Regular soda.  Juice.  Candy.  Sweets, such as cake, pie, doughnuts, and cookies.  Fried foods.  What foods can I eat? Eat nutrient-rich foods, which will nourish your body and keep you healthy. The food you should eat also will depend on several factors, including:  The calories you need.  The medicines you take.  Your weight.  Your blood glucose level.  Your blood pressure level.  Your cholesterol level.  You should eat a variety of foods, including:  Protein. ? Lean cuts of meat. ? Proteins low in saturated fats, such as fish, egg whites, and beans. Avoid processed meats.  Fruits and vegetables. ? Fruits and vegetables that may help control blood glucose levels, such as apples, mangoes, and yams.  Dairy products. ? Choose fat-free or low-fat dairy  products, such as milk, yogurt, and cheese.  Grains, bread, pasta, and rice. ? Choose whole grain products, such as multigrain bread, whole oats, and brown rice. These foods may help control blood pressure.  Fats. ? Foods containing healthful fats, such as nuts, avocado, olive oil, canola oil, and fish.  Does everyone with diabetes mellitus have the same meal plan? Because every person with diabetes mellitus is different, there is not one meal plan that works for everyone. It is very important that you meet with a dietitian who will help you create a meal plan that is just right for you. This information is not intended to replace advice given to you by your health care provider. Make sure you discuss any questions you have with your health care provider. Document Released: 09/08/2005 Document Revised: 05/19/2016 Document Reviewed: 11/08/2013 Elsevier Interactive Patient Education  2017 [employer].

## 2017-07-25 NOTE — Progress Notes (Signed)
Patient ID: Seth [name], male   DOB: [birth year], 76 y.o.   MRN: [id number]    Location:  [facility] Place of Service: OFFICE  Chief Complaint  Patient presents with  . Medical Management of Chronic Issues    3 month follow-up blood sugar, blood pressure, CHF, cholesterol, review labs    HPI:  76 yo male seen today for f/u. He has not been seen since Dec 2017. He states he consumes a lot of red meat.  DM - BS elevated at home 200 - 300.  No low BS reactions. He reports no numbness or tingling. He is on toujeo 35 units daily, metformin, glipizide. A1c 10.5% (prev 9.6%). Urine microalbumin/Cr ratio 806 mcg/mg (prev. 445.6)  CAD/ICM/HTN/USA/HF - stable on ASA, coumadin, lipitor, coreg, lasix with KCL, lisinopril. No CP or SOB. Followed by cardio. LDL 40; HDL 38.  nephro Dr Mercy Moore added amlodipine 56m daily. He experienced dizziness upon standing and dose reduced to 1/2 tab daily. He still has dizziness upon standing. No swelling in legs  PAD - stable on ASA and coumadin. INR 1.9  afib - rate controlled on coreg. On jantoven with last INR 1.9. Followed by cardio  CKD - stable with nml Cr 0.84. Followed by nephrology Dr MMercy Moore K 4.1; HCO3 level 22  Hx prostate CA - stable. Followed by urology Dr WJeffie Pollock PSA 0.74 in March 2018.  Hyperlipidemia - stable on lipitor. LDL 40; HDL 38. No myalgias  Anemia - stable. Hgb 14.3   Past Medical History:  Diagnosis Date  . Atrial fibrillation (HCC)    a. coumadin;  b. Amiodarone  . CAD (coronary artery disease)    a.  Lexiscan Myoview (02/14/14):  Apical, inferolateral scar; ? Mild ischemia; EF 32%.;  b.  LHC (02/21/14):  dLM 100%, LAD 100%, CFX 100%, dRCA 50%. => CABG (L-LAD, S-OM, S-PDA)  . Chronic systolic heart failure ([facility]   . CKD (chronic kidney disease)    proteinuria  . Hemorrhage of gastrointestinal tract, unspecified   . Hemorrhage of rectum and anus   . HLD (hyperlipidemia)   . HTN (hypertension)   . Impotence of organic origin     . Ischemic cardiomyopathy    a. Echo (02/17/14):  EF 35-40%, anteroseptal and apical AK, Gr 2 DD, MAC, mod LAE.  .Marland KitchenNephrolithiasis   . Peripheral vascular disease, unspecified ([facility]   . Prostate cancer ([facility]   . Thrombocytopenia, unspecified ([facility]   . Tobacco use disorder   . 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 BACK SURGERY    . CATARACT EXTRACTION  2010  . CLIPPING OF ATRIAL APPENDAGE Left 02/24/2014   Procedure: CLIPPING OF ATRIAL APPENDAGE;  Surgeon: BGaye Pollack MD;  [facility]  Service: Open Heart Surgery;  Laterality: Left;  . COLONOSCOPY  2005   Normal  . CORONARY ARTERY BYPASS GRAFT N/A 02/24/2014   Procedure: Coronary artery bypass graft times three using left internal mammary artery and left leg greater saphenous vein harvested endoscopically.;  Surgeon: BGaye Pollack MD;  Location[facility];  Service: Open Heart Surgery;  Laterality: N/A;  . INTRAOPERATIVE TRANSESOPHAGEAL ECHOCARDIOGRAM N/A 02/24/2014   Procedure: INTRAOPERATIVE TRANSESOPHAGEAL ECHOCARDIOGRAM;  Surgeon: BGaye Pollack MD;  [facility];  Service: Open Heart Surgery;  Laterality: N/A;  . LEFT HEART CATHETERIZATION WITH CORONARY ANGIOGRAM N/A 02/21/2014   Procedure: LEFT HEART CATHETERIZATION WITH CORONARY ANGIOGRAM;  Surgeon: Peter M Martinique, MD;  Location[facility] C[facility] LAB;  Service: Cardiovascular;  Laterality: N/A;    Patient Care Team: Gildardo Cranker, DO as PCP - General (Internal Medicine) [name], MD as Attending Physician (Urology) [name], MD as Consulting Physician (Nephrology)  Social History   Social History  . Marital status: Married    Spouse name: N/A  . Number of children: 3  . Years of education: N/A   Occupational History  . Sell storage buildings    Social History Main Topics  . Smoking status: Never Smoker  . Smokeless tobacco: Former Systems developer    Types:  Chew    Quit date: 02/23/2014  . Alcohol use No  . Drug use: No  . Sexual activity: No   Other Topics Concern  . Not on file   Social History Narrative  . No narrative on file     reports that he has never smoked. He quit smokeless tobacco use about 3 years ago. His smokeless tobacco use included Chew. He reports that he does not drink alcohol or use drugs.  Family History  Problem Relation Age of Onset  . Cancer Mother 40       leukemia  . Heart disease Father   . Heart attack Father 9  . Lung cancer Brother    Family Status  Relation Status  . Mother Deceased  . Father Deceased at age 49       Heart attack   . Sister Deceased       unknown  . Brother Deceased  . Daughter Alive  . Daughter Alive  . Daughter Alive  . MGM Deceased  . MGF Deceased  . PGM Deceased  . PGF Deceased     No Known Allergies  Medications: Patient's Medications  New Prescriptions   No medications on file  Previous Medications   ASPIRIN EC 81 MG TABLET    Take 1 tablet (81 mg total) by mouth daily.   ATORVASTATIN (LIPITOR) 40 MG TABLET    Take 1 tablet (40 mg total) by mouth daily.   CALCIUM CARBONATE-VITAMIN D 600-200 MG-UNIT TABS    Take 1 tablet by mouth daily. For bones    CARVEDILOL (COREG) 12.5 MG TABLET    Take 25 mg by mouth 2 (two) times daily with a meal.    FLAXSEED, LINSEED, (FLAXSEED OIL PO)    Take 1 tablet by mouth daily.    FUROSEMIDE (LASIX) 40 MG TABLET    TAKE 2 TABLETS BY MOUTH EVERY MORNING AND 1TAB EVERY EVENING   GLIPIZIDE (GLUCOTROL) 5 MG TABLET    TAKE TWO TABLETS BY MOUTH IN THE MORNING AND ONE IN THE EVENING TO CONTROL BLOOD SUGAR   INSULIN GLARGINE (TOUJEO SOLOSTAR) 300 UNIT/ML SOPN    Inject 36 Units into the skin daily.   JANTOVEN 5 MG TABLET    TAKE 1 TABLET BY MOUTH DAILY OR AS DIRECTED BY COUMADIN CLINIC   LISINOPRIL (PRINIVIL,ZESTRIL) 20 MG TABLET    Take 20 mg by mouth. 2 tablets by mouth in the am, 2 tablets by mouth in the pm per kidney doctor    METFORMIN (GLUCOPHAGE) 1000 MG TABLET    APPOINTMENT OVERDUE Take one tablet by mouth twice daily for diabetes   MULTIPLE VITAMIN (MULTIVITAMINS PO)    Take 1 tablet by mouth daily.     POTASSIUM CHLORIDE SA (K-DUR,KLOR-CON) 20 MEQ TABLET    Take one tablet by mouth once daily  Modified Medications  No medications on file  Discontinued Medications   GLIPIZIDE (GLUCOTROL) 5 MG TABLET    APPOINTMENT OVERDUE, take two tablets in the morning and 1 in the evening to control blood sugar    Review of Systems  Musculoskeletal: Positive for arthralgias and joint swelling.  All other systems reviewed and are negative.   Vitals:   07/25/17 0922  BP: (!) 152/72  Pulse: 67  Temp: 98.2 F (36.8 C)  TempSrc: Oral  SpO2: 93%  Weight: 258 lb (117 kg)  Height: _0  (1.778 m)   Body mass index is 37.02 kg/m.  Physical Exam  Constitutional: He is oriented to person, place, and time. He appears well-developed and well-nourished.  HENT:  Mouth/Throat: Oropharynx is clear and moist.  MMM; no oral thrush  Eyes: Pupils are equal, round, and reactive to light. No scleral icterus.  Neck: Neck supple. Carotid bruit is not present. No thyromegaly present.  Cardiovascular: Normal rate, regular rhythm and intact distal pulses.  Exam reveals no gallop and no friction rub.   Murmur (1/6 SEM) heard. Chronic venous stasis changes b/l LE  Pulmonary/Chest: Effort normal and breath sounds normal. He has no wheezes. He has no rales. He exhibits no tenderness.  Abdominal: Soft. Normal appearance and bowel sounds are normal. He exhibits no distension, no abdominal bruit, no pulsatile midline mass and no mass. There is no hepatomegaly. There is no tenderness. There is no rigidity, no rebound and no guarding. No hernia.  obese  Musculoskeletal: He exhibits edema.  Lymphadenopathy:    He has no cervical adenopathy.  Neurological: He is alert and oriented to person, place, and time.  Skin: Skin is warm and dry.  No rash noted.  Psychiatric: He has a normal mood and affect. His behavior is normal. Judgment and thought content normal.   Diabetic Foot Exam - Simple   Simple Foot Form Diabetic Foot exam was performed with the following findings:  Yes 07/25/2017 10:13 AM  Visual Inspection No deformities, no ulcerations, no other skin breakdown bilaterally:  Yes Sensation Testing See comments:  Yes Pulse Check Posterior Tibialis and Dorsalis pulse intact bilaterally:  Yes Comments Reduced monofilament testing b/l; no calluses/ulcerations but does have toenail dystrophy      Labs reviewed: Lab on 07/21/2017  Component Date Value Ref Range Status  . WBC 07/21/2017 6.3  3.8 - 10.8 K/uL Final  . RBC 07/21/2017 5.06  4.20 - 5.80 MIL/uL Final  . Hemoglobin 07/21/2017 14.3  13.2 - 17.1 g/dL Final  . HCT 07/21/2017 42.8  38.5 - 50.0 % Final  . MCV 07/21/2017 84.6  80.0 - 100.0 fL Final  . MCH 07/21/2017 28.3  27.0 - 33.0 pg Final  . MCHC 07/21/2017 33.4  32.0 - 36.0 g/dL Final  . RDW 07/21/2017 14.1  11.0 - 15.0 % Final  . Platelets 07/21/2017 128* 140 - 400 K/uL Final  . MPV 07/21/2017 9.8  7.5 - 12.5 fL Final  . Neutro Abs 07/21/2017 3969  1,500 - 7,800 cells/uL Final  . Lymphs Abs 07/21/2017 1512  850 - 3,900 cells/uL Final  . Monocytes Absolute 07/21/2017 693  200 - 950 cells/uL Final  . Eosinophils Absolute 07/21/2017 126  15 - 500 cells/uL Final  . Basophils Absolute 07/21/2017 0  0 - 200 cells/uL Final  . Neutrophils Relative % 07/21/2017 63  % Final  . Lymphocytes Relative 07/21/2017 24  % Final  . Monocytes Relative 07/21/2017 11  % Final  . Eosinophils Relative 07/21/2017 2  %  Final  . Basophils Relative 07/21/2017 0  % Final  . Smear Review 07/21/2017 Criteria for review not met   Final  . Sodium 07/21/2017 140  135 - 146 mmol/L Final  . Potassium 07/21/2017 4.1  3.5 - 5.3 mmol/L Final  . Chloride 07/21/2017 105  98 - 110 mmol/L Final  . CO2 07/21/2017 22  20 - 31 mmol/L Final  .  Glucose, Bld 07/21/2017 274* 65 - 99 mg/dL Final  . BUN 07/21/2017 15  7 - 25 mg/dL Final  . Creat 07/21/2017 0.84  0.70 - 1.18 mg/dL Final   Comment:   For patients > or = 75 years of age: The upper reference limit for Creatinine is approximately 13% higher for people identified as African-American.     . Total Bilirubin 07/21/2017 0.5  0.2 - 1.2 mg/dL Final  . Alkaline Phosphatase 07/21/2017 55  40 - 115 U/L Final  . AST 07/21/2017 14  10 - 35 U/L Final  . ALT 07/21/2017 17  9 - 46 U/L Final  . Total Protein 07/21/2017 6.1  6.1 - 8.1 g/dL Final  . Albumin 07/21/2017 3.7  3.6 - 5.1 g/dL Final  . Calcium 07/21/2017 8.5* 8.6 - 10.3 mg/dL Final  . GFR, Est African American 07/21/2017 >89  >=60 mL/min Final  . GFR, Est Non African American 07/21/2017 86  >=60 mL/min Final  . Cholesterol 07/21/2017 101  <200 mg/dL Final  . Triglycerides 07/21/2017 116  <150 mg/dL Final  . HDL 07/21/2017 38* >40 mg/dL Final  . Total CHOL/HDL Ratio 07/21/2017 2.7  <5.0 Ratio Final  . VLDL 07/21/2017 23  <30 mg/dL Final  . LDL Cholesterol 07/21/2017 40  <100 mg/dL Final  . Hgb A1c MFr Bld 07/21/2017 10.5* <5.7 % Final   Comment:   For someone without known diabetes, a hemoglobin A1c value of 6.5% or greater indicates that they may have diabetes and this should be confirmed with a follow-up test.   For someone with known diabetes, a value <7% indicates that their diabetes is well controlled and a value greater than or equal to 7% indicates suboptimal control. A1c targets should be individualized based on duration of diabetes, age, comorbid conditions, and other considerations.   Currently, no consensus exists for use of hemoglobin A1c for diagnosis of diabetes for children.     . Mean Plasma Glucose 07/21/2017 255  mg/dL Final  . TSH 07/21/2017 2.03  0.40 - 4.50 mIU/L Final  . Color, Urine 07/21/2017 YELLOW  YELLOW Final  . APPearance 07/21/2017 CLEAR  CLEAR Final  . Specific Gravity, Urine  07/21/2017 1.027  1.001 - 1.035 Final  . pH 07/21/2017 7.0  5.0 - 8.0 Final  . Glucose, UA 07/21/2017 3+* NEGATIVE Final  . Bilirubin Urine 07/21/2017 NEGATIVE  NEGATIVE Final  . Ketones, ur 07/21/2017 NEGATIVE  NEGATIVE Final  . Hgb urine dipstick 07/21/2017 TRACE* NEGATIVE Final  . Protein, ur 07/21/2017 3+* NEGATIVE Final  . Nitrite 07/21/2017 NEGATIVE  NEGATIVE Final  . Leukocytes, UA 07/21/2017 NEGATIVE  NEGATIVE Final  . Creatinine, Urine 07/21/2017 85  20 - 370 mg/dL Final  . Microalb, Ur 07/21/2017 68.5  Not estab mg/dL Final   Comment: Result repeated and verified. Result confirmed by automatic dilution.   . Microalb Creat Ratio 07/21/2017 806* <30 mcg/mg creat Final   Comment: The ADA has defined abnormalities in albumin excretion as follows:           Category  Result                            (mcg/mg creatinine)                 Normal:    <30       Microalbuminuria:    30 - 299   Clinical albuminuria:    > or = 300   The ADA recommends that at least two of three specimens collected within a 3 - 6 month period be abnormal before considering a patient to be within a diagnostic category.     . WBC, UA 07/21/2017 NONE SEEN  <=5 WBC/HPF Final  . RBC / HPF 07/21/2017 3-10* <=2 RBC/HPF Final  . Squamous Epithelial / LPF 07/21/2017 NONE SEEN  <=5 HPF Final  . Bacteria, UA 07/21/2017 NONE SEEN  NONE SEEN HPF Final  . Crystals 07/21/2017 NONE SEEN  NONE SEEN HPF Final  . Casts 07/21/2017 NONE SEEN  NONE SEEN LPF Final  . Yeast 07/21/2017 NONE SEEN  NONE SEEN HPF Final  Anti-coag visit on 06/29/2017  Component Date Value Ref Range Status  . INR 06/29/2017 1.9   Final  Anti-coag visit on 05/17/2017  Component Date Value Ref Range Status  . INR 05/17/2017 2.1   Final    No results found.   Assessment/Plan   ICD-10-CM   1. Type 2 diabetes mellitus with diabetic autonomic neuropathy, with long-term current use of insulin (HCC) E11.43 Insulin Glargine (TOUJEO  SOLOSTAR) 300 UNIT/ML SOPN   Z79.4 CMP with eGFR    Hemoglobin A1c   uncontrolled  2. Essential hypertension I10    suboptimally controlled  3. Chronic systolic congestive heart failure (HCC) I50.22   4. Paroxysmal atrial fibrillation (HCC) I48.0   5. PVD (peripheral vascular disease) (HCC) I73.9   6. Mixed hyperlipidemia E78.2    Increase Toujeo to 40 units daily  Check blood sugars 3 times daily due to elevated sugars  Reduce red meat in diet. "DM and food" handout given  Cont other medications as ordered  Follow up with specialists as scheduled  Follow up in 3 mos for DM. Fasting labs prior to appt   [place] S. Perlie Gold  [facility] [address] [place], [place] [address] [phone] Cell (Monday-Friday 8 AM - 5 PM) [phone] After 5 PM and follow prompts

## 2017-07-28 ENCOUNTER — Other Ambulatory Visit: Payer: Self-pay | Admitting: Internal Medicine

## 2017-08-03 ENCOUNTER — Ambulatory Visit (INDEPENDENT_AMBULATORY_CARE_PROVIDER_SITE_OTHER): Payer: Medicare Other | Admitting: *Deleted

## 2017-08-03 DIAGNOSIS — I4891 Unspecified atrial fibrillation: Secondary | ICD-10-CM

## 2017-08-03 DIAGNOSIS — I255 Ischemic cardiomyopathy: Secondary | ICD-10-CM | POA: Diagnosis not present

## 2017-08-03 DIAGNOSIS — Z5181 Encounter for therapeutic drug level monitoring: Secondary | ICD-10-CM | POA: Diagnosis not present

## 2017-08-03 LAB — POCT INR: INR: 2

## 2017-08-04 DIAGNOSIS — R809 Proteinuria, unspecified: Secondary | ICD-10-CM | POA: Diagnosis not present

## 2017-08-04 DIAGNOSIS — Z6841 Body Mass Index (BMI) 40.0 and over, adult: Secondary | ICD-10-CM | POA: Diagnosis not present

## 2017-08-04 DIAGNOSIS — I10 Essential (primary) hypertension: Secondary | ICD-10-CM | POA: Diagnosis not present

## 2017-08-09 ENCOUNTER — Other Ambulatory Visit: Payer: Self-pay | Admitting: Cardiovascular Disease

## 2017-09-13 ENCOUNTER — Ambulatory Visit (INDEPENDENT_AMBULATORY_CARE_PROVIDER_SITE_OTHER): Payer: Medicare Other | Admitting: *Deleted

## 2017-09-13 DIAGNOSIS — I4891 Unspecified atrial fibrillation: Secondary | ICD-10-CM

## 2017-09-13 DIAGNOSIS — Z5181 Encounter for therapeutic drug level monitoring: Secondary | ICD-10-CM

## 2017-09-13 LAB — POCT INR: INR: 2.4

## 2017-09-16 ENCOUNTER — Other Ambulatory Visit: Payer: Self-pay | Admitting: Internal Medicine

## 2017-09-16 ENCOUNTER — Other Ambulatory Visit: Payer: Self-pay | Admitting: Cardiovascular Disease

## 2017-09-19 ENCOUNTER — Other Ambulatory Visit: Payer: Self-pay | Admitting: Cardiovascular Disease

## 2017-10-23 ENCOUNTER — Other Ambulatory Visit: Payer: Medicare Other

## 2017-10-23 DIAGNOSIS — E1143 Type 2 diabetes mellitus with diabetic autonomic (poly)neuropathy: Secondary | ICD-10-CM

## 2017-10-23 DIAGNOSIS — Z794 Long term (current) use of insulin: Secondary | ICD-10-CM | POA: Diagnosis not present

## 2017-10-24 LAB — COMPLETE METABOLIC PANEL WITH GFR
AG Ratio: 1.4 (calc) (ref 1.0–2.5)
ALT: 16 U/L (ref 9–46)
AST: 12 U/L (ref 10–35)
Albumin: 3.7 g/dL (ref 3.6–5.1)
Alkaline phosphatase (APISO): 62 U/L (ref 40–115)
BUN: 18 mg/dL (ref 7–25)
CO2: 30 mmol/L (ref 20–32)
Calcium: 9.1 mg/dL (ref 8.6–10.3)
Chloride: 102 mmol/L (ref 98–110)
Creat: 0.94 mg/dL (ref 0.70–1.18)
GFR, Est African American: 92 mL/min/{1.73_m2} (ref >=60)
GFR, Est Non African American: 79 mL/min/{1.73_m2} (ref >=60)
Globulin: 2.7 g/dL (calc) (ref 1.9–3.7)
Glucose, Bld: 345 mg/dL — ABNORMAL HIGH (ref 65–99)
Potassium: 4.4 mmol/L (ref 3.5–5.3)
Sodium: 141 mmol/L (ref 135–146)
Total Bilirubin: 0.7 mg/dL (ref 0.2–1.2)
Total Protein: 6.4 g/dL (ref 6.1–8.1)

## 2017-10-24 LAB — HEMOGLOBIN A1C
Hgb A1c MFr Bld: 10.2 % of total Hgb — ABNORMAL HIGH (ref <5.7)
Mean Plasma Glucose: 246 (calc)
eAG (mmol/L): 13.6 (calc)

## 2017-10-25 ENCOUNTER — Ambulatory Visit (INDEPENDENT_AMBULATORY_CARE_PROVIDER_SITE_OTHER): Payer: Medicare Other | Admitting: Pharmacist Clinician (PhC)/ Clinical Pharmacy Specialist

## 2017-10-25 DIAGNOSIS — Z5181 Encounter for therapeutic drug level monitoring: Secondary | ICD-10-CM | POA: Diagnosis not present

## 2017-10-25 DIAGNOSIS — I4891 Unspecified atrial fibrillation: Secondary | ICD-10-CM

## 2017-10-25 LAB — POCT INR: INR: 1.6

## 2017-10-27 ENCOUNTER — Encounter: Payer: Self-pay | Admitting: Internal Medicine

## 2017-10-27 ENCOUNTER — Ambulatory Visit (INDEPENDENT_AMBULATORY_CARE_PROVIDER_SITE_OTHER): Payer: Medicare Other | Admitting: Internal Medicine

## 2017-10-27 VITALS — BP 134/68 | HR 69 | Temp 97.9°F | Ht 70.0 in | Wt 258.0 lb

## 2017-10-27 DIAGNOSIS — Z794 Long term (current) use of insulin: Secondary | ICD-10-CM | POA: Diagnosis not present

## 2017-10-27 DIAGNOSIS — Z23 Encounter for immunization: Secondary | ICD-10-CM

## 2017-10-27 DIAGNOSIS — I480 Paroxysmal atrial fibrillation: Secondary | ICD-10-CM | POA: Diagnosis not present

## 2017-10-27 DIAGNOSIS — I739 Peripheral vascular disease, unspecified: Secondary | ICD-10-CM | POA: Diagnosis not present

## 2017-10-27 DIAGNOSIS — I10 Essential (primary) hypertension: Secondary | ICD-10-CM | POA: Diagnosis not present

## 2017-10-27 DIAGNOSIS — E66812 Obesity, class 2: Secondary | ICD-10-CM

## 2017-10-27 DIAGNOSIS — Z6837 Body mass index (BMI) 37.0-37.9, adult: Secondary | ICD-10-CM | POA: Diagnosis not present

## 2017-10-27 DIAGNOSIS — E1143 Type 2 diabetes mellitus with diabetic autonomic (poly)neuropathy: Secondary | ICD-10-CM | POA: Diagnosis not present

## 2017-10-27 DIAGNOSIS — E1129 Type 2 diabetes mellitus with other diabetic kidney complication: Secondary | ICD-10-CM | POA: Insufficient documentation

## 2017-10-27 DIAGNOSIS — I5022 Chronic systolic (congestive) heart failure: Secondary | ICD-10-CM

## 2017-10-27 DIAGNOSIS — E782 Mixed hyperlipidemia: Secondary | ICD-10-CM | POA: Diagnosis not present

## 2017-10-27 DIAGNOSIS — I255 Ischemic cardiomyopathy: Secondary | ICD-10-CM

## 2017-10-27 DIAGNOSIS — R809 Proteinuria, unspecified: Secondary | ICD-10-CM

## 2017-10-27 NOTE — Patient Instructions (Addendum)
Continue current medications as ordered  Reduce calories (recommend 1500 calories per day) and increase exercise as tolerated  Flu shot given today  Follow up with specialists as scheduled  Follow up in 3 mos for DM, HTN, obesity, afib. Fasting labs prior to appt

## 2017-10-27 NOTE — Progress Notes (Signed)
Location:  [facility] of Service: OFFICE  Chief Complaint  Patient presents with  . Medical Management of Chronic Issues    3 month follow-up, discuss labs (copy printed)   . Medication Refill    No refills needed   . Immunizations    Flu Vaccine today     HPI:  76 yo male seen today for f/u. He has no concerns today. BS elevated at home. A1c still elevated. Toujeo increased 2 days ago to 46 units daily. He had a sore on right foot a few weeks ago that resolved after soaking it daily x 2 weeks  DM - BS elevated at home 200 - 300.  No low BS reactions. He has neuropathy in feet. He is on toujeo 46 units daily, metformin, glipizide. A1c 10.2% (prev 10.5%). Urine microalbumin/Cr ratio 806 mcg/mg (prev. 445.6)  CAD/ICM/HTN/USA/HF - stable on ASA, coumadin, lipitor, coreg, lasix with KCL, lisinopril. No CP or SOB. Followed by cardio Dr Angelena Form. LDL 40; HDL 38.  nephro [facility] added amlodipine 49m daily. He experienced dizziness upon standing and dose reduced to 1/2 tab daily. He still has dizziness upon standing. No swelling in legs  PAD - stable on ASA and coumadin. INR 1.6  afib - rate controlled on coreg. On jantoven with last INR 1.6. Followed by cardio  Hx CKD - stable with nml Cr 0.94. [facility]. K 4.4; HCO3 level 30  Hx prostate CA - stable. Followed by urology Dr WJeffie Pollock PSA 0.74 in March 2018.  Hyperlipidemia - stable on lipitor. LDL 40; HDL 38. No myalgias  Anemia - stable. Hgb 14.3  Past Medical History:  Diagnosis Date  . Atrial fibrillation (HCC)    a. coumadin;  b. Amiodarone  . CAD (coronary artery disease)    a.  Lexiscan Myoview (02/14/14):  Apical, inferolateral scar; ? Mild ischemia; EF 32%.;  b.  LHC (02/21/14):  dLM 100%, LAD 100%, CFX 100%, dRCA 50%. => CABG (L-LAD, S-OM, S-PDA)  . Chronic systolic heart failure ([facility]   . CKD (chronic kidney disease)    proteinuria  . Hemorrhage of gastrointestinal  tract, unspecified   . Hemorrhage of rectum and anus   . HLD (hyperlipidemia)   . HTN (hypertension)   . Impotence of organic origin   . Ischemic cardiomyopathy    a. Echo (02/17/14):  EF 35-40%, anteroseptal and apical AK, Gr 2 DD, MAC, mod LAE.  .Marland KitchenNephrolithiasis   . Peripheral vascular disease, unspecified ([facility]   . Prostate cancer ([facility]   . Thrombocytopenia, unspecified ([facility]   . Tobacco use disorder   . 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 BACK SURGERY    . CATARACT EXTRACTION  2010  . CLIPPING OF ATRIAL APPENDAGE Left 02/24/2014   Procedure: CLIPPING OF ATRIAL APPENDAGE;  Surgeon: BGaye Pollack MD;  [facility]  Service: Open Heart Surgery;  Laterality: Left;  . COLONOSCOPY  2005   Normal  . CORONARY ARTERY BYPASS GRAFT N/A 02/24/2014   Procedure: Coronary artery bypass graft times three using left internal mammary artery and left leg greater saphenous vein harvested endoscopically.;  Surgeon: BGaye Pollack MD;  [facility];  Service: Open Heart Surgery;  Laterality: N/A;  . INTRAOPERATIVE TRANSESOPHAGEAL ECHOCARDIOGRAM N/A 02/24/2014   Procedure: INTRAOPERATIVE TRANSESOPHAGEAL ECHOCARDIOGRAM;  Surgeon: BGaye Pollack MD;  [place] [facility];  Service: Open Heart Surgery;  Laterality: N/A;  . LEFT HEART CATHETERIZATION WITH CORONARY ANGIOGRAM N/A 02/21/2014   Procedure: LEFT HEART CATHETERIZATION WITH CORONARY ANGIOGRAM;  Surgeon: Peter M Martinique, MD;  [facility];  Service: Cardiovascular;  Laterality: N/A;    Patient Care Team: Gildardo Cranker, DO as PCP - General (Internal Medicine) [name], MD as Attending Physician (Urology) [name], MD as Consulting Physician (Nephrology)  Social History   Social History  . Marital status: Married    Spouse name: N/A  . Number of children: 3  . Years of education: N/A   Occupational History   . Sell storage buildings    Social History Main Topics  . Smoking status: Never Smoker  . Smokeless tobacco: Former Systems developer    Types: Chew    Quit date: 02/23/2014  . Alcohol use No  . Drug use: No  . Sexual activity: No   Other Topics Concern  . Not on file   Social History Narrative  . No narrative on file     reports that he has never smoked. He quit smokeless tobacco use about 3 years ago. His smokeless tobacco use included Chew. He reports that he does not drink alcohol or use drugs.  Family History  Problem Relation Age of Onset  . Cancer Mother 35       leukemia  . Heart disease Father   . Heart attack Father 82  . Lung cancer Brother    Family Status  Relation Status  . Mother Deceased  . Father Deceased at age 35       Heart attack   . Sister Deceased       unknown  . Brother Deceased  . Daughter Alive  . Daughter Alive  . Daughter Alive  . MGM Deceased  . MGF Deceased  . PGM Deceased  . PGF Deceased     No Known Allergies  Medications: Patient's Medications  New Prescriptions   No medications on file  Previous Medications   AMLODIPINE (NORVASC) 5 MG TABLET    Take 5 mg by mouth daily.    ASPIRIN EC 81 MG TABLET    Take 1 tablet (81 mg total) by mouth daily.   ATORVASTATIN (LIPITOR) 40 MG TABLET    TAKE 1 TABLET (40 MG TOTAL) BY MOUTH DAILY.   CALCIUM CARBONATE-VITAMIN D 600-200 MG-UNIT TABS    Take 1 tablet by mouth daily. For bones    CARVEDILOL PO    Take 25 mg by mouth 2 (two) times daily with a meal.    FLAXSEED, LINSEED, (FLAXSEED OIL PO)    Take 1 tablet by mouth daily.    FUROSEMIDE (LASIX) 40 MG TABLET    Take two (2) tablets (55m) by mouth each morning and one (1) tablet (42m by mouth each evening.   GLIPIZIDE (GLUCOTROL) 5 MG TABLET    TAKE TWO TABLETS BY MOUTH IN THE MORNING AND ONE IN THE EVENING TO CONTROL BLOOD SUGAR   INSULIN GLARGINE (TOUJEO SOLOSTAR) 300 UNIT/ML SOPN    Inject 40 Units into the skin daily.   JANTOVEN 5 MG TABLET     TAKE 1 TABLET BY MOUTH DAILY OR AS DIRECTED BY COUMADIN CLINIC   LISINOPRIL (PRINIVIL,ZESTRIL) 40 MG TABLET    Take 40 mg by mouth 2 (two) times daily. Per nephrology   METFORMIN (GLUCOPHAGE) 1000 MG TABLET    Take 1 tablet (1,000 mg total) by mouth 2 (two) times daily with a meal.  MULTIPLE VITAMIN (MULTIVITAMINS PO)    Take 1 tablet by mouth daily.     POTASSIUM CHLORIDE SA (K-DUR,KLOR-CON) 20 MEQ TABLET    TAKE 1 TABLET BY MOUTH DAILY  Modified Medications   No medications on file  Discontinued Medications   AMLODIPINE (NORVASC) 2.5 MG TABLET    Take 2.5 mg by mouth daily. Take 1 tab every other day alternating with 52m dose per Nephrology   FUROSEMIDE (LASIX) 40 MG TABLET    TAKE 2 TABLETS BY MOUTH EVERY MORNING AND 1TAB EVERY EVENING    Review of Systems  Neurological: Positive for numbness.  All other systems reviewed and are negative.   Vitals:   10/27/17 0925  BP: 134/68  Pulse: 69  Temp: 97.9 F (36.6 C)  TempSrc: Oral  SpO2: 95%  Weight: 258 lb (117 kg)  Height: 5' 10" (1.778 m)   Body mass index is 37.02 kg/m.  Physical Exam  Constitutional: He is oriented to person, place, and time. He appears well-developed and well-nourished.  HENT:  Mouth/Throat: Oropharynx is clear and moist.  MMM; no oral thrush  Eyes: Pupils are equal, round, and reactive to light. No scleral icterus.  Neck: Neck supple. Carotid bruit is not present. No thyromegaly present.  Cardiovascular: Normal rate, regular rhythm and intact distal pulses.  Exam reveals no gallop and no friction rub.   Murmur (1/6 SEM) heard. Trace LE edema b/l. No calf TTP  Pulmonary/Chest: Effort normal and breath sounds normal. He has no wheezes. He has no rales. He exhibits no tenderness.  Abdominal: Soft. Normal appearance and bowel sounds are normal. He exhibits no distension, no abdominal bruit, no pulsatile midline mass and no mass. There is no hepatomegaly. There is no tenderness. There is no rigidity, no  rebound and no guarding. No hernia.  obese  Musculoskeletal: He exhibits edema.  Lymphadenopathy:    He has no cervical adenopathy.  Neurological: He is alert and oriented to person, place, and time.  Skin: Skin is warm and dry. No rash noted.  Psychiatric: He has a normal mood and affect. His behavior is normal. Judgment and thought content normal.     Labs reviewed: Anti-coag visit on 10/25/2017  Component Date Value Ref Range Status  . INR 10/25/2017 1.6   Final  Appointment on 10/23/2017  Component Date Value Ref Range Status  . Glucose, Bld 10/23/2017 345* 65 - 99 mg/dL Final   Comment: .            Fasting reference interval . For someone without known diabetes, a glucose value >125 mg/dL indicates that they may have diabetes and this should be confirmed with a follow-up test. .   . BUN 10/23/2017 18  7 - 25 mg/dL Final  . Creat 10/23/2017 0.94  0.70 - 1.18 mg/dL Final   Comment: For patients >468years of age, the reference limit for Creatinine is approximately 13% higher for people identified as African-American. .   . GFR, Est Non African American 10/23/2017 79  > OR = 60 mL/min/1.752mFinal  . GFR, Est African American 10/23/2017 92  > OR = 60 mL/min/1.7322minal  . BUN/Creatinine Ratio 10/13/83/1660T APPLICABLE  6 - 22 (calc) Final  . Sodium 10/23/2017 141  135 - 146 mmol/L Final  . Potassium 10/23/2017 4.4  3.5 - 5.3 mmol/L Final  . Chloride 10/23/2017 102  98 - 110 mmol/L Final  . CO2 10/23/2017 30  20 - 32 mmol/L Final  . Calcium 10/23/2017  9.1  8.6 - 10.3 mg/dL Final  . Total Protein 10/23/2017 6.4  6.1 - 8.1 g/dL Final  . Albumin 10/23/2017 3.7  3.6 - 5.1 g/dL Final  . Globulin 10/23/2017 2.7  1.9 - 3.7 g/dL (calc) Final  . AG Ratio 10/23/2017 1.4  1.0 - 2.5 (calc) Final  . Total Bilirubin 10/23/2017 0.7  0.2 - 1.2 mg/dL Final  . Alkaline phosphatase (APISO) 10/23/2017 62  40 - 115 U/L Final  . AST 10/23/2017 12  10 - 35 U/L Final  . ALT 10/23/2017 16   9 - 46 U/L Final  . Hgb A1c MFr Bld 10/23/2017 10.2* <5.7 % of total Hgb Final   Comment: For someone without known diabetes, a hemoglobin A1c value of 6.5% or greater indicates that they may have  diabetes and this should be confirmed with a follow-up  test. . For someone with known diabetes, a value <7% indicates  that their diabetes is well controlled and a value  greater than or equal to 7% indicates suboptimal  control. A1c targets should be individualized based on  duration of diabetes, age, comorbid conditions, and  other considerations. . Currently, no consensus exists regarding use of hemoglobin A1c for diagnosis of diabetes for children. .   . Mean Plasma Glucose 10/23/2017 246  (calc) Final  . eAG (mmol/L) 10/23/2017 13.6  (calc) Final  Anti-coag visit on 09/13/2017  Component Date Value Ref Range Status  . INR 09/13/2017 2.4   Final  Anti-coag visit on 08/03/2017  Component Date Value Ref Range Status  . INR 08/03/2017 2.0   Final    No results found.   Assessment/Plan   ICD-10-CM   1. Type 2 diabetes mellitus with diabetic autonomic neuropathy, with long-term current use of insulin (HCC) E11.43 CMP with eGFR   Z79.4 Hemoglobin A1c    Microalbumin/Creatinine Ratio, Urine  2. Paroxysmal atrial fibrillation (HCC) I48.0   3. Chronic systolic congestive heart failure (HCC) I50.22   4. Essential hypertension I10   5. Mixed hyperlipidemia E78.2 Lipid Panel  6. PVD (peripheral vascular disease) (HCC) I73.9   7. Class 2 severe obesity due to excess calories with serious comorbidity and body mass index (BMI) of 37.0 to 37.9 in adult (HCC) E66.01    Z68.37   8. Need for immunization against influenza Z23 Flu Vaccine QUAD 36+ mos IM   Continue current medications as ordered  Reduce calories (recommend 1500 calories per day) and increase exercise as tolerated  Flu shot given today  Follow up with specialists as scheduled  Follow up in 3 mos for DM, HTN, obesity,  afib. Fasting labs prior to appt  [place] S. Perlie Gold  [facility] and Adult Medicine [address] [place], [place] [address] [phone] Cell (Monday-Friday 8 AM - 5 PM) [phone] After 5 PM and follow prompts

## 2017-10-28 ENCOUNTER — Other Ambulatory Visit: Payer: Self-pay | Admitting: Internal Medicine

## 2017-10-28 DIAGNOSIS — E1143 Type 2 diabetes mellitus with diabetic autonomic (poly)neuropathy: Secondary | ICD-10-CM

## 2017-10-28 DIAGNOSIS — Z794 Long term (current) use of insulin: Secondary | ICD-10-CM

## 2017-10-30 ENCOUNTER — Other Ambulatory Visit: Payer: Self-pay

## 2017-10-30 DIAGNOSIS — Z794 Long term (current) use of insulin: Secondary | ICD-10-CM

## 2017-10-30 DIAGNOSIS — E1143 Type 2 diabetes mellitus with diabetic autonomic (poly)neuropathy: Secondary | ICD-10-CM

## 2017-10-30 MED ORDER — INSULIN GLARGINE 300 UNIT/ML ~~LOC~~ SOPN: 40.0000 [IU] | PEN_INJECTOR | Freq: Every day | SUBCUTANEOUS | 5 refills | Status: DC

## 2017-10-30 MED ORDER — INSULIN GLARGINE 300 UNIT/ML ~~LOC~~ SOPN: 40.0000 [IU] | PEN_INJECTOR | Freq: Every day | SUBCUTANEOUS | 4 refills | Status: DC

## 2017-10-30 NOTE — Addendum Note (Signed)
Addended by: Ripley Fraise on: 10/30/2017 02:44 PM   Modules accepted: Orders

## 2017-11-14 ENCOUNTER — Ambulatory Visit (INDEPENDENT_AMBULATORY_CARE_PROVIDER_SITE_OTHER): Payer: Medicare Other | Admitting: *Deleted

## 2017-11-14 DIAGNOSIS — I4891 Unspecified atrial fibrillation: Secondary | ICD-10-CM | POA: Diagnosis not present

## 2017-11-14 DIAGNOSIS — Z5181 Encounter for therapeutic drug level monitoring: Secondary | ICD-10-CM | POA: Diagnosis not present

## 2017-11-14 LAB — POCT INR: INR: 2.1

## 2017-11-14 NOTE — Patient Instructions (Signed)
Continue taking 1.5 tablets everyday except 1 tablet on Tuesdays and Saturdays.  Recheck in 4 weeks. Call with any new medications [phone]

## 2017-11-28 ENCOUNTER — Telehealth: Payer: Self-pay | Admitting: Internal Medicine

## 2017-11-28 NOTE — Telephone Encounter (Signed)
I called the patient to schedule AWV-S with nurse, but he declined. VDM (DD)

## 2017-11-30 ENCOUNTER — Other Ambulatory Visit: Payer: Self-pay

## 2017-12-04 ENCOUNTER — Other Ambulatory Visit: Payer: Self-pay | Admitting: Internal Medicine

## 2017-12-12 ENCOUNTER — Ambulatory Visit (INDEPENDENT_AMBULATORY_CARE_PROVIDER_SITE_OTHER): Payer: Medicare Other | Admitting: *Deleted

## 2017-12-12 DIAGNOSIS — Z5181 Encounter for therapeutic drug level monitoring: Secondary | ICD-10-CM

## 2017-12-12 DIAGNOSIS — I4891 Unspecified atrial fibrillation: Secondary | ICD-10-CM

## 2017-12-12 LAB — POCT INR: INR: 2.2

## 2017-12-12 NOTE — Patient Instructions (Signed)
Description   Continue taking 1.5 tablets everyday except 1 tablet on Tuesdays and Saturdays.  Recheck in 4 weeks. Call with any new medications [phone]

## 2018-01-09 ENCOUNTER — Ambulatory Visit (INDEPENDENT_AMBULATORY_CARE_PROVIDER_SITE_OTHER): Payer: Medicare Other | Admitting: *Deleted

## 2018-01-09 DIAGNOSIS — I4891 Unspecified atrial fibrillation: Secondary | ICD-10-CM

## 2018-01-09 DIAGNOSIS — Z5181 Encounter for therapeutic drug level monitoring: Secondary | ICD-10-CM

## 2018-01-09 LAB — POCT INR: INR: 1.6

## 2018-01-09 NOTE — Patient Instructions (Signed)
Description   Today take 1.5 tablets then continue taking 1.5 tablets everyday except 1 tablet on Tuesdays and Saturdays.  Recheck in 3 weeks. Call with any new medications [phone]

## 2018-01-30 ENCOUNTER — Ambulatory Visit (INDEPENDENT_AMBULATORY_CARE_PROVIDER_SITE_OTHER): Payer: Medicare Other | Admitting: *Deleted

## 2018-01-30 DIAGNOSIS — I4891 Unspecified atrial fibrillation: Secondary | ICD-10-CM

## 2018-01-30 DIAGNOSIS — Z5181 Encounter for therapeutic drug level monitoring: Secondary | ICD-10-CM

## 2018-01-30 LAB — POCT INR: INR: 1.6

## 2018-01-30 NOTE — Patient Instructions (Signed)
Description   Today take 1.5 tablets then start taking 1.5 tablets everyday except 1 tablet on Tuesdays. Recheck in 2 weeks. Call with any new medications [phone]

## 2018-02-02 ENCOUNTER — Other Ambulatory Visit: Payer: Self-pay | Admitting: Internal Medicine

## 2018-02-02 ENCOUNTER — Other Ambulatory Visit: Payer: Self-pay | Admitting: Cardiovascular Disease

## 2018-02-02 ENCOUNTER — Other Ambulatory Visit: Payer: Medicare Other

## 2018-02-02 DIAGNOSIS — Z794 Long term (current) use of insulin: Secondary | ICD-10-CM

## 2018-02-02 DIAGNOSIS — E782 Mixed hyperlipidemia: Secondary | ICD-10-CM | POA: Diagnosis not present

## 2018-02-02 DIAGNOSIS — E1143 Type 2 diabetes mellitus with diabetic autonomic (poly)neuropathy: Secondary | ICD-10-CM | POA: Diagnosis not present

## 2018-02-03 LAB — COMPLETE METABOLIC PANEL WITH GFR
AG Ratio: 1.5 (calc) (ref 1.0–2.5)
ALT: 16 U/L (ref 9–46)
AST: 13 U/L (ref 10–35)
Albumin: 3.7 g/dL (ref 3.6–5.1)
Alkaline phosphatase (APISO): 56 U/L (ref 40–115)
BUN: 17 mg/dL (ref 7–25)
CO2: 31 mmol/L (ref 20–32)
Calcium: 9 mg/dL (ref 8.6–10.3)
Chloride: 101 mmol/L (ref 98–110)
Creat: 0.93 mg/dL (ref 0.70–1.18)
GFR, Est African American: 93 mL/min/{1.73_m2} (ref >=60)
GFR, Est Non African American: 80 mL/min/{1.73_m2} (ref >=60)
Globulin: 2.5 g/dL (calc) (ref 1.9–3.7)
Glucose, Bld: 307 mg/dL — ABNORMAL HIGH (ref 65–99)
Potassium: 4.1 mmol/L (ref 3.5–5.3)
Sodium: 140 mmol/L (ref 135–146)
Total Bilirubin: 0.6 mg/dL (ref 0.2–1.2)
Total Protein: 6.2 g/dL (ref 6.1–8.1)

## 2018-02-03 LAB — LIPID PANEL
Cholesterol: 109 mg/dL (ref <200)
HDL: 39 mg/dL — ABNORMAL LOW (ref >40)
LDL Cholesterol (Calc): 45 mg/dL (calc)
Non-HDL Cholesterol (Calc): 70 mg/dL (calc) (ref <130)
Total CHOL/HDL Ratio: 2.8 (calc) (ref <5.0)
Triglycerides: 176 mg/dL — ABNORMAL HIGH (ref <150)

## 2018-02-03 LAB — MICROALBUMIN / CREATININE URINE RATIO
Creatinine, Urine: 128 mg/dL (ref 20–320)
Microalb Creat Ratio: 1047 mcg/mg creat — ABNORMAL HIGH (ref <30)
Microalb, Ur: 134 mg/dL

## 2018-02-03 LAB — HEMOGLOBIN A1C
Hgb A1c MFr Bld: 11.2 % of total Hgb — ABNORMAL HIGH (ref <5.7)
Mean Plasma Glucose: 275 (calc)
eAG (mmol/L): 15.2 (calc)

## 2018-02-06 ENCOUNTER — Ambulatory Visit (INDEPENDENT_AMBULATORY_CARE_PROVIDER_SITE_OTHER): Payer: Medicare Other | Admitting: Internal Medicine

## 2018-02-06 ENCOUNTER — Encounter: Payer: Self-pay | Admitting: Internal Medicine

## 2018-02-06 VITALS — BP 122/70 | HR 72 | Temp 98.6°F | Ht 70.0 in | Wt 255.6 lb

## 2018-02-06 DIAGNOSIS — E782 Mixed hyperlipidemia: Secondary | ICD-10-CM | POA: Diagnosis not present

## 2018-02-06 DIAGNOSIS — Z794 Long term (current) use of insulin: Secondary | ICD-10-CM

## 2018-02-06 DIAGNOSIS — I10 Essential (primary) hypertension: Secondary | ICD-10-CM | POA: Diagnosis not present

## 2018-02-06 DIAGNOSIS — R809 Proteinuria, unspecified: Secondary | ICD-10-CM | POA: Diagnosis not present

## 2018-02-06 DIAGNOSIS — Z6837 Body mass index (BMI) 37.0-37.9, adult: Secondary | ICD-10-CM

## 2018-02-06 DIAGNOSIS — E1129 Type 2 diabetes mellitus with other diabetic kidney complication: Secondary | ICD-10-CM | POA: Diagnosis not present

## 2018-02-06 DIAGNOSIS — I480 Paroxysmal atrial fibrillation: Secondary | ICD-10-CM | POA: Diagnosis not present

## 2018-02-06 NOTE — Progress Notes (Signed)
Patient ID: Seth [name], male   DOB: Jul 30, 1941, 77 y.o.   MRN: [id number]   Location:  [facility] OFFICE  Provider: DR Arletha Grippe  Code Status:  Goals of Care:  Advanced Directives 02/06/2018  Does Patient Have a Medical Advance Directive? Yes  Type of Advance Directive Living will  Does patient want to make changes to medical advance directive? -  Copy of [place] in Chart? -  Would patient like information on creating a medical advance directive? -  Pre-existing out of facility DNR order (yellow form or pink MOST form) -     Chief Complaint  Patient presents with  . Medical Management of Chronic Issues    3 month follow-up, discuss labs (copy given to patient)   . Medication Refill    No refills needed   . Immunizations    Discuss need for pneu and shingrix vaccines     HPI: Patient is a 77 y.o. male seen today for medical management of chronic diseases. He declines shingles vaccine. No need to repeat pneumovax. Reviewed lab results. He has some urinary incontinence.  DM - BS elevated at home 200 - 300.  No low BS reactions. He has neuropathy in feet. He is on toujeo 40 units (instead of 46 as he was instructed at last ov) daily, metformin, glipizide. A1c 11.2% (prev 10.2%). Urine microalbumin/Cr ratio 1,047 mcg/mg (prev. 806)  CAD/ICM/HTN/USA/HF - s/p 3V CABG. stable on ASA, coumadin, lipitor, coreg, lasix with KCL, lisinopril. No CP or SOB. Followed by cardio Dr Angelena Form. LDL 45; HDL 39; TG 176.  Nephro [facility] added amlodipine 21m daily. He experienced dizziness upon standing and dose reduced to 1/2 tab daily. He still has dizziness upon standing. No swelling in legs  PAD - stable on ASA and coumadin. INR 1.6. Followed by cardio coumadin clinic  PAF - rate controlled on coreg. On jantoven with most recent INR 1.6. Followed by cardio  Hx CKD - stable with nml Cr 0.93. [facility]. K 4.1; HCO3 level  31  Hx prostate CA - stable. Followed by urology Dr WJeffie Pollock PSA 0.74 in March 2018. He has frequent urinary urgency with associated leakage worse when he stands from seated position  Hyperlipidemia - stable on lipitor. LDL 45; HDL 39; TG 176. No myalgias  Anemia - resolved. Hgb 14.3  Past Medical History:  Diagnosis Date  . Atrial fibrillation (HCC)    a. coumadin;  b. Amiodarone  . CAD (coronary artery disease)    a.  Lexiscan Myoview (02/14/14):  Apical, inferolateral scar; ? Mild ischemia; EF 32%.;  b.  LHC (02/21/14):  dLM 100%, LAD 100%, CFX 100%, dRCA 50%. => CABG (L-LAD, S-OM, S-PDA)  . Chronic systolic heart failure ([facility]   . CKD (chronic kidney disease)    proteinuria  . Hemorrhage of gastrointestinal tract, unspecified   . Hemorrhage of rectum and anus   . HLD (hyperlipidemia)   . HTN (hypertension)   . Impotence of organic origin   . Ischemic cardiomyopathy    a. Echo (02/17/14):  EF 35-40%, anteroseptal and apical AK, Gr 2 DD, MAC, mod LAE.  .Marland KitchenNephrolithiasis   . Peripheral vascular disease, unspecified (HPotosi   . Prostate cancer ([place]   . Thrombocytopenia, unspecified ([place]   . Tobacco use disorder   . 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  BACK SURGERY    . CATARACT EXTRACTION  2010  . CLIPPING OF ATRIAL APPENDAGE Left 02/24/2014   Procedure: CLIPPING OF ATRIAL APPENDAGE;  Surgeon: Gaye Pollack, MD;  Location: [place];  Service: Open Heart Surgery;  Laterality: Left;  . COLONOSCOPY  2005   Normal  . CORONARY ARTERY BYPASS GRAFT N/A 02/24/2014   Procedure: Coronary artery bypass graft times three using left internal mammary artery and left leg greater saphenous vein harvested endoscopically.;  Surgeon: Gaye Pollack, MD;  [facility];  Service: Open Heart Surgery;  Laterality: N/A;  . INTRAOPERATIVE TRANSESOPHAGEAL ECHOCARDIOGRAM N/A 02/24/2014   Procedure:  INTRAOPERATIVE TRANSESOPHAGEAL ECHOCARDIOGRAM;  Surgeon: Gaye Pollack, MD;  Location: [facility] [facility] OR;  Service: Open Heart Surgery;  Laterality: N/A;  . LEFT HEART CATHETERIZATION WITH CORONARY ANGIOGRAM N/A 02/21/2014   Procedure: LEFT HEART CATHETERIZATION WITH CORONARY ANGIOGRAM;  Surgeon: Peter M Martinique, MD;  Location: [facility] CATH LAB;  Service: Cardiovascular;  Laterality: N/A;     reports that  has never smoked. He quit smokeless tobacco use about 3 years ago. His smokeless tobacco use included chew. He reports that he does not drink alcohol or use drugs. Social History   Socioeconomic History  . Marital status: Married    Spouse name: Not on file  . Number of children: 3  . Years of education: Not on file  . Highest education level: Not on file  Social Needs  . Financial resource strain: Not on file  . Food insecurity - worry: Not on file  . Food insecurity - inability: Not on file  . Transportation needs - medical: Not on file  . Transportation needs - non-medical: Not on file  Occupational History  . Occupation: Manufacturing engineer buildings  Tobacco Use  . Smoking status: Never Smoker  . Smokeless tobacco: Former Systems developer    Types: Chew  Substance and Sexual Activity  . Alcohol use: No  . Drug use: No  . Sexual activity: No  Other Topics Concern  . Not on file  Social History Narrative  . Not on file    Family History  Problem Relation Age of Onset  . Cancer Mother 14       leukemia  . Heart disease Father   . Heart attack Father 23  . Lung cancer Brother     No Known Allergies  Outpatient Encounter Medications as of 02/06/2018  Medication Sig  . amLODipine (NORVASC) 5 MG tablet Take 5 mg by mouth daily.   [name] aspirin EC 81 MG tablet Take 1 tablet (81 mg total) by mouth daily.  [name] atorvastatin (LIPITOR) 40 MG tablet TAKE 1 TABLET (40 MG TOTAL) BY MOUTH DAILY.  . Calcium Carbonate-Vitamin D 600-200 MG-UNIT TABS Take 1 tablet by mouth daily. For bones   . CARVEDILOL PO Take 25 mg by  mouth 2 (two) times daily with a meal.   . Flaxseed, Linseed, (FLAXSEED OIL PO) Take 1 tablet by mouth daily.   . furosemide (LASIX) 40 MG tablet Take two (2) tablets (61m) by mouth each morning and one (1) tablet (444m by mouth each evening.  . Marland KitchenlipiZIDE (GLUCOTROL) 5 MG tablet TAKE 2 TABLETS BY MOUTH IN THE MORNING AND 1 TABLET IN THE EVENING TO CONTROL BLOOD SUGAR  . Insulin Glargine (TOUJEO SOLOSTAR) 300 UNIT/ML SOPN Inject 40 Units daily into the skin. E11.43  . JANTOVEN 5 MG tablet TAKE ONE TABLET BY MOUTH ONE TIME DAILY OR AS DIRECTED BY COUMADIN CLINIC  . lisinopril (  PRINIVIL,ZESTRIL) 40 MG tablet Take 40 mg by mouth 2 (two) times daily. Per nephrology  . metFORMIN (GLUCOPHAGE) 1000 MG tablet TAKE ONE TABLET(1,000 MG TOTAL) BY MOUTH TWO TIMES DAILY WITH A MEAL.  . Multiple Vitamin (MULTIVITAMINS PO) Take 1 tablet by mouth daily.    . potassium chloride SA (K-DUR,KLOR-CON) 20 MEQ tablet TAKE ONE TABLET BY MOUTH ONE TIME DAILY    No facility-administered encounter medications on file as of 02/06/2018.     Review of Systems:  Review of Systems  Genitourinary: Positive for urgency.       Incontinence  Neurological: Positive for dizziness.  All other systems reviewed and are negative.   Health Maintenance  Topic Date Due  . OPHTHALMOLOGY EXAM  02/27/2018  . FOOT EXAM  07/25/2018  . HEMOGLOBIN A1C  08/02/2018  . COLONOSCOPY  06/02/2020  . TETANUS/TDAP  12/26/2020  . INFLUENZA VACCINE  Completed  . PNA vac Low Risk Adult  Completed    Physical Exam: Vitals:   02/06/18 0925  BP: 122/70  Pulse: 72  Temp: 98.6 F (37 C)  TempSrc: Oral  SpO2: 93%  Weight: 255 lb 9.6 oz (115.9 kg)  Height: 5' 10"  (1.778 m)   Body mass index is 36.67 kg/m. Physical Exam  Constitutional: He is oriented to person, place, and time. He appears well-developed and well-nourished.  HENT:  Mouth/Throat: Oropharynx is clear and moist.  MMM; no oral thrush  Eyes: Pupils are equal, round, and  reactive to light. No scleral icterus.  Neck: Neck supple. Carotid bruit is not present. No thyromegaly present.  Cardiovascular: Normal rate and intact distal pulses. An irregularly irregular rhythm present. Exam reveals no gallop and no friction rub.  Murmur heard.  Systolic murmur is present with a grade of 1/6. +1 pitting LE edema b/l. No calf TTP  Pulmonary/Chest: Effort normal and breath sounds normal. He has no wheezes. He has no rales. He exhibits no tenderness.  Abdominal: Soft. Normal appearance and bowel sounds are normal. He exhibits no distension, no abdominal bruit, no pulsatile midline mass and no mass. There is no hepatomegaly. There is no tenderness. There is no rigidity, no rebound and no guarding. No hernia.  obese  Musculoskeletal: He exhibits edema.  Lymphadenopathy:    He has no cervical adenopathy.  Neurological: He is alert and oriented to person, place, and time.  Skin: Skin is warm and dry. No rash noted.  Psychiatric: He has a normal mood and affect. His behavior is normal. Judgment and thought content normal.    Labs reviewed: Basic Metabolic Panel: Recent Labs    07/21/17 0835 10/23/17 0856 02/02/18 0828  NA 140 141 140  K 4.1 4.4 4.1  CL 105 102 101  CO2 22 30 31   GLUCOSE 274* 345* 307*  BUN 15 18 17   CREATININE 0.84 0.94 0.93  CALCIUM 8.5* 9.1 9.0  TSH 2.03  --   --    Liver Function Tests: Recent Labs    07/21/17 0835 10/23/17 0856 02/02/18 0828  AST 14 12 13   ALT 17 16 16   ALKPHOS 55  --   --   BILITOT 0.5 0.7 0.6  PROT 6.1 6.4 6.2  ALBUMIN 3.7  --   --    No results for input(s): LIPASE, AMYLASE in the last 8760 hours. No results for input(s): AMMONIA in the last 8760 hours. CBC: Recent Labs    07/21/17 0835  WBC 6.3  NEUTROABS 3,969  HGB 14.3  HCT 42.8  MCV 84.6  PLT 128*   Lipid Panel: Recent Labs    07/21/17 0835 02/02/18 0828  CHOL 101 109  HDL 38* 39*  LDLCALC 40  --   TRIG 116 176*  CHOLHDL 2.7 2.8   Lab  Results  Component Value Date   HGBA1C 11.2 (H) 02/02/2018    Procedures since last visit: No results found.  Assessment/Plan   ICD-10-CM   1. Type 2 diabetes mellitus with microalbuminuria, with long-term current use of insulin (HCC) E11.29 Hemoglobin A1c   R80.9 CMP with eGFR([facility])   Z79.4 Microalbumin/Creatinine Ratio, Urine  2. Class 2 severe obesity due to excess calories with serious comorbidity and body mass index (BMI) of 37.0 to 37.9 in adult ([place]) E66.01    Z68.37   3. Paroxysmal atrial fibrillation (HCC) I48.0   4. Essential hypertension I10   5. Mixed hyperlipidemia E78.2 Lipid Panel    Bring copy of [place] and/or Living Will to next appointment.   INCREASE TOUJEO TO 50 UNITS INJECTION DAILY FOR DIABETES  AVOID RED MEATS  Continue other medications as ordered  Follow up with cardiology coumadin clinic as scheduled  Follow up as scheduled with cardiology  Follow up in 3 mos for DM, HTN, CAD, PAF, hx prostate CA. fasting labs prior to appt   [place] S. Perlie Gold  [facility] and Adult Medicine [address] [place],  [address] [phone] Cell (Monday-Friday 8 AM - 5 PM) [phone] After 5 PM and follow prompts

## 2018-02-06 NOTE — Patient Instructions (Addendum)
Bring copy of [place] and/or Living Will to next appointment.   INCREASE TOUJEO TO 50 UNITS INJECTION DAILY FOR DIABETES  AVOID RED MEATS  Continue other medications as ordered  Follow up with cardiology coumadin clinic as scheduled  Follow up as scheduled with cardiology  Follow up in 3 mos for DM, HTN, CAD, PAF, hx prostate CA. fasting labs prior to appt

## 2018-02-13 ENCOUNTER — Ambulatory Visit (INDEPENDENT_AMBULATORY_CARE_PROVIDER_SITE_OTHER): Payer: Medicare Other | Admitting: *Deleted

## 2018-02-13 DIAGNOSIS — Z5181 Encounter for therapeutic drug level monitoring: Secondary | ICD-10-CM | POA: Diagnosis not present

## 2018-02-13 DIAGNOSIS — I4891 Unspecified atrial fibrillation: Secondary | ICD-10-CM | POA: Diagnosis not present

## 2018-02-13 LAB — POCT INR: INR: 2

## 2018-02-13 NOTE — Patient Instructions (Signed)
Description   Today take 2 tablets then continue taking 1.5 tablets everyday except 1 tablet on Saturdays. Recheck in 3 weeks. Call with any new medications [phone]

## 2018-03-01 ENCOUNTER — Other Ambulatory Visit: Payer: Self-pay | Admitting: Internal Medicine

## 2018-03-06 ENCOUNTER — Ambulatory Visit (INDEPENDENT_AMBULATORY_CARE_PROVIDER_SITE_OTHER): Payer: Medicare Other | Admitting: *Deleted

## 2018-03-06 DIAGNOSIS — Z5181 Encounter for therapeutic drug level monitoring: Secondary | ICD-10-CM

## 2018-03-06 DIAGNOSIS — I4891 Unspecified atrial fibrillation: Secondary | ICD-10-CM

## 2018-03-06 LAB — POCT INR: INR: 2

## 2018-03-06 NOTE — Patient Instructions (Signed)
Description   Today take 2 tablets then continue taking 1.5 tablets everyday except 1 tablet on Saturdays. Recheck in 4 weeks. Call with any new medications [phone]

## 2018-04-03 ENCOUNTER — Ambulatory Visit (INDEPENDENT_AMBULATORY_CARE_PROVIDER_SITE_OTHER): Payer: Medicare Other | Admitting: *Deleted

## 2018-04-03 DIAGNOSIS — Z5181 Encounter for therapeutic drug level monitoring: Secondary | ICD-10-CM

## 2018-04-03 DIAGNOSIS — I4891 Unspecified atrial fibrillation: Secondary | ICD-10-CM

## 2018-04-03 LAB — POCT INR: INR: 2

## 2018-04-03 NOTE — Patient Instructions (Signed)
Description   Change your dose to 1.5 tablets everyday. Recheck in 3 weeks. Call with any new medications [phone]

## 2018-04-24 ENCOUNTER — Ambulatory Visit (INDEPENDENT_AMBULATORY_CARE_PROVIDER_SITE_OTHER): Payer: Medicare Other | Admitting: Pharmacist

## 2018-04-24 DIAGNOSIS — I4891 Unspecified atrial fibrillation: Secondary | ICD-10-CM | POA: Diagnosis not present

## 2018-04-24 DIAGNOSIS — Z5181 Encounter for therapeutic drug level monitoring: Secondary | ICD-10-CM

## 2018-04-24 LAB — POCT INR: INR: 2.4

## 2018-04-24 NOTE — Patient Instructions (Signed)
Description   Change your dose to 1.5 tablets everyday. Recheck in 4 weeks. Call with any new medications [phone]

## 2018-04-30 ENCOUNTER — Other Ambulatory Visit: Payer: Self-pay | Admitting: Internal Medicine

## 2018-04-30 DIAGNOSIS — Z794 Long term (current) use of insulin: Secondary | ICD-10-CM

## 2018-04-30 DIAGNOSIS — E1143 Type 2 diabetes mellitus with diabetic autonomic (poly)neuropathy: Secondary | ICD-10-CM

## 2018-05-04 ENCOUNTER — Other Ambulatory Visit: Payer: Medicare Other

## 2018-05-04 DIAGNOSIS — Z794 Long term (current) use of insulin: Principal | ICD-10-CM

## 2018-05-04 DIAGNOSIS — E782 Mixed hyperlipidemia: Secondary | ICD-10-CM | POA: Diagnosis not present

## 2018-05-04 DIAGNOSIS — R809 Proteinuria, unspecified: Principal | ICD-10-CM

## 2018-05-04 DIAGNOSIS — E1129 Type 2 diabetes mellitus with other diabetic kidney complication: Secondary | ICD-10-CM

## 2018-05-05 LAB — COMPLETE METABOLIC PANEL WITH GFR
AG Ratio: 1.6 (calc) (ref 1.0–2.5)
ALT: 17 U/L (ref 9–46)
AST: 13 U/L (ref 10–35)
Albumin: 3.8 g/dL (ref 3.6–5.1)
Alkaline phosphatase (APISO): 57 U/L (ref 40–115)
BUN: 21 mg/dL (ref 7–25)
CO2: 30 mmol/L (ref 20–32)
Calcium: 9.4 mg/dL (ref 8.6–10.3)
Chloride: 103 mmol/L (ref 98–110)
Creat: 1.01 mg/dL (ref 0.70–1.18)
GFR, Est African American: 84 mL/min/{1.73_m2} (ref >=60)
GFR, Est Non African American: 72 mL/min/{1.73_m2} (ref >=60)
Globulin: 2.4 g/dL (calc) (ref 1.9–3.7)
Glucose, Bld: 309 mg/dL — ABNORMAL HIGH (ref 65–99)
Potassium: 4.3 mmol/L (ref 3.5–5.3)
Sodium: 140 mmol/L (ref 135–146)
Total Bilirubin: 0.6 mg/dL (ref 0.2–1.2)
Total Protein: 6.2 g/dL (ref 6.1–8.1)

## 2018-05-05 LAB — MICROALBUMIN / CREATININE URINE RATIO
Creatinine, Urine: 16 mg/dL — ABNORMAL LOW (ref 20–320)
Microalb Creat Ratio: 819 mcg/mg creat — ABNORMAL HIGH (ref <30)
Microalb, Ur: 13.1 mg/dL

## 2018-05-05 LAB — LIPID PANEL
Cholesterol: 106 mg/dL (ref <200)
HDL: 39 mg/dL — ABNORMAL LOW (ref >40)
LDL Cholesterol (Calc): 43 mg/dL (calc)
Non-HDL Cholesterol (Calc): 67 mg/dL (calc) (ref <130)
Total CHOL/HDL Ratio: 2.7 (calc) (ref <5.0)
Triglycerides: 153 mg/dL — ABNORMAL HIGH (ref <150)

## 2018-05-05 LAB — HEMOGLOBIN A1C
Hgb A1c MFr Bld: 11.9 % of total Hgb — ABNORMAL HIGH (ref <5.7)
Mean Plasma Glucose: 295 (calc)
eAG (mmol/L): 16.3 (calc)

## 2018-05-08 ENCOUNTER — Encounter: Payer: Self-pay | Admitting: Internal Medicine

## 2018-05-08 ENCOUNTER — Ambulatory Visit (INDEPENDENT_AMBULATORY_CARE_PROVIDER_SITE_OTHER): Payer: Medicare Other | Admitting: Internal Medicine

## 2018-05-08 VITALS — BP 124/60 | HR 63 | Temp 98.3°F | Resp 18 | Ht 70.0 in | Wt 254.8 lb

## 2018-05-08 DIAGNOSIS — Z6836 Body mass index (BMI) 36.0-36.9, adult: Secondary | ICD-10-CM | POA: Diagnosis not present

## 2018-05-08 DIAGNOSIS — Z79899 Other long term (current) drug therapy: Secondary | ICD-10-CM

## 2018-05-08 DIAGNOSIS — E782 Mixed hyperlipidemia: Secondary | ICD-10-CM

## 2018-05-08 DIAGNOSIS — I480 Paroxysmal atrial fibrillation: Secondary | ICD-10-CM

## 2018-05-08 DIAGNOSIS — I10 Essential (primary) hypertension: Secondary | ICD-10-CM | POA: Diagnosis not present

## 2018-05-08 DIAGNOSIS — Z794 Long term (current) use of insulin: Secondary | ICD-10-CM

## 2018-05-08 DIAGNOSIS — E1129 Type 2 diabetes mellitus with other diabetic kidney complication: Secondary | ICD-10-CM

## 2018-05-08 DIAGNOSIS — R809 Proteinuria, unspecified: Secondary | ICD-10-CM

## 2018-05-08 NOTE — Patient Instructions (Addendum)
INCREASE TOUJEO 60 UNITS DAILY as blood sugars are uncontrolled  Continue other medications as ordered  Follow up with specialists as scheduled  Follow up in 3 mos for DM, HTN, PAF, hyperlipidemia. Fasting labs prior to appt

## 2018-05-08 NOTE — Progress Notes (Signed)
Patient ID: Seth [name], male   DOB: 1941/09/02, 77 y.o.   MRN: [id number]   Location:  [facility] OFFICE  Provider: DR Arletha Grippe  Code Status: FULL CODE Goals of Care:  Advanced Directives 02/06/2018  Does Patient Have a Medical Advance Directive? Yes  Type of Advance Directive Living will  Does patient want to make changes to medical advance directive? -  Copy of [place] in Chart? -  Would patient like information on creating a medical advance directive? -  Pre-existing out of facility DNR order (yellow form or pink MOST form) -     Chief Complaint  Patient presents with  . Medical Management of Chronic Issues    3 month follow up for DM, HTN, CAD, PAF. No acute concerns at this time.   . Medication Refill    No refills needed at this time   . Results    Discuss labs  . Health Maintenance    Due for eye exam. Patient mentioned that he will go to his eye doctor for the exam.     HPI: Patient is a 77 y.o. male seen today for medical management of chronic diseases.    DM - he cannot recall BS readings and left diary at home.  No low BS reactions. He has neuropathy in feet. He is on toujeo 50 units daily, metformin, glipizide. A1c 11.9% (prev 11.2%). Urine microalbumin/Cr ratio 819 mcg/mg (prev. 1,047); LDL 43; fasting serum glucose 309  CAD/ICM/HTN/USA/HF - s/p 3V CABG. stable on ASA, coumadin, lipitor, coreg, lasix with KCL, lisinopril. No CP or SOB. Followed by cardio Dr Angelena Form. LDL 43; HDL 39; TG 153.  Nephro [facility] added amlodipine 105m daily. No swelling in legs; no dizziness  PAD - stable on ASA and coumadin. INR 2.4 (GOAL INR 2-3). Followed by cardio coumadin clinic  PAF - rate controlled on coreg. On jantoven with most recent INR 2.4 (GOAL INR 2-3). Followed by cardio  Hx CKD - stable with nml Cr 1.01. [facility]. K 4.1; HCO3 level 31  Hx prostate CA - stable. Followed by urology Dr  WJeffie Pollock PSA 0.74 in March 2018. He has frequent urinary urgency with associated leakage worse when he stands from seated position  Hyperlipidemia - stable on lipitor. LDL 43; HDL 39; TG 153. No myalgias   Past Medical History:  Diagnosis Date  . Atrial fibrillation (HCC)    a. coumadin;  b. Amiodarone  . CAD (coronary artery disease)    a.  Lexiscan Myoview (02/14/14):  Apical, inferolateral scar; ? Mild ischemia; EF 32%.;  b.  LHC (02/21/14):  dLM 100%, LAD 100%, CFX 100%, dRCA 50%. => CABG (L-LAD, S-OM, S-PDA)  . Chronic systolic heart failure ([facility]   . CKD (chronic kidney disease)    proteinuria  . Hemorrhage of gastrointestinal tract, unspecified   . Hemorrhage of rectum and anus   . HLD (hyperlipidemia)   . HTN (hypertension)   . Impotence of organic origin   . Ischemic cardiomyopathy    a. Echo (02/17/14):  EF 35-40%, anteroseptal and apical AK, Gr 2 DD, MAC, mod LAE.  .Marland KitchenNephrolithiasis   . Peripheral vascular disease, unspecified ([facility]   . Prostate cancer ([facility]   . Thrombocytopenia, unspecified ([place]   . Tobacco use disorder   . 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 BACK  SURGERY    . CATARACT EXTRACTION  2010  . CLIPPING OF ATRIAL APPENDAGE Left 02/24/2014   Procedure: CLIPPING OF ATRIAL APPENDAGE;  Surgeon: Gaye Pollack, MD;  Location: [place];  Service: Open Heart Surgery;  Laterality: Left;  . COLONOSCOPY  2005   Normal  . CORONARY ARTERY BYPASS GRAFT N/A 02/24/2014   Procedure: Coronary artery bypass graft times three using left internal mammary artery and left leg greater saphenous vein harvested endoscopically.;  Surgeon: Gaye Pollack, MD;  [facility] OR;  Service: Open Heart Surgery;  Laterality: N/A;  . INTRAOPERATIVE TRANSESOPHAGEAL ECHOCARDIOGRAM N/A 02/24/2014   Procedure: INTRAOPERATIVE TRANSESOPHAGEAL ECHOCARDIOGRAM;  Surgeon: Gaye Pollack, MD;   Location: [place] OR;  Service: Open Heart Surgery;  Laterality: N/A;  . LEFT HEART CATHETERIZATION WITH CORONARY ANGIOGRAM N/A 02/21/2014   Procedure: LEFT HEART CATHETERIZATION WITH CORONARY ANGIOGRAM;  Surgeon: Peter M Martinique, MD;  Location: [facility] CATH LAB;  Service: Cardiovascular;  Laterality: N/A;     reports that he has never smoked. He quit smokeless tobacco use about 4 years ago. His smokeless tobacco use included chew. He reports that he does not drink alcohol or use drugs. Social History   Socioeconomic History  . Marital status: Married    Spouse name: Not on file  . Number of children: 3  . Years of education: Not on file  . Highest education level: Not on file  Occupational History  . Occupation: Manufacturing engineer buildings  Social Needs  . Financial resource strain: Not on file  . Food insecurity:    Worry: Not on file    Inability: Not on file  . Transportation needs:    Medical: Not on file    Non-medical: Not on file  Tobacco Use  . Smoking status: Never Smoker  . Smokeless tobacco: Former Systems developer    Types: Chew  Substance and Sexual Activity  . Alcohol use: No  . Drug use: No  . Sexual activity: Never  Lifestyle  . Physical activity:    Days per week: Not on file    Minutes per session: Not on file  . Stress: Not on file  Relationships  . Social connections:    Talks on phone: Not on file    Gets together: Not on file    Attends religious service: Not on file    Active member of club or organization: Not on file    Attends meetings of clubs or organizations: Not on file    Relationship status: Not on file  . Intimate partner violence:    Fear of current or ex partner: Not on file    Emotionally abused: Not on file    Physically abused: Not on file    Forced sexual activity: Not on file  Other Topics Concern  . Not on file  Social History Narrative  . Not on file    Family History  Problem Relation Age of Onset  . Cancer Mother 50       leukemia  . Heart  disease Father   . Heart attack Father 10  . Lung cancer Brother     No Known Allergies  Outpatient Encounter Medications as of 05/08/2018  Medication Sig  . amLODipine (NORVASC) 5 MG tablet Take 5 mg by mouth daily.   [name] aspirin EC 81 MG tablet Take 1 tablet (81 mg total) by mouth daily.  [name] atorvastatin (LIPITOR) 40 MG tablet TAKE ONE TABLET BY MOUTH ONE TIME DAILY   . Calcium  Carbonate-Vitamin D 600-200 MG-UNIT TABS Take 1 tablet by mouth daily. For bones   . CARVEDILOL PO Take 25 mg by mouth 2 (two) times daily with a meal.   . Flaxseed, Linseed, (FLAXSEED OIL PO) Take 1 tablet by mouth daily.   . furosemide (LASIX) 40 MG tablet Take two (2) tablets (33m) by mouth each morning and one (1) tablet (468m by mouth each evening.  . Marland KitchenlipiZIDE (GLUCOTROL) 5 MG tablet TAKE 2 TABLETS BY MOUTH IN THE MORNING AND 1 TABLET IN THE EVENING TO CONTROL BLOOD SUGAR  . Insulin Glargine (TOUJEO MAX SOLOSTAR) 300 UNIT/ML SOPN Inject 60 Units into the skin daily.  . Marland KitchenANTOVEN 5 MG tablet TAKE ONE TABLET BY MOUTH ONE TIME DAILY OR AS DIRECTED BY COUMADIN CLINIC  . lisinopril (PRINIVIL,ZESTRIL) 40 MG tablet Take 40 mg by mouth 2 (two) times daily. Per nephrology  . metFORMIN (GLUCOPHAGE) 1000 MG tablet TAKE ONE TABLET BY MOUTH TWICE DAILY WITH MEALS  . Multiple Vitamin (MULTIVITAMINS PO) Take 1 tablet by mouth daily.    . potassium chloride SA (K-DUR,KLOR-CON) 20 MEQ tablet TAKE ONE TABLET BY MOUTH ONE TIME DAILY    No facility-administered encounter medications on file as of 05/08/2018.     Review of Systems:  Review of Systems  Musculoskeletal: Positive for arthralgias.  Neurological: Positive for numbness.  All other systems reviewed and are negative.   Health Maintenance  Topic Date Due  . OPHTHALMOLOGY EXAM  02/27/2018  . FOOT EXAM  07/25/2018  . INFLUENZA VACCINE  07/26/2018  . HEMOGLOBIN A1C  11/04/2018  . COLONOSCOPY  06/02/2020  . TETANUS/TDAP  12/26/2020  . PNA vac Low Risk Adult   Completed    Physical Exam: Vitals:   05/08/18 1007  BP: 124/60  Pulse: 63  Resp: 18  Temp: 98.3 F (36.8 C)  TempSrc: Oral  SpO2: 95%  Weight: 254 lb 12.8 oz (115.6 kg)  Height: _0  (1.778 m)   Body mass index is 36.56 kg/m. Physical Exam  Constitutional: He is oriented to person, place, and time. He appears well-developed and well-nourished.  HENT:  Mouth/Throat: Oropharynx is clear and moist.  MMM; no oral thrush  Eyes: Pupils are equal, round, and reactive to light. No scleral icterus.  Neck: Neck supple. Carotid bruit is not present. No thyromegaly present.  Cardiovascular: Normal rate, regular rhythm and intact distal pulses. Exam reveals no gallop and no friction rub.  Murmur (1/6 SEM) heard. Trace LE edema b/l. No calf TTP  Pulmonary/Chest: Effort normal and breath sounds normal. He has no wheezes. He has no rales. He exhibits no tenderness.  Abdominal: Soft. Bowel sounds are normal. He exhibits no distension, no abdominal bruit, no pulsatile midline mass and no mass. There is no hepatomegaly. There is no tenderness. There is no rebound and no guarding.  obese  Musculoskeletal: He exhibits edema (small joints).  Lymphadenopathy:    He has no cervical adenopathy.  Neurological: He is alert and oriented to person, place, and time. He has normal reflexes.  Skin: Skin is warm and dry. No rash noted.  Psychiatric: He has a normal mood and affect. His behavior is normal. Judgment and thought content normal.    Labs reviewed: Basic Metabolic Panel: Recent Labs    07/21/17 0835 10/23/17 0856 02/02/18 0828 05/04/18 0842  NA 140 141 140 140  K 4.1 4.4 4.1 4.3  CL 105 102 101 103  CO2 _1 GLUCOSE 274* 345* 307* 309*  BUN _0 CREATININE 0.84 0.94 0.93 1.01  CALCIUM 8.5* 9.1 9.0 9.4  TSH 2.03  --   --   --    Liver Function Tests: Recent Labs    07/21/17 0835 10/23/17 0856 02/02/18 0828 05/04/18 0842  AST _1 ALT _2 ALKPHOS 55  --   --   --   BILITOT 0.5 0.7 0.6 0.6  PROT 6.1 6.4 6.2 6.2  ALBUMIN 3.7  --   --   --    No results for input(s): LIPASE, AMYLASE in the last 8760 hours. No results for input(s): AMMONIA in the last 8760 hours. CBC: Recent Labs    07/21/17 0835  WBC 6.3  NEUTROABS 3,969  HGB 14.3  HCT 42.8  MCV 84.6  PLT 128*   Lipid Panel: Recent Labs    07/21/17 0835 02/02/18 0828 05/04/18 0842  CHOL 101 109 106  HDL 38* 39* 39*  LDLCALC 40 45 43  TRIG 116 176* 153*  CHOLHDL 2.7 2.8 2.7   Lab Results  Component Value Date   HGBA1C 11.9 (H) 05/04/2018    Procedures since last visit: No results found.  Assessment/Plan   ICD-10-CM   1. Type 2 diabetes mellitus with microalbuminuria, with long-term current use of insulin (HCC) E11.29 BMP with eGFR([facility])   R80.9 ALT   Z79.4 Hemoglobin A1c  2. Class 2 severe obesity due to excess calories with serious comorbidity and body mass index (BMI) of 36.0 to 36.9 in adult ([place]) E66.01    Z68.36   3. Paroxysmal atrial fibrillation (HCC) I48.0   4. Mixed hyperlipidemia E78.2 Lipid Panel  5. Essential hypertension I10   6. High risk medication use Z79.899      INCREASE TOUJEO 60 UNITS DAILY as blood sugars are uncontrolled  Continue other medications as ordered  Follow up with specialists as scheduled  Follow up in 3 mos for DM, HTN, PAF, hyperlipidemia. Fasting labs prior to appt   [place] S. Perlie Gold  [facility] [address] [place], [place] [address] [phone] Cell (Monday-Friday 8 AM - 5 PM) [phone] After 5 PM and follow prompts

## 2018-05-14 DIAGNOSIS — R32 Unspecified urinary incontinence: Secondary | ICD-10-CM | POA: Diagnosis not present

## 2018-05-14 DIAGNOSIS — E119 Type 2 diabetes mellitus without complications: Secondary | ICD-10-CM | POA: Diagnosis not present

## 2018-05-14 DIAGNOSIS — Z6841 Body Mass Index (BMI) 40.0 and over, adult: Secondary | ICD-10-CM | POA: Diagnosis not present

## 2018-05-14 DIAGNOSIS — R809 Proteinuria, unspecified: Secondary | ICD-10-CM | POA: Diagnosis not present

## 2018-05-14 DIAGNOSIS — I10 Essential (primary) hypertension: Secondary | ICD-10-CM | POA: Diagnosis not present

## 2018-05-23 ENCOUNTER — Other Ambulatory Visit: Payer: Self-pay | Admitting: Cardiovascular Disease

## 2018-05-23 ENCOUNTER — Other Ambulatory Visit: Payer: Self-pay | Admitting: Interventional Cardiology

## 2018-05-28 ENCOUNTER — Ambulatory Visit (INDEPENDENT_AMBULATORY_CARE_PROVIDER_SITE_OTHER): Payer: Medicare Other | Admitting: *Deleted

## 2018-05-28 ENCOUNTER — Ambulatory Visit: Payer: Medicare Other | Admitting: Cardiovascular Disease

## 2018-05-28 DIAGNOSIS — Z5181 Encounter for therapeutic drug level monitoring: Secondary | ICD-10-CM

## 2018-05-28 DIAGNOSIS — I4891 Unspecified atrial fibrillation: Secondary | ICD-10-CM

## 2018-05-28 LAB — POCT INR: INR: 2.1 (ref 2.0–3.0)

## 2018-05-28 NOTE — Patient Instructions (Addendum)
Description   Continue same dose of coumadin  1.5 tablets everyday. Recheck in 5 weeks. Call with any new medications [phone]

## 2018-06-26 NOTE — Progress Notes (Signed)
Chief Complaint  Patient presents with  . Follow-up    CAD    History of Present Illness: 77 yo male with a history of atrial fibrillation, DM, HTN, HLD, CAD s/p CABG, prostate CA, renal insufficiency and obesity here today for cardiac follow up. He was seen as a new patient 02/12/14 for evaluation of atrial fibrillation and was placed on Xarelto. Stress test was abnormal with apical and inferolateral scar and ischemia, LVEF of 32%. Echo 02/17/14 with LVEF=35-40% with akinesis of the mid-distalanteroseptal and apical myocardium, MAC, dilated LA. Cardiac cath 02/21/14 with occluded left main, moderate RCA stenosis with the entire left system filling from right to left collaterals. 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 Echo 05/28/14 with LVEF=35-40%, concentric LVH, mild MR. He was discharged on amiodarone but this has been stopped. He has been on coumadin.   He is here today for follow up. The patient denies any chest pain, dyspnea, palpitations, lower extremity edema, orthopnea, PND, dizziness, near syncope or syncope.   Primary Care Physician: Gildardo Cranker, DO  Past Medical History:  Diagnosis Date  . Atrial fibrillation (HCC)    a. coumadin;  b. Amiodarone  . CAD (coronary artery disease)    a.  Lexiscan Myoview (02/14/14):  Apical, inferolateral scar; ? Mild ischemia; EF 32%.;  b.  LHC (02/21/14):  dLM 100%, LAD 100%, CFX 100%, dRCA 50%. => CABG (L-LAD, S-OM, S-PDA)  . Chronic systolic heart failure ([place])   . CKD (chronic kidney disease)    proteinuria  . Hemorrhage of gastrointestinal tract, unspecified   . Hemorrhage of rectum and anus   . HLD (hyperlipidemia)   . HTN (hypertension)   . Impotence of organic origin   . Ischemic cardiomyopathy    a. Echo (02/17/14):  EF 35-40%, anteroseptal and apical AK, Gr 2 DD, MAC, mod LAE.  [name] Nephrolithiasis   . Peripheral vascular disease, unspecified ([place])   . Prostate cancer ([place])   . Thrombocytopenia,  unspecified ([place])   . Tobacco use disorder   . 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 BACK SURGERY    . CATARACT EXTRACTION  2010  . CLIPPING OF ATRIAL APPENDAGE Left 02/24/2014   Procedure: CLIPPING OF ATRIAL APPENDAGE;  Surgeon: Gaye Pollack, MD;  Location: [place];  Service: Open Heart Surgery;  Laterality: Left;  . COLONOSCOPY  2005   Normal  . CORONARY ARTERY BYPASS GRAFT N/A 02/24/2014   Procedure: Coronary artery bypass graft times three using left internal mammary artery and left leg greater saphenous vein harvested endoscopically.;  Surgeon: Gaye Pollack, MD;  Location[facility] OR;  Service: Open Heart Surgery;  Laterality: N/A;  . INTRAOPERATIVE TRANSESOPHAGEAL ECHOCARDIOGRAM N/A 02/24/2014   Procedure: INTRAOPERATIVE TRANSESOPHAGEAL ECHOCARDIOGRAM;  Surgeon: Gaye Pollack, MD;  Location: [place] OR;  Service: Open Heart Surgery;  Laterality: N/A;  . LEFT HEART CATHETERIZATION WITH CORONARY ANGIOGRAM N/A 02/21/2014   Procedure: LEFT HEART CATHETERIZATION WITH CORONARY ANGIOGRAM;  Surgeon: Peter M Martinique, MD;  Location: [facility] CATH LAB;  Service: Cardiovascular;  Laterality: N/A;    Current Outpatient Medications  Medication Sig Dispense Refill  . amLODipine (NORVASC) 5 MG tablet Take 5 mg by mouth daily.     [name] aspirin EC 81 MG tablet Take 1 tablet (81 mg total) by mouth daily.    [name] atorvastatin (LIPITOR) 40 MG tablet  TAKE ONE TABLET BY MOUTH ONE TIME DAILY  90 tablet 1  . Calcium Carbonate-Vitamin D 600-200 MG-UNIT TABS Take 1 tablet by mouth daily. For bones     . CARVEDILOL PO Take 25 mg by mouth 2 (two) times daily with a meal.     . Flaxseed, Linseed, (FLAXSEED OIL PO) Take 1 tablet by mouth daily.     . furosemide (LASIX) 40 MG tablet TAKE 2 TABLETS BY MOUTH EVERY MORNING AND 1 TABLET EVERY EVENING 90 tablet 0  . glipiZIDE (GLUCOTROL) 5 MG tablet TAKE 2 TABLETS BY MOUTH IN THE  MORNING AND 1 TABLET IN THE EVENING TO CONTROL BLOOD SUGAR 270 tablet 1  . Insulin Glargine (TOUJEO MAX SOLOSTAR) 300 UNIT/ML SOPN Inject 60 Units into the skin daily.    [name] JANTOVEN 5 MG tablet take 1 tablet by mouth once daily or as directed by coumadin clinic 45 tablet 3  . lisinopril (PRINIVIL,ZESTRIL) 40 MG tablet Take 40 mg by mouth 2 (two) times daily. Per nephrology 30 tablet 0  . metFORMIN (GLUCOPHAGE) 1000 MG tablet TAKE ONE TABLET BY MOUTH TWICE DAILY WITH MEALS 60 tablet 3  . Multiple Vitamin (MULTIVITAMINS PO) Take 1 tablet by mouth daily.      . potassium chloride SA (K-DUR,KLOR-CON) 20 MEQ tablet TAKE ONE TABLET BY MOUTH ONE TIME DAILY  30 tablet 3   No current facility-administered medications for this visit.     No Known Allergies  Social History   Socioeconomic History  . Marital status: Married    Spouse name: Not on file  . Number of children: 3  . Years of education: Not on file  . Highest education level: Not on file  Occupational History  . Occupation: Manufacturing engineer buildings  Social Needs  . Financial resource strain: Not on file  . Food insecurity:    Worry: Not on file    Inability: Not on file  . Transportation needs:    Medical: Not on file    Non-medical: Not on file  Tobacco Use  . Smoking status: Never Smoker  . Smokeless tobacco: Former Systems developer    Types: Chew  Substance and Sexual Activity  . Alcohol use: No  . Drug use: No  . Sexual activity: Never  Lifestyle  . Physical activity:    Days per week: Not on file    Minutes per session: Not on file  . Stress: Not on file  Relationships  . Social connections:    Talks on phone: Not on file    Gets together: Not on file    Attends religious service: Not on file    Active member of club or organization: Not on file    Attends meetings of clubs or organizations: Not on file    Relationship status: Not on file  . Intimate partner violence:    Fear of current or ex partner: Not on file     Emotionally abused: Not on file    Physically abused: Not on file    Forced sexual activity: Not on file  Other Topics Concern  . Not on file  Social History Narrative  . Not on file    Family History  Problem Relation Age of Onset  . Cancer Mother 12       leukemia  . Heart disease Father   . Heart attack Father 54  . Lung cancer Brother     Review of Systems:  As stated in the HPI and otherwise negative.  BP (!) 146/74   Pulse 61   Ht _0  (1.778 m)   Wt 254 lb 12.8 oz (115.6 kg)   SpO2 98%   BMI 36.56 kg/m   Physical Examination:  General: Well developed, well nourished, NAD  HEENT: OP clear, mucus membranes moist  SKIN: warm, dry. No rashes. Neuro: No focal deficits  Musculoskeletal: Muscle strength 5/5 all ext  Psychiatric: Mood and affect normal  Neck: No JVD, no carotid bruits, no thyromegaly, no lymphadenopathy.  Lungs:Clear bilaterally, no wheezes, rhonci, crackles Cardiovascular: Regular rate and rhythm. No murmurs, gallops or rubs. Abdomen:Soft. Bowel sounds present. Non-tender.  Extremities: No lower extremity edema. Pulses are 2 + in the bilateral DP/PT.  Echo 05/28/14: Left ventricle: The cavity size was normal. There was moderate concentric hypertrophy. Systolic function was moderately reduced. The estimated ejection fraction was in the range of 35% to 40%. There is akinesis of the mid-apicalanterior, inferoseptal, and apical myocardium. Features are consistent with a pseudonormal left ventricular filling pattern, with concomitant abnormal relaxation and increased filling pressure (grade 2 diastolic dysfunction). - Mitral valve: There was mild regurgitation. - Left atrium: The atrium was mildly to moderately dilated. - Right atrium: The atrium was mildly dilated.  EKG:  EKG is ordered today. The ekg ordered today demonstrates NSR, rate 61 bpm. Chronic T wave inversions lateral and anterolateral leads.   Recent Labs: 07/21/2017: Hemoglobin  14.3; Platelets 128; TSH 2.03 05/04/2018: ALT 17; BUN 21; Creat 1.01; Potassium 4.3; Sodium 140   Lipid Panel    Component Value Date/Time   CHOL 106 05/04/2018 0842   CHOL 101 06/08/2016 0843   TRIG 153 (H) 05/04/2018 0842   HDL 39 (L) 05/04/2018 0842   HDL 37 (L) 06/08/2016 0843   CHOLHDL 2.7 05/04/2018 0842   VLDL 23 07/21/2017 0835   LDLCALC 43 05/04/2018 0842     Wt Readings from Last 3 Encounters:  06/29/18 254 lb 12.8 oz (115.6 kg)  05/08/18 254 lb 12.8 oz (115.6 kg)  02/06/18 255 lb 9.6 oz (115.9 kg)     Other studies Reviewed: Additional studies/ records that were reviewed today include: . Review of the above records demonstrates:    Assessment and Plan:   1. CAD s/p CABG without angina: No chest pain. Will continue ASA, beta blocker and statin.    2. Atrial fibrillation, paroxysmal: He is in sinus today. Will continue beta blocker and coumadin.   3. Ischemic cardiomyopathy: LVEF=35-40% by echo June 2015. Continue beta blocker and Ace-inh.   4. Chronic systolic CHF: Volume status is ok. Continue Lasix.   5. HTN: BP is controlled at home. No changes.     6. HLD: LDL at goal. Continue statin.     Current medicines are reviewed at length with the patient today.  The patient does not have concerns regarding medicines.  The following changes have been made:  no change  Labs/ tests ordered today include:   No orders of the defined types were placed in this encounter.   Disposition:   FU with me in 12 months  Signed, Lauree Chandler, MD 06/29/2018 1:41 PM    [facility] [place], [place], [place]  [address] Phone: [phone]; Fax: [phone]

## 2018-06-29 ENCOUNTER — Ambulatory Visit (INDEPENDENT_AMBULATORY_CARE_PROVIDER_SITE_OTHER): Payer: Medicare Other | Admitting: *Deleted

## 2018-06-29 ENCOUNTER — Encounter: Payer: Self-pay | Admitting: Cardiovascular Disease

## 2018-06-29 ENCOUNTER — Ambulatory Visit (INDEPENDENT_AMBULATORY_CARE_PROVIDER_SITE_OTHER): Payer: Medicare Other | Admitting: Cardiovascular Disease

## 2018-06-29 VITALS — BP 146/74 | HR 61 | Ht 70.0 in | Wt 254.8 lb

## 2018-06-29 DIAGNOSIS — Z5181 Encounter for therapeutic drug level monitoring: Secondary | ICD-10-CM | POA: Diagnosis not present

## 2018-06-29 DIAGNOSIS — E7800 Pure hypercholesterolemia, unspecified: Secondary | ICD-10-CM | POA: Diagnosis not present

## 2018-06-29 DIAGNOSIS — I255 Ischemic cardiomyopathy: Secondary | ICD-10-CM | POA: Diagnosis not present

## 2018-06-29 DIAGNOSIS — I2510 Atherosclerotic heart disease of native coronary artery without angina pectoris: Secondary | ICD-10-CM | POA: Diagnosis not present

## 2018-06-29 DIAGNOSIS — I10 Essential (primary) hypertension: Secondary | ICD-10-CM | POA: Diagnosis not present

## 2018-06-29 DIAGNOSIS — I5022 Chronic systolic (congestive) heart failure: Secondary | ICD-10-CM | POA: Diagnosis not present

## 2018-06-29 DIAGNOSIS — I4891 Unspecified atrial fibrillation: Secondary | ICD-10-CM

## 2018-06-29 DIAGNOSIS — I480 Paroxysmal atrial fibrillation: Secondary | ICD-10-CM | POA: Diagnosis not present

## 2018-06-29 LAB — POCT INR: INR: 3.4 — AB (ref 2.0–3.0)

## 2018-06-29 NOTE — Patient Instructions (Signed)

## 2018-06-29 NOTE — Patient Instructions (Signed)
Description   Do not take any Coumadin today then continue same dose of coumadin  1.5 tablets everyday. Recheck in 4 weeks. Call with any new medications [phone]

## 2018-07-02 ENCOUNTER — Other Ambulatory Visit: Payer: Self-pay | Admitting: Cardiovascular Disease

## 2018-07-30 ENCOUNTER — Other Ambulatory Visit: Payer: Self-pay | Admitting: Internal Medicine

## 2018-07-30 DIAGNOSIS — E1143 Type 2 diabetes mellitus with diabetic autonomic (poly)neuropathy: Secondary | ICD-10-CM

## 2018-08-03 ENCOUNTER — Other Ambulatory Visit: Payer: Medicare Other

## 2018-08-03 ENCOUNTER — Encounter: Payer: Self-pay | Admitting: Internal Medicine

## 2018-08-03 ENCOUNTER — Ambulatory Visit (INDEPENDENT_AMBULATORY_CARE_PROVIDER_SITE_OTHER): Payer: Medicare Other

## 2018-08-03 DIAGNOSIS — E782 Mixed hyperlipidemia: Secondary | ICD-10-CM

## 2018-08-03 DIAGNOSIS — Z5181 Encounter for therapeutic drug level monitoring: Secondary | ICD-10-CM

## 2018-08-03 DIAGNOSIS — I4891 Unspecified atrial fibrillation: Secondary | ICD-10-CM | POA: Diagnosis not present

## 2018-08-03 DIAGNOSIS — R809 Proteinuria, unspecified: Secondary | ICD-10-CM | POA: Diagnosis not present

## 2018-08-03 DIAGNOSIS — Z794 Long term (current) use of insulin: Principal | ICD-10-CM

## 2018-08-03 DIAGNOSIS — E1129 Type 2 diabetes mellitus with other diabetic kidney complication: Secondary | ICD-10-CM

## 2018-08-03 LAB — POCT INR: INR: 3.5 — AB (ref 2.0–3.0)

## 2018-08-03 NOTE — Patient Instructions (Signed)
Description   Skip today's dosage of Coumadin, then start taking 1.5 tablets daily except 1 tablet on Mondays. Recheck in 3 weeks. Call with any new medications [phone]

## 2018-08-04 LAB — HEMOGLOBIN A1C
Hgb A1c MFr Bld: 11.5 % of total Hgb — ABNORMAL HIGH (ref <5.7)
Mean Plasma Glucose: 283 (calc)
eAG (mmol/L): 15.7 (calc)

## 2018-08-04 LAB — LIPID PANEL
Cholesterol: 103 mg/dL (ref <200)
HDL: 36 mg/dL — ABNORMAL LOW (ref >40)
LDL Cholesterol (Calc): 41 mg/dL (calc)
Non-HDL Cholesterol (Calc): 67 mg/dL (calc) (ref <130)
Total CHOL/HDL Ratio: 2.9 (calc) (ref <5.0)
Triglycerides: 187 mg/dL — ABNORMAL HIGH (ref <150)

## 2018-08-04 LAB — BASIC METABOLIC PANEL WITH GFR
BUN: 19 mg/dL (ref 7–25)
CO2: 29 mmol/L (ref 20–32)
Calcium: 9 mg/dL (ref 8.6–10.3)
Chloride: 104 mmol/L (ref 98–110)
Creat: 0.96 mg/dL (ref 0.70–1.18)
GFR, Est African American: 89 mL/min/{1.73_m2} (ref >=60)
GFR, Est Non African American: 76 mL/min/{1.73_m2} (ref >=60)
Glucose, Bld: 293 mg/dL — ABNORMAL HIGH (ref 65–99)
Potassium: 4.2 mmol/L (ref 3.5–5.3)
Sodium: 140 mmol/L (ref 135–146)

## 2018-08-04 LAB — ALT: ALT: 16 U/L (ref 9–46)

## 2018-08-07 ENCOUNTER — Ambulatory Visit (INDEPENDENT_AMBULATORY_CARE_PROVIDER_SITE_OTHER): Payer: Medicare Other | Admitting: Internal Medicine

## 2018-08-07 ENCOUNTER — Ambulatory Visit: Payer: Medicare Other | Admitting: Internal Medicine

## 2018-08-07 ENCOUNTER — Encounter: Payer: Self-pay | Admitting: Internal Medicine

## 2018-08-07 VITALS — BP 146/66 | HR 70 | Temp 98.1°F | Resp 18 | Ht 70.0 in | Wt 254.6 lb

## 2018-08-07 DIAGNOSIS — Z6836 Body mass index (BMI) 36.0-36.9, adult: Secondary | ICD-10-CM | POA: Diagnosis not present

## 2018-08-07 DIAGNOSIS — E782 Mixed hyperlipidemia: Secondary | ICD-10-CM | POA: Diagnosis not present

## 2018-08-07 DIAGNOSIS — I255 Ischemic cardiomyopathy: Secondary | ICD-10-CM

## 2018-08-07 DIAGNOSIS — R809 Proteinuria, unspecified: Secondary | ICD-10-CM

## 2018-08-07 DIAGNOSIS — I739 Peripheral vascular disease, unspecified: Secondary | ICD-10-CM | POA: Diagnosis not present

## 2018-08-07 DIAGNOSIS — Z79899 Other long term (current) drug therapy: Secondary | ICD-10-CM

## 2018-08-07 DIAGNOSIS — Z794 Long term (current) use of insulin: Secondary | ICD-10-CM | POA: Diagnosis not present

## 2018-08-07 DIAGNOSIS — I480 Paroxysmal atrial fibrillation: Secondary | ICD-10-CM

## 2018-08-07 DIAGNOSIS — E1129 Type 2 diabetes mellitus with other diabetic kidney complication: Secondary | ICD-10-CM

## 2018-08-07 DIAGNOSIS — I10 Essential (primary) hypertension: Secondary | ICD-10-CM

## 2018-08-07 NOTE — Patient Instructions (Addendum)
PLEASE SCHEDULE APPT WITH UROLOGY DR Jeffie Pollock TO FOLLOW UP ON HISTORY OF PROSTATE CANCER - last seen in April 2018!  Will call with vascular appt to check circulation  Continue all medications as ordered   AVOID COMPLEX CARBS AND CHOCOLATE DUE TO ELEVATED BLOOD SUGARS  Follow up with cardiology as scheduled  Follow up in 3 mos with Janett Billow for DM, HTN, PAF, hyperlipidemia. Fasting labs prior to appt

## 2018-08-07 NOTE — Progress Notes (Signed)
Patient ID: Seth [name], male   DOB: 07/13/1941, 77 y.o.   MRN: [id number]   Location:  [facility] OFFICE  Provider: DR Arletha Grippe  Code Status:  Goals of Care:  Advanced Directives 02/06/2018  Does Patient Have a Medical Advance Directive? Yes  Type of Advance Directive Living will  Does patient want to make changes to medical advance directive? -  Copy of [place] in Chart? -  Would patient like information on creating a medical advance directive? -  Pre-existing out of facility DNR order (yellow form or pink MOST form) -     Chief Complaint  Patient presents with  . Follow-up    needs foot and eye exam    HPI: Patient is a 77 y.o. male seen today for medical management of chronic diseases.  BS elevated at home - fasting 210 this AM. He was away on vacation in June 2019 x 30 days and almost ran out of insulin. He was using toujeo 40 units for 30 days. (+) numbness in feet. He has trouble urinating and has incontinence at times. He has not f/u with urology yet.   DM - he cannot recall BS readings and left diary at home.  No low BS reactions. He has neuropathy in feet. He is on toujeo 60 units daily, metformin, glipizide. A1c 11.5% (prev 11.9%). Urine microalbumin/Cr ratio 819 mcg/mg (prev. 1,047); LDL 41; fasting serum glucose 293  CAD/ICM/HTN/USA/HF - s/p 3V CABG. stable on ASA, coumadin, lipitor, coreg, lasix with KCL, lisinopril. No CP or SOB. Followed by cardio Dr Angelena Form. LDL 41; HDL 36; TG 187.  Nephro [facility] added amlodipine 63m daily. No swelling in legs; no dizziness. INR 3.5. No bleeding or easy bruising  PAD - stable on ASA and coumadin. INR 3.5 (GOAL INR 2-3). Followed by cardio coumadin clinic  PAF - rate controlled on coreg. On jantoven with most recent INR 3.5 (GOAL INR 2-3). Followed by cardio  Hx CKD - stable with nml Cr 0.96. [facility]. K 4.2; HCO3 level 29  Hx prostate CA - stable.  Followed by urology Dr WJeffie Pollock PSA 0.74 in March 2018. He has frequent urinary urgency with associated leakage worse when he stands from seated position  Hyperlipidemia - stable on lipitor. LDL 43; HDL 39; TG 153. No myalgias   Past Medical History:  Diagnosis Date  . Atrial fibrillation (HCC)    a. coumadin;  b. Amiodarone  . CAD (coronary artery disease)    a.  Lexiscan Myoview (02/14/14):  Apical, inferolateral scar; ? Mild ischemia; EF 32%.;  b.  LHC (02/21/14):  dLM 100%, LAD 100%, CFX 100%, dRCA 50%. => CABG (L-LAD, S-OM, S-PDA)  . Chronic systolic heart failure ([facility] [employer]   . CKD (chronic kidney disease)    proteinuria  . Hemorrhage of gastrointestinal tract, unspecified   . Hemorrhage of rectum and anus   . HLD (hyperlipidemia)   . HTN (hypertension)   . Impotence of organic origin   . Ischemic cardiomyopathy    a. Echo (02/17/14):  EF 35-40%, anteroseptal and apical AK, Gr 2 DD, MAC, mod LAE.  .Marland KitchenNephrolithiasis   . Peripheral vascular disease, unspecified ([place]   . Prostate cancer ([place]   . Thrombocytopenia, unspecified ([place]   . Tobacco use disorder   . 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 BACK SURGERY    .  CATARACT EXTRACTION  2010  . CLIPPING OF ATRIAL APPENDAGE Left 02/24/2014   Procedure: CLIPPING OF ATRIAL APPENDAGE;  Surgeon: Gaye Pollack, MD;  Location: [place];  Service: Open Heart Surgery;  Laterality: Left;  . COLONOSCOPY  2005   Normal  . CORONARY ARTERY BYPASS GRAFT N/A 02/24/2014   Procedure: Coronary artery bypass graft times three using left internal mammary artery and left leg greater saphenous vein harvested endoscopically.;  Surgeon: Gaye Pollack, MD;  [facility];  Service: Open Heart Surgery;  Laterality: N/A;  . INTRAOPERATIVE TRANSESOPHAGEAL ECHOCARDIOGRAM N/A 02/24/2014   Procedure: INTRAOPERATIVE TRANSESOPHAGEAL ECHOCARDIOGRAM;  Surgeon:  Gaye Pollack, MD;  Location: [place] OR;  Service: Open Heart Surgery;  Laterality: N/A;  . LEFT HEART CATHETERIZATION WITH CORONARY ANGIOGRAM N/A 02/21/2014   Procedure: LEFT HEART CATHETERIZATION WITH CORONARY ANGIOGRAM;  Surgeon: Peter M Martinique, MD;  Location: [facility] CATH LAB;  Service: Cardiovascular;  Laterality: N/A;     reports that he has never smoked. He quit smokeless tobacco use about 4 years ago.  His smokeless tobacco use included chew. He reports that he does not drink alcohol or use drugs. Social History   Socioeconomic History  . Marital status: Married    Spouse name: Not on file  . Number of children: 3  . Years of education: Not on file  . Highest education level: Not on file  Occupational History  . Occupation: Manufacturing engineer buildings  Social Needs  . Financial resource strain: Not on file  . Food insecurity:    Worry: Not on file    Inability: Not on file  . Transportation needs:    Medical: Not on file    Non-medical: Not on file  Tobacco Use  . Smoking status: Never Smoker  . Smokeless tobacco: Former Systems developer    Types: Chew  Substance and Sexual Activity  . Alcohol use: No  . Drug use: No  . Sexual activity: Never  Lifestyle  . Physical activity:    Days per week: Not on file    Minutes per session: Not on file  . Stress: Not on file  Relationships  . Social connections:    Talks on phone: Not on file    Gets together: Not on file    Attends religious service: Not on file    Active member of club or organization: Not on file    Attends meetings of clubs or organizations: Not on file    Relationship status: Not on file  . Intimate partner violence:    Fear of current or ex partner: Not on file    Emotionally abused: Not on file    Physically abused: Not on file    Forced sexual activity: Not on file  Other Topics Concern  . Not on file  Social History Narrative  . Not on file    Family History  Problem Relation Age of Onset  . Cancer Mother 62        leukemia  . Heart disease Father   . Heart attack Father 25  . Lung cancer Brother     No Known Allergies  Outpatient Encounter Medications as of 08/07/2018  Medication Sig  . amLODipine (NORVASC) 5 MG tablet Take 5 mg by mouth daily.   [name] aspirin EC 81 MG tablet Take 1 tablet (81 mg total) by mouth daily.  [name] atorvastatin (LIPITOR) 40 MG tablet TAKE ONE TABLET BY MOUTH ONE TIME DAILY   . Calcium Carbonate-Vitamin D 600-200 MG-UNIT  TABS Take 1 tablet by mouth daily. For bones   . CARVEDILOL PO Take 25 mg by mouth 2 (two) times daily with a meal.   . Flaxseed, Linseed, (FLAXSEED OIL PO) Take 1 tablet by mouth daily.   . furosemide (LASIX) 40 MG tablet TAKE TWO TABLETS BY MOUTH IN THE MORNING AND ONE EVERY EVENING  . glipiZIDE (GLUCOTROL) 5 MG tablet Take 2 tablets by mouth in the morning and 1 tablet in the evening to control blood sugar  . Insulin Glargine (TOUJEO MAX SOLOSTAR) 300 UNIT/ML SOPN Inject 60 Units into the skin daily.  [name] JANTOVEN 5 MG tablet take 1 tablet by mouth once daily or as directed by coumadin clinic  . lisinopril (PRINIVIL,ZESTRIL) 40 MG tablet Take 40 mg by mouth 2 (two) times daily. Per nephrology  . metFORMIN (GLUCOPHAGE) 1000 MG tablet TAKE ONE TABLET BY MOUTH TWICE DAILY WITH MEALS  . Multiple Vitamin (MULTIVITAMINS PO) Take 1 tablet by mouth daily.    . potassium chloride SA (K-DUR,KLOR-CON) 20 MEQ tablet TAKE ONE TABLET BY MOUTH ONE TIME DAILY    No facility-administered encounter medications on file as of 08/07/2018.     Review of Systems:  Review of Systems  Genitourinary: Positive for difficulty urinating.  Neurological: Positive for numbness.  All other systems reviewed and are negative.   Health Maintenance  Topic Date Due  . OPHTHALMOLOGY EXAM  02/27/2018  . FOOT EXAM  07/25/2018  . INFLUENZA VACCINE  07/26/2018  . HEMOGLOBIN A1C  02/03/2019  . TETANUS/TDAP  12/26/2020  . PNA vac Low Risk Adult  Completed    Physical Exam: Vitals:    08/07/18 0839  BP: (!) 146/66  Pulse: 70  Resp: 18  Temp: 98.1 F (36.7 C)  TempSrc: Oral  SpO2: 94%  Weight: 254 lb 9.6 oz (115.5 kg)  Height: _0  (1.778 m)   Body mass index is 36.53 kg/m. Physical Exam  Constitutional: He is oriented to person, place, and time. He appears well-developed and well-nourished.  HENT:  Mouth/Throat: Oropharynx is clear and moist.  MMM; no oral thrush  Eyes: Pupils are equal, round, and reactive to light. No scleral icterus.  Neck: Neck supple. Carotid bruit is not present. No thyromegaly present.  Cardiovascular: Normal rate and intact distal pulses. An irregularly irregular rhythm present. Exam reveals no gallop and no friction rub.  Murmur (1/6 SEM) heard. Pulses:      Dorsalis pedis pulses are 1+ on the right side, and 1+ on the left side.       Posterior tibial pulses are 2+ on the right side, and 1+ on the left side.  +1 pitting LE edema b/l. No calf TTP  Pulmonary/Chest: Effort normal and breath sounds normal. He has no wheezes. He has no rales. He exhibits no tenderness.  Abdominal: Soft. Bowel sounds are normal. He exhibits distension. He exhibits no abdominal bruit, no pulsatile midline mass and no mass. There is no hepatomegaly. There is no tenderness. There is no rebound and no guarding.  obese  Musculoskeletal: He exhibits edema.  Lymphadenopathy:    He has no cervical adenopathy.  Neurological: He is alert and oriented to person, place, and time. He has normal reflexes.  Skin: Skin is warm and dry. Rash (pedal tinea) noted.  Psychiatric: He has a normal mood and affect. His behavior is normal. Judgment and thought content normal.   Diabetic Foot Exam - Simple   Simple Foot Form Diabetic Foot exam was performed with the  following findings:  Yes 08/07/2018  9:30 AM  Visual Inspection See comments:  Yes Sensation Testing See comments:  Yes Pulse Check See comments:  Yes Comments Toenail hypertrophy noted b/l with dystrophic  nails; tinea noted on medial foot with no secondary signs of infection; reduced monofilament testing R>L; decreased DP pulse b/l      Labs reviewed: Basic Metabolic Panel: Recent Labs    02/02/18 0828 05/04/18 0842 08/03/18 0838  NA 140 140 140  K 4.1 4.3 4.2  CL 101 103 104  CO2 _0 GLUCOSE 307* 309* 293*  BUN _1 CREATININE 0.93 1.01 0.96  CALCIUM 9.0 9.4 9.0   Liver Function Tests: Recent Labs    10/23/17 0856 02/02/18 0828 05/04/18 0842 08/03/18 0838  AST _2 --   ALT _3 BILITOT 0.7 0.6 0.6  --   PROT 6.4 6.2 6.2  --    No results for input(s): LIPASE, AMYLASE in the last 8760 hours. No results for input(s): AMMONIA in the last 8760 hours. CBC: No results for input(s): WBC, NEUTROABS, HGB, HCT, MCV, PLT in the last 8760 hours. Lipid Panel: Recent Labs    02/02/18 0828 05/04/18 0842 08/03/18 0838  CHOL 109 106 103  HDL 39* 39* 36*  LDLCALC 45 43 41  TRIG 176* 153* 187*  CHOLHDL 2.8 2.7 2.9   Lab Results  Component Value Date   HGBA1C 11.5 (H) 08/03/2018    Procedures since last visit: No results found.  Assessment/Plan   ICD-10-CM   1. PVD (peripheral vascular disease) (HCC) I73.9 VAS [place] ABI WITH/WO TBI  2. Type 2 diabetes mellitus with microalbuminuria, with long-term current use of insulin (HCC) E11.29 CMP with eGFR([facility])   R80.9 Hemoglobin A1c   Z79.4 Microalbumin/Creatinine Ratio, Urine  3. Class 2 severe obesity due to excess calories with serious comorbidity and body mass index (BMI) of 36.0 to 36.9 in adult (HCC) E66.01    Z68.36   4. Essential hypertension I10   5. Mixed hyperlipidemia E78.2 Lipid Panel  6. Paroxysmal atrial fibrillation (HCC) I48.0   7. High risk medication use Z79.899    PLEASE SCHEDULE APPT WITH UROLOGY DR Jeffie Pollock TO FOLLOW UP ON HISTORY OF PROSTATE CANCER - last seen in April 2018!  Will call with vascular appt to check circulation  Continue all medications as ordered   AVOID COMPLEX  CARBS AND CHOCOLATE DUE TO ELEVATED BLOOD SUGARS  Follow up with cardiology as scheduled  Follow up in 3 mos with Seth [name] for DM, HTN, PAF, hyperlipidemia. Fasting labs prior to appt    [place] S. Perlie Gold  [facility] [address] [place],  [address] [phone] Cell (Monday-Friday 8 AM - 5 PM) [phone] After 5 PM and follow prompts

## 2018-08-15 ENCOUNTER — Encounter: Payer: Self-pay | Admitting: Internal Medicine

## 2018-08-16 ENCOUNTER — Encounter (HOSPITAL_COMMUNITY): Payer: Medicare Other

## 2018-08-21 ENCOUNTER — Other Ambulatory Visit: Payer: Self-pay

## 2018-08-21 ENCOUNTER — Other Ambulatory Visit: Payer: Self-pay | Admitting: Internal Medicine

## 2018-08-21 DIAGNOSIS — E1143 Type 2 diabetes mellitus with diabetic autonomic (poly)neuropathy: Secondary | ICD-10-CM

## 2018-08-21 DIAGNOSIS — E1129 Type 2 diabetes mellitus with other diabetic kidney complication: Secondary | ICD-10-CM

## 2018-08-21 DIAGNOSIS — Z794 Long term (current) use of insulin: Principal | ICD-10-CM

## 2018-08-21 DIAGNOSIS — E782 Mixed hyperlipidemia: Secondary | ICD-10-CM

## 2018-08-21 DIAGNOSIS — R809 Proteinuria, unspecified: Principal | ICD-10-CM

## 2018-08-24 ENCOUNTER — Ambulatory Visit (INDEPENDENT_AMBULATORY_CARE_PROVIDER_SITE_OTHER): Payer: Medicare Other | Admitting: Pharmacist

## 2018-08-24 DIAGNOSIS — I4891 Unspecified atrial fibrillation: Secondary | ICD-10-CM

## 2018-08-24 DIAGNOSIS — Z5181 Encounter for therapeutic drug level monitoring: Secondary | ICD-10-CM

## 2018-08-24 LAB — POCT INR: INR: 2.3 (ref 2.0–3.0)

## 2018-08-24 NOTE — Patient Instructions (Signed)
Description   Continue taking 1.5 tablets daily except 1 tablet on Mondays. Recheck in 4 weeks. Call with any new medications [phone]

## 2018-09-03 ENCOUNTER — Other Ambulatory Visit: Payer: Self-pay | Admitting: Internal Medicine

## 2018-09-21 ENCOUNTER — Ambulatory Visit (INDEPENDENT_AMBULATORY_CARE_PROVIDER_SITE_OTHER): Payer: Medicare Other | Admitting: *Deleted

## 2018-09-21 DIAGNOSIS — Z5181 Encounter for therapeutic drug level monitoring: Secondary | ICD-10-CM | POA: Diagnosis not present

## 2018-09-21 DIAGNOSIS — I4891 Unspecified atrial fibrillation: Secondary | ICD-10-CM | POA: Diagnosis not present

## 2018-09-21 LAB — POCT INR: INR: 3 (ref 2.0–3.0)

## 2018-09-21 NOTE — Patient Instructions (Signed)
Continue taking 1.5 tablets daily except 1 tablet on Mondays. Recheck in 4 weeks. Call with any new medications [phone]

## 2018-10-01 ENCOUNTER — Other Ambulatory Visit: Payer: Self-pay | Admitting: Cardiovascular Disease

## 2018-10-19 ENCOUNTER — Ambulatory Visit (INDEPENDENT_AMBULATORY_CARE_PROVIDER_SITE_OTHER): Payer: Medicare Other | Admitting: Pharmacist

## 2018-10-19 DIAGNOSIS — Z5181 Encounter for therapeutic drug level monitoring: Secondary | ICD-10-CM | POA: Diagnosis not present

## 2018-10-19 DIAGNOSIS — I4891 Unspecified atrial fibrillation: Secondary | ICD-10-CM | POA: Diagnosis not present

## 2018-10-19 LAB — POCT INR: INR: 2.4 (ref 2.0–3.0)

## 2018-10-19 NOTE — Patient Instructions (Signed)
Description   Continue taking 1.5 tablets daily except 1 tablet on Mondays. Recheck in 6 weeks. Call with any new medications [phone]

## 2018-11-02 ENCOUNTER — Other Ambulatory Visit: Payer: Medicare Other

## 2018-11-02 DIAGNOSIS — R809 Proteinuria, unspecified: Secondary | ICD-10-CM | POA: Diagnosis not present

## 2018-11-02 DIAGNOSIS — E1129 Type 2 diabetes mellitus with other diabetic kidney complication: Secondary | ICD-10-CM | POA: Diagnosis not present

## 2018-11-02 DIAGNOSIS — Z794 Long term (current) use of insulin: Secondary | ICD-10-CM | POA: Diagnosis not present

## 2018-11-02 DIAGNOSIS — E782 Mixed hyperlipidemia: Secondary | ICD-10-CM

## 2018-11-03 LAB — COMPLETE METABOLIC PANEL WITH GFR
AG Ratio: 1.3 (calc) (ref 1.0–2.5)
ALT: 18 U/L (ref 9–46)
AST: 14 U/L (ref 10–35)
Albumin: 3.6 g/dL (ref 3.6–5.1)
Alkaline phosphatase (APISO): 56 U/L (ref 40–115)
BUN: 18 mg/dL (ref 7–25)
CO2: 31 mmol/L (ref 20–32)
Calcium: 9 mg/dL (ref 8.6–10.3)
Chloride: 102 mmol/L (ref 98–110)
Creat: 1 mg/dL (ref 0.70–1.18)
GFR, Est African American: 84 mL/min/{1.73_m2} (ref >=60)
GFR, Est Non African American: 73 mL/min/{1.73_m2} (ref >=60)
Globulin: 2.7 g/dL (calc) (ref 1.9–3.7)
Glucose, Bld: 276 mg/dL — ABNORMAL HIGH (ref 65–99)
Potassium: 3.9 mmol/L (ref 3.5–5.3)
Sodium: 140 mmol/L (ref 135–146)
Total Bilirubin: 0.7 mg/dL (ref 0.2–1.2)
Total Protein: 6.3 g/dL (ref 6.1–8.1)

## 2018-11-03 LAB — LIPID PANEL
Cholesterol: 98 mg/dL (ref <200)
HDL: 34 mg/dL — ABNORMAL LOW (ref >40)
LDL Cholesterol (Calc): 39 mg/dL (calc)
Non-HDL Cholesterol (Calc): 64 mg/dL (calc) (ref <130)
Total CHOL/HDL Ratio: 2.9 (calc) (ref <5.0)
Triglycerides: 167 mg/dL — ABNORMAL HIGH (ref <150)

## 2018-11-03 LAB — MICROALBUMIN / CREATININE URINE RATIO
Creatinine, Urine: 38 mg/dL (ref 20–320)
Microalb Creat Ratio: 897 mcg/mg creat — ABNORMAL HIGH (ref <30)
Microalb, Ur: 34.1 mg/dL

## 2018-11-03 LAB — HEMOGLOBIN A1C
Hgb A1c MFr Bld: 11.3 % of total Hgb — ABNORMAL HIGH (ref <5.7)
Mean Plasma Glucose: 278 (calc)
eAG (mmol/L): 15.4 (calc)

## 2018-11-05 ENCOUNTER — Other Ambulatory Visit: Payer: Self-pay | Admitting: *Deleted

## 2018-11-05 MED ORDER — ATORVASTATIN CALCIUM 40 MG PO TABS: 40.0000 mg | ORAL_TABLET | Freq: Every day | ORAL | 1 refills | Status: DC

## 2018-11-05 NOTE — Telephone Encounter (Signed)
[place]

## 2018-11-06 ENCOUNTER — Other Ambulatory Visit: Payer: Self-pay | Admitting: *Deleted

## 2018-11-06 DIAGNOSIS — E1143 Type 2 diabetes mellitus with diabetic autonomic (poly)neuropathy: Secondary | ICD-10-CM

## 2018-11-06 MED ORDER — GLIPIZIDE 5 MG PO TABS: ORAL_TABLET | ORAL | 0 refills | Status: DC

## 2018-11-06 NOTE — Telephone Encounter (Signed)
[place]

## 2018-11-08 ENCOUNTER — Ambulatory Visit (INDEPENDENT_AMBULATORY_CARE_PROVIDER_SITE_OTHER): Payer: Medicare Other | Admitting: Nurse Practitioner

## 2018-11-08 ENCOUNTER — Encounter: Payer: Self-pay | Admitting: Nurse Practitioner

## 2018-11-08 VITALS — BP 134/82 | HR 72 | Temp 97.8°F | Ht 70.0 in | Wt 256.6 lb

## 2018-11-08 DIAGNOSIS — Z794 Long term (current) use of insulin: Secondary | ICD-10-CM | POA: Diagnosis not present

## 2018-11-08 DIAGNOSIS — R809 Proteinuria, unspecified: Secondary | ICD-10-CM

## 2018-11-08 DIAGNOSIS — Z6836 Body mass index (BMI) 36.0-36.9, adult: Secondary | ICD-10-CM | POA: Diagnosis not present

## 2018-11-08 DIAGNOSIS — I10 Essential (primary) hypertension: Secondary | ICD-10-CM | POA: Diagnosis not present

## 2018-11-08 DIAGNOSIS — I5022 Chronic systolic (congestive) heart failure: Secondary | ICD-10-CM

## 2018-11-08 DIAGNOSIS — I255 Ischemic cardiomyopathy: Secondary | ICD-10-CM | POA: Diagnosis not present

## 2018-11-08 DIAGNOSIS — E1129 Type 2 diabetes mellitus with other diabetic kidney complication: Secondary | ICD-10-CM | POA: Diagnosis not present

## 2018-11-08 DIAGNOSIS — E782 Mixed hyperlipidemia: Secondary | ICD-10-CM

## 2018-11-08 DIAGNOSIS — I480 Paroxysmal atrial fibrillation: Secondary | ICD-10-CM | POA: Diagnosis not present

## 2018-11-08 DIAGNOSIS — Z23 Encounter for immunization: Secondary | ICD-10-CM | POA: Diagnosis not present

## 2018-11-08 MED ORDER — EMPAGLIFLOZIN 10 MG PO TABS: 10.0000 mg | ORAL_TABLET | Freq: Every day | ORAL | 3 refills | Status: DC

## 2018-11-08 NOTE — Progress Notes (Signed)
Careteam: Patient Care Team: Lauree Chandler, NP as PCP - General (Geriatric Medicine) [name], MD as PCP - Cardiology (Cardiology) [name], MD as Attending Physician (Urology) [name], MD as Consulting Physician (Nephrology)  Advanced Directive information Does Patient Have a Medical Advance Directive?: No  No Known Allergies  Chief Complaint  Patient presents with  . Medical Management of Chronic Issues    Pt is being seen for a 3 month routine visit. Pt brought blood sugar log.      HPI: Patient is a 77 y.o. male seen in the office today for routine follow up. Former pt of Dr Eulas Post.  Pt with uncontrolled diabetes. He was seen last in August with A1c 11.5 currently 11.3. He takes toujeo max 50 units daily, glipizide 10 mg in the morning and 5 mg in the pm, metformin 1000 mg by mouth twice daily.  Reports he is a chocolate fan and eats 3 candy bars a week but went without the last 3 months. Fasting blood sugars ranging from 195-300 Eats out a lot, states he is aware of proper food choices. Has met with a dietitian but did not learn much that he did not already know.   CAD/ICM/HTN/USA/HF - s/p 3V CABG. stable on ASA, coumadin, lipitor, coreg, lasix with KCL, lisinopril. No CP or SOB. Followed by cardio Dr Angelena Form. LDL at goal at 39.  Nephro [facility] added amlodipine 69m daily. No swelling in legs; no dizziness.  PAD - stable on ASA and coumadin. INR 3.5 (GOAL INR 2-3). Followed by cardio coumadin clinic  PAF - rate controlled on coreg. On jantoven and following with coumadin clinic.  (GOAL INR 2-3). Followed by cardio  Hx CKD- [facility]   Hx prostate CA - stable. Followed by urology Dr WJeffie Pollockbut has not followed up in over a year. Still needs to make an appt.   Hyperlipidemia - stable on lipitor.   Review of Systems:  Review of Systems  Constitutional: Negative for chills,  fever and weight loss.  HENT: Negative for tinnitus.   Respiratory: Negative for cough, sputum production and shortness of breath.   Cardiovascular: Negative for chest pain, palpitations and leg swelling.  Gastrointestinal: Negative for abdominal pain, constipation, diarrhea and heartburn.  Genitourinary: Positive for frequency. Negative for dysuria and urgency.  Musculoskeletal: Negative for back pain, falls, joint pain and myalgias.  Skin: Negative.   Neurological: Positive for tingling. Negative for dizziness and headaches.  Psychiatric/Behavioral: Negative for depression and memory loss. The patient does not have insomnia.     Past Medical History:  Diagnosis Date  . Atrial fibrillation (HCC)    a. coumadin;  b. Amiodarone  . CAD (coronary artery disease)    a.  Lexiscan Myoview (02/14/14):  Apical, inferolateral scar; ? Mild ischemia; EF 32%.;  b.  LHC (02/21/14):  dLM 100%, LAD 100%, CFX 100%, dRCA 50%. => CABG (L-LAD, S-OM, S-PDA)  . Chronic systolic heart failure ([place]   . CKD (chronic kidney disease)    proteinuria  . Hemorrhage of gastrointestinal tract, unspecified   . Hemorrhage of rectum and anus   . HLD (hyperlipidemia)   . HTN (hypertension)   . Impotence of organic origin   . Ischemic cardiomyopathy    a. Echo (02/17/14):  EF 35-40%, anteroseptal and apical AK, Gr 2 DD, MAC, mod LAE.  .Marland KitchenNephrolithiasis   . Peripheral vascular disease, unspecified ([place]   . Prostate cancer ([place]   .  Thrombocytopenia, unspecified ([place])   . Tobacco use disorder   . Type II or unspecified type diabetes mellitus with renal manifestations, uncontrolled(250.42)    Past Surgical History:  Procedure Laterality Date  . BACK SURGERY    . CATARACT EXTRACTION  2010  . CLIPPING OF ATRIAL APPENDAGE Left 02/24/2014   Procedure: CLIPPING OF ATRIAL APPENDAGE;  Surgeon: Gaye Pollack, MD;  Location: [place];  Service: Open Heart Surgery;  Laterality: Left;  . COLONOSCOPY  2005   Normal  . CORONARY  ARTERY BYPASS GRAFT N/A 02/24/2014   Procedure: Coronary artery bypass graft times three using left internal mammary artery and left leg greater saphenous vein harvested endoscopically.;  Surgeon: Gaye Pollack, MD;  Location[facility];  Service: Open Heart Surgery;  Laterality: N/A;  . INTRAOPERATIVE TRANSESOPHAGEAL ECHOCARDIOGRAM N/A 02/24/2014   Procedure: INTRAOPERATIVE TRANSESOPHAGEAL ECHOCARDIOGRAM;  Surgeon: Gaye Pollack, MD;  Location: [place] OR;  Service: Open Heart Surgery;  Laterality: N/A;  . LEFT HEART CATHETERIZATION WITH CORONARY ANGIOGRAM N/A 02/21/2014   Procedure: LEFT HEART CATHETERIZATION WITH CORONARY ANGIOGRAM;  Surgeon: Peter M Martinique, MD;  Location: [facility] CATH LAB;  Service: Cardiovascular;  Laterality: N/A;   Social History:   reports that he has never smoked. He quit smokeless tobacco use about 4 years ago.  His smokeless tobacco use included chew. He reports that he does not drink alcohol or use drugs.  Family History  Problem Relation Age of Onset  . Cancer Mother 45       leukemia  . Heart disease Father   . Heart attack Father 55  . Lung cancer Brother     Medications: Patient's Medications  New Prescriptions   No medications on file  Previous Medications   AMLODIPINE (NORVASC) 5 MG TABLET    Take 5 mg by mouth daily.    ASPIRIN EC 81 MG TABLET    Take 1 tablet (81 mg total) by mouth daily.   ATORVASTATIN (LIPITOR) 40 MG TABLET    Take 1 tablet (40 mg total) by mouth daily.   CALCIUM CARBONATE-VITAMIN D 600-200 MG-UNIT TABS    Take 1 tablet by mouth daily. For bones    CARVEDILOL PO    Take 25 mg by mouth 2 (two) times daily with a meal.    FLAXSEED, LINSEED, (FLAXSEED OIL PO)    Take 1 tablet by mouth daily.    FUROSEMIDE (LASIX) 40 MG TABLET    TAKE TWO TABLETS BY MOUTH IN THE MORNING AND ONE EVERY EVENING   GLIPIZIDE (GLUCOTROL) 5 MG TABLET    Take two tablets by mouth in the morning and take one tablet in the evening to control blood sugar   JANTOVEN 5 MG TABLET     take 1 tablet by mouth once daily or as directed by  coumadin clinic   LISINOPRIL (PRINIVIL,ZESTRIL) 40 MG TABLET    Take 40 mg by mouth 2 (two) times daily. Per nephrology   METFORMIN (GLUCOPHAGE) 1000 MG TABLET    take 1 tablet by mouth twice daily with meals   MULTIPLE VITAMIN (MULTIVITAMINS PO)    Take 1 tablet by mouth daily.     POTASSIUM CHLORIDE SA (K-DUR,KLOR-CON) 20 MEQ TABLET    TAKE ONE TABLET BY MOUTH ONE TIME DAILY    TOUJEO SOLOSTAR 300 UNIT/ML SOPN    INJECT 50 UNITS INTO THE SKIN ONCE DAILY  Modified Medications   No medications on file  Discontinued Medications   No medications on file  Physical Exam:  Vitals:   11/08/18 0816  BP: 134/82  Pulse: 72  Temp: 97.8 F (36.6 C)  TempSrc: Oral  SpO2: 95%  Weight: 256 lb 9.6 oz (116.4 kg)  Height: _0  (1.778 m)   Body mass index is 36.82 kg/m.  Physical Exam  Constitutional: He is oriented to person, place, and time. He appears well-developed and well-nourished. No distress.  HENT:  Head: Normocephalic and atraumatic.  Mouth/Throat: Oropharynx is clear and moist. No oropharyngeal exudate.  Eyes: Pupils are equal, round, and reactive to light. Conjunctivae and EOM are normal.  Neck: Normal range of motion. Neck supple.  Cardiovascular: Normal rate and normal heart sounds. An irregular rhythm present.  Pulmonary/Chest: Effort normal and breath sounds normal.  Abdominal: Soft. Bowel sounds are normal.  Musculoskeletal: He exhibits edema. He exhibits no tenderness.  Neurological: He is alert and oriented to person, place, and time.  Skin: Skin is warm and dry. He is not diaphoretic.  Psychiatric: He has a normal mood and affect.    Labs reviewed: Basic Metabolic Panel: Recent Labs    05/04/18 0842 08/03/18 0838 11/02/18 0902  NA 140 140 140  K 4.3 4.2 3.9  CL 103 104 102  CO2 _1 GLUCOSE 309* 293* 276*  BUN _2 CREATININE 1.01 0.96 1.00  CALCIUM 9.4 9.0 9.0   Liver Function  Tests: Recent Labs    02/02/18 0828 05/04/18 0842 08/03/18 0838 11/02/18 0902  AST 13 13  --  14  ALT _3 BILITOT 0.6 0.6  --  0.7  PROT 6.2 6.2  --  6.3   No results for input(s): LIPASE, AMYLASE in the last 8760 hours. No results for input(s): AMMONIA in the last 8760 hours. CBC: No results for input(s): WBC, NEUTROABS, HGB, HCT, MCV, PLT in the last 8760 hours. Lipid Panel: Recent Labs    05/04/18 0842 08/03/18 0838 11/02/18 0902  CHOL 106 103 98  HDL 39* 36* 34*  LDLCALC 43 41 39  TRIG 153* 187* 167*  CHOLHDL 2.7 2.9 2.9   TSH: No results for input(s): TSH in the last 8760 hours. A1C: Lab Results  Component Value Date   HGBA1C 11.3 (H) 11/02/2018     Assessment/Plan 1. Type 2 diabetes mellitus with microalbuminuria, with long-term current use of insulin (HCC) Not at goal. No hypoglycemia noted continues on metformin 1000 mg BID and glipizide 5 mg BID, will add jardiance 10 mg daily at this time. Encouraged dietary compliance, routine foot care and to keep up with diabetic eye exams through ophthalmology - empagliflozin (JARDIANCE) 10 MG TABS tablet; Take 10 mg by mouth daily.  Dispense: 30 tablet; Refill: 3  2. Mixed hyperlipidemia Elevated triglycerides due to uncontrolled diabetes. Encouraged diet modifications. To continue lipitor 40 mg daily  3. Class 2 severe obesity due to excess calories with serious comorbidity and body mass index (BMI) of 36.0 to 36.9 in adult [facility]) Noted today, encouraged weight loss with diet and exercise.   4. Chronic systolic congestive heart failure (HCC) Stable, euvolemic, to continue current medications as prescribed  5. Essential hypertension Stable, cont current regimen with dietary modifications.   6. Paroxysmal atrial fibrillation (HCC) Rate controlled, continues on jantoven throught coumadin clinic for anticoagulation  Next appt: 6 weeks for diabetes  Adelee Hannula K. [place], [place] [facility] [phone]

## 2018-11-08 NOTE — Patient Instructions (Signed)
6 weeks for diabetes -- bring blood sugar log    DASH Eating Plan DASH stands for "Dietary Approaches to Stop Hypertension." The DASH eating plan is a healthy eating plan that has been shown to reduce high blood pressure (hypertension). It may also reduce your risk for type 2 diabetes, heart disease, and stroke. The DASH eating plan may also help with weight loss. What are tips for following this plan? General guidelines  Avoid eating more than 2,300 mg (milligrams) of salt (sodium) a day. If you have hypertension, you may need to reduce your sodium intake to 1,500 mg a day.  Limit alcohol intake to no more than 1 drink a day for nonpregnant women and 2 drinks a day for men. One drink equals 12 oz of beer, 5 oz of wine, or 1 oz of hard liquor.  Work with your health care provider to maintain a healthy body weight or to lose weight. Ask what an ideal weight is for you.  Get at least 30 minutes of exercise that causes your heart to beat faster (aerobic exercise) most days of the week. Activities may include walking, swimming, or biking.  Work with your health care provider or diet and nutrition specialist (dietitian) to adjust your eating plan to your individual calorie needs. Reading food labels  Check food labels for the amount of sodium per serving. Choose foods with less than 5 percent of the Daily Value of sodium. Generally, foods with less than 300 mg of sodium per serving fit into this eating plan.  To find whole grains, look for the word "whole" as the first word in the ingredient list. Shopping  Buy products labeled as "low-sodium" or "no salt added."  Buy fresh foods. Avoid canned foods and premade or frozen meals. Cooking  Avoid adding salt when cooking. Use salt-free seasonings or herbs instead of table salt or sea salt. Check with your health care provider or pharmacist before using salt substitutes.  Do not fry foods. Cook foods using healthy methods such as baking,  boiling, grilling, and broiling instead.  Cook with heart-healthy oils, such as olive, canola, soybean, or sunflower oil. Meal planning   Eat a balanced diet that includes: ? 5 or more servings of fruits and vegetables each day. At each meal, try to fill half of your plate with fruits and vegetables. ? Up to 6-8 servings of whole grains each day. ? Less than 6 oz of lean meat, poultry, or fish each day. A 3-oz serving of meat is about the same size as a deck of cards. One egg equals 1 oz. ? 2 servings of low-fat dairy each day. ? A serving of nuts, seeds, or beans 5 times each week. ? Heart-healthy fats. Healthy fats called Omega-3 fatty acids are found in foods such as flaxseeds and coldwater fish, like sardines, salmon, and mackerel.  Limit how much you eat of the following: ? Canned or prepackaged foods. ? Food that is high in trans fat, such as fried foods. ? Food that is high in saturated fat, such as fatty meat. ? Sweets, desserts, sugary drinks, and other foods with added sugar. ? Full-fat dairy products.  Do not salt foods before eating.  Try to eat at least 2 vegetarian meals each week.  Eat more home-cooked food and less restaurant, buffet, and fast food.  When eating at a restaurant, ask that your food be prepared with less salt or no salt, if possible. What foods are recommended? The items listed  may not be a complete list. Talk with your dietitian about what dietary choices are best for you. Grains Whole-grain or whole-wheat bread. Whole-grain or whole-wheat pasta. Brown rice. Modena Morrow. Bulgur. Whole-grain and low-sodium cereals. Pita bread. Low-fat, low-sodium crackers. Whole-wheat flour tortillas. Vegetables Fresh or frozen vegetables (raw, steamed, roasted, or grilled). Low-sodium or reduced-sodium tomato and vegetable juice. Low-sodium or reduced-sodium tomato sauce and tomato paste. Low-sodium or reduced-sodium canned vegetables. Fruits All fresh, dried, or  frozen fruit. Canned fruit in natural juice (without added sugar). Meat and other protein foods Skinless chicken or [place]. Ground chicken or [place]. Pork with fat trimmed off. Fish and seafood. Egg whites. Dried beans, peas, or lentils. Unsalted nuts, nut butters, and seeds. Unsalted canned beans. Lean cuts of beef with fat trimmed off. Low-sodium, lean deli meat. Dairy Low-fat (1%) or fat-free (skim) milk. Fat-free, low-fat, or reduced-fat cheeses. Nonfat, low-sodium ricotta or cottage cheese. Low-fat or nonfat yogurt. Low-fat, low-sodium cheese. Fats and oils Soft margarine without trans fats. Vegetable oil. Low-fat, reduced-fat, or light mayonnaise and salad dressings (reduced-sodium). Canola, safflower, olive, soybean, and sunflower oils. Avocado. Seasoning and other foods Herbs. Spices. Seasoning mixes without salt. Unsalted popcorn and pretzels. Fat-free sweets. What foods are not recommended? The items listed may not be a complete list. Talk with your dietitian about what dietary choices are best for you. Grains Baked goods made with fat, such as croissants, muffins, or some breads. Dry pasta or rice meal packs. Vegetables Creamed or fried vegetables. Vegetables in a cheese sauce. Regular canned vegetables (not low-sodium or reduced-sodium). Regular canned tomato sauce and paste (not low-sodium or reduced-sodium). Regular tomato and vegetable juice (not low-sodium or reduced-sodium). Angie Fava. Olives. Fruits Canned fruit in a light or heavy syrup. Fried fruit. Fruit in cream or butter sauce. Meat and other protein foods Fatty cuts of meat. Ribs. Fried meat. Berniece Salines. Sausage. Bologna and other processed lunch meats. Salami. Fatback. Hotdogs. Bratwurst. Salted nuts and seeds. Canned beans with added salt. Canned or smoked fish. Whole eggs or egg yolks. Chicken or [place] with skin. Dairy Whole or 2% milk, cream, and half-and-half. Whole or full-fat cream cheese. Whole-fat or sweetened yogurt.  Full-fat cheese. Nondairy creamers. Whipped toppings. Processed cheese and cheese spreads. Fats and oils Butter. Stick margarine. Lard. Shortening. Ghee. Bacon fat. Tropical oils, such as coconut, palm kernel, or palm oil. Seasoning and other foods Salted popcorn and pretzels. Onion salt, garlic salt, seasoned salt, table salt, and sea salt. Worcestershire sauce. Tartar sauce. Barbecue sauce. Teriyaki sauce. Soy sauce, including reduced-sodium. Steak sauce. Canned and packaged gravies. Fish sauce. Oyster sauce. Cocktail sauce. Horseradish that you find on the shelf. Ketchup. Mustard. Meat flavorings and tenderizers. Bouillon cubes. Hot sauce and Tabasco sauce. Premade or packaged marinades. Premade or packaged taco seasonings. Relishes. Regular salad dressings. Where to find more information:  National Heart, Lung, and [place]: https://wilson-eaton.com/  American Heart Association: www.heart.org Summary  The DASH eating plan is a healthy eating plan that has been shown to reduce high blood pressure (hypertension). It may also reduce your risk for type 2 diabetes, heart disease, and stroke.  With the DASH eating plan, you should limit salt (sodium) intake to 2,300 mg a day. If you have hypertension, you may need to reduce your sodium intake to 1,500 mg a day.  When on the DASH eating plan, aim to eat more fresh fruits and vegetables, whole grains, lean proteins, low-fat dairy, and heart-healthy fats.  Work with your health care provider or diet  and nutrition specialist (dietitian) to adjust your eating plan to your individual calorie needs. This information is not intended to replace advice given to you by your health care provider. Make sure you discuss any questions you have with your health care provider. Document Released: 12/01/2011 Document Revised: 12/05/2016 Document Reviewed: 12/05/2016 Elsevier Interactive Patient Education  [employer].

## 2018-11-15 ENCOUNTER — Other Ambulatory Visit: Payer: Self-pay | Admitting: *Deleted

## 2018-11-15 DIAGNOSIS — Z794 Long term (current) use of insulin: Secondary | ICD-10-CM

## 2018-11-15 DIAGNOSIS — E1143 Type 2 diabetes mellitus with diabetic autonomic (poly)neuropathy: Secondary | ICD-10-CM

## 2018-11-15 MED ORDER — INSULIN GLARGINE (1 UNIT DIAL) 300 UNIT/ML ~~LOC~~ SOPN: 50.0000 [IU] | PEN_INJECTOR | Freq: Every day | SUBCUTANEOUS | 2 refills | Status: DC

## 2018-11-15 NOTE — Telephone Encounter (Signed)
[facility]

## 2018-11-30 ENCOUNTER — Ambulatory Visit (INDEPENDENT_AMBULATORY_CARE_PROVIDER_SITE_OTHER): Payer: Medicare Other | Admitting: Pharmacist

## 2018-11-30 DIAGNOSIS — Z5181 Encounter for therapeutic drug level monitoring: Secondary | ICD-10-CM | POA: Diagnosis not present

## 2018-11-30 DIAGNOSIS — I4891 Unspecified atrial fibrillation: Secondary | ICD-10-CM | POA: Diagnosis not present

## 2018-11-30 LAB — POCT INR: INR: 1.9 — AB (ref 2.0–3.0)

## 2018-11-30 NOTE — Patient Instructions (Signed)
Description   Take 2 tablets today then continue taking 1.5 tablets daily. Recheck in 4 weeks. Call with any new medications [phone]

## 2018-12-07 ENCOUNTER — Other Ambulatory Visit: Payer: Self-pay | Admitting: *Deleted

## 2018-12-07 MED ORDER — POTASSIUM CHLORIDE CRYS ER 20 MEQ PO TBCR: 20.0000 meq | EXTENDED_RELEASE_TABLET | Freq: Every day | ORAL | 2 refills | Status: DC

## 2018-12-07 MED ORDER — METFORMIN HCL 1000 MG PO TABS: 1000.0000 mg | ORAL_TABLET | Freq: Two times a day (BID) | ORAL | 2 refills | Status: DC

## 2018-12-07 NOTE — Telephone Encounter (Signed)
Patient requested refill. Faxed.  

## 2018-12-17 ENCOUNTER — Ambulatory Visit: Payer: Medicare Other | Admitting: Nurse Practitioner

## 2018-12-27 ENCOUNTER — Telehealth: Payer: Self-pay | Admitting: *Deleted

## 2018-12-27 NOTE — Telephone Encounter (Signed)
He is not due for his A1c yet (insurance will only pay every 3 months , I was wanting to evaluate how his blood sugars were doing with jardiance since we added this at his last OV.

## 2018-12-27 NOTE — Telephone Encounter (Signed)
Patient called and stated that he wanted his blood sugar checked before he came back for his appointment.   I reviewed last OV note and Janett Billow did not order any bloodwork to be done.   I reviewed last lab and Janett Billow only stated for patient to bring in his blood sugar log and all Medications. Informed patient of this and he got upset and stated that he wasn't coming here for an appointment if his bloodwork was not checked and he would just cancel his appointment and schedule it for 6 months out. Please Advise.

## 2018-12-28 NOTE — Telephone Encounter (Signed)
Patient stated that he is not taking the Jardiance. Stated that the medication is too expensive and he read all the side effects and didn't like them. He took for a week and Discontinued the medication. Patient stated he would keep his appointment.   Medication list updated.

## 2018-12-28 NOTE — Telephone Encounter (Signed)
Okay thank you

## 2019-01-04 ENCOUNTER — Ambulatory Visit (INDEPENDENT_AMBULATORY_CARE_PROVIDER_SITE_OTHER): Payer: Medicare Other | Admitting: *Deleted

## 2019-01-04 DIAGNOSIS — I4891 Unspecified atrial fibrillation: Secondary | ICD-10-CM | POA: Diagnosis not present

## 2019-01-04 DIAGNOSIS — Z5181 Encounter for therapeutic drug level monitoring: Secondary | ICD-10-CM

## 2019-01-04 LAB — POCT INR: INR: 2.7 (ref 2.0–3.0)

## 2019-01-04 NOTE — Patient Instructions (Addendum)
Description   Continue taking 1.5 tablets daily. Recheck in 5 weeks. Call with any new medications [phone]

## 2019-01-09 ENCOUNTER — Other Ambulatory Visit: Payer: Self-pay | Admitting: Cardiovascular Disease

## 2019-01-28 ENCOUNTER — Other Ambulatory Visit: Payer: Self-pay | Admitting: Nurse Practitioner

## 2019-01-28 DIAGNOSIS — E1143 Type 2 diabetes mellitus with diabetic autonomic (poly)neuropathy: Secondary | ICD-10-CM

## 2019-01-28 DIAGNOSIS — Z794 Long term (current) use of insulin: Secondary | ICD-10-CM

## 2019-02-08 ENCOUNTER — Encounter: Payer: Self-pay | Admitting: Nurse Practitioner

## 2019-02-08 ENCOUNTER — Ambulatory Visit: Payer: Medicare Other | Admitting: Nurse Practitioner

## 2019-02-08 ENCOUNTER — Ambulatory Visit (INDEPENDENT_AMBULATORY_CARE_PROVIDER_SITE_OTHER): Payer: Medicare Other | Admitting: Nurse Practitioner

## 2019-02-08 ENCOUNTER — Ambulatory Visit (INDEPENDENT_AMBULATORY_CARE_PROVIDER_SITE_OTHER): Payer: Medicare Other | Admitting: Pharmacist

## 2019-02-08 VITALS — BP 138/80 | HR 61 | Temp 98.3°F | Ht 70.0 in | Wt 255.0 lb

## 2019-02-08 DIAGNOSIS — R809 Proteinuria, unspecified: Secondary | ICD-10-CM

## 2019-02-08 DIAGNOSIS — Z5181 Encounter for therapeutic drug level monitoring: Secondary | ICD-10-CM

## 2019-02-08 DIAGNOSIS — I480 Paroxysmal atrial fibrillation: Secondary | ICD-10-CM | POA: Diagnosis not present

## 2019-02-08 DIAGNOSIS — E782 Mixed hyperlipidemia: Secondary | ICD-10-CM

## 2019-02-08 DIAGNOSIS — I5022 Chronic systolic (congestive) heart failure: Secondary | ICD-10-CM | POA: Diagnosis not present

## 2019-02-08 DIAGNOSIS — Z6836 Body mass index (BMI) 36.0-36.9, adult: Secondary | ICD-10-CM | POA: Diagnosis not present

## 2019-02-08 DIAGNOSIS — I739 Peripheral vascular disease, unspecified: Secondary | ICD-10-CM | POA: Diagnosis not present

## 2019-02-08 DIAGNOSIS — I10 Essential (primary) hypertension: Secondary | ICD-10-CM | POA: Diagnosis not present

## 2019-02-08 DIAGNOSIS — C61 Malignant neoplasm of prostate: Secondary | ICD-10-CM | POA: Diagnosis not present

## 2019-02-08 DIAGNOSIS — Z794 Long term (current) use of insulin: Secondary | ICD-10-CM

## 2019-02-08 DIAGNOSIS — I4891 Unspecified atrial fibrillation: Secondary | ICD-10-CM

## 2019-02-08 DIAGNOSIS — E1129 Type 2 diabetes mellitus with other diabetic kidney complication: Secondary | ICD-10-CM

## 2019-02-08 DIAGNOSIS — D696 Thrombocytopenia, unspecified: Secondary | ICD-10-CM

## 2019-02-08 DIAGNOSIS — E66812 Obesity, class 2: Secondary | ICD-10-CM

## 2019-02-08 LAB — POCT INR: INR: 2.6 (ref 2.0–3.0)

## 2019-02-08 NOTE — Patient Instructions (Signed)
Description   Continue taking 1.5 tablets daily. Recheck in 6 weeks. Call with any new medications [phone]

## 2019-02-08 NOTE — Patient Instructions (Addendum)
To increase toujeo to 55 units x 1 week if fasting blood sugars continue over 150 to increase to 60 units  Endocrine referral has been placed.   To continue to work on diet and increase activity    Follow up in 3 months, will get fasting lipids at this appt.  To sign record release for eye doctor  To follow up with urologist.

## 2019-02-08 NOTE — Progress Notes (Signed)
Careteam: Patient Care Team: Lauree Chandler, NP as PCP - General (Geriatric Medicine) [name], MD as PCP - Cardiology (Cardiology) [name], MD as Attending Physician (Urology) [name], MD as Consulting Physician (Nephrology)  Advanced Directive information    No Known Allergies  Chief Complaint  Patient presents with  . Medical Management of Chronic Issues    3MTH FOLLOW-UP     HPI: Patient is a 78 y.o. male seen in the office today for routine 3 months  follow up. Pt with uncontrolled diabetes. He was seen last in November A1c 11.3.  He takes toujeo max 50 units daily, glipizide 10 mg in the morning and 5 mg in the pm, metformin 1000 mg by mouth twice daily. Patient was started on Jardiance 10 mg daily last visit, but pt reported that he did not take it due to high co-pay and side effects. Pt did not call back to the office to notify the provider about it. Pt checks his blood sugar daily. He brought his blood sugar log for the past couple months. Fasting blood sugars ranging from 200-300. Pt continues to eats out a lot, states he is aware of proper food choices. Pt reports that he tries to stay away from salty foods, tries to reduce his carbohydrates intake and started on intermittent fasting diet together with his family. No hypoglycemia   CAD/ICM/HTN/HF - s/p 3V CABG. stable on ASA, coumadin, lipitor, coreg, lasix with KCL, lisinopril. No CP or SOB. Followed by cardio Dr Angelena Form. LDL at goal at 39. Nephro [facility] added amlodipine 82m daily. No swelling in legs; no dizziness. He has not followed with nephrology recently. They continue to fill medication.   PAD - stable on ASA and coumadin. . Followed by cardio coumadin clinic  PAF - rate controlled on coreg. On jantoven and following with coumadin clinic. (GOAL INR 2-3). Followed by cardio  Hx CKD- [facility].   Hx prostate CA -  stable. Followed by urology Dr WJeffie Pollockbut has not followed up in over a year. Still needs to make an appt.   Hyperlipidemia - stable on lipitor.    Review of Systems:  Review of Systems  Constitutional: Negative for chills, diaphoresis and fever.  HENT: Negative for congestion, ear pain, hearing loss, sinus pain and sore throat.   Eyes: Negative for blurred vision, photophobia and pain.  Respiratory: Negative for cough, sputum production, shortness of breath and wheezing.   Cardiovascular: Negative for chest pain, palpitations, claudication and leg swelling.  Gastrointestinal: Negative for abdominal pain, blood in stool, constipation, diarrhea, heartburn, nausea and vomiting.  Genitourinary: Negative for frequency, hematuria and urgency.  Musculoskeletal: Negative for back pain and myalgias.  Skin: Negative for rash.  Neurological: Negative for dizziness, tingling, speech change, focal weakness, loss of consciousness, weakness and headaches.  Endo/Heme/Allergies: Bruises/bleeds easily.    Past Medical History:  Diagnosis Date  . Atrial fibrillation (HCC)    a. coumadin;  b. Amiodarone  . CAD (coronary artery disease)    a.  Lexiscan Myoview (02/14/14):  Apical, inferolateral scar; ? Mild ischemia; EF 32%.;  b.  LHC (02/21/14):  dLM 100%, LAD 100%, CFX 100%, dRCA 50%. => CABG (L-LAD, S-OM, S-PDA)  . [place] systolic heart failure ([place]   . CKD (chronic kidney disease)    proteinuria  . Hemorrhage of gastrointestinal tract, unspecified   . Hemorrhage of rectum and anus   . HLD (hyperlipidemia)   .  HTN (hypertension)   . Impotence of organic origin   . Ischemic cardiomyopathy    a. Echo (02/17/14):  EF 35-40%, anteroseptal and apical AK, Gr 2 DD, MAC, mod LAE.  [name] Nephrolithiasis   . Peripheral vascular disease, unspecified ([place])   . Prostate cancer ([place])   . Thrombocytopenia, unspecified ([place])   . Tobacco use disorder   . Type II or unspecified type diabetes mellitus with renal  manifestations, uncontrolled(250.42)    Past Surgical History:  Procedure Laterality Date  . BACK SURGERY    . CATARACT EXTRACTION  2010  . CLIPPING OF ATRIAL APPENDAGE Left 02/24/2014   Procedure: CLIPPING OF ATRIAL APPENDAGE;  Surgeon: Gaye Pollack, MD;  Location: [place];  Service: Open Heart Surgery;  Laterality: Left;  . COLONOSCOPY  2005   Normal  . CORONARY ARTERY BYPASS GRAFT N/A 02/24/2014   Procedure: Coronary artery bypass graft times three using left internal mammary artery and left leg greater saphenous vein harvested endoscopically.;  Surgeon: Gaye Pollack, MD;  Location: [facility] OR;  Service: Open Heart Surgery;  Laterality: N/A;  . INTRAOPERATIVE TRANSESOPHAGEAL ECHOCARDIOGRAM N/A 02/24/2014   Procedure: INTRAOPERATIVE TRANSESOPHAGEAL ECHOCARDIOGRAM;  Surgeon: Gaye Pollack, MD;  Location: [place] OR;  Service: Open Heart Surgery;  Laterality: N/A;  . LEFT HEART CATHETERIZATION WITH CORONARY ANGIOGRAM N/A 02/21/2014   Procedure: LEFT HEART CATHETERIZATION WITH CORONARY ANGIOGRAM;  Surgeon: Peter M Martinique, MD;  Location: [facility] CATH LAB;  Service: Cardiovascular;  Laterality: N/A;   Social History:   reports that he has never smoked. He quit smokeless tobacco use about 4 years ago.  His smokeless tobacco use included chew. He reports that he does not drink alcohol or use drugs.  Family History  Problem Relation Age of Onset  . Cancer Mother 43       leukemia  . Heart disease Father   . Heart attack Father 62  . Lung cancer Brother     Medications: Patient's Medications  New Prescriptions   No medications on file  Previous Medications   AMLODIPINE (NORVASC) 5 MG TABLET    Take 5 mg by mouth daily.   ASPIRIN EC 81 MG TABLET    Take 1 tablet (81 mg total) by mouth daily.   ATORVASTATIN (LIPITOR) 40 MG TABLET    Take 1 tablet (40 mg total) by mouth daily.   CALCIUM CARBONATE-VITAMIN D 600-200 MG-UNIT TABS    Take 1 tablet by mouth daily. For bones    CARVEDILOL (COREG) 25 MG TABLET     Take 25 mg by mouth 2 (two) times daily with a meal.   FLAXSEED, LINSEED, (FLAXSEED OIL PO)    Take 1 tablet by mouth daily.    FUROSEMIDE (LASIX) 40 MG TABLET    TAKE TWO TABLETS BY MOUTH IN THE MORNING AND ONE EVERY EVENING   GLIPIZIDE (GLUCOTROL) 5 MG TABLET    TAKE TWO TABLETS BY MOUTH IN THE MORNING AND ONE IN THE EVENING TO CONTROL BLOOD SUGAR   JANTOVEN 5 MG TABLET    TAKE 1 TABLET BY MOUTH ONCE DAILY OR AS DIRECTED BY COUMADIN CLINIC   LISINOPRIL (PRINIVIL,ZESTRIL) 40 MG TABLET    Take 40 mg by mouth 2 (two) times daily.   METFORMIN (GLUCOPHAGE) 1000 MG TABLET    Take 1 tablet (1,000 mg total) by mouth 2 (two) times daily with a meal.   MULTIPLE VITAMIN (MULTIVITAMINS PO)    Take 1 tablet by mouth daily.     POTASSIUM  CHLORIDE SA (K-DUR,KLOR-CON) 20 MEQ TABLET    Take 1 tablet (20 mEq total) by mouth daily.   TOUJEO SOLOSTAR 300 UNIT/ML SOPN    INJECT 50 UNITS INTO THE SKIN DAILY   Modified Medications   No medications on file  Discontinued Medications   AMLODIPINE (NORVASC) 5 MG TABLET    Take 5 mg by mouth daily.    CARVEDILOL PO    Take 25 mg by mouth 2 (two) times daily with a meal.    LISINOPRIL (PRINIVIL,ZESTRIL) 40 MG TABLET    Take 40 mg by mouth 2 (two) times daily. Per nephrology     Physical Exam:  Vitals:   02/08/19 0941  BP: 138/80  Pulse: 61  Temp: 98.3 F (36.8 C)  TempSrc: Oral  SpO2: 96%  Weight: 255 lb (115.7 kg)  Height: _0  (1.778 m)   Body mass index is 36.59 kg/m.  Physical Exam Constitutional:      Appearance: Normal appearance. He is obese.  HENT:     Head: Normocephalic and atraumatic.     Nose: Nose normal.  Eyes:     Extraocular Movements: Extraocular movements intact.     Conjunctiva/sclera: Conjunctivae normal.     Pupils: Pupils are equal, round, and reactive to light.  Neck:     Musculoskeletal: Normal range of motion.  Cardiovascular:     Rate and Rhythm: Rhythm irregular.     Pulses: Normal pulses.  Pulmonary:     Effort:  Pulmonary effort is normal.     Breath sounds: Normal breath sounds.  Abdominal:     General: Abdomen is flat. Bowel sounds are normal.     Palpations: Abdomen is soft.  Musculoskeletal:        General: No tenderness.     Right lower leg: No edema.     Left lower leg: No edema.  Skin:    General: Skin is warm and dry.     Capillary Refill: Capillary refill takes less than 2 seconds.     Findings: No rash.  Neurological:     General: No focal deficit present.     Mental Status: He is alert and oriented to person, place, and time. Mental status is at baseline.  Psychiatric:        Mood and Affect: Mood normal.        Behavior: Behavior normal.        Thought Content: Thought content normal.     Labs reviewed: Basic Metabolic Panel: Recent Labs    05/04/18 0842 08/03/18 0838 11/02/18 0902  NA 140 140 140  K 4.3 4.2 3.9  CL 103 104 102  CO2 _1 GLUCOSE 309* 293* 276*  BUN _2 CREATININE 1.01 0.96 1.00  CALCIUM 9.4 9.0 9.0   Liver Function Tests: Recent Labs    05/04/18 0842 08/03/18 0838 11/02/18 0902  AST 13  --  14  ALT _3 BILITOT 0.6  --  0.7  PROT 6.2  --  6.3   No results for input(s): LIPASE, AMYLASE in the last 8760 hours. No results for input(s): AMMONIA in the last 8760 hours. CBC: No results for input(s): WBC, NEUTROABS, HGB, HCT, MCV, PLT in the last 8760 hours. Lipid Panel: Recent Labs    05/04/18 0842 08/03/18 0838 11/02/18 0902  CHOL 106 103 98  HDL 39* 36* 34*  LDLCALC 43 41 39  TRIG 153* 187* 167*  CHOLHDL 2.7 2.9 2.9  TSH: No results for input(s): TSH in the last 8760 hours. A1C: Lab Results  Component Value Date   HGBA1C 11.3 (H) 11/02/2018     Assessment/Plan 1. Chronic systolic congestive heart failure (HCC) Stable condition. Continue the current treatment. Following with cardiology  2. Type 2 diabetes mellitus with microalbuminuria, with long-term current use of insulin ([place])   Pt has uncontrolled  diabetes with continues increased A1c (11.3 on 11/02/2018). Pt's Toujeo is increased up to 55 for one week and then up to 60 units a day if fasting blood sugars stay above 150s.  - Hemoglobin A1c - COMPLETE METABOLIC PANEL WITH GFR - Ambulatory referral to Endocrinology -discussed importance of diet modifications (he is noncompliant) and increase physical activity as tolerates.  3. Class 2 severe obesity due to excess calories with serious comorbidity and body mass index (BMI) of 36.0 to 36.9 in adult [facility]) Patient advised to decrease his calorie intake and increase his physical activity to promote healthy weight loss.   4. PVD (peripheral vascular disease) (HCC) Stable condition, continue with current treatment  5. Paroxysmal atrial fibrillation (HCC) Followed by coumadin clinic for anticoagulation. INR within recommended range from 01/04/2019 - CBC with Differential/Platelets  6. Thrombocytopenia (HCC) CBC checked today to assess thrombocytopenia  7. Prostate cancer [facility]) Followed by urologist, but needs to schedule appointment this year.  8. Essential hypertension Stable condition, continue the current medication regiment  9. Mixed hyperlipidemia Stable cholesterol level except slightly elevated triglycerides 167 from 11/09/2019  10. Proteinuria, unspecified type Followed by nephrologist, need to schedule appointment soon  Next appt: scheduled in 3 months Jossette Zirbel K. [place], [facility] [phone]

## 2019-02-09 LAB — CBC WITH DIFFERENTIAL/PLATELET
Absolute Monocytes: 675 cells/uL (ref 200–950)
Basophils Absolute: 28 cells/uL (ref 0–200)
Basophils Relative: 0.4 %
Eosinophils Absolute: 114 cells/uL (ref 15–500)
Eosinophils Relative: 1.6 %
HCT: 41.7 % (ref 38.5–50.0)
Hemoglobin: 14.2 g/dL (ref 13.2–17.1)
Lymphs Abs: 1924 cells/uL (ref 850–3900)
MCH: 28.3 pg (ref 27.0–33.0)
MCHC: 34.1 g/dL (ref 32.0–36.0)
MCV: 83.2 fL (ref 80.0–100.0)
MPV: 10.6 fL (ref 7.5–12.5)
Monocytes Relative: 9.5 %
Neutro Abs: 4359 cells/uL (ref 1500–7800)
Neutrophils Relative %: 61.4 %
Platelets: 145 10*3/uL (ref 140–400)
RBC: 5.01 10*6/uL (ref 4.20–5.80)
RDW: 13.5 % (ref 11.0–15.0)
Total Lymphocyte: 27.1 %
WBC: 7.1 10*3/uL (ref 3.8–10.8)

## 2019-02-09 LAB — COMPLETE METABOLIC PANEL WITH GFR
AG Ratio: 1.3 (calc) (ref 1.0–2.5)
ALT: 16 U/L (ref 9–46)
AST: 13 U/L (ref 10–35)
Albumin: 3.7 g/dL (ref 3.6–5.1)
Alkaline phosphatase (APISO): 59 U/L (ref 35–144)
BUN: 18 mg/dL (ref 7–25)
CO2: 29 mmol/L (ref 20–32)
Calcium: 9.3 mg/dL (ref 8.6–10.3)
Chloride: 103 mmol/L (ref 98–110)
Creat: 0.97 mg/dL (ref 0.70–1.18)
GFR, Est African American: 88 mL/min/{1.73_m2} (ref >=60)
GFR, Est Non African American: 76 mL/min/{1.73_m2} (ref >=60)
Globulin: 2.8 g/dL (calc) (ref 1.9–3.7)
Glucose, Bld: 347 mg/dL — ABNORMAL HIGH (ref 65–139)
Potassium: 4.2 mmol/L (ref 3.5–5.3)
Sodium: 141 mmol/L (ref 135–146)
Total Bilirubin: 0.5 mg/dL (ref 0.2–1.2)
Total Protein: 6.5 g/dL (ref 6.1–8.1)

## 2019-02-09 LAB — HEMOGLOBIN A1C
Hgb A1c MFr Bld: 11.7 % of total Hgb — ABNORMAL HIGH (ref <5.7)
Mean Plasma Glucose: 289 (calc)
eAG (mmol/L): 16 (calc)

## 2019-03-11 ENCOUNTER — Other Ambulatory Visit: Payer: Self-pay | Admitting: Nurse Practitioner

## 2019-03-19 ENCOUNTER — Ambulatory Visit: Payer: Medicare Other | Admitting: Internal Medicine

## 2019-03-20 ENCOUNTER — Telehealth: Payer: Self-pay

## 2019-03-20 NOTE — Telephone Encounter (Signed)

## 2019-03-22 ENCOUNTER — Other Ambulatory Visit: Payer: Self-pay

## 2019-03-22 ENCOUNTER — Ambulatory Visit (INDEPENDENT_AMBULATORY_CARE_PROVIDER_SITE_OTHER): Payer: Medicare Other | Admitting: Pharmacist

## 2019-03-22 DIAGNOSIS — I4891 Unspecified atrial fibrillation: Secondary | ICD-10-CM | POA: Diagnosis not present

## 2019-03-22 DIAGNOSIS — Z5181 Encounter for therapeutic drug level monitoring: Secondary | ICD-10-CM

## 2019-03-22 LAB — POCT INR: INR: 1.8 — AB (ref 2.0–3.0)

## 2019-04-08 ENCOUNTER — Other Ambulatory Visit: Payer: Self-pay | Admitting: Nurse Practitioner

## 2019-04-08 DIAGNOSIS — E1143 Type 2 diabetes mellitus with diabetic autonomic (poly)neuropathy: Secondary | ICD-10-CM

## 2019-04-08 DIAGNOSIS — I10 Essential (primary) hypertension: Secondary | ICD-10-CM

## 2019-04-08 DIAGNOSIS — Z794 Long term (current) use of insulin: Secondary | ICD-10-CM

## 2019-04-08 NOTE — Telephone Encounter (Signed)
Appointment scheduled.

## 2019-04-08 NOTE — Telephone Encounter (Signed)
His A1c is not controlled. He will need to get A1c prior to next OV so we can discuss progress and current A1c, Okay to approve Rx however if diabetes continues to remain uncontrolled we will need to further discuss treatment plan to decrease risk for complications associated with uncontrolled diabetes due to poor glycemic control. Thank you.

## 2019-04-08 NOTE — Addendum Note (Signed)
Addended by: Logan Bores on: 04/08/2019 03:56 PM   Modules accepted: Orders

## 2019-04-08 NOTE — Telephone Encounter (Signed)
Per Seth [name] patient needs to reconsider Endo referral due to abnormal A1c. I called patient to inform him of Jessica's recommendation and patient refused referral, contacting Endo. Patient stated he has made changes and A1c should be better now.   Please advise

## 2019-04-08 NOTE — Addendum Note (Signed)
Addended by: Logan Bores on: 04/08/2019 04:28 PM   Modules accepted: Orders

## 2019-04-08 NOTE — Telephone Encounter (Signed)
Please have him scheduled for labs BEFORE APPT. Thank you.

## 2019-04-08 NOTE — Telephone Encounter (Signed)
Please advise on orders for pending appointment, patient is scheduled to get labs same day as follow-up

## 2019-05-03 DIAGNOSIS — N281 Cyst of kidney, acquired: Secondary | ICD-10-CM | POA: Diagnosis not present

## 2019-05-03 DIAGNOSIS — N5201 Erectile dysfunction due to arterial insufficiency: Secondary | ICD-10-CM | POA: Diagnosis not present

## 2019-05-03 DIAGNOSIS — E291 Testicular hypofunction: Secondary | ICD-10-CM | POA: Diagnosis not present

## 2019-05-03 DIAGNOSIS — Z8546 Personal history of malignant neoplasm of prostate: Secondary | ICD-10-CM | POA: Diagnosis not present

## 2019-05-03 DIAGNOSIS — R3121 Asymptomatic microscopic hematuria: Secondary | ICD-10-CM | POA: Diagnosis not present

## 2019-05-06 ENCOUNTER — Other Ambulatory Visit: Payer: Self-pay | Admitting: Nurse Practitioner

## 2019-05-06 DIAGNOSIS — Z794 Long term (current) use of insulin: Secondary | ICD-10-CM

## 2019-05-06 DIAGNOSIS — N200 Calculus of kidney: Secondary | ICD-10-CM | POA: Diagnosis not present

## 2019-05-06 DIAGNOSIS — E1143 Type 2 diabetes mellitus with diabetic autonomic (poly)neuropathy: Secondary | ICD-10-CM

## 2019-05-06 DIAGNOSIS — N281 Cyst of kidney, acquired: Secondary | ICD-10-CM | POA: Diagnosis not present

## 2019-05-06 DIAGNOSIS — K8080 Other cholelithiasis without obstruction: Secondary | ICD-10-CM | POA: Diagnosis not present

## 2019-05-07 ENCOUNTER — Other Ambulatory Visit: Payer: Medicare Other

## 2019-05-07 ENCOUNTER — Other Ambulatory Visit: Payer: Self-pay

## 2019-05-07 DIAGNOSIS — E1143 Type 2 diabetes mellitus with diabetic autonomic (poly)neuropathy: Secondary | ICD-10-CM

## 2019-05-07 DIAGNOSIS — Z794 Long term (current) use of insulin: Secondary | ICD-10-CM

## 2019-05-07 DIAGNOSIS — I10 Essential (primary) hypertension: Secondary | ICD-10-CM

## 2019-05-08 ENCOUNTER — Other Ambulatory Visit: Payer: Self-pay | Admitting: Cardiovascular Disease

## 2019-05-08 ENCOUNTER — Encounter: Payer: Self-pay | Admitting: *Deleted

## 2019-05-08 LAB — BASIC METABOLIC PANEL WITH GFR
BUN: 24 mg/dL (ref 7–25)
CO2: 30 mmol/L (ref 20–32)
Calcium: 9.1 mg/dL (ref 8.6–10.3)
Chloride: 105 mmol/L (ref 98–110)
Creat: 1.01 mg/dL (ref 0.70–1.18)
GFR, Est African American: 83 mL/min/{1.73_m2} (ref >=60)
GFR, Est Non African American: 72 mL/min/{1.73_m2} (ref >=60)
Glucose, Bld: 253 mg/dL — ABNORMAL HIGH (ref 65–99)
Potassium: 4 mmol/L (ref 3.5–5.3)
Sodium: 140 mmol/L (ref 135–146)

## 2019-05-08 LAB — HEMOGLOBIN A1C
Hgb A1c MFr Bld: 9.8 % of total Hgb — ABNORMAL HIGH (ref <5.7)
Mean Plasma Glucose: 235 (calc)
eAG (mmol/L): 13 (calc)

## 2019-05-08 LAB — HM DIABETES EYE EXAM

## 2019-05-09 ENCOUNTER — Other Ambulatory Visit: Payer: Self-pay

## 2019-05-09 ENCOUNTER — Encounter: Payer: Self-pay | Admitting: Nurse Practitioner

## 2019-05-09 ENCOUNTER — Ambulatory Visit (INDEPENDENT_AMBULATORY_CARE_PROVIDER_SITE_OTHER): Payer: Medicare Other | Admitting: Nurse Practitioner

## 2019-05-09 ENCOUNTER — Other Ambulatory Visit: Payer: Medicare Other

## 2019-05-09 DIAGNOSIS — E1143 Type 2 diabetes mellitus with diabetic autonomic (poly)neuropathy: Secondary | ICD-10-CM | POA: Diagnosis not present

## 2019-05-09 DIAGNOSIS — Z794 Long term (current) use of insulin: Secondary | ICD-10-CM

## 2019-05-09 DIAGNOSIS — E1129 Type 2 diabetes mellitus with other diabetic kidney complication: Secondary | ICD-10-CM

## 2019-05-09 DIAGNOSIS — N183 Chronic kidney disease, stage 3 unspecified: Secondary | ICD-10-CM

## 2019-05-09 DIAGNOSIS — R809 Proteinuria, unspecified: Secondary | ICD-10-CM | POA: Diagnosis not present

## 2019-05-09 DIAGNOSIS — C61 Malignant neoplasm of prostate: Secondary | ICD-10-CM | POA: Diagnosis not present

## 2019-05-09 DIAGNOSIS — I5022 Chronic systolic (congestive) heart failure: Secondary | ICD-10-CM | POA: Diagnosis not present

## 2019-05-09 DIAGNOSIS — I4891 Unspecified atrial fibrillation: Secondary | ICD-10-CM | POA: Diagnosis not present

## 2019-05-09 MED ORDER — INSULIN GLARGINE (1 UNIT DIAL) 300 UNIT/ML ~~LOC~~ SOPN: 60.0000 [IU] | PEN_INJECTOR | Freq: Every day | SUBCUTANEOUS | 1 refills | Status: DC

## 2019-05-09 NOTE — Progress Notes (Signed)
This service is provided via telemedicine  No vital signs collected/recorded due to the encounter was a telemedicine visit.   Location of patient (ex: home, work):  Work  Patient consents to a telephone visit:  Yes  Location of the provider (ex: office, home): [employer], Office   Name of any referring provider:  N/A  Names of all persons participating in the telemedicine service and their role in the encounter:  S.Chrae B/CMA, Sherrie Mustache, NP, and Patient   Time spent on call: 10 min with medical assistant     Careteam: Patient Care Team: Lauree Chandler, NP as PCP - General (Geriatric Medicine) [name], MD as PCP - Cardiology (Cardiology) [name], MD as Attending Physician (Urology) [name], MD as Consulting Physician (Nephrology) [name], [place] (Optometry)  Advanced Directive information Does Patient Have a Medical Advance Directive?: Yes, Type of Advance Directive: [place];Living will, Does patient want to make changes to medical advance directive?: No - Patient declined  No Known Allergies  Chief Complaint  Patient presents with   Medical Management of Chronic Issues    3 month follow-up and discuss labs( patient on mychart)    Immunizations    Discuss need for shingrix, patient never had chicken pox      HPI: Patient is a 78 y.o. male seen in the office today for routine follow up.   DM- A1c 11.7 3 months ago and discussed endocrine referral but he declines. He has been working on it but continues to eat poorly. Reports he has limited choices on what to eat. "know what I need to do, matter of doing it" Denies neuropathy, blurred vision, constipation.  He has increased toujeo to 55 but was instructed to go to 60 if blood sugars remained elevated however did not increase.  Blood sugars recently fasting have been in the 200s.  He is at work and not able to look at his blood sugar.    Continues to decline endocrinologist, "give me some more time" Continues on glipizide and metformin   Went to see Dr Jeffie Pollock, urologist on Monday (had to hold metformin for a few days). Following due to hx of Prostate Cancer   Hx of CKD, controlled, previously followed by nephrologist.   Hx of a fib, following coumadin clinic, reports he is following up every 5 weeks. No bleeding or bruising. No palpitations.  No chest pains.   CAD/ICM/HTN/HF - s/p 3V CABG- reports blood pressure is "a little high" 150s but does not have reading. Unsure what medication he takes for blood pressure "I have it wrote down somewhere" On his medication list states he is taking carvedilol 25 mg twice daily, lisinopril 40 mg twice daily and lasix 40 mg 2 tablet in the morning and 1 in the evening with potassium supplement and amlodipine 5 mg daily    Does not wish to have shringrix vaccine at this time.     Review of Systems:  Review of Systems  Constitutional: Negative for chills, diaphoresis and fever.  HENT: Negative for congestion, ear pain, hearing loss, sinus pain and sore throat.   Eyes: Negative for blurred vision, photophobia and pain.  Respiratory: Negative for cough, sputum production, shortness of breath and wheezing.   Cardiovascular: Negative for chest pain, palpitations, claudication and leg swelling.  Gastrointestinal: Negative for abdominal pain, blood in stool, constipation, diarrhea, heartburn, nausea and vomiting.  Genitourinary: Negative for frequency, hematuria and urgency.  Musculoskeletal: Negative for back  pain and myalgias.  Skin: Negative for rash.  Neurological: Negative for dizziness, tingling, sensory change, speech change, focal weakness, loss of consciousness, weakness and headaches.  Endo/Heme/Allergies: Bruises/bleeds easily.    Past Medical History:  Diagnosis Date   Atrial fibrillation ([place])    a. coumadin;  b. Amiodarone   CAD (coronary artery disease)    a.   Lexiscan Myoview (02/14/14):  Apical, inferolateral scar; ? Mild ischemia; EF 32%.;  b.  LHC (02/21/14):  dLM 100%, LAD 100%, CFX 100%, dRCA 50%. => CABG (L-LAD, S-OM, S-PDA)   Chronic systolic heart failure (HCC)    CKD (chronic kidney disease)    proteinuria   Hemorrhage of gastrointestinal tract, unspecified    Hemorrhage of rectum and anus    HLD (hyperlipidemia)    HTN (hypertension)    Impotence of organic origin    Ischemic cardiomyopathy    a. Echo (02/17/14):  EF 35-40%, anteroseptal and apical AK, Gr 2 DD, MAC, mod LAE.   Nephrolithiasis    Peripheral vascular disease, unspecified ([place])    Prostate cancer ([place])    Thrombocytopenia, unspecified ([place])    Tobacco use disorder    Type II or unspecified type diabetes mellitus with renal manifestations, uncontrolled(250.42)    Past Surgical History:  Procedure Laterality Date   BACK SURGERY     CATARACT EXTRACTION  2010   CLIPPING OF ATRIAL APPENDAGE Left 02/24/2014   Procedure: CLIPPING OF ATRIAL APPENDAGE;  Surgeon: Gaye Pollack, MD;  Location: [facility];  Service: Open Heart Surgery;  Laterality: Left;   COLONOSCOPY  2005   Normal   CORONARY ARTERY BYPASS GRAFT N/A 02/24/2014   Procedure: Coronary artery bypass graft times three using left internal mammary artery and left leg greater saphenous vein harvested endoscopically.;  Surgeon: Gaye Pollack, MD;  Location: [facility];  Service: Open Heart Surgery;  Laterality: N/A;   INTRAOPERATIVE TRANSESOPHAGEAL ECHOCARDIOGRAM N/A 02/24/2014   Procedure: INTRAOPERATIVE TRANSESOPHAGEAL ECHOCARDIOGRAM;  Surgeon: Gaye Pollack, MD;  Location: [facility] OR;  Service: Open Heart Surgery;  Laterality: N/A;   LEFT HEART CATHETERIZATION WITH CORONARY ANGIOGRAM N/A 02/21/2014   Procedure: LEFT HEART CATHETERIZATION WITH CORONARY ANGIOGRAM;  Surgeon: Peter M Martinique, MD;  Location: [facility] CATH LAB;  Service: Cardiovascular;  Laterality: N/A;   Social History:   reports that he has never smoked. He  quit smokeless tobacco use about 5 years ago.  His smokeless tobacco use included chew. He reports that he does not drink alcohol or use drugs.  Family History  Problem Relation Age of Onset   Cancer Mother 8       leukemia   Heart disease Father    Heart attack Father 82   Lung cancer Brother     Medications: Patient's Medications  New Prescriptions   No medications on file  Previous Medications   AMLODIPINE (NORVASC) 5 MG TABLET    Take 5 mg by mouth daily.   ASPIRIN EC 81 MG TABLET    Take 1 tablet (81 mg total) by mouth daily.   ATORVASTATIN (LIPITOR) 40 MG TABLET    TAKE ONE TABLET BY MOUTH DAILY   CALCIUM CARBONATE-VITAMIN D 600-200 MG-UNIT TABS    Take 1 tablet by mouth daily. For bones    CARVEDILOL (COREG) 25 MG TABLET    Take 25 mg by mouth 2 (two) times daily with a meal.   FLAXSEED, LINSEED, (FLAXSEED OIL PO)    Take 1 tablet by mouth daily.    FUROSEMIDE (  LASIX) 40 MG TABLET    TAKE TWO TABLETS BY MOUTH IN THE MORNING AND ONE EVERY EVENING   GLIPIZIDE (GLUCOTROL) 5 MG TABLET    TAKE TWO TABLETS BY MOUTH IN THE MORNING AND TAKE ONE TABLET BY MOUTH IN THE EVENING TO CONTROL BLOOD SUGAR   JANTOVEN 5 MG TABLET    TAKE 1 TABLET BY MOUTH ONCE DAILY OR AS DIRECTED BY COUMADIN CLINIC   LISINOPRIL (PRINIVIL,ZESTRIL) 40 MG TABLET    Take 40 mg by mouth 2 (two) times daily.   METFORMIN (GLUCOPHAGE) 1000 MG TABLET    TAKE ONE TABLET BY MOUTH TWICE DAILY with a meal   MULTIPLE VITAMIN (MULTIVITAMINS PO)    Take 1 tablet by mouth daily.     POTASSIUM CHLORIDE SA (K-DUR) 20 MEQ TABLET    TAKE ONE TABLET BY MOUTH ONE TIME DAILY    TOUJEO SOLOSTAR 300 UNIT/ML SOPN    INJECT 50 UNITS INTO SKIN DAILY  Modified Medications   No medications on file  Discontinued Medications   No medications on file     Physical Exam:    Labs reviewed: Basic Metabolic Panel: Recent Labs    11/02/18 0902 02/08/19 1055 05/07/19 0900  NA 140 141 140  K 3.9 4.2 4.0  CL 102 103 105  CO2 _0 GLUCOSE 276* 347* 253*  BUN _1 CREATININE 1.00 0.97 1.01  CALCIUM 9.0 9.3 9.1   Liver Function Tests: Recent Labs    08/03/18 0838 11/02/18 0902 02/08/19 1055  AST  --  14 13  ALT _2 BILITOT  --  0.7 0.5  PROT  --  6.3 6.5   No results for input(s): LIPASE, AMYLASE in the last 8760 hours. No results for input(s): AMMONIA in the last 8760 hours. CBC: Recent Labs    02/08/19 1055  WBC 7.1  NEUTROABS 4,359  HGB 14.2  HCT 41.7  MCV 83.2  PLT 145   Lipid Panel: Recent Labs    08/03/18 0838 11/02/18 0902  CHOL 103 98  HDL 36* 34*  LDLCALC 41 39  TRIG 187* 167*  CHOLHDL 2.9 2.9   TSH: No results for input(s): TSH in the last 8760 hours. A1C: Lab Results  Component Value Date   HGBA1C 9.8 (H) 05/07/2019     Assessment/Plan 1. Chronic systolic congestive heart failure (HCC) -stable, ongoing follow up by cardiologist, no chest pains, increase in edema or shortness of breath. Continues on lasix with potassium supplement, coreg twice daily, and lisinopril.   2. Type 2 diabetes mellitus with microalbuminuria, with long-term current use of insulin ([place]) Noted, he had been followed by nephrology but they released him  3. Atrial fibrillation, unspecified type ([place]) Stable, continues with coumadin clinic for anticoagulation.   4. Stage 3 chronic kidney disease ([place]) Stable on recent labs, again stressed importance of proper diabetic control   5. Prostate cancer ([place]) Stable, follow up with urologist yearly  6. Type 2 diabetes mellitus with diabetic autonomic neuropathy, with long-term current use of insulin (HCC) -remains uncontrolled, fasting blood sugars by report in the 200s. He has not been complaint with diet. Education provided on the importance of proper blood sugar control to reduce cardiovascular risk (pt with hx of MI), worsening renal disease, neuropathy and retinopathy. Pt is aware of complications associated with uncontrolled  diabetes and know what to do to control blood sugars. Again declines endocrine referral.  - Insulin Glargine, 1 Unit Dial, (  TOUJEO SOLOSTAR) 300 UNIT/ML SOPN; Inject 60 Units into the skin daily.  Dispense: 15 mL; Refill: 1  7. Hypertension Reports his blood pressure has been elevated recently, suspect this is due to poor dietary compliance and education for diet modification and DASH diet provided, pt did not have blood pressure readings but reports he will fax them to [place] when he can.   Next appt: 2 weeks for follow up on blood pressure and blood sugars.  Carlos American. Harle Battiest  [facility] [phone]    Virtual Visit via Telephone Note  I connected with@ on 05/09/19 at  9:30 AM EDT by telephone and verified that I am speaking with the correct person using two identifiers.  Location: Patient: work Provider: office   I discussed the limitations, risks, security and privacy concerns of performing an evaluation and management service by telephone and the availability of in person appointments. I also discussed with the patient that there may be a patient responsible charge related to this service. The patient expressed understanding and agreed to proceed.   I discussed the assessment and treatment plan with the patient. The patient was provided an opportunity to ask questions and all were answered. The patient agreed with the plan and demonstrated an understanding of the instructions.   The patient was advised to call back or seek an in-person evaluation if the symptoms worsen or if the condition fails to improve as anticipated.  I provided 23 minutes of non-face-to-face time during this encounter.  Carlos American. Harle Battiest Avs printed and mailed

## 2019-05-09 NOTE — Patient Instructions (Addendum)
increase toujeo to 60 units  Continue to work on diet.   Follow up in 2 weeks    DASH Eating Plan DASH stands for "Dietary Approaches to Stop Hypertension." The DASH eating plan is a healthy eating plan that has been shown to reduce high blood pressure (hypertension). It may also reduce your risk for type 2 diabetes, heart disease, and stroke. The DASH eating plan may also help with weight loss. What are tips for following this plan?  General guidelines  Avoid eating more than 2,300 mg (milligrams) of salt (sodium) a day. If you have hypertension, you may need to reduce your sodium intake to 1,500 mg a day.  Limit alcohol intake to no more than 1 drink a day for nonpregnant women and 2 drinks a day for men. One drink equals 12 oz of beer, 5 oz of wine, or 1 oz of hard liquor.  Work with your health care provider to maintain a healthy body weight or to lose weight. Ask what an ideal weight is for you.  Get at least 30 minutes of exercise that causes your heart to beat faster (aerobic exercise) most days of the week. Activities may include walking, swimming, or biking.  Work with your health care provider or diet and nutrition specialist (dietitian) to adjust your eating plan to your individual calorie needs. Reading food labels   Check food labels for the amount of sodium per serving. Choose foods with less than 5 percent of the Daily Value of sodium. Generally, foods with less than 300 mg of sodium per serving fit into this eating plan.  To find whole grains, look for the word "whole" as the first word in the ingredient list. Shopping  Buy products labeled as "low-sodium" or "no salt added."  Buy fresh foods. Avoid canned foods and premade or frozen meals. Cooking  Avoid adding salt when cooking. Use salt-free seasonings or herbs instead of table salt or sea salt. Check with your health care provider or pharmacist before using salt substitutes.  Do not fry foods. Cook foods using  healthy methods such as baking, boiling, grilling, and broiling instead.  Cook with heart-healthy oils, such as olive, canola, soybean, or sunflower oil. Meal planning  Eat a balanced diet that includes: ? 5 or more servings of fruits and vegetables each day. At each meal, try to fill half of your plate with fruits and vegetables. ? Up to 6-8 servings of whole grains each day. ? Less than 6 oz of lean meat, poultry, or fish each day. A 3-oz serving of meat is about the same size as a deck of cards. One egg equals 1 oz. ? 2 servings of low-fat dairy each day. ? A serving of nuts, seeds, or beans 5 times each week. ? Heart-healthy fats. Healthy fats called Omega-3 fatty acids are found in foods such as flaxseeds and coldwater fish, like sardines, salmon, and mackerel.  Limit how much you eat of the following: ? Canned or prepackaged foods. ? Food that is high in trans fat, such as fried foods. ? Food that is high in saturated fat, such as fatty meat. ? Sweets, desserts, sugary drinks, and other foods with added sugar. ? Full-fat dairy products.  Do not salt foods before eating.  Try to eat at least 2 vegetarian meals each week.  Eat more home-cooked food and less restaurant, buffet, and fast food.  When eating at a restaurant, ask that your food be prepared with less salt or no  salt, if possible. What foods are recommended? The items listed may not be a complete list. Talk with your dietitian about what dietary choices are best for you. Grains Whole-grain or whole-wheat bread. Whole-grain or whole-wheat pasta. Brown rice. [place]. Bulgur. Whole-grain and low-sodium cereals. Pita bread. Low-fat, low-sodium crackers. Whole-wheat flour tortillas. Vegetables Fresh or frozen vegetables (raw, steamed, roasted, or grilled). Low-sodium or reduced-sodium tomato and vegetable juice. Low-sodium or reduced-sodium tomato sauce and tomato paste. Low-sodium or reduced-sodium canned  vegetables. Fruits All fresh, dried, or frozen fruit. Canned fruit in natural juice (without added sugar). Meat and other protein foods Skinless chicken or [place]. Ground chicken or [place]. Pork with fat trimmed off. Fish and seafood. Egg whites. Dried beans, peas, or lentils. Unsalted nuts, nut butters, and seeds. Unsalted canned beans. Lean cuts of beef with fat trimmed off. Low-sodium, lean deli meat. Dairy Low-fat (1%) or fat-free (skim) milk. Fat-free, low-fat, or reduced-fat cheeses. Nonfat, low-sodium ricotta or cottage cheese. Low-fat or nonfat yogurt. Low-fat, low-sodium cheese. Fats and oils Soft margarine without trans fats. Vegetable oil. Low-fat, reduced-fat, or light mayonnaise and salad dressings (reduced-sodium). Canola, safflower, olive, soybean, and sunflower oils. Avocado. Seasoning and other foods Herbs. Spices. Seasoning mixes without salt. Unsalted popcorn and pretzels. Fat-free sweets. What foods are not recommended? The items listed may not be a complete list. Talk with your dietitian about what dietary choices are best for you. Grains Baked goods made with fat, such as croissants, muffins, or some breads. Dry pasta or rice meal packs. Vegetables Creamed or fried vegetables. Vegetables in a cheese sauce. Regular canned vegetables (not low-sodium or reduced-sodium). Regular canned tomato sauce and paste (not low-sodium or reduced-sodium). Regular tomato and vegetable juice (not low-sodium or reduced-sodium). Angie Fava. Olives. Fruits Canned fruit in a light or heavy syrup. Fried fruit. Fruit in cream or butter sauce. Meat and other protein foods Fatty cuts of meat. Ribs. Fried meat. Berniece Salines. Sausage. Bologna and other processed lunch meats. Salami. Fatback. Hotdogs. Bratwurst. Salted nuts and seeds. Canned beans with added salt. Canned or smoked fish. Whole eggs or egg yolks. Chicken or [place] with skin. Dairy Whole or 2% milk, cream, and half-and-half. Whole or full-fat  cream cheese. Whole-fat or sweetened yogurt. Full-fat cheese. Nondairy creamers. Whipped toppings. Processed cheese and cheese spreads. Fats and oils Butter. Stick margarine. Lard. Shortening. Ghee. Bacon fat. Tropical oils, such as coconut, palm kernel, or palm oil. Seasoning and other foods Salted popcorn and pretzels. Onion salt, garlic salt, seasoned salt, table salt, and sea salt. Worcestershire sauce. Tartar sauce. Barbecue sauce. Teriyaki sauce. Soy sauce, including reduced-sodium. Steak sauce. Canned and packaged gravies. Fish sauce. Oyster sauce. Cocktail sauce. Horseradish that you find on the shelf. Ketchup. Mustard. Meat flavorings and tenderizers. Bouillon cubes. Hot sauce and Tabasco sauce. Premade or packaged marinades. Premade or packaged taco seasonings. Relishes. Regular salad dressings. Where to find more information:  National Heart, Lung, and [place]: https://wilson-eaton.com/  American Heart Association: www.heart.org Summary  The DASH eating plan is a healthy eating plan that has been shown to reduce high blood pressure (hypertension). It may also reduce your risk for type 2 diabetes, heart disease, and stroke.  With the DASH eating plan, you should limit salt (sodium) intake to 2,300 mg a day. If you have hypertension, you may need to reduce your sodium intake to 1,500 mg a day.  When on the DASH eating plan, aim to eat more fresh fruits and vegetables, whole grains, lean proteins, low-fat dairy, and heart-healthy  fats.  Work with your health care provider or diet and nutrition specialist (dietitian) to adjust your eating plan to your individual calorie needs. This information is not intended to replace advice given to you by your health care provider. Make sure you discuss any questions you have with your health care provider. Document Released: 12/01/2011 Document Revised: 12/05/2016 Document Reviewed: 12/05/2016 Elsevier Interactive Patient Education  2019 [employer].

## 2019-05-09 NOTE — Telephone Encounter (Signed)
Hold for 5/22 appt

## 2019-05-15 ENCOUNTER — Telehealth: Payer: Self-pay

## 2019-05-15 NOTE — Telephone Encounter (Signed)

## 2019-05-17 ENCOUNTER — Other Ambulatory Visit: Payer: Self-pay

## 2019-05-17 ENCOUNTER — Ambulatory Visit (INDEPENDENT_AMBULATORY_CARE_PROVIDER_SITE_OTHER): Payer: Medicare Other | Admitting: Pharmacist

## 2019-05-17 DIAGNOSIS — Z5181 Encounter for therapeutic drug level monitoring: Secondary | ICD-10-CM

## 2019-05-17 DIAGNOSIS — I4891 Unspecified atrial fibrillation: Secondary | ICD-10-CM | POA: Diagnosis not present

## 2019-05-17 LAB — POCT INR: INR: 2.8 (ref 2.0–3.0)

## 2019-05-23 ENCOUNTER — Ambulatory Visit (INDEPENDENT_AMBULATORY_CARE_PROVIDER_SITE_OTHER): Payer: Medicare Other | Admitting: Nurse Practitioner

## 2019-05-23 ENCOUNTER — Other Ambulatory Visit: Payer: Self-pay

## 2019-05-23 ENCOUNTER — Encounter: Payer: Self-pay | Admitting: Nurse Practitioner

## 2019-05-23 ENCOUNTER — Encounter: Payer: Medicare Other | Admitting: Nurse Practitioner

## 2019-05-23 VITALS — BP 126/70 | HR 73 | Temp 98.2°F | Ht 68.0 in | Wt 249.0 lb

## 2019-05-23 DIAGNOSIS — I10 Essential (primary) hypertension: Secondary | ICD-10-CM | POA: Diagnosis not present

## 2019-05-23 DIAGNOSIS — Z794 Long term (current) use of insulin: Secondary | ICD-10-CM

## 2019-05-23 DIAGNOSIS — R809 Proteinuria, unspecified: Secondary | ICD-10-CM

## 2019-05-23 DIAGNOSIS — N183 Chronic kidney disease, stage 3 unspecified: Secondary | ICD-10-CM

## 2019-05-23 DIAGNOSIS — E1143 Type 2 diabetes mellitus with diabetic autonomic (poly)neuropathy: Secondary | ICD-10-CM | POA: Diagnosis not present

## 2019-05-23 DIAGNOSIS — E1129 Type 2 diabetes mellitus with other diabetic kidney complication: Secondary | ICD-10-CM

## 2019-05-23 DIAGNOSIS — Z6837 Body mass index (BMI) 37.0-37.9, adult: Secondary | ICD-10-CM

## 2019-05-23 DIAGNOSIS — Z0000 Encounter for general adult medical examination without abnormal findings: Secondary | ICD-10-CM | POA: Diagnosis not present

## 2019-05-23 MED ORDER — INSULIN GLARGINE (1 UNIT DIAL) 300 UNIT/ML ~~LOC~~ SOPN: 65.0000 [IU] | PEN_INJECTOR | Freq: Every day | SUBCUTANEOUS | 1 refills | Status: DC

## 2019-05-23 MED ORDER — EMPAGLIFLOZIN 10 MG PO TABS: 10.0000 mg | ORAL_TABLET | Freq: Every day | ORAL | 2 refills | Status: DC

## 2019-05-23 NOTE — Progress Notes (Signed)
Careteam: Patient Care Team: Lauree Chandler, NP as PCP - General (Geriatric Medicine) [name], MD as PCP - Cardiology (Cardiology) [name], MD as Attending Physician (Urology) [name], MD as Consulting Physician (Nephrology) [name], [place] (Optometry)  Advanced Directive information    No Known Allergies  Chief Complaint  Patient presents with  . Follow-up    2 week follow-up on blood pressure and blood sugar      HPI: Patient is a 79 y.o. male seen in the office today to follow up blood sugars  DM- declined endocrine referral.  Increased toujeo to 60 from 55 units. Blood sugars FASTING 200-259. No low blood sugars. Taking metformin 1000 mg by mouth twice daily.  Reports diet remain poor. Struggling with diet.   Getting on treadmill 30 mins 5 days a week in the evening. Very slow pace due to his legs. Has been doing this for "a good while" for over a year.   Review of Systems:  Review of Systems  Constitutional: Negative for chills, diaphoresis and fever.  Eyes: Negative for blurred vision, photophobia and pain.  Respiratory: Negative for cough, shortness of breath and wheezing.   Cardiovascular: Negative for chest pain, palpitations, claudication and leg swelling.  Gastrointestinal: Negative for abdominal pain, blood in stool, constipation, diarrhea, heartburn, nausea and vomiting.  Musculoskeletal: Negative for back pain and myalgias.  Skin: Negative for rash.  Neurological: Negative for dizziness, tingling, sensory change, speech change and headaches.  Endo/Heme/Allergies: Bruises/bleeds easily.    Past Medical History:  Diagnosis Date  . Atrial fibrillation (HCC)    a. coumadin;  b. Amiodarone  . CAD (coronary artery disease)    a.  Lexiscan Myoview (02/14/14):  Apical, inferolateral scar; ? Mild ischemia; EF 32%.;  b.  LHC (02/21/14):  dLM 100%, LAD 100%, CFX 100%, dRCA 50%. => CABG (L-LAD, S-OM, S-PDA)  . Chronic  systolic heart failure ([place])   . CKD (chronic kidney disease)    proteinuria  . Hemorrhage of gastrointestinal tract, unspecified   . Hemorrhage of rectum and anus   . HLD (hyperlipidemia)   . HTN (hypertension)   . Impotence of organic origin   . Ischemic cardiomyopathy    a. Echo (02/17/14):  EF 35-40%, anteroseptal and apical AK, Gr 2 DD, MAC, mod LAE.  [name] Nephrolithiasis   . Peripheral vascular disease, unspecified ([place])   . Prostate cancer ([place])   . Thrombocytopenia, unspecified ([place])   . Tobacco use disorder   . Type II or unspecified type diabetes mellitus with renal manifestations, uncontrolled(250.42)    Past Surgical History:  Procedure Laterality Date  . BACK SURGERY    . CATARACT EXTRACTION  2010  . CLIPPING OF ATRIAL APPENDAGE Left 02/24/2014   Procedure: CLIPPING OF ATRIAL APPENDAGE;  Surgeon: Gaye Pollack, MD;  Location: [facility];  Service: Open Heart Surgery;  Laterality: Left;  . COLONOSCOPY  2005   Normal  . CORONARY ARTERY BYPASS GRAFT N/A 02/24/2014   Procedure: Coronary artery bypass graft times three using left internal mammary artery and left leg greater saphenous vein harvested endoscopically.;  Surgeon: Gaye Pollack, MD;  Location: [facility] OR;  Service: Open Heart Surgery;  Laterality: N/A;  . INTRAOPERATIVE TRANSESOPHAGEAL ECHOCARDIOGRAM N/A 02/24/2014   Procedure: INTRAOPERATIVE TRANSESOPHAGEAL ECHOCARDIOGRAM;  Surgeon: Gaye Pollack, MD;  Location: [facility] OR;  Service: Open Heart Surgery;  Laterality: N/A;  . LEFT HEART CATHETERIZATION WITH CORONARY ANGIOGRAM N/A 02/21/2014   Procedure: LEFT HEART CATHETERIZATION WITH  CORONARY ANGIOGRAM;  Surgeon: Peter M Martinique, MD;  Location: [facility] CATH LAB;  Service: Cardiovascular;  Laterality: N/A;   Social History:   reports that he has never smoked. He quit smokeless tobacco use about 5 years ago.  His smokeless tobacco use included chew. He reports that he does not drink alcohol or use drugs.  Family History  Problem Relation Age  of Onset  . Cancer Mother 75       leukemia  . Heart disease Father   . Heart attack Father 97  . Lung cancer Brother     Medications: Patient's Medications  New Prescriptions   No medications on file  Previous Medications   AMLODIPINE (NORVASC) 5 MG TABLET    Take 5 mg by mouth daily.   ASPIRIN EC 81 MG TABLET    Take 1 tablet (81 mg total) by mouth daily.   ATORVASTATIN (LIPITOR) 40 MG TABLET    TAKE ONE TABLET BY MOUTH DAILY   CALCIUM CARBONATE-VITAMIN D 600-200 MG-UNIT TABS    Take 1 tablet by mouth daily. For bones    CARVEDILOL (COREG) 25 MG TABLET    Take 25 mg by mouth 2 (two) times daily with a meal.   FLAXSEED, LINSEED, (FLAXSEED OIL PO)    Take 1 tablet by mouth daily.    FUROSEMIDE (LASIX) 40 MG TABLET    TAKE TWO TABLETS BY MOUTH IN THE MORNING AND ONE EVERY EVENING   GLIPIZIDE (GLUCOTROL) 5 MG TABLET    TAKE TWO TABLETS BY MOUTH IN THE MORNING AND TAKE ONE TABLET BY MOUTH IN THE EVENING TO CONTROL BLOOD SUGAR   INSULIN GLARGINE, 1 UNIT DIAL, (TOUJEO SOLOSTAR) 300 UNIT/ML SOPN    Inject 60 Units into the skin daily.   LISINOPRIL (PRINIVIL,ZESTRIL) 40 MG TABLET    Take 40 mg by mouth 2 (two) times daily.   METFORMIN (GLUCOPHAGE) 1000 MG TABLET    TAKE ONE TABLET BY MOUTH TWICE DAILY with a meal   MULTIPLE VITAMIN (MULTIVITAMINS PO)    Take 1 tablet by mouth daily.     POTASSIUM CHLORIDE SA (K-DUR) 20 MEQ TABLET    TAKE ONE TABLET BY MOUTH ONE TIME DAILY    WARFARIN (COUMADIN) 5 MG TABLET    Take 1.5 tablets daily (7.23m) or as directed by [facility].  Modified Medications   No medications on file  Discontinued Medications   No medications on file     Physical Exam:  Vitals:   05/23/19 0837  BP: 126/70  Pulse: 73  Temp: 98.2 F (36.8 C)  TempSrc: Oral  SpO2: 94%  Weight: 249 lb (112.9 kg)  Height: _0  (1.727 m)   Body mass index is 37.86 kg/m.  Physical Exam Constitutional:      Appearance: Normal appearance. He is obese.  HENT:     Head:  Normocephalic and atraumatic.     Nose: Nose normal.  Eyes:     Extraocular Movements: Extraocular movements intact.     Conjunctiva/sclera: Conjunctivae normal.     Pupils: Pupils are equal, round, and reactive to light.  Neck:     Musculoskeletal: Normal range of motion.  Cardiovascular:     Rate and Rhythm: Rhythm irregular.     Pulses: Normal pulses.  Pulmonary:     Effort: Pulmonary effort is normal.     Breath sounds: Normal breath sounds.  Abdominal:     General: Abdomen is flat. Bowel sounds are normal.     Palpations: Abdomen  is soft.  Musculoskeletal:        General: No tenderness.     Right lower leg: No edema.     Left lower leg: No edema.  Skin:    General: Skin is warm and dry.     Capillary Refill: Capillary refill takes less than 2 seconds.     Findings: No rash.  Neurological:     General: No focal deficit present.     Mental Status: He is alert and oriented to person, place, and time. Mental status is at baseline.  Psychiatric:        Mood and Affect: Mood normal.        Behavior: Behavior normal.        Thought Content: Thought content normal.     Labs reviewed: Basic Metabolic Panel: Recent Labs    11/02/18 0902 02/08/19 1055 05/07/19 0900  NA 140 141 140  K 3.9 4.2 4.0  CL 102 103 105  CO2 _0 GLUCOSE 276* 347* 253*  BUN _1 CREATININE 1.00 0.97 1.01  CALCIUM 9.0 9.3 9.1   Liver Function Tests: Recent Labs    08/03/18 0838 11/02/18 0902 02/08/19 1055  AST  --  14 13  ALT _2 BILITOT  --  0.7 0.5  PROT  --  6.3 6.5   No results for input(s): LIPASE, AMYLASE in the last 8760 hours. No results for input(s): AMMONIA in the last 8760 hours. CBC: Recent Labs    02/08/19 1055  WBC 7.1  NEUTROABS 4,359  HGB 14.2  HCT 41.7  MCV 83.2  PLT 145   Lipid Panel: Recent Labs    08/03/18 0838 11/02/18 0902  CHOL 103 98  HDL 36* 34*  LDLCALC 41 39  TRIG 187* 167*  CHOLHDL 2.9 2.9   TSH: No results for  input(s): TSH in the last 8760 hours. A1C: Lab Results  Component Value Date   HGBA1C 9.8 (H) 05/07/2019     Assessment/Plan 1. Type 2 diabetes mellitus with microalbuminuria, with long-term current use of insulin (HCC) -blood sugar is unchanged based on recent readings with increase in toujeo. Will increase toujeo to 65 units at this time. Also encouraged to focus on diet modifications and try to make changes with diet.  Does not wish for dietitian referral. States he would like to work on things himself.  -encouraged to start additional medication as well as lifestyle modification however he would like to check pricing before.  - empagliflozin (JARDIANCE) 10 MG TABS tablet; Take 10 mg by mouth daily.  Dispense: 30 tablet; Refill: 2  2. Stage 3 chronic kidney disease ([place]) Noted, encouraged proper control of diabetes for progressive disease.  3. Class 2 severe obesity due to excess calories with serious comorbidity and body mass index (BMI) of 37.0 to 37.9 in adult [facility]) Noted, encouraged diet modifications and exercise. Encouraged to increase activity as tolerates as well as working on diet.   4. Hypertension, benign -stable, blood pressure readings reviewed from home and higher than in office reading, encouraged to bring blood pressure cuff in to correlate.   5. Type 2 diabetes mellitus with diabetic autonomic neuropathy, with long-term current use of insulin ([place]) See number 1 - Insulin Glargine, 1 Unit Dial, (TOUJEO SOLOSTAR) 300 UNIT/ML SOPN; Inject 65 Units into the skin daily.  Dispense: 15 mL; Refill: 1  Next appt: 2 weeks for blood sugar review.  Carlos American. Harle Battiest  [employer]  [facility] [phone]

## 2019-05-23 NOTE — Patient Instructions (Addendum)
INCREASE TOUJEO TO 65 units Please look into Jardiance 10 mg by mouth daily, this is also beneficial for reducing risk of cardiovascular events  Increase physical activity.   Diabetes Mellitus and Nutrition, Adult When you have diabetes (diabetes mellitus), it is very important to have healthy eating habits because your blood sugar (glucose) levels are greatly affected by what you eat and drink. Eating healthy foods in the appropriate amounts, at about the same times every day, can help you:  Control your blood glucose.  Lower your risk of heart disease.  Improve your blood pressure.  Reach or maintain a healthy weight. Every person with diabetes is different, and each person has different needs for a meal plan. Your health care provider may recommend that you work with a diet and nutrition specialist (dietitian) to make a meal plan that is best for you. Your meal plan may vary depending on factors such as:  The calories you need.  The medicines you take.  Your weight.  Your blood glucose, blood pressure, and cholesterol levels.  Your activity level.  Other health conditions you have, such as heart or kidney disease. How do carbohydrates affect me? Carbohydrates, also called carbs, affect your blood glucose level more than any other type of food. Eating carbs naturally raises the amount of glucose in your blood. Carb counting is a method for keeping track of how many carbs you eat. Counting carbs is important to keep your blood glucose at a healthy level, especially if you use insulin or take certain oral diabetes medicines. It is important to know how many carbs you can safely have in each meal. This is different for every person. Your dietitian can help you calculate how many carbs you should have at each meal and for each snack. Foods that contain carbs include:  Bread, cereal, rice, pasta, and crackers.  Potatoes and corn.  Peas, beans, and lentils.  Milk and yogurt.   Fruit and juice.  Desserts, such as cakes, cookies, ice cream, and candy. How does alcohol affect me? Alcohol can cause a sudden decrease in blood glucose (hypoglycemia), especially if you use insulin or take certain oral diabetes medicines. Hypoglycemia can be a life-threatening condition. Symptoms of hypoglycemia (sleepiness, dizziness, and confusion) are similar to symptoms of having too much alcohol. If your health care provider says that alcohol is safe for you, follow these guidelines:  Limit alcohol intake to no more than 1 drink per day for nonpregnant women and 2 drinks per day for men. One drink equals 12 oz of beer, 5 oz of wine, or 1 oz of hard liquor.  Do not drink on an empty stomach.  Keep yourself hydrated with water, diet soda, or unsweetened iced tea.  Keep in mind that regular soda, juice, and other mixers may contain a lot of sugar and must be counted as carbs. What are tips for following this plan?  Reading food labels  Start by checking the serving size on the "Nutrition Facts" label of packaged foods and drinks. The amount of calories, carbs, fats, and other nutrients listed on the label is based on one serving of the item. Many items contain more than one serving per package.  Check the total grams (g) of carbs in one serving. You can calculate the number of servings of carbs in one serving by dividing the total carbs by 15. For example, if a food has 30 g of total carbs, it would be equal to 2 servings of carbs.  Check the number of grams (g) of saturated and trans fats in one serving. Choose foods that have low or no amount of these fats.  Check the number of milligrams (mg) of salt (sodium) in one serving. Most people should limit total sodium intake to less than 2,300 mg per day.  Always check the nutrition information of foods labeled as "low-fat" or "nonfat". These foods may be higher in added sugar or refined carbs and should be avoided.  Talk to your  dietitian to identify your daily goals for nutrients listed on the label. Shopping  Avoid buying canned, premade, or processed foods. These foods tend to be high in fat, sodium, and added sugar.  Shop around the outside edge of the grocery store. This includes fresh fruits and vegetables, bulk grains, fresh meats, and fresh dairy. Cooking  Use low-heat cooking methods, such as baking, instead of high-heat cooking methods like deep frying.  Cook using healthy oils, such as olive, canola, or sunflower oil.  Avoid cooking with butter, cream, or high-fat meats. Meal planning  Eat meals and snacks regularly, preferably at the same times every day. Avoid going long periods of time without eating.  Eat foods high in fiber, such as fresh fruits, vegetables, beans, and whole grains. Talk to your dietitian about how many servings of carbs you can eat at each meal.  Eat 4-6 ounces (oz) of lean protein each day, such as lean meat, chicken, fish, eggs, or tofu. One oz of lean protein is equal to: ? 1 oz of meat, chicken, or fish. ? 1 egg. ?  cup of tofu.  Eat some foods each day that contain healthy fats, such as avocado, nuts, seeds, and fish. Lifestyle  Check your blood glucose regularly.  Exercise regularly as told by your health care provider. This may include: ? 150 minutes of moderate-intensity or vigorous-intensity exercise each week. This could be brisk walking, biking, or water aerobics. ? Stretching and doing strength exercises, such as yoga or weightlifting, at least 2 times a week.  Take medicines as told by your health care provider.  Do not use any products that contain nicotine or tobacco, such as cigarettes and e-cigarettes. If you need help quitting, ask your health care provider.  Work with a Social worker or diabetes educator to identify strategies to manage stress and any emotional and social challenges. Questions to ask a health care provider  Do I need to meet with a  diabetes educator?  Do I need to meet with a dietitian?  What number can I call if I have questions?  When are the best times to check my blood glucose? Where to find more information:  American Diabetes Association: diabetes.org  Academy of Nutrition and Dietetics: www.eatright.[employer] of Diabetes and Digestive and Kidney Diseases (NIH): [employer].dk Summary  A healthy meal plan will help you control your blood glucose and maintain a healthy lifestyle.  Working with a diet and nutrition specialist (dietitian) can help you make a meal plan that is best for you.  Keep in mind that carbohydrates (carbs) and alcohol have immediate effects on your blood glucose levels. It is important to count carbs and to use alcohol carefully. This information is not intended to replace advice given to you by your health care provider. Make sure you discuss any questions you have with your health care provider. Document Released: 09/08/2005 Document Revised: 07/12/2017 Document Reviewed: 01/16/2017 Elsevier Interactive Patient Education  2019 [employer]  Eating Plan DASH stands for "Dietary Approaches to Stop Hypertension." The DASH eating plan is a healthy eating plan that has been shown to reduce high blood pressure (hypertension). It may also reduce your risk for type 2 diabetes, heart disease, and stroke. The DASH eating plan may also help with weight loss. What are tips for following this plan?  General guidelines  Avoid eating more than 2,300 mg (milligrams) of salt (sodium) a day. If you have hypertension, you may need to reduce your sodium intake to 1,500 mg a day.  Limit alcohol intake to no more than 1 drink a day for nonpregnant women and 2 drinks a day for men. One drink equals 12 oz of beer, 5 oz of wine, or 1 oz of hard liquor.  Work with your health care provider to maintain a healthy body weight or to lose weight. Ask what an ideal weight is for  you.  Get at least 30 minutes of exercise that causes your heart to beat faster (aerobic exercise) most days of the week. Activities may include walking, swimming, or biking.  Work with your health care provider or diet and nutrition specialist (dietitian) to adjust your eating plan to your individual calorie needs. Reading food labels   Check food labels for the amount of sodium per serving. Choose foods with less than 5 percent of the Daily Value of sodium. Generally, foods with less than 300 mg of sodium per serving fit into this eating plan.  To find whole grains, look for the word "whole" as the first word in the ingredient list. Shopping  Buy products labeled as "low-sodium" or "no salt added."  Buy fresh foods. Avoid canned foods and premade or frozen meals. Cooking  Avoid adding salt when cooking. Use salt-free seasonings or herbs instead of table salt or sea salt. Check with your health care provider or pharmacist before using salt substitutes.  Do not fry foods. Cook foods using healthy methods such as baking, boiling, grilling, and broiling instead.  Cook with heart-healthy oils, such as olive, canola, soybean, or sunflower oil. Meal planning  Eat a balanced diet that includes: ? 5 or more servings of fruits and vegetables each day. At each meal, try to fill half of your plate with fruits and vegetables. ? Up to 6-8 servings of whole grains each day. ? Less than 6 oz of lean meat, poultry, or fish each day. A 3-oz serving of meat is about the same size as a deck of cards. One egg equals 1 oz. ? 2 servings of low-fat dairy each day. ? A serving of nuts, seeds, or beans 5 times each week. ? Heart-healthy fats. Healthy fats called Omega-3 fatty acids are found in foods such as flaxseeds and coldwater fish, like sardines, salmon, and mackerel.  Limit how much you eat of the following: ? Canned or prepackaged foods. ? Food that is high in trans fat, such as fried foods. ?  Food that is high in saturated fat, such as fatty meat. ? Sweets, desserts, sugary drinks, and other foods with added sugar. ? Full-fat dairy products.  Do not salt foods before eating.  Try to eat at least 2 vegetarian meals each week.  Eat more home-cooked food and less restaurant, buffet, and fast food.  When eating at a restaurant, ask that your food be prepared with less salt or no salt, if possible. What foods are recommended? The items listed may not be a complete list. Talk with your dietitian about  what dietary choices are best for you. Grains Whole-grain or whole-wheat bread. Whole-grain or whole-wheat pasta. Brown rice. [place]. Bulgur. Whole-grain and low-sodium cereals. Pita bread. Low-fat, low-sodium crackers. Whole-wheat flour tortillas. Vegetables Fresh or frozen vegetables (raw, steamed, roasted, or grilled). Low-sodium or reduced-sodium tomato and vegetable juice. Low-sodium or reduced-sodium tomato sauce and tomato paste. Low-sodium or reduced-sodium canned vegetables. Fruits All fresh, dried, or frozen fruit. Canned fruit in natural juice (without added sugar). Meat and other protein foods Skinless chicken or [place]. Ground chicken or [place]. Pork with fat trimmed off. Fish and seafood. Egg whites. Dried beans, peas, or lentils. Unsalted nuts, nut butters, and seeds. Unsalted canned beans. Lean cuts of beef with fat trimmed off. Low-sodium, lean deli meat. Dairy Low-fat (1%) or fat-free (skim) milk. Fat-free, low-fat, or reduced-fat cheeses. Nonfat, low-sodium ricotta or cottage cheese. Low-fat or nonfat yogurt. Low-fat, low-sodium cheese. Fats and oils Soft margarine without trans fats. Vegetable oil. Low-fat, reduced-fat, or light mayonnaise and salad dressings (reduced-sodium). Canola, safflower, olive, soybean, and sunflower oils. Avocado. Seasoning and other foods Herbs. Spices. Seasoning mixes without salt. Unsalted popcorn and pretzels. Fat-free sweets.  What foods are not recommended? The items listed may not be a complete list. Talk with your dietitian about what dietary choices are best for you. Grains Baked goods made with fat, such as croissants, muffins, or some breads. Dry pasta or rice meal packs. Vegetables Creamed or fried vegetables. Vegetables in a cheese sauce. Regular canned vegetables (not low-sodium or reduced-sodium). Regular canned tomato sauce and paste (not low-sodium or reduced-sodium). Regular tomato and vegetable juice (not low-sodium or reduced-sodium). Angie Fava. Olives. Fruits Canned fruit in a light or heavy syrup. Fried fruit. Fruit in cream or butter sauce. Meat and other protein foods Fatty cuts of meat. Ribs. Fried meat. Berniece Salines. Sausage. Bologna and other processed lunch meats. Salami. Fatback. Hotdogs. Bratwurst. Salted nuts and seeds. Canned beans with added salt. Canned or smoked fish. Whole eggs or egg yolks. Chicken or [place] with skin. Dairy Whole or 2% milk, cream, and half-and-half. Whole or full-fat cream cheese. Whole-fat or sweetened yogurt. Full-fat cheese. Nondairy creamers. Whipped toppings. Processed cheese and cheese spreads. Fats and oils Butter. Stick margarine. Lard. Shortening. Ghee. Bacon fat. Tropical oils, such as coconut, palm kernel, or palm oil. Seasoning and other foods Salted popcorn and pretzels. Onion salt, garlic salt, seasoned salt, table salt, and sea salt. Worcestershire sauce. Tartar sauce. Barbecue sauce. Teriyaki sauce. Soy sauce, including reduced-sodium. Steak sauce. Canned and packaged gravies. Fish sauce. Oyster sauce. Cocktail sauce. Horseradish that you find on the shelf. Ketchup. Mustard. Meat flavorings and tenderizers. Bouillon cubes. Hot sauce and Tabasco sauce. Premade or packaged marinades. Premade or packaged taco seasonings. Relishes. Regular salad dressings. Where to find more information:  National Heart, Lung, and [place]: https://wilson-eaton.com/  American Heart  Association: www.heart.org Summary  The DASH eating plan is a healthy eating plan that has been shown to reduce high blood pressure (hypertension). It may also reduce your risk for type 2 diabetes, heart disease, and stroke.  With the DASH eating plan, you should limit salt (sodium) intake to 2,300 mg a day. If you have hypertension, you may need to reduce your sodium intake to 1,500 mg a day.  When on the DASH eating plan, aim to eat more fresh fruits and vegetables, whole grains, lean proteins, low-fat dairy, and heart-healthy fats.  Work with your health care provider or diet and nutrition specialist (dietitian) to adjust your eating plan to your  individual calorie needs. This information is not intended to replace advice given to you by your health care provider. Make sure you discuss any questions you have with your health care provider. Document Released: 12/01/2011 Document Revised: 12/05/2016 Document Reviewed: 12/05/2016 Elsevier Interactive Patient Education  2019 [employer].

## 2019-05-23 NOTE — Progress Notes (Signed)
Subjective:   Seth [name] is a 78 y.o. male who presents for Medicare Annual/Subsequent preventive examination.  Review of Systems:        Objective:    Vitals: BP 126/70   Pulse 73   Temp 98.2 F (36.8 C) (Oral)   Ht _0  (1.727 m)   Wt 249 lb (112.9 kg)   SpO2 94%   BMI 37.86 kg/m   Body mass index is 37.86 kg/m.  Advanced Directives 05/23/2019 05/09/2019 11/08/2018 02/06/2018 07/25/2017 12/16/2016 09/16/2016  Does Patient Have a Medical Advance Directive? Yes Yes No Yes Yes Yes Yes  Type of Paramedic of [place];Living will [place];Living will - Living will Healthcare Power of [place];Living will  Does patient want to make changes to medical advance directive? No - Patient declined No - Patient declined - - - - No - Patient declined  Copy of [place] in Chart? No - copy requested No - copy requested - - No - copy requested No - copy requested No - copy requested  Would patient like information on creating a medical advance directive? - - - - - - -  Pre-existing out of facility DNR order (yellow form or pink MOST form) - - - - - - -    Tobacco Social History   Tobacco Use  Smoking Status Never Smoker  Smokeless Tobacco Former Systems developer  . Types: Chew     Counseling given: Not Answered   Clinical Intake:                       Past Medical History:  Diagnosis Date  . Atrial fibrillation (HCC)    a. coumadin;  b. Amiodarone  . CAD (coronary artery disease)    a.  Lexiscan Myoview (02/14/14):  Apical, inferolateral scar; ? Mild ischemia; EF 32%.;  b.  LHC (02/21/14):  dLM 100%, LAD 100%, CFX 100%, dRCA 50%. => CABG (L-LAD, S-OM, S-PDA)  . Chronic systolic heart failure ([place])   . CKD (chronic kidney disease)    proteinuria  . Hemorrhage of gastrointestinal tract, unspecified   . Hemorrhage of rectum and anus   . HLD  (hyperlipidemia)   . HTN (hypertension)   . Impotence of organic origin   . Ischemic cardiomyopathy    a. Echo (02/17/14):  EF 35-40%, anteroseptal and apical AK, Gr 2 DD, MAC, mod LAE.  [name] Nephrolithiasis   . Peripheral vascular disease, unspecified ([place])   . Prostate cancer ([place])   . Thrombocytopenia, unspecified ([place])   . Tobacco use disorder   . Type II or unspecified type diabetes mellitus with renal manifestations, uncontrolled(250.42)    Past Surgical History:  Procedure Laterality Date  . BACK SURGERY    . CATARACT EXTRACTION  2010  . CLIPPING OF ATRIAL APPENDAGE Left 02/24/2014   Procedure: CLIPPING OF ATRIAL APPENDAGE;  Surgeon: Gaye Pollack, MD;  Location: [facility];  Service: Open Heart Surgery;  Laterality: Left;  . COLONOSCOPY  2005   Normal  . CORONARY ARTERY BYPASS GRAFT N/A 02/24/2014   Procedure: Coronary artery bypass graft times three using left internal mammary artery and left leg greater saphenous vein harvested endoscopically.;  Surgeon: Gaye Pollack, MD;  Location: [facility] OR;  Service: Open Heart Surgery;  Laterality: N/A;  . INTRAOPERATIVE TRANSESOPHAGEAL ECHOCARDIOGRAM N/A 02/24/2014   Procedure: INTRAOPERATIVE TRANSESOPHAGEAL ECHOCARDIOGRAM;  Surgeon: Gaye Pollack, MD;  Location: [facility] OR;  Service: Open Heart Surgery;  Laterality: N/A;  . LEFT HEART CATHETERIZATION WITH CORONARY ANGIOGRAM N/A 02/21/2014   Procedure: LEFT HEART CATHETERIZATION WITH CORONARY ANGIOGRAM;  Surgeon: Peter M Martinique, MD;  Location: [facility] CATH LAB;  Service: Cardiovascular;  Laterality: N/A;   Family History  Problem Relation Age of Onset  . Cancer Mother 41       leukemia  . Heart disease Father   . Heart attack Father 63  . Lung cancer Brother    Social History   Socioeconomic History  . Marital status: Married    Spouse name: Not on file  . Number of children: 3  . Years of education: Not on file  . Highest education level: Not on file  Occupational History  . Occupation: Manufacturing engineer  buildings  Social Needs  . Financial resource strain: Not on file  . Food insecurity:    Worry: Not on file    Inability: Not on file  . Transportation needs:    Medical: Not on file    Non-medical: Not on file  Tobacco Use  . Smoking status: Never Smoker  . Smokeless tobacco: Former Systems developer    Types: Chew  Substance and Sexual Activity  . Alcohol use: No  . Drug use: No  . Sexual activity: Never  Lifestyle  . Physical activity:    Days per week: Not on file    Minutes per session: Not on file  . Stress: Not on file  Relationships  . Social connections:    Talks on phone: Not on file    Gets together: Not on file    Attends religious service: Not on file    Active member of club or organization: Not on file    Attends meetings of clubs or organizations: Not on file    Relationship status: Not on file  Other Topics Concern  . Not on file  Social History Narrative  . Not on file    Outpatient Encounter Medications as of 05/23/2019  Medication Sig  . amLODipine (NORVASC) 5 MG tablet Take 5 mg by mouth daily.  [name] aspirin EC 81 MG tablet Take 1 tablet (81 mg total) by mouth daily.  [name] atorvastatin (LIPITOR) 40 MG tablet TAKE ONE TABLET BY MOUTH DAILY  . Calcium Carbonate-Vitamin D 600-200 MG-UNIT TABS Take 1 tablet by mouth daily. For bones   . carvedilol (COREG) 25 MG tablet Take 25 mg by mouth 2 (two) times daily with a meal.  . Flaxseed, Linseed, (FLAXSEED OIL PO) Take 1 tablet by mouth daily.   . furosemide (LASIX) 40 MG tablet TAKE TWO TABLETS BY MOUTH IN THE MORNING AND ONE EVERY EVENING  . glipiZIDE (GLUCOTROL) 5 MG tablet TAKE TWO TABLETS BY MOUTH IN THE MORNING AND TAKE ONE TABLET BY MOUTH IN THE EVENING TO CONTROL BLOOD SUGAR  . Insulin Glargine, 1 Unit Dial, (TOUJEO SOLOSTAR) 300 UNIT/ML SOPN Inject 60 Units into the skin daily.  [name] lisinopril (PRINIVIL,ZESTRIL) 40 MG tablet Take 40 mg by mouth 2 (two) times daily.  . metFORMIN (GLUCOPHAGE) 1000 MG tablet TAKE ONE  TABLET BY MOUTH TWICE DAILY with a meal  . Multiple Vitamin (MULTIVITAMINS PO) Take 1 tablet by mouth daily.    . potassium chloride SA (K-DUR) 20 MEQ tablet TAKE ONE TABLET BY MOUTH ONE TIME DAILY   . warfarin (COUMADIN) 5 MG tablet Take 1.5 tablets daily (7.57m) or as directed by [facility].   No facility-administered encounter medications on  file as of 05/23/2019.     Activities of Daily Living No flowsheet data found.  Patient Care Team: Lauree Chandler, NP as PCP - General (Geriatric Medicine) [name], MD as PCP - Cardiology (Cardiology) [name], MD as Attending Physician (Urology) [name], MD as Consulting Physician (Nephrology) [name], OD (Optometry)   Assessment:   This is a routine wellness examination for Theo.  Exercise Activities and Dietary recommendations    Goals    . Weight (lb) < 240 lb (108.9 kg)     Starting 12/16/16, I will attempt to decrease my weight to 240lbs.        Fall Risk Fall Risk  05/23/2019 05/09/2019 02/08/2019 11/08/2018 02/06/2018  Falls in the past year? 0 0 0 1 No  Comment - - - - -  Number falls in past yr: 0 0 0 0 -  Injury with Fall? 0 0 0 0 -  Risk for fall due to : - - - Other (Comment) -  Risk for fall due to: Comment - - - pt slipped in bathtub -   Is the patient's home free of loose throw rugs in walkways, pet beds, electrical cords, etc?   no      Grab bars in the bathroom? yes      Handrails on the stairs?   yes      Adequate lighting?   yes  Timed Get Up and Go Performed: na  Depression Screen PHQ 2/9 Scores 05/23/2019 05/09/2019 02/08/2019 02/06/2018  PHQ - 2 Score 0 0 0 0    Cognitive Function MMSE - Mini Mental State Exam 05/23/2019 12/16/2016  Orientation to time 5 5  Orientation to Place 5 5  Registration 3 3  Attention/ Calculation 5 5  Recall 3 1  Language- name 2 objects 2 2  Language- repeat 1 1  Language- follow 3 step command 3 3  Language- read & follow  direction 1 1  Write a sentence 1 1  Copy design 1 1  Total score 30 28        Immunization History  Administered Date(s) Administered  . Influenza, High Dose Seasonal PF 11/08/2018  . Influenza,inj,Quad PF,6+ Mos 08/27/2013, 09/30/2014, 10/02/2015, 09/16/2016, 10/27/2017  . Influenza-Unspecified 09/25/2012  . Pneumococcal Conjugate-13 06/10/2016  . Pneumococcal Polysaccharide-23 12/09/2010  . Td 12/26/2010    Qualifies for Shingles Vaccine? no  Screening Tests Health Maintenance  Topic Date Due  . INFLUENZA VACCINE  07/27/2019  . FOOT EXAM  08/08/2019  . HEMOGLOBIN A1C  11/07/2019  . OPHTHALMOLOGY EXAM  05/07/2020  . TETANUS/TDAP  12/26/2020  . PNA vac Low Risk Adult  Completed   Cancer Screenings: Lung: Low Dose CT Chest recommended if Age 69-80 years, 30 pack-year currently smoking OR have quit w/in 15years. Patient does not qualify. Colorectal: over due.   Additional Screenings:  Hepatitis C Screening: declined.       Plan:     I have personally reviewed and noted the following in the patient's chart:   . Medical and social history . Use of alcohol, tobacco or illicit drugs  . Current medications and supplements . Functional ability and status . Nutritional status . Physical activity . Advanced directives . List of other physicians . Hospitalizations, surgeries, and ER visits in previous 12 months . Vitals . Screenings to include cognitive, depression, and falls . Referrals and appointments  In addition, I have reviewed and discussed with patient certain preventive protocols, quality metrics, and best  practice recommendations. A written personalized care plan for preventive services as well as general preventive health recommendations were provided to patient.     Lauree Chandler, NP  05/23/2019

## 2019-05-23 NOTE — Patient Instructions (Signed)
Seth [name] , Thank you for taking time to come for your Medicare Wellness Visit. I appreciate your ongoing commitment to your health goals. Please review the following plan we discussed and let me know if I can assist you in the future.   Screening recommendations/referrals: Colonoscopy OVER DUE, cologuard order Recommended yearly ophthalmology/optometry visit for glaucoma screening and checkup Recommended yearly dental visit for hygiene and checkup  Vaccinations: Influenza vaccine  Due 07/2019 Pneumococcal vaccine up to date Tdap vaccine up to date Shingles vaccine declined.     Advanced directives: PLEASE BRING A COPY INTO OFFICE   Conditions/risks identified: fall risk  Next appointment: 1 year  Preventive Care 43 Years and Older, Male Preventive care refers to lifestyle choices and visits with your health care provider that can promote health and wellness. What does preventive care include?  A yearly physical exam. This is also called an annual well check.  Dental exams once or twice a year.  Routine eye exams. Ask your health care provider how often you should have your eyes checked.  Personal lifestyle choices, including:  Daily care of your teeth and gums.  Regular physical activity.  Eating a healthy diet.  Avoiding tobacco and drug use.  Limiting alcohol use.  Practicing safe sex.  Taking low doses of aspirin every day.  Taking vitamin and mineral supplements as recommended by your health care provider. What happens during an annual well check? The services and screenings done by your health care provider during your annual well check will depend on your age, overall health, lifestyle risk factors, and family history of disease. Counseling  Your health care provider may ask you questions about your:  Alcohol use.  Tobacco use.  Drug use.  Emotional well-being.  Home and relationship well-being.  Sexual activity.  Eating habits.  History of  falls.  Memory and ability to understand (cognition).  Work and work Statistician. Screening  You may have the following tests or measurements:  Height, weight, and BMI.  Blood pressure.  Lipid and cholesterol levels. These may be checked every 5 years, or more frequently if you are over 66 years old.  Skin check.  Lung cancer screening. You may have this screening every year starting at age 90 if you have a 30-pack-year history of smoking and currently smoke or have quit within the past 15 years.  Fecal occult blood test (FOBT) of the stool. You may have this test every year starting at age 14.  Flexible sigmoidoscopy or colonoscopy. You may have a sigmoidoscopy every 5 years or a colonoscopy every 10 years starting at age 37.  Prostate cancer screening. Recommendations will vary depending on your family history and other risks.  Hepatitis C blood test.  Hepatitis B blood test.  Sexually transmitted disease (STD) testing.  Diabetes screening. This is done by checking your blood sugar (glucose) after you have not eaten for a while (fasting). You may have this done every 1-3 years.  Abdominal aortic aneurysm (AAA) screening. You may need this if you are a current or former smoker.  Osteoporosis. You may be screened starting at age 63 if you are at high risk. Talk with your health care provider about your test results, treatment options, and if necessary, the need for more tests. Vaccines  Your health care provider may recommend certain vaccines, such as:  Influenza vaccine. This is recommended every year.  Tetanus, diphtheria, and acellular pertussis (Tdap, Td) vaccine. You may need a Td booster every 10 years.  Zoster vaccine. You may need this after age 72.  Pneumococcal 13-valent conjugate (PCV13) vaccine. One dose is recommended after age 32.  Pneumococcal polysaccharide (PPSV23) vaccine. One dose is recommended after age 35. Talk to your health care provider about  which screenings and vaccines you need and how often you need them. This information is not intended to replace advice given to you by your health care provider. Make sure you discuss any questions you have with your health care provider. Document Released: 01/08/2016 Document Revised: 08/31/2016 Document Reviewed: 10/13/2015 Elsevier Interactive Patient Education  2017 [employer] Prevention in the Home Falls can cause injuries. They can happen to people of all ages. There are many things you can do to make your home safe and to help prevent falls. What can I do on the outside of my home?  Regularly fix the edges of walkways and driveways and fix any cracks.  Remove anything that might make you trip as you walk through a door, such as a raised step or threshold.  Trim any bushes or trees on the path to your home.  Use bright outdoor lighting.  Clear any walking paths of anything that might make someone trip, such as rocks or tools.  Regularly check to see if handrails are loose or broken. Make sure that both sides of any steps have handrails.  Any raised decks and porches should have guardrails on the edges.  Have any leaves, snow, or ice cleared regularly.  Use sand or salt on walking paths during winter.  Clean up any spills in your garage right away. This includes oil or grease spills. What can I do in the bathroom?  Use night lights.  Install grab bars by the toilet and in the tub and shower. Do not use towel bars as grab bars.  Use non-skid mats or decals in the tub or shower.  If you need to sit down in the shower, use a plastic, non-slip stool.  Keep the floor dry. Clean up any water that spills on the floor as soon as it happens.  Remove soap buildup in the tub or shower regularly.  Attach bath mats securely with double-sided non-slip rug tape.  Do not have throw rugs and other things on the floor that can make you trip. What can I do in the bedroom?   Use night lights.  Make sure that you have a light by your bed that is easy to reach.  Do not use any sheets or blankets that are too big for your bed. They should not hang down onto the floor.  Have a firm chair that has side arms. You can use this for support while you get dressed.  Do not have throw rugs and other things on the floor that can make you trip. What can I do in the kitchen?  Clean up any spills right away.  Avoid walking on wet floors.  Keep items that you use a lot in easy-to-reach places.  If you need to reach something above you, use a strong step stool that has a grab bar.  Keep electrical cords out of the way.  Do not use floor polish or wax that makes floors slippery. If you must use wax, use non-skid floor wax.  Do not have throw rugs and other things on the floor that can make you trip. What can I do with my stairs?  Do not leave any items on the stairs.  Make sure that there are  handrails on both sides of the stairs and use them. Fix handrails that are broken or loose. Make sure that handrails are as long as the stairways.  Check any carpeting to make sure that it is firmly attached to the stairs. Fix any carpet that is loose or worn.  Avoid having throw rugs at the top or bottom of the stairs. If you do have throw rugs, attach them to the floor with carpet tape.  Make sure that you have a light switch at the top of the stairs and the bottom of the stairs. If you do not have them, ask someone to add them for you. What else can I do to help prevent falls?  Wear shoes that:  Do not have high heels.  Have rubber bottoms.  Are comfortable and fit you well.  Are closed at the toe. Do not wear sandals.  If you use a stepladder:  Make sure that it is fully opened. Do not climb a closed stepladder.  Make sure that both sides of the stepladder are locked into place.  Ask someone to hold it for you, if possible.  Clearly mark and make sure that  you can see:  Any grab bars or handrails.  First and last steps.  Where the edge of each step is.  Use tools that help you move around (mobility aids) if they are needed. These include:  Canes.  Walkers.  Scooters.  Crutches.  Turn on the lights when you go into a dark area. Replace any light bulbs as soon as they burn out.  Set up your furniture so you have a clear path. Avoid moving your furniture around.  If any of your floors are uneven, fix them.  If there are any pets around you, be aware of where they are.  Review your medicines with your doctor. Some medicines can make you feel dizzy. This can increase your chance of falling. Ask your doctor what other things that you can do to help prevent falls. This information is not intended to replace advice given to you by your health care provider. Make sure you discuss any questions you have with your health care provider. Document Released: 10/08/2009 Document Revised: 05/19/2016 Document Reviewed: 01/16/2015 Elsevier Interactive Patient Education  2017 [employer].

## 2019-06-06 ENCOUNTER — Ambulatory Visit (INDEPENDENT_AMBULATORY_CARE_PROVIDER_SITE_OTHER): Payer: Medicare Other | Admitting: Nurse Practitioner

## 2019-06-06 ENCOUNTER — Encounter: Payer: Self-pay | Admitting: Nurse Practitioner

## 2019-06-06 ENCOUNTER — Other Ambulatory Visit: Payer: Self-pay

## 2019-06-06 DIAGNOSIS — Z794 Long term (current) use of insulin: Secondary | ICD-10-CM | POA: Diagnosis not present

## 2019-06-06 DIAGNOSIS — E1143 Type 2 diabetes mellitus with diabetic autonomic (poly)neuropathy: Secondary | ICD-10-CM | POA: Diagnosis not present

## 2019-06-06 DIAGNOSIS — Z6836 Body mass index (BMI) 36.0-36.9, adult: Secondary | ICD-10-CM

## 2019-06-06 MED ORDER — TOUJEO SOLOSTAR 300 UNIT/ML ~~LOC~~ SOPN: 70.0000 [IU] | PEN_INJECTOR | Freq: Every day | SUBCUTANEOUS | 1 refills | Status: DC

## 2019-06-06 NOTE — Progress Notes (Signed)
This service is provided via telemedicine  No vital signs collected/recorded due to the encounter was a telemedicine visit.   Location of patient (ex: home, work):  Home  Patient consents to a telephone visit:  Yes  Location of the provider (ex: office, home):  [facility], Office   Name of any referring provider:  N/A  Names of all persons participating in the telemedicine service and their role in the encounter:  S.Chrae B/CMA, Sherrie Mustache, NP, and Patient   Time spent on call:  4 min with medical assistant      Careteam: Patient Care Team: Lauree Chandler, NP as PCP - General (Geriatric Medicine) [name], MD as PCP - Cardiology (Cardiology) [name], MD as Attending Physician (Urology) [name], MD as Consulting Physician (Nephrology) [name], OD (Optometry)  Advanced Directive information    No Known Allergies  Chief Complaint  Patient presents with   Follow-up    2 week follow-up on blood sugar. Telephone visit      HPI: Patient is a 78 y.o. male for diabetes follow up.  Reports he does not have pharmacy coverage on his medicare so he pays out of pocket for all his medication.  Currently taking toujeo 65 units daily, metformin 1000 mg twice daily and glipizide 10 mg in the morning and 5 mg in the evening- could not afford jardiance  Blood sugars- 202, 183, 208, 222 prior to eating breakfast.only takes blood sugar in the morning  No low sugars.  Does treadmill at home. 30 mins 5 days a week.  States he has adjusted diet some. Continues to work on this.      Review of Systems:  Review of Systems  Constitutional: Negative for chills, diaphoresis and fever.  Eyes: Negative for blurred vision, photophobia and pain.  Respiratory: Negative for cough, shortness of breath and wheezing.   Cardiovascular: Negative for chest pain, palpitations, claudication and leg swelling.  Gastrointestinal: Negative for  abdominal pain, blood in stool, constipation, diarrhea, heartburn, nausea and vomiting.  Musculoskeletal: Negative for back pain and myalgias.  Skin: Negative for rash.  Neurological: Negative for dizziness, tingling, sensory change, speech change and headaches.    Past Medical History:  Diagnosis Date   Atrial fibrillation ([place])    a. coumadin;  b. Amiodarone   CAD (coronary artery disease)    a.  Lexiscan Myoview (02/14/14):  Apical, inferolateral scar; ? Mild ischemia; EF 32%.;  b.  LHC (02/21/14):  dLM 100%, LAD 100%, CFX 100%, dRCA 50%. => CABG (L-LAD, S-OM, S-PDA)   Chronic systolic heart failure (HCC)    CKD (chronic kidney disease)    proteinuria   Hemorrhage of gastrointestinal tract, unspecified    Hemorrhage of rectum and anus    HLD (hyperlipidemia)    HTN (hypertension)    Impotence of organic origin    Ischemic cardiomyopathy    a. Echo (02/17/14):  EF 35-40%, anteroseptal and apical AK, Gr 2 DD, MAC, mod LAE.   Nephrolithiasis    Peripheral vascular disease, unspecified ([place])    Prostate cancer ([place])    Thrombocytopenia, unspecified ([place])    Tobacco use disorder    Type II or unspecified type diabetes mellitus with renal manifestations, uncontrolled(250.42)    Past Surgical History:  Procedure Laterality Date   BACK SURGERY     CATARACT EXTRACTION  2010   CLIPPING OF ATRIAL APPENDAGE Left 02/24/2014   Procedure: CLIPPING OF ATRIAL APPENDAGE;  Surgeon: Gaye Pollack,  MD;  Location: [facility] OR;  Service: Open Heart Surgery;  Laterality: Left;   COLONOSCOPY  2005   Normal   CORONARY ARTERY BYPASS GRAFT N/A 02/24/2014   Procedure: Coronary artery bypass graft times three using left internal mammary artery and left leg greater saphenous vein harvested endoscopically.;  Surgeon: Gaye Pollack, MD;  Location: [facility] OR;  Service: Open Heart Surgery;  Laterality: N/A;   INTRAOPERATIVE TRANSESOPHAGEAL ECHOCARDIOGRAM N/A 02/24/2014   Procedure: INTRAOPERATIVE  TRANSESOPHAGEAL ECHOCARDIOGRAM;  Surgeon: Gaye Pollack, MD;  Location: [facility] OR;  Service: Open Heart Surgery;  Laterality: N/A;   LEFT HEART CATHETERIZATION WITH CORONARY ANGIOGRAM N/A 02/21/2014   Procedure: LEFT HEART CATHETERIZATION WITH CORONARY ANGIOGRAM;  Surgeon: Peter M Martinique, MD;  Location: [facility] CATH LAB;  Service: Cardiovascular;  Laterality: N/A;   Social History:   reports that he has never smoked. He quit smokeless tobacco use about 5 years ago.  His smokeless tobacco use included chew. He reports that he does not drink alcohol or use drugs.  Family History  Problem Relation Age of Onset   Cancer Mother 83       leukemia   Heart disease Father    Heart attack Father 88   Lung cancer Brother     Medications: Patient's Medications  New Prescriptions   No medications on file  Previous Medications   AMLODIPINE (NORVASC) 5 MG TABLET    Take 5 mg by mouth daily.   ASPIRIN EC 81 MG TABLET    Take 1 tablet (81 mg total) by mouth daily.   ATORVASTATIN (LIPITOR) 40 MG TABLET    TAKE ONE TABLET BY MOUTH DAILY   CALCIUM CARBONATE-VITAMIN D 600-200 MG-UNIT TABS    Take 1 tablet by mouth daily. For bones    CARVEDILOL (COREG) 25 MG TABLET    Take 25 mg by mouth 2 (two) times daily with a meal.   EMPAGLIFLOZIN (JARDIANCE) 10 MG TABS TABLET    Take 10 mg by mouth daily.   FLAXSEED, LINSEED, (FLAXSEED OIL PO)    Take 1 tablet by mouth daily.    FUROSEMIDE (LASIX) 40 MG TABLET    TAKE TWO TABLETS BY MOUTH IN THE MORNING AND ONE EVERY EVENING   GLIPIZIDE (GLUCOTROL) 5 MG TABLET    TAKE TWO TABLETS BY MOUTH IN THE MORNING AND TAKE ONE TABLET BY MOUTH IN THE EVENING TO CONTROL BLOOD SUGAR   INSULIN GLARGINE, 1 UNIT DIAL, (TOUJEO SOLOSTAR) 300 UNIT/ML SOPN    Inject 65 Units into the skin daily.   LISINOPRIL (PRINIVIL,ZESTRIL) 40 MG TABLET    Take 40 mg by mouth 2 (two) times daily.   METFORMIN (GLUCOPHAGE) 1000 MG TABLET    TAKE ONE TABLET BY MOUTH TWICE DAILY with a meal   MULTIPLE  VITAMIN (MULTIVITAMINS PO)    Take 1 tablet by mouth daily.     POTASSIUM CHLORIDE SA (K-DUR) 20 MEQ TABLET    TAKE ONE TABLET BY MOUTH ONE TIME DAILY    WARFARIN (COUMADIN) 5 MG TABLET    Take 1.5 tablets daily (7.77m) or as directed by Coumadin Clinic.  Modified Medications   No medications on file  Discontinued Medications   No medications on file    Physical Exam:  There were no vitals filed for this visit. There is no height or weight on file to calculate BMI. Wt Readings from Last 3 Encounters:  05/23/19 249 lb (112.9 kg)  05/23/19 249 lb (112.9 kg)  02/08/19 255 lb (115.7  kg)     Labs reviewed: Basic Metabolic Panel: Recent Labs    11/02/18 0902 02/08/19 1055 05/07/19 0900  NA 140 141 140  K 3.9 4.2 4.0  CL 102 103 105  CO2 _0 GLUCOSE 276* 347* 253*  BUN _1 CREATININE 1.00 0.97 1.01  CALCIUM 9.0 9.3 9.1   Liver Function Tests: Recent Labs    08/03/18 0838 11/02/18 0902 02/08/19 1055  AST  --  14 13  ALT _2 BILITOT  --  0.7 0.5  PROT  --  6.3 6.5   No results for input(s): LIPASE, AMYLASE in the last 8760 hours. No results for input(s): AMMONIA in the last 8760 hours. CBC: Recent Labs    02/08/19 1055  WBC 7.1  NEUTROABS 4,359  HGB 14.2  HCT 41.7  MCV 83.2  PLT 145   Lipid Panel: Recent Labs    08/03/18 0838 11/02/18 0902  CHOL 103 98  HDL 36* 34*  LDLCALC 41 39  TRIG 187* 167*  CHOLHDL 2.9 2.9   TSH: No results for input(s): TSH in the last 8760 hours. A1C: Lab Results  Component Value Date   HGBA1C 9.8 (H) 05/07/2019     Assessment/Plan 1. Type 2 diabetes mellitus with diabetic autonomic neuropathy, with long-term current use of insulin ([place]) -unable to afford medication due to not having medicare part d, he has assistance getting his toujeo covered. Will increase toujeo to 70 units at this time as fasting blood sugars remain elevated in the 200s.  -encouraged to continue to work on diet modifications and  exercise, routine foot care/monitoring and to keep up with diabetic eye exams through ophthalmology  - Insulin Glargine, 1 Unit Dial, (TOUJEO SOLOSTAR) 300 UNIT/ML SOPN; Inject 70 Units into the skin daily.  Dispense: 15 mL; Refill: 1 - AMB Referral to [place] Management to help with medication coverage.   2. Class 2 severe obesity due to excess calories with serious comorbidity and body mass index (BMI) of 36.0 to 36.9 in adult [facility]) encouraged weight loss with increase in activity/exercise and diet modifications.   Next appt: 08/12/2019 Carlos American. Harle Battiest  [facility] [phone]    Virtual Visit via Telephone Note  I connected with pt on 06/06/19 at  2:15 PM EDT by telephone and verified that I am speaking with the correct person using two identifiers.  Location: Patient: home Provider: office   I discussed the limitations, risks, security and privacy concerns of performing an evaluation and management service by telephone and the availability of in person appointments. I also discussed with the patient that there may be a patient responsible charge related to this service. The patient expressed understanding and agreed to proceed.   I discussed the assessment and treatment plan with the patient. The patient was provided an opportunity to ask questions and all were answered. The patient agreed with the plan and demonstrated an understanding of the instructions.   The patient was advised to call back or seek an in-person evaluation if the symptoms worsen or if the condition fails to improve as anticipated.  I provided 16 minutes of non-face-to-face time during this encounter.  Carlos American. Harle Battiest Avs printed and mailed

## 2019-06-06 NOTE — Patient Instructions (Signed)
To increase toujeo to 70 units.   Will place [facility] referral to see if they can help you afford medications that would better control diabetes.   Continue to work on diet and increasing activity/exercise as you can    DASH Eating Plan DASH stands for "Dietary Approaches to Stop Hypertension." The DASH eating plan is a healthy eating plan that has been shown to reduce high blood pressure (hypertension). It may also reduce your risk for type 2 diabetes, heart disease, and stroke. The DASH eating plan may also help with weight loss. What are tips for following this plan?  General guidelines  Avoid eating more than 2,300 mg (milligrams) of salt (sodium) a day. If you have hypertension, you may need to reduce your sodium intake to 1,500 mg a day.  Limit alcohol intake to no more than 1 drink a day for nonpregnant women and 2 drinks a day for men. One drink equals 12 oz of beer, 5 oz of wine, or 1 oz of hard liquor.  Work with your health care provider to maintain a healthy body weight or to lose weight. Ask what an ideal weight is for you.  Get at least 30 minutes of exercise that causes your heart to beat faster (aerobic exercise) most days of the week. Activities may include walking, swimming, or biking.  Work with your health care provider or diet and nutrition specialist (dietitian) to adjust your eating plan to your individual calorie needs. Reading food labels   Check food labels for the amount of sodium per serving. Choose foods with less than 5 percent of the Daily Value of sodium. Generally, foods with less than 300 mg of sodium per serving fit into this eating plan.  To find whole grains, look for the word "whole" as the first word in the ingredient list. Shopping  Buy products labeled as "low-sodium" or "no salt added."  Buy fresh foods. Avoid canned foods and premade or frozen meals. Cooking  Avoid adding salt when cooking. Use salt-free seasonings or herbs instead of table  salt or sea salt. Check with your health care provider or pharmacist before using salt substitutes.  Do not fry foods. Cook foods using healthy methods such as baking, boiling, grilling, and broiling instead.  Cook with heart-healthy oils, such as olive, canola, soybean, or sunflower oil. Meal planning  Eat a balanced diet that includes: ? 5 or more servings of fruits and vegetables each day. At each meal, try to fill half of your plate with fruits and vegetables. ? Up to 6-8 servings of whole grains each day. ? Less than 6 oz of lean meat, poultry, or fish each day. A 3-oz serving of meat is about the same size as a deck of cards. One egg equals 1 oz. ? 2 servings of low-fat dairy each day. ? A serving of nuts, seeds, or beans 5 times each week. ? Heart-healthy fats. Healthy fats called Omega-3 fatty acids are found in foods such as flaxseeds and coldwater fish, like sardines, salmon, and mackerel.  Limit how much you eat of the following: ? Canned or prepackaged foods. ? Food that is high in trans fat, such as fried foods. ? Food that is high in saturated fat, such as fatty meat. ? Sweets, desserts, sugary drinks, and other foods with added sugar. ? Full-fat dairy products.  Do not salt foods before eating.  Try to eat at least 2 vegetarian meals each week.  Eat more home-cooked food and less restaurant,  buffet, and fast food.  When eating at a restaurant, ask that your food be prepared with less salt or no salt, if possible. What foods are recommended? The items listed may not be a complete list. Talk with your dietitian about what dietary choices are best for you. Grains Whole-grain or whole-wheat bread. Whole-grain or whole-wheat pasta. Brown rice. Modena Morrow. Bulgur. Whole-grain and low-sodium cereals. Pita bread. Low-fat, low-sodium crackers. Whole-wheat flour tortillas. Vegetables Fresh or frozen vegetables (raw, steamed, roasted, or grilled). Low-sodium or  reduced-sodium tomato and vegetable juice. Low-sodium or reduced-sodium tomato sauce and tomato paste. Low-sodium or reduced-sodium canned vegetables. Fruits All fresh, dried, or frozen fruit. Canned fruit in natural juice (without added sugar). Meat and other protein foods Skinless chicken or [place]. Ground chicken or [place]. Pork with fat trimmed off. Fish and seafood. Egg whites. Dried beans, peas, or lentils. Unsalted nuts, nut butters, and seeds. Unsalted canned beans. Lean cuts of beef with fat trimmed off. Low-sodium, lean deli meat. Dairy Low-fat (1%) or fat-free (skim) milk. Fat-free, low-fat, or reduced-fat cheeses. Nonfat, low-sodium ricotta or cottage cheese. Low-fat or nonfat yogurt. Low-fat, low-sodium cheese. Fats and oils Soft margarine without trans fats. Vegetable oil. Low-fat, reduced-fat, or light mayonnaise and salad dressings (reduced-sodium). Canola, safflower, olive, soybean, and sunflower oils. Avocado. Seasoning and other foods Herbs. Spices. Seasoning mixes without salt. Unsalted popcorn and pretzels. Fat-free sweets. What foods are not recommended? The items listed may not be a complete list. Talk with your dietitian about what dietary choices are best for you. Grains Baked goods made with fat, such as croissants, muffins, or some breads. Dry pasta or rice meal packs. Vegetables Creamed or fried vegetables. Vegetables in a cheese sauce. Regular canned vegetables (not low-sodium or reduced-sodium). Regular canned tomato sauce and paste (not low-sodium or reduced-sodium). Regular tomato and vegetable juice (not low-sodium or reduced-sodium). Angie Fava. Olives. Fruits Canned fruit in a light or heavy syrup. Fried fruit. Fruit in cream or butter sauce. Meat and other protein foods Fatty cuts of meat. Ribs. Fried meat. Berniece Salines. Sausage. Bologna and other processed lunch meats. Salami. Fatback. Hotdogs. Bratwurst. Salted nuts and seeds. Canned beans with added salt. Canned or  smoked fish. Whole eggs or egg yolks. Chicken or [place] with skin. Dairy Whole or 2% milk, cream, and half-and-half. Whole or full-fat cream cheese. Whole-fat or sweetened yogurt. Full-fat cheese. Nondairy creamers. Whipped toppings. Processed cheese and cheese spreads. Fats and oils Butter. Stick margarine. Lard. Shortening. Ghee. Bacon fat. Tropical oils, such as coconut, palm kernel, or palm oil. Seasoning and other foods Salted popcorn and pretzels. Onion salt, garlic salt, seasoned salt, table salt, and sea salt. Worcestershire sauce. Tartar sauce. Barbecue sauce. Teriyaki sauce. Soy sauce, including reduced-sodium. Steak sauce. Canned and packaged gravies. Fish sauce. Oyster sauce. Cocktail sauce. Horseradish that you find on the shelf. Ketchup. Mustard. Meat flavorings and tenderizers. Bouillon cubes. Hot sauce and Tabasco sauce. Premade or packaged marinades. Premade or packaged taco seasonings. Relishes. Regular salad dressings. Where to find more information:  National Heart, Lung, and [place]: https://wilson-eaton.com/  American Heart Association: www.heart.org Summary  The DASH eating plan is a healthy eating plan that has been shown to reduce high blood pressure (hypertension). It may also reduce your risk for type 2 diabetes, heart disease, and stroke.  With the DASH eating plan, you should limit salt (sodium) intake to 2,300 mg a day. If you have hypertension, you may need to reduce your sodium intake to 1,500 mg a day.  When  on the DASH eating plan, aim to eat more fresh fruits and vegetables, whole grains, lean proteins, low-fat dairy, and heart-healthy fats.  Work with your health care provider or diet and nutrition specialist (dietitian) to adjust your eating plan to your individual calorie needs. This information is not intended to replace advice given to you by your health care provider. Make sure you discuss any questions you have with your health care provider. Document  Released: 12/01/2011 Document Revised: 12/05/2016 Document Reviewed: 12/05/2016 Elsevier Interactive Patient Education  2019 [employer].

## 2019-06-11 ENCOUNTER — Other Ambulatory Visit: Payer: Self-pay | Admitting: Pharmacy Technician

## 2019-06-11 ENCOUNTER — Other Ambulatory Visit: Payer: Self-pay

## 2019-06-11 ENCOUNTER — Telehealth: Payer: Self-pay | Admitting: Pharmacist

## 2019-06-11 NOTE — Patient Outreach (Signed)
[facility] [facility]) Care Management  [place]   06/11/2019  LOI RENNAKER 26-Feb-1941 [id number]  Reason for referral: medication assistance  Referral source: [facility] Telephonic Nurse Referral medication(s): Jardiance Current insurance  HPI:  Afib, cardiomyopathy, CKD, coronary atherosclerosis, type 2 diabetes with secondary peripheral neuropathy, hypertension, hyperlipidemia, and history of prostate cancer.  Patient was called regarding medication assistance. HIPAA identifiers were obtained. Patient said he had Jardiance filled and the pharmacy told him the copay was going to be more than $1000.  Objective: No Known Allergies  Medications Reviewed Today    Reviewed by Elayne Guerin, [facility] (Pharmacist) on 06/11/19 at 1422  Med List Status: <None>  Medication Order Taking? Sig Documenting Provider Last Dose Status Informant  amLODipine (NORVASC) 5 MG tablet [id number] Yes Take 5 mg by mouth daily. [provider] Taking Active   aspirin EC 81 MG tablet [id number] Yes Take 1 tablet (81 mg total) by mouth daily. [name] T, PA-C Taking Active   atorvastatin (LIPITOR) 40 MG tablet [id number] Yes TAKE ONE TABLET BY MOUTH DAILY Lauree Chandler, NP Taking Active   Calcium Carbonate-Vitamin D 600-200 MG-UNIT TABS [id number] Yes Take 1 tablet by mouth daily. For bones  [provider] Taking Active Self  carvedilol (COREG) 25 MG tablet [id number] Yes Take 25 mg by mouth 2 (two) times daily with a meal. [provider] Taking Active   Flaxseed, Linseed, (FLAXSEED OIL PO) [id number] Yes Take 1 tablet by mouth daily.  [provider] Taking Active Self  furosemide (LASIX) 40 MG tablet [id number] Yes TAKE TWO TABLETS BY MOUTH IN THE MORNING AND ONE EVERY EVENING [name], MD Taking Active   glipiZIDE (GLUCOTROL) 5 MG tablet [id number] Yes TAKE TWO TABLETS BY MOUTH IN THE MORNING AND TAKE ONE TABLET BY MOUTH IN THE EVENING TO CONTROL BLOOD  SUGAR Lauree Chandler, NP Taking Active   Insulin Glargine, 1 Unit Dial, (TOUJEO SOLOSTAR) 300 UNIT/ML SOPN [id number] Yes Inject 70 Units into the skin daily. Lauree Chandler, NP Taking Active   lisinopril (PRINIVIL,ZESTRIL) 40 MG tablet [id number] Yes Take 40 mg by mouth 2 (two) times daily. [provider] Taking Active   metFORMIN (GLUCOPHAGE) 1000 MG tablet [id number] Yes TAKE ONE TABLET BY MOUTH TWICE DAILY with a meal Lauree Chandler, NP Taking Active   Multiple Vitamin (MULTIVITAMINS PO) [id number] Yes Take 1 tablet by mouth daily.   [provider] Taking Active Self  potassium chloride SA (K-DUR) 20 MEQ tablet [id number] Yes TAKE ONE TABLET BY MOUTH ONE TIME DAILY  Lauree Chandler, NP Taking Active   warfarin (COUMADIN) 5 MG tablet [id number] Yes Take 1.5 tablets daily (7.5mg ) or as directed by Coumadin Clinic. [name], MD Taking Active           Assessment:  Drugs sorted by system:  Cardiovascular: Amlodipine, Aspirin, Atorvastatin, Carvedilol, Furosemide, Lisinopril, Warfarin  Endocrine: Glipizide, Toujeo, Metformin, (Jardiance)  Vitamins/Minerals/Supplements: Calcium Carbonate/Vitamin D, Flaxseed, Multiple Vitamin, Potassium Chloride,    Medication Review Findings:  . HgA1c-9.8% -Jardiance added but patient could not afford . INR-05/17/2019-2.8   Medication Assistance Findings:  Medication assistance needs identified: Clemetine Marker   Patient does not have a Medicare Part D plan and he is over income so he does NOT qualify for the Extra Help or LIS program.  He MAY qualify to receive Toujeo from [employer] patient assistance program and Jardiance from [employer] program.  Patient communicated understanding about the necessary financial  documentation.  Additional medication assistance options reviewed with patient as warranted:  No other options identified  Plan: I will route patient assistance letter to [place] technician who will coordinate patient assistance program application process for medications listed above.  [facility] pharmacy technician will assist with obtaining all required documents from both patient and provider(s) and submit application(s) once completed.    Follow up with the patient in 4-6 weeks.  Elayne Guerin, PharmD, [place] Clinical Pharmacist [phone]

## 2019-06-11 NOTE — Patient Outreach (Signed)
[facility]) Care Management  06/11/2019  Seth [name] [birth year] [id number]   Referral Date: 06/07/2019 Referral Source: MD referral Referral Reason: No Medicare Part D coverage, patient cannot afford medications   Outreach Attempt: spoke with patient. He is able to verify HIPAA.  He states that he does not have Part D coverage and the doctor wants to put him on Jardiance but the medication is about $1000 and he cannot afford that.     Social: Patient lives in the home with spouse,  Patient is independent with all ADL's and still drives.    Conditions: Patient admits to DM, HTN, HF, PVD, and Atrial Fibrillation.  Patient states he manages his health well to be 78 years old.  Patient states that his sugars have come way down and last A1c noted to be down to 9.8.  Offered patient disease management but patient declined at this time.     Medications: Patient takes medications as prescribed but does not have Medicare part D and has problems affording medications.     Appointments: Patient saw PCP last week.     Advanced Directives: Patient does have advanced directive.     Consent: Discussed [facility] services and support.  Patient agreeable to pharmacy only at this time.     Plan: RN CM will refer patient to pharmacy for medication assistance.     Jone Baseman, RN, MSN [facility] Care Management Care Management Coordinator Direct Line [phone] Toll Free: [phone]  Fax: [phone]

## 2019-06-11 NOTE — Patient Outreach (Signed)
[facility]) Care Management  06/11/2019  Seth [name] Aug 30, 1941 [id number]                           Medication Assistance Referral  Referral From: [place]  Medication/Company: [name] Patient application portion:  Education officer, museum portion: Faxed  to Sherrie Mustache, NP  Medication/Company: [name] Patient application portion:  Mailed Provider application portion: Faxed  to Sherrie Mustache, NP    Follow up:  Will follow up with patient in 5-10 business days to confirm application(s) have been received.  [name] P. Zaryia Markel, [place] Management [phone]

## 2019-06-12 ENCOUNTER — Telehealth: Payer: Self-pay | Admitting: *Deleted

## 2019-06-12 NOTE — Telephone Encounter (Signed)
Received paperwork for Patient assistance from [place] with [facility] for Toujeo and Jardiance.  Placed in [place] folder to review and sign. To be faxed back to [facility] Fax: [phone] once completed.

## 2019-06-19 ENCOUNTER — Other Ambulatory Visit: Payer: Self-pay | Admitting: Pharmacy Technician

## 2019-06-19 NOTE — Patient Outreach (Signed)
[place] [facility]) Care Management  06/19/2019  Seth [name] September 23, 1941 [id number]    Successful outreach call placed to patient in regards to [place] application for [employer] and BI application for Jardiance.  Spoke to patient, HIPAA identifiers verified.  Patient informed this was not a good time as he was at the dentist office. He inquired if the call could be returned tomorrow.  Will followup with patient in 1-3 business days to inquire if he has received his applications.  [name] P. Hallee Mckenny, [place] Management [phone]

## 2019-06-20 ENCOUNTER — Other Ambulatory Visit: Payer: Self-pay | Admitting: Pharmacy Technician

## 2019-06-20 NOTE — Patient Outreach (Signed)
[place] [facility]) Care Management  06/20/2019  Seth [name] 08-Aug-1941 [id number]    Successful outreach call placed to patient in regards to [place] patient assistance for Toujeo and BI patient assistance for [place].  Spoke to patient, HIPAA identifiers verified.  Patient informed he had received the applications and had placed them in the mail a few days ago.  Will followup with patient in 10-14 business days if application has not been received.  [name] P. Nkechi Linehan, [place] Management [phone]

## 2019-06-27 ENCOUNTER — Other Ambulatory Visit: Payer: Self-pay | Admitting: Pharmacy Technician

## 2019-06-27 NOTE — Patient Outreach (Signed)
[place] [facility]) Care Management  06/27/2019  Seth [name] November 18, 1941 [id number]    Received all necessary documents and signatures from both patient and provider for [employer] patient assistance for Toujeo and BI patient assistance for Jardiance.  Submitted completed application via fax to both companies.  Will followup with both companies within 2-10 business days.  [name] [name], [place] Management [phone]

## 2019-07-02 ENCOUNTER — Other Ambulatory Visit: Payer: Self-pay | Admitting: Pharmacy Technician

## 2019-07-02 NOTE — Patient Outreach (Signed)
[place] [facility]) Care Management  07/02/2019  Seth [name] March 08, 1941 [id number]    Care coordination call placed to [facility] patient assistance in regards to patient's application for Toujeo.  Spoke to [place] who informed they ahd received the application but no determination has been made. He informed to try back in a few days.  Will followup with [employer] in 3-5 business days.  [name] P. [name], [name] [phone]

## 2019-07-04 ENCOUNTER — Telehealth: Payer: Self-pay

## 2019-07-04 NOTE — Telephone Encounter (Signed)

## 2019-07-08 ENCOUNTER — Other Ambulatory Visit: Payer: Self-pay | Admitting: Pharmacy Technician

## 2019-07-08 NOTE — Patient Outreach (Signed)
[place] [facility]) Care Management  07/08/2019  JONAHTAN MANSEAU 1941-12-11 [id number]    Care coordination call placed to [facility] patient assistance in regards to patient's Toujeo application.   Spoke to [place] who informed patient had been approved 07/04/2019-07/02/2020. She informed that [employer] on [place], [place], Wednesday and that this shipment will go out this week. Once it is shipped it will take 3-5 business days to arrive at the provider's office.  Will followup with patient in 5-7 business days to inquire if he has received the medication.  Malikai Gut P. Aspynn Clover, [place] Management [phone]

## 2019-07-08 NOTE — Telephone Encounter (Signed)
Received fax from [employer] patient Assistance Program [phone] and they are UNABLE to Provide patient with medication due to Over Income.    Received fax from [facility] patient Connection [phone] and patient is ELIGIBLE to receive Toujeo through program. Will remain eligible for therapy until 07/02/2020

## 2019-07-08 NOTE — Patient Outreach (Signed)
[facility]) Care Management  07/08/2019  Seth [name] 28-May-1941 [id number]  Care coordination call placed to BI cares in regards to patient's application for Jardiance.  Spoke to [name] who informed patient is denied due to being over income. Patient submitted 1040 as proof of income. She informed they take line 5a-5b and then add that number to line 7b which gave an income of over  71,000. She informed patient could appeal the decision by providing the out of pocket cost for the medication.  Will route note to [place] for assisance in obtaining that information.  [name] P. Verdis Bassette, [place] Management [phone]

## 2019-07-09 ENCOUNTER — Telehealth: Payer: Self-pay

## 2019-07-09 NOTE — Telephone Encounter (Signed)
Tried calling patient to inform him that his Toujeo injections are here at office for pickup. LMOM for patient to return call to office.

## 2019-07-10 ENCOUNTER — Telehealth: Payer: Self-pay

## 2019-07-10 NOTE — Telephone Encounter (Signed)
Patient returned call. Patient will stop by [facility] on Thursday 07/11/2019 to pick up samples

## 2019-07-10 NOTE — Telephone Encounter (Signed)
Tried 2nd attempt to call patient to inform them, their medication is available for pick up at [facility]. No answer. LMOM for patient to call office.   [employer] had sent 4 boxes of Toujeo pens 3x1.81ml a total of 6 pens to [facility] yesterday. Tried to call patient to let them know medication is available yesterday as well.

## 2019-07-11 ENCOUNTER — Other Ambulatory Visit: Payer: Self-pay

## 2019-07-11 ENCOUNTER — Ambulatory Visit (INDEPENDENT_AMBULATORY_CARE_PROVIDER_SITE_OTHER): Payer: Medicare Other | Admitting: *Deleted

## 2019-07-11 DIAGNOSIS — Z5181 Encounter for therapeutic drug level monitoring: Secondary | ICD-10-CM

## 2019-07-11 DIAGNOSIS — I4891 Unspecified atrial fibrillation: Secondary | ICD-10-CM

## 2019-07-11 LAB — POCT INR: INR: 3.3 — AB (ref 2.0–3.0)

## 2019-07-11 NOTE — Patient Instructions (Signed)
Description   Do not take any Coumadin today then continue taking 1.5 tablets daily. Recheck in 6 weeks. Call with any new medications [phone]. Keep greens consistent

## 2019-07-15 ENCOUNTER — Other Ambulatory Visit: Payer: Self-pay | Admitting: Pharmacy Technician

## 2019-07-15 NOTE — Patient Outreach (Signed)
[place] [facility]) Care Management  07/15/2019  Seth [name] [birth year] [id number]   Unsuccessful outreach call placed to patient in regards to [facility] application for Jardiance and [employer] application for [employer].  Unfortunately patient did not answer the phone, HIPAA compliant voicemail left for patient.  Was calling patient to inquire if he has received the [facility] and see if he has received printout from pharmacy for [facility] cost.  Will followup with patient in 3-7 business days.  Seth [name], [place] Management [phone]

## 2019-07-19 ENCOUNTER — Telehealth: Payer: Self-pay | Admitting: Pharmacist

## 2019-07-19 NOTE — Patient Outreach (Signed)
[place] [facility]) Care Management  07/19/2019  KENDYL BISSONNETTE 1941-09-26 [id number]   Called patient regarding medication assistance. Unfortunately, he did not answer the phone. HIPAA compliant message was left on the patient's voicemail.  Received the following in a message from [place] Simcox:   Patient  will have to appeal BI if he wants Jardiance as he iscurrently denied due to over income. The ccst of the med has to be greater than 3% of his income,.We will need an OOP for the Jardiance.   He was approved for [employer] with [employer].   Purpose of today's call was to discuss the findings from [place].  Plan: Call patient back in 10-14 business days.  Elayne Guerin, PharmD, [place] Clinical Pharmacist [phone]

## 2019-07-24 ENCOUNTER — Other Ambulatory Visit: Payer: Self-pay | Admitting: Pharmacy Technician

## 2019-07-24 NOTE — Patient Outreach (Signed)
[place] [facility]) Care Management  07/24/2019  Seth [name] 06/20/1941 [id number]    Unsuccessful outreach call #2 placed to patient in regards to [employer] application for [employer] and BI application for [employer].  Unfortunately patient did not answer the phone, HIPAA compliant voicemail left.  Was calling patient to inquire if he had received the Toujeo and to update him on the BI application and the need to appeal the decision if he wishes to do so.  Will followup with patient in 3-7 business days.  Nandika Stetzer P. Sadi Arave, [place] Management [phone]

## 2019-07-25 ENCOUNTER — Other Ambulatory Visit: Payer: Self-pay | Admitting: Cardiovascular Disease

## 2019-07-29 ENCOUNTER — Ambulatory Visit: Payer: Self-pay | Admitting: Pharmacist

## 2019-08-01 ENCOUNTER — Telehealth: Payer: Self-pay

## 2019-08-01 ENCOUNTER — Other Ambulatory Visit: Payer: Self-pay | Admitting: Pharmacy Technician

## 2019-08-01 NOTE — Telephone Encounter (Signed)
Received fax from [facility] indicating patient had not returned his sample. Call placed to patient to see if he had planned on returning the sample. Left message for patient to return call to office.

## 2019-08-01 NOTE — Patient Outreach (Signed)
[place] [facility]) Care Management  08/01/2019  Seth [name] [birth year] [id number]    ADDENDUM  Incoming call received from patient in regards to [facility] application for Jardiance and [employer] application for [employer].  Spoke to patient, HIPAA identifiers verified.  Patient informed he picked up the [facility] and he believes he received  7 boxes which is a 90 days supply. Discussed refill procedure with patient.  Updated patient on the status of the BI application. Informed patient he would need a printout from his pharmacy that contains his name, dob, name of drug, cost that the patient would have to pay for the medication, the number of tablets and how long that should last him. Once that is received from the patient then I can submit to the company for approval. Per previous conversation with BI representative the cost has to exceed 3% of the total income so the patient would have to spend for the year more than $2147.49 for the Jardiance.  Will followup with patient in 10-14 business days if not received printout.  Cleona Doubleday P. Maleeah Crossman, [place] Management [phone]

## 2019-08-01 NOTE — Patient Outreach (Signed)
[place] [facility]) Care Management  08/01/2019  Seth [name] Apr 22, 1941 [id number]    Outreach call placed to patient who informed this was not a good time for him to talk about his patient assistance applications ([employer] [place] and [employer]).  Patient took down my name and number and informed he would call me back later this afternoon.  Was calling patient to inquire if he had received his Toujoe and to update him on the BI application and the need to appeal the decision if the patient agrees to that process.  Will followup with patient in 2-7 business days if call is not returned.  [name] P. Aycen Porreca, [place] Management [phone]

## 2019-08-02 ENCOUNTER — Other Ambulatory Visit: Payer: Self-pay | Admitting: Cardiovascular Disease

## 2019-08-09 NOTE — Telephone Encounter (Signed)
Call [place] to [place] that he does plan on returning the stool  sample to [facility].

## 2019-08-12 ENCOUNTER — Other Ambulatory Visit: Payer: Medicare Other

## 2019-08-15 ENCOUNTER — Ambulatory Visit: Payer: Medicare Other | Admitting: Nurse Practitioner

## 2019-08-21 ENCOUNTER — Other Ambulatory Visit: Payer: Self-pay | Admitting: Pharmacy Technician

## 2019-08-21 DIAGNOSIS — Z1212 Encounter for screening for malignant neoplasm of rectum: Secondary | ICD-10-CM | POA: Diagnosis not present

## 2019-08-21 DIAGNOSIS — Z1211 Encounter for screening for malignant neoplasm of colon: Secondary | ICD-10-CM | POA: Diagnosis not present

## 2019-08-21 NOTE — Patient Outreach (Signed)
[place] [facility]) Care Management  08/21/2019  Seth [name] June 12, 1941 [id number]   Successful outreach call placed to patient in regards to [facility] application for Jardiance.  Spoke to patient, HIPAA identifiers verified.  Inquired if patient had been able to obtain the printout for the Jardiance from his pharmacy that the patient assistance company is requiring to complete the application.  Patient informed he had not been to the pharmacy. He informed he had a provider's appointment on August 30, 2019. He informed he would have them send a prescription to his pharmacy, [facility]. He informed that he would then go to [facility] and request the printout and then send it to me. Confirmed patient had name and number.  Will followup with patient in 4-6 weeks to inquire if he has been able to obtain the necessary information.  [name] P. Antiono Ettinger, [place] Management [phone]

## 2019-08-22 ENCOUNTER — Ambulatory Visit (INDEPENDENT_AMBULATORY_CARE_PROVIDER_SITE_OTHER): Payer: Medicare Other | Admitting: Pharmacist

## 2019-08-22 ENCOUNTER — Other Ambulatory Visit: Payer: Self-pay

## 2019-08-22 DIAGNOSIS — Z5181 Encounter for therapeutic drug level monitoring: Secondary | ICD-10-CM

## 2019-08-22 DIAGNOSIS — I4891 Unspecified atrial fibrillation: Secondary | ICD-10-CM | POA: Diagnosis not present

## 2019-08-22 LAB — POCT INR: INR: 2.7 (ref 2.0–3.0)

## 2019-08-22 NOTE — Patient Instructions (Signed)
Description   Continue taking 1.5 tablets daily. Recheck in 6 weeks. Call with any new medications [phone]      

## 2019-08-27 ENCOUNTER — Other Ambulatory Visit: Payer: Medicare Other

## 2019-08-27 ENCOUNTER — Other Ambulatory Visit: Payer: Self-pay

## 2019-08-27 ENCOUNTER — Ambulatory Visit: Payer: Self-pay | Admitting: Pharmacist

## 2019-08-27 DIAGNOSIS — E1143 Type 2 diabetes mellitus with diabetic autonomic (poly)neuropathy: Secondary | ICD-10-CM

## 2019-08-27 DIAGNOSIS — Z794 Long term (current) use of insulin: Secondary | ICD-10-CM | POA: Diagnosis not present

## 2019-08-27 LAB — COLOGUARD: Cologuard: POSITIVE — AB

## 2019-08-28 ENCOUNTER — Encounter: Payer: Self-pay | Admitting: *Deleted

## 2019-08-28 LAB — BASIC METABOLIC PANEL WITH GFR
BUN: 17 mg/dL (ref 7–25)
CO2: 31 mmol/L (ref 20–32)
Calcium: 9.1 mg/dL (ref 8.6–10.3)
Chloride: 106 mmol/L (ref 98–110)
Creat: 1.08 mg/dL (ref 0.70–1.18)
GFR, Est African American: 76 mL/min/{1.73_m2} (ref >=60)
GFR, Est Non African American: 66 mL/min/{1.73_m2} (ref >=60)
Glucose, Bld: 267 mg/dL — ABNORMAL HIGH (ref 65–99)
Potassium: 4.5 mmol/L (ref 3.5–5.3)
Sodium: 142 mmol/L (ref 135–146)

## 2019-08-28 LAB — HEMOGLOBIN A1C
Hgb A1c MFr Bld: 9.7 % of total Hgb — ABNORMAL HIGH (ref <5.7)
Mean Plasma Glucose: 232 (calc)
eAG (mmol/L): 12.8 (calc)

## 2019-08-30 ENCOUNTER — Ambulatory Visit (INDEPENDENT_AMBULATORY_CARE_PROVIDER_SITE_OTHER): Payer: Medicare Other | Admitting: Nurse Practitioner

## 2019-08-30 ENCOUNTER — Other Ambulatory Visit: Payer: Self-pay

## 2019-08-30 ENCOUNTER — Encounter: Payer: Self-pay | Admitting: Nurse Practitioner

## 2019-08-30 VITALS — BP 128/80 | HR 63 | Temp 97.5°F | Ht 68.0 in | Wt 246.0 lb

## 2019-08-30 DIAGNOSIS — Z23 Encounter for immunization: Secondary | ICD-10-CM

## 2019-08-30 DIAGNOSIS — I10 Essential (primary) hypertension: Secondary | ICD-10-CM

## 2019-08-30 DIAGNOSIS — C61 Malignant neoplasm of prostate: Secondary | ICD-10-CM | POA: Diagnosis not present

## 2019-08-30 DIAGNOSIS — I5022 Chronic systolic (congestive) heart failure: Secondary | ICD-10-CM

## 2019-08-30 DIAGNOSIS — Z794 Long term (current) use of insulin: Secondary | ICD-10-CM | POA: Diagnosis not present

## 2019-08-30 DIAGNOSIS — E1143 Type 2 diabetes mellitus with diabetic autonomic (poly)neuropathy: Secondary | ICD-10-CM

## 2019-08-30 DIAGNOSIS — I4891 Unspecified atrial fibrillation: Secondary | ICD-10-CM | POA: Diagnosis not present

## 2019-08-30 DIAGNOSIS — I739 Peripheral vascular disease, unspecified: Secondary | ICD-10-CM | POA: Diagnosis not present

## 2019-08-30 DIAGNOSIS — R195 Other fecal abnormalities: Secondary | ICD-10-CM

## 2019-08-30 MED ORDER — GLIPIZIDE 5 MG PO TABS: ORAL_TABLET | ORAL | 1 refills | Status: DC

## 2019-08-30 MED ORDER — JARDIANCE 10 MG PO TABS: 10.0000 mg | ORAL_TABLET | Freq: Every day | ORAL | 5 refills | Status: DC

## 2019-08-30 NOTE — Patient Instructions (Addendum)
If you do not hear back about referral (to the endocrinologist and Gastroenterologist)  in a week please call our office back and ask to speak with Lattie Haw (our referral coordinator) for an update.  Please complete paper work for [employer].  jardiance 10 mg by mouth daily for diabetes   Increase glipizide to 10 mg by mouth twice daily Continue all other medications.

## 2019-08-30 NOTE — Progress Notes (Signed)
Careteam: Patient Care Team: Lauree Chandler, NP as PCP - General (Geriatric Medicine) [name], MD as PCP - Cardiology (Cardiology) [name], MD as Attending Physician (Urology) [name], MD as Consulting Physician (Nephrology) [name], [place] (Optometry) Elayne Guerin, [facility] as [place] Management (Pharmacist) Simcox, Luiz Ochoa, CPhT as [employer] banker, corporate)  Advanced Directive information Does Patient Have a Medical Advance Directive?: Yes, Type of Advance Directive: Healthcare Power of [place];Living will, Does patient want to make changes to medical advance directive?: No - Patient declined  No Known Allergies  Chief Complaint  Patient presents with  . Medical Management of Chronic Issues    2 month follow-up, discuss labs (copy printed and given to patient), and discuss cologuard results(positive)   . Immunizations    Flu vaccine due   . Quality Metric Gaps    Foot exam due      HPI: Patient is a 78 y.o. male seen in the office today for routine follow up.  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  He has retired 3 times and continues to go back, needing to retire for good at 78 years old.   [facility] was consulted due to medication. He is getting toujeo through medication assistance.   DM- a1c unchanged. using 70 units of toujeo, glipizide 10 mg in the am and 5 mg in the pm, metformin 1000 mg by mouth twice daily.  Has not filled out form for jardiance.  Not made dietary changes.  Not doing any exercise. Endocrinology was consulted but decline visit. Reports once he closes on his business he could revisit this.    A fib- continues to get INR at coumadin clinic; not interested in [place] due to insurance. No palpitation, chest pains. No edema or shortness of breath.   CHF- weight stable  without signs of fluid retention. Continues on lasix, coreg. Has appt with cardiologist.   Hx Prostate cancer- s/p radiation and chemo about 20 years ago.currently following with urologist, last visit mid-summer had updated scan scheduled.   hyperlipidemia continues on Lipitor dialy  Review of Systems:  Review of Systems  Constitutional: Negative for chills, diaphoresis and fever.  Eyes: Negative for blurred vision, photophobia and pain.  Respiratory: Negative for cough, shortness of breath and wheezing.   Cardiovascular: Negative for chest pain, palpitations, claudication and leg swelling.  Gastrointestinal: Negative for abdominal pain, blood in stool, constipation, diarrhea, heartburn, nausea and vomiting.  Genitourinary: Negative for dysuria and urgency.  Musculoskeletal: Negative for back pain and myalgias.  Skin: Negative for rash.  Neurological: Negative for dizziness, tingling, sensory change, speech change and headaches.    Past Medical History:  Diagnosis Date  . Atrial fibrillation (HCC)    a. coumadin;  b. Amiodarone  . CAD (coronary artery disease)    a.  Lexiscan Myoview (02/14/14):  Apical, inferolateral scar; ? Mild ischemia; EF 32%.;  b.  LHC (02/21/14):  dLM 100%, LAD 100%, CFX 100%, dRCA 50%. => CABG (L-LAD, S-OM, S-PDA)  . Chronic systolic heart failure ([place])   . CKD (chronic kidney disease)    proteinuria  . Hemorrhage of gastrointestinal tract, unspecified   . Hemorrhage of rectum and anus   . HLD (hyperlipidemia)   . HTN (hypertension)   . Impotence of organic origin   . Ischemic cardiomyopathy    a. Echo (  02/17/14):  EF 35-40%, anteroseptal and apical AK, Gr 2 DD, MAC, mod LAE.  [name] Nephrolithiasis   . Peripheral vascular disease, unspecified ([place])   . Prostate cancer ([place])   . Thrombocytopenia, unspecified ([place])   . Tobacco use disorder   . Type II or unspecified type diabetes mellitus with renal manifestations, uncontrolled(250.42)    Past Surgical  History:  Procedure Laterality Date  . BACK SURGERY    . CATARACT EXTRACTION  2010  . CLIPPING OF ATRIAL APPENDAGE Left 02/24/2014   Procedure: CLIPPING OF ATRIAL APPENDAGE;  Surgeon: Gaye Pollack, MD;  Location: [facility];  Service: Open Heart Surgery;  Laterality: Left;  . COLONOSCOPY  2005   Normal  . CORONARY ARTERY BYPASS GRAFT N/A 02/24/2014   Procedure: Coronary artery bypass graft times three using left internal mammary artery and left leg greater saphenous vein harvested endoscopically.;  Surgeon: Gaye Pollack, MD;  Location: [facility] OR;  Service: Open Heart Surgery;  Laterality: N/A;  . INTRAOPERATIVE TRANSESOPHAGEAL ECHOCARDIOGRAM N/A 02/24/2014   Procedure: INTRAOPERATIVE TRANSESOPHAGEAL ECHOCARDIOGRAM;  Surgeon: Gaye Pollack, MD;  Location: [facility] OR;  Service: Open Heart Surgery;  Laterality: N/A;  . LEFT HEART CATHETERIZATION WITH CORONARY ANGIOGRAM N/A 02/21/2014   Procedure: LEFT HEART CATHETERIZATION WITH CORONARY ANGIOGRAM;  Surgeon: Peter M Martinique, MD;  Location: [facility] CATH LAB;  Service: Cardiovascular;  Laterality: N/A;   Social History:   reports that he has never smoked. He quit smokeless tobacco use about 5 years ago.  His smokeless tobacco use included chew. He reports that he does not drink alcohol or use drugs.  Family History  Problem Relation Age of Onset  . Cancer Mother 26       leukemia  . Heart disease Father   . Heart attack Father 49  . Lung cancer Brother     Medications: Patient's Medications  New Prescriptions   No medications on file  Previous Medications   AMLODIPINE (NORVASC) 5 MG TABLET    Take 5 mg by mouth daily.   ASPIRIN EC 81 MG TABLET    Take 1 tablet (81 mg total) by mouth daily.   ATORVASTATIN (LIPITOR) 40 MG TABLET    TAKE ONE TABLET BY MOUTH DAILY   CALCIUM CARBONATE-VITAMIN D 600-200 MG-UNIT TABS    Take 1 tablet by mouth daily. For bones    CARVEDILOL (COREG) 25 MG TABLET    Take 25 mg by mouth 2 (two) times daily with a meal.   FLAXSEED,  LINSEED, (FLAXSEED OIL PO)    Take 1 tablet by mouth daily.    FUROSEMIDE (LASIX) 40 MG TABLET    TAKE TWO TABLETS BY MOUTH IN THE MORNING AND ONE TABLET EVERY EVENING. Please keep upcoming appt for future refills. Thank you   GLIPIZIDE (GLUCOTROL) 5 MG TABLET    TAKE TWO TABLETS BY MOUTH IN THE MORNING AND TAKE ONE TABLET BY MOUTH IN THE EVENING TO CONTROL BLOOD SUGAR   INSULIN GLARGINE, 1 UNIT DIAL, (TOUJEO SOLOSTAR) 300 UNIT/ML SOPN    Inject 70 Units into the skin daily.   LISINOPRIL (PRINIVIL,ZESTRIL) 40 MG TABLET    Take 40 mg by mouth 2 (two) times daily.   METFORMIN (GLUCOPHAGE) 1000 MG TABLET    TAKE ONE TABLET BY MOUTH TWICE DAILY with a meal   MULTIPLE VITAMIN (MULTIVITAMINS PO)    Take 1 tablet by mouth daily.     POTASSIUM CHLORIDE SA (K-DUR) 20 MEQ TABLET    TAKE ONE TABLET BY  MOUTH ONE TIME DAILY    WARFARIN (COUMADIN) 5 MG TABLET    TAKE ONE AND ONE HALF TABLETS BY MOUTH DAILY OR AS DIRECTED BY COUMADIN CLINIC  Modified Medications   No medications on file  Discontinued Medications   No medications on file    Physical Exam:  Vitals:   08/30/19 0950  BP: 128/80  Pulse: 63  Temp: (!) 97.5 F (36.4 C)  TempSrc: Temporal  SpO2: 96%  Weight: 246 lb (111.6 kg)  Height: _0  (1.727 m)   Body mass index is 37.4 kg/m. Wt Readings from Last 3 Encounters:  08/30/19 246 lb (111.6 kg)  05/23/19 249 lb (112.9 kg)  05/23/19 249 lb (112.9 kg)    Physical Exam Constitutional:      Appearance: Normal appearance. He is obese.  HENT:     Head: Normocephalic and atraumatic.     Nose: Nose normal.  Eyes:     Extraocular Movements: Extraocular movements intact.     Conjunctiva/sclera: Conjunctivae normal.     Pupils: Pupils are equal, round, and reactive to light.  Neck:     Musculoskeletal: Normal range of motion.  Cardiovascular:     Rate and Rhythm: Rhythm irregular.     Pulses:          Dorsalis pedis pulses are 1+ on the right side and 1+ on the left side.   Pulmonary:     Effort: Pulmonary effort is normal.     Breath sounds: Normal breath sounds.  Abdominal:     General: Abdomen is flat. Bowel sounds are normal.     Palpations: Abdomen is soft.  Musculoskeletal:        General: No tenderness.     Right lower leg: No edema.     Left lower leg: No edema.  Feet:     Right foot:     Protective Sensation: 3 sites tested. 3 sites sensed.     Skin integrity: Skin integrity normal.     Left foot:     Protective Sensation: 3 sites tested. 3 sites sensed.     Skin integrity: Skin integrity normal.  Skin:    General: Skin is warm and dry.     Capillary Refill: Capillary refill takes less than 2 seconds.     Findings: No rash.  Neurological:     General: No focal deficit present.     Mental Status: He is alert and oriented to person, place, and time. Mental status is at baseline.  Psychiatric:        Mood and Affect: Mood normal.        Behavior: Behavior normal.        Thought Content: Thought content normal.     Labs reviewed: Basic Metabolic Panel: Recent Labs    02/08/19 1055 05/07/19 0900 08/27/19 0853  NA 141 140 142  K 4.2 4.0 4.5  CL 103 105 106  CO2 _1 GLUCOSE 347* 253* 267*  BUN _2 CREATININE 0.97 1.01 1.08  CALCIUM 9.3 9.1 9.1   Liver Function Tests: Recent Labs    11/02/18 0902 02/08/19 1055  AST 14 13  ALT 18 16  BILITOT 0.7 0.5  PROT 6.3 6.5   No results for input(s): LIPASE, AMYLASE in the last 8760 hours. No results for input(s): AMMONIA in the last 8760 hours. CBC: Recent Labs    02/08/19 1055  WBC 7.1  NEUTROABS 4,359  HGB 14.2  HCT 41.7  MCV 83.2  PLT 145   Lipid Panel: Recent Labs    11/02/18 0902  CHOL 98  HDL 34*  LDLCALC 39  TRIG 167*  CHOLHDL 2.9   TSH: No results for input(s): TSH in the last 8760 hours. A1C: Lab Results  Component Value Date   HGBA1C 9.7 (H) 08/27/2019     Assessment/Plan 1. Need for influenza vaccination - Flu Vaccine QUAD High  Dose(Fluad)  2. Positive colorectal cancer screening using Cologuard test - Ambulatory referral to Gastroenterology  3. Atrial fibrillation, unspecified type ([place]) Rate controlled, continues on coumadin for anticoagulation.   4. Chronic systolic congestive heart failure (HCC) Stable, appears euvolemic, continues on lasix, coreg and lisinopril  5. Type 2 diabetes mellitus with diabetic autonomic neuropathy, with long-term current use of insulin ([place]) -uncontrolled, working with [facility] for helping with medication cost.  -Encouraged dietary compliance, routine foot care/monitoring and to keep up with diabetic eye exams through ophthalmology  - Ambulatory referral to Endocrinology -increase glipizide to 10 mg bid. - empagliflozin (JARDIANCE) 10 MG TABS tablet; Take 10 mg by mouth daily before breakfast.  Dispense: 30 tablet; Refill: 5 - discussed with the patient the pathophysiology of diabetes and the natural progression of the disease.  -stressed the importance of lifestyle changes including diet and exercise. -discussed complications associated with diabetes including retinopathy, nephropathy, neuropathy as well as increased risk of cardiovascular disease. We went over the benefit seen with glycemic control.   6. Hypertension, benign -stable at this time, continues on norvasc, coreg, lasix and lisinopril. Stressed importance of dietary modifications.   7. PVD (peripheral vascular disease) (HCC) Stable, continues on ASA.   8. Prostate cancer [facility]) Ongoing follow up with urology due for repeat imaging. Pt to request recent records to be sent to our office.  Next appt: 6 weeks for diabetes follow up [place]. [place], [place] [phone]

## 2019-09-04 ENCOUNTER — Telehealth: Payer: Self-pay | Admitting: *Deleted

## 2019-09-04 NOTE — Telephone Encounter (Signed)
Received fax from [place] [phone] requesting refill on patient's Toujeo insulin. Filled out and faxed back to [employer]: [phone]

## 2019-09-10 ENCOUNTER — Telehealth: Payer: Self-pay

## 2019-09-10 NOTE — Telephone Encounter (Signed)
Called patient to inform him toujeo was delivered and ready to pick up at office when he was available. Patient verbalized understanding.

## 2019-09-23 ENCOUNTER — Other Ambulatory Visit: Payer: Self-pay | Admitting: Pharmacy Technician

## 2019-09-23 NOTE — Patient Outreach (Signed)
[facility]) Care Management  09/23/2019  Seth [name] [birth year] [id number]  Successful outreach call placed to patient in regards to [place] application for Jardiance.  Spoke to patient, HIPAA identifiers verified.  Patient informed that [employer] has the printout ready for the patient to pick up but he has not had a chance to go by and get the printout. Patient informed he has been out of town and is in the process of moving his office.  Confirmed patient had phone number. Patient was instructed to call when he has picked up the printout so that the address could be provided to him to mail back the required information. Patient was agreeable and verbalized understanding of the plan.  Will followup with patient in 10-20 business days if information has not been received.  Arthor Gorter P. Armen Waring, [place] Management [phone]

## 2019-09-26 ENCOUNTER — Ambulatory Visit: Payer: Self-pay | Admitting: Pharmacist

## 2019-09-30 ENCOUNTER — Other Ambulatory Visit: Payer: Self-pay

## 2019-09-30 ENCOUNTER — Ambulatory Visit (INDEPENDENT_AMBULATORY_CARE_PROVIDER_SITE_OTHER): Payer: Medicare Other | Admitting: Cardiovascular Disease

## 2019-09-30 ENCOUNTER — Encounter: Payer: Self-pay | Admitting: Cardiovascular Disease

## 2019-09-30 ENCOUNTER — Ambulatory Visit (INDEPENDENT_AMBULATORY_CARE_PROVIDER_SITE_OTHER): Payer: Medicare Other | Admitting: *Deleted

## 2019-09-30 VITALS — BP 130/70 | HR 68 | Ht 68.0 in | Wt 245.4 lb

## 2019-09-30 DIAGNOSIS — I4891 Unspecified atrial fibrillation: Secondary | ICD-10-CM | POA: Diagnosis not present

## 2019-09-30 DIAGNOSIS — I5022 Chronic systolic (congestive) heart failure: Secondary | ICD-10-CM

## 2019-09-30 DIAGNOSIS — I255 Ischemic cardiomyopathy: Secondary | ICD-10-CM | POA: Diagnosis not present

## 2019-09-30 DIAGNOSIS — E7800 Pure hypercholesterolemia, unspecified: Secondary | ICD-10-CM

## 2019-09-30 DIAGNOSIS — I10 Essential (primary) hypertension: Secondary | ICD-10-CM | POA: Diagnosis not present

## 2019-09-30 DIAGNOSIS — Z5181 Encounter for therapeutic drug level monitoring: Secondary | ICD-10-CM

## 2019-09-30 DIAGNOSIS — I2510 Atherosclerotic heart disease of native coronary artery without angina pectoris: Secondary | ICD-10-CM | POA: Diagnosis not present

## 2019-09-30 DIAGNOSIS — I480 Paroxysmal atrial fibrillation: Secondary | ICD-10-CM | POA: Diagnosis not present

## 2019-09-30 LAB — POCT INR: INR: 2.5 (ref 2.0–3.0)

## 2019-09-30 NOTE — Patient Instructions (Addendum)
Medication Instructions:  No changes If you need a refill on your cardiac medications before your next appointment, please call your pharmacy.   Lab work: none If you have labs (blood work) drawn today and your tests are completely normal, you will receive your results only by: [name] MyChart Message (if you have MyChart) OR . A paper copy in the mail If you have any lab test that is abnormal or we need to change your treatment, we will call you to review the results.  Testing/Procedures: Your physician has requested that you have an echocardiogram. Echocardiography is a painless test that uses sound waves to create images of your heart. It provides your doctor with information about the size and shape of your heart and how well your heart's chambers and valves are working. This procedure takes approximately one hour. There are no restrictions for this procedure.   Follow-Up: At [facility], you and your health needs are our priority.  As part of our continuing mission to provide you with exceptional heart care, we have created designated Provider Care Teams.  These Care Teams include your primary Cardiologist (physician) and Advanced Practice Providers (APPs -  Physician Assistants and Nurse Practitioners) who all work together to provide you with the care you need, when you need it. You will need a follow up appointment in 12 months.  Please call our office 2 months in advance to schedule this appointment.  You may see Lauree Chandler, MD or one of the following Advanced Practice Providers on your designated Care Team:   [place], PA-C Melina Copa, PA-C . Ermalinda Barrios, PA-C  Any Other Special Instructions Will Be Listed Below (If Applicable).

## 2019-09-30 NOTE — Patient Instructions (Signed)
Description   Continue taking 1.5 tablets daily. Recheck in 6 weeks. Call with any new medications [phone]      

## 2019-09-30 NOTE — Progress Notes (Signed)
Chief Complaint  Patient presents with  . Follow-up    CAD    History of Present Illness: 78 yo male with a history of atrial fibrillation, DM, HTN, HLD, CAD s/p CABG, prostate CA, renal insufficiency and obesity here today for cardiac follow up. He was seen as a new patient 02/12/14 for evaluation of atrial fibrillation and was placed on Xarelto. Stress test was abnormal with apical and inferolateral scar and ischemia, LVEF of 32%. Echo 02/17/14 with LVEF=35-40% with akinesis of the mid-distalanteroseptal and apical myocardium, MAC, dilated LA. Cardiac cath 02/21/14 with occluded left main, moderate RCA stenosis with the entire left system filling from right to left collaterals. 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 Echo 05/28/14 with LVEF=35-40%, concentric LVH, mild MR. He has done well following his bypass surgery.    He is here today for follow up. The patient denies any chest pain, dyspnea, palpitations, lower extremity edema, orthopnea, PND, dizziness, near syncope or syncope.   Primary Care Physician: Lauree Chandler, NP  Past Medical History:  Diagnosis Date  . Atrial fibrillation (HCC)    a. coumadin;  b. Amiodarone  . CAD (coronary artery disease)    a.  Lexiscan Myoview (02/14/14):  Apical, inferolateral scar; ? Mild ischemia; EF 32%.;  b.  LHC (02/21/14):  dLM 100%, LAD 100%, CFX 100%, dRCA 50%. => CABG (L-LAD, S-OM, S-PDA)  . Chronic systolic heart failure ([place])   . CKD (chronic kidney disease)    proteinuria  . Hemorrhage of gastrointestinal tract, unspecified   . Hemorrhage of rectum and anus   . HLD (hyperlipidemia)   . HTN (hypertension)   . Impotence of organic origin   . Ischemic cardiomyopathy    a. Echo (02/17/14):  EF 35-40%, anteroseptal and apical AK, Gr 2 DD, MAC, mod LAE.  [name] Nephrolithiasis   . Peripheral vascular disease, unspecified ([place])   . Prostate cancer ([place])   . Thrombocytopenia, unspecified ([place])   . Tobacco use  disorder   . 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 BACK SURGERY    . CATARACT EXTRACTION  2010  . CLIPPING OF ATRIAL APPENDAGE Left 02/24/2014   Procedure: CLIPPING OF ATRIAL APPENDAGE;  Surgeon: Gaye Pollack, MD;  Location: [place];  Service: Open Heart Surgery;  Laterality: Left;  . COLONOSCOPY  2005   Normal  . CORONARY ARTERY BYPASS GRAFT N/A 02/24/2014   Procedure: Coronary artery bypass graft times three using left internal mammary artery and left leg greater saphenous vein harvested endoscopically.;  Surgeon: Gaye Pollack, MD;  [facility];  Service: Open Heart Surgery;  Laterality: N/A;  . INTRAOPERATIVE TRANSESOPHAGEAL ECHOCARDIOGRAM N/A 02/24/2014   Procedure: INTRAOPERATIVE TRANSESOPHAGEAL ECHOCARDIOGRAM;  Surgeon: Gaye Pollack, MD;  Location: [place] OR;  Service: Open Heart Surgery;  Laterality: N/A;  . LEFT HEART CATHETERIZATION WITH CORONARY ANGIOGRAM N/A 02/21/2014   Procedure: LEFT HEART CATHETERIZATION WITH CORONARY ANGIOGRAM;  Surgeon: Peter M Martinique, MD;  [facility] LAB;  Service: Cardiovascular;  Laterality: N/A;    Current Outpatient Medications  Medication Sig Dispense Refill  . amLODipine (NORVASC) 5 MG tablet Take 5 mg by mouth daily.    [name] aspirin EC 81 MG tablet Take 1 tablet (81 mg total) by mouth daily.    [name] atorvastatin (LIPITOR) 40 MG tablet TAKE ONE TABLET BY MOUTH DAILY  90 tablet 1  . Calcium Carbonate-Vitamin D 600-200 MG-UNIT TABS Take 1 tablet by mouth daily. For bones     . carvedilol (COREG) 25 MG tablet Take 25 mg by mouth 2 (two) times daily with a meal.    . Flaxseed, Linseed, (FLAXSEED OIL PO) Take 1 tablet by mouth daily.     . furosemide (LASIX) 40 MG tablet TAKE TWO TABLETS BY MOUTH IN THE MORNING AND ONE TABLET EVERY EVENING. Please keep upcoming appt for future refills. Thank you 90 tablet 2  . glipiZIDE (GLUCOTROL) 5 MG tablet  TAKE TWO TABLETS BY MOUTH IN THE MORNING AND TAKE twoTABLET BY MOUTH IN THE EVENING TO CONTROL BLOOD SUGAR 270 tablet 1  . Insulin Glargine, 1 Unit Dial, (TOUJEO SOLOSTAR) 300 UNIT/ML SOPN Inject 70 Units into the skin daily. 15 mL 1  . lisinopril (PRINIVIL,ZESTRIL) 40 MG tablet Take 40 mg by mouth 2 (two) times daily.    . metFORMIN (GLUCOPHAGE) 1000 MG tablet TAKE ONE TABLET BY MOUTH TWICE DAILY with a meal 180 tablet 1  . Multiple Vitamin (MULTIVITAMINS PO) Take 1 tablet by mouth daily.      . potassium chloride SA (K-DUR) 20 MEQ tablet TAKE ONE TABLET BY MOUTH ONE TIME DAILY  90 tablet 1  . warfarin (COUMADIN) 5 MG tablet TAKE ONE AND ONE HALF TABLETS BY MOUTH DAILY OR AS DIRECTED BY COUMADIN CLINIC 50 tablet 2  . empagliflozin (JARDIANCE) 10 MG TABS tablet Take 10 mg by mouth daily before breakfast. (Patient not taking: Reported on 09/30/2019) 30 tablet 5   No current facility-administered medications for this visit.     No Known Allergies  Social History   Socioeconomic History  . Marital status: Married    Spouse name: Not on file  . Number of children: 3  . Years of education: Not on file  . Highest education level: Not on file  Occupational History  . Occupation: Manufacturing engineer buildings  Social Needs  . Financial resource strain: Not on file  . Food insecurity    Worry: Not on file    Inability: Not on file  . Transportation needs    Medical: Not on file    Non-medical: Not on file  Tobacco Use  . Smoking status: Never Smoker  . Smokeless tobacco: Former Systems developer    Types: Chew  Substance and Sexual Activity  . Alcohol use: No  . Drug use: No  . Sexual activity: Never  Lifestyle  . Physical activity    Days per week: Not on file    Minutes per session: Not on file  . Stress: Not on file  Relationships  . Social Herbalist on phone: Not on file    Gets together: Not on file    Attends religious service: Not on file    Active member of club or  organization: Not on file    Attends meetings of clubs or organizations: Not on file    Relationship status: Not on file  . Intimate partner violence    Fear of current or ex partner: Not on file    Emotionally abused: Not on file    Physically abused: Not on file    Forced sexual activity: Not on file  Other Topics Concern  . Not on file  Social History Narrative  . Not on file    Family History  Problem Relation Age of Onset  . Cancer Mother 11  leukemia  . Heart disease Father   . Heart attack Father 38  . Lung cancer Brother     Review of Systems:  As stated in the HPI and otherwise negative.   BP 130/70   Pulse 68   Ht _0  (1.727 m)   Wt 245 lb 6.4 oz (111.3 kg)   SpO2 95%   BMI 37.31 kg/m   Physical Examination:  General: Well developed, well nourished, NAD  HEENT: OP clear, mucus membranes moist  SKIN: warm, dry. No rashes. Neuro: No focal deficits  Musculoskeletal: Muscle strength 5/5 all ext  Psychiatric: Mood and affect normal  Neck: No JVD, no carotid bruits, no thyromegaly, no lymphadenopathy.  Lungs:Clear bilaterally, no wheezes, rhonci, crackles Cardiovascular: Regular rate and rhythm. No murmurs, gallops or rubs. Abdomen:Soft. Bowel sounds present. Non-tender.  Extremities: No lower extremity edema. Pulses are 2 + in the bilateral DP/PT.  Echo 05/28/14: Left ventricle: The cavity size was normal. There was moderate concentric hypertrophy. Systolic function was moderately reduced. The estimated ejection fraction was in the range of 35% to 40%. There is akinesis of the mid-apicalanterior, inferoseptal, and apical myocardium. Features are consistent with a pseudonormal left ventricular filling pattern, with concomitant abnormal relaxation and increased filling pressure (grade 2 diastolic dysfunction). - Mitral valve: There was mild regurgitation. - Left atrium: The atrium was mildly to moderately dilated. - Right atrium: The atrium was mildly  dilated.  EKG:  EKG is ordered today. The ekg ordered today demonstrates  NSR, rate 68 bpm. Non-specific ST and T wave abnormality. Unchanged  Recent Labs: 02/08/2019: ALT 16; Hemoglobin 14.2; Platelets 145 08/27/2019: BUN 17; Creat 1.08; Potassium 4.5; Sodium 142   Lipid Panel    Component Value Date/Time   CHOL 98 11/02/2018 0902   CHOL 101 06/08/2016 0843   TRIG 167 (H) 11/02/2018 0902   HDL 34 (L) 11/02/2018 0902   HDL 37 (L) 06/08/2016 0843   CHOLHDL 2.9 11/02/2018 0902   VLDL 23 07/21/2017 0835   LDLCALC 39 11/02/2018 0902     Wt Readings from Last 3 Encounters:  09/30/19 245 lb 6.4 oz (111.3 kg)  08/30/19 246 lb (111.6 kg)  05/23/19 249 lb (112.9 kg)     Other studies Reviewed: Additional studies/ records that were reviewed today include: . Review of the above records demonstrates:    Assessment and Plan:   1. CAD s/p CABG without angina: He has no chest pain. Continue ASA, statin and beta blocker.     2. Atrial fibrillation, paroxysmal: Sinus today. Continue beta blocker and coumadin.   3. Ischemic cardiomyopathy: LVEF=35-40% by echo June 2015. Continue beta blocker and Ace-inh. Repeat echo now.   4. Chronic systolic CHF: Weight is stable. Volume status is ok. Continue Lasix.   5. HTN: BP is controlled. No changes    6. HLD: LDL at goal. Will continue statin. His lipids are checked in primary care  Current medicines are reviewed at length with the patient today.  The patient does not have concerns regarding medicines.  The following changes have been made:  no change  Labs/ tests ordered today include:   Orders Placed This Encounter  Procedures  . EKG 12-Lead  . ECHOCARDIOGRAM COMPLETE    Disposition:   FU with me in 12 months  Signed, Lauree Chandler, MD 09/30/2019 9:42 AM    [facility] [address], [place], [place]  [address] Phone: [phone]; Fax: [phone]

## 2019-10-01 ENCOUNTER — Other Ambulatory Visit: Payer: Self-pay | Admitting: Cardiovascular Disease

## 2019-10-01 ENCOUNTER — Encounter: Payer: Self-pay | Admitting: Nurse Practitioner

## 2019-10-02 ENCOUNTER — Ambulatory Visit (HOSPITAL_COMMUNITY): Payer: Medicare Other | Attending: Cardiovascular Disease

## 2019-10-02 ENCOUNTER — Other Ambulatory Visit: Payer: Self-pay | Admitting: Pharmacy Technician

## 2019-10-02 ENCOUNTER — Other Ambulatory Visit: Payer: Self-pay

## 2019-10-02 DIAGNOSIS — I255 Ischemic cardiomyopathy: Secondary | ICD-10-CM

## 2019-10-02 MED ORDER — PERFLUTREN LIPID MICROSPHERE
1.0000 mL | INTRAVENOUS | Status: AC | PRN
  Administered 2019-10-02: 2 mL via INTRAVENOUS

## 2019-10-02 NOTE — Patient Outreach (Signed)
[facility]) Care Management  10/02/2019  ONUR MORI 1941/07/01 [id number]  Incoming call received from patient in regards to [facility] application for Jardiance.  Spoke to patient, HIPAA identifiers verified.  Patient informed he picked up his [employer] printout that shows how much the Vania Rea will cost him. Provided patient  and his wife (patient was having a hard time hearing me, HIPAA verified)  the address for [facility]. Once the information is received, then I will resubmit to [facility] for re processing of his application.  Will followup with patient in 3-4 weeks if information has not been received.  Taylorann Tkach P. Zlata Alcaide, [place] Management [phone]

## 2019-10-09 ENCOUNTER — Other Ambulatory Visit: Payer: Self-pay

## 2019-10-09 ENCOUNTER — Encounter: Payer: Self-pay | Admitting: Internal Medicine

## 2019-10-09 ENCOUNTER — Ambulatory Visit (INDEPENDENT_AMBULATORY_CARE_PROVIDER_SITE_OTHER): Payer: Medicare Other | Admitting: Internal Medicine

## 2019-10-09 VITALS — BP 118/64 | HR 85 | Temp 97.6°F | Ht 68.0 in | Wt 245.6 lb

## 2019-10-09 DIAGNOSIS — Z794 Long term (current) use of insulin: Secondary | ICD-10-CM | POA: Diagnosis not present

## 2019-10-09 DIAGNOSIS — E1129 Type 2 diabetes mellitus with other diabetic kidney complication: Secondary | ICD-10-CM

## 2019-10-09 DIAGNOSIS — R809 Proteinuria, unspecified: Secondary | ICD-10-CM

## 2019-10-09 DIAGNOSIS — E1165 Type 2 diabetes mellitus with hyperglycemia: Secondary | ICD-10-CM

## 2019-10-09 DIAGNOSIS — E1143 Type 2 diabetes mellitus with diabetic autonomic (poly)neuropathy: Secondary | ICD-10-CM | POA: Diagnosis not present

## 2019-10-09 MED ORDER — INSULIN LISPRO PROT & LISPRO (75-25 MIX) 100 UNIT/ML KWIKPEN: 26.0000 [IU] | PEN_INJECTOR | Freq: Two times a day (BID) | SUBCUTANEOUS | 11 refills | Status: DC

## 2019-10-09 NOTE — Progress Notes (Signed)
Name: Seth [name]  MRN/ DOB: [id number], 1941/11/09   Age/ Sex: 78 y.o., male    PCP: Lauree Chandler, NP   Reason for Endocrinology Evaluation: Type 2 Diabetes Mellitus     Date of Initial Endocrinology Visit: 10/09/2019     PATIENT IDENTIFIER: Seth [name] is a 78 y.o. male with a past medical history of T2DM, HTN, PVD, CAD, A.Fib and Prostate Ca ( S/P chemo and radiation). The patient presented for initial endocrinology clinic visit on 10/09/2019 for consultative assistance with his diabetes management.    HPI: Seth [name] was    Diagnosed with T2DM years ago  Prior Medications tried/Intolerance: Jardiance - cost  Currently checking blood sugars 1 x / day,  before breakfast  Hypoglycemia episodes : no           Hemoglobin A1c has ranged from 6.4% in 2015, peaking at 11.7% in 2020. Patient required assistance for hypoglycemia: no Patient has required hospitalization within the last 1 year from hyper or hypoglycemia: no  In terms of diet, the patient eats 2 meals, snacks occasionally, avoids sugar-sweetened beverages     HOME DIABETES REGIMEN: Glipizide 5 mg , 10 mg mg BID  Metformin 1000 mg BID Toujeo 70 units     Statin: yes ACE-I/ARB: no Prior Diabetic Education: no   GLUCOSE LOG:  165-234 mg/dL    DIABETIC COMPLICATIONS: Microvascular complications:   Neuropathy   Denies: CKD, retinopathy   Last eye exam: Completed 12/2018  Macrovascular complications:   CAD, PVD  Denies: CVA   PAST HISTORY: Past Medical History:  Past Medical History:  Diagnosis Date  . Atrial fibrillation (HCC)    a. coumadin;  b. Amiodarone  . CAD (coronary artery disease)    a.  Lexiscan Myoview (02/14/14):  Apical, inferolateral scar; ? Mild ischemia; EF 32%.;  b.  LHC (02/21/14):  dLM 100%, LAD 100%, CFX 100%, dRCA 50%. => CABG (L-LAD, S-OM, S-PDA)  . Chronic systolic heart failure ([place])   . CKD (chronic kidney disease)    proteinuria  . Hemorrhage of  gastrointestinal tract, unspecified   . Hemorrhage of rectum and anus   . HLD (hyperlipidemia)   . HTN (hypertension)   . Impotence of organic origin   . Ischemic cardiomyopathy    a. Echo (02/17/14):  EF 35-40%, anteroseptal and apical AK, Gr 2 DD, MAC, mod LAE.  [name] Nephrolithiasis   . Peripheral vascular disease, unspecified ([place])   . Prostate cancer ([place])   . Thrombocytopenia, unspecified ([place])   . Tobacco use disorder   . 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 BACK SURGERY    . CATARACT EXTRACTION  2010  . CLIPPING OF ATRIAL APPENDAGE Left 02/24/2014   Procedure: CLIPPING OF ATRIAL APPENDAGE;  Surgeon: Gaye Pollack, MD;  Location: [place];  Service: Open Heart Surgery;  Laterality: Left;  . COLONOSCOPY  2005   Normal  . CORONARY ARTERY BYPASS GRAFT N/A 02/24/2014   Procedure: Coronary artery bypass graft times three using left internal mammary artery and left leg greater saphenous vein harvested endoscopically.;  Surgeon: Gaye Pollack, MD;  Location: [facility] OR;  Service: Open Heart Surgery;  Laterality: N/A;  . INTRAOPERATIVE TRANSESOPHAGEAL ECHOCARDIOGRAM N/A 02/24/2014   Procedure: INTRAOPERATIVE TRANSESOPHAGEAL ECHOCARDIOGRAM;  Surgeon: Gaye Pollack, MD;  Location: [place] OR;  Service: Open Heart Surgery;  Laterality: N/A;  .  LEFT HEART CATHETERIZATION WITH CORONARY ANGIOGRAM N/A 02/21/2014   Procedure: LEFT HEART CATHETERIZATION WITH CORONARY ANGIOGRAM;  Surgeon: Peter M Martinique, MD;  Location: [facility] CATH LAB;  Service: Cardiovascular;  Laterality: N/A;      Social History:  reports that he has never smoked. He quit smokeless tobacco use about 5 years ago.  His smokeless tobacco use included chew. He reports that he does not drink alcohol or use drugs. Family History:  Family History  Problem Relation Age of Onset  . Cancer Mother 59       leukemia  .  Heart disease Father   . Heart attack Father 58  . Lung cancer Brother      HOME MEDICATIONS: Allergies as of 10/09/2019   No Known Allergies     Medication List       Accurate as of October 09, 2019 10:17 AM. If you have any questions, ask your nurse or doctor.        amLODipine 5 MG tablet Commonly known as: NORVASC Take 5 mg by mouth daily.   aspirin EC 81 MG tablet Take 1 tablet (81 mg total) by mouth daily.   atorvastatin 40 MG tablet Commonly known as: LIPITOR TAKE ONE TABLET BY MOUTH DAILY   Calcium Carbonate-Vitamin D 600-200 MG-UNIT Tabs Take 1 tablet by mouth daily. For bones   carvedilol 25 MG tablet Commonly known as: COREG Take 25 mg by mouth 2 (two) times daily with a meal.   FLAXSEED OIL PO Take 1 tablet by mouth daily.   furosemide 40 MG tablet Commonly known as: LASIX take 2 tablets by mouth in the morning and 1 tablet every evening. Please keep upcoming appt for future refills.   glipiZIDE 5 MG tablet Commonly known as: GLUCOTROL TAKE TWO TABLETS BY MOUTH IN THE MORNING AND TAKE twoTABLET BY MOUTH IN THE EVENING TO CONTROL BLOOD SUGAR   Jardiance 10 MG Tabs tablet Generic drug: empagliflozin Take 10 mg by mouth daily before breakfast.   lisinopril 40 MG tablet Commonly known as: ZESTRIL Take 40 mg by mouth 2 (two) times daily.   metFORMIN 1000 MG tablet Commonly known as: GLUCOPHAGE TAKE ONE TABLET BY MOUTH TWICE DAILY with a meal   MULTIVITAMINS PO Take 1 tablet by mouth daily.   potassium chloride SA 20 MEQ tablet Commonly known as: KLOR-CON TAKE ONE TABLET BY MOUTH ONE TIME DAILY   Toujeo SoloStar 300 UNIT/ML Sopn Generic drug: Insulin Glargine (1 Unit Dial) Inject 70 Units into the skin daily.   warfarin 5 MG tablet Commonly known as: COUMADIN Take as directed by the anticoagulation clinic. If you are unsure how to take this medication, talk to your nurse or doctor. Original instructions: TAKE ONE AND ONE HALF TABLETS BY  MOUTH DAILY OR AS DIRECTED BY COUMADIN CLINIC        ALLERGIES: No Known Allergies   REVIEW OF SYSTEMS: A comprehensive ROS was conducted with the patient and is negative except as per HPI and below:  Review of Systems  Constitutional: Negative for chills and fever.  HENT: Negative for congestion and sore throat.   Eyes: Negative for blurred vision and pain.  Respiratory: Negative for cough and shortness of breath.   Cardiovascular: Negative for chest pain and palpitations.  Gastrointestinal: Negative for diarrhea and nausea.  Genitourinary: Positive for frequency.  Skin: Negative.   Neurological: Positive for tingling. Negative for tremors.  Psychiatric/Behavioral: Negative for depression. The patient is not nervous/anxious.  OBJECTIVE:   VITAL SIGNS: BP 118/64 (BP Location: Left Arm, Patient Position: Sitting, Cuff Size: Large)   Pulse 85   Temp 97.6 F (36.4 C)   Ht _0  (1.727 m)   Wt 245 lb 9.6 oz (111.4 kg)   SpO2 96%   BMI 37.34 kg/m    PHYSICAL EXAM:  General: Pt appears well and is in NAD  HEENT: Head: Unremarkable with good dentition. Oropharynx clear without exudate.  Eyes: External eye exam normal without stare, lid lag or exophthalmos.  EOM intact.   Neck: General: Supple without adenopathy or carotid bruits. Thyroid: Thyroid size normal.  No goiter or nodules appreciated. No thyroid bruit.  Lungs: Clear with good BS bilat with no rales, rhonchi, or wheezes  Heart: RRR with normal S1 and S2 and no gallops; no murmurs; no rub  Abdomen: Normoactive bowel sounds, soft, nontender, without masses or organomegaly palpable  Extremities:  Lower extremities - No pretibial edema. No lesions.  Skin: Normal texture and temperature to palpation. No rash noted. No Acanthosis nigricans/skin tags. No lipohypertrophy.  Neuro: MS is good with appropriate affect, pt is alert and Ox3    DM foot exam: 10/09/2019  The skin of the feet is intact without sores or  ulcerations. The pedal pulses are 2+ on right and 2+ on left. The sensation is absent to a screening 5.07, 10 gram monofilament bilaterally   DATA REVIEWED:  Lab Results  Component Value Date   HGBA1C 9.7 (H) 08/27/2019   HGBA1C 9.8 (H) 05/07/2019   HGBA1C 11.7 (H) 02/08/2019   Lab Results  Component Value Date   MICROALBUR 34.1 11/02/2018   LDLCALC 39 11/02/2018   CREATININE 1.08 08/27/2019   Lab Results  Component Value Date   MICRALBCREAT 897 (H) 11/02/2018    Lab Results  Component Value Date   CHOL 98 11/02/2018   HDL 34 (L) 11/02/2018   LDLCALC 39 11/02/2018   TRIG 167 (H) 11/02/2018   CHOLHDL 2.9 11/02/2018        ASSESSMENT / PLAN / RECOMMENDATIONS:   1) Type 2 Diabetes Mellitus, Poorly controlled, With Neuropathic and  Macrovascular complications - Most recent A1c of 9.7 %. Goal A1c < 7.0%.   Plan: GENERAL: I have discussed with the patient the pathophysiology of diabetes. We went over the natural progression of the disease. We talked about both insulin resistance and insulin deficiency. We stressed the importance of lifestyle changes including diet and exercise. I explained the complications associated with diabetes including retinopathy, nephropathy, neuropathy as well as increased risk of cardiovascular disease. We went over the benefit seen with glycemic control.  I explained to the patient that diabetic patients are at higher than normal risk for amputations.   We discussed the importance of lifestyle changes in improving glycemic control   We have reached out to [facility] to help with pt assistance program in obtaining Humalog Mix   MEDICATIONS:   - STOP Glipizide - STOP Toujeo  - Start Humalog MIX 26 units with Breakfast and 26 units with Supper  - Continue Metformin 1000 mg twice daily    EDUCATION / INSTRUCTIONS:  BG monitoring instructions: Patient is instructed to check his blood sugars 2 times a day, breakfast and supper.  Call [facility]  Endocrinology clinic if: BG persistently < 70 or > 300. . I reviewed the Rule of 15 for the treatment of hypoglycemia in detail with the patient. Literature supplied.   2) Diabetic complications:   Eye: Does not  have known diabetic retinopathy.   Neuro/ Feet: Does have known diabetic peripheral neuropathy.  Renal: Patient does not have known baseline CKD. He is on an ACEI/ARB at present.Check urine albumin/creatinine ratio yearly starting at time of diagnosis.    3) Lipids: Patient is on a statin. LDL  At goal.    4) Hypertension: He is  at goal of < 140/90 mmHg.       Signed electronically by: Mack Guise, MD  [facility]  [facility] [address] [place], [place] [address] Phone: [phone] FAX: [phone]   CC: Lauree Chandler, NP [place] [address] Phone: [phone]  Fax: [phone]    Return to Endocrinology clinic as below: Future Appointments  Date Time Provider [place]  10/11/2019 11:00 AM Lauree Chandler, NP [facility] None  10/25/2019 10:30 AM Elayne Guerin, [facility] None  11/11/2019 10:00 AM [facility]  05/26/2020  9:30 AM Lauree Chandler, NP [facility] None

## 2019-10-09 NOTE — Patient Instructions (Signed)
-   For now continue the current regimen - We are going to reach out to [facility] to get a new application for a different kind of insulin  - When you get the new insulin (Humalog MIX ) follow these instructions :  - STOP Glipizide - STOP Toujeo  - Start Humalog MIX 26 units with Breakfast and 26 units with Supper  - Continue Metformin 1000 mg twice daily     - Check sugar before breakfast and supper    - HOW TO TREAT LOW BLOOD SUGARS (Blood sugar LESS THAN 70 MG/DL)  Please follow the RULE OF 15 for the treatment of hypoglycemia treatment (when your (blood sugars are less than 70 mg/dL)    STEP 1: Take 15 grams of carbohydrates when your blood sugar is low, which includes:   3-4 GLUCOSE TABS  OR  3-4 OZ OF JUICE OR REGULAR SODA OR  ONE TUBE OF GLUCOSE GEL     STEP 2: RECHECK blood sugar in 15 MINUTES STEP 3: If your blood sugar is still low at the 15 minute recheck --> then, go back to STEP 1 and treat AGAIN with another 15 grams of carbohydrates.

## 2019-10-10 ENCOUNTER — Encounter: Payer: Self-pay | Admitting: Pharmacy Technician

## 2019-10-10 ENCOUNTER — Other Ambulatory Visit: Payer: Self-pay | Admitting: Pharmacy Technician

## 2019-10-10 NOTE — Patient Outreach (Signed)
[facility]) Care Management  10/10/2019  Seth [name] March 11, 1941 [id number]                                        Medication Assistance Referral  Referral From: Dr. Kelton Pillar  Medication/Company: Humalog Mix 75/25 / [employer] Patient application portion:  Mailed Provider application portion: Interoffice Mailed to Dr. Kelton Pillar Provider address/fax verified via: Call to office and office website    Follow up:  Will follow up with patient in 5-10 business days to confirm application(s) have been received.  Seth [name] [name] Sheridan, [place] Management [phone]

## 2019-10-11 ENCOUNTER — Other Ambulatory Visit: Payer: Self-pay

## 2019-10-11 ENCOUNTER — Ambulatory Visit (INDEPENDENT_AMBULATORY_CARE_PROVIDER_SITE_OTHER): Payer: Medicare Other | Admitting: Nurse Practitioner

## 2019-10-11 ENCOUNTER — Encounter: Payer: Self-pay | Admitting: Nurse Practitioner

## 2019-10-11 VITALS — BP 124/70 | HR 63 | Temp 97.5°F | Ht 68.0 in | Wt 247.0 lb

## 2019-10-11 DIAGNOSIS — Z794 Long term (current) use of insulin: Secondary | ICD-10-CM | POA: Diagnosis not present

## 2019-10-11 DIAGNOSIS — E1165 Type 2 diabetes mellitus with hyperglycemia: Secondary | ICD-10-CM

## 2019-10-11 DIAGNOSIS — R195 Other fecal abnormalities: Secondary | ICD-10-CM

## 2019-10-11 DIAGNOSIS — I255 Ischemic cardiomyopathy: Secondary | ICD-10-CM | POA: Diagnosis not present

## 2019-10-11 NOTE — Progress Notes (Signed)
Careteam: Patient Care Team: Lauree Chandler, NP as PCP - General (Geriatric Medicine) [name], MD as PCP - Cardiology (Cardiology) [name], MD as Attending Physician (Urology) [name], MD as Consulting Physician (Nephrology) [name], [place] (Optometry) Elayne Guerin, [facility] as [place] Management (Pharmacist) [place], Luiz Ochoa, CPhT as [employer] banker, corporate)  Advanced Directive information Does Patient Have a Medical Advance Directive?: Yes, Type of Advance Directive: Healthcare Power of [place];Living will, Does patient want to make changes to medical advance directive?: No - Patient declined  No Known Allergies  Chief Complaint  Patient presents with   Follow-up    6 week follow-up    Quality Metric Gaps    Per patient foot exam completed by endocrinologist      HPI: Patient is a 78 y.o. male seen in the office today for follow up on diabetes. Pt with a past medical history of T2DM, HTN, PVD, CAD, A.Fib and Prostate Ca ( S/P chemo and radiation). he was referred to endocrinologist and at first decline referral but he did go for evaluation on 10/09/2019.   It was recommended to stop glipizide and toujeo and start Humalog mix 26 units with breakfast and supper and to continue metformin 1000 mg BID  States he has not heard from pharmacy and has not made any changes until he hears from the pharmacy.      Review of Systems:  Review of Systems  Constitutional: Negative for chills, diaphoresis and fever.  Eyes: Negative for blurred vision, photophobia and pain.  Respiratory: Negative for cough, shortness of breath and wheezing.   Cardiovascular: Negative for chest pain, palpitations, claudication and leg swelling.  Gastrointestinal: Negative for abdominal pain, blood in stool, constipation, diarrhea, heartburn, nausea and vomiting.  Genitourinary: Negative for dysuria and urgency.    Musculoskeletal: Negative for back pain and myalgias.  Skin: Negative for rash.  Neurological: Negative for dizziness, tingling, sensory change, speech change and headaches.    Past Medical History:  Diagnosis Date   Atrial fibrillation ([place])    a. coumadin;  b. Amiodarone   CAD (coronary artery disease)    a.  Lexiscan Myoview (02/14/14):  Apical, inferolateral scar; ? Mild ischemia; EF 32%.;  b.  LHC (02/21/14):  dLM 100%, LAD 100%, CFX 100%, dRCA 50%. => CABG (L-LAD, S-OM, S-PDA)   Chronic systolic heart failure (HCC)    CKD (chronic kidney disease)    proteinuria   Hemorrhage of gastrointestinal tract, unspecified    Hemorrhage of rectum and anus    HLD (hyperlipidemia)    HTN (hypertension)    Impotence of organic origin    Ischemic cardiomyopathy    a. Echo (02/17/14):  EF 35-40%, anteroseptal and apical AK, Gr 2 DD, MAC, mod LAE.   Nephrolithiasis    Peripheral vascular disease, unspecified ([place])    Prostate cancer ([place])    Thrombocytopenia, unspecified ([place])    Tobacco use disorder    Type II or unspecified type diabetes mellitus with renal manifestations, uncontrolled(250.42)    Past Surgical History:  Procedure Laterality Date   BACK SURGERY     CATARACT EXTRACTION  2010   CLIPPING OF ATRIAL APPENDAGE Left 02/24/2014   Procedure: CLIPPING OF ATRIAL APPENDAGE;  Surgeon: Gaye Pollack, MD;  Location: [place];  Service: Open Heart Surgery;  Laterality: Left;   COLONOSCOPY  2005   Normal   CORONARY ARTERY BYPASS GRAFT N/A 02/24/2014   Procedure: Coronary  artery bypass graft times three using left internal mammary artery and left leg greater saphenous vein harvested endoscopically.;  Surgeon: Gaye Pollack, MD;  Location: [place];  Service: Open Heart Surgery;  Laterality: N/A;   INTRAOPERATIVE TRANSESOPHAGEAL ECHOCARDIOGRAM N/A 02/24/2014   Procedure: INTRAOPERATIVE TRANSESOPHAGEAL ECHOCARDIOGRAM;  Surgeon: Gaye Pollack, MD;  Location: [place] OR;  Service: Open  Heart Surgery;  Laterality: N/A;   LEFT HEART CATHETERIZATION WITH CORONARY ANGIOGRAM N/A 02/21/2014   Procedure: LEFT HEART CATHETERIZATION WITH CORONARY ANGIOGRAM;  Surgeon: Peter M Martinique, MD;  Location: [facility] C[facility] LAB;  Service: Cardiovascular;  Laterality: N/A;   Social History:   reports that he has never smoked. He quit smokeless tobacco use about 5 years ago.  His smokeless tobacco use included chew. He reports that he does not drink alcohol or use drugs.  Family History  Problem Relation Age of Onset   Cancer Mother 22       leukemia   Heart disease Father    Heart attack Father 71   Lung cancer Brother     Medications: Patient's Medications  New Prescriptions   No medications on file  Previous Medications   AMLODIPINE (NORVASC) 5 MG TABLET    Take 5 mg by mouth daily.   ASPIRIN EC 81 MG TABLET    Take 1 tablet (81 mg total) by mouth daily.   ATORVASTATIN (LIPITOR) 40 MG TABLET    TAKE ONE TABLET BY MOUTH DAILY   CALCIUM CARBONATE-VITAMIN D 600-200 MG-UNIT TABS    Take 1 tablet by mouth daily. For bones    CARVEDILOL (COREG) 25 MG TABLET    Take 25 mg by mouth 2 (two) times daily with a meal.   EMPAGLIFLOZIN (JARDIANCE) 10 MG TABS TABLET    Take 10 mg by mouth daily before breakfast.   FLAXSEED, LINSEED, (FLAXSEED OIL PO)    Take 1 tablet by mouth daily.    FUROSEMIDE (LASIX) 40 MG TABLET    take 2 tablets by mouth in the morning and 1 tablet every evening. Please keep upcoming appt for future refills.   INSULIN LISPRO PROT & LISPRO (HUMALOG MIX 75/25 KWIKPEN) (75-25) 100 UNIT/ML KWIKPEN    Inject 26 Units into the skin 2 (two) times daily with a meal.   LISINOPRIL (PRINIVIL,ZESTRIL) 40 MG TABLET    Take 40 mg by mouth 2 (two) times daily.   METFORMIN (GLUCOPHAGE) 1000 MG TABLET    TAKE ONE TABLET BY MOUTH TWICE DAILY with a meal   MULTIPLE VITAMIN (MULTIVITAMINS PO)    Take 1 tablet by mouth daily.     POTASSIUM CHLORIDE SA (K-DUR) 20 MEQ TABLET    TAKE ONE TABLET BY MOUTH  ONE TIME DAILY    WARFARIN (COUMADIN) 5 MG TABLET    TAKE ONE AND ONE HALF TABLETS BY MOUTH DAILY OR AS DIRECTED BY COUMADIN CLINIC  Modified Medications   No medications on file  Discontinued Medications   No medications on file    Physical Exam:  Vitals:   10/11/19 1046  BP: 124/70  Pulse: 63  Temp: (!) 97.5 F (36.4 C)  TempSrc: Temporal  SpO2: 96%  Weight: 247 lb (112 kg)  Height: _0  (1.727 m)   Body mass index is 37.56 kg/m. Wt Readings from Last 3 Encounters:  10/11/19 247 lb (112 kg)  10/09/19 245 lb 9.6 oz (111.4 kg)  09/30/19 245 lb 6.4 oz (111.3 kg)    Physical Exam Constitutional:  Appearance: Normal appearance. He is obese.  HENT:     Head: Normocephalic and atraumatic.     Nose: Nose normal.  Eyes:     Extraocular Movements: Extraocular movements intact.     Conjunctiva/sclera: Conjunctivae normal.     Pupils: Pupils are equal, round, and reactive to light.  Neck:     Musculoskeletal: Normal range of motion.  Cardiovascular:     Rate and Rhythm: Rhythm irregular.     Pulses:          Dorsalis pedis pulses are 1+ on the right side and 1+ on the left side.  Pulmonary:     Effort: Pulmonary effort is normal.     Breath sounds: Normal breath sounds.  Abdominal:     General: Abdomen is flat. Bowel sounds are normal.     Palpations: Abdomen is soft.  Musculoskeletal:        General: No tenderness.     Right lower leg: No edema.     Left lower leg: No edema.  Feet:     Right foot:     Protective Sensation: 3 sites tested. 3 sites sensed.     Skin integrity: Skin integrity normal.     Left foot:     Protective Sensation: 3 sites tested. 3 sites sensed.     Skin integrity: Skin integrity normal.  Skin:    General: Skin is warm and dry.     Capillary Refill: Capillary refill takes less than 2 seconds.     Findings: No rash.  Neurological:     General: No focal deficit present.     Mental Status: He is alert and oriented to person, place,  and time. Mental status is at baseline.  Psychiatric:        Mood and Affect: Mood normal.        Behavior: Behavior normal.        Thought Content: Thought content normal.     Labs reviewed: Basic Metabolic Panel: Recent Labs    02/08/19 1055 05/07/19 0900 08/27/19 0853  NA 141 140 142  K 4.2 4.0 4.5  CL 103 105 106  CO2 _0 GLUCOSE 347* 253* 267*  BUN _1 CREATININE 0.97 1.01 1.08  CALCIUM 9.3 9.1 9.1   Liver Function Tests: Recent Labs    11/02/18 0902 02/08/19 1055  AST 14 13  ALT 18 16  BILITOT 0.7 0.5  PROT 6.3 6.5   No results for input(s): LIPASE, AMYLASE in the last 8760 hours. No results for input(s): AMMONIA in the last 8760 hours. CBC: Recent Labs    02/08/19 1055  WBC 7.1  NEUTROABS 4,359  HGB 14.2  HCT 41.7  MCV 83.2  PLT 145   Lipid Panel: Recent Labs    11/02/18 0902  CHOL 98  HDL 34*  LDLCALC 39  TRIG 167*  CHOLHDL 2.9   TSH: No results for input(s): TSH in the last 8760 hours. A1C: Lab Results  Component Value Date   HGBA1C 9.7 (H) 08/27/2019     Assessment/Plan 1. Type 2 diabetes mellitus with hyperglycemia, with long-term current use of insulin ([place]) -saw endocrinologist 2 days ago, has not implemented changes yet because he has not gotten medication from the pharmacy- waiting on verification through pharmacy due to cost.  -continues on previous regimen. -has not been able to make many dietary modifications due to lifestyle.  -declines nutritionist consult.  -continues to follow with endocrine at this time  2. Positive colorectal cancer screening using Cologuard test -GI had reached out to pt but never got call back. Number provided in office for pt to call to set up appt due to positive cologuard screening.   Next appt: 3 months Jannis Atkins K. [place], [facility] [phone]

## 2019-10-11 NOTE — Patient Instructions (Signed)
Please call  Gastroenterologist to follow up due to your abnormal screening Address: [address], [place], [place] [address] Phone: [phone]  Please call your pharmacy in regards to new diabetic medication if you have not heard anything by next week

## 2019-10-17 ENCOUNTER — Other Ambulatory Visit: Payer: Self-pay | Admitting: Pharmacy Technician

## 2019-10-17 NOTE — Patient Outreach (Signed)
[facility]) Care Management  10/17/2019  Seth [name] 1941-08-07 [id number]    Received both provider and patient portion(s) of patient assistance application(s) for Humalog Mix 75/25 with [employer]. Faxed completed application and required documents into [employer] and [employer].  Will follow up with company(ies) in 5-7 business days to check status of application(s).  [name] [name], [place] Management [phone]

## 2019-10-18 ENCOUNTER — Telehealth: Payer: Self-pay

## 2019-10-18 NOTE — Telephone Encounter (Signed)
Received fax from [facility] patient assistance foundation stating that the pt has been approved for a 12 month period to receive insulin.

## 2019-10-22 ENCOUNTER — Other Ambulatory Visit: Payer: Self-pay | Admitting: Pharmacy Technician

## 2019-10-22 NOTE — Patient Outreach (Signed)
Seth [name]) Care Management  10/22/2019  Seth [name] Aug 19, 1941 [id number]   ADDENDUM  Successful outreach call placed to patient in regards to [place] application for Humalog Mix 75/25 kwikpen and BI application for [employer].  Spoke to patient, HIPAA identifiers verified.  Informed patient he had been approved to receive the Humalog Mix 75/25 from [place] patient assistance. Informed patient to call [facility] to schedule his delivery. Provided patient phone number [phone] to [facility]. Patient verbalized understanding.   Informed patient I have not received back his [employer] OOP report that his wife mailed back as indicated on 10/02/2019. Patient informed he did not think he was on this medication anymore. After checking his medication list, it appears this medication may have been discontinued as it is not showing on his medication list. An inbasket message was sent to both Dr. Kelton Pillar and NP Sherrie Mustache to inquire if patient is to be on the medication.  Will followup with patient/[facility]  in 2-5business days to inquire about medication shipment.  Seth [name], [place] Management [phone]

## 2019-10-22 NOTE — Patient Outreach (Signed)
[facility]) Care Management  10/22/2019  JENO CALLEROS 1941/04/15 [id number]   Care coordination call placed to [place] in regards to patient's Humalog Mix 45/62 kwikpen application.  Spoke to [place] who informed patient was APPROVED 10/18/2019-10/17/2020. Tonya informed an order was sent to the pharmacy on 10/18/2019 and in progress with the pharmacy. She informed it could take 10-30 days to be delivered.  Care coordination call placed to [place] in regards to [place] application for Humalog Mix 75/25 kwikpen.  Spoke to [place] who informed they need a prescription sent over as it did not come over from [place]. She informed application can be faxed to [phone].  Refax application to [employer]. Patient will be told to call in to schedule his delivery.  Will followup with patient and [facility] in 2-5 business days.  Elektra Wartman P. Malley Hauter, [place] Management [phone]

## 2019-10-23 ENCOUNTER — Encounter: Payer: Self-pay | Admitting: Nurse Practitioner

## 2019-10-24 ENCOUNTER — Other Ambulatory Visit: Payer: Self-pay | Admitting: Pharmacy Technician

## 2019-10-24 ENCOUNTER — Telehealth: Payer: Self-pay | Admitting: *Deleted

## 2019-10-24 NOTE — Patient Outreach (Signed)
[facility]) Care [name]  10/24/2019  Seth [name] [birth year] [id number]   Care coordination call received from Seth Mustache, Seth [name] office in regards to a voicemail that was left concerning patient's BI application for Jardiance.  Spoke to Seth [name] who informed Seth [name] was not on current patient's medication list and he has been referred to endocrinology.  Received the following response from Seth [name] regarding the Jardiance quesiton: Seth [name], Seth Crazier, Seth [name]  Seth Chandler, Seth [name]  Cc: Seth [name], Seth [name]        Thank you so much for helping out with the patient assistance .   My priority during my initial visit with him is to bring his glucose readings down and this why the insulin change was made, I didn't delve too much in to his oral glycemic agents as I knew they are not going to be as good as insulin. Once his glucose is well controlled on the insulin , will discuss add-on therapy and I will reach out to you.   Thanks again for all your help   Previous Messages  ----- Message -----  From: Seth [name], Seth [name]  Sent: 10/22/2019  1:55 PM EDT  To: Seth Stagers, Seth [name], *   Good afternoon Seth [name], and Seth [name] Seth [name],  Patient was approved to receive Humalog Mix 75/25 through [facility] until October 2021. He knows to stop the Toujeo when the Humalog Mix 75/25 arrives from the PAP.   We were trying to get him patient assistance for Jardiance but patient was not sure if he was to continue on this medication. It does not appear that it is on his current medication list but I was just wanting to double check with you both.   Thanks,  Seth [name]. Seth [name], [name]  [phone]     Will stop pursuing the appeals process with BI for Jardiance as patient is not currently on this medication. Will route note to Seth [name] for awareness purposes.  Seth [name], Seth [name] [phone]

## 2019-10-24 NOTE — Telephone Encounter (Signed)
Seth [name] with [facility] called wanting to verify if patient was taking Jardiance. Current Medication list does not show patient is taking medication. Jessica's last OV note stated that patient is following with Endocrinology and medication was D/C.

## 2019-10-25 ENCOUNTER — Ambulatory Visit: Payer: Self-pay | Admitting: Pharmacist

## 2019-10-28 ENCOUNTER — Other Ambulatory Visit: Payer: Self-pay | Admitting: Nurse Practitioner

## 2019-10-28 NOTE — Telephone Encounter (Signed)
Atorvastatin and Potassium approved. Metformin will be forwarded to patients endocrinologist

## 2019-10-29 ENCOUNTER — Other Ambulatory Visit: Payer: Self-pay | Admitting: Pharmacy Technician

## 2019-10-29 NOTE — Patient Outreach (Signed)
[facility]) Care Management  10/29/2019  Seth [name] Jul 19, 1941 [id number]   ADDENDUM  Successful outreach call placed to patient in regards to [place] application for Humalog Mix 75/25.  Spoke to patient, HIPAA identifiers verified.  Patient informed he received 5 boxes of Humalog Mix 75/25. Discussed refill procedure with patient. Patient informed he had the refill information and was going over it now as they included in the shipment. Patient denied having any other questions or concerns at this time. Confirmed patient had name and number.  Will route note to [place] that patient assistance has been completed and will remove myself from care team.  Seth [name]. Jervis Trapani, [place] Management [phone]

## 2019-10-29 NOTE — Patient Outreach (Signed)
[facility]) Care Management  10/29/2019  Seth [name] [birth year] [id number]   Care coordination call placed to [place] in regards to [place] application for Humalog Mix 75/25.  Spoke to [place] who informed medication was delivered today.  Will outreach patient.  Madelene Kaatz P. Rolonda Pontarelli, [place] Management [phone]

## 2019-11-04 ENCOUNTER — Other Ambulatory Visit: Payer: Self-pay | Admitting: *Deleted

## 2019-11-04 NOTE — Telephone Encounter (Signed)
Patient has [place].

## 2019-11-05 ENCOUNTER — Other Ambulatory Visit: Payer: Self-pay | Admitting: Pharmacist

## 2019-11-05 ENCOUNTER — Ambulatory Visit: Payer: Self-pay | Admitting: Pharmacist

## 2019-11-05 NOTE — Patient Outreach (Signed)
[facility]) Care Management  11/05/2019  Seth [name] 1941/02/17 [id number]   Patient was called to follow up on medication assistance. Unfortunately, he did not answer his phone. HIPAA compliant message was left on his voicemail.    Patient received 5 boxes of Humalog Mix 75/25 from [employer] [facility].  He also communicated to [employer] that he understood how to reorder his medication.  Plan: Close patient's case. Send closure letters to patient and PCP.  Elayne Guerin, PharmD, [place] Clinical Pharmacist [phone]

## 2019-11-06 ENCOUNTER — Encounter: Payer: Self-pay | Admitting: Nurse Practitioner

## 2019-11-06 ENCOUNTER — Other Ambulatory Visit: Payer: Self-pay

## 2019-11-06 ENCOUNTER — Ambulatory Visit (INDEPENDENT_AMBULATORY_CARE_PROVIDER_SITE_OTHER): Payer: Medicare Other | Admitting: Nurse Practitioner

## 2019-11-06 ENCOUNTER — Telehealth: Payer: Self-pay

## 2019-11-06 VITALS — BP 122/70 | HR 64 | Temp 97.8°F | Ht 70.0 in | Wt 243.5 lb

## 2019-11-06 DIAGNOSIS — Z7901 Long term (current) use of anticoagulants: Secondary | ICD-10-CM | POA: Diagnosis not present

## 2019-11-06 DIAGNOSIS — Z1159 Encounter for screening for other viral diseases: Secondary | ICD-10-CM

## 2019-11-06 DIAGNOSIS — R195 Other fecal abnormalities: Secondary | ICD-10-CM | POA: Diagnosis not present

## 2019-11-06 MED ORDER — NA SULFATE-K SULFATE-MG SULF 17.5-3.13-1.6 GM/177ML PO SOLN: ORAL | 0 refills | Status: DC

## 2019-11-06 NOTE — Telephone Encounter (Signed)
Ok to hold coumadin 5 days prior to his procedure.   Lauree Chandler

## 2019-11-06 NOTE — Telephone Encounter (Signed)
[facility] Pre-operative Risk Assessment     Request for surgical clearance:     Endoscopy Procedure  What type of surgery is being performed?     Colonoscopy  When is this surgery scheduled?     11/15/19  What type of clearance is required ?   Pharmacy  Are there any medications that need to be held prior to surgery and how long? HOLD COUMADIN FOR 5 DAYS PRIOR  Practice name and name of physician performing surgery?       Gastroenterology/Dr. Hilarie Fredrickson  What is your office phone and fax number?      Phone- [phone]  Fax- [phone] ATTN: Peter [place], RMA  Anesthesia type (None, local, MAC, general) ?       MAC

## 2019-11-06 NOTE — Progress Notes (Signed)
ASSESSMENT / PLAN:   58. 78 year old male with positive Cologuard.  No overt bleeding.  No focal GI symptoms.  -Patient has multiple medical problems but overall seems to be appropriate candidate for colonoscopy. The risks and benefits of colonoscopy with possible polypectomy / biopsies were discussed and the patient agrees to proceed.   2. Chronic anticoagulation.  -- Hold Coumadin for 5 days before procedure - will instruct when and how to resume after procedure. Patient understands that there is a low but real risk of cardiovascular event such as heart attack, stroke, or embolism /  thrombosis, or ischemia while off Coumadin. The patient consents to proceed. Will communicate by phone or EMR with patient's prescribing provider to confirm that holding Coumadin is reasonable in this case.   3. Hypertension - controlloed on medications  4. Chronic systolic heart failure. No acute problems. Recent Echo - EF 50-55%  5. DM2 - A1c improving 11.7 >>> 9.7   HPI:    Referring Provider:   Sherrie Mustache, NP    Reason for referral:    Positive Cologuard  Chief Complaint: Positive Cologuard.   Patient is a 78 year old male with PMH significant for DM2, obesity, hypertension, PVD, CAD / CABG in 1017, chronic systolic heart failure, recent EF 50 to 55%, A. fib, history of prostate cancer status post chemo and radiation.  He is followed by Cardiologist [name].   Patient says he had a colonoscopy many, many years ago.  He denies bowel changes, blood in stool, abdominal pain, weight loss.  No family history of colon cancer.   Patient is on Coumadin for history of atrial fibrillation.  He has no general medical complaints.  No chest pains no shortness of breath.   Data Reviewed:  Most recent CBC 02/08/2019 was normal.    Past Medical History:  Diagnosis Date  . Atrial fibrillation (HCC)    a. coumadin;  b. Amiodarone  . CAD (coronary artery disease)    a.  Lexiscan  Myoview (02/14/14):  Apical, inferolateral scar; ? Mild ischemia; EF 32%.;  b.  LHC (02/21/14):  dLM 100%, LAD 100%, CFX 100%, dRCA 50%. => CABG (L-LAD, S-OM, S-PDA)  . Chronic systolic heart failure ([place])   . CKD (chronic kidney disease)    proteinuria  . Hemorrhage of gastrointestinal tract, unspecified   . Hemorrhage of rectum and anus   . HLD (hyperlipidemia)   . HTN (hypertension)   . Impotence of organic origin   . Ischemic cardiomyopathy    a. Echo (02/17/14):  EF 35-40%, anteroseptal and apical AK, Gr 2 DD, MAC, mod LAE.  [name] Nephrolithiasis   . Peripheral vascular disease, unspecified ([place])   . Prostate cancer ([place])   . Thrombocytopenia, unspecified ([place])   . Tobacco use disorder   . Type II or unspecified type diabetes mellitus with renal manifestations, uncontrolled(250.42)      Past Surgical History:  Procedure Laterality Date  . BACK SURGERY    . CATARACT EXTRACTION  2010  . CLIPPING OF ATRIAL APPENDAGE Left 02/24/2014   Procedure: CLIPPING OF ATRIAL APPENDAGE;  Surgeon: Gaye Pollack, MD;  Location: [facility];  Service: Open Heart Surgery;  Laterality: Left;  . COLONOSCOPY  2005   Normal  . CORONARY ARTERY BYPASS GRAFT N/A 02/24/2014   Procedure: Coronary artery bypass graft times three using left internal mammary artery and left leg greater saphenous vein harvested endoscopically.;  Surgeon: Gaye Pollack, MD;  Location: [facility];  Service: Open Heart Surgery;  Laterality: N/A;  . INTRAOPERATIVE TRANSESOPHAGEAL ECHOCARDIOGRAM N/A 02/24/2014   Procedure: INTRAOPERATIVE TRANSESOPHAGEAL ECHOCARDIOGRAM;  Surgeon: Gaye Pollack, MD;  Location: [facility];  Service: Open Heart Surgery;  Laterality: N/A;  . LEFT HEART CATHETERIZATION WITH CORONARY ANGIOGRAM N/A 02/21/2014   Procedure: LEFT HEART CATHETERIZATION WITH CORONARY ANGIOGRAM;  Surgeon: Peter M Martinique, MD;  Location: [facility] CATH [facility];  Service: Cardiovascular;  Laterality: N/A;   Family History  Problem Relation Age of Onset  . Cancer  Mother 7       leukemia  . Heart disease Father   . Heart attack Father 67  . Lung cancer Brother    Social History   Tobacco Use  . Smoking status: Never Smoker  . Smokeless tobacco: Former Systems developer    Types: Chew  Substance Use Topics  . Alcohol use: No  . Drug use: No   Current Outpatient Medications  Medication Sig Dispense Refill  . amLODipine (NORVASC) 5 MG tablet Take 5 mg by mouth daily.    [name] aspirin EC 81 MG tablet Take 1 tablet (81 mg total) by mouth daily.    [name] atorvastatin (LIPITOR) 40 MG tablet TAKE ONE TABLET BY MOUTH ONE TIME DAILY  90 tablet 1  . Calcium Carbonate-Vitamin D 600-200 MG-UNIT TABS Take 1 tablet by mouth daily. For bones     . carvedilol (COREG) 25 MG tablet Take 25 mg by mouth 2 (two) times daily with a meal.    . Flaxseed, Linseed, (FLAXSEED OIL PO) Take 1 tablet by mouth daily.     . furosemide (LASIX) 40 MG tablet take 2 tablets by mouth in the morning and 1 tablet every evening. Please keep upcoming appt for future refills. 90 tablet 3  . Insulin Lispro Prot & Lispro (HUMALOG MIX 75/25 KWIKPEN) (75-25) 100 UNIT/ML Kwikpen Inject 26 Units into the skin 2 (two) times daily with a meal. 15 mL 11  . lisinopril (PRINIVIL,ZESTRIL) 40 MG tablet Take 40 mg by mouth 2 (two) times daily.    . metFORMIN (GLUCOPHAGE) 1000 MG tablet TAKE ONE TABLET BY MOUTH TWICE DAILY WITH A MEAL 180 tablet 1  . Multiple Vitamin (MULTIVITAMINS PO) Take 1 tablet by mouth daily.      . potassium chloride SA (KLOR-CON) 20 MEQ tablet TAKE ONE TABLET BY MOUTH ONE TIME DAILY  90 tablet 1  . warfarin (COUMADIN) 5 MG tablet TAKE ONE AND ONE HALF TABLETS BY MOUTH DAILY OR AS DIRECTED BY COUMADIN CLINIC 50 tablet 2   No current facility-administered medications for this visit.    No Known Allergies   Review of Systems: All systems reviewed and negative except where noted in HPI.   Physical Exam:    Wt Readings from Last 3 Encounters:  11/06/19 243 lb 8 oz (110.5 kg)  10/11/19 247  lb (112 kg)  10/09/19 245 lb 9.6 oz (111.4 kg)    BP 122/70 (BP Location: Left Arm, Patient Position: Sitting, Cuff Size: Normal)   Pulse 64   Temp 97.8 F (36.6 C) (Other (Comment))   Ht _0  (1.778 m)   Wt 243 lb 8 oz (110.5 kg)   BMI 34.94 kg/m  Constitutional:  Pleasant male in no acute distress. Psychiatric: Normal mood and affect. Behavior is normal. EENT: Pupils normal.  Conjunctivae are normal. No scleral icterus. Neck supple.  Cardiovascular: Normal rate, regular rhythm. No edema Pulmonary/chest: Effort normal and breath  sounds normal. No wheezing, rales or rhonchi. Abdominal: Soft, nondistended, nontender. Bowel sounds active throughout. There are no masses palpable. No hepatomegaly. Neurological: Alert and oriented to person place and time. Skin: Skin is warm and dry. No rashes noted.  Tye Savoy, NP  11/06/2019, 10:09 AM  Cc: Lauree Chandler, NP

## 2019-11-06 NOTE — Patient Instructions (Addendum)
If you are age 77 or older, your body mass index should be between 23-30. Your Body mass index is 34.94 kg/m. If this is out of the aforementioned range listed, please consider follow up with your Primary Care Provider.  If you are age 56 or younger, your body mass index should be between 19-25. Your Body mass index is 34.94 kg/m. If this is out of the aformentioned range listed, please consider follow up with your Primary Care Provider.   You have been scheduled for a colonoscopy. Please follow written instructions given to you at your visit today.  Please pick up your prep supplies at the pharmacy within the next 1-3 days. If you use inhalers (even only as needed), please bring them with you on the day of your procedure. Your physician has requested that you go to www.startemmi.com and enter the access code given to you at your visit today. This web site gives a general overview about your procedure. However, you should still follow specific instructions given to you by our office regarding your preparation for the procedure.  We have sent the following medications to your pharmacy for you to pick up at your convenience: suprep  You will be contacted by our office prior to your procedure for directions on holding your Coumadin.  If you do not hear from our office 1 week prior to your scheduled procedure, please call [phone] to discuss.   Thank you for choosing me and [facility].   Tye Savoy, NP

## 2019-11-07 ENCOUNTER — Telehealth: Payer: Self-pay

## 2019-11-07 NOTE — Telephone Encounter (Signed)
Spoke with patient this morning regarding holding his Coumadin for five days prior to his colonoscopy.  Per [name] okay to hold.  Patient verbalized understanding.

## 2019-11-07 NOTE — Telephone Encounter (Signed)
Made in error

## 2019-11-11 ENCOUNTER — Ambulatory Visit (INDEPENDENT_AMBULATORY_CARE_PROVIDER_SITE_OTHER): Payer: Medicare Other | Admitting: *Deleted

## 2019-11-11 ENCOUNTER — Other Ambulatory Visit: Payer: Self-pay

## 2019-11-11 DIAGNOSIS — Z5181 Encounter for therapeutic drug level monitoring: Secondary | ICD-10-CM

## 2019-11-11 DIAGNOSIS — I4891 Unspecified atrial fibrillation: Secondary | ICD-10-CM

## 2019-11-11 LAB — POCT INR: INR: 3.2 — AB (ref 2.0–3.0)

## 2019-11-11 NOTE — Patient Instructions (Addendum)
Once you resume your warfarin after your colonoscopy, take an extra 1/2 tablet for 2 days then resume your normal dose.  Description   Continue holding for your Colonoscopy. Please refer to pt instructions. Normal dose 1.5 tablets daily.  Recheck in 1 week after procedure. Call with any new medications [phone].

## 2019-11-13 ENCOUNTER — Other Ambulatory Visit: Payer: Self-pay | Admitting: Internal Medicine

## 2019-11-13 ENCOUNTER — Ambulatory Visit (INDEPENDENT_AMBULATORY_CARE_PROVIDER_SITE_OTHER): Payer: Medicare Other

## 2019-11-13 DIAGNOSIS — Z1159 Encounter for screening for other viral diseases: Secondary | ICD-10-CM

## 2019-11-13 LAB — SARS CORONAVIRUS 2 (TAT 6-24 HRS): SARS Coronavirus 2: NEGATIVE

## 2019-11-15 ENCOUNTER — Ambulatory Visit (AMBULATORY_SURGERY_CENTER): Payer: Medicare Other | Admitting: Internal Medicine

## 2019-11-15 ENCOUNTER — Encounter: Payer: Self-pay | Admitting: Internal Medicine

## 2019-11-15 ENCOUNTER — Other Ambulatory Visit: Payer: Self-pay

## 2019-11-15 VITALS — BP 131/68 | HR 54 | Temp 97.4°F | Resp 11 | Ht 70.0 in | Wt 243.0 lb

## 2019-11-15 DIAGNOSIS — D120 Benign neoplasm of cecum: Secondary | ICD-10-CM

## 2019-11-15 DIAGNOSIS — I10 Essential (primary) hypertension: Secondary | ICD-10-CM | POA: Diagnosis not present

## 2019-11-15 DIAGNOSIS — K5730 Diverticulosis of large intestine without perforation or abscess without bleeding: Secondary | ICD-10-CM

## 2019-11-15 DIAGNOSIS — D124 Benign neoplasm of descending colon: Secondary | ICD-10-CM | POA: Diagnosis not present

## 2019-11-15 DIAGNOSIS — D125 Benign neoplasm of sigmoid colon: Secondary | ICD-10-CM

## 2019-11-15 DIAGNOSIS — D123 Benign neoplasm of transverse colon: Secondary | ICD-10-CM

## 2019-11-15 DIAGNOSIS — R195 Other fecal abnormalities: Secondary | ICD-10-CM | POA: Diagnosis not present

## 2019-11-15 DIAGNOSIS — I4891 Unspecified atrial fibrillation: Secondary | ICD-10-CM | POA: Diagnosis not present

## 2019-11-15 DIAGNOSIS — I2510 Atherosclerotic heart disease of native coronary artery without angina pectoris: Secondary | ICD-10-CM | POA: Diagnosis not present

## 2019-11-15 MED ORDER — SODIUM CHLORIDE 0.9 % IV SOLN: 500.0000 mL | Freq: Once | INTRAVENOUS | Status: DC

## 2019-11-15 NOTE — Progress Notes (Signed)
Pt's states no medical or surgical changes since previsit or office visit.   CW vitals, SM IV and JB temps.

## 2019-11-15 NOTE — Progress Notes (Signed)
Called to room to assist during endoscopic procedure.  Patient ID and intended procedure confirmed with present staff. Received instructions for my participation in the procedure from the performing physician.  

## 2019-11-15 NOTE — Patient Instructions (Signed)
HANDOUTS PROVIDED ON: POLYPS, DIVERTICULOSIS, & HEMORRHOIDS   THE TAKEN TODAY HAVE BEEN SENT FOR PATHOLOGY.  THE RESULTS CAN TAKE 2-3 WEEKS TO RECEIVE.    YOU MAY RESUME YOUR PREVIOUS DIET AND MEDICATION SCHEDULE.  George YOU FOR ALLOWING [place] TO CARE FOR YOU TODAY!!!  YOU HAD AN ENDOSCOPIC PROCEDURE TODAY AT [facility]:   Refer to the procedure report that was given to you for any specific questions about what was found during the examination.  If the procedure report does not answer your questions, please call your gastroenterologist to clarify.  If you requested that your care partner not be given the details of your procedure findings, then the procedure report has been included in a sealed envelope for you to review at your convenience later.  YOU SHOULD EXPECT: Some feelings of bloating in the abdomen. Passage of more gas than usual.  Walking can help get rid of the air that was put into your GI tract during the procedure and reduce the bloating. If you had a lower endoscopy (such as a colonoscopy or flexible sigmoidoscopy) you may notice spotting of blood in your stool or on the toilet paper. If you underwent a bowel prep for your procedure, you may not have a normal bowel movement for a few days.  Please Note:  You might notice some irritation and congestion in your nose or some drainage.  This is from the oxygen used during your procedure.  There is no need for concern and it should clear up in a day or so.  SYMPTOMS TO REPORT IMMEDIATELY:   Following lower endoscopy (colonoscopy or flexible sigmoidoscopy):  Excessive amounts of blood in the stool  Significant tenderness or worsening of abdominal pains  Swelling of the abdomen that is new, acute  Fever of 100F or higher  For urgent or emergent issues, a gastroenterologist can be reached at any hour by calling [phone].   DIET:  We do recommend a small meal at first, but then you may proceed to your regular  diet.  Drink plenty of fluids but you should avoid alcoholic beverages for 24 hours.  ACTIVITY:  You should plan to take it easy for the rest of today and you should NOT DRIVE or use heavy machinery until tomorrow (because of the sedation medicines used during the test).    FOLLOW UP: Our staff will call the number listed on your records 48-72 hours following your procedure to check on you and address any questions or concerns that you may have regarding the information given to you following your procedure. If we do not reach you, we will leave a message.  We will attempt to reach you two times.  During this call, we will ask if you have developed any symptoms of COVID 19. If you develop any symptoms (ie: fever, flu-like symptoms, shortness of breath, cough etc.) before then, please call [phone].  If you test positive for Covid 19 in the 2 weeks post procedure, please call and report this information to [place].    If any biopsies were taken you will be contacted by phone or by letter within the next 1-3 weeks.  Please call us at [phone] if you have not heard about the biopsies in 3 weeks.    SIGNATURES/CONFIDENTIALITY: You and/or your care partner have signed paperwork which will be entered into your electronic medical record.  These signatures attest to the fact that that the information above on your After Visit Summary  has been reviewed and is understood.  Full responsibility of the confidentiality of this discharge information lies with you and/or your care-partner. 

## 2019-11-15 NOTE — Op Note (Signed)
[facility] Patient Name: Grantley Savage Procedure Date: 11/15/2019 11:37 AM MRN: [id number] Endoscopist: Jerene Bears , MD Age: 78 Referring MD:  Date of Birth: December 07, 1941 Gender: Male Account #: [id number] Procedure:                Colonoscopy Indications:              Positive Cologuard test Medicines:                Monitored Anesthesia Care Procedure:                Pre-Anesthesia Assessment:                           - Prior to the procedure, a History and Physical                            was performed, and patient medications and                            allergies were reviewed. The patient's tolerance of                            previous anesthesia was also reviewed. The risks                            and benefits of the procedure and the sedation                            options and risks were discussed with the patient.                            All questions were answered, and informed consent                            was obtained. Prior Anticoagulants: The patient has                            taken no previous anticoagulant or antiplatelet                            agents. ASA Grade Assessment: III - A patient with                            severe systemic disease. After reviewing the risks                            and benefits, the patient was deemed in                            satisfactory condition to undergo the procedure.                           After obtaining informed consent, the colonoscope  was passed under direct vision. Throughout the                            procedure, the patient's blood pressure, pulse, and                            oxygen saturations were monitored continuously. The                            Colonoscope was introduced through the anus and                            advanced to the cecum, identified by appendiceal                            orifice and ileocecal valve. The  colonoscopy was                            performed without difficulty. The patient tolerated                            the procedure well. The quality of the bowel                            preparation was good. The ileocecal valve,                            appendiceal orifice, and rectum were photographed. Scope In: 11:47:31 AM Scope Out: 12:05:31 PM Scope Withdrawal Time: 0 hours 16 minutes 53 seconds  Total Procedure Duration: 0 hours 18 minutes 0 seconds  Findings:                 The digital rectal exam was normal.                           A 1 mm polyp was found in the cecum. The polyp was                            sessile. The polyp was removed with a cold snare.                            Resection and retrieval were complete.                           Two sessile polyps were found in the transverse                            colon. The polyps were 4 to 5 mm in size. These                            polyps were removed with a cold snare. Resection  and retrieval were complete.                           A 5 mm polyp was found in the descending colon. The                            polyp was sessile. The polyp was removed with a                            cold snare. Resection and retrieval were complete.                           Two sessile polyps were found in the sigmoid colon.                            The polyps were 4 to 5 mm in size. These polyps                            were removed with a cold snare. Resection and                            retrieval were complete.                           Multiple small-mouthed diverticula were found in                            the descending colon and transverse colon.                           Internal hemorrhoids were found during                            retroflexion. The hemorrhoids were small. Complications:            No immediate complications. Estimated Blood Loss:     Estimated blood  loss was minimal. Impression:               - One 1 mm polyp in the cecum, removed with a cold                            snare. Resected and retrieved.                           - Two 4 to 5 mm polyps in the transverse colon,                            removed with a cold snare. Resected and retrieved.                           - One 5 mm polyp in the descending colon, removed                            with a  cold snare. Resected and retrieved.                           - Two 4 to 5 mm polyps in the sigmoid colon,                            removed with a cold snare. Resected and retrieved.                           - Diverticulosis in the descending colon and in the                            transverse colon.                           - Small internal hemorrhoids. Recommendation:           - Patient has a contact number available for                            emergencies. The signs and symptoms of potential                            delayed complications were discussed with the                            patient. Return to normal activities tomorrow.                            Written discharge instructions were provided to the                            patient.                           - Resume previous diet.                           - Continue present medications.                           - Await pathology results.                           - No recommendation at this time regarding repeat                            colonoscopy. Jerene Bears, MD 11/15/2019 12:09:36 PM This report has been signed electronically.

## 2019-11-19 ENCOUNTER — Encounter: Payer: Self-pay | Admitting: Internal Medicine

## 2019-11-19 ENCOUNTER — Telehealth: Payer: Self-pay

## 2019-11-19 NOTE — Telephone Encounter (Signed)
  Follow up Call-  Call back number 11/15/2019  Post procedure Call Back phone  # [phone]  Permission to leave phone message Yes  Some recent data might be hidden     Patient questions:  Do you have a fever, pain , or abdominal swelling? No. Pain Score  0 *  Have you tolerated food without any problems? Yes.    Have you been able to return to your normal activities? Yes.    Do you have any questions about your discharge instructions: Diet   No. Medications  No. Follow up visit  No.  Do you have questions or concerns about your Care? No.  Actions: * If pain score is 4 or above: No action needed, pain <4.  1. Have you developed a fever since your procedure? no  2.   Have you had an respiratory symptoms (SOB or cough) since your procedure? no  3.   Have you tested positive for COVID 19 since your procedure no  4.   Have you had any family members/close contacts diagnosed with the COVID 19 since your procedure?  no   If yes to any of these questions please route to Joylene John, RN and Alphonsa Gin, Therapist, sports.

## 2019-11-25 ENCOUNTER — Ambulatory Visit (INDEPENDENT_AMBULATORY_CARE_PROVIDER_SITE_OTHER): Payer: Medicare Other | Admitting: *Deleted

## 2019-11-25 ENCOUNTER — Other Ambulatory Visit: Payer: Self-pay

## 2019-11-25 DIAGNOSIS — D4101 Neoplasm of uncertain behavior of right kidney: Secondary | ICD-10-CM | POA: Diagnosis not present

## 2019-11-25 DIAGNOSIS — C641 Malignant neoplasm of right kidney, except renal pelvis: Secondary | ICD-10-CM | POA: Diagnosis not present

## 2019-11-25 DIAGNOSIS — Z5181 Encounter for therapeutic drug level monitoring: Secondary | ICD-10-CM

## 2019-11-25 DIAGNOSIS — I4891 Unspecified atrial fibrillation: Secondary | ICD-10-CM | POA: Diagnosis not present

## 2019-11-25 DIAGNOSIS — K8020 Calculus of gallbladder without cholecystitis without obstruction: Secondary | ICD-10-CM | POA: Diagnosis not present

## 2019-11-25 LAB — POCT INR: INR: 2.9 (ref 2.0–3.0)

## 2019-11-25 NOTE — Patient Instructions (Signed)
Description   Continue normal dose 1.5 tablets daily.  Recheck in 3 weeks. Call with any new medications [phone].

## 2019-12-03 ENCOUNTER — Other Ambulatory Visit: Payer: Self-pay | Admitting: Cardiovascular Disease

## 2019-12-16 ENCOUNTER — Other Ambulatory Visit: Payer: Self-pay

## 2019-12-16 ENCOUNTER — Ambulatory Visit (INDEPENDENT_AMBULATORY_CARE_PROVIDER_SITE_OTHER): Payer: Medicare Other

## 2019-12-16 DIAGNOSIS — Z5181 Encounter for therapeutic drug level monitoring: Secondary | ICD-10-CM | POA: Diagnosis not present

## 2019-12-16 DIAGNOSIS — I4891 Unspecified atrial fibrillation: Secondary | ICD-10-CM

## 2019-12-16 LAB — POCT INR: INR: 2.2 (ref 2.0–3.0)

## 2019-12-16 NOTE — Patient Instructions (Signed)
Description   Continue on same dosage 1.5 tablets daily.  Recheck in 4 weeks. Call with any new medications [phone].

## 2020-01-07 ENCOUNTER — Other Ambulatory Visit: Payer: Self-pay

## 2020-01-09 ENCOUNTER — Ambulatory Visit (INDEPENDENT_AMBULATORY_CARE_PROVIDER_SITE_OTHER): Payer: Medicare Other | Admitting: Internal Medicine

## 2020-01-09 ENCOUNTER — Encounter: Payer: Self-pay | Admitting: Internal Medicine

## 2020-01-09 VITALS — BP 138/78 | HR 70 | Temp 97.8°F | Ht 70.0 in | Wt 240.0 lb

## 2020-01-09 DIAGNOSIS — E1129 Type 2 diabetes mellitus with other diabetic kidney complication: Secondary | ICD-10-CM

## 2020-01-09 DIAGNOSIS — E1165 Type 2 diabetes mellitus with hyperglycemia: Secondary | ICD-10-CM

## 2020-01-09 DIAGNOSIS — R809 Proteinuria, unspecified: Secondary | ICD-10-CM

## 2020-01-09 DIAGNOSIS — Z794 Long term (current) use of insulin: Secondary | ICD-10-CM | POA: Diagnosis not present

## 2020-01-09 DIAGNOSIS — E1143 Type 2 diabetes mellitus with diabetic autonomic (poly)neuropathy: Secondary | ICD-10-CM | POA: Diagnosis not present

## 2020-01-09 LAB — POCT GLYCOSYLATED HEMOGLOBIN (HGB A1C): Hemoglobin A1C: 12.3 % — AB (ref 4.0–5.6)

## 2020-01-09 MED ORDER — INSULIN LISPRO PROT & LISPRO (75-25 MIX) 100 UNIT/ML KWIKPEN: 34.0000 [IU] | PEN_INJECTOR | Freq: Two times a day (BID) | SUBCUTANEOUS | 3 refills | Status: DC

## 2020-01-09 NOTE — Progress Notes (Signed)
Name: Seth [name]  Age/ Sex: 79 y.o., male   MRN/ DOB: [id number], [birth year]     PCP: Lauree Chandler, NP   Reason for Endocrinology Evaluation: Type 2 Diabetes Mellitus  Initial Endocrine Consultative Visit: 10/09/2019    PATIENT IDENTIFIER: Seth [name] is a 79 y.o. male with a past medical history of T2DM, HTN, PVD, CAD, A.Fib and Prostate Ca ( S/P chemo and radiation). The patient has followed with Endocrinology clinic since 10/09/2019 for consultative assistance with management of his diabetes.  DIABETIC HISTORY:  Seth [name] was diagnosed with T2DM many years ago.  He was prescribed Jardiance in the past, but unable to use due to cost.  hemoglobin A1c has ranged from 6.4% in 2015, peaking at 11.7% in 2020 On his initial visit to our clinic his A1c was 9.7%, he was on glipizide, Toujeo, and metformin.  We switched his Toujeo to Humalog mix, we stopped glipizide and continue Metformin.   SUBJECTIVE:   During the last visit (10/09/2019): A1c 9.7%.  We switched Toujeo to Humalog mix, will stop glipizide and continued Metformin  Today (01/09/2020): Seth [name] is here for his 12-monthfollow-up on diabetes management.  He checks his blood sugars to times daily, preprandial to breakfast and bedtime. The patient has not had hypoglycemic episodes since the last clinic visit. Otherwise, the patient has not required any recent emergency interventions for hypoglycemia and has not had recent hospitalizations secondary to hyper or hypoglycemic episodes.    ROS: As per HPI and as detailed below: Review of Systems  Respiratory: Negative for cough and shortness of breath.   Cardiovascular: Negative for chest pain and palpitations.  Gastrointestinal: Negative for diarrhea and nausea.  Genitourinary: Positive for frequency.       Occasional incontinence      HOME DIABETES REGIMEN:  Humalog Mix 26 units BID  Metformin 1000 mg BID     Statin: Yes ACE-I/ARB: Yes    GLUCOSE  LOG: BG 321-500 mg/dL     DIABETIC COMPLICATIONS: Microvascular complications:   Neuropathy   Denies: CKD, retinopathy   Last eye exam: Completed 12/2018  Macrovascular complications:   CAD, PVD  Denies: CVA   HISTORY:  Past Medical History:  Past Medical History:  Diagnosis Date  . Atrial fibrillation (HCC)    a. coumadin;  b. Amiodarone  . CAD (coronary artery disease)    a.  Lexiscan Myoview (02/14/14):  Apical, inferolateral scar; ? Mild ischemia; EF 32%.;  b.  LHC (02/21/14):  dLM 100%, LAD 100%, CFX 100%, dRCA 50%. => CABG (L-LAD, S-OM, S-PDA)  . Chronic systolic heart failure ([facility]   . CKD (chronic kidney disease)    proteinuria  . Hemorrhage of gastrointestinal tract, unspecified   . Hemorrhage of rectum and anus   . HLD (hyperlipidemia)   . HTN (hypertension)   . Impotence of organic origin   . Ischemic cardiomyopathy    a. Echo (02/17/14):  EF 35-40%, anteroseptal and apical AK, Gr 2 DD, MAC, mod LAE.  .Marland KitchenNephrolithiasis   . Peripheral vascular disease, unspecified ([facility]   . Prostate cancer ([facility]   . Thrombocytopenia, unspecified ([facility]   . Tobacco use disorder   . 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 BACK SURGERY    . CATARACT EXTRACTION  2010  . CLIPPING OF ATRIAL APPENDAGE Left 02/24/2014   Procedure: CLIPPING  OF ATRIAL APPENDAGE;  Surgeon: Gaye Pollack, MD;  Location: [facility];  Service: Open Heart Surgery;  Laterality: Left;  . COLONOSCOPY  2005   Normal  . CORONARY ARTERY BYPASS GRAFT N/A 02/24/2014   Procedure: Coronary artery bypass graft times three using left internal mammary artery and left leg greater saphenous vein harvested endoscopically.;  Surgeon: Gaye Pollack, MD;  [facility];  Service: Open Heart Surgery;  Laterality: N/A;  . INTRAOPERATIVE TRANSESOPHAGEAL ECHOCARDIOGRAM N/A 02/24/2014   Procedure:  INTRAOPERATIVE TRANSESOPHAGEAL ECHOCARDIOGRAM;  Surgeon: Gaye Pollack, MD;  Location: [facility] OR;  Service: Open Heart Surgery;  Laterality: N/A;  . LEFT HEART CATHETERIZATION WITH CORONARY ANGIOGRAM N/A 02/21/2014   Procedure: LEFT HEART CATHETERIZATION WITH CORONARY ANGIOGRAM;  Surgeon: Peter M Martinique, MD;  Location: [facility] LAB;  Service: Cardiovascular;  Laterality: N/A;    Social History:  reports that he has never smoked. He quit smokeless tobacco use about 5 years ago.  His smokeless tobacco use included chew. He reports that he does not drink alcohol or use drugs. Family History:  Family History  Problem Relation Age of Onset  . Cancer Mother 56       leukemia  . Heart disease Father   . Heart attack Father 89  . Lung cancer Brother   . Colon cancer Neg Hx   . Rectal cancer Neg Hx      HOME MEDICATIONS: Allergies as of 01/09/2020   No Known Allergies     Medication List       Accurate as of January 09, 2020 11:01 AM. If you have any questions, ask your nurse or doctor.        amLODipine 5 MG tablet Commonly known as: NORVASC Take 5 mg by mouth daily.   aspirin EC 81 MG tablet Take 1 tablet (81 mg total) by mouth daily.   atorvastatin 40 MG tablet Commonly known as: LIPITOR TAKE ONE TABLET BY MOUTH ONE TIME DAILY   Calcium Carbonate-Vitamin D 600-200 MG-UNIT Tabs Take 1 tablet by mouth daily. For bones   carvedilol 25 MG tablet Commonly known as: COREG Take 25 mg by mouth 2 (two) times daily with a meal.   FLAXSEED OIL PO Take 1 tablet by mouth daily.   furosemide 40 MG tablet Commonly known as: LASIX take 2 tablets by mouth in the morning and 1 tablet every evening. Please keep upcoming appt for future refills.   Insulin Lispro Prot & Lispro (75-25) 100 UNIT/ML Kwikpen Commonly known as: HumaLOG Mix 75/25 KwikPen Inject 34 Units into the skin 2 (two) times daily with a meal. What changed: how much to take Changed by: Dorita Sciara, MD     lisinopril 40 MG tablet Commonly known as: ZESTRIL Take 40 mg by mouth 2 (two) times daily.   metFORMIN 1000 MG tablet Commonly known as: GLUCOPHAGE TAKE ONE TABLET BY MOUTH TWICE DAILY WITH A MEAL   MULTIVITAMINS PO Take 1 tablet by mouth daily.   potassium chloride SA 20 MEQ tablet Commonly known as: KLOR-CON TAKE ONE TABLET BY MOUTH ONE TIME DAILY   warfarin 5 MG tablet Commonly known as: COUMADIN Take as directed by the anticoagulation clinic. If you are unsure how to take this medication, talk to your nurse or doctor. Original instructions: TAKE ONE AND ONE-HALF TABLETS BY MOUTH DAILY OR AS DIRECTED BY CLINIC        OBJECTIVE:   Vital Signs: BP 138/78 (BP Location: Right Arm, Patient Position: Sitting,  Cuff Size: Large)   Pulse 70   Temp 97.8 F (36.6 C)   Ht _0  (1.778 m)   Wt 240 lb (108.9 kg)   SpO2 98%   BMI 34.44 kg/m   Wt Readings from Last 3 Encounters:  01/09/20 240 lb (108.9 kg)  11/15/19 243 lb (110.2 kg)  11/06/19 243 lb 8 oz (110.5 kg)     Exam: General: Pt appears well and is in NAD  Neck: General: Supple without adenopathy. Thyroid: Thyroid size normal.  No goiter or nodules appreciated. No thyroid bruit.  Lungs: Clear with good BS bilat with no rales, rhonchi, or wheezes  Heart: RRR with normal S1 and S2 and no gallops; no murmurs; no rub  Extremities: Trace pretibial edema..  Skin: Normal texture and temperature to palpation. No rash noted. No Acanthosis nigricans/skin tags. No lipohypertrophy.  Neuro: MS is good with appropriate affect, pt is alert and Ox3       DM foot exam: 10/09/2019  The skin of the feet is intact without sores or ulcerations. The pedal pulses are 2+ on right and 2+ on left. The sensation is absent to a screening 5.07, 10 gram monofilament bilaterally   DATA REVIEWED:  Lab Results  Component Value Date   HGBA1C 12.3 (A) 01/09/2020   HGBA1C 9.7 (H) 08/27/2019   HGBA1C 9.8 (H) 05/07/2019   Lab Results   Component Value Date   MICROALBUR 34.1 11/02/2018   LDLCALC 39 11/02/2018   CREATININE 1.08 08/27/2019   Lab Results  Component Value Date   MICRALBCREAT 897 (H) 11/02/2018     Lab Results  Component Value Date   CHOL 98 11/02/2018   HDL 34 (L) 11/02/2018   LDLCALC 39 11/02/2018   TRIG 167 (H) 11/02/2018   CHOLHDL 2.9 11/02/2018         ASSESSMENT / PLAN / RECOMMENDATIONS:   1) Type 2 Diabetes Mellitus, poorly controlled, With neuropathic, micro albumin urea and macrovascular complications - Most recent A1c of 12.3%. Goal A1c <7.0%.  -Unfortunately he continues with worsening hypoglycemia, this is due to suboptimal medical management, we will increase his insulin regimen as below, patient advised to contact our office if his sugars are persistently over 200 mg/dL.  - He has been taking Humalog mix before breakfast but at bedtime, he was advised to take his Humalog mix before breakfast and before dinner. -Patient is on patient assistance program for insulin, will try and keep the cost of management to the lowest possible. -We again discussed the importance of diet and lifestyle changes in improving his glycemic control.  Plan: MEDICATIONS:  Increase Humalog mix to 34 units before breakfast and supper  Continue Metformin 1000 mg twice daily  EDUCATION / INSTRUCTIONS:  BG monitoring instructions: Patient is instructed to check his blood sugars to times a day, before breakfast and supper.  Call [place] [facility] clinic if: BG persistently < 70 or > 300. . I reviewed the Rule of 15 for the treatment of hypoglycemia in detail with the patient. Literature supplied.    F/U in 3 months   Signed electronically by: Mack Guise, MD  [facility]  [facility] [address] [place], [place] [address] Phone: [phone] FAX: [phone]   CC: Lauree Chandler, NP [place] [address] Phone:  [phone]  Fax: [phone]  Return to Endocrinology clinic as below: Future Appointments  Date Time Provider [place]  01/13/2020 10:00 AM [facility]  01/18/2020 11:00 AM [place] [facility]  05/26/2020  9:30 AM Dewaine Oats, Carlos American, NP [facility] None

## 2020-01-09 NOTE — Patient Instructions (Addendum)
-   INcrease Humalog Mix to 34 units Before Breakfast and Supper  - Continue Metformin 1 tablet with Breakfast and Supper      - Continue to check your sugar before Breakfast and Supper    - Please contact our office if your sugars are consistently over 250 mg/dL.    HOW TO TREAT LOW BLOOD SUGARS (Blood sugar LESS THAN 70 MG/DL)  Please follow the RULE OF 15 for the treatment of hypoglycemia treatment (when your (blood sugars are less than 70 mg/dL)    STEP 1: Take 15 grams of carbohydrates when your blood sugar is low, which includes:   3-4 GLUCOSE TABS  OR  3-4 OZ OF JUICE OR REGULAR SODA OR  ONE TUBE OF GLUCOSE GEL     STEP 2: RECHECK blood sugar in 15 MINUTES STEP 3: If your blood sugar is still low at the 15 minute recheck --> then, go back to STEP 1 and treat AGAIN with another 15 grams of carbohydrates.

## 2020-01-17 ENCOUNTER — Ambulatory Visit (INDEPENDENT_AMBULATORY_CARE_PROVIDER_SITE_OTHER): Payer: Medicare Other | Admitting: *Deleted

## 2020-01-17 ENCOUNTER — Other Ambulatory Visit: Payer: Self-pay

## 2020-01-17 DIAGNOSIS — I4891 Unspecified atrial fibrillation: Secondary | ICD-10-CM

## 2020-01-17 DIAGNOSIS — Z5181 Encounter for therapeutic drug level monitoring: Secondary | ICD-10-CM | POA: Diagnosis not present

## 2020-01-17 LAB — POCT INR: INR: 2.5 (ref 2.0–3.0)

## 2020-01-17 NOTE — Patient Instructions (Signed)
Description   Continue on same dosage 1.5 tablets daily.  Recheck in 5 weeks. Call with any new medications [phone].

## 2020-01-18 ENCOUNTER — Ambulatory Visit: Payer: Medicare Other

## 2020-02-11 ENCOUNTER — Other Ambulatory Visit: Payer: Self-pay | Admitting: Pharmacy Technician

## 2020-02-11 NOTE — Patient Outreach (Signed)
[facility]) Care Management  02/11/2020  Seth [name] Dec 01, 1941 [id number]     Return call placed to patient regarding patient assistance question, HIPAA compliant voicemail left.   Patient left a voicemail stating he had a question about [name] patient assistance program. Unfortunately he did not answer the phone, HIPAA compliant message left.  Follow up:  Will await a return call.  Shamikia Linskey P. Leslee Haueter, [place] Management [phone]

## 2020-02-11 NOTE — Patient Outreach (Signed)
[facility]) Care Management  02/11/2020  AVIR DERUITER 19-Apr-1941 [id number]  ADDENDUM   Incoming call from patient regarding patient assistance question, HIPAA identifiers verified. Patient called to request the phone number for [name] to reorder his Humalog 75/25. Provided patient the phone number as requested.   Renner Sebald P. Adal Sereno, [place] Management [phone]

## 2020-02-21 ENCOUNTER — Other Ambulatory Visit: Payer: Self-pay

## 2020-02-21 ENCOUNTER — Ambulatory Visit (INDEPENDENT_AMBULATORY_CARE_PROVIDER_SITE_OTHER): Payer: Medicare Other

## 2020-02-21 DIAGNOSIS — Z5181 Encounter for therapeutic drug level monitoring: Secondary | ICD-10-CM | POA: Diagnosis not present

## 2020-02-21 DIAGNOSIS — I4891 Unspecified atrial fibrillation: Secondary | ICD-10-CM | POA: Diagnosis not present

## 2020-02-21 LAB — POCT INR: INR: 2.5 (ref 2.0–3.0)

## 2020-02-21 NOTE — Patient Instructions (Signed)
Description   Continue on same dosage 1.5 tablets daily.  Recheck in 6 weeks. Call with any new medications [phone].

## 2020-03-10 ENCOUNTER — Other Ambulatory Visit: Payer: Self-pay | Admitting: Cardiovascular Disease

## 2020-04-03 ENCOUNTER — Other Ambulatory Visit: Payer: Self-pay

## 2020-04-03 ENCOUNTER — Ambulatory Visit (INDEPENDENT_AMBULATORY_CARE_PROVIDER_SITE_OTHER): Payer: Medicare Other | Admitting: *Deleted

## 2020-04-03 DIAGNOSIS — Z5181 Encounter for therapeutic drug level monitoring: Secondary | ICD-10-CM

## 2020-04-03 DIAGNOSIS — I4891 Unspecified atrial fibrillation: Secondary | ICD-10-CM | POA: Diagnosis not present

## 2020-04-03 LAB — POCT INR: INR: 1.7 — AB (ref 2.0–3.0)

## 2020-04-03 NOTE — Patient Instructions (Signed)
Description   Today take 2 tablets then continue on same dosage 1.5 tablets daily.  Recheck in 4 weeks. Call with any new medications [phone].

## 2020-04-08 ENCOUNTER — Ambulatory Visit (INDEPENDENT_AMBULATORY_CARE_PROVIDER_SITE_OTHER): Payer: Medicare Other | Admitting: Internal Medicine

## 2020-04-08 ENCOUNTER — Other Ambulatory Visit: Payer: Self-pay | Admitting: Pharmacy Technician

## 2020-04-08 ENCOUNTER — Other Ambulatory Visit: Payer: Self-pay

## 2020-04-08 VITALS — BP 152/84 | HR 61 | Temp 97.7°F | Ht 70.0 in | Wt 234.2 lb

## 2020-04-08 DIAGNOSIS — R809 Proteinuria, unspecified: Secondary | ICD-10-CM

## 2020-04-08 DIAGNOSIS — Z794 Long term (current) use of insulin: Secondary | ICD-10-CM | POA: Diagnosis not present

## 2020-04-08 DIAGNOSIS — E1129 Type 2 diabetes mellitus with other diabetic kidney complication: Secondary | ICD-10-CM | POA: Diagnosis not present

## 2020-04-08 LAB — GLUCOSE, POCT (MANUAL RESULT ENTRY): POC Glucose: 374 mg/dl — AB (ref 70–99)

## 2020-04-08 LAB — POCT GLYCOSYLATED HEMOGLOBIN (HGB A1C): Hemoglobin A1C: 12.5 % — AB (ref 4.0–5.6)

## 2020-04-08 MED ORDER — INSULIN LISPRO PROT & LISPRO (75-25 MIX) 100 UNIT/ML KWIKPEN: 50.0000 [IU] | PEN_INJECTOR | Freq: Two times a day (BID) | SUBCUTANEOUS | 3 refills | Status: DC

## 2020-04-08 NOTE — Progress Notes (Signed)
Name: Seth [name]  Age/ Sex: 79 y.o., male   MRN/ DOB: [id number], 1941/05/31     PCP: Lauree Chandler, NP   Reason for Endocrinology Evaluation: Type 2 Diabetes Mellitus  Initial Endocrine Consultative Visit: 10/09/2019    PATIENT IDENTIFIER: [name] is a 79 y.o. male with a past medical history of T2DM, HTN, PVD, CAD, A.Fib and Prostate Ca ( S/P chemo and radiation). The patient has followed with Endocrinology clinic since 10/09/2019 for consultative assistance with management of his diabetes.  DIABETIC HISTORY:  Mr. Reas was diagnosed with T2DM many years ago.  He was prescribed Jardiance in the past, but unable to use due to cost.  hemoglobin A1c has ranged from 6.4% in 2015, peaking at 11.7% in 2020 On his initial visit to our clinic his A1c was 9.7%, he was on glipizide, Toujeo, and metformin.  We switched his Toujeo to Humalog mix, we stopped glipizide and continued Metformin.  Lives with wife  SUBJECTIVE:   During the last visit (01/09/2020): A1c 9.7%.  We switched Toujeo to Humalog mix, will stop glipizide and continued Metformin  Today (04/08/2020): Mr. Hamre is here for his 31-monthfollow-up on diabetes management.  He checks his blood sugars to times daily, preprandial to breakfast and bedtime. The patient has not had hypoglycemic episodes since the last clinic visit. Otherwise, the patient has not required any recent emergency interventions for hypoglycemia and has not had recent hospitalizations secondary to hyper or hypoglycemic episodes.    ROS: As per HPI and as detailed below: Review of Systems  Respiratory: Negative for cough and shortness of breath.   Cardiovascular: Negative for chest pain and palpitations.  Gastrointestinal: Negative for diarrhea and nausea.  Genitourinary: Positive for frequency.       Occasional incontinence  Endo/Heme/Allergies: Positive for polydipsia.      HOME DIABETES REGIMEN:  Humalog Mix 34 units BID - has been  taking 40 units  Metformin 1000 mg BID     Statin: Yes ACE-I/ARB: Yes    GLUCOSE LOG:  BG > 250 mg/dL     DIABETIC COMPLICATIONS: Microvascular complications:   Neuropathy   Denies: CKD, retinopathy   Last eye exam: Completed 12/2018  Macrovascular complications:   CAD, PVD  Denies: CVA   HISTORY:  Past Medical History:  Past Medical History:  Diagnosis Date  . Atrial fibrillation (HCC)    a. coumadin;  b. Amiodarone  . CAD (coronary artery disease)    a.  Lexiscan Myoview (02/14/14):  Apical, inferolateral scar; ? Mild ischemia; EF 32%.;  b.  LHC (02/21/14):  dLM 100%, LAD 100%, CFX 100%, dRCA 50%. => CABG (L-LAD, S-OM, S-PDA)  . Chronic systolic heart failure ([facility]   . CKD (chronic kidney disease)    proteinuria  . Hemorrhage of gastrointestinal tract, unspecified   . Hemorrhage of rectum and anus   . HLD (hyperlipidemia)   . HTN (hypertension)   . Impotence of organic origin   . Ischemic cardiomyopathy    a. Echo (02/17/14):  EF 35-40%, anteroseptal and apical AK, Gr 2 DD, MAC, mod LAE.  .Marland KitchenNephrolithiasis   . Peripheral vascular disease, unspecified ([facility]   . Prostate cancer ([facility]   . Thrombocytopenia, unspecified ([facility]   . Tobacco use disorder   . 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 BACK SURGERY    .  CATARACT EXTRACTION  2010  . CLIPPING OF ATRIAL APPENDAGE Left 02/24/2014   Procedure: CLIPPING OF ATRIAL APPENDAGE;  Surgeon: Gaye Pollack, MD;  Location: [facility];  Service: Open Heart Surgery;  Laterality: Left;  . COLONOSCOPY  2005   Normal  . CORONARY ARTERY BYPASS GRAFT N/A 02/24/2014   Procedure: Coronary artery bypass graft times three using left internal mammary artery and left leg greater saphenous vein harvested endoscopically.;  Surgeon: Gaye Pollack, MD;  [facility];  Service: Open Heart Surgery;   Laterality: N/A;  . INTRAOPERATIVE TRANSESOPHAGEAL ECHOCARDIOGRAM N/A 02/24/2014   Procedure: INTRAOPERATIVE TRANSESOPHAGEAL ECHOCARDIOGRAM;  Surgeon: Gaye Pollack, MD;  Location: [facility] OR;  Service: Open Heart Surgery;  Laterality: N/A;  . LEFT HEART CATHETERIZATION WITH CORONARY ANGIOGRAM N/A 02/21/2014   Procedure: LEFT HEART CATHETERIZATION WITH CORONARY ANGIOGRAM;  Surgeon: Peter M Martinique, MD;  Location: [facility] CATH LAB;  Service: Cardiovascular;  Laterality: N/A;    Social History:  reports that he has never smoked. He quit smokeless tobacco use about 6 years ago.  His smokeless tobacco use included chew. He reports that he does not drink alcohol or use drugs. Family History:  Family History  Problem Relation Age of Onset  . Cancer Mother 104       leukemia  . Heart disease Father   . Heart attack Father 18  . Lung cancer Brother   . Colon cancer Neg Hx   . Rectal cancer Neg Hx      HOME MEDICATIONS: Allergies as of 04/08/2020   No Known Allergies     Medication List       Accurate as of April 08, 2020  1:20 PM. If you have any questions, ask your nurse or doctor.        amLODipine 5 MG tablet Commonly known as: NORVASC Take 5 mg by mouth daily.   aspirin EC 81 MG tablet Take 1 tablet (81 mg total) by mouth daily.   atorvastatin 40 MG tablet Commonly known as: LIPITOR TAKE ONE TABLET BY MOUTH ONE TIME DAILY   Calcium Carbonate-Vitamin D 600-200 MG-UNIT Tabs Take 1 tablet by mouth daily. For bones   carvedilol 25 MG tablet Commonly known as: COREG Take 25 mg by mouth 2 (two) times daily with a meal.   FLAXSEED OIL PO Take 1 tablet by mouth daily.   furosemide 40 MG tablet Commonly known as: LASIX take 2 tablets by mouth every morning and 1 tablet every evening   Insulin Lispro Prot & Lispro (75-25) 100 UNIT/ML Kwikpen Commonly known as: HumaLOG Mix 75/25 KwikPen Inject 34 Units into the skin 2 (two) times daily with a meal. What changed: how much to take     lisinopril 40 MG tablet Commonly known as: ZESTRIL Take 40 mg by mouth 2 (two) times daily.   metFORMIN 1000 MG tablet Commonly known as: GLUCOPHAGE TAKE ONE TABLET BY MOUTH TWICE DAILY WITH A MEAL   MULTIVITAMINS PO Take 1 tablet by mouth daily.   potassium chloride SA 20 MEQ tablet Commonly known as: KLOR-CON TAKE ONE TABLET BY MOUTH ONE TIME DAILY   warfarin 5 MG tablet Commonly known as: COUMADIN Take as directed by the anticoagulation clinic. If you are unsure how to take this medication, talk to your nurse or doctor. Original instructions: TAKE ONE AND ONE-HALF TABLETS BY MOUTH DAILY or as directed by clinic        OBJECTIVE:   Vital Signs: BP (!) 152/84 (BP Location: Left  Arm, Patient Position: Sitting, Cuff Size: Normal)   Pulse 61   Temp 97.7 F (36.5 C)   Ht _0  (1.778 m)   Wt 234 lb 3.2 oz (106.2 kg)   SpO2 97%   BMI 33.60 kg/m   Wt Readings from Last 3 Encounters:  04/08/20 234 lb 3.2 oz (106.2 kg)  01/09/20 240 lb (108.9 kg)  11/15/19 243 lb (110.2 kg)     Exam: General: Pt appears well and is in NAD  Lungs: Clear with good BS bilat with no rales, rhonchi, or wheezes  Heart: RRR with normal S1 and S2 and no gallops; no murmurs; no rub  Extremities:  No pretibial edema..  Neuro: MS is good with appropriate affect, pt is alert and Ox3       DM foot exam: 04/08/2020  The skin of the feet is intact without sores or ulcerations. The pedal pulses are 2+ on right and 2+ on left. The sensation is absent to a screening 5.07, 10 gram monofilament bilaterally   DATA REVIEWED:  Lab Results  Component Value Date   HGBA1C 12.5 (A) 04/08/2020   HGBA1C 12.3 (A) 01/09/2020   HGBA1C 9.7 (H) 08/27/2019   Lab Results  Component Value Date   MICROALBUR 34.1 11/02/2018   LDLCALC 39 11/02/2018   CREATININE 1.08 08/27/2019   Lab Results  Component Value Date   MICRALBCREAT 897 (H) 11/02/2018     Lab Results  Component Value Date   CHOL 98  11/02/2018   HDL 34 (L) 11/02/2018   LDLCALC 39 11/02/2018   TRIG 167 (H) 11/02/2018   CHOLHDL 2.9 11/02/2018         ASSESSMENT / PLAN / RECOMMENDATIONS:   1) Type 2 Diabetes Mellitus, poorly controlled, With neuropathic, micro albumin urea and macrovascular complications - Most recent A1c of 12.5%. Goal A1c <7.0%.  -Unfortunately he continues with worsening hyperglycemia, typically with an A1c of 12.5% pts are not taking insulin  On regular basis. Also in review of the quantities of his shipments from [facility], and with the current dose he says he has been taking he should have ran out sooner, but he assures me he has not ran out of insulin nor has he forgotten to take and I am having to go with what he tells me at this time and Increase his dose as below  - We again discussed taking his evening dose of insulin with supper time rather then bedtime.  - If this regimen is not going to work will have to switch him to basal/prandial insulin      Plan: MEDICATIONS:  Increase Humalog mix to 50 units before breakfast and supper  Continue Metformin 1000 mg twice daily  EDUCATION / INSTRUCTIONS:  BG monitoring instructions: Patient is instructed to check his blood sugars to times a day, before breakfast and supper.  Call [facility] clinic if: BG persistently < 70 or > 300. . I reviewed the Rule of 15 for the treatment of hypoglycemia in detail with the patient. Literature supplied.    F/U in 4 months   Signed electronically by: Mack Guise, MD  [facility] Endocrinology  [facility] [address] [place], [place] [address] Phone: [phone] FAX: [phone]   CC: Lauree Chandler, NP [place] [address] Phone: [phone]  Fax: [phone]  Return to Endocrinology clinic as below: Future Appointments  Date Time Provider [place]  05/01/2020  1:15 PM [facility]  05/26/2020  9:30 AM Dewaine Oats, Carlos American, NP [facility] None

## 2020-04-08 NOTE — Patient Outreach (Signed)
[facility]) Care Management  04/08/2020  Seth [name] Jan 05, 1941 [id number]   Incoming call received from [place] at Dr. Quin Hoop office in regards to patient assistance with [facility] for Humalog Mix 75/25.  [name] was inquiring about his enrollment as the office was unaware of his approval. Informed [name] that patient was approved in Oct 2020 and his end date was Oct 2021 per the representative that I spoke to in October of 2020. [name] informed the provider wanted to change his dose as his blood sugar is running high and his A1C is elevated.   Informed [name] that if the provider wanted to change his dose then a script would just need to be sent into [facility]. [name] verbalized understanding.  [name] P. Tiffeny Minchew, [place]  [phone]

## 2020-04-08 NOTE — Patient Instructions (Addendum)
-   INcrease Humalog Mix to 50 units Before Breakfast and 50 units with  Supper  - Continue Metformin 1 tablet with Breakfast and Supper       HOW TO TREAT LOW BLOOD SUGARS (Blood sugar LESS THAN 70 MG/DL)  Please follow the RULE OF 15 for the treatment of hypoglycemia treatment (when your (blood sugars are less than 70 mg/dL)    STEP 1: Take 15 grams of carbohydrates when your blood sugar is low, which includes:   3-4 GLUCOSE TABS  OR  3-4 OZ OF JUICE OR REGULAR SODA OR  ONE TUBE OF GLUCOSE GEL     STEP 2: RECHECK blood sugar in 15 MINUTES STEP 3: If your blood sugar is still low at the 15 minute recheck --> then, go back to STEP 1 and treat AGAIN with another 15 grams of carbohydrates.

## 2020-04-10 ENCOUNTER — Encounter: Payer: Self-pay | Admitting: Internal Medicine

## 2020-04-27 ENCOUNTER — Telehealth: Payer: Self-pay | Admitting: Internal Medicine

## 2020-04-27 NOTE — Telephone Encounter (Signed)
Patient's wife Pamala Hurry called re: Patient is almost out of the medication listed below as a result of the dosage increase and requests  new RX for :  Insulin Lispro Prot & Lispro (HUMALOG MIX 75/25 KWIKPEN) (75-25) 100 UNIT/ML Kwikpen  With increased dosage be sent to:  [facility] (new Patient on [employer])  Patient's wife Pamala Hurry request to be called at ph# [phone] to be informed about the above process

## 2020-04-27 NOTE — Telephone Encounter (Signed)
Spoke to wife and informed her that rx  with increase was sent to [facility] on 04/10/2020 with confirmation received so she should contact [employer].

## 2020-05-01 ENCOUNTER — Ambulatory Visit (INDEPENDENT_AMBULATORY_CARE_PROVIDER_SITE_OTHER): Payer: Medicare Other | Admitting: Pharmacist

## 2020-05-01 ENCOUNTER — Other Ambulatory Visit: Payer: Self-pay

## 2020-05-01 DIAGNOSIS — Z5181 Encounter for therapeutic drug level monitoring: Secondary | ICD-10-CM

## 2020-05-01 DIAGNOSIS — I4891 Unspecified atrial fibrillation: Secondary | ICD-10-CM | POA: Diagnosis not present

## 2020-05-01 LAB — POCT INR: INR: 2.2 (ref 2.0–3.0)

## 2020-05-01 NOTE — Patient Instructions (Signed)
Continue on same dosage 1.5 tablets daily.  Recheck in 4 weeks. Call with any new medications [phone].

## 2020-05-06 ENCOUNTER — Other Ambulatory Visit: Payer: Self-pay | Admitting: Internal Medicine

## 2020-05-06 ENCOUNTER — Other Ambulatory Visit: Payer: Self-pay | Admitting: Nurse Practitioner

## 2020-05-06 DIAGNOSIS — Z8546 Personal history of malignant neoplasm of prostate: Secondary | ICD-10-CM | POA: Diagnosis not present

## 2020-05-06 NOTE — Telephone Encounter (Signed)
Office visit/labs needed for future refills

## 2020-05-13 DIAGNOSIS — Z8546 Personal history of malignant neoplasm of prostate: Secondary | ICD-10-CM | POA: Diagnosis not present

## 2020-05-13 DIAGNOSIS — E291 Testicular hypofunction: Secondary | ICD-10-CM | POA: Diagnosis not present

## 2020-05-13 DIAGNOSIS — R3121 Asymptomatic microscopic hematuria: Secondary | ICD-10-CM | POA: Diagnosis not present

## 2020-05-26 ENCOUNTER — Encounter: Payer: Medicare Other | Admitting: Nurse Practitioner

## 2020-05-28 ENCOUNTER — Other Ambulatory Visit: Payer: Self-pay | Admitting: Nurse Practitioner

## 2020-05-29 ENCOUNTER — Other Ambulatory Visit: Payer: Self-pay

## 2020-05-29 ENCOUNTER — Ambulatory Visit (INDEPENDENT_AMBULATORY_CARE_PROVIDER_SITE_OTHER): Payer: Medicare Other

## 2020-05-29 DIAGNOSIS — Z5181 Encounter for therapeutic drug level monitoring: Secondary | ICD-10-CM

## 2020-05-29 DIAGNOSIS — I4891 Unspecified atrial fibrillation: Secondary | ICD-10-CM

## 2020-05-29 LAB — POCT INR: INR: 2.3 (ref 2.0–3.0)

## 2020-05-29 NOTE — Patient Instructions (Signed)
Description   Continue on same dosage 1.5 tablets daily.  Recheck in 5 weeks. Call with any new medications [phone].

## 2020-06-17 ENCOUNTER — Encounter: Payer: Self-pay | Admitting: Nurse Practitioner

## 2020-06-17 ENCOUNTER — Telehealth: Payer: Self-pay

## 2020-06-17 ENCOUNTER — Ambulatory Visit (INDEPENDENT_AMBULATORY_CARE_PROVIDER_SITE_OTHER): Payer: Medicare Other | Admitting: Nurse Practitioner

## 2020-06-17 ENCOUNTER — Other Ambulatory Visit: Payer: Self-pay

## 2020-06-17 VITALS — BP 160/84 | HR 64 | Ht 66.0 in | Wt 224.0 lb

## 2020-06-17 DIAGNOSIS — I25708 Atherosclerosis of coronary artery bypass graft(s), unspecified, with other forms of angina pectoris: Secondary | ICD-10-CM | POA: Diagnosis not present

## 2020-06-17 DIAGNOSIS — E1169 Type 2 diabetes mellitus with other specified complication: Secondary | ICD-10-CM | POA: Diagnosis not present

## 2020-06-17 DIAGNOSIS — E785 Hyperlipidemia, unspecified: Secondary | ICD-10-CM | POA: Diagnosis not present

## 2020-06-17 DIAGNOSIS — I739 Peripheral vascular disease, unspecified: Secondary | ICD-10-CM | POA: Diagnosis not present

## 2020-06-17 DIAGNOSIS — I10 Essential (primary) hypertension: Secondary | ICD-10-CM

## 2020-06-17 DIAGNOSIS — I5022 Chronic systolic (congestive) heart failure: Secondary | ICD-10-CM | POA: Diagnosis not present

## 2020-06-17 DIAGNOSIS — Z794 Long term (current) use of insulin: Secondary | ICD-10-CM

## 2020-06-17 DIAGNOSIS — Z1159 Encounter for screening for other viral diseases: Secondary | ICD-10-CM

## 2020-06-17 DIAGNOSIS — Z0000 Encounter for general adult medical examination without abnormal findings: Secondary | ICD-10-CM

## 2020-06-17 DIAGNOSIS — E1165 Type 2 diabetes mellitus with hyperglycemia: Secondary | ICD-10-CM | POA: Diagnosis not present

## 2020-06-17 DIAGNOSIS — D696 Thrombocytopenia, unspecified: Secondary | ICD-10-CM

## 2020-06-17 DIAGNOSIS — I4891 Unspecified atrial fibrillation: Secondary | ICD-10-CM

## 2020-06-17 DIAGNOSIS — Z8546 Personal history of malignant neoplasm of prostate: Secondary | ICD-10-CM

## 2020-06-17 DIAGNOSIS — Z6836 Body mass index (BMI) 36.0-36.9, adult: Secondary | ICD-10-CM

## 2020-06-17 DIAGNOSIS — R195 Other fecal abnormalities: Secondary | ICD-10-CM | POA: Diagnosis not present

## 2020-06-17 MED ORDER — AMLODIPINE BESYLATE 10 MG PO TABS: 10.0000 mg | ORAL_TABLET | Freq: Every day | ORAL | 1 refills | Status: DC

## 2020-06-17 NOTE — Patient Instructions (Signed)
Increase amlodipine (norvasc) to 10 mg daily due to elevated blood pressure Blood pressure should be <140/90   DASH Eating Plan DASH stands for "Dietary Approaches to Stop Hypertension." The DASH eating plan is a healthy eating plan that has been shown to reduce high blood pressure (hypertension). It may also reduce your risk for type 2 diabetes, heart disease, and stroke. The DASH eating plan may also help with weight loss. What are tips for following this plan?  General guidelines  Avoid eating more than 2,300 mg (milligrams) of salt (sodium) a day. If you have hypertension, you may need to reduce your sodium intake to 1,500 mg a day.  Limit alcohol intake to no more than 1 drink a day for nonpregnant women and 2 drinks a day for men. One drink equals 12 oz of beer, 5 oz of wine, or 1 oz of hard liquor.  Work with your health care provider to maintain a healthy body weight or to lose weight. Ask what an ideal weight is for you.  Get at least 30 minutes of exercise that causes your heart to beat faster (aerobic exercise) most days of the week. Activities may include walking, swimming, or biking.  Work with your health care provider or diet and nutrition specialist (dietitian) to adjust your eating plan to your individual calorie needs. Reading food labels   Check food labels for the amount of sodium per serving. Choose foods with less than 5 percent of the Daily Value of sodium. Generally, foods with less than 300 mg of sodium per serving fit into this eating plan.  To find whole grains, look for the word "whole" as the first word in the ingredient list. Shopping  Buy products labeled as "low-sodium" or "no salt added."  Buy fresh foods. Avoid canned foods and premade or frozen meals. Cooking  Avoid adding salt when cooking. Use salt-free seasonings or herbs instead of table salt or sea salt. Check with your health care provider or pharmacist before using salt substitutes.  Do not  fry foods. Cook foods using healthy methods such as baking, boiling, grilling, and broiling instead.  Cook with heart-healthy oils, such as olive, canola, soybean, or sunflower oil. Meal planning  Eat a balanced diet that includes: ? 5 or more servings of fruits and vegetables each day. At each meal, try to fill half of your plate with fruits and vegetables. ? Up to 6-8 servings of whole grains each day. ? Less than 6 oz of lean meat, poultry, or fish each day. A 3-oz serving of meat is about the same size as a deck of cards. One egg equals 1 oz. ? 2 servings of low-fat dairy each day. ? A serving of nuts, seeds, or beans 5 times each week. ? Heart-healthy fats. Healthy fats called Omega-3 fatty acids are found in foods such as flaxseeds and coldwater fish, like sardines, salmon, and mackerel.  Limit how much you eat of the following: ? Canned or prepackaged foods. ? Food that is high in trans fat, such as fried foods. ? Food that is high in saturated fat, such as fatty meat. ? Sweets, desserts, sugary drinks, and other foods with added sugar. ? Full-fat dairy products.  Do not salt foods before eating.  Try to eat at least 2 vegetarian meals each week.  Eat more home-cooked food and less restaurant, buffet, and fast food.  When eating at a restaurant, ask that your food be prepared with less salt or no salt, if  possible. What foods are recommended? The items listed may not be a complete list. Talk with your dietitian about what dietary choices are best for you. Grains Whole-grain or whole-wheat bread. Whole-grain or whole-wheat pasta. Brown rice. [place]row. Bulgur. Whole-grain and low-sodium cereals. Pita bread. Low-fat, low-sodium crackers. Whole-wheat flour tortillas. Vegetables Fresh or frozen vegetables (raw, steamed, roasted, or grilled). Low-sodium or reduced-sodium tomato and vegetable juice. Low-sodium or reduced-sodium tomato sauce and tomato paste. Low-sodium or  reduced-sodium canned vegetables. Fruits All fresh, dried, or frozen fruit. Canned fruit in natural juice (without added sugar). Meat and other protein foods Skinless chicken or [place]. Ground chicken or [place]. Pork with fat trimmed off. Fish and seafood. Egg whites. Dried beans, peas, or lentils. Unsalted nuts, nut butters, and seeds. Unsalted canned beans. Lean cuts of beef with fat trimmed off. Low-sodium, lean deli meat. Dairy Low-fat (1%) or fat-free (skim) milk. Fat-free, low-fat, or reduced-fat cheeses. Nonfat, low-sodium ricotta or cottage cheese. Low-fat or nonfat yogurt. Low-fat, low-sodium cheese. Fats and oils Soft margarine without trans fats. Vegetable oil. Low-fat, reduced-fat, or light mayonnaise and salad dressings (reduced-sodium). Canola, safflower, olive, soybean, and sunflower oils. Avocado. Seasoning and other foods Herbs. Spices. Seasoning mixes without salt. Unsalted popcorn and pretzels. Fat-free sweets. What foods are not recommended? The items listed may not be a complete list. Talk with your dietitian about what dietary choices are best for you. Grains Baked goods made with fat, such as croissants, muffins, or some breads. Dry pasta or rice meal packs. Vegetables Creamed or fried vegetables. Vegetables in a cheese sauce. Regular canned vegetables (not low-sodium or reduced-sodium). Regular canned tomato sauce and paste (not low-sodium or reduced-sodium). Regular tomato and vegetable juice (not low-sodium or reduced-sodium). Angie Fava. Olives. Fruits Canned fruit in a light or heavy syrup. Fried fruit. Fruit in cream or butter sauce. Meat and other protein foods Fatty cuts of meat. Ribs. Fried meat. Berniece Salines. Sausage. Bologna and other processed lunch meats. Salami. Fatback. Hotdogs. Bratwurst. Salted nuts and seeds. Canned beans with added salt. Canned or smoked fish. Whole eggs or egg yolks. Chicken or [place] with skin. Dairy Whole or 2% milk, cream, and half-and-half.  Whole or full-fat cream cheese. Whole-fat or sweetened yogurt. Full-fat cheese. Nondairy creamers. Whipped toppings. Processed cheese and cheese spreads. Fats and oils Butter. Stick margarine. Lard. Shortening. Ghee. Bacon fat. Tropical oils, such as coconut, palm kernel, or palm oil. Seasoning and other foods Salted popcorn and pretzels. Onion salt, garlic salt, seasoned salt, table salt, and sea salt. Worcestershire sauce. Tartar sauce. Barbecue sauce. Teriyaki sauce. Soy sauce, including reduced-sodium. Steak sauce. Canned and packaged gravies. Fish sauce. Oyster sauce. Cocktail sauce. Horseradish that you find on the shelf. Ketchup. Mustard. Meat flavorings and tenderizers. Bouillon cubes. Hot sauce and Tabasco sauce. Premade or packaged marinades. Premade or packaged taco seasonings. Relishes. Regular salad dressings. Where to find more information:  National Heart, Lung, and [place]: https://wilson-eaton.com/  American Heart Association: www.heart.org Summary  The DASH eating plan is a healthy eating plan that has been shown to reduce high blood pressure (hypertension). It may also reduce your risk for type 2 diabetes, heart disease, and stroke.  With the DASH eating plan, you should limit salt (sodium) intake to 2,300 mg a day. If you have hypertension, you may need to reduce your sodium intake to 1,500 mg a day.  When on the DASH eating plan, aim to eat more fresh fruits and vegetables, whole grains, lean proteins, low-fat dairy, and heart-healthy fats.  Work with your health care provider or diet and nutrition specialist (dietitian) to adjust your eating plan to your individual calorie needs. This information is not intended to replace advice given to you by your health care provider. Make sure you discuss any questions you have with your health care provider. Document Revised: 11/24/2017 Document Reviewed: 12/05/2016 Elsevier Patient Education  2020 [employer].

## 2020-06-17 NOTE — Progress Notes (Signed)
Subjective:   Seth [name] is a 79 y.o. male who presents for Medicare Annual/Subsequent preventive examination.  Review of Systems     Cardiac Risk Factors include: diabetes mellitus;hypertension;dyslipidemia;male gender;advanced age (>70mn, >>27women);family history of premature cardiovascular disease;obesity (BMI >30kg/m2)     Objective:    There were no vitals filed for this visit. There is no height or weight on file to calculate BMI.  Advanced Directives 06/17/2020 10/11/2019 08/30/2019 06/11/2019 05/23/2019 05/09/2019 11/08/2018  Does Patient Have a Medical Advance Directive? _0  Yes No  Type of AParamedicof AExcelLiving will HChatt[place]Living will [place]Living will HValley FallsLiving will [place]Living will HAngel FireLiving will -  Does patient want to make changes to medical advance directive? No - Guardian declined No - Patient declined No - Patient declined No - Patient declined No - Patient declined No - Patient declined -  Copy of [place] Chart? No - copy requested No - copy requested No - copy requested No - copy requested No - copy requested No - copy requested -  Would patient like information on creating a medical advance directive? - - - - - - -  Pre-existing out of facility DNR order (yellow form or pink MOST form) - - - - - - -    Current Medications (verified) Outpatient Encounter Medications as of 06/17/2020  Medication Sig  . amLODipine (NORVASC) 5 MG tablet Take 5 mg by mouth daily.  .Marland Kitchenaspirin EC 81 MG tablet Take 1 tablet (81 mg total) by mouth daily.  .Marland Kitchenatorvastatin (LIPITOR) 40 MG tablet TAKE ONE TABLET BY MOUTH ONE TIME DAILY   . Calcium Carbonate-Vitamin D 600-200 MG-UNIT TABS Take 1 tablet by mouth daily. For bones   . carvedilol (COREG) 25 MG tablet Take 25 mg by mouth 2 (two) times daily  with a meal.  . Flaxseed, Linseed, (FLAXSEED OIL PO) Take 1 tablet by mouth daily.   . furosemide (LASIX) 40 MG tablet take 2 tablets by mouth every morning and 1 tablet every evening  . Insulin Lispro Prot & Lispro (HUMALOG MIX 75/25 KWIKPEN) (75-25) 100 UNIT/ML Kwikpen Inject 50 Units into the skin 2 (two) times daily with a meal.  . lisinopril (PRINIVIL,ZESTRIL) 40 MG tablet Take 40 mg by mouth 2 (two) times daily.  . metFORMIN (GLUCOPHAGE) 1000 MG tablet TAKE ONE TABLET BY MOUTH TWICE DAILY WITH MEAL  . Multiple Vitamin (MULTIVITAMINS PO) Take 1 tablet by mouth daily.    . potassium chloride SA (KLOR-CON) 20 MEQ tablet TAKE ONE TABLET BY MOUTH ONE TIME DAILY   . warfarin (COUMADIN) 5 MG tablet TAKE ONE AND ONE-HALF TABLETS BY MOUTH DAILY or as directed by clinic   No facility-administered encounter medications on file as of 06/17/2020.    Allergies (verified) Patient has no known allergies.   History: Past Medical History:  Diagnosis Date  . Atrial fibrillation (HCC)    a. coumadin;  b. Amiodarone  . CAD (coronary artery disease)    a.  Lexiscan Myoview (02/14/14):  Apical, inferolateral scar; ? Mild ischemia; EF 32%.;  b.  LHC (02/21/14):  dLM 100%, LAD 100%, CFX 100%, dRCA 50%. => CABG (L-LAD, S-OM, S-PDA)  . Chronic systolic heart failure ([place]   . CKD (chronic kidney disease)    proteinuria  . Hemorrhage of gastrointestinal tract, unspecified   . Hemorrhage of rectum and  anus   . HLD (hyperlipidemia)   . HTN (hypertension)   . Impotence of organic origin   . Ischemic cardiomyopathy    a. Echo (02/17/14):  EF 35-40%, anteroseptal and apical AK, Gr 2 DD, MAC, mod LAE.  [name] Nephrolithiasis   . Peripheral vascular disease, unspecified ([place])   . Prostate cancer ([place])   . Thrombocytopenia, unspecified ([place])   . Tobacco use disorder   . Type II or unspecified type diabetes mellitus with renal manifestations, uncontrolled(250.42)    Past Surgical History:  Procedure Laterality Date   . BACK SURGERY    . CATARACT EXTRACTION  2010  . CLIPPING OF ATRIAL APPENDAGE Left 02/24/2014   Procedure: CLIPPING OF ATRIAL APPENDAGE;  Surgeon: Gaye Pollack, MD;  Location: [facility];  Service: Open Heart Surgery;  Laterality: Left;  . COLONOSCOPY  2005   Normal  . CORONARY ARTERY BYPASS GRAFT N/A 02/24/2014   Procedure: Coronary artery bypass graft times three using left internal mammary artery and left leg greater saphenous vein harvested endoscopically.;  Surgeon: Gaye Pollack, MD;  Location: [facility];  Service: Open Heart Surgery;  Laterality: N/A;  . INTRAOPERATIVE TRANSESOPHAGEAL ECHOCARDIOGRAM N/A 02/24/2014   Procedure: INTRAOPERATIVE TRANSESOPHAGEAL ECHOCARDIOGRAM;  Surgeon: Gaye Pollack, MD;  Location: [facility];  Service: Open Heart Surgery;  Laterality: N/A;  . LEFT HEART CATHETERIZATION WITH CORONARY ANGIOGRAM N/A 02/21/2014   Procedure: LEFT HEART CATHETERIZATION WITH CORONARY ANGIOGRAM;  Surgeon: Peter M Martinique, MD;  Location: [facility] CATH LAB;  Service: Cardiovascular;  Laterality: N/A;   Family History  Problem Relation Age of Onset  . Cancer Mother 48       leukemia  . Heart disease Father   . Heart attack Father 36  . Lung cancer Brother   . Colon cancer Neg Hx   . Rectal cancer Neg Hx    Social History   Socioeconomic History  . Marital status: Married    Spouse name: Not on file  . Number of children: 3  . Years of education: Not on file  . Highest education level: Not on file  Occupational History  . Occupation: Manufacturing engineer buildings  Tobacco Use  . Smoking status: Never Smoker  . Smokeless tobacco: Former Systems developer    Types: Secondary school teacher  . Vaping Use: Never used  Substance and Sexual Activity  . Alcohol use: No  . Drug use: No  . Sexual activity: Never  Other Topics Concern  . Not on file  Social History Narrative  . Not on file   Social Determinants of Health   Financial Resource Strain:   . Difficulty of Paying Living Expenses:   Food Insecurity:   .  Worried About Charity fundraiser in the Last Year:   . Arboriculturist in the Last Year:   Transportation Needs:   . Film/video editor (Medical):   [name] Lack of Transportation (Non-Medical):   Physical Activity:   . Days of Exercise per Week:   . Minutes of Exercise per Session:   Stress:   . Feeling of Stress :   Social Connections:   . Frequency of Communication with Friends and Family:   . Frequency of Social Gatherings with Friends and Family:   . Attends Religious Services:   . Active Member of Clubs or Organizations:   . Attends Archivist Meetings:   [name] Marital Status:     Tobacco Counseling Counseling given: Not Answered   Clinical Intake:  Pre-visit preparation completed: Yes  Pain : No/denies pain     BMI - recorded: 33.6 Nutritional Status: BMI > 30  Obese Nutritional Risks: Unintentional weight gain Diabetes: Yes  How often do you need to have someone help you when you read instructions, pamphlets, or other written materials from your doctor or pharmacy?: 3 - Sometimes  Diabetic? yes         Activities of Daily Living In your present state of health, do you have any difficulty performing the following activities: 06/17/2020  Hearing? N  Vision? N  Difficulty concentrating or making decisions? N  Walking or climbing stairs? N  Dressing or bathing? N  Doing errands, shopping? N  Preparing Food and eating ? N  Using the Toilet? N  In the past six months, have you accidently leaked urine? N  Do you have problems with loss of bowel control? N  Managing your Medications? N  Managing your Finances? N  Housekeeping or managing your Housekeeping? N  Some recent data might be hidden    Patient Care Team: Lauree Chandler, NP as PCP - General (Geriatric Medicine) [name], MD as PCP - Cardiology (Cardiology) [name], MD as Attending Physician (Urology) [name], MD as Consulting Physician  (Nephrology) [name], OD (Optometry)  Indicate any recent Medical Services you may have received from other than [facility] providers in the past year (date may be approximate).     Assessment:   This is a routine wellness examination for Daley.  Hearing/Vision screen No exam data present  Dietary issues and exercise activities discussed: Current Exercise Habits: The patient does not participate in regular exercise at present  Goals    . Weight (lb) < 240 lb (108.9 kg)     Starting 12/16/16, I will attempt to decrease my weight to 240lbs.       Depression Screen PHQ 2/9 Scores 06/11/2019 05/23/2019 05/09/2019 02/08/2019 02/06/2018 12/16/2016 09/16/2016  PHQ - 2 Score 0 0 0 0 0 0 0    Fall Risk Fall Risk  10/11/2019 08/30/2019 05/23/2019 05/09/2019 02/08/2019  Falls in the past year? 0 0 0 0 0  Comment - - - - -  Number falls in past yr: 0 0 0 0 0  Injury with Fall? 0 0 0 0 0  Risk for fall due to : - - - - -  Risk for fall due to: Comment - - - - -    Any stairs in or around the home? Yes  If so, are there any without handrails? Yes  Home free of loose throw rugs in walkways, pet beds, electrical cords, etc? No  Adequate lighting in your home to reduce risk of falls? Yes   ASSISTIVE DEVICES UTILIZED TO PREVENT FALLS:  Life alert? No  Use of a cane, walker or w/c? No  Grab bars in the bathroom? Yes  Shower chair or bench in shower? Yes  Elevated toilet seat or a handicapped toilet? No   TIMED UP AND GO: na Cognitive Function: MMSE - Mini Mental State Exam 05/23/2019 12/16/2016  Orientation to time 5 5  Orientation to Place 5 5  Registration 3 3  Attention/ Calculation 5 5  Recall 3 1  Language- name 2 objects 2 2  Language- repeat 1 1  Language- follow 3 step command 3 3  Language- read & follow direction 1 1  Write a sentence 1 1  Copy design 1 1  Total score 30 28  6CIT Screen 06/17/2020  What Year? 0 points  What month? 0 points  What time? 0 points   Count back from 20 0 points  Months in reverse 0 points  Repeat phrase 0 points  Total Score 0    Immunizations Immunization History  Administered Date(s) Administered  . Fluad Quad(high Dose 65+) 08/30/2019  . Influenza, High Dose Seasonal PF 11/08/2018  . Influenza,inj,Quad PF,6+ Mos 08/27/2013, 09/30/2014, 10/02/2015, 09/16/2016, 10/27/2017  . Influenza-Unspecified 09/25/2012  . Pneumococcal Conjugate-13 06/10/2016  . Pneumococcal Polysaccharide-23 12/09/2010  . Td 12/26/2010    TDAP status: Due, Education has been provided regarding the importance of this vaccine. Advised may receive this vaccine at local pharmacy or Health Dept. Aware to provide a copy of the vaccination record if obtained from local pharmacy or Health Dept. Verbalized acceptance and understanding. Flu Vaccine status: Up to date Pneumococcal vaccine status: Up to date Covid-19 vaccine status: Information provided on how to obtain vaccines.  Completed both COVID vaccines  Qualifies for Shingles Vaccine? no    Screening Tests Health Maintenance  Topic Date Due  . Hepatitis C Screening  Never done  . COVID-19 Vaccine (1) Never done  . OPHTHALMOLOGY EXAM  05/07/2020  . INFLUENZA VACCINE  07/26/2020  . HEMOGLOBIN A1C  10/08/2020  . FOOT EXAM  10/10/2020  . TETANUS/TDAP  12/26/2020  . PNA vac Low Risk Adult  Completed    Health Maintenance  Health Maintenance Due  Topic Date Due  . Hepatitis C Screening  Never done  . COVID-19 Vaccine (1) Never done  . OPHTHALMOLOGY EXAM  05/07/2020    Colorectal cancer screening: No longer required.   Lung Cancer Screening: (Low Dose CT Chest recommended if Age 74-80 years, 30 pack-year currently smoking OR have quit w/in 15years.) does not qualify.   Lung Cancer Screening Referral: na  Additional Screening:  Hepatitis C Screening: does qualify will get with next blood work  Vision Screening: Recommended annual ophthalmology exams for early detection of  glaucoma and other disorders of the eye. Is the patient up to date with their annual eye exam?  No  Who is the provider or what is the name of the office in which the patient attends annual eye exams? Unknown of name If pt is not established with a provider, would they like to be referred to a provider to establish care? No .   Dental Screening: Recommended annual dental exams for proper oral hygiene  Community Resource Referral / Chronic Care Management: CRR required this visit?  No   CCM required this visit?  No      Plan:     I have personally reviewed and noted the following in the patient's chart:   . Medical and social history . Use of alcohol, tobacco or illicit drugs  . Current medications and supplements . Functional ability and status . Nutritional status . Physical activity . Advanced directives . List of other physicians . Hospitalizations, surgeries, and ER visits in previous 12 months . Vitals . Screenings to include cognitive, depression, and falls . Referrals and appointments  In addition, I have reviewed and discussed with patient certain preventive protocols, quality metrics, and best practice recommendations. A written personalized care plan for preventive services as well as general preventive health recommendations were provided to patient.     Lauree Chandler, NP   06/17/2020

## 2020-06-17 NOTE — Telephone Encounter (Signed)
Mr. windle, huebert are scheduled for a virtual visit with your provider today.    Just as we do with appointments in the office, we must obtain your consent to participate.  Your consent will be active for this visit and any virtual visit you may have with one of our providers in the next 365 days.    If you have a MyChart account, I can also send a copy of this consent to you electronically.  All virtual visits are billed to your insurance company just like a traditional visit in the office.  As this is a virtual visit, video technology does not allow for your provider to perform a traditional examination.  This may limit your provider's ability to fully assess your condition.  If your provider identifies any concerns that need to be evaluated in person or the need to arrange testing such as labs, EKG, etc, we will make arrangements to do so.    Although advances in technology are sophisticated, we cannot ensure that it will always work on either your end or our end.  If the connection with a video visit is poor, we may have to switch to a telephone visit.  With either a video or telephone visit, we are not always able to ensure that we have a secure connection.   I need to obtain your verbal consent now.   Are you willing to proceed with your visit today?   SHAQUELLE HERNON has provided verbal consent on 06/17/2020 for a virtual visit (video or telephone).  Yes    Tanna Savoy, [facility] 06/17/2020  9:57 AM

## 2020-06-17 NOTE — Progress Notes (Signed)
Careteam: Patient Care Team: Lauree Chandler, NP as PCP - General (Geriatric Medicine) [name], MD as PCP - Cardiology (Cardiology) [name], MD as Attending Physician (Urology) [name], MD as Consulting Physician (Nephrology) [name], OD (Optometry)  PLACE OF SERVICE:  [facility] Directive information Does Patient Have a Medical Advance Directive?: Yes, Does patient want to make changes to medical advance directive?: No - Patient declined  No Known Allergies  Chief Complaint  Patient presents with  . Medical Management of Chronic Issues    follow up visit      HPI: Patient is a 79 y.o. male routine follow up in office. Pt with hx of T2DM, HTN, PVD, CAV, a fib, prostate cancer.  He is now following with endocrinology for diabetes, reports that he was not injecting his insulin like he thought and therefore not getting any medication. Recently figured this out. Reports he has been back on medication for about a month, avg blood sugar ~150 fasting. Going back next month to endocrine to get a1c rechecked.   Prostate cancer- s/p chemo and radiation- continues to follow up with urologist, checking PSA every 6 month  Positive cologuard- took off polyps during colonoscopy found to be precancerous- follow up left up to pt due to age.   He has retired up to his mtn house. Different pace.   A fib- Continues on coumadin for anticoagulation and tolerating well. Been on coumadin 7.5 1.5 tablets for a long time. No signs of bleeding or bruising. No increase in shortness of breath, chest pains.   sbp at home ranges from 130-150  Review of Systems:  Review of Systems  Constitutional: Negative for chills, fever and weight loss.  HENT: Negative for hearing loss and tinnitus.   Eyes: Negative for blurred vision.  Respiratory: Negative for cough, sputum production and shortness of breath.   Cardiovascular: Negative for chest pain,  palpitations and leg swelling.  Gastrointestinal: Negative for abdominal pain, constipation, diarrhea and heartburn.  Genitourinary: Negative for dysuria, frequency and urgency.  Musculoskeletal: Negative for back pain, falls, joint pain and myalgias.  Skin: Negative.   Neurological: Positive for tingling (bilateral LE). Negative for dizziness and headaches.  Psychiatric/Behavioral: Negative for depression and memory loss. The patient does not have insomnia.     Past Medical History:  Diagnosis Date  . Atrial fibrillation (HCC)    a. coumadin;  b. Amiodarone  . CAD (coronary artery disease)    a.  Lexiscan Myoview (02/14/14):  Apical, inferolateral scar; ? Mild ischemia; EF 32%.;  b.  LHC (02/21/14):  dLM 100%, LAD 100%, CFX 100%, dRCA 50%. => CABG (L-LAD, S-OM, S-PDA)  . Chronic systolic heart failure ([place])   . CKD (chronic kidney disease)    proteinuria  . Hemorrhage of gastrointestinal tract, unspecified   . Hemorrhage of rectum and anus   . HLD (hyperlipidemia)   . HTN (hypertension)   . Impotence of organic origin   . Ischemic cardiomyopathy    a. Echo (02/17/14):  EF 35-40%, anteroseptal and apical AK, Gr 2 DD, MAC, mod LAE.  [name] Nephrolithiasis   . Peripheral vascular disease, unspecified ([place])   . Prostate cancer ([place])   . Thrombocytopenia, unspecified ([place])   . Tobacco use disorder   . Type II or unspecified type diabetes mellitus with renal manifestations, uncontrolled(250.42)    Past Surgical History:  Procedure Laterality Date  . BACK SURGERY    . CATARACT EXTRACTION  2010  .  CLIPPING OF ATRIAL APPENDAGE Left 02/24/2014   Procedure: CLIPPING OF ATRIAL APPENDAGE;  Surgeon: Gaye Pollack, MD;  Location: [facility];  Service: Open Heart Surgery;  Laterality: Left;  . COLONOSCOPY  2005   Normal  . CORONARY ARTERY BYPASS GRAFT N/A 02/24/2014   Procedure: Coronary artery bypass graft times three using left internal mammary artery and left leg greater saphenous vein harvested  endoscopically.;  Surgeon: Gaye Pollack, MD;  Location: [facility];  Service: Open Heart Surgery;  Laterality: N/A;  . INTRAOPERATIVE TRANSESOPHAGEAL ECHOCARDIOGRAM N/A 02/24/2014   Procedure: INTRAOPERATIVE TRANSESOPHAGEAL ECHOCARDIOGRAM;  Surgeon: Gaye Pollack, MD;  Location: [facility] OR;  Service: Open Heart Surgery;  Laterality: N/A;  . LEFT HEART CATHETERIZATION WITH CORONARY ANGIOGRAM N/A 02/21/2014   Procedure: LEFT HEART CATHETERIZATION WITH CORONARY ANGIOGRAM;  Surgeon: Peter M Martinique, MD;  Location: [facility] CATH LAB;  Service: Cardiovascular;  Laterality: N/A;   Social History:   reports that he has never smoked. He quit smokeless tobacco use about 6 years ago.  His smokeless tobacco use included chew. He reports that he does not drink alcohol and does not use drugs.  Family History  Problem Relation Age of Onset  . Cancer Mother 37       leukemia  . Heart disease Father   . Heart attack Father 74  . Lung cancer Brother   . Colon cancer Neg Hx   . Rectal cancer Neg Hx     Medications: Patient's Medications  New Prescriptions   No medications on file  Previous Medications   AMLODIPINE (NORVASC) 5 MG TABLET    Take 5 mg by mouth daily.   ASPIRIN EC 81 MG TABLET    Take 1 tablet (81 mg total) by mouth daily.   ATORVASTATIN (LIPITOR) 40 MG TABLET    TAKE ONE TABLET BY MOUTH ONE TIME DAILY    CALCIUM CARBONATE-VITAMIN D 600-200 MG-UNIT TABS    Take 1 tablet by mouth daily. For bones    CARVEDILOL (COREG) 25 MG TABLET    Take 25 mg by mouth 2 (two) times daily with a meal.   FLAXSEED, LINSEED, (FLAXSEED OIL PO)    Take 1 tablet by mouth daily.    FUROSEMIDE (LASIX) 40 MG TABLET    take 2 tablets by mouth every morning and 1 tablet every evening   INSULIN LISPRO PROT & LISPRO (HUMALOG MIX 75/25 KWIKPEN) (75-25) 100 UNIT/ML KWIKPEN    Inject 50 Units into the skin 2 (two) times daily with a meal.   LISINOPRIL (PRINIVIL,ZESTRIL) 40 MG TABLET    Take 40 mg by mouth 2 (two) times daily.   METFORMIN  (GLUCOPHAGE) 1000 MG TABLET    TAKE ONE TABLET BY MOUTH TWICE DAILY WITH MEAL   MULTIPLE VITAMIN (MULTIVITAMINS PO)    Take 1 tablet by mouth daily.     POTASSIUM CHLORIDE SA (KLOR-CON) 20 MEQ TABLET    TAKE ONE TABLET BY MOUTH ONE TIME DAILY    WARFARIN (COUMADIN) 5 MG TABLET    TAKE ONE AND ONE-HALF TABLETS BY MOUTH DAILY or as directed by clinic  Modified Medications   No medications on file  Discontinued Medications   No medications on file    Physical Exam:  Vitals:   06/17/20 1306  BP: (!) 160/84  Pulse: 64  SpO2: 97%  Weight: 224 lb (101.6 kg)  Height: _0  (1.676 m)   Body mass index is 36.15 kg/m. Wt Readings from Last 3 Encounters:  06/17/20  224 lb (101.6 kg)  04/08/20 234 lb 3.2 oz (106.2 kg)  01/09/20 240 lb (108.9 kg)    Physical Exam Constitutional:      General: He is not in acute distress.    Appearance: He is well-developed. He is not diaphoretic.  HENT:     Head: Normocephalic and atraumatic.  Eyes:     Conjunctiva/sclera: Conjunctivae normal.     Pupils: Pupils are equal, round, and reactive to light.  Cardiovascular:     Rate and Rhythm: Normal rate and regular rhythm.     Heart sounds: Normal heart sounds.  Pulmonary:     Effort: Pulmonary effort is normal.     Breath sounds: Normal breath sounds.  Abdominal:     General: Bowel sounds are normal.     Palpations: Abdomen is soft.  Musculoskeletal:        General: No tenderness.     Cervical back: Normal range of motion and neck supple.     Right lower leg: No edema.     Left lower leg: No edema.  Skin:    General: Skin is warm and dry.  Neurological:     Mental Status: He is alert and oriented to person, place, and time.  Psychiatric:        Mood and Affect: Mood normal.        Behavior: Behavior normal.     Labs reviewed: Basic Metabolic Panel: Recent Labs    08/27/19 0853  NA 142  K 4.5  CL 106  CO2 31  GLUCOSE 267*  BUN 17  CREATININE 1.08  CALCIUM 9.1   Liver Function  Tests: No results for input(s): AST, ALT, ALKPHOS, BILITOT, PROT, ALBUMIN in the last 8760 hours. No results for input(s): LIPASE, AMYLASE in the last 8760 hours. No results for input(s): AMMONIA in the last 8760 hours. CBC: No results for input(s): WBC, NEUTROABS, HGB, HCT, MCV, PLT in the last 8760 hours. Lipid Panel: No results for input(s): CHOL, HDL, LDLCALC, TRIG, CHOLHDL, LDLDIRECT in the last 8760 hours. TSH: No results for input(s): TSH in the last 8760 hours. A1C: Lab Results  Component Value Date   HGBA1C 12.5 (A) 04/08/2020     Assessment/Plan 1. Type 2 diabetes mellitus with hyperglycemia, with long-term current use of insulin ([place]) -figured out he was not administering his insulin and has made changes, reports blood sugars have improved, no hypoglycemia, has follow up scheduled with endocrinologist scheduled. Encouraged dietary compliance, routine foot care/monitoring and to keep up with diabetic eye exams through ophthalmology   2. Positive colorectal cancer screening using Cologuard test Went for colonoscopy, polyps removed were precancerous, due to age pt does not wish to have another screening.   3. Atrial fibrillation, unspecified type ([place]) Rate controlled, continues on coumadin for anticoagulation, followed by coumadin clinic, no abnormal bruising or bleeding noted. Rate controlled on coreg - CBC with Differential/Platelet  4. Chronic systolic congestive heart failure (HCC) Stable, euvolemic at this time. Continues on lasix, coreg, lisinopril  - COMPLETE METABOLIC PANEL WITH GFR  5. PVD (peripheral vascular disease) (HCC) -stable at this time. Continues to follow up with cardiology, on asa and coumdain. - CBC with Differential/Platelet  6. Hyperlipidemia associated with type 2 diabetes mellitus ([place]) Continues on lipitor. Diet modifications encouraged - Lipid panel - COMPLETE METABOLIC PANEL WITH GFR  7. Need for hepatitis C screening test - Hepatitis C  antibody  8. Class 2 severe obesity due to excess calories with serious comorbidity and  body mass index (BMI) of 36.0 to 36.9 in adult [facility]) -discussed dietary modifications and increase in physical activity to promote healthy weight loss and health  9. Thrombocytopenia ([place]) -will follow up CBC  10. History of prostate cancer Completed chemo and radiation, PSA being monitored by urology.   11. Coronary artery disease of bypass graft of native heart with stable angina pectoris (HCC) Stable, without chest pains at this time. Continues on lipitor, coreg, asa  12. Hypertension, benign -elevated at home and at office, discussed dietary modifications, DASH diet. Will also increase amlodipine to 10 mg daily. Continues on coreg, lisinopril - amLODipine (NORVASC) 10 MG tablet; Take 1 tablet (10 mg total) by mouth daily.  Dispense: 90 tablet; Refill: 1  Next appt: 4 months.  Carlos American. [place], [facility] [phone]

## 2020-06-17 NOTE — Patient Instructions (Signed)
[name] , Thank you for taking time to come for your Medicare Wellness Visit. I appreciate your ongoing commitment to your health goals. Please review the following plan we discussed and let me know if I can assist you in the future.   Screening recommendations/referrals: Colonoscopy aged out Recommended yearly ophthalmology/optometry visit for glaucoma screening and checkup Recommended yearly dental visit for hygiene and checkup  Vaccinations: Influenza vaccine due September 2021 Pneumococcal vaccine up to date Tdap vaccine DUE- recommended to get at your local pharmacy  Shingles vaccine - na    Advanced directives: on file.   Conditions/risks identified: obesity, complications related to diabetes, hypertension, cardiovascular disease   Next appointment: 1 year  Preventive Care 78 Years and Older, Male Preventive care refers to lifestyle choices and visits with your health care provider that can promote health and wellness. What does preventive care include?  A yearly physical exam. This is also called an annual well check.  Dental exams once or twice a year.  Routine eye exams. Ask your health care provider how often you should have your eyes checked.  Personal lifestyle choices, including:  Daily care of your teeth and gums.  Regular physical activity.  Eating a healthy diet.  Avoiding tobacco and drug use.  Limiting alcohol use.  Practicing safe sex.  Taking low doses of aspirin every day.  Taking vitamin and mineral supplements as recommended by your health care provider. What happens during an annual well check? The services and screenings done by your health care provider during your annual well check will depend on your age, overall health, lifestyle risk factors, and family history of disease. Counseling  Your health care provider may ask you questions about your:  Alcohol use.  Tobacco use.  Drug use.  Emotional well-being.  Home and  relationship well-being.  Sexual activity.  Eating habits.  History of falls.  Memory and ability to understand (cognition).  Work and work Statistician. Screening  You may have the following tests or measurements:  Height, weight, and BMI.  Blood pressure.  Lipid and cholesterol levels. These may be checked every 5 years, or more frequently if you are over 65 years old.  Skin check.  Lung cancer screening. You may have this screening every year starting at age 57 if you have a 30-pack-year history of smoking and currently smoke or have quit within the past 15 years.  Fecal occult blood test (FOBT) of the stool. You may have this test every year starting at age 75.  Flexible sigmoidoscopy or colonoscopy. You may have a sigmoidoscopy every 5 years or a colonoscopy every 10 years starting at age 98.  Prostate cancer screening. Recommendations will vary depending on your family history and other risks.  Hepatitis C blood test.  Hepatitis B blood test.  Sexually transmitted disease (STD) testing.  Diabetes screening. This is done by checking your blood sugar (glucose) after you have not eaten for a while (fasting). You may have this done every 1-3 years.  Abdominal aortic aneurysm (AAA) screening. You may need this if you are a current or former smoker.  Osteoporosis. You may be screened starting at age 32 if you are at high risk. Talk with your health care provider about your test results, treatment options, and if necessary, the need for more tests. Vaccines  Your health care provider may recommend certain vaccines, such as:  Influenza vaccine. This is recommended every year.  Tetanus, diphtheria, and acellular pertussis (Tdap, Td) vaccine. You may  need a Td booster every 10 years.  Zoster vaccine. You may need this after age 60.  Pneumococcal 13-valent conjugate (PCV13) vaccine. One dose is recommended after age 31.  Pneumococcal polysaccharide (PPSV23) vaccine. One  dose is recommended after age 33. Talk to your health care provider about which screenings and vaccines you need and how often you need them. This information is not intended to replace advice given to you by your health care provider. Make sure you discuss any questions you have with your health care provider. Document Released: 01/08/2016 Document Revised: 08/31/2016 Document Reviewed: 10/13/2015 Elsevier Interactive Patient Education  2017 [place] Prevention in the Home Falls can cause injuries. They can happen to people of all ages. There are many things you can do to make your home safe and to help prevent falls. What can I do on the outside of my home?  Regularly fix the edges of walkways and driveways and fix any cracks.  Remove anything that might make you trip as you walk through a door, such as a raised step or threshold.  Trim any bushes or trees on the path to your home.  Use bright outdoor lighting.  Clear any walking paths of anything that might make someone trip, such as rocks or tools.  Regularly check to see if handrails are loose or broken. Make sure that both sides of any steps have handrails.  Any raised decks and porches should have guardrails on the edges.  Have any leaves, snow, or ice cleared regularly.  Use sand or salt on walking paths during winter.  Clean up any spills in your garage right away. This includes oil or grease spills. What can I do in the bathroom?  Use night lights.  Install grab bars by the toilet and in the tub and shower. Do not use towel bars as grab bars.  Use non-skid mats or decals in the tub or shower.  If you need to sit down in the shower, use a plastic, non-slip stool.  Keep the floor dry. Clean up any water that spills on the floor as soon as it happens.  Remove soap buildup in the tub or shower regularly.  Attach bath mats securely with double-sided non-slip rug tape.  Do not have throw rugs and other  things on the floor that can make you trip. What can I do in the bedroom?  Use night lights.  Make sure that you have a light by your bed that is easy to reach.  Do not use any sheets or blankets that are too big for your bed. They should not hang down onto the floor.  Have a firm chair that has side arms. You can use this for support while you get dressed.  Do not have throw rugs and other things on the floor that can make you trip. What can I do in the kitchen?  Clean up any spills right away.  Avoid walking on wet floors.  Keep items that you use a lot in easy-to-reach places.  If you need to reach something above you, use a strong step stool that has a grab bar.  Keep electrical cords out of the way.  Do not use floor polish or wax that makes floors slippery. If you must use wax, use non-skid floor wax.  Do not have throw rugs and other things on the floor that can make you trip. What can I do with my stairs?  Do not leave any items on  the stairs.  Make sure that there are handrails on both sides of the stairs and use them. Fix handrails that are broken or loose. Make sure that handrails are as long as the stairways.  Check any carpeting to make sure that it is firmly attached to the stairs. Fix any carpet that is loose or worn.  Avoid having throw rugs at the top or bottom of the stairs. If you do have throw rugs, attach them to the floor with carpet tape.  Make sure that you have a light switch at the top of the stairs and the bottom of the stairs. If you do not have them, ask someone to add them for you. What else can I do to help prevent falls?  Wear shoes that:  Do not have high heels.  Have rubber bottoms.  Are comfortable and fit you well.  Are closed at the toe. Do not wear sandals.  If you use a stepladder:  Make sure that it is fully opened. Do not climb a closed stepladder.  Make sure that both sides of the stepladder are locked into place.  Ask  someone to hold it for you, if possible.  Clearly mark and make sure that you can see:  Any grab bars or handrails.  First and last steps.  Where the edge of each step is.  Use tools that help you move around (mobility aids) if they are needed. These include:  Canes.  Walkers.  Scooters.  Crutches.  Turn on the lights when you go into a dark area. Replace any light bulbs as soon as they burn out.  Set up your furniture so you have a clear path. Avoid moving your furniture around.  If any of your floors are uneven, fix them.  If there are any pets around you, be aware of where they are.  Review your medicines with your doctor. Some medicines can make you feel dizzy. This can increase your chance of falling. Ask your doctor what other things that you can do to help prevent falls. This information is not intended to replace advice given to you by your health care provider. Make sure you discuss any questions you have with your health care provider. Document Released: 10/08/2009 Document Revised: 05/19/2016 Document Reviewed: 01/16/2015 Elsevier Interactive Patient Education  2017 [employer].

## 2020-06-18 LAB — LIPID PANEL
Cholesterol: 107 mg/dL (ref <200)
HDL: 38 mg/dL — ABNORMAL LOW (ref >=40)
LDL Cholesterol (Calc): 49 mg/dL (calc)
Non-HDL Cholesterol (Calc): 69 mg/dL (calc) (ref <130)
Total CHOL/HDL Ratio: 2.8 (calc) (ref <5.0)
Triglycerides: 118 mg/dL (ref <150)

## 2020-06-18 LAB — HEPATITIS C ANTIBODY
Hepatitis C Ab: NONREACTIVE
SIGNAL TO CUT-OFF: 0.02 (ref <1.00)

## 2020-06-18 LAB — CBC WITH DIFFERENTIAL/PLATELET
Absolute Monocytes: 740 cells/uL (ref 200–950)
Basophils Absolute: 52 cells/uL (ref 0–200)
Basophils Relative: 0.7 %
Eosinophils Absolute: 377 cells/uL (ref 15–500)
Eosinophils Relative: 5.1 %
HCT: 43.5 % (ref 38.5–50.0)
Hemoglobin: 14.2 g/dL (ref 13.2–17.1)
Lymphs Abs: 1783 cells/uL (ref 850–3900)
MCH: 27.5 pg (ref 27.0–33.0)
MCHC: 32.6 g/dL (ref 32.0–36.0)
MCV: 84.1 fL (ref 80.0–100.0)
MPV: 10.4 fL (ref 7.5–12.5)
Monocytes Relative: 10 %
Neutro Abs: 4447 cells/uL (ref 1500–7800)
Neutrophils Relative %: 60.1 %
Platelets: 167 10*3/uL (ref 140–400)
RBC: 5.17 10*6/uL (ref 4.20–5.80)
RDW: 13.3 % (ref 11.0–15.0)
Total Lymphocyte: 24.1 %
WBC: 7.4 10*3/uL (ref 3.8–10.8)

## 2020-06-18 LAB — COMPLETE METABOLIC PANEL WITH GFR
AG Ratio: 1.5 (calc) (ref 1.0–2.5)
ALT: 19 U/L (ref 9–46)
AST: 14 U/L (ref 10–35)
Albumin: 3.8 g/dL (ref 3.6–5.1)
Alkaline phosphatase (APISO): 53 U/L (ref 35–144)
BUN: 18 mg/dL (ref 7–25)
CO2: 26 mmol/L (ref 20–32)
Calcium: 9.3 mg/dL (ref 8.6–10.3)
Chloride: 107 mmol/L (ref 98–110)
Creat: 0.89 mg/dL (ref 0.70–1.18)
GFR, Est African American: 96 mL/min/{1.73_m2} (ref >=60)
GFR, Est Non African American: 82 mL/min/{1.73_m2} (ref >=60)
Globulin: 2.6 g/dL (calc) (ref 1.9–3.7)
Glucose, Bld: 102 mg/dL (ref 65–139)
Potassium: 4 mmol/L (ref 3.5–5.3)
Sodium: 142 mmol/L (ref 135–146)
Total Bilirubin: 0.6 mg/dL (ref 0.2–1.2)
Total Protein: 6.4 g/dL (ref 6.1–8.1)

## 2020-07-02 ENCOUNTER — Telehealth: Payer: Self-pay | Admitting: Internal Medicine

## 2020-07-02 NOTE — Telephone Encounter (Signed)
Patient's wife Seth [name] called re: Request to send the following RX with 6 refills asap:  Medication Refill Request  Did you call your pharmacy and request this refill first? Yes . If patient has not contacted pharmacy first, instruct them to do so for future refills.  . Remind them that contacting the pharmacy for their refill is the quickest method to get the refill.  . Refill policy also stated that it will take anywhere between 24-72 hours to receive the refill.    Name of medication?   Insulin Lispro Prot & Lispro (HUMALOG MIX 75/25 KWIKPEN) (75-25) 100 UNIT/ML Kwikpen  Is this a 90 day supply? Requests RX have 6 refills  Name and location of pharmacy? [facility]-who send RX to [facility] Ph# [phone] Seth [name] does not have a fax#)  Seth [name] requests to be called  at ph# [phone] once RX has been sent to confirm delivery date with [facility]  . Is the request for diabetes test strips? No . If yes, what brand? N/A

## 2020-07-02 NOTE — Telephone Encounter (Signed)
Lft vm to return call, pt is on patient assistance and should contact them to see when this medication was shipped.

## 2020-07-03 ENCOUNTER — Other Ambulatory Visit: Payer: Self-pay | Admitting: Cardiovascular Disease

## 2020-07-03 ENCOUNTER — Ambulatory Visit (INDEPENDENT_AMBULATORY_CARE_PROVIDER_SITE_OTHER): Payer: Medicare Other | Admitting: *Deleted

## 2020-07-03 ENCOUNTER — Other Ambulatory Visit: Payer: Self-pay

## 2020-07-03 ENCOUNTER — Other Ambulatory Visit: Payer: Self-pay | Admitting: Nurse Practitioner

## 2020-07-03 ENCOUNTER — Other Ambulatory Visit: Payer: Self-pay | Admitting: Internal Medicine

## 2020-07-03 DIAGNOSIS — I4891 Unspecified atrial fibrillation: Secondary | ICD-10-CM

## 2020-07-03 DIAGNOSIS — Z5181 Encounter for therapeutic drug level monitoring: Secondary | ICD-10-CM | POA: Diagnosis not present

## 2020-07-03 LAB — POCT INR: INR: 1.8 — AB (ref 2.0–3.0)

## 2020-07-03 NOTE — Patient Instructions (Signed)
Description   Today take 2 tablets then continue on same dosage 1.5 tablets daily.  Recheck in 4 weeks. Call with any new medications [phone].

## 2020-07-21 NOTE — Telephone Encounter (Signed)
Patients wife states [employer] informed her that the Rx has expired with [employer] and they need [place] to call in a new Rx to them before they can ship it. Patient's wife wanted me to inform Dr Kelton Pillar of this and let her know that Ralph Leyden will be faxing [place] this paperwork today. Patient's wife requested someone contact her when this has been sent to [facility]. Ph# [phone]    [facility] fax# [phone]

## 2020-07-21 NOTE — Telephone Encounter (Signed)
Are you faxing paperwork to this office or did you inform pt that it would be Thursday before we were back in [place] office?

## 2020-07-21 NOTE — Telephone Encounter (Signed)
I did inform patient that you were out of the office until Thursday, I can fax it there but I have not seen it come through yet.  When it does come through, what is the best fax#?

## 2020-07-21 NOTE — Telephone Encounter (Signed)
[phone] would be the best fax # thank you

## 2020-07-23 NOTE — Telephone Encounter (Signed)
Fax from [facility] was received today at 12:39 pm. Called pt and his wife and informed them that refill request was received and will be signed and faxed.

## 2020-07-23 NOTE — Telephone Encounter (Signed)
Patient's wife Pamala Hurry called to follow up on status of previous note encounter. Pamala Hurry states patient is very low on insulin. She requests to be called asap at ph# called  at ph# [phone]

## 2020-07-31 ENCOUNTER — Other Ambulatory Visit: Payer: Self-pay

## 2020-07-31 ENCOUNTER — Ambulatory Visit (INDEPENDENT_AMBULATORY_CARE_PROVIDER_SITE_OTHER): Payer: Medicare Other | Admitting: Pharmacist

## 2020-07-31 DIAGNOSIS — I4891 Unspecified atrial fibrillation: Secondary | ICD-10-CM

## 2020-07-31 DIAGNOSIS — Z5181 Encounter for therapeutic drug level monitoring: Secondary | ICD-10-CM | POA: Diagnosis not present

## 2020-07-31 LAB — POCT INR: INR: 2.4 (ref 2.0–3.0)

## 2020-07-31 NOTE — Patient Instructions (Signed)
Description   Continue on same dosage 1.5 tablets daily.  Recheck in 5 weeks. Call with any new medications [phone].

## 2020-08-10 ENCOUNTER — Other Ambulatory Visit: Payer: Self-pay

## 2020-08-10 ENCOUNTER — Ambulatory Visit (INDEPENDENT_AMBULATORY_CARE_PROVIDER_SITE_OTHER): Payer: Medicare Other | Admitting: Internal Medicine

## 2020-08-10 ENCOUNTER — Encounter: Payer: Self-pay | Admitting: Internal Medicine

## 2020-08-10 VITALS — BP 130/80 | HR 65 | Ht 66.0 in | Wt 242.6 lb

## 2020-08-10 DIAGNOSIS — R809 Proteinuria, unspecified: Secondary | ICD-10-CM | POA: Diagnosis not present

## 2020-08-10 DIAGNOSIS — E1129 Type 2 diabetes mellitus with other diabetic kidney complication: Secondary | ICD-10-CM | POA: Diagnosis not present

## 2020-08-10 DIAGNOSIS — E1143 Type 2 diabetes mellitus with diabetic autonomic (poly)neuropathy: Secondary | ICD-10-CM

## 2020-08-10 DIAGNOSIS — E1165 Type 2 diabetes mellitus with hyperglycemia: Secondary | ICD-10-CM

## 2020-08-10 DIAGNOSIS — Z794 Long term (current) use of insulin: Secondary | ICD-10-CM

## 2020-08-10 LAB — POCT GLYCOSYLATED HEMOGLOBIN (HGB A1C): Hemoglobin A1C: 7.7 % — AB (ref 4.0–5.6)

## 2020-08-10 NOTE — Progress Notes (Signed)
Name: Seth [name]  Age/ Sex: 79 y.o., male   MRN/ DOB: [id number], 1941-05-11     PCP: Lauree Chandler, NP   Reason for Endocrinology Evaluation: Type 2 Diabetes Mellitus  Initial Endocrine Consultative Visit: 10/09/2019    PATIENT IDENTIFIER: Seth [name] is a 79 y.o. male with a past medical history of T2DM, HTN, PVD, CAD, A.Fib and Prostate Ca ( S/P chemo and radiation). The patient has followed with Endocrinology clinic since 10/09/2019 for consultative assistance with management of his diabetes.  DIABETIC HISTORY:  Seth [name] was diagnosed with T2DM many years ago.  He was prescribed Jardiance in the past, but unable to use due to cost.  hemoglobin A1c has ranged from 6.4% in 2015, peaking at 11.7% in 2020 On his initial visit to our clinic his A1c was 9.7%, he was on glipizide, Toujeo, and metformin.  We switched his Toujeo to Humalog mix, we stopped glipizide and continued Metformin.  Lives with wife  SUBJECTIVE:   During the last visit (04/08/2020): A1c 12.5 %.  We increased Humalog mix and continued Metformin  Today (08/10/2020): Seth [name] is here for his 6-monthfollow-up on diabetes management.  He checks his blood sugars to times daily, preprandial to breakfast and bedtime. The patient has not had hypoglycemic episodes since the last clinic visit. Otherwise, the patient has not required any recent emergency interventions for hypoglycemia and has not had recent hospitalizations secondary to hyper or hypoglycemic episodes.     HOME DIABETES REGIMEN:  Humalog Mix 50 units BID- has been taking 30 units BID  Metformin 1000 mg BID     Statin: Yes ACE-I/ARB: Yes    GLUCOSE LOG:  140- 250 mg/dL     DIABETIC COMPLICATIONS: Microvascular complications:   Neuropathy   Denies: CKD, retinopathy   Last eye exam: Completed 12/2018  Macrovascular complications:   CAD, PVD  Denies: CVA   HISTORY:  Past Medical History:  Past Medical History:   Diagnosis Date  . Atrial fibrillation (HCC)    a. coumadin;  b. Amiodarone  . CAD (coronary artery disease)    a.  Lexiscan Myoview (02/14/14):  Apical, inferolateral scar; ? Mild ischemia; EF 32%.;  b.  LHC (02/21/14):  dLM 100%, LAD 100%, CFX 100%, dRCA 50%. => CABG (L-LAD, S-OM, S-PDA)  . Chronic systolic heart failure ([place]   . CKD (chronic kidney disease)    proteinuria  . Hemorrhage of gastrointestinal tract, unspecified   . Hemorrhage of rectum and anus   . HLD (hyperlipidemia)   . HTN (hypertension)   . Impotence of organic origin   . Ischemic cardiomyopathy    a. Echo (02/17/14):  EF 35-40%, anteroseptal and apical AK, Gr 2 DD, MAC, mod LAE.  .Marland KitchenNephrolithiasis   . Peripheral vascular disease, unspecified ([place]   . Prostate cancer ([place]   . Thrombocytopenia, unspecified ([place]   . Tobacco use disorder   . 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 BACK SURGERY    . CATARACT EXTRACTION  2010  . CLIPPING OF ATRIAL APPENDAGE Left 02/24/2014   Procedure: CLIPPING OF ATRIAL APPENDAGE;  Surgeon: BGaye Pollack MD;  [facility]  Service: Open Heart Surgery;  Laterality: Left;  . COLONOSCOPY  2005   Normal  . CORONARY ARTERY BYPASS GRAFT N/A 02/24/2014   Procedure: Coronary artery bypass graft times three using  left internal mammary artery and left leg greater saphenous vein harvested endoscopically.;  Surgeon: Gaye Pollack, MD;  [facility];  Service: Open Heart Surgery;  Laterality: N/A;  . INTRAOPERATIVE TRANSESOPHAGEAL ECHOCARDIOGRAM N/A 02/24/2014   Procedure: INTRAOPERATIVE TRANSESOPHAGEAL ECHOCARDIOGRAM;  Surgeon: Gaye Pollack, MD;  Location: [place] OR;  Service: Open Heart Surgery;  Laterality: N/A;  . LEFT HEART CATHETERIZATION WITH CORONARY ANGIOGRAM N/A 02/21/2014   Procedure: LEFT HEART CATHETERIZATION WITH CORONARY ANGIOGRAM;  Surgeon:  Peter M Martinique, MD;  [facility];  Service: Cardiovascular;  Laterality: N/A;    Social History:  reports that he has never smoked. He quit smokeless tobacco use about 6 years ago.  His smokeless tobacco use included chew. He reports that he does not drink alcohol and does not use drugs. Family History:  Family History  Problem Relation Age of Onset  . Cancer Mother 29       leukemia  . Heart disease Father   . Heart attack Father 38  . Lung cancer Brother   . Colon cancer Neg Hx   . Rectal cancer Neg Hx      HOME MEDICATIONS: Allergies as of 08/10/2020   No Known Allergies     Medication List       Accurate as of August 10, 2020  1:28 PM. If you have any questions, ask your nurse or doctor.        amLODipine 10 MG tablet Commonly known as: NORVASC Take 1 tablet (10 mg total) by mouth daily.   aspirin EC 81 MG tablet Take 1 tablet (81 mg total) by mouth daily.   atorvastatin 40 MG tablet Commonly known as: LIPITOR TAKE ONE TABLET BY MOUTH ONE TIME DAILY   Calcium Carbonate-Vitamin D 600-200 MG-UNIT Tabs Take 1 tablet by mouth daily. For bones   carvedilol 25 MG tablet Commonly known as: COREG Take 25 mg by mouth 2 (two) times daily with a meal.   FLAXSEED OIL PO Take 1 tablet by mouth daily.   furosemide 40 MG tablet Commonly known as: LASIX take 2 tablets by mouth every morning and 1 tablet every evening   Insulin Lispro Prot & Lispro (75-25) 100 UNIT/ML Kwikpen Commonly known as: HumaLOG Mix 75/25 KwikPen Inject 50 Units into the skin 2 (two) times daily with a meal. What changed: how much to take   lisinopril 40 MG tablet Commonly known as: ZESTRIL Take 40 mg by mouth 2 (two) times daily.   metFORMIN 1000 MG tablet Commonly known as: GLUCOPHAGE TAKE ONE TABLET BY MOUTH TWICE DAILY WITH MEAL   MULTIVITAMINS PO Take 1 tablet by mouth daily.   potassium chloride SA 20 MEQ tablet Commonly known as: KLOR-CON TAKE ONE TABLET BY MOUTH ONE  TIME DAILY   warfarin 5 MG tablet Commonly known as: COUMADIN Take as directed by the anticoagulation clinic. If you are unsure how to take this medication, talk to your nurse or doctor. Original instructions: TAKE ONE AND ONE-HALF TABLETS BY MOUTH DAILY OR AS DIRECTED BY CLINIC        OBJECTIVE:   Vital Signs: BP 130/80 (BP Location: Left Arm, Patient Position: Sitting, Cuff Size: Normal)   Pulse 65   Ht _0  (1.676 m)   Wt 242 lb 9.6 oz (110 kg)   SpO2 97%   BMI 39.16 kg/m   Wt Readings from Last 3 Encounters:  08/10/20 242 lb 9.6 oz (110 kg)  06/17/20 224 lb (101.6 kg)  04/08/20 234  lb 3.2 oz (106.2 kg)     Exam: General: Pt appears well and is in NAD  Lungs: Clear with good BS bilat with no rales, rhonchi, or wheezes  Heart: RRR with normal S1 and S2 and no gallops; no murmurs; no rub  Extremities:  No pretibial edema..  Neuro: MS is good with appropriate affect, pt is alert and Ox3       DM foot exam: 04/08/2020  The skin of the feet is intact without sores or ulcerations. The pedal pulses are 2+ on right and 2+ on left. The sensation is absent to a screening 5.07, 10 gram monofilament bilaterally   DATA REVIEWED:  Lab Results  Component Value Date   HGBA1C 7.7 (A) 08/10/2020   HGBA1C 12.5 (A) 04/08/2020   HGBA1C 12.3 (A) 01/09/2020   Lab Results  Component Value Date   MICROALBUR 34.1 11/02/2018   LDLCALC 49 06/17/2020   CREATININE 0.89 06/17/2020   Lab Results  Component Value Date   MICRALBCREAT 897 (H) 11/02/2018     Lab Results  Component Value Date   CHOL 107 06/17/2020   HDL 38 (L) 06/17/2020   LDLCALC 49 06/17/2020   TRIG 118 06/17/2020   CHOLHDL 2.8 06/17/2020         ASSESSMENT / PLAN / RECOMMENDATIONS:   1) Type 2 Diabetes Mellitus, Sub-optimaly controlled, With neuropathic, micro albumin urea and macrovascular complications - Most recent A1c of 7.7 %. Goal A1c <7.0%.  - A1c down from 12.5% . Pt realized that he was not  removing the needle cap , hence was not getting the insulin.  - He has been much better and writing glucose readings down, he is using less insulin then previously prescribed once he realized he is taking it correctly now  - Will make the following adjustments.      Plan: MEDICATIONS:  Increase Humalog mix to 34 units before breakfast and 30 units with  supper  Continue Metformin 1000 mg twice daily  EDUCATION / INSTRUCTIONS:  BG monitoring instructions: Patient is instructed to check his blood sugars to times a day, before breakfast and supper.  Call [facility] if: BG persistently < 70  . I reviewed the Rule of 15 for the treatment of hypoglycemia in detail with the patient. Literature supplied.    F/U in 4 months   Signed electronically by: Mack Guise, MD  [facility]  [facility] [address] [place],  [address] Phone: [phone] FAX: [phone]   CC: Lauree Chandler, NP [place] [address] Phone: [phone]  Fax: [phone]  Return to Endocrinology clinic as below: Future Appointments  Date Time Provider [place]  09/10/2020  1:00 PM [facility]  10/21/2020  1:00 PM Lauree Chandler, NP [facility] None  11/16/2020  3:40 PM [name], MD [facility]  06/21/2021  1:00 PM Lauree Chandler, NP [facility] None

## 2020-08-10 NOTE — Patient Instructions (Signed)
-   INcrease Humalog Mix to 34 units with  Breakfast and 30 units with  Supper  - Continue Metformin 1 tablet with Breakfast and Supper       HOW TO TREAT LOW BLOOD SUGARS (Blood sugar LESS THAN 70 MG/DL)  Please follow the RULE OF 15 for the treatment of hypoglycemia treatment (when your (blood sugars are less than 70 mg/dL)    STEP 1: Take 15 grams of carbohydrates when your blood sugar is low, which includes:   3-4 GLUCOSE TABS  OR  3-4 OZ OF JUICE OR REGULAR SODA OR  ONE TUBE OF GLUCOSE GEL     STEP 2: RECHECK blood sugar in 15 MINUTES STEP 3: If your blood sugar is still low at the 15 minute recheck --> then, go back to STEP 1 and treat AGAIN with another 15 grams of carbohydrates.

## 2020-09-07 ENCOUNTER — Other Ambulatory Visit: Payer: Self-pay

## 2020-09-07 ENCOUNTER — Ambulatory Visit (INDEPENDENT_AMBULATORY_CARE_PROVIDER_SITE_OTHER): Payer: Medicare Other

## 2020-09-07 DIAGNOSIS — Z5181 Encounter for therapeutic drug level monitoring: Secondary | ICD-10-CM | POA: Diagnosis not present

## 2020-09-07 DIAGNOSIS — I4891 Unspecified atrial fibrillation: Secondary | ICD-10-CM

## 2020-09-07 LAB — POCT INR: INR: 2.5 (ref 2.0–3.0)

## 2020-09-07 NOTE — Patient Instructions (Signed)
Description   Continue on same dosage 1.5 tablets daily.  Recheck in 6 weeks. Call with any new medications [phone].

## 2020-09-14 ENCOUNTER — Other Ambulatory Visit: Payer: Self-pay | Admitting: Cardiovascular Disease

## 2020-09-21 DIAGNOSIS — Z23 Encounter for immunization: Secondary | ICD-10-CM | POA: Diagnosis not present

## 2020-10-21 ENCOUNTER — Ambulatory Visit (INDEPENDENT_AMBULATORY_CARE_PROVIDER_SITE_OTHER): Payer: Medicare Other | Admitting: Nurse Practitioner

## 2020-10-21 ENCOUNTER — Ambulatory Visit (INDEPENDENT_AMBULATORY_CARE_PROVIDER_SITE_OTHER): Payer: Medicare Other | Admitting: *Deleted

## 2020-10-21 ENCOUNTER — Encounter: Payer: Self-pay | Admitting: Nurse Practitioner

## 2020-10-21 ENCOUNTER — Other Ambulatory Visit: Payer: Self-pay

## 2020-10-21 VITALS — BP 120/70 | HR 65 | Temp 97.5°F | Ht 66.0 in | Wt 243.4 lb

## 2020-10-21 DIAGNOSIS — I25708 Atherosclerosis of coronary artery bypass graft(s), unspecified, with other forms of angina pectoris: Secondary | ICD-10-CM

## 2020-10-21 DIAGNOSIS — I4891 Unspecified atrial fibrillation: Secondary | ICD-10-CM

## 2020-10-21 DIAGNOSIS — I10 Essential (primary) hypertension: Secondary | ICD-10-CM | POA: Diagnosis not present

## 2020-10-21 DIAGNOSIS — E1169 Type 2 diabetes mellitus with other specified complication: Secondary | ICD-10-CM

## 2020-10-21 DIAGNOSIS — E785 Hyperlipidemia, unspecified: Secondary | ICD-10-CM

## 2020-10-21 DIAGNOSIS — Z6836 Body mass index (BMI) 36.0-36.9, adult: Secondary | ICD-10-CM

## 2020-10-21 DIAGNOSIS — E1143 Type 2 diabetes mellitus with diabetic autonomic (poly)neuropathy: Secondary | ICD-10-CM

## 2020-10-21 DIAGNOSIS — Z5181 Encounter for therapeutic drug level monitoring: Secondary | ICD-10-CM

## 2020-10-21 DIAGNOSIS — Z8546 Personal history of malignant neoplasm of prostate: Secondary | ICD-10-CM | POA: Diagnosis not present

## 2020-10-21 DIAGNOSIS — D696 Thrombocytopenia, unspecified: Secondary | ICD-10-CM

## 2020-10-21 DIAGNOSIS — I5022 Chronic systolic (congestive) heart failure: Secondary | ICD-10-CM

## 2020-10-21 DIAGNOSIS — I739 Peripheral vascular disease, unspecified: Secondary | ICD-10-CM

## 2020-10-21 DIAGNOSIS — G629 Polyneuropathy, unspecified: Secondary | ICD-10-CM

## 2020-10-21 DIAGNOSIS — Z794 Long term (current) use of insulin: Secondary | ICD-10-CM

## 2020-10-21 LAB — POCT INR: INR: 3.4 — AB (ref 2.0–3.0)

## 2020-10-21 MED ORDER — LISINOPRIL 40 MG PO TABS: 40.0000 mg | ORAL_TABLET | Freq: Two times a day (BID) | ORAL | 1 refills | Status: DC

## 2020-10-21 MED ORDER — FUROSEMIDE 40 MG PO TABS: ORAL_TABLET | ORAL | 0 refills | Status: DC

## 2020-10-21 NOTE — Progress Notes (Signed)
Careteam: Patient Care Team: Lauree Chandler, NP as PCP - General (Geriatric Medicine) [name], MD as PCP - Cardiology (Cardiology) [name], MD as Attending Physician (Urology) [name], MD as Consulting Physician (Nephrology) [name], OD (Optometry)  PLACE OF SERVICE:  [place] Direct[facility] information    No Known Allergies  Chief Complaint  Patient presents with  . Medical Management of Chronic Issues    4 month foloow up visit Patient states he had eye exam about a month ago at [facility] on [address].Patient would like 90 supply of all his medications.  . Quality Metric Gaps    Eye exam, foot exam     HPI: Patient is a 79 y.o. male for routine follow up.   DM- sees endocrinologist for diabetic management, unsure what his blood sugars have been. Went to the eye doctor about a month ago. Last A1c 7.7  A fib- followed by coumadin clinic. Recent INR 3.4, previously has been well controlled. Was told to hold coumadin today and then resume previous dosing.  Followed by cardiologist, no palpitations.   Has gained ~20 lbs since June.  [place] a place [place], does not have a walking routine.   htn- controlled on lisinipril, norvasc, coreg, lasix and potassium   Hyperlipidemia- LDL at goal on lipitor 40 mg daily   Review of Systems:  Review of Systems  Constitutional: Negative for chills, fever and weight loss.  HENT: Negative for tinnitus.   Respiratory: Negative for cough, sputum production and shortness of breath.   Cardiovascular: Negative for chest pain, palpitations and leg swelling.  Gastrointestinal: Negative for abdominal pain, constipation, diarrhea and heartburn.  Genitourinary: Negative for dysuria, frequency and urgency.  Musculoskeletal: Negative for back pain, falls, joint pain and myalgias.  Skin: Negative.   Neurological: Positive for tingling and sensory change. Negative for dizziness and  headaches.  Psychiatric/Behavioral: Negative for depression and memory loss. The patient does not have insomnia.     Past Medical History:  Diagnosis Date  . Atrial fibrillation (HCC)    a. coumadin;  b. Amiodarone  . CAD (coronary artery disease)    a.  Lexiscan Myoview (02/14/14):  Apical, inferolateral scar; ? Mild ischemia; EF 32%.;  b.  LHC (02/21/14):  dLM 100%, LAD 100%, CFX 100%, dRCA 50%. => CABG (L-LAD, S-OM, S-PDA)  . Chronic systolic heart failure ([place])   . CKD (chronic kidney disease)    proteinuria  . Hemorrhage of gastrointestinal tract, unspecified   . Hemorrhage of rectum and anus   . HLD (hyperlipidemia)   . HTN (hypertension)   . Impotence of organic origin   . Ischemic cardiomyopathy    a. Echo (02/17/14):  EF 35-40%, anteroseptal and apical AK, Gr 2 DD, MAC, mod LAE.  [name] Nephrolithiasis   . Peripheral vascular disease, unspecified ([place])   . Prostate cancer ([place])   . Thrombocytopenia, unspecified ([place])   . Tobacco use disorder   . Type II or unspecified type diabetes mellitus with renal manifestations, uncontrolled(250.42)    Past Surgical History:  Procedure Laterality Date  . BACK SURGERY    . CATARACT EXTRACTION  2010  . CLIPPING OF ATRIAL APPENDAGE Left 02/24/2014   Procedure: CLIPPING OF ATRIAL APPENDAGE;  Surgeon: Gaye Pollack, MD;  Location: [place];  Service: Open Heart Surgery;  Laterality: Left;  . COLONOSCOPY  2005   Normal  . CORONARY ARTERY BYPASS GRAFT N/A 02/24/2014   Procedure: Coronary artery bypass  graft times three using left internal mammary artery and left leg greater saphenous vein harvested endoscopically.;  Surgeon: Gaye Pollack, MD;  [facility];  Service: Open Heart Surgery;  Laterality: N/A;  . INTRAOPERATIVE TRANSESOPHAGEAL ECHOCARDIOGRAM N/A 02/24/2014   Procedure: INTRAOPERATIVE TRANSESOPHAGEAL ECHOCARDIOGRAM;  Surgeon: Gaye Pollack, MD;  Location: [place] OR;  Service: Open Heart Surgery;  Laterality: N/A;  . LEFT HEART  CATHETERIZATION WITH CORONARY ANGIOGRAM N/A 02/21/2014   Procedure: LEFT HEART CATHETERIZATION WITH CORONARY ANGIOGRAM;  Surgeon: Peter M Martinique, MD;  Location: [facility] CATH LAB;  Service: Cardiovascular;  Laterality: N/A;   Social History:   reports that he has never smoked. He quit smokeless tobacco use about 6 years ago.  His smokeless tobacco use included chew. He reports that he does not drink alcohol and does not use drugs.  Family History  Problem Relation Age of Onset  . Cancer Mother 86       leukemia  . Heart disease Father   . Heart attack Father 41  . Lung cancer Brother   . Colon cancer Neg Hx   . Rectal cancer Neg Hx     Medications: Patient's Medications  New Prescriptions   No medications on file  Previous Medications   AMLODIPINE (NORVASC) 10 MG TABLET    Take 1 tablet (10 mg total) by mouth daily.   ASPIRIN EC 81 MG TABLET    Take 1 tablet (81 mg total) by mouth daily.   ATORVASTATIN (LIPITOR) 40 MG TABLET    TAKE ONE TABLET BY MOUTH ONE TIME DAILY   CALCIUM CARBONATE-VITAMIN D 600-200 MG-UNIT TABS    Take 1 tablet by mouth daily. For bones    CARVEDILOL (COREG) 25 MG TABLET    Take 25 mg by mouth 2 (two) times daily with a meal.   FLAXSEED, LINSEED, (FLAXSEED OIL PO)    Take 1 tablet by mouth daily.    FUROSEMIDE (LASIX) 40 MG TABLET    TAKE TWO TABLETS BY MOUTH IN THE MORNING AND ONE IN THE EVENING   INSULIN LISPRO PROT & LISPRO (HUMALOG MIX 75/25 KWIKPEN) (75-25) 100 UNIT/ML KWIKPEN    Inject 50 Units into the skin 2 (two) times daily with a meal.   LISINOPRIL (PRINIVIL,ZESTRIL) 40 MG TABLET    Take 40 mg by mouth 2 (two) times daily.   METFORMIN (GLUCOPHAGE) 1000 MG TABLET    TAKE ONE TABLET BY MOUTH TWICE DAILY WITH MEAL   MULTIPLE VITAMIN (MULTIVITAMINS PO)    Take 1 tablet by mouth daily.     POTASSIUM CHLORIDE SA (KLOR-CON) 20 MEQ TABLET    TAKE ONE TABLET BY MOUTH ONE TIME DAILY   WARFARIN (COUMADIN) 5 MG TABLET    TAKE ONE AND ONE-HALF TABLETS BY MOUTH DAILY OR  AS DIRECTED BY CLINIC  Modified Medications   No medications on file  Discontinued Medications   No medications on file    Physical Exam:  Vitals:   10/21/20 1250  BP: 120/70  Pulse: 65  Temp: (!) 97.5 F (36.4 C)  TempSrc: Temporal  SpO2: 97%  Weight: 243 lb 6.4 oz (110.4 kg)  Height: _0  (1.676 m)   Body mass index is 39.29 kg/m. Wt Readings from Last 3 Encounters:  10/21/20 243 lb 6.4 oz (110.4 kg)  08/10/20 242 lb 9.6 oz (110 kg)  06/17/20 224 lb (101.6 kg)    Physical Exam Constitutional:      General: He is not in acute distress.  Appearance: He is well-developed. He is not diaphoretic.  HENT:     Head: Normocephalic and atraumatic.     Mouth/Throat:     Pharynx: No oropharyngeal exudate.  Eyes:     Conjunctiva/sclera: Conjunctivae normal.     Pupils: Pupils are equal, round, and reactive to light.  Cardiovascular:     Rate and Rhythm: Normal rate and regular rhythm.     Pulses:          Dorsalis pedis pulses are 1+ on the right side and 1+ on the left side.       Posterior tibial pulses are 1+ on the right side and 1+ on the left side.     Heart sounds: Normal heart sounds.  Pulmonary:     Effort: Pulmonary effort is normal.     Breath sounds: Normal breath sounds.  Abdominal:     General: Bowel sounds are normal.     Palpations: Abdomen is soft.  Musculoskeletal:        General: No tenderness.     Cervical back: Normal range of motion and neck supple.  Feet:     Right foot:     Protective Sensation: 4 sites tested. 4 sites sensed.     Skin integrity: Dry skin present.     Toenail Condition: Right toenails are abnormally thick and long. Fungal disease present.    Left foot:     Protective Sensation: 4 sites tested. 4 sites sensed.     Skin integrity: Dry skin present.     Toenail Condition: Left toenails are abnormally thick and long. Fungal disease present. Skin:    General: Skin is warm and dry.  Neurological:     Mental Status: He is  alert and oriented to person, place, and time.    Labs reviewed: Basic Metabolic Panel: Recent Labs    06/17/20 1337  NA 142  K 4.0  CL 107  CO2 26  GLUCOSE 102  BUN 18  CREATININE 0.89  CALCIUM 9.3   Liver Function Tests: Recent Labs    06/17/20 1337  AST 14  ALT 19  BILITOT 0.6  PROT 6.4   No results for input(s): LIPASE, AMYLASE in the last 8760 hours. No results for input(s): AMMONIA in the last 8760 hours. CBC: Recent Labs    06/17/20 1337  WBC 7.4  NEUTROABS 4,447  HGB 14.2  HCT 43.5  MCV 84.1  PLT 167   Lipid Panel: Recent Labs    06/17/20 1337  CHOL 107  HDL 38*  LDLCALC 49  TRIG 118  CHOLHDL 2.8   TSH: No results for input(s): TSH in the last 8760 hours. A1C: Lab Results  Component Value Date   HGBA1C 7.7 (A) 08/10/2020     Assessment/Plan 1. Atrial fibrillation, unspecified type ([place]) -rate controlled, continues to follow up with coumadin clinic. No signs of bleeding or excessive bruising.  Continues on coumadin was seen today at coumadin clinic with elevated INR today, plans to hold coumadin tonight and resume previous dosing.  - CBC with Differential/Platelet  2. Chronic systolic congestive heart failure (HCC) -euvolemic on continues on coreg, lasix and lisinopril. Without worsening LE edema, shortness of breath or chest pains.  - furosemide (LASIX) 40 MG tablet; TAKE TWO TABLETS BY MOUTH IN THE MORNING AND ONE IN THE EVENING  Dispense: 270 tablet; Refill: 0 - lisinopril (ZESTRIL) 40 MG tablet; Take 1 tablet (40 mg total) by mouth 2 (two) times daily.  Dispense: 180 tablet; Refill:  1  3. Coronary artery disease of bypass graft of native heart with stable angina pectoris (HCC) Stable without chest pains on coreg. Continues on ASA mg   4. Class 2 severe obesity due to excess calories with serious comorbidity and body mass index (BMI) of 36.0 to 36.9 in adult [facility]) -encouraged weight loss through healthy lifestyle modifications such as  increase in physical activity and diet chagnes.  5. PVD (peripheral vascular disease) (HCC) Stable, without worsening pain in legs. Continues on coumadin and ASA   6. Thrombocytopenia ([place]) -will follow up cbc   8. Type 2 diabetes mellitus with diabetic autonomic neuropathy, with long-term current use of insulin (HCC) Ongoing neuropathy which is stable. Reports improved blood sugars since he is has been able to use humalog mix correctly. Denies hypoglycemia. Does not have blood glucose readings today -Encouraged dietary compliance, routine foot care/monitoring and to keep up with diabetic eye exams through ophthalmology.   9. Hyperlipidemia associated with type 2 diabetes mellitus ([place]) -dietary modifications encouraged, continues on lipitor 40 mg daily. LDL at goal in June.  10. History of prostate cancer Followed by urology, recent rise in PSA and now being followed every 6 months. Has had imagining per pt but this is not in the epic system and plan on repeat in 6 months.  11. Hypertension, benign Controlled on current regimen.  - COMPLETE METABOLIC PANEL WITH GFR  12. Neuropathy Stable at this time.    Next appt: 6 months, labs prior  Siria Calandro K. [place], [facility] [phone]

## 2020-10-21 NOTE — Patient Instructions (Signed)
Description   Do not take any Warfarin today then continue on same dosage 1.5 tablets daily.  Recheck in 4 weeks with MD appt (normally 6 wks). Call with any new medications [phone].

## 2020-10-22 LAB — COMPLETE METABOLIC PANEL WITH GFR
AG Ratio: 1.3 (calc) (ref 1.0–2.5)
ALT: 17 U/L (ref 9–46)
AST: 14 U/L (ref 10–35)
Albumin: 3.7 g/dL (ref 3.6–5.1)
Alkaline phosphatase (APISO): 55 U/L (ref 35–144)
BUN: 17 mg/dL (ref 7–25)
CO2: 30 mmol/L (ref 20–32)
Calcium: 9.3 mg/dL (ref 8.6–10.3)
Chloride: 104 mmol/L (ref 98–110)
Creat: 1.01 mg/dL (ref 0.70–1.18)
GFR, Est African American: 82 mL/min/{1.73_m2} (ref >=60)
GFR, Est Non African American: 71 mL/min/{1.73_m2} (ref >=60)
Globulin: 2.9 g/dL (calc) (ref 1.9–3.7)
Glucose, Bld: 201 mg/dL — ABNORMAL HIGH (ref 65–139)
Potassium: 4 mmol/L (ref 3.5–5.3)
Sodium: 142 mmol/L (ref 135–146)
Total Bilirubin: 0.5 mg/dL (ref 0.2–1.2)
Total Protein: 6.6 g/dL (ref 6.1–8.1)

## 2020-10-22 LAB — CBC WITH DIFFERENTIAL/PLATELET
Absolute Monocytes: 863 cells/uL (ref 200–950)
Basophils Absolute: 58 cells/uL (ref 0–200)
Basophils Relative: 0.7 %
Eosinophils Absolute: 739 cells/uL — ABNORMAL HIGH (ref 15–500)
Eosinophils Relative: 8.9 %
HCT: 40.3 % (ref 38.5–50.0)
Hemoglobin: 13.6 g/dL (ref 13.2–17.1)
Lymphs Abs: 2133 cells/uL (ref 850–3900)
MCH: 28.4 pg (ref 27.0–33.0)
MCHC: 33.7 g/dL (ref 32.0–36.0)
MCV: 84.1 fL (ref 80.0–100.0)
MPV: 10.1 fL (ref 7.5–12.5)
Monocytes Relative: 10.4 %
Neutro Abs: 4507 cells/uL (ref 1500–7800)
Neutrophils Relative %: 54.3 %
Platelets: 164 10*3/uL (ref 140–400)
RBC: 4.79 10*6/uL (ref 4.20–5.80)
RDW: 13.5 % (ref 11.0–15.0)
Total Lymphocyte: 25.7 %
WBC: 8.3 10*3/uL (ref 3.8–10.8)

## 2020-10-23 ENCOUNTER — Other Ambulatory Visit: Payer: Self-pay | Admitting: Cardiovascular Disease

## 2020-10-23 DIAGNOSIS — I5022 Chronic systolic (congestive) heart failure: Secondary | ICD-10-CM

## 2020-11-02 ENCOUNTER — Telehealth: Payer: Self-pay | Admitting: Internal Medicine

## 2020-11-02 NOTE — Telephone Encounter (Signed)
Patient requests to be called at ph# [phone] re: Status of [place] patient states he filled out and dropped off for Dr. Kelton Pillar on 10/23/20

## 2020-11-02 NOTE — Telephone Encounter (Signed)
Spoken to patient and inform him that I have faxed the application back to [place] with [facility] as requested on the fax. Patient then requested for me to fax it as well.  Faxed the application to [employer] as requested.

## 2020-11-05 NOTE — Telephone Encounter (Signed)
Received a fax that [employer] needed a call from the office   Discuss with the rep who complete the process of the application and created an order for a refill for patient to [facility].

## 2020-11-10 DIAGNOSIS — D4101 Neoplasm of uncertain behavior of right kidney: Secondary | ICD-10-CM | POA: Diagnosis not present

## 2020-11-10 DIAGNOSIS — Z8546 Personal history of malignant neoplasm of prostate: Secondary | ICD-10-CM | POA: Diagnosis not present

## 2020-11-16 ENCOUNTER — Encounter: Payer: Self-pay | Admitting: Cardiovascular Disease

## 2020-11-16 ENCOUNTER — Other Ambulatory Visit: Payer: Self-pay | Admitting: Urology

## 2020-11-16 ENCOUNTER — Other Ambulatory Visit: Payer: Self-pay

## 2020-11-16 ENCOUNTER — Ambulatory Visit (INDEPENDENT_AMBULATORY_CARE_PROVIDER_SITE_OTHER): Payer: Medicare Other

## 2020-11-16 ENCOUNTER — Other Ambulatory Visit (HOSPITAL_COMMUNITY): Payer: Self-pay | Admitting: Urology

## 2020-11-16 ENCOUNTER — Ambulatory Visit (INDEPENDENT_AMBULATORY_CARE_PROVIDER_SITE_OTHER): Payer: Medicare Other | Admitting: Cardiovascular Disease

## 2020-11-16 VITALS — BP 146/74 | HR 61 | Ht 66.0 in | Wt 243.6 lb

## 2020-11-16 DIAGNOSIS — E7800 Pure hypercholesterolemia, unspecified: Secondary | ICD-10-CM | POA: Diagnosis not present

## 2020-11-16 DIAGNOSIS — I255 Ischemic cardiomyopathy: Secondary | ICD-10-CM

## 2020-11-16 DIAGNOSIS — I480 Paroxysmal atrial fibrillation: Secondary | ICD-10-CM

## 2020-11-16 DIAGNOSIS — I10 Essential (primary) hypertension: Secondary | ICD-10-CM

## 2020-11-16 DIAGNOSIS — I2510 Atherosclerotic heart disease of native coronary artery without angina pectoris: Secondary | ICD-10-CM | POA: Diagnosis not present

## 2020-11-16 DIAGNOSIS — I4891 Unspecified atrial fibrillation: Secondary | ICD-10-CM | POA: Diagnosis not present

## 2020-11-16 DIAGNOSIS — D4101 Neoplasm of uncertain behavior of right kidney: Secondary | ICD-10-CM

## 2020-11-16 DIAGNOSIS — Z5181 Encounter for therapeutic drug level monitoring: Secondary | ICD-10-CM

## 2020-11-16 LAB — POCT INR: INR: 3.3 — AB (ref 2.0–3.0)

## 2020-11-16 NOTE — Patient Instructions (Signed)

## 2020-11-16 NOTE — Progress Notes (Signed)
Chief Complaint  Patient presents with  . Follow-up    CAD   History of Present Illness: 79 yo male with a history of atrial fibrillation, DM, HTN, HLD, CAD s/p CABG, prostate CA, renal insufficiency and obesity here today for cardiac follow up. He was seen as a new patient 02/12/14 for evaluation of atrial fibrillation and was placed on Xarelto. Stress test was abnormal with apical and inferolateral scar and ischemia, LVEF of 32%. Echo 02/17/14 with LVEF=35-40% with akinesis of the mid-distalanteroseptal and apical myocardium, MAC, dilated LA. Cardiac cath 02/21/14 with occluded left main, moderate RCA stenosis with the entire left system filling from right to left collaterals. 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 Echo 05/28/14 with LVEF=35-40%, concentric LVH, mild MR. He has done well following his bypass surgery.  Echo October 2020 with LVEF=50-55%, LVH, no significant valve disease.   He is here today for follow up. The patient denies any chest pain, dyspnea, palpitations, lower extremity edema, orthopnea, PND, dizziness, near syncope or syncope.   Primary Care Physician: Lauree Chandler, NP  Past Medical History:  Diagnosis Date  . Atrial fibrillation (HCC)    a. coumadin;  b. Amiodarone  . CAD (coronary artery disease)    a.  Lexiscan Myoview (02/14/14):  Apical, inferolateral scar; ? Mild ischemia; EF 32%.;  b.  LHC (02/21/14):  dLM 100%, LAD 100%, CFX 100%, dRCA 50%. => CABG (L-LAD, S-OM, S-PDA)  . Chronic systolic heart failure ([place])   . CKD (chronic kidney disease)    proteinuria  . Hemorrhage of gastrointestinal tract, unspecified   . Hemorrhage of rectum and anus   . HLD (hyperlipidemia)   . HTN (hypertension)   . Impotence of organic origin   . Ischemic cardiomyopathy    a. Echo (02/17/14):  EF 35-40%, anteroseptal and apical AK, Gr 2 DD, MAC, mod LAE.  [name] Nephrolithiasis   . Peripheral vascular disease, unspecified ([place])   . Prostate cancer  ([place])   . Thrombocytopenia, unspecified ([place])   . Tobacco use disorder   . Tobacco user 11/11/2009  . 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 BACK SURGERY    . CATARACT EXTRACTION  2010  . CLIPPING OF ATRIAL APPENDAGE Left 02/24/2014   Procedure: CLIPPING OF ATRIAL APPENDAGE;  Surgeon: Gaye Pollack, MD;  Location: [place];  Service: Open Heart Surgery;  Laterality: Left;  . COLONOSCOPY  2005   Normal  . CORONARY ARTERY BYPASS GRAFT N/A 02/24/2014   Procedure: Coronary artery bypass graft times three using left internal mammary artery and left leg greater saphenous vein harvested endoscopically.;  Surgeon: Gaye Pollack, MD;  [facility];  Service: Open Heart Surgery;  Laterality: N/A;  . INTRAOPERATIVE TRANSESOPHAGEAL ECHOCARDIOGRAM N/A 02/24/2014   Procedure: INTRAOPERATIVE TRANSESOPHAGEAL ECHOCARDIOGRAM;  Surgeon: Gaye Pollack, MD;  Location: [facility];  Service: Open Heart Surgery;  Laterality: N/A;  . LEFT HEART CATHETERIZATION WITH CORONARY ANGIOGRAM N/A 02/21/2014   Procedure: LEFT HEART CATHETERIZATION WITH CORONARY ANGIOGRAM;  Surgeon: Peter M Martinique, MD;  Location: [facility] CATH LAB;  Service: Cardiovascular;  Laterality: N/A;    Current Outpatient Medications  Medication Sig Dispense Refill  . amLODipine (NORVASC) 10 MG tablet Take 1 tablet (10 mg total) by mouth daily. 90 tablet 1  . aspirin EC 81 MG tablet Take 1 tablet (81 mg total)  by mouth daily.    [name] atorvastatin (LIPITOR) 40 MG tablet TAKE ONE TABLET BY MOUTH ONE TIME DAILY 90 tablet 3  . Calcium Carbonate-Vitamin D 600-200 MG-UNIT TABS Take 1 tablet by mouth daily. For bones     . carvedilol (COREG) 25 MG tablet Take 25 mg by mouth 2 (two) times daily with a meal.    . Flaxseed, Linseed, (FLAXSEED OIL PO) Take 1 tablet by mouth daily.     . furosemide (LASIX) 40 MG tablet TAKE TWO TABLETS BY MOUTH IN THE MORNING AND  ONE IN THE EVENING 90 tablet 0  . Insulin Lispro Prot & Lispro (HUMALOG MIX 75/25 KWIKPEN) (75-25) 100 UNIT/ML Kwikpen Inject 50 Units into the skin 2 (two) times daily with a meal. (Patient taking differently: Inject 30 Units into the skin 2 (two) times daily with a meal. ) 45 mL 3  . lisinopril (ZESTRIL) 40 MG tablet Take 1 tablet (40 mg total) by mouth 2 (two) times daily. 180 tablet 1  . metFORMIN (GLUCOPHAGE) 1000 MG tablet TAKE ONE TABLET BY MOUTH TWICE DAILY WITH MEAL 180 tablet 0  . Multiple Vitamin (MULTIVITAMINS PO) Take 1 tablet by mouth daily.      . potassium chloride SA (KLOR-CON) 20 MEQ tablet TAKE ONE TABLET BY MOUTH ONE TIME DAILY 90 tablet 1  . warfarin (COUMADIN) 5 MG tablet TAKE ONE AND ONE-HALF TABLETS BY MOUTH DAILY OR AS DIRECTED BY CLINIC 50 tablet 3   No current facility-administered medications for this visit.    No Known Allergies  Social History   Socioeconomic History  . Marital status: Married    Spouse name: Not on file  . Number of children: 3  . Years of education: Not on file  . Highest education level: Not on file  Occupational History  . Occupation: Manufacturing engineer buildings  Tobacco Use  . Smoking status: Never Smoker  . Smokeless tobacco: Former Systems developer    Types: Secondary school teacher  . Vaping Use: Never used  Substance and Sexual Activity  . Alcohol use: No  . Drug use: No  . Sexual activity: Never  Other Topics Concern  . Not on file  Social History Narrative  . Not on file   Social Determinants of Health   Financial Resource Strain:   . Difficulty of Paying Living Expenses: Not on file  Food Insecurity:   . Worried About Charity fundraiser in the Last Year: Not on file  . Ran Out of Food in the Last Year: Not on file  Transportation Needs:   . Lack of Transportation (Medical): Not on file  . Lack of Transportation (Non-Medical): Not on file  Physical Activity:   . Days of Exercise per Week: Not on file  . Minutes of Exercise per  Session: Not on file  Stress:   . Feeling of Stress : Not on file  Social Connections:   . Frequency of Communication with Friends and Family: Not on file  . Frequency of Social Gatherings with Friends and Family: Not on file  . Attends Religious Services: Not on file  . Active Member of Clubs or Organizations: Not on file  . Attends Archivist Meetings: Not on file  . Marital Status: Not on file  Intimate Partner Violence:   . Fear of Current or Ex-Partner: Not on file  . Emotionally Abused: Not on file  . Physically Abused: Not on file  . Sexually Abused: Not on file  Family History  Problem Relation Age of Onset  . Cancer Mother 6       leukemia  . Heart disease Father   . Heart attack Father 32  . Lung cancer Brother   . Colon cancer Neg Hx   . Rectal cancer Neg Hx     Review of Systems:  As stated in the HPI and otherwise negative.   BP (!) 146/74   Pulse 61   Ht _0  (1.676 m)   Wt 243 lb 9.6 oz (110.5 kg)   SpO2 97%   BMI 39.32 kg/m   Physical Examination:  General: Well developed, well nourished, NAD  HEENT: OP clear, mucus membranes moist  SKIN: warm, dry. No rashes. Neuro: No focal deficits  Musculoskeletal: Muscle strength 5/5 all ext  Psychiatric: Mood and affect normal  Neck: No JVD, no carotid bruits, no thyromegaly, no lymphadenopathy.  Lungs:Clear bilaterally, no wheezes, rhonci, crackles Cardiovascular: Regular rate and rhythm. No murmurs, gallops or rubs. Abdomen:Soft. Bowel sounds present. Non-tender.  Extremities: No lower extremity edema. Pulses are 2 + in the bilateral DP/PT.  Echo October 2020:  1. Left ventricular ejection fraction, by visual estimation, is 50 to  55%. The left ventricle has normal function. There is moderately increased  left ventricular hypertrophy.  2. Left ventricular diastolic Doppler parameters are consistent with  impaired relaxation pattern of LV diastolic filling.  3. Global right ventricle  has normal systolic function.The right  ventricular size is normal. No increase in right ventricular wall  thickness.  4. Left atrial size was normal.  5. Right atrial size was mildly dilated.  6. The mitral valve is normal in structure. No evidence of mitral valve  regurgitation.  7. The tricuspid valve is normal in structure. Tricuspid valve  regurgitation was not visualized by color flow Doppler.  8. The aortic valve is tricuspid Aortic valve regurgitation was not  visualized by color flow Doppler. Mild to moderate aortic valve  sclerosis/calcification without any evidence of aortic stenosis.  9. The pulmonic valve was grossly normal. Pulmonic valve regurgitation is  mild by color flow Doppler.  10. The atrial septum is grossly normal.    EKG:  EKG is ordered today. The ekg ordered today demonstrates  Sinus, T wave inversion lateral leads. Unchanged  Recent Labs: 10/21/2020: ALT 17; BUN 17; Creat 1.01; Hemoglobin 13.6; Platelets 164; Potassium 4.0; Sodium 142   Lipid Panel    Component Value Date/Time   CHOL 107 06/17/2020 1337   CHOL 101 06/08/2016 0843   TRIG 118 06/17/2020 1337   HDL 38 (L) 06/17/2020 1337   HDL 37 (L) 06/08/2016 0843   CHOLHDL 2.8 06/17/2020 1337   VLDL 23 07/21/2017 0835   LDLCALC 49 06/17/2020 1337     Wt Readings from Last 3 Encounters:  11/16/20 243 lb 9.6 oz (110.5 kg)  10/21/20 243 lb 6.4 oz (110.4 kg)  08/10/20 242 lb 9.6 oz (110 kg)     Other studies Reviewed: Additional studies/ records that were reviewed today include: . Review of the above records demonstrates:    Assessment and Plan:   1. CAD s/p CABG without angina: No chest pain. Will continue ASA, beta blocker and statin.      2. Atrial fibrillation, paroxysmal: He is in sinus today. Will continue beta blocker and coumadin.    3. Ischemic cardiomyopathy: LVEF=50-55% by echo October 2020. Will continue beta blocker and Ace-inh.    4. Chronic systolic CHF: Weight is  stable. No  volume overload on exam. Will continue Lasix.   5. HTN: BP controlled. Continue current therapy  6. HLD: LDL 49 June 2021. Continue statin  Current medicines are reviewed at length with the patient today.  The patient does not have concerns regarding medicines.  The following changes have been made:  no change  Labs/ tests ordered today include:   Orders Placed This Encounter  Procedures  . EKG 12-Lead    Disposition:   FU with me in 12 months  Signed, Lauree Chandler, MD 11/16/2020 4:07 PM    [facility] [address], [place], [place]  [address] Phone: [phone]; Fax: [phone]

## 2020-11-16 NOTE — Patient Instructions (Signed)
Description   Skip today's dosage of Warfarin, then start taking 1.5 tablets daily except 1 tablet on Mondays.  Recheck in 4 weeks. Call with any new medications [phone].

## 2020-11-26 ENCOUNTER — Ambulatory Visit (HOSPITAL_COMMUNITY)
Admission: RE | Admit: 2020-11-26 | Discharge: 2020-11-26 | Disposition: A | Discharge status: Home / Self Care | Payer: Medicare Other | Source: Ambulatory Visit | Attending: Urology | Admitting: Urology

## 2020-11-26 ENCOUNTER — Other Ambulatory Visit: Payer: Self-pay

## 2020-11-26 DIAGNOSIS — D4101 Neoplasm of uncertain behavior of right kidney: Secondary | ICD-10-CM

## 2020-11-26 DIAGNOSIS — K5730 Diverticulosis of large intestine without perforation or abscess without bleeding: Secondary | ICD-10-CM | POA: Diagnosis not present

## 2020-11-26 DIAGNOSIS — N281 Cyst of kidney, acquired: Secondary | ICD-10-CM | POA: Diagnosis not present

## 2020-11-26 DIAGNOSIS — C61 Malignant neoplasm of prostate: Secondary | ICD-10-CM | POA: Diagnosis not present

## 2020-11-26 DIAGNOSIS — N2889 Other specified disorders of kidney and ureter: Secondary | ICD-10-CM | POA: Diagnosis not present

## 2020-11-26 MED ORDER — GADOBUTROL 1 MMOL/ML IV SOLN
9.0000 mL | Freq: Once | INTRAVENOUS | Status: AC | PRN
  Administered 2020-11-26: 9 mL via INTRAVENOUS

## 2020-12-01 ENCOUNTER — Other Ambulatory Visit: Payer: Self-pay | Admitting: Internal Medicine

## 2020-12-01 ENCOUNTER — Other Ambulatory Visit: Payer: Self-pay | Admitting: Cardiovascular Disease

## 2020-12-07 ENCOUNTER — Encounter: Payer: Self-pay | Admitting: Internal Medicine

## 2020-12-07 ENCOUNTER — Ambulatory Visit (INDEPENDENT_AMBULATORY_CARE_PROVIDER_SITE_OTHER): Payer: Medicare Other | Admitting: Internal Medicine

## 2020-12-07 ENCOUNTER — Other Ambulatory Visit: Payer: Self-pay

## 2020-12-07 VITALS — BP 130/78 | HR 66 | Ht 66.0 in | Wt 244.4 lb

## 2020-12-07 DIAGNOSIS — Z794 Long term (current) use of insulin: Secondary | ICD-10-CM

## 2020-12-07 DIAGNOSIS — E1143 Type 2 diabetes mellitus with diabetic autonomic (poly)neuropathy: Secondary | ICD-10-CM | POA: Diagnosis not present

## 2020-12-07 DIAGNOSIS — E1129 Type 2 diabetes mellitus with other diabetic kidney complication: Secondary | ICD-10-CM | POA: Diagnosis not present

## 2020-12-07 DIAGNOSIS — R809 Proteinuria, unspecified: Secondary | ICD-10-CM

## 2020-12-07 DIAGNOSIS — E1165 Type 2 diabetes mellitus with hyperglycemia: Secondary | ICD-10-CM

## 2020-12-07 LAB — POCT GLYCOSYLATED HEMOGLOBIN (HGB A1C): Hemoglobin A1C: 9.3 % — AB (ref 4.0–5.6)

## 2020-12-07 MED ORDER — INSULIN LISPRO PROT & LISPRO (75-25 MIX) 100 UNIT/ML KWIKPEN
PEN_INJECTOR | SUBCUTANEOUS | 3 refills | Status: DC
Start: 2020-12-07 — End: 2021-08-15

## 2020-12-07 NOTE — Progress Notes (Signed)
Name: Seth [name]  Age/ Sex: 79 y.o., male   MRN/ DOB: [id number], 07-13-1941     PCP: Seth Chandler, NP   Reason for Endocrinology Evaluation: Type 2 Diabetes Mellitus  Initial Endocrine Consultative Visit: 10/09/2019    PATIENT IDENTIFIER: Seth [name] is a 79 y.o. male with a past medical history of T2DM, HTN, PVD, CAD, A.Fib and Prostate Ca ( S/P chemo and radiation). The patient has followed with Endocrinology clinic since 10/09/2019 for consultative assistance with management of his diabetes.      DIABETIC HISTORY:  Seth [name] was diagnosed with T2DM many years ago.  He was prescribed Jardiance in the past, but unable to use due to cost.  hemoglobin A1c has ranged from 6.4% in 2015, peaking at 11.7% in 2020 On his initial visit to our clinic his A1c was 9.7%, he was on glipizide, Toujeo, and metformin.  We switched his Toujeo to Humalog mix, we stopped glipizide and continued Metformin.  Lives with wife  SUBJECTIVE:   During the last visit (08/10/2020): A1c 7.7 %.  We increased Humalog mix and continued Metformin  Today (12/07/2020): Seth [name] is here for his 36-monthfollow-up on diabetes management.  He checks his blood sugars to times daily, preprandial to breakfast and bedtime. The patient has not had hypoglycemic episodes since the last clinic visit.   Denies sob, nausea or diarrhea   HOME DIABETES REGIMEN:  Humalog Mix 34 units with Breakfast and 30 units with Supper  Metformin 1000 mg BID     Statin: Yes ACE-I/ARB: Yes    GLUCOSE LOG:  172-324  mg/dL     DIABETIC COMPLICATIONS: Microvascular complications:   Neuropathy   Denies: CKD, retinopathy   Last eye exam: Completed 12/2019  Macrovascular complications:   CAD, PVD  Denies: CVA   HISTORY:  Past Medical History:  Past Medical History:  Diagnosis Date  . Atrial fibrillation (HCC)    a. coumadin;  b. Amiodarone  . CAD (coronary artery disease)    a.  Lexiscan Myoview  (02/14/14):  Apical, inferolateral scar; ? Mild ischemia; EF 32%.;  b.  LHC (02/21/14):  dLM 100%, LAD 100%, CFX 100%, dRCA 50%. => CABG (L-LAD, S-OM, S-PDA)  . Chronic systolic heart failure ([place]   . CKD (chronic kidney disease)    proteinuria  . Hemorrhage of gastrointestinal tract, unspecified   . Hemorrhage of rectum and anus   . HLD (hyperlipidemia)   . HTN (hypertension)   . Impotence of organic origin   . Ischemic cardiomyopathy    a. Echo (02/17/14):  EF 35-40%, anteroseptal and apical AK, Gr 2 DD, MAC, mod LAE.  .Marland KitchenNephrolithiasis   . Peripheral vascular disease, unspecified ([place]   . Prostate cancer ([place]   . Thrombocytopenia, unspecified ([place]   . Tobacco use disorder   . Tobacco user 11/11/2009  . 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 BACK SURGERY    . CATARACT EXTRACTION  2010  . CLIPPING OF ATRIAL APPENDAGE Left 02/24/2014   Procedure: CLIPPING OF ATRIAL APPENDAGE;  Surgeon: BGaye Pollack MD;  [facility]  Service: Open Heart Surgery;  Laterality: Left;  . COLONOSCOPY  2005   Normal  . CORONARY ARTERY BYPASS GRAFT N/A 02/24/2014   Procedure: Coronary artery bypass graft times three using left internal mammary artery and left leg greater saphenous vein  harvested endoscopically.;  Surgeon: Gaye Pollack, MD;  Location: [facility];  Service: Open Heart Surgery;  Laterality: N/A;  . INTRAOPERATIVE TRANSESOPHAGEAL ECHOCARDIOGRAM N/A 02/24/2014   Procedure: INTRAOPERATIVE TRANSESOPHAGEAL ECHOCARDIOGRAM;  Surgeon: Gaye Pollack, MD;  Location: [facility];  Service: Open Heart Surgery;  Laterality: N/A;  . LEFT HEART CATHETERIZATION WITH CORONARY ANGIOGRAM N/A 02/21/2014   Procedure: LEFT HEART CATHETERIZATION WITH CORONARY ANGIOGRAM;  Surgeon: Peter M Martinique, MD;  Location: [facility] CATH LAB;  Service: Cardiovascular;  Laterality: N/A;    Social History:   reports that he has never smoked. He quit smokeless tobacco use about 6 years ago.  His smokeless tobacco use included chew. He reports that he does not drink alcohol and does not use drugs. Family History:  Family History  Problem Relation Age of Onset  . Cancer Mother 20       leukemia  . Heart disease Father   . Heart attack Father 33  . Lung cancer Brother   . Colon cancer Neg Hx   . Rectal cancer Neg Hx      HOME MEDICATIONS: Allergies as of 12/07/2020   No Known Allergies     Medication List       Accurate as of December 07, 2020 12:58 PM. If you have any questions, ask your nurse or doctor.        amLODipine 10 MG tablet Commonly known as: NORVASC Take 1 tablet (10 mg total) by mouth daily.   aspirin EC 81 MG tablet Take 1 tablet (81 mg total) by mouth daily.   atorvastatin 40 MG tablet Commonly known as: LIPITOR TAKE ONE TABLET BY MOUTH ONE TIME DAILY   Calcium Carbonate-Vitamin D 600-200 MG-UNIT Tabs Take 1 tablet by mouth daily. For bones   carvedilol 25 MG tablet Commonly known as: COREG Take 25 mg by mouth 2 (two) times daily with a meal.   FLAXSEED OIL PO Take 1 tablet by mouth daily.   furosemide 40 MG tablet Commonly known as: LASIX TAKE TWO TABLETS BY MOUTH IN THE MORNING AND ONE IN THE EVENING   Insulin Lispro Prot & Lispro (75-25) 100 UNIT/ML Kwikpen Commonly known as: HumaLOG Mix 75/25 KwikPen Inject 50 Units into the skin 2 (two) times daily with a meal. What changed: how much to take   lisinopril 40 MG tablet Commonly known as: ZESTRIL Take 1 tablet (40 mg total) by mouth 2 (two) times daily.   metFORMIN 1000 MG tablet Commonly known as: GLUCOPHAGE TAKE ONE TABLET BY MOUTH TWICE DAILY WITH MEAL   MULTIVITAMINS PO Take 1 tablet by mouth daily.   potassium chloride SA 20 MEQ tablet Commonly known as: KLOR-CON TAKE ONE TABLET BY MOUTH ONE TIME DAILY   warfarin 5 MG tablet Commonly known as: COUMADIN Take as directed by the  anticoagulation clinic. If you are unsure how to take this medication, talk to your nurse or doctor. Original instructions: TAKE ONE AND ONE-HALF TABLETS BY MOUTH DAILY OR AS DIRECTED BY CLINIC        OBJECTIVE:   Vital Signs: BP 130/78   Pulse 66   Ht _0  (1.676 m)   Wt 244 lb 6 oz (110.8 kg)   SpO2 98%   BMI 39.44 kg/m   Wt Readings from Last 3 Encounters:  12/07/20 244 lb 6 oz (110.8 kg)  11/16/20 243 lb 9.6 oz (110.5 kg)  10/21/20 243 lb 6.4 oz (110.4 kg)     Exam: General: Pt appears well and  is in NAD  Lungs: Clear with good BS bilat with no rales, rhonchi, or wheezes  Heart: RRR with normal S1 and S2 and no gallops; no murmurs; no rub  Extremities:  No pretibial edema..  Neuro: MS is good with appropriate affect, pt is alert and Ox3       DM foot exam: 04/08/2020  The skin of the feet is intact without sores or ulcerations. The pedal pulses are 2+ on right and 2+ on left. The sensation is absent to a screening 5.07, 10 gram monofilament bilaterally   DATA REVIEWED:  Lab Results  Component Value Date   HGBA1C 7.7 (A) 08/10/2020   HGBA1C 12.5 (A) 04/08/2020   HGBA1C 12.3 (A) 01/09/2020   Lab Results  Component Value Date   MICROALBUR 34.1 11/02/2018   LDLCALC 49 06/17/2020   CREATININE 1.01 10/21/2020   Lab Results  Component Value Date   MICRALBCREAT 897 (H) 11/02/2018     Lab Results  Component Value Date   CHOL 107 06/17/2020   HDL 38 (L) 06/17/2020   LDLCALC 49 06/17/2020   TRIG 118 06/17/2020   CHOLHDL 2.8 06/17/2020         ASSESSMENT / PLAN / RECOMMENDATIONS:   1) Type 2 Diabetes Mellitus, Poorly  controlled, With neuropathic, micro albumin urea and macrovascular complications - Most recent A1c of 9.3 %. Goal A1c <7.0%.  - His A1c increased again., he continues with hyperglycemia. Has imperfect  adherence with evening dose  - I have offered Jardiance, with pt assistance program but he would like to try continuing on current  regimen until next time  - Discussed cardiovascular benefits  Of Jardiance  -  New prescription was faxed to [place] has no medicare plan D   Plan: MEDICATIONS:  Increase Humalog mix to 40 units before breakfast and 36 units with  supper  Continue Metformin 1000 mg twice daily  EDUCATION / INSTRUCTIONS:  BG monitoring instructions: Patient is instructed to check his blood sugars to times a day, before breakfast and supper.  Call [facility] if: BG persistently < 70  . I reviewed the Rule of 15 for the treatment of hypoglycemia in detail with the patient. Literature supplied.    F/U in 4 months   Signed electronically by: Mack Guise, MD  [facility]  [facility] [address] [place], [place] [address] Phone: [phone] FAX: [phone]   CC: Seth Chandler, NP [place] [address] Phone: [phone]  Fax: [phone]  Return to Endocrinology clinic as below: Future Appointments  Date Time Provider [place]  12/07/2020  1:00 PM Johnni Wunschel, Melanie Crazier, MD [facility] None  12/14/2020  1:00 PM [facility]  04/19/2021 10:00 AM [facility] None  04/23/2021  1:00 PM Seth Chandler, NP [facility] None  06/21/2021  1:00 PM Seth Chandler, NP [facility] None

## 2020-12-07 NOTE — Patient Instructions (Signed)
-   INcrease Humalog Mix to 40 units with  Breakfast and 36 units with  Supper  - Continue Metformin 1 tablet with Breakfast and Supper       HOW TO TREAT LOW BLOOD SUGARS (Blood sugar LESS THAN 70 MG/DL)  Please follow the RULE OF 15 for the treatment of hypoglycemia treatment (when your (blood sugars are less than 70 mg/dL)    STEP 1: Take 15 grams of carbohydrates when your blood sugar is low, which includes:   3-4 GLUCOSE TABS  OR  3-4 OZ OF JUICE OR REGULAR SODA OR  ONE TUBE OF GLUCOSE GEL     STEP 2: RECHECK blood sugar in 15 MINUTES STEP 3: If your blood sugar is still low at the 15 minute recheck --> then, go back to STEP 1 and treat AGAIN with another 15 grams of carbohydrates.

## 2020-12-13 ENCOUNTER — Encounter: Payer: Self-pay | Admitting: Internal Medicine

## 2020-12-14 ENCOUNTER — Ambulatory Visit (INDEPENDENT_AMBULATORY_CARE_PROVIDER_SITE_OTHER): Payer: Medicare Other

## 2020-12-14 ENCOUNTER — Other Ambulatory Visit: Payer: Self-pay

## 2020-12-14 DIAGNOSIS — Z5181 Encounter for therapeutic drug level monitoring: Secondary | ICD-10-CM | POA: Diagnosis not present

## 2020-12-14 DIAGNOSIS — I4891 Unspecified atrial fibrillation: Secondary | ICD-10-CM

## 2020-12-14 LAB — POCT INR: INR: 2.9 (ref 2.0–3.0)

## 2020-12-14 NOTE — Patient Instructions (Signed)
Description   Continue on same dosage 1.5 tablets daily except 1 tablet on Mondays.  Recheck in 4 weeks. Call with any new medications [phone].

## 2021-01-15 ENCOUNTER — Ambulatory Visit (INDEPENDENT_AMBULATORY_CARE_PROVIDER_SITE_OTHER): Payer: Medicare Other | Admitting: *Deleted

## 2021-01-15 ENCOUNTER — Other Ambulatory Visit: Payer: Self-pay

## 2021-01-15 DIAGNOSIS — I4891 Unspecified atrial fibrillation: Secondary | ICD-10-CM

## 2021-01-15 DIAGNOSIS — Z5181 Encounter for therapeutic drug level monitoring: Secondary | ICD-10-CM

## 2021-01-15 LAB — POCT INR: INR: 3 (ref 2.0–3.0)

## 2021-01-15 NOTE — Patient Instructions (Signed)
Description   Continue on same dosage 1.5 tablets daily except 1 tablet on Mondays.  Recheck in 5 weeks. Call with any new medications [phone].

## 2021-01-18 ENCOUNTER — Other Ambulatory Visit: Payer: Self-pay | Admitting: Nurse Practitioner

## 2021-01-18 DIAGNOSIS — I5022 Chronic systolic (congestive) heart failure: Secondary | ICD-10-CM

## 2021-01-18 NOTE — Telephone Encounter (Signed)
RX last filled by cardiologist, I will send to Lauree Chandler, NP to confirm ok for [place] to refill.

## 2021-02-02 ENCOUNTER — Other Ambulatory Visit: Payer: Self-pay | Admitting: Nurse Practitioner

## 2021-02-02 DIAGNOSIS — I5022 Chronic systolic (congestive) heart failure: Secondary | ICD-10-CM

## 2021-02-04 ENCOUNTER — Other Ambulatory Visit: Payer: Self-pay | Admitting: Nurse Practitioner

## 2021-02-04 ENCOUNTER — Other Ambulatory Visit: Payer: Self-pay | Admitting: Cardiovascular Disease

## 2021-02-04 DIAGNOSIS — I5022 Chronic systolic (congestive) heart failure: Secondary | ICD-10-CM

## 2021-02-15 ENCOUNTER — Other Ambulatory Visit: Payer: Self-pay

## 2021-02-15 ENCOUNTER — Ambulatory Visit (INDEPENDENT_AMBULATORY_CARE_PROVIDER_SITE_OTHER): Payer: Medicare Other | Admitting: *Deleted

## 2021-02-15 DIAGNOSIS — I4891 Unspecified atrial fibrillation: Secondary | ICD-10-CM | POA: Diagnosis not present

## 2021-02-15 DIAGNOSIS — Z5181 Encounter for therapeutic drug level monitoring: Secondary | ICD-10-CM | POA: Diagnosis not present

## 2021-02-15 LAB — POCT INR: INR: 2.5 (ref 2.0–3.0)

## 2021-02-15 NOTE — Patient Instructions (Signed)
Description   Continue taking Warfarin 1.5 tablets daily except 1 tablet on Mondays.  Recheck in 6 weeks. Call with any new medications [phone].

## 2021-03-27 DIAGNOSIS — R9721 Rising PSA following treatment for malignant neoplasm of prostate: Secondary | ICD-10-CM | POA: Diagnosis not present

## 2021-03-29 ENCOUNTER — Ambulatory Visit (INDEPENDENT_AMBULATORY_CARE_PROVIDER_SITE_OTHER): Payer: Medicare Other | Admitting: *Deleted

## 2021-03-29 ENCOUNTER — Other Ambulatory Visit: Payer: Self-pay

## 2021-03-29 DIAGNOSIS — Z5181 Encounter for therapeutic drug level monitoring: Secondary | ICD-10-CM

## 2021-03-29 DIAGNOSIS — I4891 Unspecified atrial fibrillation: Secondary | ICD-10-CM | POA: Diagnosis not present

## 2021-03-29 DIAGNOSIS — R9721 Rising PSA following treatment for malignant neoplasm of prostate: Secondary | ICD-10-CM | POA: Diagnosis not present

## 2021-03-29 LAB — POCT INR: INR: 3.3 — AB (ref 2.0–3.0)

## 2021-03-29 NOTE — Patient Instructions (Signed)
Description   Do not take any Warfarin today then continue taking the dose you have been taking which Warfarin 1.5 tablets daily. Recheck in 4 weeks. Call with any new medications [phone].

## 2021-04-05 DIAGNOSIS — Z8546 Personal history of malignant neoplasm of prostate: Secondary | ICD-10-CM | POA: Diagnosis not present

## 2021-04-05 DIAGNOSIS — R9721 Rising PSA following treatment for malignant neoplasm of prostate: Secondary | ICD-10-CM | POA: Diagnosis not present

## 2021-04-05 DIAGNOSIS — R3121 Asymptomatic microscopic hematuria: Secondary | ICD-10-CM | POA: Diagnosis not present

## 2021-04-05 DIAGNOSIS — N281 Cyst of kidney, acquired: Secondary | ICD-10-CM | POA: Diagnosis not present

## 2021-04-07 ENCOUNTER — Ambulatory Visit (INDEPENDENT_AMBULATORY_CARE_PROVIDER_SITE_OTHER): Payer: Medicare Other | Admitting: Internal Medicine

## 2021-04-07 ENCOUNTER — Other Ambulatory Visit: Payer: Self-pay

## 2021-04-07 ENCOUNTER — Encounter: Payer: Self-pay | Admitting: Internal Medicine

## 2021-04-07 VITALS — BP 136/86 | HR 65 | Ht 66.0 in | Wt 253.1 lb

## 2021-04-07 DIAGNOSIS — R809 Proteinuria, unspecified: Secondary | ICD-10-CM | POA: Diagnosis not present

## 2021-04-07 DIAGNOSIS — E1165 Type 2 diabetes mellitus with hyperglycemia: Secondary | ICD-10-CM | POA: Diagnosis not present

## 2021-04-07 DIAGNOSIS — Z794 Long term (current) use of insulin: Secondary | ICD-10-CM

## 2021-04-07 DIAGNOSIS — E1143 Type 2 diabetes mellitus with diabetic autonomic (poly)neuropathy: Secondary | ICD-10-CM | POA: Diagnosis not present

## 2021-04-07 DIAGNOSIS — E1129 Type 2 diabetes mellitus with other diabetic kidney complication: Secondary | ICD-10-CM

## 2021-04-07 LAB — POCT GLYCOSYLATED HEMOGLOBIN (HGB A1C): Hemoglobin A1C: 8.1 % — AB (ref 4.0–5.6)

## 2021-04-07 MED ORDER — EMPAGLIFLOZIN 10 MG PO TABS: 10.0000 mg | ORAL_TABLET | Freq: Every day | ORAL | 1 refills | Status: DC

## 2021-04-07 NOTE — Patient Instructions (Addendum)
-   Continue  Humalog Mix to 40 units with  Breakfast and  36 units with  Supper  - Continue Metformin 1 tablet with Breakfast and Supper  - Start Jardiance 10 mg, 1 tablet in the morning      HOW TO TREAT LOW BLOOD SUGARS (Blood sugar LESS THAN 70 MG/DL)  Please follow the RULE OF 15 for the treatment of hypoglycemia treatment (when your (blood sugars are less than 70 mg/dL)    STEP 1: Take 15 grams of carbohydrates when your blood sugar is low, which includes:   3-4 GLUCOSE TABS  OR  3-4 OZ OF JUICE OR REGULAR SODA OR  ONE TUBE OF GLUCOSE GEL     STEP 2: RECHECK blood sugar in 15 MINUTES STEP 3: If your blood sugar is still low at the 15 minute recheck --> then, go back to STEP 1 and treat AGAIN with another 15 grams of carbohydrates.

## 2021-04-07 NOTE — Progress Notes (Signed)
Name: Seth [name]  Age/ Sex: 80 y.o., male   MRN/ DOB: [id number], [birth year]     PCP: Seth Chandler, NP   Reason for Endocrinology Evaluation: Type 2 Diabetes Mellitus  Initial Endocrine Consultative Visit: 10/09/2019    PATIENT IDENTIFIER: [name] is a 80 y.o. male with a past medical history of T2DM, HTN, PVD, CAD, A.Fib and Prostate Ca ( S/P chemo and radiation). The patient has followed with Endocrinology clinic since 10/09/2019 for consultative assistance with management of his diabetes.      DIABETIC HISTORY:  Seth [name] was diagnosed with T2DM many years ago.  He was prescribed Jardiance in the past, but unable to use due to cost.  hemoglobin A1c has ranged from 6.4% in 2015, peaking at 11.7% in 2020 On his initial visit to our clinic his A1c was 9.7%, he was on glipizide, Toujeo, and metformin.  We switched his Toujeo to Humalog mix, we stopped glipizide and continued Metformin.  Lives with wife  SUBJECTIVE:   During the last visit (12/07/2020): A1c 9.3%.  We increased Humalog mix and continued Metformin  Today (04/07/2021): Seth [name] is here for a follow-up on diabetes management.  He checks his blood sugars 2  times daily, preprandial to breakfast and bedtime. The patient has not had hypoglycemic episodes since the last clinic visit.   Denies sob, nausea or diarrhea     HOME DIABETES REGIMEN:  Humalog Mix 40 units with Breakfast and 36 units with Supper  Metformin 1000 mg BID     Statin: Yes ACE-I/ARB: Yes    GLUCOSE LOG:  137- 260 mg/dL     DIABETIC COMPLICATIONS: Microvascular complications:   Neuropathy   Denies: CKD, retinopathy   Last eye exam: Completed 12/2019  Macrovascular complications:   CAD, PVD  Denies: CVA   HISTORY:  Past Medical History:  Past Medical History:  Diagnosis Date  . Atrial fibrillation (HCC)    a. coumadin;  b. Amiodarone  . CAD (coronary artery disease)    a.  Lexiscan Myoview  (02/14/14):  Apical, inferolateral scar; ? Mild ischemia; EF 32%.;  b.  LHC (02/21/14):  dLM 100%, LAD 100%, CFX 100%, dRCA 50%. => CABG (L-LAD, S-OM, S-PDA)  . Chronic systolic heart failure ([place])   . CKD (chronic kidney disease)    proteinuria  . Hemorrhage of gastrointestinal tract, unspecified   . Hemorrhage of rectum and anus   . HLD (hyperlipidemia)   . HTN (hypertension)   . Impotence of organic origin   . Ischemic cardiomyopathy    a. Echo (02/17/14):  EF 35-40%, anteroseptal and apical AK, Gr 2 DD, MAC, mod LAE.  [name] Nephrolithiasis   . Peripheral vascular disease, unspecified ([place])   . Prostate cancer ([place])   . Thrombocytopenia, unspecified ([place])   . Tobacco use disorder   . Tobacco user 11/11/2009  . 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 BACK SURGERY    . CATARACT EXTRACTION  2010  . CLIPPING OF ATRIAL APPENDAGE Left 02/24/2014   Procedure: CLIPPING OF ATRIAL APPENDAGE;  Surgeon: Gaye Pollack, MD;  Location: [place];  Service: Open Heart Surgery;  Laterality: Left;  . COLONOSCOPY  2005   Normal  . CORONARY ARTERY BYPASS GRAFT N/A 02/24/2014   Procedure: Coronary artery bypass graft times three using left internal mammary artery and left leg greater saphenous  vein harvested endoscopically.;  Surgeon: Gaye Pollack, MD;  Location: [facility] OR;  Service: Open Heart Surgery;  Laterality: N/A;  . INTRAOPERATIVE TRANSESOPHAGEAL ECHOCARDIOGRAM N/A 02/24/2014   Procedure: INTRAOPERATIVE TRANSESOPHAGEAL ECHOCARDIOGRAM;  Surgeon: Gaye Pollack, MD;  Location: [place] OR;  Service: Open Heart Surgery;  Laterality: N/A;  . LEFT HEART CATHETERIZATION WITH CORONARY ANGIOGRAM N/A 02/21/2014   Procedure: LEFT HEART CATHETERIZATION WITH CORONARY ANGIOGRAM;  Surgeon: Peter M Martinique, MD;  Location: [facility] CATH LAB;  Service: Cardiovascular;  Laterality: N/A;    Social History:   reports that he has never smoked. He quit smokeless tobacco use about 7 years ago.  His smokeless tobacco use included chew. He reports that he does not drink alcohol and does not use drugs. Family History:  Family History  Problem Relation Age of Onset  . Cancer Mother 4       leukemia  . Heart disease Father   . Heart attack Father 8  . Lung cancer Brother   . Colon cancer Neg Hx   . Rectal cancer Neg Hx      HOME MEDICATIONS: Allergies as of 04/07/2021   No Known Allergies     Medication List       Accurate as of April 07, 2021  1:22 PM. If you have any questions, ask your nurse or doctor.        amLODipine 10 MG tablet Commonly known as: NORVASC Take 1 tablet (10 mg total) by mouth daily.   aspirin EC 81 MG tablet Take 1 tablet (81 mg total) by mouth daily.   atorvastatin 40 MG tablet Commonly known as: LIPITOR TAKE ONE TABLET BY MOUTH ONE TIME DAILY   Calcium Carbonate-Vitamin D 600-200 MG-UNIT Tabs Take 1 tablet by mouth daily. For bones   carvedilol 25 MG tablet Commonly known as: COREG Take 25 mg by mouth 2 (two) times daily with a meal.   FLAXSEED OIL PO Take 1 tablet by mouth daily.   furosemide 40 MG tablet Commonly known as: LASIX TAKE 2 TABLETS BY MOUTH IN THE MORNING AND 1 TABLET IN THE EVENING   Insulin Lispro Prot & Lispro (75-25) 100 UNIT/ML Kwikpen Commonly known as: HumaLOG Mix 75/25 KwikPen Inject 40 Units into the skin daily with breakfast AND 36 Units daily with supper.   lisinopril 40 MG tablet Commonly known as: ZESTRIL TAKE ONE TABLET BY MOUTH TWICE DAILY   metFORMIN 1000 MG tablet Commonly known as: GLUCOPHAGE TAKE ONE TABLET BY MOUTH TWICE DAILY WITH MEAL   MULTIVITAMINS PO Take 1 tablet by mouth daily.   potassium chloride SA 20 MEQ tablet Commonly known as: KLOR-CON TAKE ONE TABLET BY MOUTH ONE TIME DAILY   warfarin 5 MG tablet Commonly known as: COUMADIN Take as directed by the anticoagulation clinic. If you are  unsure how to take this medication, talk to your nurse or doctor. Original instructions: TAKE ONE AND ONE-HALF TABLETS BY MOUTH DAILY OR AS DIRECTED BY CLINIC        OBJECTIVE:   Vital Signs: BP 136/86   Pulse 65   Ht _0  (1.676 m)   Wt 253 lb 2 oz (114.8 kg)   SpO2 92%   BMI 40.86 kg/m   Wt Readings from Last 3 Encounters:  04/07/21 253 lb 2 oz (114.8 kg)  12/07/20 244 lb 6 oz (110.8 kg)  11/16/20 243 lb 9.6 oz (110.5 kg)     Exam: General: Pt appears well and is in NAD  Lungs: Clear  with good BS bilat with no rales, rhonchi, or wheezes  Heart: RRR with normal S1 and S2 and no gallops; no murmurs; no rub  Extremities:  No pretibial edema..  Neuro: MS is good with appropriate affect, pt is alert and Ox3       DM foot exam: 04/07/2021  The skin of the feet is  without sores or ulcerations. The pedal pulses are 1+ on right and 1+ on left. The sensation is absent to a screening 5.07, 10 gram monofilament bilaterally   DATA REVIEWED:  Lab Results  Component Value Date   HGBA1C 8.1 (A) 04/07/2021   HGBA1C 9.3 (A) 12/07/2020   HGBA1C 7.7 (A) 08/10/2020   Lab Results  Component Value Date   MICROALBUR 34.1 11/02/2018   LDLCALC 49 06/17/2020   CREATININE 1.01 10/21/2020   Lab Results  Component Value Date   MICRALBCREAT 897 (H) 11/02/2018     Lab Results  Component Value Date   CHOL 107 06/17/2020   HDL 38 (L) 06/17/2020   LDLCALC 49 06/17/2020   TRIG 118 06/17/2020   CHOLHDL 2.8 06/17/2020         ASSESSMENT / PLAN / RECOMMENDATIONS:   1) Type 2 Diabetes Mellitus, Sub-Optimally  controlled, With neuropathic, micro albumin urea and macrovascular complications - Most recent A1c of 8.1 %. Goal A1c <7.0%.  - A1c trending down - He has been noted with hyperglycemia during the fay and acceptable BG's fasting  - Discussed adding SGLT-2 inhibitors, discussed cardiovascular benefits  Of Jardiance  - New application for Jardiance has been provided  - Pt  has no medicare plan D   MEDICATIONS:  Continue  Humalog mix  40 units before breakfast and 36 units with  supper  Continue Metformin 1000 mg twice daily  Start Jardiance 10 mg daily   EDUCATION / INSTRUCTIONS:  BG monitoring instructions: Patient is instructed to check his blood sugars to times a day, before breakfast and supper.  Call [facility] if: BG persistently < 70  . I reviewed the Rule of 15 for the treatment of hypoglycemia in detail with the patient. Literature supplied.    F/U in 4 months   Signed electronically by: Mack Guise, MD  [facility]  [facility] [address] [place], [place] [address] Phone: [phone] FAX: [phone]   CC: Seth Chandler, NP [place] [address] Phone: [phone]  Fax: [phone]  Return to Endocrinology clinic as below: Future Appointments  Date Time Provider [place]  04/13/2021 10:00 AM [facility] None  04/16/2021  1:00 PM Seth Chandler, NP [facility] None  04/27/2021  2:00 PM [facility]  06/21/2021  1:00 PM Seth Chandler, NP [facility] None

## 2021-04-13 ENCOUNTER — Other Ambulatory Visit: Payer: Self-pay

## 2021-04-13 ENCOUNTER — Telehealth: Payer: Self-pay | Admitting: Internal Medicine

## 2021-04-13 ENCOUNTER — Other Ambulatory Visit: Payer: Self-pay | Admitting: Internal Medicine

## 2021-04-13 ENCOUNTER — Other Ambulatory Visit: Payer: Self-pay | Admitting: Cardiovascular Disease

## 2021-04-13 ENCOUNTER — Other Ambulatory Visit: Payer: Medicare Other

## 2021-04-13 ENCOUNTER — Other Ambulatory Visit: Payer: Self-pay | Admitting: Nurse Practitioner

## 2021-04-13 DIAGNOSIS — I25708 Atherosclerosis of coronary artery bypass graft(s), unspecified, with other forms of angina pectoris: Secondary | ICD-10-CM | POA: Diagnosis not present

## 2021-04-13 DIAGNOSIS — I5022 Chronic systolic (congestive) heart failure: Secondary | ICD-10-CM

## 2021-04-13 DIAGNOSIS — I10 Essential (primary) hypertension: Secondary | ICD-10-CM | POA: Diagnosis not present

## 2021-04-13 DIAGNOSIS — E1169 Type 2 diabetes mellitus with other specified complication: Secondary | ICD-10-CM

## 2021-04-13 DIAGNOSIS — D696 Thrombocytopenia, unspecified: Secondary | ICD-10-CM | POA: Diagnosis not present

## 2021-04-13 DIAGNOSIS — E785 Hyperlipidemia, unspecified: Secondary | ICD-10-CM | POA: Diagnosis not present

## 2021-04-13 NOTE — Telephone Encounter (Signed)
Noted  

## 2021-04-13 NOTE — Telephone Encounter (Signed)
pt brought paper work for patient assistance program application completed by patient. Placed in Providers mail box at front desk.

## 2021-04-14 LAB — COMPLETE METABOLIC PANEL WITH GFR
AG Ratio: 1.4 (calc) (ref 1.0–2.5)
ALT: 13 U/L (ref 9–46)
AST: 13 U/L (ref 10–35)
Albumin: 3.8 g/dL (ref 3.6–5.1)
Alkaline phosphatase (APISO): 62 U/L (ref 35–144)
BUN: 20 mg/dL (ref 7–25)
CO2: 32 mmol/L (ref 20–32)
Calcium: 9.2 mg/dL (ref 8.6–10.3)
Chloride: 106 mmol/L (ref 98–110)
Creat: 1.05 mg/dL (ref 0.70–1.18)
GFR, Est African American: 78 mL/min/{1.73_m2} (ref >=60)
GFR, Est Non African American: 68 mL/min/{1.73_m2} (ref >=60)
Globulin: 2.8 g/dL (calc) (ref 1.9–3.7)
Glucose, Bld: 229 mg/dL — ABNORMAL HIGH (ref 65–99)
Potassium: 4.8 mmol/L (ref 3.5–5.3)
Sodium: 144 mmol/L (ref 135–146)
Total Bilirubin: 0.5 mg/dL (ref 0.2–1.2)
Total Protein: 6.6 g/dL (ref 6.1–8.1)

## 2021-04-14 LAB — CBC WITH DIFFERENTIAL/PLATELET
Absolute Monocytes: 848 cells/uL (ref 200–950)
Basophils Absolute: 48 cells/uL (ref 0–200)
Basophils Relative: 0.6 %
Eosinophils Absolute: 416 cells/uL (ref 15–500)
Eosinophils Relative: 5.2 %
HCT: 39.2 % (ref 38.5–50.0)
Hemoglobin: 12.3 g/dL — ABNORMAL LOW (ref 13.2–17.1)
Lymphs Abs: 1728 cells/uL (ref 850–3900)
MCH: 26.6 pg — ABNORMAL LOW (ref 27.0–33.0)
MCHC: 31.4 g/dL — ABNORMAL LOW (ref 32.0–36.0)
MCV: 84.7 fL (ref 80.0–100.0)
MPV: 10.5 fL (ref 7.5–12.5)
Monocytes Relative: 10.6 %
Neutro Abs: 4960 cells/uL (ref 1500–7800)
Neutrophils Relative %: 62 %
Platelets: 163 10*3/uL (ref 140–400)
RBC: 4.63 10*6/uL (ref 4.20–5.80)
RDW: 13.6 % (ref 11.0–15.0)
Total Lymphocyte: 21.6 %
WBC: 8 10*3/uL (ref 3.8–10.8)

## 2021-04-14 LAB — LIPID PANEL
Cholesterol: 93 mg/dL (ref <200)
HDL: 38 mg/dL — ABNORMAL LOW (ref >=40)
LDL Cholesterol (Calc): 38 mg/dL (calc)
Non-HDL Cholesterol (Calc): 55 mg/dL (calc) (ref <130)
Total CHOL/HDL Ratio: 2.4 (calc) (ref <5.0)
Triglycerides: 86 mg/dL (ref <150)

## 2021-04-16 ENCOUNTER — Encounter: Payer: Self-pay | Admitting: Nurse Practitioner

## 2021-04-16 ENCOUNTER — Ambulatory Visit (INDEPENDENT_AMBULATORY_CARE_PROVIDER_SITE_OTHER): Payer: Medicare Other | Admitting: Nurse Practitioner

## 2021-04-16 ENCOUNTER — Other Ambulatory Visit: Payer: Self-pay

## 2021-04-16 VITALS — BP 136/68 | HR 68 | Temp 97.7°F | Ht 66.0 in | Wt 253.2 lb

## 2021-04-16 DIAGNOSIS — L989 Disorder of the skin and subcutaneous tissue, unspecified: Secondary | ICD-10-CM

## 2021-04-16 DIAGNOSIS — Z6841 Body Mass Index (BMI) 40.0 and over, adult: Secondary | ICD-10-CM

## 2021-04-16 DIAGNOSIS — I5022 Chronic systolic (congestive) heart failure: Secondary | ICD-10-CM | POA: Diagnosis not present

## 2021-04-16 DIAGNOSIS — E785 Hyperlipidemia, unspecified: Secondary | ICD-10-CM

## 2021-04-16 DIAGNOSIS — Z794 Long term (current) use of insulin: Secondary | ICD-10-CM | POA: Diagnosis not present

## 2021-04-16 DIAGNOSIS — E1169 Type 2 diabetes mellitus with other specified complication: Secondary | ICD-10-CM

## 2021-04-16 DIAGNOSIS — I480 Paroxysmal atrial fibrillation: Secondary | ICD-10-CM | POA: Diagnosis not present

## 2021-04-16 DIAGNOSIS — I739 Peripheral vascular disease, unspecified: Secondary | ICD-10-CM

## 2021-04-16 DIAGNOSIS — E1143 Type 2 diabetes mellitus with diabetic autonomic (poly)neuropathy: Secondary | ICD-10-CM

## 2021-04-16 DIAGNOSIS — I25708 Atherosclerosis of coronary artery bypass graft(s), unspecified, with other forms of angina pectoris: Secondary | ICD-10-CM

## 2021-04-16 DIAGNOSIS — D696 Thrombocytopenia, unspecified: Secondary | ICD-10-CM | POA: Diagnosis not present

## 2021-04-16 NOTE — Progress Notes (Signed)
Careteam: Patient Care Team: Seth Chandler, NP as PCP - General (Geriatric Medicine) [name], MD as PCP - Cardiology (Cardiology) [name], MD as Attending Physician (Urology) [name], MD as Consulting Physician (Nephrology) [name], OD (Optometry)  PLACE OF SERVICE:  [facility] Directive information    No Known Allergies  Chief Complaint  Patient presents with  . Medical Management of Chronic Issues    Patient returns to the office for 6 month follow up. Patient has a sore on his back he would like looked at. He has not started the [place].   [name] Health Maintenance    Patient plans to schedule his eye exam soon. No fourth covid booster as of yet.      HPI: Patient is a 80 y.o. male for routine follow up.   Followed by endocrinology- getting jardiance approved. Not much feeling in his feet.   Weight is up- went to [place] and "had a big time" went to a bunch of restaurants and enjoyed all the different types of fish. Was eating 3 good meals a day  Hyperlipidemia- controlled on lipitor 40 mg daily  A fib- rate controlled, going to coumadin clinic for INR  Unable to exercise due to the weather. All weather bothers him. Hot, cold, wind. Not exercising on treadmill anymore. Used to do daily  Reports he had to stop wearing his suspenders about 6 months ago due to making a tender place on his back. His wife has been monitoring area and wanted him to have it evaluated. Has not healed.   Review of Systems:  Review of Systems  Constitutional: Negative for chills, fever and weight loss.  HENT: Negative for tinnitus.   Respiratory: Negative for cough, sputum production and shortness of breath.   Cardiovascular: Negative for chest pain, palpitations and leg swelling.  Gastrointestinal: Negative for abdominal pain, constipation, diarrhea and heartburn.  Genitourinary: Negative for dysuria, frequency and urgency.   Musculoskeletal: Negative for back pain, falls, joint pain and myalgias.  Skin: Negative.        Irritation/wound to back  Neurological: Positive for sensory change. Negative for dizziness and headaches.  Psychiatric/Behavioral: Negative for depression and memory loss. The patient does not have insomnia.     Past Medical History:  Diagnosis Date  . Atrial fibrillation (HCC)    a. coumadin;  b. Amiodarone  . CAD (coronary artery disease)    a.  Lexiscan Myoview (02/14/14):  Apical, inferolateral scar; ? Mild ischemia; EF 32%.;  b.  LHC (02/21/14):  dLM 100%, LAD 100%, CFX 100%, dRCA 50%. => CABG (L-LAD, S-OM, S-PDA)  . Chronic systolic heart failure ([place])   . CKD (chronic kidney disease)    proteinuria  . Hemorrhage of gastrointestinal tract, unspecified   . Hemorrhage of rectum and anus   . HLD (hyperlipidemia)   . HTN (hypertension)   . Impotence of organic origin   . Ischemic cardiomyopathy    a. Echo (02/17/14):  EF 35-40%, anteroseptal and apical AK, Gr 2 DD, MAC, mod LAE.  [name] Nephrolithiasis   . Peripheral vascular disease, unspecified ([place])   . Prostate cancer ([place])   . Thrombocytopenia, unspecified ([place])   . Tobacco use disorder   . Tobacco user 11/11/2009  . Type II or unspecified type diabetes mellitus with renal manifestations, uncontrolled(250.42)    Past Surgical History:  Procedure Laterality Date  . BACK SURGERY    . CATARACT EXTRACTION  2010  . CLIPPING OF  ATRIAL APPENDAGE Left 02/24/2014   Procedure: CLIPPING OF ATRIAL APPENDAGE;  Surgeon: Gaye Pollack, MD;  Location: [place];  Service: Open Heart Surgery;  Laterality: Left;  . COLONOSCOPY  2005   Normal  . CORONARY ARTERY BYPASS GRAFT N/A 02/24/2014   Procedure: Coronary artery bypass graft times three using left internal mammary artery and left leg greater saphenous vein harvested endoscopically.;  Surgeon: Gaye Pollack, MD;  [facility];  Service: Open Heart Surgery;  Laterality: N/A;  . INTRAOPERATIVE  TRANSESOPHAGEAL ECHOCARDIOGRAM N/A 02/24/2014   Procedure: INTRAOPERATIVE TRANSESOPHAGEAL ECHOCARDIOGRAM;  Surgeon: Gaye Pollack, MD;  Location: [place] OR;  Service: Open Heart Surgery;  Laterality: N/A;  . LEFT HEART CATHETERIZATION WITH CORONARY ANGIOGRAM N/A 02/21/2014   Procedure: LEFT HEART CATHETERIZATION WITH CORONARY ANGIOGRAM;  Surgeon: Peter M Martinique, MD;  Location[facility] C[facility] LAB;  Service: Cardiovascular;  Laterality: N/A;   Social History:   reports that he has never smoked. He quit smokeless tobacco use about 7 years ago.  His smokeless tobacco use included chew. He reports that he does not drink alcohol and does not use drugs.  Family History  Problem Relation Age of Onset  . Cancer Mother 93       leukemia  . Heart disease Father   . Heart attack Father 35  . Lung cancer Brother   . Colon cancer Neg Hx   . Rectal cancer Neg Hx     Medications: Patient's Medications  New Prescriptions   No medications on file  Previous Medications   AMLODIPINE (NORVASC) 10 MG TABLET    Take 1 tablet (10 mg total) by mouth daily.   ASPIRIN EC 81 MG TABLET    Take 1 tablet (81 mg total) by mouth daily.   ATORVASTATIN (LIPITOR) 40 MG TABLET    TAKE ONE TABLET BY MOUTH ONE TIME DAILY   CALCIUM CARBONATE-VITAMIN D 600-200 MG-UNIT TABS    Take 1 tablet by mouth daily. For bones   CARVEDILOL (COREG) 25 MG TABLET    Take 25 mg by mouth 2 (two) times daily with a meal.   EMPAGLIFLOZIN (JARDIANCE) 10 MG TABS TABLET    Take 1 tablet (10 mg total) by mouth daily before breakfast.   FLAXSEED, LINSEED, (FLAXSEED OIL PO)    Take 1 tablet by mouth daily.   FUROSEMIDE (LASIX) 40 MG TABLET    TAKE 2 TABLETS BY MOUTH IN THE MORNING AND 1 TABLET IN THE EVENING   INSULIN LISPRO PROT & LISPRO (HUMALOG MIX 75/25 KWIKPEN) (75-25) 100 UNIT/ML KWIKPEN    Inject 40 Units into the skin daily with breakfast AND 36 Units daily with supper.   LISINOPRIL (ZESTRIL) 40 MG TABLET    TAKE ONE TABLET BY MOUTH TWICE DAILY    METFORMIN (GLUCOPHAGE) 1000 MG TABLET    TAKE ONE TABLET BY MOUTH TWICE DAILY WITH MEAL   MULTIPLE VITAMIN (MULTIVITAMINS PO)    Take 1 tablet by mouth daily.   POTASSIUM CHLORIDE SA (KLOR-CON) 20 MEQ TABLET    TAKE ONE TABLET BY MOUTH ONE TIME DAILY   WARFARIN (COUMADIN) 5 MG TABLET    TAKE ONE AND ONE-HALF TABLETS BY MOUTH DAILY OR AS DIRECTED BY CLINIC  Modified Medications   No medications on file  Discontinued Medications   No medications on file    Physical Exam:  Vitals:   04/16/21 1252  BP: 136/68  Pulse: 68  Temp: 97.7 F (36.5 C)  SpO2: 96%  Weight: 253 lb  3.2 oz (114.9 kg)  Height: _0  (1.676 m)   Body mass index is 40.87 kg/m. Wt Readings from Last 3 Encounters:  04/16/21 253 lb 3.2 oz (114.9 kg)  04/07/21 253 lb 2 oz (114.8 kg)  12/07/20 244 lb 6 oz (110.8 kg)    Physical Exam Constitutional:      General: He is not in acute distress.    Appearance: He is well-developed. He is obese. He is not diaphoretic.  HENT:     Head: Normocephalic and atraumatic.     Mouth/Throat:     Pharynx: No oropharyngeal exudate.  Eyes:     Conjunctiva/sclera: Conjunctivae normal.     Pupils: Pupils are equal, round, and reactive to light.  Cardiovascular:     Rate and Rhythm: Normal rate and regular rhythm.     Heart sounds: Normal heart sounds.  Pulmonary:     Effort: Pulmonary effort is normal.     Breath sounds: Normal breath sounds.  Abdominal:     General: Abdomen is protuberant. Bowel sounds are normal.     Palpations: Abdomen is soft.  Musculoskeletal:        General: No tenderness.     Cervical back: Normal range of motion and neck supple.  Skin:    General: Skin is warm and dry.     Comments: 3 x 3 cm raised crusting bleeding rash to right upper back. No heat, redness noted. Cleaned with saline and dressing applied.   Neurological:     Mental Status: He is alert and oriented to person, place, and time.    Labs reviewed: Basic Metabolic Panel: Recent  Labs    06/17/20 1337 10/21/20 1334 04/13/21 0947  NA 142 142 144  K 4.0 4.0 4.8  CL 107 104 106  CO2 26 30 32  GLUCOSE 102 201* 229*  BUN _1 CREATININE 0.89 1.01 1.05  CALCIUM 9.3 9.3 9.2   Liver Function Tests: Recent Labs    06/17/20 1337 10/21/20 1334 04/13/21 0947  AST _2 ALT _3 BILITOT 0.6 0.5 0.5  PROT 6.4 6.6 6.6   No results for input(s): LIPASE, AMYLASE in the last 8760 hours. No results for input(s): AMMONIA in the last 8760 hours. CBC: Recent Labs    06/17/20 1337 10/21/20 1334 04/13/21 0947  WBC 7.4 8.3 8.0  NEUTROABS 4,447 4,507 4,960  HGB 14.2 13.6 12.3*  HCT 43.5 40.3 39.2  MCV 84.1 84.1 84.7  PLT 167 164 163   Lipid Panel: Recent Labs    06/17/20 1337 04/13/21 0947  CHOL 107 93  HDL 38* 38*  LDLCALC 49 38  TRIG 118 86  CHOLHDL 2.8 2.4   TSH: No results for input(s): TSH in the last 8760 hours. A1C: Lab Results  Component Value Date   HGBA1C 8.1 (A) 04/07/2021     Assessment/Plan 1. Skin lesion of back Large raised nonhealing lesion to back- needing further evaluation at this time - Ambulatory referral to Dermatology for further workup and treatment.  2. Chronic systolic congestive heart failure (HCC) -stable on current regimen, encouraged dietary compliance and weight loss. Continue current medicaitons       3. PVD (peripheral vascular disease) (HCC) Stable, without increase in pain to LE, continues on ASA 81 mg daily   4. Paroxysmal atrial fibrillation (HCC) Rate controlled on coreg, continues on coumadin 5 mg daily   5. Class 3 severe obesity due to excess calories with serious  comorbidity and body mass index (BMI) of 40.0 to 44.9 in adult ([place]) -weight gain noted and education provided on healthy weight loss through increase in physical activity and proper nutrition   6. Thrombocytopenia (HCC) Platelet count stable at this time.   7. Coronary artery disease of bypass graft of native heart with  stable angina pectoris (HCC) Stable, without chest pain. Continues on lipitor, asa and coreg.   8. Type 2 diabetes mellitus with diabetic autonomic neuropathy, with long-term current use of insulin ([place]) -uncontrolled. He is followed by endocrinology, plan to start jardiance in addition to humalog and metformin.  Stressed importance of diet and nutrition. Encouraged him to get moving again.  -Encouraged routine foot care/monitoring and to keep up with diabetic eye exams through ophthalmology   9. Hyperlipidemia associated with type 2 diabetes mellitus ([place]) -continues on lipitor. LDL at goal at this time. Encouraged and educated on low fat diet.   Next appt: 6 months [facility] [place] [facility] [place], [facility] [phone]

## 2021-04-16 NOTE — Patient Instructions (Addendum)
If you do not hear back about referral in a week please call our office back and ask to speak with Seth [name] (our referral coordinator) for an update.  Continue to work on nutrition- dietary modifications for diabetes  Increase physical activity- 30 mins 5 days a week.   DUE for Tdap vaccine - to get at local pharmacy

## 2021-04-19 ENCOUNTER — Other Ambulatory Visit: Payer: Medicare Other

## 2021-04-23 ENCOUNTER — Ambulatory Visit: Payer: Medicare Other | Admitting: Nurse Practitioner

## 2021-04-23 NOTE — Telephone Encounter (Signed)
Received a fax from [employer] patient assistance program that patient reported income exceeds current eligibility limits.   They are unable to enroll patient in the program.  This letter have also been mail out to patient as well.

## 2021-04-27 ENCOUNTER — Other Ambulatory Visit: Payer: Self-pay

## 2021-04-27 ENCOUNTER — Ambulatory Visit (INDEPENDENT_AMBULATORY_CARE_PROVIDER_SITE_OTHER): Payer: Medicare Other | Admitting: *Deleted

## 2021-04-27 DIAGNOSIS — I4891 Unspecified atrial fibrillation: Secondary | ICD-10-CM | POA: Diagnosis not present

## 2021-04-27 DIAGNOSIS — Z5181 Encounter for therapeutic drug level monitoring: Secondary | ICD-10-CM

## 2021-04-27 LAB — POCT INR: INR: 2.4 (ref 2.0–3.0)

## 2021-04-27 NOTE — Patient Instructions (Signed)
Description   Continue taking Warfarin 1.5 tablets daily. Recheck in 4 weeks. Call with any new medications [phone].

## 2021-05-26 DIAGNOSIS — Z23 Encounter for immunization: Secondary | ICD-10-CM | POA: Diagnosis not present

## 2021-06-02 ENCOUNTER — Other Ambulatory Visit: Payer: Self-pay

## 2021-06-02 ENCOUNTER — Ambulatory Visit (INDEPENDENT_AMBULATORY_CARE_PROVIDER_SITE_OTHER): Payer: Medicare Other | Admitting: *Deleted

## 2021-06-02 DIAGNOSIS — I4891 Unspecified atrial fibrillation: Secondary | ICD-10-CM | POA: Diagnosis not present

## 2021-06-02 DIAGNOSIS — Z5181 Encounter for therapeutic drug level monitoring: Secondary | ICD-10-CM

## 2021-06-02 LAB — POCT INR: INR: 3.9 — AB (ref 2.0–3.0)

## 2021-06-02 MED ORDER — WARFARIN SODIUM 5 MG PO TABS
ORAL_TABLET | ORAL | 0 refills | Status: DC
Start: 2021-06-02 — End: 2021-10-06

## 2021-06-02 NOTE — Patient Instructions (Signed)
Description   Do not take any Warfarin today then continue taking Warfarin 1.5 tablets daily. Recheck in 3 weeks. Call with any new medications [phone].

## 2021-06-21 ENCOUNTER — Telehealth: Payer: Self-pay

## 2021-06-21 ENCOUNTER — Encounter: Payer: Self-pay | Admitting: Nurse Practitioner

## 2021-06-21 ENCOUNTER — Other Ambulatory Visit: Payer: Self-pay

## 2021-06-21 ENCOUNTER — Ambulatory Visit (INDEPENDENT_AMBULATORY_CARE_PROVIDER_SITE_OTHER): Payer: Medicare Other | Admitting: Nurse Practitioner

## 2021-06-21 VITALS — Ht 66.0 in | Wt 256.0 lb

## 2021-06-21 DIAGNOSIS — Z0000 Encounter for general adult medical examination without abnormal findings: Secondary | ICD-10-CM

## 2021-06-21 NOTE — Progress Notes (Signed)
   This service is provided via telemedicine  No vital signs collected/recorded due to the encounter was a telemedicine visit.   Location of patient (ex: home, work):  Home  Patient consents to a telephone visit: Yes, see telephone visit dated 06/21/2021  Location of the provider (ex: office, home):  [facility], Office   Name of any referring provider:  N/A  Names of all persons participating in the telemedicine service and their role in the encounter:  Marin Roberts, [place], Sherrie Mustache, NP, and Patient   Time spent on call:  9 min with medical assistant

## 2021-06-21 NOTE — Patient Instructions (Signed)
Seth [name] , Thank you for taking time to come for your Medicare Wellness Visit. I appreciate your ongoing commitment to your health goals. Please review the following plan we discussed and let me know if I can assist you in the future.   Screening recommendations/referrals: Colonoscopy no longer required Recommended yearly ophthalmology/optometry visit for glaucoma screening and checkup Recommended yearly dental visit for hygiene and checkup  Vaccinations: Influenza vaccine up to date Pneumococcal vaccine up to date Tdap vaccine RECOMMENDED- to get at your local pharmacy Shingles vaccine na    Advanced directives: to bring to next follow up so we can place on file.   Conditions/risks identified: advanced age, complications related to obesity, diabetes.   Next appointment: 1 year   Preventive Care 79 Years and Older, Male Preventive care refers to lifestyle choices and visits with your health care provider that can promote health and wellness. What does preventive care include? A yearly physical exam. This is also called an annual well check. Dental exams once or twice a year. Routine eye exams. Ask your health care provider how often you should have your eyes checked. Personal lifestyle choices, including: Daily care of your teeth and gums. Regular physical activity. Eating a healthy diet. Avoiding tobacco and drug use. Limiting alcohol use. Practicing safe sex. Taking low doses of aspirin every day. Taking vitamin and mineral supplements as recommended by your health care provider. What happens during an annual well check? The services and screenings done by your health care provider during your annual well check will depend on your age, overall health, lifestyle risk factors, and family history of disease. Counseling  Your health care provider may ask you questions about your: Alcohol use. Tobacco use. Drug use. Emotional well-being. Home and relationship  well-being. Sexual activity. Eating habits. History of falls. Memory and ability to understand (cognition). Work and work Statistician. Screening  You may have the following tests or measurements: Height, weight, and BMI. Blood pressure. Lipid and cholesterol levels. These may be checked every 5 years, or more frequently if you are over 77 years old. Skin check. Lung cancer screening. You may have this screening every year starting at age 18 if you have a 30-pack-year history of smoking and currently smoke or have quit within the past 15 years. Fecal occult blood test (FOBT) of the stool. You may have this test every year starting at age 16. Flexible sigmoidoscopy or colonoscopy. You may have a sigmoidoscopy every 5 years or a colonoscopy every 10 years starting at age 7. Prostate cancer screening. Recommendations will vary depending on your family history and other risks. Hepatitis C blood test. Hepatitis B blood test. Sexually transmitted disease (STD) testing. Diabetes screening. This is done by checking your blood sugar (glucose) after you have not eaten for a while (fasting). You may have this done every 1-3 years. Abdominal aortic aneurysm (AAA) screening. You may need this if you are a current or former smoker. Osteoporosis. You may be screened starting at age 7 if you are at high risk. Talk with your health care provider about your test results, treatment options, and if necessary, the need for more tests. Vaccines  Your health care provider may recommend certain vaccines, such as: Influenza vaccine. This is recommended every year. Tetanus, diphtheria, and acellular pertussis (Tdap, Td) vaccine. You may need a Td booster every 10 years. Zoster vaccine. You may need this after age 10. Pneumococcal 13-valent conjugate (PCV13) vaccine. One dose is recommended after age 39. Pneumococcal polysaccharide (  PPSV23) vaccine. One dose is recommended after age 60. Talk to your health care  provider about which screenings and vaccines you need and how often you need them. This information is not intended to replace advice given to you by your health care provider. Make sure you discuss any questions you have with your health care provider. Document Released: 01/08/2016 Document Revised: 08/31/2016 Document Reviewed: 10/13/2015 Elsevier Interactive Patient Education  2017 [place] Prevention in the Home Falls can cause injuries. They can happen to people of all ages. There are many things you can do to make your home safe and to help prevent falls. What can I do on the outside of my home? Regularly fix the edges of walkways and driveways and fix any cracks. Remove anything that might make you trip as you walk through a door, such as a raised step or threshold. Trim any bushes or trees on the path to your home. Use bright outdoor lighting. Clear any walking paths of anything that might make someone trip, such as rocks or tools. Regularly check to see if handrails are loose or broken. Make sure that both sides of any steps have handrails. Any raised decks and porches should have guardrails on the edges. Have any leaves, snow, or ice cleared regularly. Use sand or salt on walking paths during winter. Clean up any spills in your garage right away. This includes oil or grease spills. What can I do in the bathroom? Use night lights. Install grab bars by the toilet and in the tub and shower. Do not use towel bars as grab bars. Use non-skid mats or decals in the tub or shower. If you need to sit down in the shower, use a plastic, non-slip stool. Keep the floor dry. Clean up any water that spills on the floor as soon as it happens. Remove soap buildup in the tub or shower regularly. Attach bath mats securely with double-sided non-slip rug tape. Do not have throw rugs and other things on the floor that can make you trip. What can I do in the bedroom? Use night lights. Make  sure that you have a light by your bed that is easy to reach. Do not use any sheets or blankets that are too big for your bed. They should not hang down onto the floor. Have a firm chair that has side arms. You can use this for support while you get dressed. Do not have throw rugs and other things on the floor that can make you trip. What can I do in the kitchen? Clean up any spills right away. Avoid walking on wet floors. Keep items that you use a lot in easy-to-reach places. If you need to reach something above you, use a strong step stool that has a grab bar. Keep electrical cords out of the way. Do not use floor polish or wax that makes floors slippery. If you must use wax, use non-skid floor wax. Do not have throw rugs and other things on the floor that can make you trip. What can I do with my stairs? Do not leave any items on the stairs. Make sure that there are handrails on both sides of the stairs and use them. Fix handrails that are broken or loose. Make sure that handrails are as long as the stairways. Check any carpeting to make sure that it is firmly attached to the stairs. Fix any carpet that is loose or worn. Avoid having throw rugs at the top or  bottom of the stairs. If you do have throw rugs, attach them to the floor with carpet tape. Make sure that you have a light switch at the top of the stairs and the bottom of the stairs. If you do not have them, ask someone to add them for you. What else can I do to help prevent falls? Wear shoes that: Do not have high heels. Have rubber bottoms. Are comfortable and fit you well. Are closed at the toe. Do not wear sandals. If you use a stepladder: Make sure that it is fully opened. Do not climb a closed stepladder. Make sure that both sides of the stepladder are locked into place. Ask someone to hold it for you, if possible. Clearly mark and make sure that you can see: Any grab bars or handrails. First and last steps. Where the  edge of each step is. Use tools that help you move around (mobility aids) if they are needed. These include: Canes. Walkers. Scooters. Crutches. Turn on the lights when you go into a dark area. Replace any light bulbs as soon as they burn out. Set up your furniture so you have a clear path. Avoid moving your furniture around. If any of your floors are uneven, fix them. If there are any pets around you, be aware of where they are. Review your medicines with your doctor. Some medicines can make you feel dizzy. This can increase your chance of falling. Ask your doctor what other things that you can do to help prevent falls. This information is not intended to replace advice given to you by your health care provider. Make sure you discuss any questions you have with your health care provider. Document Released: 10/08/2009 Document Revised: 05/19/2016 Document Reviewed: 01/16/2015 Elsevier Interactive Patient Education  2017 [employer].

## 2021-06-21 NOTE — Progress Notes (Signed)
Subjective:   Seth [name] is a 80 y.o. male who presents for Medicare Annual/Subsequent preventive examination.  Review of Systems           Objective:    Today's Vitals   06/21/21 1247  Weight: 256 lb (116.1 kg)  Height: _0  (1.676 m)   Body mass index is 41.32 kg/m.  Advanced Directives 06/17/2020 06/17/2020 10/11/2019 08/30/2019 06/11/2019 05/23/2019 05/09/2019  Does Patient Have a Medical Advance Directive? _1  Yes Yes  Type of Advance Directive - [place];Living will [place];Living will [place];Living will [place];Living will [place];Living will [place];Living will  Does patient want to make changes to medical advance directive? No - Patient declined No - Guardian declined No - Patient declined No - Patient declined No - Patient declined No - Patient declined No - Patient declined  Copy of [place] in Chart? No - copy requested No - copy requested No - copy requested No - copy requested No - copy requested No - copy requested No - copy requested  Would patient like information on creating a medical advance directive? - - - - - - -  Pre-existing out of facility DNR order (yellow form or pink MOST form) - - - - - - -    Current Medications (verified) Outpatient Encounter Medications as of 06/21/2021  Medication Sig   amLODipine (NORVASC) 10 MG tablet Take 1 tablet (10 mg total) by mouth daily.   aspirin EC 81 MG tablet Take 1 tablet (81 mg total) by mouth daily.   atorvastatin (LIPITOR) 40 MG tablet TAKE ONE TABLET BY MOUTH ONE TIME DAILY   Calcium Carbonate-Vitamin D 600-200 MG-UNIT TABS Take 1 tablet by mouth daily. For bones   carvedilol (COREG) 25 MG tablet Take 25 mg by mouth 2 (two) times daily with a meal.   empagliflozin (JARDIANCE) 10 MG TABS tablet Take 1 tablet (10 mg total) by mouth daily before breakfast.    Flaxseed, Linseed, (FLAXSEED OIL PO) Take 1 tablet by mouth daily.   furosemide (LASIX) 40 MG tablet TAKE 2 TABLETS BY MOUTH IN THE MORNING AND 1 TABLET IN THE EVENING   Insulin Lispro Prot & Lispro (HUMALOG MIX 75/25 KWIKPEN) (75-25) 100 UNIT/ML Kwikpen Inject 40 Units into the skin daily with breakfast AND 36 Units daily with supper.   lisinopril (ZESTRIL) 40 MG tablet TAKE ONE TABLET BY MOUTH TWICE DAILY   metFORMIN (GLUCOPHAGE) 1000 MG tablet TAKE ONE TABLET BY MOUTH TWICE DAILY WITH MEAL   Multiple Vitamin (MULTIVITAMINS PO) Take 1 tablet by mouth daily.   potassium chloride SA (KLOR-CON) 20 MEQ tablet TAKE ONE TABLET BY MOUTH ONE TIME DAILY   warfarin (COUMADIN) 5 MG tablet Take 1 and 1/2 tablets daily by mouth or as directed by Anticoagulation Clinic.   No facility-administered encounter medications on file as of 06/21/2021.    Allergies (verified) Patient has no known allergies.   History: Past Medical History:  Diagnosis Date   Atrial fibrillation ([place])    a. coumadin;  b. Amiodarone   CAD (coronary artery disease)    a.  Lexiscan Myoview (02/14/14):  Apical, inferolateral scar; ? Mild ischemia; EF 32%.;  b.  LHC (02/21/14):  dLM 100%, LAD 100%, CFX 100%, dRCA 50%. => CABG (L-LAD, S-OM, S-PDA)   Chronic systolic heart failure (HCC)    CKD (chronic kidney disease)  proteinuria   Hemorrhage of gastrointestinal tract, unspecified    Hemorrhage of rectum and anus    HLD (hyperlipidemia)    HTN (hypertension)    Impotence of organic origin    Ischemic cardiomyopathy    a. Echo (02/17/14):  EF 35-40%, anteroseptal and apical AK, Gr 2 DD, MAC, mod LAE.   Nephrolithiasis    Peripheral vascular disease, unspecified ([place])    Prostate cancer ([place])    Thrombocytopenia, unspecified ([place])    Tobacco use disorder    Tobacco user 11/11/2009   Type II or unspecified type diabetes mellitus with renal manifestations, uncontrolled(250.42)    Past Surgical History:  Procedure Laterality  Date   BACK SURGERY     CATARACT EXTRACTION  2010   CLIPPING OF ATRIAL APPENDAGE Left 02/24/2014   Procedure: CLIPPING OF ATRIAL APPENDAGE;  Surgeon: Gaye Pollack, MD;  Location: [facility];  Service: Open Heart Surgery;  Laterality: Left;   COLONOSCOPY  2005   Normal   CORONARY ARTERY BYPASS GRAFT N/A 02/24/2014   Procedure: Coronary artery bypass graft times three using left internal mammary artery and left leg greater saphenous vein harvested endoscopically.;  Surgeon: Gaye Pollack, MD;  Location: [facility];  Service: Open Heart Surgery;  Laterality: N/A;   INTRAOPERATIVE TRANSESOPHAGEAL ECHOCARDIOGRAM N/A 02/24/2014   Procedure: INTRAOPERATIVE TRANSESOPHAGEAL ECHOCARDIOGRAM;  Surgeon: Gaye Pollack, MD;  Location: [facility] OR;  Service: Open Heart Surgery;  Laterality: N/A;   LEFT HEART CATHETERIZATION WITH CORONARY ANGIOGRAM N/A 02/21/2014   Procedure: LEFT HEART CATHETERIZATION WITH CORONARY ANGIOGRAM;  Surgeon: Peter M Martinique, MD;  Location: [facility] CATH LAB;  Service: Cardiovascular;  Laterality: N/A;   Family History  Problem Relation Age of Onset   Cancer Mother 57       leukemia   Heart disease Father    Heart attack Father 68   Lung cancer Brother    Colon cancer Neg Hx    Rectal cancer Neg Hx    Social History   Socioeconomic History   Marital status: Married    Spouse name: Not on file   Number of children: 3   Years of education: Not on file   Highest education level: Not on file  Occupational History   Occupation: Manufacturing engineer buildings  Tobacco Use   Smoking status: Never   Smokeless tobacco: Former    Types: Chew    Quit date: 02/23/2014  Vaping Use   Vaping Use: Never used  Substance and Sexual Activity   Alcohol use: No   Drug use: No   Sexual activity: Never  Other Topics Concern   Not on file  Social History Narrative   Not on file   Social Determinants of Health   Financial Resource Strain: Not on file  Food Insecurity: Not on file  Transportation Needs: Not on  file  Physical Activity: Not on file  Stress: Not on file  Social Connections: Not on file    Tobacco Counseling Counseling given: Not Answered   Clinical Intake:  Pre-visit preparation completed: Yes  Pain : No/denies pain     BMI - recorded: 41 Nutritional Status: BMI > 30  Obese Diabetes: Yes  How often do you need to have someone help you when you read instructions, pamphlets, or other written materials from your doctor or pharmacy?: 3 - Sometimes  Diabetic?yes         Activities of Daily Living No flowsheet data found.  Patient Care Team: Lauree Chandler, NP as  PCP - General (Geriatric Medicine) [name], MD as PCP - Cardiology (Cardiology) [name], MD as Attending Physician (Urology) [name], MD as Consulting Physician (Nephrology) [name], [place] (Optometry)  Indicate any recent Medical Services you may have received from other than [facility] providers in the past year (date may be approximate).     Assessment:   This is a routine wellness examination for Olie.  Hearing/Vision screen Hearing Screening - Comments:: No new hearing issues Vision Screening - Comments:: Patient has no complaints  Dietary issues and exercise activities discussed:     Goals Addressed   None    Depression Screen PHQ 2/9 Scores 10/21/2020 06/17/2020 06/11/2019 05/23/2019 05/09/2019 02/08/2019 02/06/2018  PHQ - 2 Score 0 0 0 0 0 0 0    Fall Risk Fall Risk  06/21/2021 04/16/2021 06/17/2020 10/11/2019 08/30/2019  Falls in the past year? 0 0 0 0 0  Comment - - - - -  Number falls in past yr: 0 0 0 0 0  Injury with Fall? 0 - 0 0 0  Risk for fall due to : No Fall Risks - - - -  Risk for fall due to: Comment - - - - -  Follow up Falls evaluation completed - - - -    FALL RISK PREVENTION PERTAINING TO THE HOME:  Any stairs in or around the home? Yes  If so, are there any without handrails? No  Home free of loose throw rugs in walkways, pet  beds, electrical cords, etc? No  Adequate lighting in your home to reduce risk of falls? Yes   ASSISTIVE DEVICES UTILIZED TO PREVENT FALLS:  Life alert? No  Use of a cane, walker or w/c? No  Grab bars in the bathroom? Yes  Shower chair or bench in shower? Yes  Elevated toilet seat or a handicapped toilet? No   TIMED UP AND GO:  Was the test performed? No .    Cognitive Function: MMSE - Mini Mental State Exam 05/23/2019 12/16/2016  Orientation to time 5 5  Orientation to Place 5 5  Registration 3 3  Attention/ Calculation 5 5  Recall 3 1  Language- name 2 objects 2 2  Language- repeat 1 1  Language- follow 3 step command 3 3  Language- read & follow direction 1 1  Write a sentence 1 1  Copy design 1 1  Total score 30 28     6CIT Screen 06/21/2021 06/17/2020  What Year? 0 points 0 points  What month? 0 points 0 points  What time? 0 points 0 points  Count back from 20 0 points 0 points  Months in reverse 0 points 0 points  Repeat phrase 0 points 0 points  Total Score 0 0    Immunizations Immunization History  Administered Date(s) Administered   Fluad Quad(high Dose 65+) 08/30/2019   Influenza, High Dose Seasonal PF 11/08/2018   Influenza,inj,Quad PF,6+ Mos 08/27/2013, 09/30/2014, 10/02/2015, 09/16/2016, 10/27/2017   Influenza-Unspecified 09/25/2012, 10/05/2020   PFIZER(Purple Top)SARS-COV-2 Vaccination 01/30/2020, 09/21/2020, 10/05/2020   Pneumococcal Conjugate-13 06/10/2016   Pneumococcal Polysaccharide-23 12/09/2010   Td 12/26/2010    TDAP status: Due, Education has been provided regarding the importance of this vaccine. Advised may receive this vaccine at local pharmacy or Health Dept. Aware to provide a copy of the vaccination record if obtained from local pharmacy or Health Dept. Verbalized acceptance and understanding.  Flu Vaccine status: Up to date  Pneumococcal vaccine status: Up to date  Covid-19 vaccine  status: Completed vaccines  Qualifies for  Shingles Vaccine? Yes   Zostavax completed No   Shingrix Completed?: No.    Education has been provided regarding the importance of this vaccine. Patient has been advised to call insurance company to determine out of pocket expense if they have not yet received this vaccine. Advised may also receive vaccine at local pharmacy or Health Dept. Verbalized acceptance and understanding.  Screening Tests Health Maintenance  Topic Date Due   Zoster Vaccines- Shingrix (1 of 2) Never done   OPHTHALMOLOGY EXAM  05/07/2020   TETANUS/TDAP  12/26/2020   COVID-19 Vaccine (4 - Booster) 01/05/2021   INFLUENZA VACCINE  07/26/2021   HEMOGLOBIN A1C  10/07/2021   FOOT EXAM  10/21/2021   Hepatitis C Screening  Completed   PNA vac Low Risk Adult  Completed   HPV VACCINES  Aged Out    Health Maintenance  Health Maintenance Due  Topic Date Due   Zoster Vaccines- Shingrix (1 of 2) Never done   OPHTHALMOLOGY EXAM  05/07/2020   TETANUS/TDAP  12/26/2020   COVID-19 Vaccine (4 - Booster) 01/05/2021    Colorectal cancer screening: No longer required.   Lung Cancer Screening: (Low Dose CT Chest recommended if Age 21-80 years, 30 pack-year currently smoking OR have quit w/in 15years.) does not qualify.   Lung Cancer Screening Referral: na  Additional Screening:  Hepatitis C Screening: does qualify; Completed 2021  Vision Screening: Recommended annual ophthalmology exams for early detection of glaucoma and other disorders of the eye. Is the patient up to date with their annual eye exam?  No  Who is the provider or what is the name of the office in which the patient attends annual eye exams? [facility]  If pt is not established with a provider, would they like to be referred to a provider to establish care? No .   Dental Screening: Recommended annual dental exams for proper oral hygiene  Community Resource Referral / Chronic Care Management: CRR required this visit?  No   CCM required this visit?  No       Plan:     I have personally reviewed and noted the following in the patient's chart:   Medical and social history Use of alcohol, tobacco or illicit drugs  Current medications and supplements including opioid prescriptions. Patient is not currently taking opioid prescriptions. Functional ability and status Nutritional status Physical activity Advanced directives List of other physicians Hospitalizations, surgeries, and ER visits in previous 12 months Vitals Screenings to include cognitive, depression, and falls Referrals and appointments  In addition, I have reviewed and discussed with patient certain preventive protocols, quality metrics, and best practice recommendations. A written personalized care plan for preventive services as well as general preventive health recommendations were provided to patient.     Lauree Chandler, NP   06/21/2021    Virtual Visit via Telephone Note  I connected withNAME@ on 06/21/21 at  1:00 PM EDT by telephone and verified that I am speaking with the correct person using two identifiers.  Location: Patient: home Provider: [facility]   I discussed the limitations, risks, security and privacy concerns of performing an evaluation and management service by telephone and the availability of in person appointments. I also discussed with the patient that there may be a patient responsible charge related to this service. The patient expressed understanding and agreed to proceed.   I discussed the assessment and treatment plan with the patient. The patient was provided an opportunity to  ask questions and all were answered. The patient agreed with the plan and demonstrated an understanding of the instructions.   The patient was advised to call back or seek an in-person evaluation if the symptoms worsen or if the condition fails to improve as anticipated.  I provided 16 minutes of non-face-to-face time during this encounter.  Carlos American. Harle Battiest Avs printed and mailed

## 2021-06-21 NOTE — Telephone Encounter (Signed)
Mr. washington, whedbee are scheduled for a virtual visit with your provider today.    Just as we do with appointments in the office, we must obtain your consent to participate.  Your consent will be active for this visit and any virtual visit you may have with one of our providers in the next 365 days.    If you have a MyChart account, I can also send a copy of this consent to you electronically.  All virtual visits are billed to your insurance company just like a traditional visit in the office.  As this is a virtual visit, video technology does not allow for your provider to perform a traditional examination.  This may limit your provider's ability to fully assess your condition.  If your provider identifies any concerns that need to be evaluated in person or the need to arrange testing such as labs, EKG, etc, we will make arrangements to do so.    Although advances in technology are sophisticated, we cannot ensure that it will always work on either your end or our end.  If the connection with a video visit is poor, we may have to switch to a telephone visit.  With either a video or telephone visit, we are not always able to ensure that we have a secure connection.   I need to obtain your verbal consent now.   Are you willing to proceed with your visit today?   VENICE LIZ has provided verbal consent on 06/21/2021 for a virtual visit (video or telephone).   Caron Presume, CMA 06/21/2021  12:56 PM

## 2021-06-23 ENCOUNTER — Ambulatory Visit: Payer: Medicare Other | Admitting: *Deleted

## 2021-06-23 ENCOUNTER — Other Ambulatory Visit: Payer: Self-pay

## 2021-06-23 DIAGNOSIS — Z5181 Encounter for therapeutic drug level monitoring: Secondary | ICD-10-CM | POA: Diagnosis not present

## 2021-06-23 DIAGNOSIS — I4891 Unspecified atrial fibrillation: Secondary | ICD-10-CM | POA: Diagnosis not present

## 2021-06-23 LAB — POCT INR: INR: 3.8 — AB (ref 2.0–3.0)

## 2021-06-23 NOTE — Patient Instructions (Signed)
Description    Hold warfarin today and then start taking warfarin 1.5 tablets daily except for 1 tablet on Wednesday. Recheck INR in 3 weeks. [facility] [phone].

## 2021-07-16 DIAGNOSIS — Z87891 Personal history of nicotine dependence: Secondary | ICD-10-CM | POA: Diagnosis not present

## 2021-07-16 DIAGNOSIS — R791 Abnormal coagulation profile: Secondary | ICD-10-CM | POA: Diagnosis not present

## 2021-07-16 DIAGNOSIS — I493 Ventricular premature depolarization: Secondary | ICD-10-CM | POA: Diagnosis not present

## 2021-07-16 DIAGNOSIS — R0600 Dyspnea, unspecified: Secondary | ICD-10-CM | POA: Diagnosis not present

## 2021-07-16 DIAGNOSIS — I509 Heart failure, unspecified: Secondary | ICD-10-CM | POA: Diagnosis not present

## 2021-07-16 DIAGNOSIS — J9811 Atelectasis: Secondary | ICD-10-CM | POA: Diagnosis not present

## 2021-07-16 DIAGNOSIS — J9601 Acute respiratory failure with hypoxia: Secondary | ICD-10-CM | POA: Diagnosis not present

## 2021-07-16 DIAGNOSIS — E119 Type 2 diabetes mellitus without complications: Secondary | ICD-10-CM | POA: Diagnosis not present

## 2021-07-16 DIAGNOSIS — E872 Acidosis: Secondary | ICD-10-CM | POA: Diagnosis not present

## 2021-07-16 DIAGNOSIS — I130 Hypertensive heart and chronic kidney disease with heart failure and stage 1 through stage 4 chronic kidney disease, or unspecified chronic kidney disease: Secondary | ICD-10-CM | POA: Diagnosis not present

## 2021-07-16 DIAGNOSIS — E785 Hyperlipidemia, unspecified: Secondary | ICD-10-CM | POA: Diagnosis not present

## 2021-07-16 DIAGNOSIS — E1151 Type 2 diabetes mellitus with diabetic peripheral angiopathy without gangrene: Secondary | ICD-10-CM | POA: Diagnosis not present

## 2021-07-16 DIAGNOSIS — I4891 Unspecified atrial fibrillation: Secondary | ICD-10-CM | POA: Diagnosis not present

## 2021-07-16 DIAGNOSIS — J9621 Acute and chronic respiratory failure with hypoxia: Secondary | ICD-10-CM | POA: Diagnosis not present

## 2021-07-16 DIAGNOSIS — E1122 Type 2 diabetes mellitus with diabetic chronic kidney disease: Secondary | ICD-10-CM | POA: Diagnosis not present

## 2021-07-16 DIAGNOSIS — R0602 Shortness of breath: Secondary | ICD-10-CM | POA: Insufficient documentation

## 2021-07-16 DIAGNOSIS — I5033 Acute on chronic diastolic (congestive) heart failure: Secondary | ICD-10-CM | POA: Diagnosis not present

## 2021-07-16 DIAGNOSIS — I2510 Atherosclerotic heart disease of native coronary artery without angina pectoris: Secondary | ICD-10-CM | POA: Diagnosis not present

## 2021-07-16 DIAGNOSIS — Z951 Presence of aortocoronary bypass graft: Secondary | ICD-10-CM | POA: Diagnosis not present

## 2021-07-16 DIAGNOSIS — I255 Ischemic cardiomyopathy: Secondary | ICD-10-CM | POA: Diagnosis not present

## 2021-07-16 DIAGNOSIS — R609 Edema, unspecified: Secondary | ICD-10-CM | POA: Diagnosis not present

## 2021-07-16 DIAGNOSIS — R188 Other ascites: Secondary | ICD-10-CM | POA: Diagnosis not present

## 2021-07-16 DIAGNOSIS — E8770 Fluid overload, unspecified: Secondary | ICD-10-CM | POA: Diagnosis not present

## 2021-07-16 DIAGNOSIS — Z0289 Encounter for other administrative examinations: Secondary | ICD-10-CM | POA: Diagnosis not present

## 2021-07-16 DIAGNOSIS — I248 Other forms of acute ischemic heart disease: Secondary | ICD-10-CM | POA: Diagnosis not present

## 2021-07-16 DIAGNOSIS — E874 Mixed disorder of acid-base balance: Secondary | ICD-10-CM | POA: Diagnosis not present

## 2021-07-16 DIAGNOSIS — Z7901 Long term (current) use of anticoagulants: Secondary | ICD-10-CM | POA: Diagnosis not present

## 2021-07-16 DIAGNOSIS — Z87442 Personal history of urinary calculi: Secondary | ICD-10-CM | POA: Diagnosis not present

## 2021-07-16 DIAGNOSIS — N179 Acute kidney failure, unspecified: Secondary | ICD-10-CM | POA: Diagnosis not present

## 2021-07-16 DIAGNOSIS — Z794 Long term (current) use of insulin: Secondary | ICD-10-CM | POA: Diagnosis not present

## 2021-07-16 DIAGNOSIS — Z7984 Long term (current) use of oral hypoglycemic drugs: Secondary | ICD-10-CM | POA: Diagnosis not present

## 2021-07-16 DIAGNOSIS — I5043 Acute on chronic combined systolic (congestive) and diastolic (congestive) heart failure: Secondary | ICD-10-CM | POA: Diagnosis not present

## 2021-07-16 DIAGNOSIS — Z8546 Personal history of malignant neoplasm of prostate: Secondary | ICD-10-CM | POA: Diagnosis not present

## 2021-07-16 DIAGNOSIS — Z20822 Contact with and (suspected) exposure to covid-19: Secondary | ICD-10-CM | POA: Diagnosis not present

## 2021-07-16 DIAGNOSIS — I088 Other rheumatic multiple valve diseases: Secondary | ICD-10-CM | POA: Diagnosis not present

## 2021-07-16 DIAGNOSIS — D649 Anemia, unspecified: Secondary | ICD-10-CM | POA: Diagnosis not present

## 2021-07-16 DIAGNOSIS — J90 Pleural effusion, not elsewhere classified: Secondary | ICD-10-CM | POA: Diagnosis not present

## 2021-07-17 DIAGNOSIS — J9811 Atelectasis: Secondary | ICD-10-CM | POA: Diagnosis not present

## 2021-07-17 DIAGNOSIS — J90 Pleural effusion, not elsewhere classified: Secondary | ICD-10-CM | POA: Diagnosis not present

## 2021-07-17 DIAGNOSIS — J9621 Acute and chronic respiratory failure with hypoxia: Secondary | ICD-10-CM | POA: Diagnosis not present

## 2021-07-17 DIAGNOSIS — N179 Acute kidney failure, unspecified: Secondary | ICD-10-CM | POA: Diagnosis not present

## 2021-07-17 DIAGNOSIS — R609 Edema, unspecified: Secondary | ICD-10-CM | POA: Diagnosis not present

## 2021-07-17 DIAGNOSIS — I509 Heart failure, unspecified: Secondary | ICD-10-CM | POA: Diagnosis not present

## 2021-07-17 DIAGNOSIS — I5033 Acute on chronic diastolic (congestive) heart failure: Secondary | ICD-10-CM | POA: Diagnosis not present

## 2021-07-18 DIAGNOSIS — R609 Edema, unspecified: Secondary | ICD-10-CM | POA: Diagnosis not present

## 2021-07-18 DIAGNOSIS — I5033 Acute on chronic diastolic (congestive) heart failure: Secondary | ICD-10-CM | POA: Diagnosis not present

## 2021-07-18 DIAGNOSIS — J9621 Acute and chronic respiratory failure with hypoxia: Secondary | ICD-10-CM | POA: Diagnosis not present

## 2021-07-18 DIAGNOSIS — N179 Acute kidney failure, unspecified: Secondary | ICD-10-CM | POA: Diagnosis not present

## 2021-07-19 DIAGNOSIS — I248 Other forms of acute ischemic heart disease: Secondary | ICD-10-CM | POA: Insufficient documentation

## 2021-07-19 DIAGNOSIS — Z7984 Long term (current) use of oral hypoglycemic drugs: Secondary | ICD-10-CM | POA: Insufficient documentation

## 2021-07-19 DIAGNOSIS — E1151 Type 2 diabetes mellitus with diabetic peripheral angiopathy without gangrene: Secondary | ICD-10-CM

## 2021-07-19 DIAGNOSIS — I130 Hypertensive heart and chronic kidney disease with heart failure and stage 1 through stage 4 chronic kidney disease, or unspecified chronic kidney disease: Secondary | ICD-10-CM | POA: Insufficient documentation

## 2021-07-19 DIAGNOSIS — J9621 Acute and chronic respiratory failure with hypoxia: Secondary | ICD-10-CM | POA: Diagnosis not present

## 2021-07-19 DIAGNOSIS — R609 Edema, unspecified: Secondary | ICD-10-CM | POA: Diagnosis not present

## 2021-07-19 DIAGNOSIS — I5033 Acute on chronic diastolic (congestive) heart failure: Secondary | ICD-10-CM | POA: Diagnosis not present

## 2021-07-19 DIAGNOSIS — Z87891 Personal history of nicotine dependence: Secondary | ICD-10-CM

## 2021-07-19 DIAGNOSIS — I2489 Other forms of acute ischemic heart disease: Secondary | ICD-10-CM | POA: Insufficient documentation

## 2021-07-19 DIAGNOSIS — E1122 Type 2 diabetes mellitus with diabetic chronic kidney disease: Secondary | ICD-10-CM | POA: Insufficient documentation

## 2021-07-19 DIAGNOSIS — Z794 Long term (current) use of insulin: Secondary | ICD-10-CM | POA: Insufficient documentation

## 2021-07-19 DIAGNOSIS — N179 Acute kidney failure, unspecified: Secondary | ICD-10-CM | POA: Diagnosis not present

## 2021-07-19 DIAGNOSIS — Z951 Presence of aortocoronary bypass graft: Secondary | ICD-10-CM | POA: Insufficient documentation

## 2021-07-19 DIAGNOSIS — J9601 Acute respiratory failure with hypoxia: Secondary | ICD-10-CM | POA: Insufficient documentation

## 2021-07-19 DIAGNOSIS — I493 Ventricular premature depolarization: Secondary | ICD-10-CM | POA: Insufficient documentation

## 2021-07-19 DIAGNOSIS — E872 Acidosis, unspecified: Secondary | ICD-10-CM

## 2021-07-19 HISTORY — DX: Personal history of nicotine dependence: Z87.891

## 2021-07-19 HISTORY — DX: Type 2 diabetes mellitus with diabetic peripheral angiopathy without gangrene: E11.51

## 2021-07-19 HISTORY — DX: Acidosis, unspecified: E87.20

## 2021-07-20 DIAGNOSIS — I509 Heart failure, unspecified: Secondary | ICD-10-CM | POA: Diagnosis not present

## 2021-07-20 DIAGNOSIS — R609 Edema, unspecified: Secondary | ICD-10-CM | POA: Diagnosis not present

## 2021-07-20 DIAGNOSIS — J90 Pleural effusion, not elsewhere classified: Secondary | ICD-10-CM | POA: Diagnosis not present

## 2021-07-20 DIAGNOSIS — J9621 Acute and chronic respiratory failure with hypoxia: Secondary | ICD-10-CM | POA: Diagnosis not present

## 2021-07-20 DIAGNOSIS — N179 Acute kidney failure, unspecified: Secondary | ICD-10-CM | POA: Diagnosis not present

## 2021-07-20 DIAGNOSIS — J9811 Atelectasis: Secondary | ICD-10-CM | POA: Diagnosis not present

## 2021-07-20 DIAGNOSIS — I5033 Acute on chronic diastolic (congestive) heart failure: Secondary | ICD-10-CM | POA: Diagnosis not present

## 2021-07-21 DIAGNOSIS — J9621 Acute and chronic respiratory failure with hypoxia: Secondary | ICD-10-CM | POA: Diagnosis not present

## 2021-07-21 DIAGNOSIS — N179 Acute kidney failure, unspecified: Secondary | ICD-10-CM | POA: Diagnosis not present

## 2021-07-21 DIAGNOSIS — R609 Edema, unspecified: Secondary | ICD-10-CM | POA: Diagnosis not present

## 2021-07-21 DIAGNOSIS — I5033 Acute on chronic diastolic (congestive) heart failure: Secondary | ICD-10-CM | POA: Diagnosis not present

## 2021-07-22 DIAGNOSIS — N179 Acute kidney failure, unspecified: Secondary | ICD-10-CM | POA: Diagnosis not present

## 2021-07-22 DIAGNOSIS — E8770 Fluid overload, unspecified: Secondary | ICD-10-CM | POA: Diagnosis not present

## 2021-07-22 DIAGNOSIS — I255 Ischemic cardiomyopathy: Secondary | ICD-10-CM | POA: Diagnosis not present

## 2021-07-22 DIAGNOSIS — I5033 Acute on chronic diastolic (congestive) heart failure: Secondary | ICD-10-CM | POA: Diagnosis not present

## 2021-07-22 DIAGNOSIS — D649 Anemia, unspecified: Secondary | ICD-10-CM | POA: Diagnosis not present

## 2021-07-22 DIAGNOSIS — J9621 Acute and chronic respiratory failure with hypoxia: Secondary | ICD-10-CM | POA: Diagnosis not present

## 2021-07-22 DIAGNOSIS — R609 Edema, unspecified: Secondary | ICD-10-CM | POA: Diagnosis not present

## 2021-07-23 DIAGNOSIS — J9621 Acute and chronic respiratory failure with hypoxia: Secondary | ICD-10-CM | POA: Diagnosis not present

## 2021-07-23 DIAGNOSIS — I5033 Acute on chronic diastolic (congestive) heart failure: Secondary | ICD-10-CM | POA: Diagnosis not present

## 2021-07-23 DIAGNOSIS — N179 Acute kidney failure, unspecified: Secondary | ICD-10-CM | POA: Diagnosis not present

## 2021-07-23 DIAGNOSIS — R609 Edema, unspecified: Secondary | ICD-10-CM | POA: Diagnosis not present

## 2021-07-25 DIAGNOSIS — I2510 Atherosclerotic heart disease of native coronary artery without angina pectoris: Secondary | ICD-10-CM | POA: Diagnosis not present

## 2021-07-25 DIAGNOSIS — E1142 Type 2 diabetes mellitus with diabetic polyneuropathy: Secondary | ICD-10-CM | POA: Diagnosis not present

## 2021-07-25 DIAGNOSIS — Z9981 Dependence on supplemental oxygen: Secondary | ICD-10-CM | POA: Diagnosis not present

## 2021-07-25 DIAGNOSIS — I255 Ischemic cardiomyopathy: Secondary | ICD-10-CM | POA: Diagnosis not present

## 2021-07-25 DIAGNOSIS — Z7982 Long term (current) use of aspirin: Secondary | ICD-10-CM | POA: Diagnosis not present

## 2021-07-25 DIAGNOSIS — E1122 Type 2 diabetes mellitus with diabetic chronic kidney disease: Secondary | ICD-10-CM | POA: Diagnosis not present

## 2021-07-25 DIAGNOSIS — Z7984 Long term (current) use of oral hypoglycemic drugs: Secondary | ICD-10-CM | POA: Diagnosis not present

## 2021-07-25 DIAGNOSIS — Z7901 Long term (current) use of anticoagulants: Secondary | ICD-10-CM | POA: Diagnosis not present

## 2021-07-25 DIAGNOSIS — C61 Malignant neoplasm of prostate: Secondary | ICD-10-CM | POA: Diagnosis not present

## 2021-07-25 DIAGNOSIS — Z87891 Personal history of nicotine dependence: Secondary | ICD-10-CM | POA: Diagnosis not present

## 2021-07-25 DIAGNOSIS — I130 Hypertensive heart and chronic kidney disease with heart failure and stage 1 through stage 4 chronic kidney disease, or unspecified chronic kidney disease: Secondary | ICD-10-CM | POA: Diagnosis not present

## 2021-07-25 DIAGNOSIS — Z951 Presence of aortocoronary bypass graft: Secondary | ICD-10-CM | POA: Diagnosis not present

## 2021-07-25 DIAGNOSIS — E669 Obesity, unspecified: Secondary | ICD-10-CM | POA: Diagnosis not present

## 2021-07-25 DIAGNOSIS — N189 Chronic kidney disease, unspecified: Secondary | ICD-10-CM | POA: Diagnosis not present

## 2021-07-25 DIAGNOSIS — I4891 Unspecified atrial fibrillation: Secondary | ICD-10-CM | POA: Diagnosis not present

## 2021-07-25 DIAGNOSIS — Z794 Long term (current) use of insulin: Secondary | ICD-10-CM | POA: Diagnosis not present

## 2021-07-25 DIAGNOSIS — J9621 Acute and chronic respiratory failure with hypoxia: Secondary | ICD-10-CM | POA: Diagnosis not present

## 2021-07-25 DIAGNOSIS — E785 Hyperlipidemia, unspecified: Secondary | ICD-10-CM | POA: Diagnosis not present

## 2021-07-25 DIAGNOSIS — Z6833 Body mass index (BMI) 33.0-33.9, adult: Secondary | ICD-10-CM | POA: Diagnosis not present

## 2021-07-25 DIAGNOSIS — I5023 Acute on chronic systolic (congestive) heart failure: Secondary | ICD-10-CM | POA: Diagnosis not present

## 2021-07-26 DIAGNOSIS — N189 Chronic kidney disease, unspecified: Secondary | ICD-10-CM | POA: Diagnosis not present

## 2021-07-26 DIAGNOSIS — I5023 Acute on chronic systolic (congestive) heart failure: Secondary | ICD-10-CM | POA: Diagnosis not present

## 2021-07-26 DIAGNOSIS — Z1389 Encounter for screening for other disorder: Secondary | ICD-10-CM | POA: Diagnosis not present

## 2021-07-26 DIAGNOSIS — I130 Hypertensive heart and chronic kidney disease with heart failure and stage 1 through stage 4 chronic kidney disease, or unspecified chronic kidney disease: Secondary | ICD-10-CM | POA: Diagnosis not present

## 2021-07-26 DIAGNOSIS — I4891 Unspecified atrial fibrillation: Secondary | ICD-10-CM | POA: Diagnosis not present

## 2021-07-26 DIAGNOSIS — Z7901 Long term (current) use of anticoagulants: Secondary | ICD-10-CM | POA: Diagnosis not present

## 2021-07-26 DIAGNOSIS — E8809 Other disorders of plasma-protein metabolism, not elsewhere classified: Secondary | ICD-10-CM | POA: Diagnosis not present

## 2021-07-26 DIAGNOSIS — E1122 Type 2 diabetes mellitus with diabetic chronic kidney disease: Secondary | ICD-10-CM | POA: Diagnosis not present

## 2021-07-26 DIAGNOSIS — J9621 Acute and chronic respiratory failure with hypoxia: Secondary | ICD-10-CM | POA: Diagnosis not present

## 2021-07-27 DIAGNOSIS — R29818 Other symptoms and signs involving the nervous system: Secondary | ICD-10-CM | POA: Insufficient documentation

## 2021-07-27 DIAGNOSIS — I4891 Unspecified atrial fibrillation: Secondary | ICD-10-CM | POA: Diagnosis not present

## 2021-07-27 DIAGNOSIS — E1122 Type 2 diabetes mellitus with diabetic chronic kidney disease: Secondary | ICD-10-CM | POA: Diagnosis not present

## 2021-07-27 DIAGNOSIS — Z7689 Persons encountering health services in other specified circumstances: Secondary | ICD-10-CM | POA: Diagnosis not present

## 2021-07-27 DIAGNOSIS — I5023 Acute on chronic systolic (congestive) heart failure: Secondary | ICD-10-CM | POA: Diagnosis not present

## 2021-07-27 DIAGNOSIS — J9621 Acute and chronic respiratory failure with hypoxia: Secondary | ICD-10-CM | POA: Diagnosis not present

## 2021-07-27 DIAGNOSIS — I130 Hypertensive heart and chronic kidney disease with heart failure and stage 1 through stage 4 chronic kidney disease, or unspecified chronic kidney disease: Secondary | ICD-10-CM | POA: Diagnosis not present

## 2021-07-27 DIAGNOSIS — N189 Chronic kidney disease, unspecified: Secondary | ICD-10-CM | POA: Diagnosis not present

## 2021-07-27 DIAGNOSIS — I5020 Unspecified systolic (congestive) heart failure: Secondary | ICD-10-CM | POA: Diagnosis not present

## 2021-07-29 ENCOUNTER — Telehealth: Payer: Self-pay

## 2021-07-29 DIAGNOSIS — E1122 Type 2 diabetes mellitus with diabetic chronic kidney disease: Secondary | ICD-10-CM | POA: Diagnosis not present

## 2021-07-29 DIAGNOSIS — I5023 Acute on chronic systolic (congestive) heart failure: Secondary | ICD-10-CM | POA: Diagnosis not present

## 2021-07-29 DIAGNOSIS — I4891 Unspecified atrial fibrillation: Secondary | ICD-10-CM | POA: Diagnosis not present

## 2021-07-29 DIAGNOSIS — N189 Chronic kidney disease, unspecified: Secondary | ICD-10-CM | POA: Diagnosis not present

## 2021-07-29 DIAGNOSIS — I509 Heart failure, unspecified: Secondary | ICD-10-CM | POA: Diagnosis not present

## 2021-07-29 DIAGNOSIS — J9621 Acute and chronic respiratory failure with hypoxia: Secondary | ICD-10-CM | POA: Diagnosis not present

## 2021-07-29 DIAGNOSIS — I130 Hypertensive heart and chronic kidney disease with heart failure and stage 1 through stage 4 chronic kidney disease, or unspecified chronic kidney disease: Secondary | ICD-10-CM | POA: Diagnosis not present

## 2021-08-01 NOTE — Progress Notes (Signed)
Cardiology Office Note:    Date:  08/01/2021   ID:  Seth [name], DOB [birth year], MRN [id number]  PCP:  Lauree Chandler, NP   [facility] Providers Cardiologist:  Lauree Chandler, MD     Referring MD: Lauree Chandler, NP   Follow-up for coronary artery disease and paroxysmal atrial fibrillation  History of Present Illness:    Seth [name] is a 80 y.o. male with a hx of atrial fibrillation, diabetes, hypertension, hyperlipidemia, coronary artery disease status post CABG, prostate cancer, renal insufficiency, and obesity.  He was initially seen 2/15 for evaluation of his atrial fibrillation and was placed on Xarelto.  He underwent stress testing which was abnormal and showed apical inferior lateral scar and ischemia.  His LVEF was around 32% echocardiogram 2/15 showed an LVEF of 35-40% with akinesis in the mid distal anterioseptal and apical portions of the myocardium.  He was also noted to have dilated left atria.  A cardiac catheterization 02/21/2014 showed occluded left main, moderate RCA stenosis with his entire left system filling from right to left collaterals.  He underwent CABG x3 02/24/14 (LIMA-LAD, SVG-OM, SVG-PDA.  Echocardiogram 05/28/2014 showed an LVEF of 35-40%, concentric LVH, and mild MR.  A follow-up echocardiogram 2020 showed an LVEF 50-55%, LVH, and no significant valvular abnormalities.  He followed up with [name] 11/16/2020 and denied chest pain, palpitations, dyspnea, lower extremity swelling, orthopnea, PND, dizziness, near-syncope, and syncope.  He presents to the clinic today for follow-up evaluation states he was recently admitted to [facility] for CHF exacerbation.  He presents with his wife and daughter.  He was discharged on 07/24/2019 and his weight was around 236 pounds.  His wife, retired Therapist, sports, indicated that he was having significant amount of trouble breathing, required BiPAP, and has significant lower extremity edema.  He  received IV diuresis.  On discharge his furosemide was changed to torsemide.  His weight today is 232.6 pounds on his home scale from 237.7 pounds in the office.  He had been discharged home on 2 L of oxygen.  He was no longer requiring oxygen.  His oxygen saturation was in the mid 90s in the office.  He does have a sleep study scheduled at his [facility] this coming Sunday.  I reviewed the importance of low-sodium diet and medication compliance.  I will request labs from his recent hospitalization and [facility].  We will decrease his torsemide to 40 mg daily given his recent weight loss.  I will have him follow-up with me as scheduled on 08/17/2021, in 3 months, and with [name] in 6 months.  I will order an INR for today.  He reports that he is in the process of visiting dermatology to have a lesion removed on his back.  I have requested that they ask dermatology to send [place] a preoperative cardiac evaluation form so that our pharmacy may manage his warfarin.   Today he denies chest pain, shortness of breath, lower extremity edema, fatigue, palpitations, melena, hematuria, hemoptysis, diaphoresis, weakness, presyncope, syncope, orthopnea, and PND.   Past Medical History:  Diagnosis Date   Atrial fibrillation ([place])    a. coumadin;  b. Amiodarone   CAD (coronary artery disease)    a.  Lexiscan Myoview (02/14/14):  Apical, inferolateral scar; ? Mild ischemia; EF 32%.;  b.  LHC (02/21/14):  dLM 100%, LAD 100%, CFX 100%, dRCA 50%. => CABG (L-LAD, S-OM, S-PDA)   Chronic systolic heart failure ([place])  CKD (chronic kidney disease)    proteinuria   Hemorrhage of gastrointestinal tract, unspecified    Hemorrhage of rectum and anus    HLD (hyperlipidemia)    HTN (hypertension)    Impotence of organic origin    Ischemic cardiomyopathy    a. Echo (02/17/14):  EF 35-40%, anteroseptal and apical AK, Gr 2 DD, MAC, mod LAE.   Nephrolithiasis    Peripheral vascular disease, unspecified ([place])     Prostate cancer ([place])    Thrombocytopenia, unspecified ([place])    Tobacco use disorder    Tobacco user 11/11/2009   Type II or unspecified type diabetes mellitus with renal manifestations, uncontrolled(250.42)     Past Surgical History:  Procedure Laterality Date   BACK SURGERY     CATARACT EXTRACTION  2010   CLIPPING OF ATRIAL APPENDAGE Left 02/24/2014   Procedure: CLIPPING OF ATRIAL APPENDAGE;  Surgeon: Gaye Pollack, MD;  Location: [facility];  Service: Open Heart Surgery;  Laterality: Left;   COLONOSCOPY  2005   Normal   CORONARY ARTERY BYPASS GRAFT N/A 02/24/2014   Procedure: Coronary artery bypass graft times three using left internal mammary artery and left leg greater saphenous vein harvested endoscopically.;  Surgeon: Gaye Pollack, MD;  Location: [facility];  Service: Open Heart Surgery;  Laterality: N/A;   INTRAOPERATIVE TRANSESOPHAGEAL ECHOCARDIOGRAM N/A 02/24/2014   Procedure: INTRAOPERATIVE TRANSESOPHAGEAL ECHOCARDIOGRAM;  Surgeon: Gaye Pollack, MD;  Location: [facility] OR;  Service: Open Heart Surgery;  Laterality: N/A;   LEFT HEART CATHETERIZATION WITH CORONARY ANGIOGRAM N/A 02/21/2014   Procedure: LEFT HEART CATHETERIZATION WITH CORONARY ANGIOGRAM;  Surgeon: Peter M Martinique, MD;  Location: [facility] C[facility] LAB;  Service: Cardiovascular;  Laterality: N/A;    Current Medications: No outpatient medications have been marked as taking for the 08/03/21 encounter (Appointment) with Deberah Pelton, NP.     Allergies:   Patient has no known allergies.   Social History   Socioeconomic History   Marital status: Married    Spouse name: Not on file   Number of children: 3   Years of education: Not on file   Highest education level: Not on file  Occupational History   Occupation: Manufacturing engineer buildings  Tobacco Use   Smoking status: Never   Smokeless tobacco: Former    Types: Chew    Quit date: 02/23/2014  Vaping Use   Vaping Use: Never used  Substance and Sexual Activity   Alcohol use: No   Drug  use: No   Sexual activity: Never  Other Topics Concern   Not on file  Social History Narrative   Not on file   Social Determinants of Health   Financial Resource Strain: Not on file  Food Insecurity: Not on file  Transportation Needs: Not on file  Physical Activity: Not on file  Stress: Not on file  Social Connections: Not on file     Family History: The patient's family history includes Cancer (age of onset: 49) in his mother; Heart attack (age of onset: 41) in his father; Heart disease in his father; Lung cancer in his brother. There is no history of Colon cancer or Rectal cancer.  ROS:   Please see the history of present illness.     All other systems reviewed and are negative.   Risk Assessment/Calculations:           Physical Exam:    VS:  There were no vitals taken for this visit.    Wt Readings from Last  3 Encounters:  06/21/21 256 lb (116.1 kg)  04/16/21 253 lb 3.2 oz (114.9 kg)  04/07/21 253 lb 2 oz (114.8 kg)     GEN:  Well nourished, well developed in no acute distress HEENT: Normal NECK: No JVD; No carotid bruits LYMPHATICS: No lymphadenopathy CARDIAC: RRR, no murmurs, rubs, gallops RESPIRATORY:  Clear to auscultation without rales, wheezing or rhonchi  ABDOMEN: Soft, non-tender, non-distended MUSCULOSKELETAL:  No edema; No deformity  SKIN: Warm and dry NEUROLOGIC:  Alert and oriented x 3 PSYCHIATRIC:  Normal affect    EKGs/Labs/Other Studies Reviewed:    The following studies were reviewed today: Echocardiogram 10/02/2019 IMPRESSIONS     1. Left ventricular ejection fraction, by visual estimation, is 50 to  55%. The left ventricle has normal function. There is moderately increased  left ventricular hypertrophy.   2. Left ventricular diastolic Doppler parameters are consistent with  impaired relaxation pattern of LV diastolic filling.   3. Global right ventricle has normal systolic function.The right  ventricular size is normal. No increase  in right ventricular wall  thickness.   4. Left atrial size was normal.   5. Right atrial size was mildly dilated.   6. The mitral valve is normal in structure. No evidence of mitral valve  regurgitation.   7. The tricuspid valve is normal in structure. Tricuspid valve  regurgitation was not visualized by color flow Doppler.   8. The aortic valve is tricuspid Aortic valve regurgitation was not  visualized by color flow Doppler. Mild to moderate aortic valve  sclerosis/calcification without any evidence of aortic stenosis.   9. The pulmonic valve was grossly normal. Pulmonic valve regurgitation is  mild by color flow Doppler.  10. The atrial septum is grossly normal.  EKG:  EKG is  ordered today.  The ekg ordered today demonstrates normal sinus rhythm left axis deviation septal infarct undetermined age 63 and T wave abnormality 65 bpm  Recent Labs: 04/13/2021: ALT 13; BUN 20; Creat 1.05; Hemoglobin 12.3; Platelets 163; Potassium 4.8; Sodium 144  Recent Lipid Panel    Component Value Date/Time   CHOL 93 04/13/2021 0947   CHOL 101 06/08/2016 0843   TRIG 86 04/13/2021 0947   HDL 38 (L) 04/13/2021 0947   HDL 37 (L) 06/08/2016 0843   CHOLHDL 2.4 04/13/2021 0947   VLDL 23 07/21/2017 0835   LDLCALC 38 04/13/2021 0947    ASSESSMENT & PLAN    Ischemic cardiomyopathy-no increased DOE or activity intolerance.  Echocardiogram 10/20 showed LVEF 50-55% and no significant valvular abnormalities.  Recent hospitalization at [facility] for CHF exacerbation discharged on 07/23/2021.  Weight on 236 pounds on 07/24/2021 Continue carvedilol, amlodipine, lisinopril Heart healthy low-sodium diet-salty 6 given Increase physical activity as tolerated  Coronary artery disease-no recent episodes of arm neck back or chest discomfort.  Status post CABG x3 02/24/14. Continue aspirin, atorvastatin, amlodipine, carvedilol Heart healthy low-sodium diet-salty 6 given Increase physical activity as  tolerated  Hyperlipidemia-04/13/2021: Cholesterol 93; HDL 38; LDL Cholesterol (Calc) 38; Triglycerides 86 Continue atorvastatin, aspirin, flaxseed Heart healthy low-sodium high-fiber diet Increase physical activity as tolerated  Atrial fibrillation-no recent episodes of increased or irregular heart rate.  Reports compliance with warfarin.  Denies bleeding issues. Continue carvedilol, warfarin Heart healthy low-sodium diet-salty 6 given Increase physical activity as tolerated  Essential hypertension-BP today 134/68.  Well-controlled at home. Continue amlodipine, carvedilol, lisinopril Heart healthy low-sodium diet-salty 6 given Increase physical activity as tolerated  Disposition: Follow-up with me as scheduled, again in 3  months and Dr.  Angelena Form in 6 months.       Medication Adjustments/Labs and Tests Ordered: Current medicines are reviewed at length with the patient today.  Concerns regarding medicines are outlined above.  No orders of the defined types were placed in this encounter.  No orders of the defined types were placed in this encounter.   There are no Patient Instructions on file for this visit.   Signed, Deberah Pelton, NP  08/01/2021 7:02 PM      Notice: This dictation was prepared with Dragon dictation along with smaller phrase technology. Any transcriptional errors that result from this process are unintentional and may not be corrected upon review.  I spent 20 minutes examining this patient, reviewing medications, and using patient centered shared decision making involving her cardiac care.  Prior to her visit I spent greater than 20 minutes reviewing her past medical history,  medications, and prior cardiac tests.

## 2021-08-02 DIAGNOSIS — N183 Chronic kidney disease, stage 3 unspecified: Secondary | ICD-10-CM | POA: Diagnosis not present

## 2021-08-02 DIAGNOSIS — N270 Small kidney, unilateral: Secondary | ICD-10-CM | POA: Diagnosis not present

## 2021-08-03 ENCOUNTER — Ambulatory Visit (INDEPENDENT_AMBULATORY_CARE_PROVIDER_SITE_OTHER): Payer: Medicare Other

## 2021-08-03 ENCOUNTER — Encounter (HOSPITAL_BASED_OUTPATIENT_CLINIC_OR_DEPARTMENT_OTHER): Payer: Self-pay | Admitting: General Practice

## 2021-08-03 ENCOUNTER — Ambulatory Visit (INDEPENDENT_AMBULATORY_CARE_PROVIDER_SITE_OTHER): Payer: Medicare Other | Admitting: General Practice

## 2021-08-03 ENCOUNTER — Other Ambulatory Visit: Payer: Self-pay

## 2021-08-03 ENCOUNTER — Telehealth: Payer: Self-pay

## 2021-08-03 VITALS — BP 134/68 | HR 65 | Ht 66.0 in | Wt 237.7 lb

## 2021-08-03 DIAGNOSIS — I4891 Unspecified atrial fibrillation: Secondary | ICD-10-CM | POA: Diagnosis not present

## 2021-08-03 DIAGNOSIS — Z79899 Other long term (current) drug therapy: Secondary | ICD-10-CM

## 2021-08-03 DIAGNOSIS — I10 Essential (primary) hypertension: Secondary | ICD-10-CM

## 2021-08-03 DIAGNOSIS — I255 Ischemic cardiomyopathy: Secondary | ICD-10-CM | POA: Diagnosis not present

## 2021-08-03 DIAGNOSIS — E7800 Pure hypercholesterolemia, unspecified: Secondary | ICD-10-CM | POA: Diagnosis not present

## 2021-08-03 DIAGNOSIS — I2510 Atherosclerotic heart disease of native coronary artery without angina pectoris: Secondary | ICD-10-CM

## 2021-08-03 DIAGNOSIS — Z5181 Encounter for therapeutic drug level monitoring: Secondary | ICD-10-CM | POA: Diagnosis not present

## 2021-08-03 LAB — POCT INR: INR: 2 (ref 2.0–3.0)

## 2021-08-03 MED ORDER — TORSEMIDE 20 MG PO TABS: 40.0000 mg | ORAL_TABLET | Freq: Every day | ORAL | 6 refills | Status: DC

## 2021-08-03 NOTE — Telephone Encounter (Incomplete Revision)
We did receive the lab worj from [facility] and they do not have the labs that we need so, pt needs to come in for additional lab per Coletta Memos, FNP-C. Per Coletta Memos, FNP-C the records from [facility] ar no longer needed as he coming here to have them done.

## 2021-08-03 NOTE — Telephone Encounter (Addendum)
We did receive the lab work from [facility] and they do not have the labs that we need so, pt needs to come in for additional lab per Coletta Memos, FNP-C. Per Coletta Memos, FNP-C the records from [facility] ar no longer needed as he coming here to have them done.

## 2021-08-03 NOTE — Patient Instructions (Addendum)
Medication Instructions:   DECREASE Torsemide to 40 mg daily.  *If you need a refill on your cardiac medications before your next appointment, please call your pharmacy*   Follow-Up: At [facility], you and your health needs are our priority.  As part of our continuing mission to provide you with exceptional heart care, we have created designated Provider Care Teams.  These Care Teams include your primary Cardiologist (physician) and Advanced Practice Providers (APPs -  Physician Assistants and Nurse Practitioners) who all work together to provide you with the care you need, when you need it.  We recommend signing up for the patient portal called "MyChart".  Sign up information is provided on this After Visit Summary.  MyChart is used to connect with patients for Virtual Visits (Telemedicine).  Patients are able to view lab/test results, encounter notes, upcoming appointments, etc.  Non-urgent messages can be sent to your provider as well.   To learn more about what you can do with MyChart, go to [email or web address].    Please keep your currently scheduled follow-up appointments and schedule the appointments below:  Your next appointment:   3 month(s)  The format for your next appointment:   In Person  Provider:   Coletta Memos, FNP   Follow-up with [name] in 6 months.  You need to have your INR checked at the [facility] office.  Other Instructions  We will request labs from [facility] and [facility].   Dermatology needs to fax a clearance form to [place] for any procedures. [facility] office fax number is [phone] ATTN: Seth Memos, FNP/Michelle, LPN   Please keep a log of your weight and weigh yourself daily:  Your physician recommends that you weigh, daily, at the same time every day, and in the same amount of clothing. Please record your daily weights on the handout provided and bring it to your next appointment.  Seth [name] has recommended that  you increase your physical activity as tolerated and please review the low sodium diet information below:

## 2021-08-03 NOTE — Patient Instructions (Signed)
Description   Continue taking warfarin 1.5 tablets daily except for 1 tablet on Wednesday. Recheck INR in 3 weeks. [facility] [phone].

## 2021-08-03 NOTE — Telephone Encounter (Signed)
Pt's wife stated they were out of town and didn't know when they would be back home. Stated she would call to make appt when they returned.

## 2021-08-05 ENCOUNTER — Telehealth (HOSPITAL_BASED_OUTPATIENT_CLINIC_OR_DEPARTMENT_OTHER): Payer: Self-pay

## 2021-08-05 DIAGNOSIS — I130 Hypertensive heart and chronic kidney disease with heart failure and stage 1 through stage 4 chronic kidney disease, or unspecified chronic kidney disease: Secondary | ICD-10-CM | POA: Diagnosis not present

## 2021-08-05 DIAGNOSIS — E1122 Type 2 diabetes mellitus with diabetic chronic kidney disease: Secondary | ICD-10-CM | POA: Diagnosis not present

## 2021-08-05 DIAGNOSIS — N189 Chronic kidney disease, unspecified: Secondary | ICD-10-CM | POA: Diagnosis not present

## 2021-08-05 DIAGNOSIS — I4891 Unspecified atrial fibrillation: Secondary | ICD-10-CM | POA: Diagnosis not present

## 2021-08-05 DIAGNOSIS — I5023 Acute on chronic systolic (congestive) heart failure: Secondary | ICD-10-CM | POA: Diagnosis not present

## 2021-08-05 DIAGNOSIS — J9621 Acute and chronic respiratory failure with hypoxia: Secondary | ICD-10-CM | POA: Diagnosis not present

## 2021-08-05 NOTE — Addendum Note (Signed)
Addended by: Waylan Rocher on: 08/05/2021 02:58 PM   Modules accepted: Orders

## 2021-08-05 NOTE — Telephone Encounter (Signed)
Received a referral from [facility] to become a patient at our office. Called patient tried to get an appt scheduled. Pt stated his wife will cb to schedule this New Patient appt with our office. Will hold onto referral paperwork [place]. Please advise.

## 2021-08-08 DIAGNOSIS — R29818 Other symptoms and signs involving the nervous system: Secondary | ICD-10-CM | POA: Diagnosis not present

## 2021-08-08 DIAGNOSIS — G4733 Obstructive sleep apnea (adult) (pediatric): Secondary | ICD-10-CM | POA: Diagnosis not present

## 2021-08-09 NOTE — Telephone Encounter (Signed)
Pt did not come in Friday for his fasting lab, Provider Marilynn Rail, [place] notified

## 2021-08-11 DIAGNOSIS — J9621 Acute and chronic respiratory failure with hypoxia: Secondary | ICD-10-CM | POA: Diagnosis not present

## 2021-08-11 DIAGNOSIS — I5023 Acute on chronic systolic (congestive) heart failure: Secondary | ICD-10-CM | POA: Diagnosis not present

## 2021-08-11 DIAGNOSIS — I130 Hypertensive heart and chronic kidney disease with heart failure and stage 1 through stage 4 chronic kidney disease, or unspecified chronic kidney disease: Secondary | ICD-10-CM | POA: Diagnosis not present

## 2021-08-11 DIAGNOSIS — I4891 Unspecified atrial fibrillation: Secondary | ICD-10-CM | POA: Diagnosis not present

## 2021-08-11 DIAGNOSIS — E1122 Type 2 diabetes mellitus with diabetic chronic kidney disease: Secondary | ICD-10-CM | POA: Diagnosis not present

## 2021-08-11 DIAGNOSIS — N189 Chronic kidney disease, unspecified: Secondary | ICD-10-CM | POA: Diagnosis not present

## 2021-08-13 ENCOUNTER — Ambulatory Visit (INDEPENDENT_AMBULATORY_CARE_PROVIDER_SITE_OTHER): Payer: Medicare Other | Admitting: Internal Medicine

## 2021-08-13 ENCOUNTER — Other Ambulatory Visit: Payer: Self-pay

## 2021-08-13 ENCOUNTER — Ambulatory Visit (INDEPENDENT_AMBULATORY_CARE_PROVIDER_SITE_OTHER): Payer: Medicare Other | Admitting: *Deleted

## 2021-08-13 ENCOUNTER — Encounter: Payer: Self-pay | Admitting: Internal Medicine

## 2021-08-13 VITALS — BP 142/64 | HR 70 | Ht 66.0 in | Wt 234.0 lb

## 2021-08-13 DIAGNOSIS — Z794 Long term (current) use of insulin: Secondary | ICD-10-CM | POA: Diagnosis not present

## 2021-08-13 DIAGNOSIS — I4891 Unspecified atrial fibrillation: Secondary | ICD-10-CM

## 2021-08-13 DIAGNOSIS — Z5181 Encounter for therapeutic drug level monitoring: Secondary | ICD-10-CM

## 2021-08-13 DIAGNOSIS — E1165 Type 2 diabetes mellitus with hyperglycemia: Secondary | ICD-10-CM | POA: Diagnosis not present

## 2021-08-13 DIAGNOSIS — E1129 Type 2 diabetes mellitus with other diabetic kidney complication: Secondary | ICD-10-CM

## 2021-08-13 DIAGNOSIS — E1152 Type 2 diabetes mellitus with diabetic peripheral angiopathy with gangrene: Secondary | ICD-10-CM | POA: Diagnosis not present

## 2021-08-13 DIAGNOSIS — R809 Proteinuria, unspecified: Secondary | ICD-10-CM

## 2021-08-13 DIAGNOSIS — N1831 Chronic kidney disease, stage 3a: Secondary | ICD-10-CM | POA: Diagnosis not present

## 2021-08-13 DIAGNOSIS — E1122 Type 2 diabetes mellitus with diabetic chronic kidney disease: Secondary | ICD-10-CM

## 2021-08-13 DIAGNOSIS — E1143 Type 2 diabetes mellitus with diabetic autonomic (poly)neuropathy: Secondary | ICD-10-CM

## 2021-08-13 LAB — BASIC METABOLIC PANEL
BUN: 26 mg/dL — ABNORMAL HIGH (ref 6–23)
CO2: 31 mEq/L (ref 19–32)
Calcium: 10.1 mg/dL (ref 8.4–10.5)
Chloride: 102 mEq/L (ref 96–112)
Creatinine, Ser: 1.43 mg/dL (ref 0.40–1.50)
GFR: 46.73 mL/min — ABNORMAL LOW (ref >60.00)
Glucose, Bld: 51 mg/dL — ABNORMAL LOW (ref 70–99)
Potassium: 4 mEq/L (ref 3.5–5.1)
Sodium: 142 mEq/L (ref 135–145)

## 2021-08-13 LAB — POCT GLYCOSYLATED HEMOGLOBIN (HGB A1C): Hemoglobin A1C: 6.9 % — AB (ref 4.0–5.6)

## 2021-08-13 LAB — TSH: TSH: 2.5 u[IU]/mL (ref 0.35–5.50)

## 2021-08-13 LAB — MICROALBUMIN / CREATININE URINE RATIO
Creatinine,U: 38 mg/dL
Microalb Creat Ratio: 82.3 mg/g — ABNORMAL HIGH (ref 0.0–30.0)
Microalb, Ur: 31.3 mg/dL — ABNORMAL HIGH (ref 0.0–1.9)

## 2021-08-13 LAB — POCT INR: INR: 1.2 — AB (ref 2.0–3.0)

## 2021-08-13 MED ORDER — DEXCOM G6 RECEIVER DEVI: 1.0000 | 0 refills | Status: DC

## 2021-08-13 MED ORDER — DEXCOM G6 TRANSMITTER MISC: 1.0000 | 3 refills | Status: DC

## 2021-08-13 MED ORDER — DEXCOM G6 SENSOR MISC: 1.0000 | 3 refills | Status: DC

## 2021-08-13 NOTE — Patient Instructions (Addendum)
-   Keep up the Good Work ! - Continue  Humalog Mix to 40 units with  Breakfast and  36 units with  Supper  - Continue Metformin 1 tablet with Breakfast and Supper      HOW TO TREAT LOW BLOOD SUGARS (Blood sugar LESS THAN 70 MG/DL) Please follow the RULE OF 15 for the treatment of hypoglycemia treatment (when your (blood sugars are less than 70 mg/dL)   STEP 1: Take 15 grams of carbohydrates when your blood sugar is low, which includes:  3-4 GLUCOSE TABS  OR 3-4 OZ OF JUICE OR REGULAR SODA OR ONE TUBE OF GLUCOSE GEL    STEP 2: RECHECK blood sugar in 15 MINUTES STEP 3: If your blood sugar is still low at the 15 minute recheck --> then, go back to STEP 1 and treat AGAIN with another 15 grams of carbohydrates.

## 2021-08-13 NOTE — Progress Notes (Signed)
Name: Seth [name]  Age/ Sex: 80 y.o., male   MRN/ DOB: [id number], [birth year]     PCP: Lauree Chandler, NP   Reason for Endocrinology Evaluation: Type 2 Diabetes Mellitus  Initial Endocrine Consultative Visit: 10/09/2019    PATIENT IDENTIFIER: Seth [name] is a 80 y.o. male with a past medical history of T2DM, HTN, PVD, CAD, A.Fib and Prostate Ca ( S/P chemo and radiation). The patient has followed with Endocrinology clinic since 10/09/2019 for consultative assistance with management of his diabetes.      DIABETIC HISTORY:  Seth [name] was diagnosed with T2DM many years ago.  He was prescribed Jardiance in the past, but unable to use due to cost.  hemoglobin A1c has ranged from 6.4% in 2015, peaking at 11.7% in 2020 On his initial visit to our clinic his A1c was 9.7%, he was on glipizide, Toujeo, and metformin.  We switched his Toujeo to Humalog mix, we stopped glipizide and continued Metformin.  Lives with wife  SUBJECTIVE:   During the last visit (04/07/2021): A1c 8.1 %.  We increased Humalog mix and continued Metformin  Today (08/13/2021): Seth [name] is here for a follow-up on diabetes management. Accompanied by wife   He checks his blood sugars 2  times daily, preprandial to breakfast and bedtime. The patient has not had hypoglycemic episodes since the last clinic visit.     Has pulmonary edema  7/22 nd , currently on diuretic  sees cardiology  He is going through PT  Denies SOB , has been decreasing  He also sees urology     HOME DIABETES REGIMEN:  Humalog Mix 40 units with Breakfast and 36 units with Supper  Metformin 1000 mg BID     Statin: Yes ACE-I/ARB: Yes    GLUCOSE LOG:  Fasting 127-161 Night 121-197 mg/dl     DIABETIC COMPLICATIONS: Microvascular complications:  Neuropathy  Denies: CKD, retinopathy  Last eye exam: Completed 12/2019   Macrovascular complications:  CAD, PVD Denies: CVA   HISTORY:  Past Medical History:  Past  Medical History:  Diagnosis Date   Atrial fibrillation ([place])    a. coumadin;  b. Amiodarone   CAD (coronary artery disease)    a.  Lexiscan Myoview (02/14/14):  Apical, inferolateral scar; ? Mild ischemia; EF 32%.;  b.  LHC (02/21/14):  dLM 100%, LAD 100%, CFX 100%, dRCA 50%. => CABG (L-LAD, S-OM, S-PDA)   Chronic systolic heart failure (HCC)    CKD (chronic kidney disease)    proteinuria   Hemorrhage of gastrointestinal tract, unspecified    Hemorrhage of rectum and anus    HLD (hyperlipidemia)    HTN (hypertension)    Impotence of organic origin    Ischemic cardiomyopathy    a. Echo (02/17/14):  EF 35-40%, anteroseptal and apical AK, Gr 2 DD, MAC, mod LAE.   Nephrolithiasis    Peripheral vascular disease, unspecified ([place])    Prostate cancer ([place])    Thrombocytopenia, unspecified ([place])    Tobacco use disorder    Tobacco user 11/11/2009   Type II or unspecified type diabetes mellitus with renal manifestations, uncontrolled(250.42)    Past Surgical History:  Past Surgical History:  Procedure Laterality Date   BACK SURGERY     CATARACT EXTRACTION  2010   CLIPPING OF ATRIAL APPENDAGE Left 02/24/2014   Procedure: CLIPPING OF ATRIAL APPENDAGE;  Surgeon: Gaye Pollack, MD;  Location: [place];  Service: Open Heart Surgery;  Laterality: Left;   COLONOSCOPY  2005   Normal   CORONARY ARTERY BYPASS GRAFT N/A 02/24/2014   Procedure: Coronary artery bypass graft times three using left internal mammary artery and left leg greater saphenous vein harvested endoscopically.;  Surgeon: Gaye Pollack, MD;  [facility];  Service: Open Heart Surgery;  Laterality: N/A;   INTRAOPERATIVE TRANSESOPHAGEAL ECHOCARDIOGRAM N/A 02/24/2014   Procedure: INTRAOPERATIVE TRANSESOPHAGEAL ECHOCARDIOGRAM;  Surgeon: Gaye Pollack, MD;  Location: [place] [facility];  Service: Open Heart Surgery;  Laterality: N/A;   LEFT HEART CATHETERIZATION WITH CORONARY ANGIOGRAM N/A 02/21/2014   Procedure: LEFT HEART CATHETERIZATION WITH CORONARY  ANGIOGRAM;  Surgeon: Peter M Martinique, MD;  Location: [facility] CATH LAB;  Service: Cardiovascular;  Laterality: N/A;   Social History:  reports that he has never smoked. He quit smokeless tobacco use about 7 years ago.  His smokeless tobacco use included chew. He reports that he does not drink alcohol and does not use drugs. Family History:  Family History  Problem Relation Age of Onset   Cancer Mother 12       leukemia   Heart disease Father    Heart attack Father 72   Lung cancer Brother    Colon cancer Neg Hx    Rectal cancer Neg Hx      HOME MEDICATIONS: Allergies as of 08/13/2021   No Known Allergies      Medication List        Accurate as of August 13, 2021  1:21 PM. If you have any questions, ask your nurse or doctor.          STOP taking these medications    acetaZOLAMIDE 250 MG tablet Commonly known as: DIAMOX Stopped by: Dorita Sciara, MD       TAKE these medications    aspirin EC 81 MG tablet Take 1 tablet (81 mg total) by mouth daily.   atorvastatin 40 MG tablet Commonly known as: LIPITOR TAKE ONE TABLET BY MOUTH ONE TIME DAILY   Calcium Carbonate-Vitamin D 600-200 MG-UNIT Tabs Take 1 tablet by mouth daily. For bones   carvedilol 6.25 MG tablet Commonly known as: COREG Take 12.5 mg by mouth 2 (two) times daily with a meal.   FLAXSEED OIL PO Take 1 tablet by mouth daily.   Insulin Lispro Prot & Lispro (75-25) 100 UNIT/ML Kwikpen Commonly known as: HumaLOG Mix 75/25 KwikPen Inject 40 Units into the skin daily with breakfast AND 36 Units daily with supper.   lisinopril 40 MG tablet Commonly known as: ZESTRIL TAKE ONE TABLET BY MOUTH TWICE DAILY   metFORMIN 1000 MG tablet Commonly known as: GLUCOPHAGE TAKE ONE TABLET BY MOUTH TWICE DAILY WITH MEAL   MULTIVITAMINS PO Take 1 tablet by mouth daily.   potassium chloride SA 20 MEQ tablet Commonly known as: KLOR-CON TAKE ONE TABLET BY MOUTH ONE TIME DAILY   torsemide 20 MG  tablet Commonly known as: DEMADEX Take 2 tablets (40 mg total) by mouth daily.   warfarin 5 MG tablet Commonly known as: COUMADIN Take as directed by the anticoagulation clinic. If you are unsure how to take this medication, talk to your nurse or doctor. Original instructions: Take 1 and 1/2 tablets daily by mouth or as directed by Anticoagulation Clinic.         OBJECTIVE:   Vital Signs: BP (!) 142/64   Pulse 70   Ht _0  (1.676 m)   Wt 234 lb (106.1 kg)   SpO2 98%   BMI 37.77 kg/m   Wt Readings  from Last 3 Encounters:  08/13/21 234 lb (106.1 kg)  08/03/21 237 lb 11.2 oz (107.8 kg)  06/21/21 256 lb (116.1 kg)     Exam: General: Pt appears well and is in NAD  Lungs: Clear with good BS bilat with no rales, rhonchi, or wheezes  Heart: RRR with normal S1 and S2 and no gallops; no murmurs; no rub  Extremities:  No pretibial edema..  Neuro: MS is good with appropriate affect, pt is alert and Ox3       DM foot exam: 04/07/2021   The skin of the feet is  without sores or ulcerations. The pedal pulses are 1+ on right and 1+ on left. The sensation is absent to a screening 5.07, 10 gram monofilament bilaterally   DATA REVIEWED:  Lab Results  Component Value Date   HGBA1C 6.9 (A) 08/13/2021   HGBA1C 8.1 (A) 04/07/2021   HGBA1C 9.3 (A) 12/07/2020   Results for Seth [name], Seth [name] (MRN [id number]) as of 08/15/2021 13:00  Ref. Range 08/13/2021 13:59 08/13/2021 14:47  Sodium Latest Ref Range: 135 - 145 mEq/L 142   Potassium Latest Ref Range: 3.5 - 5.1 mEq/L 4.0   Chloride Latest Ref Range: 96 - 112 mEq/L 102   CO2 Latest Ref Range: 19 - 32 mEq/L 31   Glucose Latest Ref Range: 70 - 99 mg/dL 51 (L)   BUN Latest Ref Range: 6 - 23 mg/dL 26 (H)   Creatinine Latest Ref Range: 0.40 - 1.50 mg/dL 1.43   Calcium Latest Ref Range: 8.4 - 10.5 mg/dL 10.1   GFR Latest Ref Range: >60.00 mL/min 46.73 (L)   MICROALB/CREAT RATIO Latest Ref Range: 0.0 - 30.0 mg/g  82.3 (H)   07/19/2021  A1c  8.9% Na 142 K 3.6 BUN/Cr 51/1.45 GFR 47   ASSESSMENT / PLAN / RECOMMENDATIONS:   1) Type 2 Diabetes Mellitus,Optimally  controlled, With neuropathic, microalbuminurea and macrovascular complications - Most recent A1c of 6.9 %. Goal A1c <7.0%.  - Praised the pt on improved glycemic control  - Spouse with him today and his family has been helping him making healthy choices, hence the improved glycemic control - In review of his glucose log there was no evidence of hypoglycemia but on his BMP today , his BG was 51 mg/mL ,will reduce morning dose  - Did not qualify for Jardiance pt assistance , will try again 2023  as he is a great candidate given hx of CHF - Pt has no medicare plan D - I am going to prescribed dexcom and see if this will be cvered under plan B    MEDICATIONS: Decrease  Humalog mix  36 units with breakfast and 36 units with  supper Continue Metformin 1000 mg twice daily  EDUCATION / INSTRUCTIONS: BG monitoring instructions: Patient is instructed to check his blood sugars to times a day, before breakfast and supper. Call [facility] if: BG persistently < 70  I reviewed the Rule of 15 for the treatment of hypoglycemia in detail with the patient. Literature supplied.   2)Diabetic complications:  Eye: Does not have known diabetic retinopathy.  Neuro/ Feet: Does have known diabetic peripheral neuropathy. Renal: Patient does have known baseline CKD. He is on an ACEI/ARB at present.    F/U in 4 months  Addendum: a portal message has been sent to reduce AM insulin dose     Signed electronically by: Mack Guise, MD  [facility]  [address]  [place], [place] [address] Phone: [phone] FAX: [phone]   CC: Lauree Chandler, NP [place] [address] Phone: [phone]  Fax: [phone]  Return to Endocrinology clinic as below: Future Appointments  Date  Time Provider [place]  08/13/2021  2:15 PM [facility]  08/17/2021 11:45 AM Deberah Pelton, NP [facility]  10/15/2021  1:00 PM Lauree Chandler, NP [facility] None  06/27/2022  1:00 PM Lauree Chandler, NP [facility] None

## 2021-08-13 NOTE — Patient Instructions (Signed)
Description   Take 2 tablets of warfarin today and tomorrow then START taking warfarin 1.5 tablets daily. Recheck INR in 1 week. Coumadin Clinic [phone].

## 2021-08-15 DIAGNOSIS — E1152 Type 2 diabetes mellitus with diabetic peripheral angiopathy with gangrene: Secondary | ICD-10-CM | POA: Insufficient documentation

## 2021-08-15 DIAGNOSIS — N1831 Chronic kidney disease, stage 3a: Secondary | ICD-10-CM | POA: Insufficient documentation

## 2021-08-15 MED ORDER — INSULIN LISPRO PROT & LISPRO (75-25 MIX) 100 UNIT/ML KWIKPEN
36.0000 [IU] | PEN_INJECTOR | Freq: Two times a day (BID) | SUBCUTANEOUS | 3 refills | Status: DC
Start: 2021-08-15 — End: 2022-03-16

## 2021-08-16 NOTE — Progress Notes (Signed)
Cardiology Office Note:    Date:  08/16/2021   ID:  Seth [name], DOB [birth year], MRN [id number]  PCP:  Lauree Chandler, NP   [facility] Providers Cardiologist:  Lauree Chandler, MD      Referring MD: Lauree Chandler, NP   Follow-up for CHF exacerbation  History of Present Illness:    Seth [name] is a 80 y.o. male with a hx of atrial fibrillation, diabetes, hypertension, hyperlipidemia, coronary artery disease status post CABG, prostate cancer, renal insufficiency, and obesity.  He was initially seen 2/15 for evaluation of his atrial fibrillation and was placed on Xarelto.  He underwent stress testing which was abnormal and showed apical inferior lateral scar and ischemia.  His LVEF was around 32% echocardiogram 2/15 showed an LVEF of 35-40% with akinesis in the mid distal anterioseptal and apical portions of the myocardium.  He was also noted to have dilated left atria.  A cardiac catheterization 02/21/2014 showed occluded left main, moderate RCA stenosis with his entire left system filling from right to left collaterals.  He underwent CABG x3 02/24/14 (LIMA-LAD, SVG-OM, SVG-PDA.  Echocardiogram 05/28/2014 showed an LVEF of 35-40%, concentric LVH, and mild MR.  A follow-up echocardiogram 2020 showed an LVEF 50-55%, LVH, and no significant valvular abnormalities.   He followed up with [name] 11/16/2020 and denied chest pain, palpitations, dyspnea, lower extremity swelling, orthopnea, PND, dizziness, near-syncope, and syncope.   He presented to the clinic 08/03/2021 for follow-up evaluation stated he was recently admitted to [facility] for CHF exacerbation.  He presented with his wife and daughter.  He was discharged on 07/24/2019 and his weight was around 236 pounds.  His wife, retired Therapist, sports, indicated that he was having significant amount of trouble breathing, required BiPAP, and had significant lower extremity edema.  He received IV diuresis.  On discharge his  furosemide was changed to torsemide.  His weight today was 232.6 pounds on his home scale from 237.7 pounds in the office.  He had been discharged home on 2 L of oxygen.  He was no longer requiring oxygen.  His oxygen saturation was in the mid 90s in the office.  He did have a sleep study scheduled at his [facility] this coming Sunday.  I reviewed the importance of low-sodium diet and medication compliance.  I requested labs from his recent hospitalization and [facility].  We decreased his torsemide to 40 mg daily given his recent weight loss.  I planned follow-up with me as scheduled on 08/17/2021, in 3 months, and with [name] in 6 months.  I ordered an INR .  He reported that he was in the process of visiting dermatology to have a lesion removed on his back.  I  requested that they ask dermatology to send [place] a preoperative cardiac evaluation form so that our pharmacy may manage his warfarin.   He presents to the clinic today for follow-up evaluation states he feels well.  He presents with his wife retired Therapist, sports and daughter.  We did a medication reconciliation.  His blood pressure today is 100/60.  He denies presyncope and syncope.  I have asked him to maintain a blood pressure log.  We will continue his lisinopril at the current dose at this time.  He has been taking 40 mill equivalents of potassium with his torsemide.  I will continue 40 mill equivalent dosing and refill medication.  We will continue daily weights, heart healthy low-sodium diet, and maintain  his physical activity.  They are planning to take a trip to [place] in the next few days.  I will have him get up and ambulate every hour.  I would also like him to wear lower extremity support stockings.  We will have him follow-up with me in 3 months and [name] in 6 months.   Today he denies chest pain, shortness of breath, lower extremity edema, fatigue, palpitations, melena, hematuria, hemoptysis, diaphoresis, weakness, presyncope,  syncope, orthopnea, and PND.  Past Medical History:  Diagnosis Date   Atrial fibrillation ([place])    a. coumadin;  b. Amiodarone   CAD (coronary artery disease)    a.  Lexiscan Myoview (02/14/14):  Apical, inferolateral scar; ? Mild ischemia; EF 32%.;  b.  LHC (02/21/14):  dLM 100%, LAD 100%, CFX 100%, dRCA 50%. => CABG (L-LAD, S-OM, S-PDA)   Chronic systolic heart failure (HCC)    CKD (chronic kidney disease)    proteinuria   Hemorrhage of gastrointestinal tract, unspecified    Hemorrhage of rectum and anus    HLD (hyperlipidemia)    HTN (hypertension)    Impotence of organic origin    Ischemic cardiomyopathy    a. Echo (02/17/14):  EF 35-40%, anteroseptal and apical AK, Gr 2 DD, MAC, mod LAE.   Nephrolithiasis    Peripheral vascular disease, unspecified ([place])    Prostate cancer ([place])    Thrombocytopenia, unspecified ([place])    Tobacco use disorder    Tobacco user 11/11/2009   Type II or unspecified type diabetes mellitus with renal manifestations, uncontrolled(250.42)     Past Surgical History:  Procedure Laterality Date   BACK SURGERY     CATARACT EXTRACTION  2010   CLIPPING OF ATRIAL APPENDAGE Left 02/24/2014   Procedure: CLIPPING OF ATRIAL APPENDAGE;  Surgeon: Gaye Pollack, MD;  Location: [place];  Service: Open Heart Surgery;  Laterality: Left;   COLONOSCOPY  2005   Normal   CORONARY ARTERY BYPASS GRAFT N/A 02/24/2014   Procedure: Coronary artery bypass graft times three using left internal mammary artery and left leg greater saphenous vein harvested endoscopically.;  Surgeon: Gaye Pollack, MD;  [facility];  Service: Open Heart Surgery;  Laterality: N/A;   INTRAOPERATIVE TRANSESOPHAGEAL ECHOCARDIOGRAM N/A 02/24/2014   Procedure: INTRAOPERATIVE TRANSESOPHAGEAL ECHOCARDIOGRAM;  Surgeon: Gaye Pollack, MD;  Location: [place] OR;  Service: Open Heart Surgery;  Laterality: N/A;   LEFT HEART CATHETERIZATION WITH CORONARY ANGIOGRAM N/A 02/21/2014   Procedure: LEFT HEART CATHETERIZATION  WITH CORONARY ANGIOGRAM;  Surgeon: Peter M Martinique, MD;  Location: [facility] CATH LAB;  Service: Cardiovascular;  Laterality: N/A;    Current Medications: No outpatient medications have been marked as taking for the 08/17/21 encounter (Appointment) with Deberah Pelton, NP.     Allergies:   Patient has no known allergies.   Social History   Socioeconomic History   Marital status: Married    Spouse name: Not on file   Number of children: 3   Years of education: Not on file   Highest education level: Not on file  Occupational History   Occupation: Manufacturing engineer buildings  Tobacco Use   Smoking status: Never   Smokeless tobacco: Former    Types: Chew    Quit date: 02/23/2014  Vaping Use   Vaping Use: Never used  Substance and Sexual Activity   Alcohol use: No   Drug use: No   Sexual activity: Never  Other Topics Concern   Not on file  Social  History Narrative   Not on file   Social Determinants of Health   Financial Resource Strain: Not on file  Food Insecurity: Not on file  Transportation Needs: Not on file  Physical Activity: Not on file  Stress: Not on file  Social Connections: Not on file     Family History: The patient's family history includes Cancer (age of onset: 31) in his mother; Heart attack (age of onset: 35) in his father; Heart disease in his father; Lung cancer in his brother. There is no history of Colon cancer or Rectal cancer.  ROS:   Please see the history of present illness.     All other systems reviewed and are negative.   Risk Assessment/Calculations:           Physical Exam:    VS:  There were no vitals taken for this visit.    Wt Readings from Last 3 Encounters:  08/13/21 234 lb (106.1 kg)  08/03/21 237 lb 11.2 oz (107.8 kg)  06/21/21 256 lb (116.1 kg)     GEN:  Well nourished, well developed in no acute distress HEENT: Normal NECK: No JVD; No carotid bruits LYMPHATICS: No lymphadenopathy CARDIAC: RRR, no murmurs, rubs,  gallops RESPIRATORY:  Clear to auscultation without rales, wheezing or rhonchi  ABDOMEN: Soft, non-tender, non-distended MUSCULOSKELETAL:  No edema; No deformity  SKIN: Warm and dry NEUROLOGIC:  Alert and oriented x 3 PSYCHIATRIC:  Normal affect    EKGs/Labs/Other Studies Reviewed:    The following studies were reviewed today: Echocardiogram 10/02/2019 IMPRESSIONS     1. Left ventricular ejection fraction, by visual estimation, is 50 to  55%. The left ventricle has normal function. There is moderately increased  left ventricular hypertrophy.   2. Left ventricular diastolic Doppler parameters are consistent with  impaired relaxation pattern of LV diastolic filling.   3. Global right ventricle has normal systolic function.The right  ventricular size is normal. No increase in right ventricular wall  thickness.   4. Left atrial size was normal.   5. Right atrial size was mildly dilated.   6. The mitral valve is normal in structure. No evidence of mitral valve  regurgitation.   7. The tricuspid valve is normal in structure. Tricuspid valve  regurgitation was not visualized by color flow Doppler.   8. The aortic valve is tricuspid Aortic valve regurgitation was not  visualized by color flow Doppler. Mild to moderate aortic valve  sclerosis/calcification without any evidence of aortic stenosis.   9. The pulmonic valve was grossly normal. Pulmonic valve regurgitation is  mild by color flow Doppler.  10. The atrial septum is grossly normal.    EKG:  EKG is not ordered today.  Recent Labs: 04/13/2021: ALT 13; Hemoglobin 12.3; Platelets 163 08/13/2021: BUN 26; Creatinine, Ser 1.43; Potassium 4.0; Sodium 142; TSH 2.50  Recent Lipid Panel    Component Value Date/Time   CHOL 93 04/13/2021 0947   CHOL 101 06/08/2016 0843   TRIG 86 04/13/2021 0947   HDL 38 (L) 04/13/2021 0947   HDL 37 (L) 06/08/2016 0843   CHOLHDL 2.4 04/13/2021 0947   VLDL 23 07/21/2017 0835   LDLCALC 38 04/13/2021  0947    ASSESSMENT & PLAN    Ischemic cardiomyopathy-euvolemic today.  Weight today 233 pounds.  No increased DOE or activity intolerance.  Echocardiogram 10/20 showed LVEF 50-55% and no significant valvular abnormalities.  Reviewed hospitalization at [facility] for CHF exacerbation discharged on 07/23/2021.  Weight  236 pounds on  07/24/2021 at discharge. Continue carvedilol, amlodipine, lisinopril Heart healthy low-sodium diet-salty 6 given Increase physical activity as tolerated Repeat CBC and BMP  Coronary artery disease-denies chest discomfort.   Status post CABG x3 02/24/14. Continue aspirin, atorvastatin, amlodipine, carvedilol Heart healthy low-sodium diet-salty 6 given Increase physical activity as tolerated   Hyperlipidemia-04/13/2021: Cholesterol 93; HDL 38; LDL Cholesterol (Calc) 38; Triglycerides 86 Continue atorvastatin, aspirin, flaxseed Heart healthy low-sodium high-fiber diet Increase physical activity as tolerated   Atrial fibrillation-heart rate today 66 bpm.  Reports compliance with warfarin.  Denies bleeding issues. Continue carvedilol, warfarin Heart healthy low-sodium diet-salty 6 given Increase physical activity as tolerated   Essential hypertension-BP today 100/60.  Well-controlled at home. Continue amlodipine, carvedilol, lisinopril Heart healthy low-sodium diet-salty 6 given Increase physical activity as tolerated   Disposition: Follow-up with me  in 3 months and Dr.  Angelena Form in 6 months.          Medication Adjustments/Labs and Tests Ordered: Current medicines are reviewed at length with the patient today.  Concerns regarding medicines are outlined above.  No orders of the defined types were placed in this encounter.  No orders of the defined types were placed in this encounter.   There are no Patient Instructions on file for this visit.   Signed, Deberah Pelton, NP  08/16/2021 7:55 AM      Notice: This dictation was prepared with  Dragon dictation along with smaller phrase technology. Any transcriptional errors that result from this process are unintentional and may not be corrected upon review.  I spent 15 minutes examining this patient, reviewing medications, and using patient centered shared decision making involving her cardiac care.  Prior to her visit I spent greater than 20 minutes reviewing her past medical history,  medications, and prior cardiac tests.

## 2021-08-17 ENCOUNTER — Encounter (HOSPITAL_BASED_OUTPATIENT_CLINIC_OR_DEPARTMENT_OTHER): Payer: Self-pay | Admitting: General Practice

## 2021-08-17 ENCOUNTER — Ambulatory Visit (INDEPENDENT_AMBULATORY_CARE_PROVIDER_SITE_OTHER): Payer: Medicare Other | Admitting: General Practice

## 2021-08-17 ENCOUNTER — Other Ambulatory Visit: Payer: Self-pay

## 2021-08-17 VITALS — BP 100/60 | HR 66 | Ht 70.0 in | Wt 233.0 lb

## 2021-08-17 DIAGNOSIS — N183 Chronic kidney disease, stage 3 unspecified: Secondary | ICD-10-CM | POA: Diagnosis not present

## 2021-08-17 DIAGNOSIS — I10 Essential (primary) hypertension: Secondary | ICD-10-CM | POA: Diagnosis not present

## 2021-08-17 DIAGNOSIS — I2510 Atherosclerotic heart disease of native coronary artery without angina pectoris: Secondary | ICD-10-CM | POA: Diagnosis not present

## 2021-08-17 DIAGNOSIS — I255 Ischemic cardiomyopathy: Secondary | ICD-10-CM

## 2021-08-17 DIAGNOSIS — I4891 Unspecified atrial fibrillation: Secondary | ICD-10-CM

## 2021-08-17 DIAGNOSIS — I5022 Chronic systolic (congestive) heart failure: Secondary | ICD-10-CM | POA: Diagnosis not present

## 2021-08-17 DIAGNOSIS — E7800 Pure hypercholesterolemia, unspecified: Secondary | ICD-10-CM

## 2021-08-17 MED ORDER — POTASSIUM CHLORIDE CRYS ER 20 MEQ PO TBCR
40.0000 meq | EXTENDED_RELEASE_TABLET | Freq: Every day | ORAL | 1 refills | Status: DC
Start: 2021-08-17 — End: 2021-11-22

## 2021-08-17 MED ORDER — LISINOPRIL 40 MG PO TABS: 40.0000 mg | ORAL_TABLET | Freq: Two times a day (BID) | ORAL | 1 refills | Status: DC

## 2021-08-17 MED ORDER — TORSEMIDE 20 MG PO TABS: 40.0000 mg | ORAL_TABLET | Freq: Every day | ORAL | 1 refills | Status: DC

## 2021-08-17 MED ORDER — METFORMIN HCL 1000 MG PO TABS: ORAL_TABLET | ORAL | 0 refills | Status: DC

## 2021-08-17 NOTE — Patient Instructions (Addendum)
Medication Instructions:  Your physician recommends that you continue on your current medications as directed. Please refer to the Current Medication list given to you today.  *If you need a refill on your cardiac medications before your next appointment, please call your pharmacy*  Follow-Up: At [facility], you and your health needs are our priority.  As part of our continuing mission to provide you with exceptional heart care, we have created designated Provider Care Teams.  These Care Teams include your primary Cardiologist (physician) and Advanced Practice Providers (APPs -  Physician Assistants and Nurse Practitioners) who all work together to provide you with the care you need, when you need it.  We recommend signing up for the patient portal called "MyChart".  Sign up information is provided on this After Visit Summary.  MyChart is used to connect with patients for Virtual Visits (Telemedicine).  Patients are able to view lab/test results, encounter notes, upcoming appointments, etc.  Non-urgent messages can be sent to your provider as well.   To learn more about what you can do with MyChart, go to [email or web address].    Your next appointment:    3 months in person with Coletta Memos, FNP  -and-  6 months in person with [name]    Other Instructions  You have been referred to Dr. Hollie Salk with [facility]. They will call you to schedule an appointment.  Coletta Memos, FNP has recommended wearing Lower Extremity Compression stockings for your trip to [place], or making sure you are stopping to get out of the car to walk frequently. He has also recommended continuing the [place] Low sodium diet and keeping a Blood Pressure Log.  Tips to Measure your Blood Pressure Correctly  To determine whether you have hypertension, a medical professional will take a blood pressure reading. How you prepare for the test, the position of your arm, and other factors can change a  blood pressure reading by 10% or more. That could be enough to hide high blood pressure, start you on a drug you don't really need, or lead your doctor to incorrectly adjust your medications.  National and international guidelines offer specific instructions for measuring blood pressure. If a doctor, nurse, or medical assistant isn't doing it right, don't hesitate to ask him or her to get with the guidelines.  Here's what you can do to ensure a correct reading:  Don't drink a caffeinated beverage or smoke during the 30 minutes before the test.  Sit quietly for five minutes before the test begins.  During the measurement, sit in a chair with your feet on the floor and your arm supported so your elbow is at about heart level.  The inflatable part of the cuff should completely cover at least 80% of your upper arm, and the cuff should be placed on bare skin, not over a shirt.  Don't talk during the measurement.  Have your blood pressure measured twice, with a brief break in between. If the readings are different by 5 points or more, have it done a third time.  In 2017, new guidelines from the [employer], the [employer] of Cardiology, and nine other health organizations lowered the diagnosis of high blood pressure to 130/80 mm Hg or higher for all adults. The guidelines also redefined the various blood pressure categories to now include normal, elevated, Stage 1 hypertension, Stage 2 hypertension, and hypertensive crisis (see "Blood pressure categories").  Blood pressure categories  Blood pressure category SYSTOLIC (upper number)  DIASTOLIC (lower number)  Normal Less than 120 mm Hg and Less than 80 mm Hg  Elevated 120-129 mm Hg and Less than 80 mm Hg  High blood pressure: Stage 1 hypertension 130-139 mm Hg or 80-89 mm Hg  High blood pressure: Stage 2 hypertension 140 mm Hg or higher or 90 mm Hg or higher  Hypertensive crisis (consult your doctor immediately) Higher than 180 mm  Hg and/or Higher than 120 mm Hg  Source: American Heart Association and American Stroke Association. For more on getting your blood pressure under control, buy Controlling Your Blood Pressure, a Special Health Report from [facility].   Blood Pressure Log   Date   Time  Blood Pressure  Position  Example: Nov 1 9 AM 124/78 sitting

## 2021-08-18 DIAGNOSIS — L821 Other seborrheic keratosis: Secondary | ICD-10-CM | POA: Diagnosis not present

## 2021-08-18 DIAGNOSIS — C44519 Basal cell carcinoma of skin of other part of trunk: Secondary | ICD-10-CM | POA: Diagnosis not present

## 2021-08-20 ENCOUNTER — Ambulatory Visit (INDEPENDENT_AMBULATORY_CARE_PROVIDER_SITE_OTHER): Payer: Medicare Other

## 2021-08-20 ENCOUNTER — Other Ambulatory Visit: Payer: Self-pay

## 2021-08-20 DIAGNOSIS — Z5181 Encounter for therapeutic drug level monitoring: Secondary | ICD-10-CM | POA: Diagnosis not present

## 2021-08-20 DIAGNOSIS — I4891 Unspecified atrial fibrillation: Secondary | ICD-10-CM | POA: Diagnosis not present

## 2021-08-20 LAB — POCT INR: INR: 1.6 — AB (ref 2.0–3.0)

## 2021-08-20 NOTE — Patient Instructions (Signed)
Description   Take 2 tablets of warfarin today, then resume same dosage of warfarin 1.5 tablets daily. Recheck INR in 2 weeks (pt is going out of town, will not return until 09/02/21). [facility] [phone].

## 2021-08-23 ENCOUNTER — Telehealth: Payer: Self-pay | Admitting: General Practice

## 2021-08-23 ENCOUNTER — Other Ambulatory Visit: Payer: Self-pay

## 2021-08-23 MED ORDER — CARVEDILOL 12.5 MG PO TABS: 12.5000 mg | ORAL_TABLET | Freq: Two times a day (BID) | ORAL | 0 refills | Status: DC

## 2021-08-23 MED ORDER — CARVEDILOL 6.25 MG PO TABS
12.5000 mg | ORAL_TABLET | Freq: Two times a day (BID) | ORAL | 0 refills | Status: DC
Start: 2021-08-23 — End: 2021-08-23

## 2021-08-23 NOTE — Telephone Encounter (Signed)
  *  STAT* If patient is at the pharmacy, call can be transferred to refill team.   1. Which medications need to be refilled? (please list name of each medication and dose if known)   carvedilol (COREG) 6.25 MG tablet    2. Which pharmacy/location (including street and city if local pharmacy) is medication to be sent to? [facility], [address] [place] [address], [phone]  3. Do they need a 30 day or 90 day supply? 30 days  Pt left his meds at home and needs emergency refill send today

## 2021-08-23 NOTE — Addendum Note (Signed)
Addended by: Gaetano Net on: 08/23/2021 01:06 PM   Modules accepted: Orders

## 2021-08-23 NOTE — Telephone Encounter (Addendum)
**Note De-Identified Decoda Van Obfuscation** I have e-scribed the pts Carvedilol 12.5 mg #60 (30 day supply) with no refills to [facility] on [address] in [place], [place] to fill as requested by [facility] [employer] in [place], [place].  FYI: I did change the pts Carvedilol 6.25 mg with instructions to take 2 tablets BID to 12.5 mg with instructions to take 1 tablet BID so his ins will cover.

## 2021-09-03 ENCOUNTER — Ambulatory Visit (INDEPENDENT_AMBULATORY_CARE_PROVIDER_SITE_OTHER): Payer: Medicare Other

## 2021-09-03 ENCOUNTER — Other Ambulatory Visit: Payer: Self-pay

## 2021-09-03 DIAGNOSIS — Z5181 Encounter for therapeutic drug level monitoring: Secondary | ICD-10-CM | POA: Diagnosis not present

## 2021-09-03 DIAGNOSIS — I4891 Unspecified atrial fibrillation: Secondary | ICD-10-CM | POA: Diagnosis not present

## 2021-09-03 LAB — POCT INR: INR: 2.9 (ref 2.0–3.0)

## 2021-09-03 NOTE — Patient Instructions (Signed)
Description   Continue warfarin 1.5 tablets daily. Recheck INR in 4 weeks. [facility] [phone].

## 2021-09-09 DIAGNOSIS — C44519 Basal cell carcinoma of skin of other part of trunk: Secondary | ICD-10-CM | POA: Diagnosis not present

## 2021-09-16 DIAGNOSIS — G4733 Obstructive sleep apnea (adult) (pediatric): Secondary | ICD-10-CM | POA: Diagnosis not present

## 2021-09-16 DIAGNOSIS — R29818 Other symptoms and signs involving the nervous system: Secondary | ICD-10-CM | POA: Diagnosis not present

## 2021-09-16 DIAGNOSIS — R000 Tachycardia, unspecified: Secondary | ICD-10-CM | POA: Diagnosis not present

## 2021-09-23 DIAGNOSIS — G4733 Obstructive sleep apnea (adult) (pediatric): Secondary | ICD-10-CM | POA: Diagnosis not present

## 2021-09-24 ENCOUNTER — Other Ambulatory Visit: Payer: Self-pay | Admitting: Nurse Practitioner

## 2021-09-25 ENCOUNTER — Other Ambulatory Visit: Payer: Self-pay | Admitting: Nurse Practitioner

## 2021-09-27 DIAGNOSIS — Z8546 Personal history of malignant neoplasm of prostate: Secondary | ICD-10-CM | POA: Diagnosis not present

## 2021-10-01 ENCOUNTER — Ambulatory Visit (INDEPENDENT_AMBULATORY_CARE_PROVIDER_SITE_OTHER): Payer: Medicare Other

## 2021-10-01 ENCOUNTER — Other Ambulatory Visit: Payer: Self-pay

## 2021-10-01 DIAGNOSIS — I4891 Unspecified atrial fibrillation: Secondary | ICD-10-CM

## 2021-10-01 DIAGNOSIS — Z5181 Encounter for therapeutic drug level monitoring: Secondary | ICD-10-CM

## 2021-10-01 LAB — POCT INR: INR: 2.6 (ref 2.0–3.0)

## 2021-10-01 NOTE — Patient Instructions (Signed)
Continue warfarin 1.5 tablets daily. Recheck INR in 5 weeks. [facility] [phone].

## 2021-10-04 DIAGNOSIS — Z8546 Personal history of malignant neoplasm of prostate: Secondary | ICD-10-CM | POA: Diagnosis not present

## 2021-10-04 DIAGNOSIS — R350 Frequency of micturition: Secondary | ICD-10-CM | POA: Diagnosis not present

## 2021-10-04 DIAGNOSIS — E291 Testicular hypofunction: Secondary | ICD-10-CM | POA: Diagnosis not present

## 2021-10-05 ENCOUNTER — Other Ambulatory Visit: Payer: Self-pay

## 2021-10-05 ENCOUNTER — Ambulatory Visit (HOSPITAL_BASED_OUTPATIENT_CLINIC_OR_DEPARTMENT_OTHER): Payer: Medicare Other | Admitting: Nurse Practitioner

## 2021-10-05 ENCOUNTER — Ambulatory Visit (INDEPENDENT_AMBULATORY_CARE_PROVIDER_SITE_OTHER): Payer: Medicare Other | Admitting: Nurse Practitioner

## 2021-10-05 ENCOUNTER — Encounter (HOSPITAL_BASED_OUTPATIENT_CLINIC_OR_DEPARTMENT_OTHER): Payer: Self-pay | Admitting: Nurse Practitioner

## 2021-10-05 VITALS — BP 130/62 | HR 69 | Ht 70.0 in | Wt 234.6 lb

## 2021-10-05 DIAGNOSIS — I509 Heart failure, unspecified: Secondary | ICD-10-CM | POA: Diagnosis not present

## 2021-10-05 DIAGNOSIS — N183 Chronic kidney disease, stage 3 unspecified: Secondary | ICD-10-CM | POA: Diagnosis not present

## 2021-10-05 DIAGNOSIS — I130 Hypertensive heart and chronic kidney disease with heart failure and stage 1 through stage 4 chronic kidney disease, or unspecified chronic kidney disease: Secondary | ICD-10-CM

## 2021-10-05 DIAGNOSIS — Z794 Long term (current) use of insulin: Secondary | ICD-10-CM | POA: Diagnosis not present

## 2021-10-05 DIAGNOSIS — R0602 Shortness of breath: Secondary | ICD-10-CM | POA: Diagnosis not present

## 2021-10-05 DIAGNOSIS — Z0000 Encounter for general adult medical examination without abnormal findings: Secondary | ICD-10-CM

## 2021-10-05 DIAGNOSIS — N184 Chronic kidney disease, stage 4 (severe): Secondary | ICD-10-CM

## 2021-10-05 DIAGNOSIS — E1143 Type 2 diabetes mellitus with diabetic autonomic (poly)neuropathy: Secondary | ICD-10-CM | POA: Diagnosis not present

## 2021-10-05 DIAGNOSIS — I255 Ischemic cardiomyopathy: Secondary | ICD-10-CM | POA: Diagnosis not present

## 2021-10-05 DIAGNOSIS — E782 Mixed hyperlipidemia: Secondary | ICD-10-CM

## 2021-10-05 DIAGNOSIS — R531 Weakness: Secondary | ICD-10-CM | POA: Diagnosis not present

## 2021-10-05 DIAGNOSIS — I4891 Unspecified atrial fibrillation: Secondary | ICD-10-CM

## 2021-10-05 DIAGNOSIS — I5022 Chronic systolic (congestive) heart failure: Secondary | ICD-10-CM

## 2021-10-05 DIAGNOSIS — Z23 Encounter for immunization: Secondary | ICD-10-CM

## 2021-10-05 DIAGNOSIS — E1122 Type 2 diabetes mellitus with diabetic chronic kidney disease: Secondary | ICD-10-CM

## 2021-10-05 MED ORDER — ATORVASTATIN CALCIUM 40 MG PO TABS: 40.0000 mg | ORAL_TABLET | Freq: Every day | ORAL | 3 refills | Status: DC

## 2021-10-05 NOTE — Assessment & Plan Note (Addendum)
>>  ASSESSMENT AND PLAN FOR CHRONIC SYSTOLIC CONGESTIVE HEART FAILURE WRITTEN ON 10/05/2021  4:53 PM BY Doran Nestle E, NP  Compliant with medications and treatments.  Followed closely with cardiology  No alarm signs present today on evaluation.  Will monitor.   >>ASSESSMENT AND PLAN FOR EDEMA DUE TO CONGESTIVE HEART FAILURE WRITTEN ON 10/05/2021  4:54 PM BY Emberli Ballester E, NP  Edema has resolved with use of Bumex daily.  No edema present on evaluation today and lungs clear Will follow.

## 2021-10-05 NOTE — Assessment & Plan Note (Signed)
Followed by endocrinology.  Dexcom instruction provided today Hypoglycemic episodes in recent past are concerning.  Referral for nutrition therapy to help with better understanding of meal planning and improved diabetes control.

## 2021-10-05 NOTE — Assessment & Plan Note (Signed)
Followed by endocrinology. 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  Not currently worsening.  Would benefit from PT to help with ambulation and nutrition therapy for improved stable glucose control.

## 2021-10-05 NOTE — Assessment & Plan Note (Signed)
Multiple chronic co-morbidities with recent hospitalization leading to increased weakness and activity intolerance.  Feel that improved mobility and strength would be beneficial to patients overall health and wellness.  Recommend PT services.

## 2021-10-05 NOTE — Assessment & Plan Note (Signed)
Edema has resolved with use of Bumex daily.  No edema present on evaluation today and lungs clear Will follow.

## 2021-10-05 NOTE — Assessment & Plan Note (Addendum)
>>  ASSESSMENT AND PLAN FOR TYPE 2 DIABETES MELLITUS WITH STAGE 3A CHRONIC KIDNEY DISEASE, WITH LONG-TERM CURRENT USE OF INSULIN WRITTEN ON 04/10/2023  8:00 PM BY Melina Mosteller E, NP  >>ASSESSMENT AND PLAN FOR TYPE 2 DIABETES MELLITUS WITH STAGE 3A CHRONIC KIDNEY DISEASE, WITH LONG-TERM CURRENT USE OF INSULIN WRITTEN ON 04/10/2023  8:00 PM BY Reeder Brisby E, NP  >>ASSESSMENT AND PLAN FOR DIABETES WRITTEN ON 10/05/2021  4:59 PM BY Sisto Granillo E, NP  Followed by endocrinology.  New to [facility]. Education today on how to place device and locations that are most appropriate, change times, and safe guarding to ensure that signal is not lost.  Recommend close monitoring especially with hypoglycemic episodes that have occurred.  Education on diet and hypoglycemia is needed- I do feel that his wife would benefit from attending with him. Given age and co-morbidities of the patient, unknown hypoglycemic events could have devastating consequences on the patient.   >>ASSESSMENT AND PLAN FOR TYPE 2 DIABETES MELLITUS WITH DIABETIC AUTONOMIC NEUROPATHY, WITH LONG-TERM CURRENT USE OF INSULIN WRITTEN ON 10/05/2021  5:00 PM BY Branndon Tuite E, NP  Followed by endocrinology. 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  Not currently worsening.  Would benefit from PT to help with ambulation and nutrition therapy for improved stable glucose control.   >>ASSESSMENT AND PLAN FOR CKD (CHRONIC KIDNEY DISEASE) WRITTEN ON 10/05/2021  5:03 PM BY Salli Bodin E, NP  Worsened after recent hospitalization.  Unfortunately, I am concerned that this may continue to worsen with his co-morbid conditions present without intervention and close monitoring.  Referral to nephrology placed for evaluation and recommendations.  Recent hospitalization required "extensive diuresis" and patient did not respond to IV lasix, which is also concerning. Will review records thoroughly for hospital course and recommendations.

## 2021-10-05 NOTE — Assessment & Plan Note (Signed)
Last lipids under control.  Will not repeat labs today Refill atorvastatin 40mg .

## 2021-10-05 NOTE — Assessment & Plan Note (Signed)
BP is stable today. Labs reviewed from hospitalization and recent follow-up which shows worse, but stable kidney function at this time.  Given kidney damage and continued health conditions that will likely contribute to further decline, recommend nephrology referral for evaluation.  Will send new referral today for this.

## 2021-10-05 NOTE — Assessment & Plan Note (Signed)
Shortness of breath on exertion with deconditioning, CHF, not well controlled DM, and CKD.  CPAP at night seems to be helping per family.  I do feel that strengthening and conditioning would be helpful for the patient through continued physical therapy.  Will send referral today. Patient and family live near the [facility] on [address] in [place] and request services there.

## 2021-10-05 NOTE — Assessment & Plan Note (Signed)
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 Labs reviewed from hospitalization and recent follow-up which shows worse, but stable kidney function at this time.  Given kidney damage and continued health conditions that will likely contribute to further decline, recommend nephrology referral for evaluation.  Will send new referral today for this.

## 2021-10-05 NOTE — Progress Notes (Signed)
Seth Render, DNP, AGNP-c Primary Care & Sports Medicine [address]  [place], [place] [address] [phone]  New patient visit   Patient: Seth [name]   DOB: 01/06/1941   80 y.o. Male  MRN: [id number] Visit Date: 10/05/2021  Patient Care Team: Ashyla Luth, Coralee Pesa, NP as PCP - General (Nurse Practitioner) [name], MD as PCP - Cardiology (Cardiology) [name], MD as Attending Physician (Urology) [name], MD as Consulting Physician (Nephrology) [name], [place] (Optometry) Earmon Phoenix, MD as Referring Physician (Dermatology)   Today's healthcare provider: Orma Render, NP   Chief Complaint  Patient presents with   Establish Care    Patient has had a recent hospitalization.   Dr. McAhelany-cardiologist Dr. Jetty Duhamel Dr. Sallye Ober    Subjective    Seth [name] is a 80 y.o. male who presents today as a new patient to establish care. He is with his wife Seth [name] and daughter Seth [name].  HPI HPI     Establish Care    Additional comments: Patient has had a recent hospitalization.   Dr. McAhelany-cardiologist Dr. Jetty Duhamel Dr. Sallye Ober      Last edited by Noel Gerold, CMA on 10/05/2021  1:23 PM.      Seth [name] has a complex medical history cardiac and diabetic concerns and complications. He was previously cared for by [facility] with Seth [name].   He is followed by cardiology for atrial fibrillation, chronic anti-coagulation, CAD, cardiomyopathy, CHF, PVD, HTN, and HLD. ([place])  He is followed by endocrinology for DM2 with micoralbuminuria, neuropathy, angiopathy, and insulin management. [facility]) He has recently been prescribed Dexcom for blood glucose monitoring de to several recent hypoglycemic events, 3 of which required EMS to come and provide support. The family reports he is hypo unaware and often skips meals because he  is not hungry. They would like nutritional education services to help with increasing his awareness of food choices. They are unsure how to use the Dexcom and would like demonstration and advice today on how to use the equipment.   He is followed by urology for urolithiasis, impotence, history of prostate cancer, and renal manifestations associated with DM2. Seth [name]).   He is followed by dermatology for basal cell carcinoma. Seth [name])  Referral was placed for urology recently, but Seth [name] (patients wife) reports that she has been unable to get in touch with them to schedule.   Seth [name] was recently hospitalized in [facility] for vascular engorgement and bilateral renal atrophy. From what they tell me, it sounds like the patient went into respiratory failure and became hypoxic with CO2 levels at 42. He required BiPap for respiratory support. He was send home with CPAP orders, which he started on Saturday. Prior to the hospitalization he was O2 dependent at bedtime.  During his hospitalization he required significant diuresis with IV bumetanide when he failed to diurese with furosemide. He was sent home on PO bumetanide for maintenance therapy.   Following hospitalization, he did have a short term at home PT service, which has now ended. The family reports that this was very helpful for him and he did well, but they feel additional therapy would be helpful. They would like a location where he can go for therapy to help him get out of the house more.    Past Medical History:  Diagnosis Date   Acidosis 07/19/2021   Acute on chronic diastolic (congestive) heart failure ([place]) 09/30/2014  Atrial fibrillation ([place])    a. coumadin;  b. Amiodarone   CAD (coronary artery disease)    a.  Lexiscan Myoview (02/14/14):  Apical, inferolateral scar; ? Mild ischemia; EF 32%.;  b.  LHC (02/21/14):  dLM 100%, LAD 100%, CFX 100%, dRCA 50%. => CABG (L-LAD, S-OM, S-PDA)   Chronic systolic heart failure (HCC)     CKD (chronic kidney disease)    proteinuria   Diabetes mellitus with peripheral autonomic neuropathy ([place]) 09/30/2014   Hemorrhage of gastrointestinal tract, unspecified    Hemorrhage of rectum and anus    History of urinary stone 10/08/2007   HLD (hyperlipidemia)    HTN (hypertension)    Hypertension, benign 06/10/2010   Impotence of organic origin    Ischemic cardiomyopathy    a. Echo (02/17/14):  EF 35-40%, anteroseptal and apical AK, Gr 2 DD, MAC, mod LAE.   Nephrolithiasis    Obesity, unspecified 11/11/2009   Peripheral vascular disease, unspecified ([place])    Prostate cancer ([place])    Proteinuria 09/30/2009   Thrombocytopenia, unspecified ([place])    Tobacco use disorder    Tobacco user 11/11/2009   Type 2 diabetes mellitus with diabetic peripheral angiopathy without gangrene ([place]) 07/19/2021   Type II or unspecified type diabetes mellitus with renal manifestations, uncontrolled(250.42)    Urolithiasis 12/26/1997   Past Surgical History:  Procedure Laterality Date   BACK SURGERY     CATARACT EXTRACTION  2010   CLIPPING OF ATRIAL APPENDAGE Left 02/24/2014   Procedure: CLIPPING OF ATRIAL APPENDAGE;  Surgeon: Gaye Pollack, MD;  Location: [facility];  Service: Open Heart Surgery;  Laterality: Left;   COLONOSCOPY  2005   Normal   CORONARY ARTERY BYPASS GRAFT N/A 02/24/2014   Procedure: Coronary artery bypass graft times three using left internal mammary artery and left leg greater saphenous vein harvested endoscopically.;  Surgeon: Gaye Pollack, MD;  Location: [facility];  Service: Open Heart Surgery;  Laterality: N/A;   INTRAOPERATIVE TRANSESOPHAGEAL ECHOCARDIOGRAM N/A 02/24/2014   Procedure: INTRAOPERATIVE TRANSESOPHAGEAL ECHOCARDIOGRAM;  Surgeon: Gaye Pollack, MD;  Location: [facility] OR;  Service: Open Heart Surgery;  Laterality: N/A;   LEFT HEART CATHETERIZATION WITH CORONARY ANGIOGRAM N/A 02/21/2014   Procedure: LEFT HEART CATHETERIZATION WITH CORONARY ANGIOGRAM;  Surgeon: Peter M Martinique, MD;   Location: [facility] CATH LAB;  Service: Cardiovascular;  Laterality: N/A;   Family Status  Relation Name Status   Mother  Deceased   Father  Deceased at age 51       Heart attack    Sister Webb Silversmith Deceased       unknown   Brother Gene Deceased   Daughter Luis Lopez Alive   Daughter Seth [name] Alive   Daughter Margarita Grizzle Alive   MGM  Deceased   MGF  Deceased   PGM  Deceased   PGF  Deceased   Neg Hx  (Not Specified)   Family History  Problem Relation Age of Onset   Cancer Mother 44       leukemia   Heart disease Father    Heart attack Father 57   Lung cancer Brother    Colon cancer Neg Hx    Rectal cancer Neg Hx    Social History   Socioeconomic History   Marital status: Married    Spouse name: Not on file   Number of children: 3   Years of education: Not on file   Highest education level: Not on file  Occupational History   Occupation: Manufacturing engineer buildings  Tobacco Use   Smoking status: Never   Smokeless tobacco: Former    Types: Chew    Quit date: 02/23/2014  Vaping Use   Vaping Use: Never used  Substance and Sexual Activity   Alcohol use: No   Drug use: No   Sexual activity: Never  Other Topics Concern   Not on file  Social History Narrative   Not on file   Social Determinants of Health   Financial Resource Strain: Not on file  Food Insecurity: Not on file  Transportation Needs: Not on file  Physical Activity: Not on file  Stress: Not on file  Social Connections: Not on file   Outpatient Medications Prior to Visit  Medication Sig   aspirin EC 81 MG tablet Take 1 tablet (81 mg total) by mouth daily.   bumetanide (BUMEX) 1 MG tablet Take 1 mg by mouth daily.   Calcium Carbonate-Vitamin D 600-200 MG-UNIT TABS Take 1 tablet by mouth daily. For bones   carvedilol (COREG) 12.5 MG tablet Take 1 tablet (12.5 mg total) by mouth 2 (two) times daily with a meal.   Continuous Blood Gluc Receiver (DEXCOM G6 RECEIVER) DEVI 1 Device by Does not apply route as directed.    Continuous Blood Gluc Sensor (DEXCOM G6 SENSOR) MISC 1 Device by Does not apply route as directed.   Continuous Blood Gluc Transmit (DEXCOM G6 TRANSMITTER) MISC 1 Device by Does not apply route as directed.   Flaxseed Oil (LINSEED OIL) OIL  Daily, 0 Refill(s)   Flaxseed, Linseed, (FLAXSEED OIL PO) Take 1 tablet by mouth daily.   Insulin Lispro Prot & Lispro (HUMALOG MIX 75/25 KWIKPEN) (75-25) 100 UNIT/ML Kwikpen Inject 36 Units into the skin 2 (two) times daily with a meal.   lisinopril (ZESTRIL) 40 MG tablet Take 1 tablet (40 mg total) by mouth 2 (two) times daily.   metFORMIN (GLUCOPHAGE) 1000 MG tablet TAKE ONE TABLET BY MOUTH TWICE DAILY WITH MEAL   Multiple Vitamin (MULTIVITAMINS PO) Take 1 tablet by mouth daily.   potassium chloride SA (KLOR-CON) 20 MEQ tablet Take 2 tablets (40 mEq total) by mouth daily.   warfarin (COUMADIN) 5 MG tablet Take 1 and 1/2 tablets daily by mouth or as directed by Anticoagulation Clinic.   [DISCONTINUED] atorvastatin (LIPITOR) 40 MG tablet 40 mg.   [DISCONTINUED] cephALEXin (KEFLEX) 500 MG capsule Take 500 mg by mouth 2 (two) times daily.   [DISCONTINUED] furosemide (LASIX) 40 MG tablet 80 mg.   [DISCONTINUED] torsemide (DEMADEX) 20 MG tablet Take 40 mg by mouth daily.   [DISCONTINUED] atorvastatin (LIPITOR) 40 MG tablet TAKE ONE TABLET BY MOUTH ONE TIME DAILY   [DISCONTINUED] torsemide (DEMADEX) 20 MG tablet Take 2 tablets (40 mg total) by mouth daily.   No facility-administered medications prior to visit.   No Known Allergies  Immunization History  Administered Date(s) Administered   Fluad Quad(high Dose 65+) 08/30/2019   Influenza, High Dose Seasonal PF 11/08/2018   Influenza,inj,Quad PF,6+ Mos 08/27/2013, 09/30/2014, 10/02/2015, 09/16/2016, 10/27/2017   Influenza-Unspecified 09/25/2012, 10/05/2020   PFIZER(Purple Top)SARS-COV-2 Vaccination 01/30/2020, 09/21/2020, 10/05/2020, 05/26/2021   Pneumococcal Conjugate-13 06/10/2016   Pneumococcal  Polysaccharide-23 12/09/2010   Td 12/26/2010    Health Maintenance  Topic Date Due   Zoster Vaccines- Shingrix (1 of 2) Never done   OPHTHALMOLOGY EXAM  05/07/2020   TETANUS/TDAP  12/26/2020   INFLUENZA VACCINE  07/26/2021   COVID-19 Vaccine (5 - Booster) 09/25/2021   FOOT EXAM  10/21/2021   HEMOGLOBIN A1C  02/13/2022  Hepatitis C Screening  Completed   HPV VACCINES  Aged Out    Patient Care Team: Shanique Aslinger, Coralee Pesa, NP as PCP - General (Nurse Practitioner) [name], MD as PCP - Cardiology (Cardiology) [name], MD as Attending Physician (Urology) [name], MD as Consulting Physician (Nephrology) [name], [place] (Optometry) Earmon Phoenix, MD as Referring Physician (Dermatology)  Review of Systems All review of systems negative except what is listed in the HPI    Objective    BP 130/62   Pulse 69   Ht _0  (1.778 m)   Wt 234 lb 9.6 oz (106.4 kg)   SpO2 97%   BMI 33.66 kg/m  Physical Exam Vitals and nursing note reviewed.  Constitutional:      General: He is not in acute distress.    Appearance: He is obese. He is ill-appearing.  HENT:     Head: Normocephalic.  Eyes:     Extraocular Movements: Extraocular movements intact.     Conjunctiva/sclera: Conjunctivae normal.     Pupils: Pupils are equal, round, and reactive to light.  Neck:     Vascular: No carotid bruit.  Cardiovascular:     Rate and Rhythm: Normal rate. Rhythm irregular.     Chest Wall: PMI is not displaced.     Pulses: Normal pulses.     Heart sounds: Normal heart sounds. No murmur heard. Pulmonary:     Effort: Pulmonary effort is normal. No respiratory distress.     Breath sounds: No wheezing or rhonchi.  Chest:     Chest wall: No tenderness.  Abdominal:     Palpations: Abdomen is soft.  Musculoskeletal:     Cervical back: No tenderness.     Right lower leg: No edema.     Left lower leg: No edema.     Comments: Generalized weakness present. Ambulating with  cane  Lymphadenopathy:     Cervical: No cervical adenopathy.  Skin:    General: Skin is warm and dry.     Capillary Refill: Capillary refill takes less than 2 seconds.          Comments: No edema or weeping present today. Skin loose in LE.   Neurological:     Mental Status: He is alert. Mental status is at baseline.     Cranial Nerves: No cranial nerve deficit.     Sensory: Sensory deficit present.     Motor: Weakness present.     Gait: Gait abnormal.     Depression Screen PHQ 2/9 Scores 10/05/2021 10/21/2020 06/17/2020 06/11/2019  PHQ - 2 Score 0 0 0 0  PHQ- 9 Score 0 - - -   No results found for any visits on 10/05/21.  Assessment & Plan      Problem List Items Addressed This Visit     Diabetes ([place])    Followed by endocrinology.  New to [facility]. Education today on how to place device and locations that are most appropriate, change times, and safe guarding to ensure that signal is not lost.  Recommend close monitoring especially with hypoglycemic episodes that have occurred.  Education on diet and hypoglycemia is needed- I do feel that his wife would benefit from attending with him. Given age and co-morbidities of the patient, unknown hypoglycemic events could have devastating consequences on the patient.       Relevant Medications   atorvastatin (LIPITOR) 40 MG tablet   Hyperlipidemia, unspecified    Last lipids under control.  Will not repeat labs  today Refill atorvastatin 23m.       Relevant Medications   atorvastatin (LIPITOR) 40 MG tablet   bumetanide (BUMEX) 1 MG tablet   CKD (chronic kidney disease)    Worsened after recent hospitalization.  Unfortunately, I am concerned that this may continue to worsen with his co-morbid conditions present without intervention and close monitoring.  Referral to nephrology placed for evaluation and recommendations.  Recent hospitalization required "extensive diuresis" and patient did not respond to IV lasix, which is also  concerning. Will review records thoroughly for hospital course and recommendations.       Relevant Orders   Ambulatory referral to Nephrology   Unspecified atrial fibrillation ([facility]    Followed by cardiology Chronic anticoagulation with monitoring in place No concerns today Will follow.       Relevant Medications   atorvastatin (LIPITOR) 40 MG tablet   bumetanide (BUMEX) 1 MG tablet   Ischemic cardiomyopathy   Relevant Medications   atorvastatin (LIPITOR) 40 MG tablet   bumetanide (BUMEX) 1 MG tablet   Chronic systolic congestive heart failure (HCC)    Compliant with medications and treatments.  Followed closely with cardiology  No alarm signs present today on evaluation.  Will monitor.       Relevant Medications   atorvastatin (LIPITOR) 40 MG tablet   bumetanide (BUMEX) 1 MG tablet   Type 2 diabetes mellitus with diabetic autonomic neuropathy, with long-term current use of insulin ([place]    Followed by endocrinology. 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  Not currently worsening.  Would benefit from PT to help with ambulation and nutrition therapy for improved stable glucose control.       Relevant Medications   atorvastatin (LIPITOR) 40 MG tablet   Edema due to congestive heart failure (HCC)    Edema has resolved with use of Bumex daily.  No edema present on evaluation today and lungs clear Will follow.       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 Labs reviewed from hospitalization and recent follow-up which shows worse, but stable kidney function at this time.  Given kidney damage and continued health conditions that will likely contribute to further decline, recommend nephrology referral for evaluation.  Will  send new referral today for this.       Relevant Medications   atorvastatin (LIPITOR) 40 MG tablet   bumetanide (BUMEX) 1 MG tablet   Long term (current) use of insulin ([place]    Followed by endocrinology.  Dexcom instruction provided today Hypoglycemic episodes in recent past are concerning.  Referral for nutrition therapy to help with better understanding of meal planning and improved diabetes control.       Shortness of breath    Shortness of breath on exertion with deconditioning, CHF, not well controlled DM, and CKD.  CPAP at night seems to be helping per family.  I do feel that strengthening and conditioning would be helpful for the patient through continued physical therapy.  Will send referral today. [place]       Weakness generalized    Multiple chronic co-morbidities with recent hospitalization leading to increased weakness and activity intolerance.  Feel that improved mobility and strength would be beneficial to patients overall health and wellness.  Recommend PT services.       Other Visit Diagnoses     Encounter for medical examination to establish care    -  Primary        Return in about 6 months (around 04/05/2022) for Chronic management.     Time: 65 minutes, >50% spent counseling, care coordination, chart review, and documentation.   Bena Kobel, Coralee Pesa, NP, DNP, AGNP-C Primary Care & Sports Medicine at [place]

## 2021-10-05 NOTE — Assessment & Plan Note (Signed)
Followed by endocrinology.  New to [facility]. Education today on how to place device and locations that are most appropriate, change times, and safe guarding to ensure that signal is not lost.  Recommend close monitoring especially with hypoglycemic episodes that have occurred.  Education on diet and hypoglycemia is needed- I do feel that his wife would benefit from attending with him. Given age and co-morbidities of the patient, unknown hypoglycemic events could have devastating consequences on the patient.

## 2021-10-05 NOTE — Patient Instructions (Signed)
Recommendations from today's visit: Sent refill in today for atorvastatin.  We will plan to have a Medicare wellness visit next July  Information on diet, exercise, and health maintenance recommendations are listed below. This is information to help you be sure you are on track for optimal health and monitoring.   Please look over this and let us know if you have any questions or if you have completed any of the health maintenance outside of [place] so that we can be sure your records are up to date.  ___________________________________________________________  Thank you for choosing [facility] for your Primary Care needs. I am excited for the opportunity to partner with you to meet your health care goals. It was a pleasure meeting you today!  I am an Adult-Geriatric Nurse Practitioner with a background in caring for patients for more than 20 years. I provide primary care and sports medicine services to patients age 4 and older within this office. I am also the director of the APP Fellowship with [facility].   I am passionate about providing the best service to you through preventive medicine and supportive care. I consider you a part of the medical team and value your input. I work diligently to ensure that you are heard and your needs are met in a safe and effective manner. I want you to feel comfortable with me as your provider and want you to know that your health concerns are important to me.  For your information, our office hours are Monday- Friday 8:00 AM - 5:00 PM At this time I am not in the office on Wednesdays.  If you have questions or concerns, please call our office at [phone] or send [place] a MyChart message and we will respond as quickly as possible.   For all urgent or time sensitive needs we ask that you please call the office to avoid delays. MyChart is not constantly monitored and replies may take up to 72 business hours.  MyChart  Policy: MyChart allows for you to see your visit notes, after visit summary, provider recommendations, lab and tests results, make an appointment, request refills, and contact your provider or the office for non-urgent questions or concerns. Providers are seeing patients during normal business hours and do not have built in time to review MyChart messages.  We ask that you allow a minimum of 4 business days for responses to [employer]. For this reason, please do not send urgent requests through [place]. Please call the office at [phone]. Complex MyChart concerns may require a visit. Your provider may request you schedule a virtual or in person visit to ensure we are providing the best care possible. MyChart messages sent after 4:00 PM on Friday will not be received by the provider until Monday morning.    Lab and Test Results: You will receive your lab and test results on MyChart as soon as they are completed and results have been sent by the lab or testing facility. Due to this service, you will receive your results BEFORE your provider.  I review lab and tests results each morning prior to seeing patients. Some results require collaboration with other providers to ensure you are receiving the most appropriate care. For this reason, we ask that you please allow a minimum of 4 business days for your provider to receive and review lab and test results and contact you about these.  Most lab and test result comments from the provider will be sent  through [place]. Your provider may recommend changes to the plan of care, follow-up visits, repeat testing, ask questions, or request an office visit to discuss these results. You may reply directly to this message or call the office at [phone] to provide information for the provider or set up an appointment. In some instances, you will be called with test results and recommendations. Please let us know if this is preferred and we will make note of  this in your chart to provide this for you.    If you have not heard a response to your lab or test results in 72 business hours, please call the office to let us know.   After Hours: For all non-emergency after hours needs, please call the office at [phone] and select the option to reach the on-call provider service. On-call services are shared between multiple [facility] offices and therefore it will not be possible to speak directly with your provider. On-call providers may provide medical advice and recommendations, but are unable to provide refills for maintenance medications.  For all emergency or urgent medical needs after normal business hours, we recommend that you seek care at the closest Urgent Care or Emergency Department to ensure appropriate treatment in a timely manner.  [facility] [place] has a 24 hour emergency room located on the ground floor for your convenience.    Please do not hesitate to reach out to [place] with concerns.   Thank you, again, for choosing me as your health care partner. I appreciate your trust and look forward to learning more about you.   Worthy Keeler, DNP, AGNP-c ___________________________________________________________  Health Maintenance Recommendations Screening Testing Mammogram Every 1 -2 years based on history and risk factors Starting at age 23 Pap Smear Ages 21-39 every 3 years Ages 30-65 every 5 years with HPV testing More frequent testing may be required based on results and history Colon Cancer Screening Every 1-10 years based on test performed, risk factors, and history Starting at age 37 Bone Density Screening Every 2-10 years based on history Starting at age 80 for women Recommendations for men differ based on medication usage, history, and risk factors AAA Screening One time ultrasound Men 77-59 years old who have every smoked Lung Cancer Screening Low Dose Lung CT every 12 months Age 36-80 years with a  30 pack-year smoking history who still smoke or who have quit within the last 15 years  Screening Labs Routine  Labs: Complete Blood Count (CBC), Complete Metabolic Panel (CMP), Cholesterol (Lipid Panel) Every 6-12 months based on history and medications May be recommended more frequently based on current conditions or previous results Hemoglobin A1c Lab Every 3-12 months based on history and previous results Starting at age 39 or earlier with diagnosis of diabetes, high cholesterol, BMI >26, and/or risk factors Frequent monitoring for patients with diabetes to ensure blood sugar control Thyroid Panel (TSH w/ T3 & T4) Every 6 months based on history, symptoms, and risk factors May be repeated more often if on medication HIV One time testing for all patients 21 and older May be repeated more frequently for patients with increased risk factors or exposure Hepatitis C One time testing for all patients 69 and older May be repeated more frequently for patients with increased risk factors or exposure Gonorrhea, Chlamydia Every 12 months for all sexually active persons 13-24 years Additional monitoring may be recommended for those who are considered high risk or who have symptoms PSA Men 68-54 years old with risk  factors Additional screening may be recommended from age 66-69 based on risk factors, symptoms, and history  Vaccine Recommendations Tetanus Booster All adults every 10 years Flu Vaccine All patients 6 months and older every year COVID Vaccine All patients 12 years and older Initial dosing with booster May recommend additional booster based on age and health history HPV Vaccine 2 doses all patients age 57-26 Dosing may be considered for patients over 26 Shingles Vaccine (Shingrix) 2 doses all adults 74 years and older Pneumonia (Pneumovax 23) All adults 101 years and older May recommend earlier dosing based on health history Pneumonia (Prevnar 76) All adults 74 years and  older Dosed 1 year after Pneumovax 23  Additional Screening, Testing, and Vaccinations may be recommended on an individualized basis based on family history, health history, risk factors, and/or exposure.  __________________________________________________________  Diet Recommendations for All Patients  I recommend that all patients maintain a diet low in saturated fats, carbohydrates, and cholesterol. While this can be challenging at first, it is not impossible and small changes can make big differences.  Things to try: Decreasing the amount of soda, sweet tea, and/or juice to one or less per day and replace with water While water is always the first choice, if you do not like water you may consider adding a water additive without sugar to improve the taste other sugar free drinks Replace potatoes with a brightly colored vegetable at dinner Use healthy oils, such as canola oil or olive oil, instead of butter or hard margarine Limit your bread intake to two pieces or less a day Replace regular pasta with low carb pasta options Bake, broil, or grill foods instead of frying Monitor portion sizes  Eat smaller, more frequent meals throughout the day instead of large meals  An important thing to remember is, if you love foods that are not great for your health, you don't have to give them up completely. Instead, allow these foods to be a reward when you have done well. Allowing yourself to still have special treats every once in a while is a nice way to tell yourself thank you for working hard to keep yourself healthy.   Also remember that every day is a new day. If you have a bad day and "fall off the wagon", you can still climb right back up and keep moving along on your journey!  We have resources available to help you!  Some websites that may be helpful include: www.http://carter.biz/  Www.[employer]Well[employer].com _____________________________________________________________  Activity Recommendations  for All Patients  I recommend that all adults get at least 20 minutes of moderate physical activity that elevates your heart rate at least 5 days out of the week.  Some examples include: Walking or jogging at a pace that allows you to carry on a conversation Cycling (stationary bike or outdoors) Water aerobics Yoga Weight lifting Dancing If physical limitations prevent you from putting stress on your joints, exercise in a pool or seated in a chair are excellent options.  Do determine your MAXIMUM heart rate for activity: YOUR AGE - 220 = MAX HeartRate   Remember! Do not push yourself too hard.  Start slowly and build up your pace, speed, weight, time in exercise, etc.  Allow your body to rest between exercise and get good sleep. You will need more water than normal when you are exerting yourself. Do not wait until you are thirsty to drink. Drink with a purpose of getting in at least 8, 8 ounce glasses of  water a day plus more depending on how much you exercise and sweat.    If you begin to develop dizziness, chest pain, abdominal pain, jaw pain, shortness of breath, headache, vision changes, lightheadedness, or other concerning symptoms, stop the activity and allow your body to rest. If your symptoms are severe, seek emergency evaluation immediately. If your symptoms are concerning, but not severe, please let us know so that we can recommend further evaluation.   ________________________________________________________________

## 2021-10-05 NOTE — Assessment & Plan Note (Signed)
Followed by cardiology Chronic anticoagulation with monitoring in place No concerns today Will follow.

## 2021-10-05 NOTE — Assessment & Plan Note (Signed)
Worsened after recent hospitalization.  Unfortunately, I am concerned that this may continue to worsen with his co-morbid conditions present without intervention and close monitoring.  Referral to nephrology placed for evaluation and recommendations.  Recent hospitalization required "extensive diuresis" and patient did not respond to IV lasix, which is also concerning. Will review records thoroughly for hospital course and recommendations.

## 2021-10-06 ENCOUNTER — Other Ambulatory Visit: Payer: Self-pay | Admitting: *Deleted

## 2021-10-06 MED ORDER — WARFARIN SODIUM 5 MG PO TABS: ORAL_TABLET | ORAL | 1 refills | Status: DC

## 2021-10-07 DIAGNOSIS — Z23 Encounter for immunization: Secondary | ICD-10-CM | POA: Diagnosis not present

## 2021-10-15 ENCOUNTER — Ambulatory Visit: Payer: Medicare Other | Admitting: Nurse Practitioner

## 2021-10-25 ENCOUNTER — Other Ambulatory Visit: Payer: Self-pay | Admitting: Cardiovascular Disease

## 2021-10-29 ENCOUNTER — Ambulatory Visit (INDEPENDENT_AMBULATORY_CARE_PROVIDER_SITE_OTHER): Payer: Medicare Other

## 2021-10-29 ENCOUNTER — Other Ambulatory Visit: Payer: Self-pay

## 2021-10-29 DIAGNOSIS — I4891 Unspecified atrial fibrillation: Secondary | ICD-10-CM | POA: Diagnosis not present

## 2021-10-29 DIAGNOSIS — Z5181 Encounter for therapeutic drug level monitoring: Secondary | ICD-10-CM

## 2021-10-29 LAB — POCT INR: INR: 2 (ref 2.0–3.0)

## 2021-10-29 NOTE — Patient Instructions (Signed)
Continue warfarin 1.5 tablets daily. Recheck INR in 6 weeks. [facility] [phone].

## 2021-11-10 DIAGNOSIS — Z8546 Personal history of malignant neoplasm of prostate: Secondary | ICD-10-CM | POA: Diagnosis not present

## 2021-11-10 DIAGNOSIS — Z951 Presence of aortocoronary bypass graft: Secondary | ICD-10-CM | POA: Diagnosis not present

## 2021-11-10 DIAGNOSIS — N1831 Chronic kidney disease, stage 3a: Secondary | ICD-10-CM | POA: Diagnosis not present

## 2021-11-10 DIAGNOSIS — I2510 Atherosclerotic heart disease of native coronary artery without angina pectoris: Secondary | ICD-10-CM | POA: Diagnosis not present

## 2021-11-10 DIAGNOSIS — N2581 Secondary hyperparathyroidism of renal origin: Secondary | ICD-10-CM | POA: Diagnosis not present

## 2021-11-10 DIAGNOSIS — E1122 Type 2 diabetes mellitus with diabetic chronic kidney disease: Secondary | ICD-10-CM | POA: Diagnosis not present

## 2021-11-10 DIAGNOSIS — I4891 Unspecified atrial fibrillation: Secondary | ICD-10-CM | POA: Diagnosis not present

## 2021-11-10 DIAGNOSIS — I129 Hypertensive chronic kidney disease with stage 1 through stage 4 chronic kidney disease, or unspecified chronic kidney disease: Secondary | ICD-10-CM | POA: Diagnosis not present

## 2021-11-12 ENCOUNTER — Encounter: Payer: Self-pay | Admitting: Internal Medicine

## 2021-11-12 ENCOUNTER — Ambulatory Visit (INDEPENDENT_AMBULATORY_CARE_PROVIDER_SITE_OTHER): Payer: Medicare Other | Admitting: Internal Medicine

## 2021-11-12 ENCOUNTER — Other Ambulatory Visit: Payer: Self-pay

## 2021-11-12 VITALS — BP 132/80 | HR 67 | Ht 70.0 in | Wt 235.0 lb

## 2021-11-12 DIAGNOSIS — E1129 Type 2 diabetes mellitus with other diabetic kidney complication: Secondary | ICD-10-CM

## 2021-11-12 DIAGNOSIS — E1122 Type 2 diabetes mellitus with diabetic chronic kidney disease: Secondary | ICD-10-CM

## 2021-11-12 DIAGNOSIS — Z794 Long term (current) use of insulin: Secondary | ICD-10-CM

## 2021-11-12 DIAGNOSIS — E1152 Type 2 diabetes mellitus with diabetic peripheral angiopathy with gangrene: Secondary | ICD-10-CM

## 2021-11-12 DIAGNOSIS — R809 Proteinuria, unspecified: Secondary | ICD-10-CM | POA: Diagnosis not present

## 2021-11-12 DIAGNOSIS — N1831 Chronic kidney disease, stage 3a: Secondary | ICD-10-CM | POA: Diagnosis not present

## 2021-11-12 DIAGNOSIS — E785 Hyperlipidemia, unspecified: Secondary | ICD-10-CM

## 2021-11-12 DIAGNOSIS — E1143 Type 2 diabetes mellitus with diabetic autonomic (poly)neuropathy: Secondary | ICD-10-CM

## 2021-11-12 DIAGNOSIS — E1165 Type 2 diabetes mellitus with hyperglycemia: Secondary | ICD-10-CM

## 2021-11-12 LAB — COMPREHENSIVE METABOLIC PANEL
ALT: 14 U/L (ref 0–53)
AST: 16 U/L (ref 0–37)
Albumin: 3.9 g/dL (ref 3.5–5.2)
Alkaline Phosphatase: 54 U/L (ref 39–117)
BUN: 33 mg/dL — ABNORMAL HIGH (ref 6–23)
CO2: 30 mEq/L (ref 19–32)
Calcium: 9.3 mg/dL (ref 8.4–10.5)
Chloride: 105 mEq/L (ref 96–112)
Creatinine, Ser: 1.35 mg/dL (ref 0.40–1.50)
GFR: 49.98 mL/min — ABNORMAL LOW (ref >60.00)
Glucose, Bld: 237 mg/dL — ABNORMAL HIGH (ref 70–99)
Potassium: 4.1 mEq/L (ref 3.5–5.1)
Sodium: 142 mEq/L (ref 135–145)
Total Bilirubin: 0.5 mg/dL (ref 0.2–1.2)
Total Protein: 6.8 g/dL (ref 6.0–8.3)

## 2021-11-12 LAB — CBC
HCT: 36.2 % — ABNORMAL LOW (ref 39.0–52.0)
Hemoglobin: 11.7 g/dL — ABNORMAL LOW (ref 13.0–17.0)
MCHC: 32.3 g/dL (ref 30.0–36.0)
MCV: 84.4 fl (ref 78.0–100.0)
Platelets: 140 10*3/uL — ABNORMAL LOW (ref 150.0–400.0)
RBC: 4.28 Mil/uL (ref 4.22–5.81)
RDW: 17 % — ABNORMAL HIGH (ref 11.5–15.5)
WBC: 7.4 10*3/uL (ref 4.0–10.5)

## 2021-11-12 LAB — POCT GLYCOSYLATED HEMOGLOBIN (HGB A1C): Hemoglobin A1C: 7 % — AB (ref 4.0–5.6)

## 2021-11-12 LAB — PHOSPHORUS: Phosphorus: 3.2 mg/dL (ref 2.3–4.6)

## 2021-11-12 LAB — LIPID PANEL
Cholesterol: 101 mg/dL (ref 0–200)
HDL: 37.1 mg/dL — ABNORMAL LOW (ref >39.00)
NonHDL: 64.27
Total CHOL/HDL Ratio: 3
Triglycerides: 208 mg/dL — ABNORMAL HIGH (ref 0.0–149.0)
VLDL: 41.6 mg/dL — ABNORMAL HIGH (ref 0.0–40.0)

## 2021-11-12 LAB — MICROALBUMIN / CREATININE URINE RATIO
Creatinine,U: 19.7 mg/dL
Microalb Creat Ratio: 133 mg/g — ABNORMAL HIGH (ref 0.0–30.0)
Microalb, Ur: 26.2 mg/dL — ABNORMAL HIGH (ref 0.0–1.9)

## 2021-11-12 LAB — LDL CHOLESTEROL, DIRECT: Direct LDL: 41 mg/dL

## 2021-11-12 NOTE — Progress Notes (Signed)
Name: Seth [name]  Age/ Sex: 80 y.o., male   MRN/ DOB: [id number], [birth year]     PCP: Orma Render, NP   Reason for Endocrinology Evaluation: Type 2 Diabetes Mellitus  Initial Endocrine Consultative Visit: 10/09/2019    PATIENT IDENTIFIER: Mr. CAN LUCCI is a 80 y.o. male with a past medical history of T2DM, HTN, PVD, CAD, A.Fib and Prostate Ca ( S/P chemo and radiation). The patient has followed with Endocrinology clinic since 10/09/2019 for consultative assistance with management of his diabetes.      DIABETIC HISTORY:  Mr. Magnone was diagnosed with T2DM many years ago.  He was prescribed Jardiance in the past, but unable to use due to cost.  hemoglobin A1c has ranged from 6.4% in 2015, peaking at 11.7% in 2020 On his initial visit to our clinic his A1c was 9.7%, he was on glipizide, Toujeo, and metformin.  We switched his Toujeo to Humalog mix, we stopped glipizide and continued Metformin.  Lives with wife  SUBJECTIVE:   During the last visit (04/07/2021): A1c 8.1 %.  We increased Humalog mix and continued Metformin  Today (11/12/2021): Mr. Massi is here for a follow-up on diabetes management. Accompanied by wife  . He checks his blood sugars multiple times a day through the CGM. The patient has had hypoglycemic episodes since the last clinic visit, which required his wife to reduce insulin doses  Per wife he is eating to keep up with his insulin and prevent hypoglycemia  Denies lower extremity edema  Denies SOB - he is on BIPAP   He is going through PT  Denies SOB , has been decreasing  He also sees urology     HOME DIABETES REGIMEN:  Humalog Mix 30 units with Breakfast and 30 units with Supper  Metformin 1000 mg BID     Statin: Yes ACE-I/ARB: Yes     CONTINUOUS GLUCOSE MONITORING RECORD INTERPRETATION    Dates of Recording: 11/5-11/18/2022  Sensor description: dexcom  Results statistics:   CGM use % of time 100  Average and SD 174/47  Time  in range      56  %  % Time Above 180 37  % Time above 250 5  % Time Below target 1     Glycemic patterns summary: Hyperglycemia noted during the day ,and optimal at night  Hyperglycemic episodes  post breakfast  Hypoglycemic episodes occurred resolved   Overnight periods: stable       DIABETIC COMPLICATIONS: Microvascular complications:  Neuropathy  Denies: CKD, retinopathy  Last eye exam: Completed 12/2019   Macrovascular complications:  CAD, PVD Denies: CVA   HISTORY:  Past Medical History:  Past Medical History:  Diagnosis Date   Acidosis 07/19/2021   Acute on chronic diastolic (congestive) heart failure ([place]) 09/30/2014   Atrial fibrillation ([place])    a. coumadin;  b. Amiodarone   CAD (coronary artery disease)    a.  Lexiscan Myoview (02/14/14):  Apical, inferolateral scar; ? Mild ischemia; EF 32%.;  b.  LHC (02/21/14):  dLM 100%, LAD 100%, CFX 100%, dRCA 50%. => CABG (L-LAD, S-OM, S-PDA)   Chronic systolic heart failure (H[facility])    CKD (chronic kidney disease)    proteinuria   Diabetes mellitus with peripheral autonomic neuropathy ([place]) 09/30/2014   Hemorrhage of gastrointestinal tract, unspecified    Hemorrhage of rectum and anus    History of urinary stone 10/08/2007   HLD (hyperlipidemia)    HTN (hypertension)    Hypertension,  benign 06/10/2010   Impotence of organic origin    Ischemic cardiomyopathy    a. Echo (02/17/14):  EF 35-40%, anteroseptal and apical AK, Gr 2 DD, MAC, mod LAE.   Nephrolithiasis    Obesity, unspecified 11/11/2009   Peripheral vascular disease, unspecified ([place])    Prostate cancer ([place])    Proteinuria 09/30/2009   Thrombocytopenia, unspecified ([place])    Tobacco use disorder    Tobacco user 11/11/2009   Type 2 diabetes mellitus with diabetic peripheral angiopathy without gangrene ([place]) 07/19/2021   Type II or unspecified type diabetes mellitus with renal manifestations, uncontrolled(250.42)    Urolithiasis 12/26/1997   Past Surgical  History:  Past Surgical History:  Procedure Laterality Date   BACK SURGERY     CATARACT EXTRACTION  2010   CLIPPING OF ATRIAL APPENDAGE Left 02/24/2014   Procedure: CLIPPING OF ATRIAL APPENDAGE;  Surgeon: Gaye Pollack, MD;  Location: [facility];  Service: Open Heart Surgery;  Laterality: Left;   COLONOSCOPY  2005   Normal   CORONARY ARTERY BYPASS GRAFT N/A 02/24/2014   Procedure: Coronary artery bypass graft times three using left internal mammary artery and left leg greater saphenous vein harvested endoscopically.;  Surgeon: Gaye Pollack, MD;  Location: [facility];  Service: Open Heart Surgery;  Laterality: N/A;   INTRAOPERATIVE TRANSESOPHAGEAL ECHOCARDIOGRAM N/A 02/24/2014   Procedure: INTRAOPERATIVE TRANSESOPHAGEAL ECHOCARDIOGRAM;  Surgeon: Gaye Pollack, MD;  Location: [facility] OR;  Service: Open Heart Surgery;  Laterality: N/A;   LEFT HEART CATHETERIZATION WITH CORONARY ANGIOGRAM N/A 02/21/2014   Procedure: LEFT HEART CATHETERIZATION WITH CORONARY ANGIOGRAM;  Surgeon: Peter M Martinique, MD;  Location: [facility] CATH LAB;  Service: Cardiovascular;  Laterality: N/A;   Social History:  reports that he has never smoked. He quit smokeless tobacco use about 7 years ago.  His smokeless tobacco use included chew. He reports that he does not drink alcohol and does not use drugs. Family History:  Family History  Problem Relation Age of Onset   Cancer Mother 35       leukemia   Heart disease Father    Heart attack Father 32   Lung cancer Brother    Colon cancer Neg Hx    Rectal cancer Neg Hx      HOME MEDICATIONS: Allergies as of 11/12/2021   No Known Allergies      Medication List        Accurate as of November 12, 2021  2:12 PM. If you have any questions, ask your nurse or doctor.          aspirin EC 81 MG tablet Take 1 tablet (81 mg total) by mouth daily.   atorvastatin 40 MG tablet Commonly known as: LIPITOR Take 1 tablet (40 mg total) by mouth daily.   bumetanide 1 MG tablet Commonly known  as: BUMEX Take 1 mg by mouth daily.   Calcium Carbonate-Vitamin D 600-200 MG-UNIT Tabs Take 1 tablet by mouth daily. For bones   carvedilol 12.5 MG tablet Commonly known as: COREG TAKE 1 TABLET BY MOUTH TWICE DAILY WITH A MEAL   Dexcom G6 Receiver Devi 1 Device by Does not apply route as directed.   Dexcom G6 Sensor Misc 1 Device by Does not apply route as directed.   Dexcom G6 Transmitter Misc 1 Device by Does not apply route as directed.   FLAXSEED OIL PO Take 1 tablet by mouth daily.   Insulin Lispro Prot & Lispro (75-25) 100 UNIT/ML Kwikpen Commonly known as: HumaLOG Mix  75/25 KwikPen Inject 36 Units into the skin 2 (two) times daily with a meal. What changed: how much to take   Linseed Oil Oil Daily, 0 Refill(s)   lisinopril 40 MG tablet Commonly known as: ZESTRIL Take 1 tablet (40 mg total) by mouth 2 (two) times daily.   metFORMIN 1000 MG tablet Commonly known as: GLUCOPHAGE TAKE ONE TABLET BY MOUTH TWICE DAILY WITH MEAL   MULTIVITAMINS PO Take 1 tablet by mouth daily.   potassium chloride SA 20 MEQ tablet Commonly known as: KLOR-CON Take 2 tablets (40 mEq total) by mouth daily.   torsemide 20 MG tablet Commonly known as: DEMADEX Take 20 mg by mouth daily.   warfarin 5 MG tablet Commonly known as: COUMADIN Take as directed by the anticoagulation clinic. If you are unsure how to take this medication, talk to your nurse or doctor. Original instructions: Take 1 and 1/2 tablets daily by mouth or as directed by Anticoagulation Clinic.         OBJECTIVE:   Vital Signs: BP 132/80 (BP Location: Left Arm, Patient Position: Sitting, Cuff Size: Small)   Pulse 67   Ht _0  (1.778 m)   Wt 235 lb (106.6 kg)   SpO2 98%   BMI 33.72 kg/m   Wt Readings from Last 3 Encounters:  11/12/21 235 lb (106.6 kg)  10/05/21 234 lb 9.6 oz (106.4 kg)  08/17/21 233 lb (105.7 kg)     Exam: General: Pt appears well and is in NAD  Lungs: Clear with good BS bilat  with no rales, rhonchi, or wheezes  Heart: RRR with normal S1 and S2 and no gallops; no murmurs; no rub  Extremities:  No pretibial edema..  Neuro: MS is good with appropriate affect, pt is alert and Ox3       DM foot exam: 04/07/2021   The skin of the feet is  without sores or ulcerations. The pedal pulses are 1+ on right and 1+ on left. The sensation is absent to a screening 5.07, 10 gram monofilament bilaterally   DATA REVIEWED:  Lab Results  Component Value Date   HGBA1C 7.0 (A) 11/12/2021   HGBA1C 6.9 (A) 08/13/2021   HGBA1C 8.1 (A) 04/07/2021     Latest Reference Range & Units 11/12/21 14:38  COMPREHENSIVE METABOLIC PANEL  Rpt !  Sodium 135 - 145 mEq/L 142  Potassium 3.5 - 5.1 mEq/L 4.1  Chloride 96 - 112 mEq/L 105  CO2 19 - 32 mEq/L 30  Glucose 70 - 99 mg/dL 237 (H)  BUN 6 - 23 mg/dL 33 (H)  Creatinine 0.40 - 1.50 mg/dL 1.35  Calcium 8.4 - 10.5 mg/dL 9.3  Phosphorus 2.3 - 4.6 mg/dL 3.2  Alkaline Phosphatase 39 - 117 U/L 54  Albumin 3.5 - 5.2 g/dL 3.9  AST 0 - 37 U/L 16  ALT 0 - 53 U/L 14  Total Protein 6.0 - 8.3 g/dL 6.8  Total Bilirubin 0.2 - 1.2 mg/dL 0.5  GFR >60.00 mL/min 49.98 (L)  Total CHOL/HDL Ratio  3  Cholesterol 0 - 200 mg/dL 101  HDL Cholesterol >39.00 mg/dL 37.10 (L)  Direct LDL mg/dL 41.0    Latest Reference Range & Units 11/12/21 14:38  NonHDL  64.27  Triglycerides 0.0 - 149.0 mg/dL 208.0 (H)  VLDL 0.0 - 40.0 mg/dL 41.6 (H)  WBC 4.0 - 10.5 K/uL 7.4  RBC 4.22 - 5.81 Mil/uL 4.28  Hemoglobin 13.0 - 17.0 g/dL 11.7 (L)  HCT 39.0 - 52.0 % 36.2 (L)  MCV 78.0 - 100.0 fl 84.4  MCHC 30.0 - 36.0 g/dL 32.3  RDW 11.5 - 15.5 % 17.0 (H)  Platelets 150.0 - 400.0 K/uL 140.0 (L)  Glucose 70 - 99 mg/dL 237 (H)    Latest Reference Range & Units 11/12/21 15:19  Creatinine,U mg/dL 19.7  Microalb, Ur 0.0 - 1.9 mg/dL 26.2 (H)  MICROALB/CREAT RATIO 0.0 - 30.0 mg/g 133.0 (H)    07/19/2021  A1c 8.9% Na 142 K 3.6 BUN/Cr 51/1.45 GFR 47   ASSESSMENT /  PLAN / RECOMMENDATIONS:   1) Type 2 Diabetes Mellitus,Optimally  controlled, With neuropathic, microalbuminurea and macrovascular complications - Most recent A1c of 7.0 %. Goal A1c <7.0%.  -Patient had hypoglycemic episodes but this has improved since his wife reduced insulin doses, per wife the patient has been eating to keep up with his insulin and prevent hypoglycemia -CGM download for the past 2 weeks shows optimal BG's overnight but hyperglycemia after breakfast -We discussed the limitations of insulin mix and lack of flexibility as well as higher risk of hypoglycemia, I have recommended switching to basal/prandial insulin regimen -At this time they would like to continue on the insulin mix, will reduce morning dose as below -I have emphasized the importance of taking insulin mix with the first and last meal of the day and not after as that will increase the risk of hypoglycemia  - Did not qualify for Jardiance pt assistance , will try again 2023  as he is a great candidate given hx of CHF, there were provided with patient assistance forms today to fill up in the beginning of the year -We discussed importance of GFR monitoring as his metformin dose will need to be adjusted accordingly - Pt has no medicare plan D - BMP shows stable GFR > 45 , no changes to metformin dose    MEDICATIONS: Decrease  Humalog mix  28 units with breakfast and continue 30 units with  supper Continue Metformin 1000 mg twice daily  EDUCATION / INSTRUCTIONS: BG monitoring instructions: Patient is instructed to check his blood sugars to times a day, before breakfast and supper. Call [facility] if: BG persistently < 70  I reviewed the Rule of 15 for the treatment of hypoglycemia in detail with the patient. Literature supplied.   2)Diabetic complications:  Eye: Does not have known diabetic retinopathy.  Neuro/ Feet: Does have known diabetic peripheral neuropathy. Renal: Patient does have known  baseline CKD. He is on an ACEI/ARB at present.  3) Chronic kidney disease III:   -He has established care with Dr. Edrick Oh -I have ordered labs needed by nephrology we will fax those results when available   4) Dyslipidemia :  - Tg elevated but LDL at goal    Continue Atorvastatin 40 mg daily   F/U in 4 months  Addendum: a portal message has been sent to reduce AM insulin dose     Signed electronically by: Mack Guise, MD  [facility]  [facility] [address], [place] [address] Phone: [phone] FAX: [phone]   CC: Orma Render, NP [address] [place] [address] Phone: [phone]  Fax: [phone]  Return to Endocrinology clinic as below: Future Appointments  Date Time Provider [place]  11/22/2021 11:20 AM Deberah Pelton, NP [facility]  12/10/2021  2:00 PM [facility]  01/31/2022 10:00 AM [name], MD [facility]  03/31/2022  1:10 PM Early,  Coralee Pesa, NP [facility]  06/27/2022  1:00 PM Lauree Chandler, NP [facility] None

## 2021-11-12 NOTE — Patient Instructions (Addendum)
-   Decrease Humalog Mix to 28 units with  Breakfast and continue 30 units with  Supper  - Continue Metformin 1 tablet with Breakfast and Supper for now      HOW TO TREAT LOW BLOOD SUGARS (Blood sugar LESS THAN 70 MG/DL) Please follow the RULE OF 15 for the treatment of hypoglycemia treatment (when your (blood sugars are less than 70 mg/dL)   STEP 1: Take 15 grams of carbohydrates when your blood sugar is low, which includes:  3-4 GLUCOSE TABS  OR 3-4 OZ OF JUICE OR REGULAR SODA OR ONE TUBE OF GLUCOSE GEL    STEP 2: RECHECK blood sugar in 15 MINUTES STEP 3: If your blood sugar is still low at the 15 minute recheck --> then, go back to STEP 1 and treat AGAIN with another 15 grams of carbohydrates.

## 2021-11-16 LAB — PROTEIN ELECTROPHORESIS, SERUM
Albumin ELP: 3.8 g/dL (ref 3.8–4.8)
Alpha 1: 0.3 g/dL (ref 0.2–0.3)
Alpha 2: 0.8 g/dL (ref 0.5–0.9)
Beta 2: 0.4 g/dL (ref 0.2–0.5)
Beta Globulin: 0.4 g/dL (ref 0.4–0.6)
Gamma Globulin: 1 g/dL (ref 0.8–1.7)
Total Protein: 6.8 g/dL (ref 6.1–8.1)

## 2021-11-16 LAB — PARATHYROID HORMONE, INTACT (NO CA): PTH: 62 pg/mL (ref 16–77)

## 2021-11-17 ENCOUNTER — Telehealth: Payer: Self-pay | Admitting: Internal Medicine

## 2021-11-17 DIAGNOSIS — E785 Hyperlipidemia, unspecified: Secondary | ICD-10-CM | POA: Insufficient documentation

## 2021-11-17 NOTE — Telephone Encounter (Signed)
Can you please fax his lab results to Dr. Edrick Oh at [facility]  Fax [phone]   Thanks

## 2021-11-17 NOTE — Telephone Encounter (Signed)
Lab results routed to [facility] at [phone] via [place].

## 2021-11-21 NOTE — Progress Notes (Signed)
Cardiology Office Note:    Date:  11/22/2021   ID:  Seth [name], DOB 1941-10-31, MRN [id number]  PCP:  Orma Render, NP   [facility] Providers Cardiologist:  Lauree Chandler, MD      Referring MD: Lauree Chandler, NP   Follow-up for ischemic cardiomyopathy and chronic systolic CHF  History of Present Illness:    Seth [name] is a 80 y.o. male with a hx of atrial fibrillation, diabetes, hypertension, hyperlipidemia, coronary artery disease status post CABG, prostate cancer, renal insufficiency, and obesity.  He was initially seen 2/15 for evaluation of his atrial fibrillation and was placed on Xarelto.  He underwent stress testing which was abnormal and showed apical inferior lateral scar and ischemia.  His LVEF was around 32% echocardiogram 2/15 showed an LVEF of 35-40% with akinesis in the mid distal anterioseptal and apical portions of the myocardium.  He was also noted to have dilated left atria.  A cardiac catheterization 02/21/2014 showed occluded left main, moderate RCA stenosis with his entire left system filling from right to left collaterals.  He underwent CABG x3 02/24/14 (LIMA-LAD, SVG-OM, SVG-PDA.  Echocardiogram 05/28/2014 showed an LVEF of 35-40%, concentric LVH, and mild MR.  A follow-up echocardiogram 2020 showed an LVEF 50-55%, LVH, and no significant valvular abnormalities.   He followed up with [name] 11/16/2020 and denied chest pain, palpitations, dyspnea, lower extremity swelling, orthopnea, PND, dizziness, near-syncope, and syncope.   He presented to the clinic 08/03/2021 for follow-up evaluation stated he was recently admitted to [facility] for CHF exacerbation.  He presented with his wife and daughter.  He was discharged on 07/24/2019 and his weight was around 236 pounds.  His wife, retired Therapist, sports, indicated that he was having significant amount of trouble breathing, required BiPAP, and had significant lower extremity edema.  He received IV  diuresis.  On discharge his furosemide was changed to torsemide.  His weight today was 232.6 pounds on his home scale from 237.7 pounds in the office.  He had been discharged home on 2 L of oxygen.  He was no longer requiring oxygen.  His oxygen saturation was in the mid 90s in the office.  He did have a sleep study scheduled at his [facility] this coming Sunday.  I reviewed the importance of low-sodium diet and medication compliance.  I requested labs from his recent hospitalization and [facility].  We decreased his torsemide to 40 mg daily given his recent weight loss.  I planned follow-up with me as scheduled on 08/17/2021, in 3 months, and with [name] in 6 months.  I ordered an INR .  He reported that he was in the process of visiting dermatology to have a lesion removed on his back.  I  requested that they ask dermatology to send [place] a preoperative cardiac evaluation form so that our pharmacy may manage his warfarin.   He presented to the clinic 08/17/2021 for follow-up evaluation stated he felt well.  He presented with his wife retired Therapist, sports and daughter.  We did a medication reconciliation.  His blood pressure was 100/60.  He denied presyncope and syncope.  I  asked him to maintain a blood pressure log.  We will continue his lisinopril at the current dose at this time.  He had been taking 40 mill equivalents of potassium with his torsemide.  I  continued 40 mill equivalent dosing and refill medication.  We  continued daily weights, heart healthy low-sodium  diet, and maintained his physical activity.  They were planning to take a trip to [place] in the next few days.  I asked him to get up and ambulate every hour.  I  also recommended wearing lower extremity support stockings on his trip.  Follow-up was planned for 3 months and [name] in 6 months.  He presents the clinic today for follow-up evaluation states he feels well.  He did well with his recent trip to [place].  They stopped several  times during their trip up and drove straight through on the way back.  He did note slight lower extremity edema which resolved in about a day.  His weight has remained stable.  His blood pressures been well controlled in 130s over 70s at home.  His blood pressure today in the clinic was 162/72.  He took his blood pressure medication 20 minutes before his visit today.  We reviewed signs and symptoms for heart failure.  He is working with neurology and endocrinology.  He reported a fall a couple weeks ago where he had an abrasion on his head.  His wife reports that she monitored him for neuro changes.  I reviewed precautions for being evaluated in ED especially for head injury.  I have also asked him to start to carry a cane regularly as a precaution due to his unsteady gait.  He has a new Dexicom, diabetes monitor.  We reviewed his most recent lab work.  It remained stable.  We will keep his follow-up with [name] in February.   Today he denies chest pain, shortness of breath, lower extremity edema, fatigue, palpitations, melena, hematuria, hemoptysis, diaphoresis, weakness, presyncope, syncope, orthopnea, and PND.  Past Medical History:  Diagnosis Date   Acidosis 07/19/2021   Acute on chronic diastolic (congestive) heart failure ([place]) 09/30/2014   Atrial fibrillation (HCC)    a. coumadin;  b. Amiodarone   CAD (coronary artery disease)    a.  Lexiscan Myoview (02/14/14):  Apical, inferolateral scar; ? Mild ischemia; EF 32%.;  b.  LHC (02/21/14):  dLM 100%, LAD 100%, CFX 100%, dRCA 50%. => CABG (L-LAD, S-OM, S-PDA)   Chronic systolic heart failure (HCC)    CKD (chronic kidney disease)    proteinuria   Diabetes mellitus with peripheral autonomic neuropathy ([place]) 09/30/2014   Hemorrhage of gastrointestinal tract, unspecified    Hemorrhage of rectum and anus    History of urinary stone 10/08/2007   HLD (hyperlipidemia)    HTN (hypertension)    Hypertension, benign 06/10/2010   Impotence of organic  origin    Ischemic cardiomyopathy    a. Echo (02/17/14):  EF 35-40%, anteroseptal and apical AK, Gr 2 DD, MAC, mod LAE.   Nephrolithiasis    Obesity, unspecified 11/11/2009   Peripheral vascular disease, unspecified ([place])    Prostate cancer ([place])    Proteinuria 09/30/2009   Thrombocytopenia, unspecified ([place])    Tobacco use disorder    Tobacco user 11/11/2009   Type 2 diabetes mellitus with diabetic peripheral angiopathy without gangrene ([place]) 07/19/2021   Type II or unspecified type diabetes mellitus with renal manifestations, uncontrolled(250.42)    Urolithiasis 12/26/1997    Past Surgical History:  Procedure Laterality Date   BACK SURGERY     CATARACT EXTRACTION  2010   CLIPPING OF ATRIAL APPENDAGE Left 02/24/2014   Procedure: CLIPPING OF ATRIAL APPENDAGE;  Surgeon: Gaye Pollack, MD;  Location: [facility];  Service: Open Heart Surgery;  Laterality: Left;   COLONOSCOPY  2005   Normal   CORONARY ARTERY BYPASS GRAFT N/A 02/24/2014   Procedure: Coronary artery bypass graft times three using left internal mammary artery and left leg greater saphenous vein harvested endoscopically.;  Surgeon: Gaye Pollack, MD;  Location: [facility] OR;  Service: Open Heart Surgery;  Laterality: N/A;   INTRAOPERATIVE TRANSESOPHAGEAL ECHOCARDIOGRAM N/A 02/24/2014   Procedure: INTRAOPERATIVE TRANSESOPHAGEAL ECHOCARDIOGRAM;  Surgeon: Gaye Pollack, MD;  Location: [facility] OR;  Service: Open Heart Surgery;  Laterality: N/A;   LEFT HEART CATHETERIZATION WITH CORONARY ANGIOGRAM N/A 02/21/2014   Procedure: LEFT HEART CATHETERIZATION WITH CORONARY ANGIOGRAM;  Surgeon: Peter M Martinique, MD;  Location: [facility] CATH LAB;  Service: Cardiovascular;  Laterality: N/A;    Current Medications: Current Meds  Medication Sig   aspirin EC 81 MG tablet Take 1 tablet (81 mg total) by mouth daily.   Calcium Carbonate-Vitamin D 600-200 MG-UNIT TABS Take 1 tablet by mouth daily. For bones   Continuous Blood Gluc Receiver (DEXCOM G6 RECEIVER) DEVI 1 Device by  Does not apply route as directed.   Continuous Blood Gluc Sensor (DEXCOM G6 SENSOR) MISC 1 Device by Does not apply route as directed.   Continuous Blood Gluc Transmit (DEXCOM G6 TRANSMITTER) MISC 1 Device by Does not apply route as directed.   Flaxseed Oil (LINSEED OIL) OIL  Daily, 0 Refill(s)   Flaxseed, Linseed, (FLAXSEED OIL PO) Take 1 tablet by mouth daily.   Insulin Lispro Prot & Lispro (HUMALOG MIX 75/25 KWIKPEN) (75-25) 100 UNIT/ML Kwikpen Inject 36 Units into the skin 2 (two) times daily with a meal. (Patient taking differently: Inject into the skin. Per patient: Taking 28 units qAM and 30 units qHs)   metFORMIN (GLUCOPHAGE) 1000 MG tablet TAKE ONE TABLET BY MOUTH TWICE DAILY WITH MEAL   Multiple Vitamin (MULTIVITAMINS PO) Take 1 tablet by mouth daily.   warfarin (COUMADIN) 5 MG tablet Take 1 and 1/2 tablets daily by mouth or as directed by Anticoagulation Clinic.   [DISCONTINUED] atorvastatin (LIPITOR) 40 MG tablet Take 1 tablet (40 mg total) by mouth daily.   [DISCONTINUED] carvedilol (COREG) 12.5 MG tablet TAKE 1 TABLET BY MOUTH TWICE DAILY WITH A MEAL   [DISCONTINUED] lisinopril (ZESTRIL) 40 MG tablet Take 1 tablet (40 mg total) by mouth 2 (two) times daily.   [DISCONTINUED] potassium chloride SA (KLOR-CON) 20 MEQ tablet Take 2 tablets (40 mEq total) by mouth daily.   [DISCONTINUED] torsemide (DEMADEX) 20 MG tablet Take 20 mg by mouth daily.     Allergies:   Patient has no known allergies.   Social History   Socioeconomic History   Marital status: Married    Spouse name: Not on file   Number of children: 3   Years of education: Not on file   Highest education level: Not on file  Occupational History   Occupation: Manufacturing engineer buildings  Tobacco Use   Smoking status: Never   Smokeless tobacco: Former    Types: Chew    Quit date: 02/23/2014  Vaping Use   Vaping Use: Never used  Substance and Sexual Activity   Alcohol use: No   Drug use: No   Sexual activity: Never   Other Topics Concern   Not on file  Social History Narrative   Not on file   Social Determinants of Health   Financial Resource Strain: Not on file  Food Insecurity: Not on file  Transportation Needs: Not on file  Physical Activity: Not on file  Stress: Not on file  Social  Connections: Not on file     Family History: The patient's family history includes Cancer (age of onset: 62) in his mother; Heart attack (age of onset: 57) in his father; Heart disease in his father; Lung cancer in his brother. There is no history of Colon cancer or Rectal cancer.  ROS:   Please see the history of present illness.     All other systems reviewed and are negative.   Risk Assessment/Calculations:    CHA2DS2-VASc Score = 6   This indicates a 9.7% annual risk of stroke. The patient's score is based upon: CHF History: 1 HTN History: 1 Diabetes History: 1 Stroke History: 0 Vascular Disease History: 1 Age Score: 2 Gender Score: 0         Physical Exam:    VS:  BP (!) 162/72   Pulse 60   Ht _0  (1.778 m)   Wt 235 lb (106.6 kg)   BMI 33.72 kg/m     Wt Readings from Last 3 Encounters:  11/22/21 235 lb (106.6 kg)  11/12/21 235 lb (106.6 kg)  10/05/21 234 lb 9.6 oz (106.4 kg)     GEN:  Well nourished, well developed in no acute distress HEENT: Normal NECK: No JVD; No carotid bruits LYMPHATICS: No lymphadenopathy CARDIAC: RRR, no murmurs, rubs, gallops RESPIRATORY:  Clear to auscultation without rales, wheezing or rhonchi  ABDOMEN: Soft, non-tender, non-distended MUSCULOSKELETAL:  No edema; No deformity  SKIN: Warm and dry NEUROLOGIC:  Alert and oriented x 3 PSYCHIATRIC:  Normal affect    EKGs/Labs/Other Studies Reviewed:    The following studies were reviewed today:  Echocardiogram 10/02/2019 IMPRESSIONS     1. Left ventricular ejection fraction, by visual estimation, is 50 to  55%. The left ventricle has normal function. There is moderately increased  left  ventricular hypertrophy.   2. Left ventricular diastolic Doppler parameters are consistent with  impaired relaxation pattern of LV diastolic filling.   3. Global right ventricle has normal systolic function.The right  ventricular size is normal. No increase in right ventricular wall  thickness.   4. Left atrial size was normal.   5. Right atrial size was mildly dilated.   6. The mitral valve is normal in structure. No evidence of mitral valve  regurgitation.   7. The tricuspid valve is normal in structure. Tricuspid valve  regurgitation was not visualized by color flow Doppler.   8. The aortic valve is tricuspid Aortic valve regurgitation was not  visualized by color flow Doppler. Mild to moderate aortic valve  sclerosis/calcification without any evidence of aortic stenosis.   9. The pulmonic valve was grossly normal. Pulmonic valve regurgitation is  mild by color flow Doppler.  10. The atrial septum is grossly normal.  EKG: None today.  Recent Labs: 08/13/2021: TSH 2.50 11/12/2021: ALT 14; BUN 33; Creatinine, Ser 1.35; Hemoglobin 11.7; Platelets 140.0; Potassium 4.1; Sodium 142  Recent Lipid Panel    Component Value Date/Time   CHOL 101 11/12/2021 1438   CHOL 101 06/08/2016 0843   TRIG 208.0 (H) 11/12/2021 1438   HDL 37.10 (L) 11/12/2021 1438   HDL 37 (L) 06/08/2016 0843   CHOLHDL 3 11/12/2021 1438   VLDL 41.6 (H) 11/12/2021 1438   LDLCALC 38 04/13/2021 0947   LDLDIRECT 41.0 11/12/2021 1438    ASSESSMENT & PLAN    Ischemic cardiomyopathy-continues to be euvolemic.  Weight today 235 pounds.  No increased DOE or activity intolerance.  Did well with recent trip to [place] and  increased walking.  Echocardiogram 10/20 showed LVEF 50-55% and no significant valvular abnormalities.    Continue carvedilol, amlodipine, lisinopril Heart healthy low-sodium diet Increase physical activity as tolerated  Coronary artery disease-no chest pain today.   Status post CABG x3  02/24/14. Continue aspirin, atorvastatin, amlodipine, carvedilol Heart healthy low-sodium diet-salty 6 given Increase physical activity as tolerated   Hyperlipidemia-04/13/2021: Cholesterol 93; HDL 38; LDL Cholesterol (Calc) 38; Triglycerides 86 Continue atorvastatin, aspirin, flaxseed Heart healthy low-sodium high-fiber diet Increase physical activity as tolerated Repeat fasting lipids and LFTs 4/23  Atrial fibrillation-heart rate today 60 bpm.  Continues to report compliance with warfarin.  Denies bleeding issues. Continue carvedilol, warfarin Heart healthy low-sodium diet-salty 6 given Increase physical activity as tolerated   Essential hypertension-BP today 162/72.  Took blood pressure medication about 20 minutes prior to visit.  Well-controlled at home. Continue amlodipine, carvedilol, lisinopril Heart healthy low-sodium diet-salty 6 given Increase physical activity as tolerated   Disposition: Follow-up with  Dr.  Angelena Form as scheduled.       Medication Adjustments/Labs and Tests Ordered: Current medicines are reviewed at length with the patient today.  Concerns regarding medicines are outlined above.  No orders of the defined types were placed in this encounter.  Meds ordered this encounter  Medications   atorvastatin (LIPITOR) 40 MG tablet    Sig: Take 1 tablet (40 mg total) by mouth daily.    Dispense:  90 tablet    Refill:  1   carvedilol (COREG) 12.5 MG tablet    Sig: Take 1 tablet (12.5 mg total) by mouth 2 (two) times daily with a meal.    Dispense:  180 tablet    Refill:  1   lisinopril (ZESTRIL) 40 MG tablet    Sig: Take 1 tablet (40 mg total) by mouth 2 (two) times daily.    Dispense:  180 tablet    Refill:  1   potassium chloride SA (KLOR-CON) 20 MEQ tablet    Sig: Take 2 tablets (40 mEq total) by mouth daily.    Dispense:  180 tablet    Refill:  1   torsemide (DEMADEX) 20 MG tablet    Sig: Take 1 tablet (20 mg total) by mouth daily.    Dispense:  90  tablet    Refill:  1     Patient Instructions  Medication Instructions:  Your Physician recommend you continue on your current medication as directed.    *If you need a refill on your cardiac medications before your next appointment, please call your pharmacy*   Lab Work: None ordered today   Testing/Procedures: None ordered today    Follow-Up: At [facility], you and your health needs are our priority.  As part of our continuing mission to provide you with exceptional heart care, we have created designated Provider Care Teams.  These Care Teams include your primary Cardiologist (physician) and Advanced Practice Providers (APPs -  Physician Assistants and Nurse Practitioners) who all work together to provide you with the care you need, when you need it.  We recommend signing up for the patient portal called "MyChart".  Sign up information is provided on this After Visit Summary.  MyChart is used to connect with patients for Virtual Visits (Telemedicine).  Patients are able to view lab/test results, encounter notes, upcoming appointments, etc.  Non-urgent messages can be sent to your provider as well.   To learn more about what you can do with MyChart, go to [email or web address].    Your  next appointment:   Keep Scheduled appointment on 01/31/22 @ 10am   The format for your next appointment:   In Person  Provider:   Lauree Chandler, MD     Other Instructions Please call the office if you gain 3 pounds overnight or 5 pounds in a week!  '   Signed, Deberah Pelton, NP  11/22/2021 11:58 AM      Notice: This dictation was prepared with Dragon dictation along with smaller phrase technology. Any transcriptional errors that result from this process are unintentional and may not be corrected upon review.  I spent 14 minutes examining this patient, reviewing medications, and using patient centered shared decision making involving her cardiac care.  Prior to her visit I  spent greater than 20 minutes reviewing her past medical history,  medications, and prior cardiac tests.

## 2021-11-22 ENCOUNTER — Encounter (HOSPITAL_BASED_OUTPATIENT_CLINIC_OR_DEPARTMENT_OTHER): Payer: Self-pay | Admitting: General Practice

## 2021-11-22 ENCOUNTER — Other Ambulatory Visit: Payer: Self-pay

## 2021-11-22 ENCOUNTER — Ambulatory Visit (INDEPENDENT_AMBULATORY_CARE_PROVIDER_SITE_OTHER): Payer: Medicare Other | Admitting: General Practice

## 2021-11-22 ENCOUNTER — Other Ambulatory Visit (HOSPITAL_BASED_OUTPATIENT_CLINIC_OR_DEPARTMENT_OTHER): Payer: Self-pay | Admitting: General Practice

## 2021-11-22 VITALS — BP 162/72 | HR 60 | Ht 70.0 in | Wt 235.0 lb

## 2021-11-22 DIAGNOSIS — I4891 Unspecified atrial fibrillation: Secondary | ICD-10-CM

## 2021-11-22 DIAGNOSIS — Z794 Long term (current) use of insulin: Secondary | ICD-10-CM | POA: Diagnosis not present

## 2021-11-22 DIAGNOSIS — I10 Essential (primary) hypertension: Secondary | ICD-10-CM

## 2021-11-22 DIAGNOSIS — I2510 Atherosclerotic heart disease of native coronary artery without angina pectoris: Secondary | ICD-10-CM

## 2021-11-22 DIAGNOSIS — I5022 Chronic systolic (congestive) heart failure: Secondary | ICD-10-CM

## 2021-11-22 DIAGNOSIS — E7800 Pure hypercholesterolemia, unspecified: Secondary | ICD-10-CM | POA: Diagnosis not present

## 2021-11-22 DIAGNOSIS — E782 Mixed hyperlipidemia: Secondary | ICD-10-CM

## 2021-11-22 DIAGNOSIS — E1165 Type 2 diabetes mellitus with hyperglycemia: Secondary | ICD-10-CM

## 2021-11-22 MED ORDER — CARVEDILOL 12.5 MG PO TABS: 12.5000 mg | ORAL_TABLET | Freq: Two times a day (BID) | ORAL | 1 refills | Status: DC

## 2021-11-22 MED ORDER — ATORVASTATIN CALCIUM 40 MG PO TABS: 40.0000 mg | ORAL_TABLET | Freq: Every day | ORAL | 1 refills | Status: DC

## 2021-11-22 MED ORDER — LISINOPRIL 40 MG PO TABS: 40.0000 mg | ORAL_TABLET | Freq: Two times a day (BID) | ORAL | 1 refills | Status: DC

## 2021-11-22 MED ORDER — TORSEMIDE 20 MG PO TABS: 20.0000 mg | ORAL_TABLET | Freq: Every day | ORAL | 1 refills | Status: DC

## 2021-11-22 MED ORDER — POTASSIUM CHLORIDE CRYS ER 20 MEQ PO TBCR: 40.0000 meq | EXTENDED_RELEASE_TABLET | Freq: Every day | ORAL | 1 refills | Status: DC

## 2021-11-22 NOTE — Patient Instructions (Signed)
Medication Instructions:  Your Physician recommend you continue on your current medication as directed.    *If you need a refill on your cardiac medications before your next appointment, please call your pharmacy*   Lab Work: None ordered today   Testing/Procedures: None ordered today    Follow-Up: At [facility], you and your health needs are our priority.  As part of our continuing mission to provide you with exceptional heart care, we have created designated Provider Care Teams.  These Care Teams include your primary Cardiologist (physician) and Advanced Practice Providers (APPs -  Physician Assistants and Nurse Practitioners) who all work together to provide you with the care you need, when you need it.  We recommend signing up for the patient portal called "MyChart".  Sign up information is provided on this After Visit Summary.  MyChart is used to connect with patients for Virtual Visits (Telemedicine).  Patients are able to view lab/test results, encounter notes, upcoming appointments, etc.  Non-urgent messages can be sent to your provider as well.   To learn more about what you can do with MyChart, go to [email or web address].    Your next appointment:   Keep Scheduled appointment on 01/31/22 @ 10am   The format for your next appointment:   In Person  Provider:   Lauree Chandler, MD     Other Instructions Please call the office if you gain 3 pounds overnight or 5 pounds in a week!  '

## 2021-11-22 NOTE — Telephone Encounter (Signed)
Pt seeing Coletta Memos, NP in office today. He is requesting 90 day supply of Coumadin 5 mg sent to [facility] on [address] in [facility]. Please review for refill. Thank you!

## 2021-11-23 MED ORDER — WARFARIN SODIUM 5 MG PO TABS: ORAL_TABLET | ORAL | 0 refills | Status: DC

## 2021-12-10 ENCOUNTER — Other Ambulatory Visit: Payer: Self-pay

## 2021-12-10 ENCOUNTER — Ambulatory Visit (INDEPENDENT_AMBULATORY_CARE_PROVIDER_SITE_OTHER): Payer: Medicare Other

## 2021-12-10 DIAGNOSIS — Z5181 Encounter for therapeutic drug level monitoring: Secondary | ICD-10-CM

## 2021-12-10 DIAGNOSIS — I4891 Unspecified atrial fibrillation: Secondary | ICD-10-CM

## 2021-12-10 LAB — POCT INR: INR: 2.5 (ref 2.0–3.0)

## 2021-12-10 NOTE — Patient Instructions (Signed)
Description   Continue on same dosage of Warfarin 1.5 tablets daily. Recheck INR in 6 weeks. Coumadin Clinic [phone].

## 2021-12-27 DIAGNOSIS — G4733 Obstructive sleep apnea (adult) (pediatric): Secondary | ICD-10-CM | POA: Diagnosis not present

## 2021-12-28 DIAGNOSIS — E291 Testicular hypofunction: Secondary | ICD-10-CM | POA: Diagnosis not present

## 2021-12-28 DIAGNOSIS — Z8546 Personal history of malignant neoplasm of prostate: Secondary | ICD-10-CM | POA: Diagnosis not present

## 2022-01-03 DIAGNOSIS — E291 Testicular hypofunction: Secondary | ICD-10-CM | POA: Diagnosis not present

## 2022-01-03 DIAGNOSIS — C61 Malignant neoplasm of prostate: Secondary | ICD-10-CM | POA: Diagnosis not present

## 2022-01-06 ENCOUNTER — Other Ambulatory Visit (HOSPITAL_BASED_OUTPATIENT_CLINIC_OR_DEPARTMENT_OTHER): Payer: Self-pay | Admitting: General Practice

## 2022-01-21 ENCOUNTER — Ambulatory Visit (INDEPENDENT_AMBULATORY_CARE_PROVIDER_SITE_OTHER): Payer: Medicare Other

## 2022-01-21 ENCOUNTER — Other Ambulatory Visit: Payer: Self-pay

## 2022-01-21 DIAGNOSIS — I4891 Unspecified atrial fibrillation: Secondary | ICD-10-CM

## 2022-01-21 DIAGNOSIS — Z5181 Encounter for therapeutic drug level monitoring: Secondary | ICD-10-CM

## 2022-01-21 LAB — POCT INR: INR: 2.4 (ref 2.0–3.0)

## 2022-01-21 NOTE — Patient Instructions (Signed)
Description   Continue on same dosage of Warfarin 1.5 tablets daily. Recheck INR in 6 weeks. Coumadin Clinic [phone].

## 2022-01-30 NOTE — Progress Notes (Signed)
Chief Complaint  Patient presents with   Follow-up    CAD   History of Present Illness: 81 yo male with a history of paroxysmal atrial fibrillation, DM, HTN, HLD, CAD s/p CABG, chronic diastolic CHF, prostate CA, renal insufficiency and obesity here today for cardiac follow up. He was seen as a new patient 02/12/14 for evaluation of atrial fibrillation and was placed on Xarelto. Stress test was abnormal with apical and inferolateral scar and ischemia, LVEF of 32%. Echo 02/17/14 with LVEF=35-40% with akinesis of the mid-distalanteroseptal and apical myocardium, MAC, dilated LA. Cardiac cath 02/21/14 with occluded left main, moderate RCA stenosis with the entire left system filling from right to left collaterals. 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 Echo 05/28/14 with LVEF=35-40%, concentric LVH, mild MR. Echo October 2020 with LVEF=50-55%, LVH, no significant valve disease. He was admitted ot [facility] in August 2022 with CHF. He was diuresed and lasix changed to Torsemide. Outside echo Sept 2022 with LVEF=63%.   He is here today for follow up. The patient denies any chest pain, dyspnea, palpitations, lower extremity edema, orthopnea, PND, dizziness, near syncope or syncope. His LE edema is resolved. He is feeling great. His not exercising.    Primary Care Physician: Orma Render, NP  Past Medical History:  Diagnosis Date   Acidosis 07/19/2021   Acute on chronic diastolic (congestive) heart failure ([name]) 09/30/2014   Atrial fibrillation (HCC)    a. coumadin;  b. Amiodarone   CAD (coronary artery disease)    a.  Lexiscan Myoview (02/14/14):  Apical, inferolateral scar; ? Mild ischemia; EF 32%.;  b.  LHC (02/21/14):  dLM 100%, LAD 100%, CFX 100%, dRCA 50%. => CABG (L-LAD, S-OM, S-PDA)   Chronic systolic heart failure (HCC)    CKD (chronic kidney disease)    proteinuria   Diabetes mellitus with peripheral autonomic neuropathy ([place]) 09/30/2014    Hemorrhage of gastrointestinal tract, unspecified    Hemorrhage of rectum and anus    History of urinary stone 10/08/2007   HLD (hyperlipidemia)    HTN (hypertension)    Hypertension, benign 06/10/2010   Impotence of organic origin    Ischemic cardiomyopathy    a. Echo (02/17/14):  EF 35-40%, anteroseptal and apical AK, Gr 2 DD, MAC, mod LAE.   Nephrolithiasis    Obesity, unspecified 11/11/2009   Peripheral vascular disease, unspecified ([place])    Prostate cancer ([place])    Proteinuria 09/30/2009   Thrombocytopenia, unspecified ([place])    Tobacco use disorder    Tobacco user 11/11/2009   Type 2 diabetes mellitus with diabetic peripheral angiopathy without gangrene ([place]) 07/19/2021   Type II or unspecified type diabetes mellitus with renal manifestations, uncontrolled(250.42)    Urolithiasis 12/26/1997    Past Surgical History:  Procedure Laterality Date   BACK SURGERY     CATARACT EXTRACTION  2010   CLIPPING OF ATRIAL APPENDAGE Left 02/24/2014   Procedure: CLIPPING OF ATRIAL APPENDAGE;  Surgeon: Gaye Pollack, MD;  Location: [place];  Service: Open Heart Surgery;  Laterality: Left;   COLONOSCOPY  2005   Normal   CORONARY ARTERY BYPASS GRAFT N/A 02/24/2014   Procedure: Coronary artery bypass graft times three using left internal mammary artery and left leg greater saphenous vein harvested endoscopically.;  Surgeon: Gaye Pollack, MD;  Location: [facility] OR;  Service: Open Heart Surgery;  Laterality: N/A;   INTRAOPERATIVE TRANSESOPHAGEAL ECHOCARDIOGRAM N/A 02/24/2014   Procedure: INTRAOPERATIVE  TRANSESOPHAGEAL ECHOCARDIOGRAM;  Surgeon: Gaye Pollack, MD;  [facility];  Service: Open Heart Surgery;  Laterality: N/A;   LEFT HEART CATHETERIZATION WITH CORONARY ANGIOGRAM N/A 02/21/2014   Procedure: LEFT HEART CATHETERIZATION WITH CORONARY ANGIOGRAM;  Surgeon: Peter M Martinique, MD;  Location: [facility] CATH LAB;  Service: Cardiovascular;  Laterality: N/A;    Current Outpatient Medications  Medication Sig Dispense  Refill   aspirin EC 81 MG tablet Take 1 tablet (81 mg total) by mouth daily.     bumetanide (BUMEX) 1 MG tablet Take 1 mg by mouth daily.     Calcium Carbonate-Vitamin D 600-200 MG-UNIT TABS Take 1 tablet by mouth daily. For bones     Continuous Blood Gluc Receiver (DEXCOM G6 RECEIVER) DEVI 1 Device by Does not apply route as directed. 1 each 0   Continuous Blood Gluc Sensor (DEXCOM G6 SENSOR) MISC 1 Device by Does not apply route as directed. 9 each 3   Continuous Blood Gluc Transmit (DEXCOM G6 TRANSMITTER) MISC 1 Device by Does not apply route as directed. 1 each 3   Flaxseed Oil (LINSEED OIL) OIL  Daily, 0 Refill(s)     Flaxseed, Linseed, (FLAXSEED OIL PO) Take 1 tablet by mouth daily.     Insulin Lispro Prot & Lispro (HUMALOG MIX 75/25 KWIKPEN) (75-25) 100 UNIT/ML Kwikpen Inject 36 Units into the skin 2 (two) times daily with a meal. (Patient taking differently: Inject into the skin. Per patient: Taking 28 units qAM and 30 units qHs) 45 mL 3   metFORMIN (GLUCOPHAGE) 1000 MG tablet TAKE 1 TABLET BY MOUTH TWICE DAILY WITH MEALS 180 tablet 0   Multiple Vitamin (MULTIVITAMINS PO) Take 1 tablet by mouth daily.     warfarin (COUMADIN) 5 MG tablet Take 1 and 1/2 tablets daily by mouth or as directed by Anticoagulation Clinic. 135 tablet 0   atorvastatin (LIPITOR) 40 MG tablet Take 1 tablet (40 mg total) by mouth daily. 90 tablet 3   carvedilol (COREG) 12.5 MG tablet Take 1 tablet (12.5 mg total) by mouth 2 (two) times daily with a meal. 180 tablet 3   lisinopril (ZESTRIL) 40 MG tablet Take 1 tablet (40 mg total) by mouth 2 (two) times daily. 180 tablet 3   potassium chloride SA (KLOR-CON M) 20 MEQ tablet Take 2 tablets (40 mEq total) by mouth daily. 180 tablet 3   torsemide (DEMADEX) 20 MG tablet Take 1 tablet (20 mg total) by mouth daily. 90 tablet 3   No current facility-administered medications for this visit.    No Known Allergies  Social History   Socioeconomic History   Marital status:  Married    Spouse name: Not on file   Number of children: 3   Years of education: Not on file   Highest education level: Not on file  Occupational History   Occupation: Manufacturing engineer buildings  Tobacco Use   Smoking status: Never   Smokeless tobacco: Former    Types: Chew    Quit date: 02/23/2014  Vaping Use   Vaping Use: Never used  Substance and Sexual Activity   Alcohol use: No   Drug use: No   Sexual activity: Never  Other Topics Concern   Not on file  Social History Narrative   Not on file   Social Determinants of Health   Financial Resource Strain: Not on file  Food Insecurity: Not on file  Transportation Needs: Not on file  Physical Activity: Not on file  Stress: Not on file  Social Connections: Not on file  Intimate Partner Violence: Not on file    Family History  Problem Relation Age of Onset   Cancer Mother 3       leukemia   Heart disease Father    Heart attack Father 14   Lung cancer Brother    Colon cancer Neg Hx    Rectal cancer Neg Hx     Review of Systems:  As stated in the HPI and otherwise negative.   BP 136/60    Pulse 69    Ht _0  (1.778 m)    Wt 237 lb 12.8 oz (107.9 kg)    SpO2 98%    BMI 34.12 kg/m   Physical Examination:  General: Well developed, well nourished, NAD  HEENT: OP clear, mucus membranes moist  SKIN: warm, dry. No rashes. Neuro: No focal deficits  Musculoskeletal: Muscle strength 5/5 all ext  Psychiatric: Mood and affect normal  Neck: No JVD, no carotid bruits, no thyromegaly, no lymphadenopathy.  Lungs:Clear bilaterally, no wheezes, rhonci, crackles Cardiovascular: Regular rate and rhythm. No murmurs, gallops or rubs. Abdomen:Soft. Bowel sounds present. Non-tender.  Extremities: No lower extremity edema. Pulses are 2 + in the bilateral DP/PT.  Echo October 2020:   1. Left ventricular ejection fraction, by visual estimation, is 50 to  55%. The left ventricle has normal function. There is moderately increased  left  ventricular hypertrophy.   2. Left ventricular diastolic Doppler parameters are consistent with  impaired relaxation pattern of LV diastolic filling.   3. Global right ventricle has normal systolic function.The right  ventricular size is normal. No increase in right ventricular wall  thickness.   4. Left atrial size was normal.   5. Right atrial size was mildly dilated.   6. The mitral valve is normal in structure. No evidence of mitral valve  regurgitation.   7. The tricuspid valve is normal in structure. Tricuspid valve  regurgitation was not visualized by color flow Doppler.   8. The aortic valve is tricuspid Aortic valve regurgitation was not  visualized by color flow Doppler. Mild to moderate aortic valve  sclerosis/calcification without any evidence of aortic stenosis.   9. The pulmonic valve was grossly normal. Pulmonic valve regurgitation is  mild by color flow Doppler.  10. The atrial septum is grossly normal.   EKG:  EKG is not ordered today. The ekg ordered today demonstrates    Recent Labs: 08/13/2021: TSH 2.50 11/12/2021: ALT 14; BUN 33; Creatinine, Ser 1.35; Hemoglobin 11.7; Platelets 140.0; Potassium 4.1; Sodium 142   Lipid Panel    Component Value Date/Time   CHOL 101 11/12/2021 1438   CHOL 101 06/08/2016 0843   TRIG 208.0 (H) 11/12/2021 1438   HDL 37.10 (L) 11/12/2021 1438   HDL 37 (L) 06/08/2016 0843   CHOLHDL 3 11/12/2021 1438   VLDL 41.6 (H) 11/12/2021 1438   LDLCALC 38 04/13/2021 0947   LDLDIRECT 41.0 11/12/2021 1438     Wt Readings from Last 3 Encounters:  01/31/22 237 lb 12.8 oz (107.9 kg)  11/22/21 235 lb (106.6 kg)  11/12/21 235 lb (106.6 kg)     Other studies Reviewed: Additional studies/ records that were reviewed today include: . Review of the above records demonstrates:    Assessment and Plan:   1. CAD s/p CABG without angina: No chest pain. Continue ASA, statin and beta blocker.       2. Atrial fibrillation, paroxysmal: He is in  sinus today. Will continue beta blocker  and coumadin.    3. Ischemic cardiomyopathy/Chronic diastolic CHF: VIFX=25-27% by echo September 2022. Continue beta blocker, ace-inh and Torsemide 20 mg once daily.   4. HLD: LDL 41 in November 2022. Continue statin  5. HTN: BP controlled. Continue current therapy   Current medicines are reviewed at length with the patient today.  The patient does not have concerns regarding medicines.  The following changes have been made:  no change  Labs/ tests ordered today include:   No orders of the defined types were placed in this encounter.   Disposition:   FU with Coletta Memos, NP in 4-6 months.   Signed, Lauree Chandler, MD 01/31/2022 10:25 AM    [facility] [address], [place], [place]  [address] Phone: [phone]; Fax: [phone]

## 2022-01-31 ENCOUNTER — Other Ambulatory Visit: Payer: Self-pay

## 2022-01-31 ENCOUNTER — Ambulatory Visit (INDEPENDENT_AMBULATORY_CARE_PROVIDER_SITE_OTHER): Payer: Medicare Other | Admitting: Cardiovascular Disease

## 2022-01-31 ENCOUNTER — Encounter: Payer: Self-pay | Admitting: Cardiovascular Disease

## 2022-01-31 VITALS — BP 136/60 | HR 69 | Ht 70.0 in | Wt 237.8 lb

## 2022-01-31 DIAGNOSIS — E782 Mixed hyperlipidemia: Secondary | ICD-10-CM | POA: Diagnosis not present

## 2022-01-31 DIAGNOSIS — I255 Ischemic cardiomyopathy: Secondary | ICD-10-CM

## 2022-01-31 DIAGNOSIS — Z794 Long term (current) use of insulin: Secondary | ICD-10-CM

## 2022-01-31 DIAGNOSIS — I480 Paroxysmal atrial fibrillation: Secondary | ICD-10-CM | POA: Diagnosis not present

## 2022-01-31 DIAGNOSIS — E1165 Type 2 diabetes mellitus with hyperglycemia: Secondary | ICD-10-CM

## 2022-01-31 DIAGNOSIS — I5022 Chronic systolic (congestive) heart failure: Secondary | ICD-10-CM | POA: Diagnosis not present

## 2022-01-31 DIAGNOSIS — I10 Essential (primary) hypertension: Secondary | ICD-10-CM

## 2022-01-31 DIAGNOSIS — I2510 Atherosclerotic heart disease of native coronary artery without angina pectoris: Secondary | ICD-10-CM | POA: Diagnosis not present

## 2022-01-31 MED ORDER — TORSEMIDE 20 MG PO TABS: 20.0000 mg | ORAL_TABLET | Freq: Every day | ORAL | 3 refills | Status: DC

## 2022-01-31 MED ORDER — CARVEDILOL 12.5 MG PO TABS: 12.5000 mg | ORAL_TABLET | Freq: Two times a day (BID) | ORAL | 3 refills | Status: DC

## 2022-01-31 MED ORDER — ATORVASTATIN CALCIUM 40 MG PO TABS: 40.0000 mg | ORAL_TABLET | Freq: Every day | ORAL | 3 refills | Status: DC

## 2022-01-31 MED ORDER — POTASSIUM CHLORIDE CRYS ER 20 MEQ PO TBCR: 40.0000 meq | EXTENDED_RELEASE_TABLET | Freq: Every day | ORAL | 3 refills | Status: DC

## 2022-01-31 MED ORDER — LISINOPRIL 40 MG PO TABS: 40.0000 mg | ORAL_TABLET | Freq: Two times a day (BID) | ORAL | 3 refills | Status: DC

## 2022-01-31 NOTE — Patient Instructions (Signed)
Medication Instructions:  No changes *If you need a refill on your cardiac medications before your next appointment, please call your pharmacy*   Lab Work: none If you have labs (blood work) drawn today and your tests are completely normal, you will receive your results only by: [place] (if you have MyChart) OR A paper copy in the mail If you have any lab test that is abnormal or we need to change your treatment, we will call you to review the results.   Testing/Procedures: none   Follow-Up: As planned with Coletta Memos, APP   Addendum: 01/31/22 10:20 am  Called and spoke w patient's wife re: medications.  Listed are Bumex and torsemide.  She reports she thinks the only diuretic he is taking is torsemide, but will check when they get home and call back with clarification.  Refill of torsemide sent.

## 2022-03-09 ENCOUNTER — Ambulatory Visit (INDEPENDENT_AMBULATORY_CARE_PROVIDER_SITE_OTHER): Payer: Medicare Other

## 2022-03-09 ENCOUNTER — Other Ambulatory Visit: Payer: Self-pay

## 2022-03-09 DIAGNOSIS — I4891 Unspecified atrial fibrillation: Secondary | ICD-10-CM | POA: Diagnosis not present

## 2022-03-09 DIAGNOSIS — Z5181 Encounter for therapeutic drug level monitoring: Secondary | ICD-10-CM | POA: Diagnosis not present

## 2022-03-09 LAB — POCT INR: INR: 3.3 — AB (ref 2.0–3.0)

## 2022-03-09 NOTE — Patient Instructions (Signed)
Description   ?Only take 1 tablet today and then Continue on same dosage of Warfarin 1.5 tablets daily.  ?Recheck INR in 5 weeks.  ?Coumadin Clinic [phone].  ?  ?   ?

## 2022-03-16 ENCOUNTER — Other Ambulatory Visit: Payer: Self-pay

## 2022-03-16 ENCOUNTER — Encounter: Payer: Self-pay | Admitting: Internal Medicine

## 2022-03-16 ENCOUNTER — Ambulatory Visit (INDEPENDENT_AMBULATORY_CARE_PROVIDER_SITE_OTHER): Payer: Medicare Other | Admitting: Internal Medicine

## 2022-03-16 VITALS — BP 118/72 | HR 64 | Ht 70.0 in | Wt 230.0 lb

## 2022-03-16 DIAGNOSIS — R739 Hyperglycemia, unspecified: Secondary | ICD-10-CM

## 2022-03-16 LAB — POCT GLYCOSYLATED HEMOGLOBIN (HGB A1C): Hemoglobin A1C: 7.7 % — AB (ref 4.0–5.6)

## 2022-03-16 MED ORDER — DAPAGLIFLOZIN PROPANEDIOL 5 MG PO TABS: 5.0000 mg | ORAL_TABLET | Freq: Every day | ORAL | 6 refills | Status: DC

## 2022-03-16 MED ORDER — EMPAGLIFLOZIN 10 MG PO TABS: 10.0000 mg | ORAL_TABLET | Freq: Every day | ORAL | 3 refills | Status: DC

## 2022-03-16 MED ORDER — GLIPIZIDE 5 MG PO TABS: 5.0000 mg | ORAL_TABLET | Freq: Every day | ORAL | 3 refills | Status: DC

## 2022-03-16 NOTE — Patient Instructions (Addendum)
-   Continue Metformin 1 tablet with Breakfast and 1 tablet with Supper ?- Start Glipizide 5 mg, 1 tablet before first meal of the day  ? ? ? ? ?HOW TO TREAT LOW BLOOD SUGARS (Blood sugar LESS THAN 70 MG/DL) ?Please follow the RULE OF 15 for the treatment of hypoglycemia treatment (when your (blood sugars are less than 70 mg/dL)  ? ?STEP 1: Take 15 grams of carbohydrates when your blood sugar is low, which includes:  ?3-4 GLUCOSE TABS  OR ?3-4 OZ OF JUICE OR REGULAR SODA OR ?ONE TUBE OF GLUCOSE GEL   ? ?STEP 2: RECHECK blood sugar in 15 MINUTES ?STEP 3: If your blood sugar is still low at the 15 minute recheck --> then, go back to STEP 1 and treat AGAIN with another 15 grams of carbohydrates. ? ?

## 2022-03-16 NOTE — Progress Notes (Signed)
? ?Name: Seth [name]  ?Age/ Sex: 81 y.o., male   ?MRN/ DOB: [id number], Mar 10, 1941    ? ?PCP: Orma Render, NP   ?Reason for Endocrinology Evaluation: Type 2 Diabetes Mellitus  ?Initial Endocrine Consultative Visit: 10/09/2019  ? ? ?PATIENT IDENTIFIER: Seth [name] is a 81 y.o. male with a past medical history of T2DM, HTN, PVD, CAD, A.Fib and Prostate Ca ( S/P chemo and radiation). The patient has followed with Endocrinology clinic since 10/09/2019 for consultative assistance with management of his diabetes. ? ? ? ? ? ?DIABETIC HISTORY:  ?Seth [name] was diagnosed with T2DM many years ago.  He was prescribed Jardiance in the past, but unable to use due to cost.  hemoglobin A1c has ranged from 6.4% in 2015, peaking at 11.7% in 2020 ?On his initial visit to our clinic his A1c was 9.7%, he was on glipizide, Toujeo, and metformin.  We switched his Toujeo to Humalog mix, we stopped glipizide and continued Metformin. ? ?Lives with wife  ? ? ?Wife discontinued insulin mix 01/2022 due to hypoglycemia ?SUBJECTIVE:  ? ?During the last visit (11/12/2021): A1c 7.7%.  We adjusted Humalog mix and continued Metformin ? ?Today (03/16/2022): Seth [name] is here for a follow-up on diabetes management. Accompanied by wife  . He checks his blood sugars multiple times a day through the CGM. The patient has not had hypoglycemic episodes since the last clinic visit. ? ? ?Per wife due to hypoglycemia in February . She stopped the insulin 2/14th . She has calibrated with a finger stick with an reading difference of 3-4 point  ? ?Denies nausea, vomiting or diarrhea  ? ?He is on BIPAP  ?He also sees urology  ? ? ? ? ?HOME DIABETES REGIMEN:  ?Humalog Mix 28 units with Breakfast and 30 units with Supper - not taking  ?Metformin 1000 mg BID  ? ? ? ?Statin: Yes ?ACE-I/ARB: Yes ? ? ? ? ?CONTINUOUS GLUCOSE MONITORING RECORD INTERPRETATION   ? ?Dates of Recording: 3/9-3/22/2023 ? ?Sensor description: dexcom ? ?Results statistics: ?  ?CGM  use % of time 100  ?Average and SD 203/44  ?Time in range   36 %  ?% Time Above 180 45  ?% Time above 250 18  ?% Time Below target <1  ? ? ? ?Glycemic patterns summary: Hyperglycemia noted during the day ,and optimal at night ? ?Hyperglycemic episodes  postprandial  ? ?Hypoglycemic episodes occurred N/A ? ?Overnight periods: stable ? ? ? ? ? ? ?DIABETIC COMPLICATIONS: ?Microvascular complications:  ?Neuropathy  ?Denies: CKD, retinopathy  ?Last eye exam: Completed 12/2019 ?  ?Macrovascular complications:  ?CAD, PVD ?Denies: CVA ? ? ?HISTORY:  ?Past Medical History:  ?Past Medical History:  ?Diagnosis Date  ? Acidosis 07/19/2021  ? Acute on chronic diastolic (congestive) heart failure ([place]) 09/30/2014  ? Atrial fibrillation ([place])   ? a. coumadin;  b. Amiodarone  ? CAD (coronary artery disease)   ? a.  Lexiscan Myoview (02/14/14):  Apical, inferolateral scar; ? Mild ischemia; EF 32%.;  b.  LHC (02/21/14):  dLM 100%, LAD 100%, CFX 100%, dRCA 50%. => CABG (L-LAD, S-OM, S-PDA)  ? Chronic systolic heart failure ([place])   ? CKD (chronic kidney disease)   ? proteinuria  ? Diabetes mellitus with peripheral autonomic neuropathy ([place]) 09/30/2014  ? Hemorrhage of gastrointestinal tract, unspecified   ? Hemorrhage of rectum and anus   ? History of urinary stone 10/08/2007  ? HLD (hyperlipidemia)   ? HTN (hypertension)   ?  Hypertension, benign 06/10/2010  ? Impotence of organic origin   ? Ischemic cardiomyopathy   ? a. Echo (02/17/14):  EF 35-40%, anteroseptal and apical AK, Gr 2 DD, MAC, mod LAE.  ? Nephrolithiasis   ? Obesity, unspecified 11/11/2009  ? Peripheral vascular disease, unspecified ([place])   ? Prostate cancer ([place])   ? Proteinuria 09/30/2009  ? Thrombocytopenia, unspecified ([place])   ? Tobacco use disorder   ? Tobacco user 11/11/2009  ? Type 2 diabetes mellitus with diabetic peripheral angiopathy without gangrene ([place]) 07/19/2021  ? Type II or unspecified type diabetes mellitus with renal manifestations, uncontrolled(250.42)   ?  Urolithiasis 12/26/1997  ? ?Past Surgical History:  ?Past Surgical History:  ?Procedure Laterality Date  ? BACK SURGERY    ? CATARACT EXTRACTION  2010  ? CLIPPING OF ATRIAL APPENDAGE Left 02/24/2014  ? Procedure: CLIPPING OF ATRIAL APPENDAGE;  Surgeon: Gaye Pollack, MD;  Location: [facility];  Service: Open Heart Surgery;  Laterality: Left;  ? COLONOSCOPY  2005  ? Normal  ? CORONARY ARTERY BYPASS GRAFT N/A 02/24/2014  ? Procedure: Coronary artery bypass graft times three using left internal mammary artery and left leg greater saphenous vein harvested endoscopically.;  Surgeon: Gaye Pollack, MD;  Location: [facility] OR;  Service: Open Heart Surgery;  Laterality: N/A;  ? INTRAOPERATIVE TRANSESOPHAGEAL ECHOCARDIOGRAM N/A 02/24/2014  ? Procedure: INTRAOPERATIVE TRANSESOPHAGEAL ECHOCARDIOGRAM;  Surgeon: Gaye Pollack, MD;  Location: [facility] OR;  Service: Open Heart Surgery;  Laterality: N/A;  ? LEFT HEART CATHETERIZATION WITH CORONARY ANGIOGRAM N/A 02/21/2014  ? Procedure: LEFT HEART CATHETERIZATION WITH CORONARY ANGIOGRAM;  Surgeon: Peter M Martinique, MD;  Location: [facility] CATH LAB;  Service: Cardiovascular;  Laterality: N/A;  ? ?Social History:  reports that he has never smoked. He quit smokeless tobacco use about 8 years ago.  His smokeless tobacco use included chew. He reports that he does not drink alcohol and does not use drugs. ?Family History:  ?Family History  ?Problem Relation Age of Onset  ? Cancer Mother 44  ?     leukemia  ? Heart disease Father   ? Heart attack Father 55  ? Lung cancer Brother   ? Colon cancer Neg Hx   ? Rectal cancer Neg Hx   ? ? ? ?HOME MEDICATIONS: ?Allergies as of 03/16/2022   ?No Known Allergies ?  ? ?  ?Medication List  ?  ? ?  ? Accurate as of March 16, 2022  4:13 PM. If you have any questions, ask your nurse or doctor.  ?  ?  ? ?  ? ?STOP taking these medications   ? ?Insulin Lispro Prot & Lispro (75-25) 100 UNIT/ML Kwikpen ?Commonly known as: HumaLOG Mix 75/25 KwikPen ?Stopped by: Dorita Sciara, MD ?   ? ?  ? ?TAKE these medications   ? ?aspirin EC 81 MG tablet ?Take 1 tablet (81 mg total) by mouth daily. ?  ?atorvastatin 40 MG tablet ?Commonly known as: LIPITOR ?Take 1 tablet (40 mg total) by mouth daily. ?  ?bumetanide 1 MG tablet ?Commonly known as: BUMEX ?Take 1 mg by mouth daily. ?  ?Calcium Carbonate-Vitamin D 600-200 MG-UNIT Tabs ?Take 1 tablet by mouth daily. For bones ?  ?carvedilol 12.5 MG tablet ?Commonly known as: COREG ?Take 1 tablet (12.5 mg total) by mouth 2 (two) times daily with a meal. ?  ?Dexcom G6 Receiver Devi ?1 Device by Does not apply route as directed. ?  ?Dexcom G6 Sensor Misc ?1 Device by Does  not apply route as directed. ?  ?Dexcom G6 Transmitter Misc ?1 Device by Does not apply route as directed. ?  ?empagliflozin 10 MG Tabs tablet ?Commonly known as: Jardiance ?Take 1 tablet (10 mg total) by mouth daily before breakfast. ?Started by: Dorita Sciara, MD ?  ?FLAXSEED OIL PO ?Take 1 tablet by mouth daily. ?  ?glipiZIDE 5 MG tablet ?Commonly known as: GLUCOTROL ?Take 1 tablet (5 mg total) by mouth daily before breakfast. ?Started by: Dorita Sciara, MD ?  ?Linseed Oil Oil ?Daily, 0 Refill(s) ?  ?lisinopril 40 MG tablet ?Commonly known as: ZESTRIL ?Take 1 tablet (40 mg total) by mouth 2 (two) times daily. ?  ?metFORMIN 1000 MG tablet ?Commonly known as: GLUCOPHAGE ?TAKE 1 TABLET BY MOUTH TWICE DAILY WITH MEALS ?  ?MULTIVITAMINS PO ?Take 1 tablet by mouth daily. ?  ?potassium chloride SA 20 MEQ tablet ?Commonly known as: KLOR-CON M ?Take 2 tablets (40 mEq total) by mouth daily. ?  ?torsemide 20 MG tablet ?Commonly known as: DEMADEX ?Take 1 tablet (20 mg total) by mouth daily. ?  ?warfarin 5 MG tablet ?Commonly known as: COUMADIN ?Take as directed by the anticoagulation clinic. If you are unsure how to take this medication, talk to your nurse or doctor. ?Original instructions: Take 1 and 1/2 tablets daily by mouth or as directed by Anticoagulation Clinic. ?  ? ?   ? ? ? ?OBJECTIVE:  ? ?Vital Signs: BP 118/72 (BP Location: Left Arm, Patient Position: Sitting, Cuff Size: Large)   Pulse 64   Ht _0  (1.778 m)   Wt 230 lb (104.3 kg)   SpO2 99%   BMI 33.00 kg/m?   ?Wt Readings from Last

## 2022-03-17 ENCOUNTER — Encounter: Payer: Self-pay | Admitting: Internal Medicine

## 2022-03-31 ENCOUNTER — Ambulatory Visit (HOSPITAL_BASED_OUTPATIENT_CLINIC_OR_DEPARTMENT_OTHER): Payer: Medicare Other | Admitting: Nurse Practitioner

## 2022-04-06 ENCOUNTER — Other Ambulatory Visit (HOSPITAL_BASED_OUTPATIENT_CLINIC_OR_DEPARTMENT_OTHER): Payer: Self-pay | Admitting: General Practice

## 2022-04-07 ENCOUNTER — Other Ambulatory Visit: Payer: Self-pay

## 2022-04-07 DIAGNOSIS — I4891 Unspecified atrial fibrillation: Secondary | ICD-10-CM

## 2022-04-07 MED ORDER — WARFARIN SODIUM 5 MG PO TABS: ORAL_TABLET | ORAL | 0 refills | Status: DC

## 2022-04-07 MED ORDER — METFORMIN HCL 1000 MG PO TABS: ORAL_TABLET | ORAL | 1 refills | Status: DC

## 2022-04-07 NOTE — Telephone Encounter (Signed)
PCP should refill this medication. ?

## 2022-04-07 NOTE — Telephone Encounter (Signed)
Prescription refill request received for warfarin ?Lov:11/22/21 Marilynn Rail) ?Next INR check: 04/13/22 ?Warfarin tablet strength: '5mg'$  ? ?Appropriate dose and refill sent to requested pharmacy.  ?

## 2022-04-12 ENCOUNTER — Encounter (HOSPITAL_BASED_OUTPATIENT_CLINIC_OR_DEPARTMENT_OTHER): Payer: Self-pay | Admitting: Nurse Practitioner

## 2022-04-12 ENCOUNTER — Ambulatory Visit (INDEPENDENT_AMBULATORY_CARE_PROVIDER_SITE_OTHER): Payer: Medicare Other | Admitting: Nurse Practitioner

## 2022-04-12 VITALS — BP 130/88 | HR 96 | Temp 98.7°F | Ht 70.0 in | Wt 229.4 lb

## 2022-04-12 DIAGNOSIS — Z6841 Body Mass Index (BMI) 40.0 and over, adult: Secondary | ICD-10-CM

## 2022-04-12 DIAGNOSIS — Z794 Long term (current) use of insulin: Secondary | ICD-10-CM

## 2022-04-12 DIAGNOSIS — N184 Chronic kidney disease, stage 4 (severe): Secondary | ICD-10-CM | POA: Diagnosis not present

## 2022-04-12 DIAGNOSIS — E66813 Obesity, class 3: Secondary | ICD-10-CM

## 2022-04-12 DIAGNOSIS — I130 Hypertensive heart and chronic kidney disease with heart failure and stage 1 through stage 4 chronic kidney disease, or unspecified chronic kidney disease: Secondary | ICD-10-CM

## 2022-04-12 DIAGNOSIS — I739 Peripheral vascular disease, unspecified: Secondary | ICD-10-CM

## 2022-04-12 DIAGNOSIS — E1143 Type 2 diabetes mellitus with diabetic autonomic (poly)neuropathy: Secondary | ICD-10-CM | POA: Diagnosis not present

## 2022-04-12 DIAGNOSIS — Z23 Encounter for immunization: Secondary | ICD-10-CM

## 2022-04-12 DIAGNOSIS — N183 Chronic kidney disease, stage 3 unspecified: Secondary | ICD-10-CM

## 2022-04-12 DIAGNOSIS — R531 Weakness: Secondary | ICD-10-CM | POA: Diagnosis not present

## 2022-04-12 DIAGNOSIS — E1159 Type 2 diabetes mellitus with other circulatory complications: Secondary | ICD-10-CM | POA: Diagnosis not present

## 2022-04-12 DIAGNOSIS — I25708 Atherosclerosis of coronary artery bypass graft(s), unspecified, with other forms of angina pectoris: Secondary | ICD-10-CM

## 2022-04-12 DIAGNOSIS — R6889 Other general symptoms and signs: Secondary | ICD-10-CM | POA: Diagnosis not present

## 2022-04-12 DIAGNOSIS — E785 Hyperlipidemia, unspecified: Secondary | ICD-10-CM

## 2022-04-12 DIAGNOSIS — E559 Vitamin D deficiency, unspecified: Secondary | ICD-10-CM

## 2022-04-12 DIAGNOSIS — N1831 Chronic kidney disease, stage 3a: Secondary | ICD-10-CM

## 2022-04-12 DIAGNOSIS — E1165 Type 2 diabetes mellitus with hyperglycemia: Secondary | ICD-10-CM | POA: Diagnosis not present

## 2022-04-12 DIAGNOSIS — E1122 Type 2 diabetes mellitus with diabetic chronic kidney disease: Secondary | ICD-10-CM | POA: Diagnosis not present

## 2022-04-12 DIAGNOSIS — E1129 Type 2 diabetes mellitus with other diabetic kidney complication: Secondary | ICD-10-CM | POA: Diagnosis not present

## 2022-04-12 DIAGNOSIS — I10 Essential (primary) hypertension: Secondary | ICD-10-CM

## 2022-04-12 DIAGNOSIS — R809 Proteinuria, unspecified: Secondary | ICD-10-CM | POA: Diagnosis not present

## 2022-04-12 MED ORDER — ZOSTER VAC RECOMB ADJUVANTED 50 MCG/0.5ML IM SUSR
0.5000 mL | Freq: Once | INTRAMUSCULAR | 1 refills | Status: AC
Start: 2022-04-12 — End: 2022-04-12

## 2022-04-12 NOTE — Patient Instructions (Addendum)
It was a pleasure seeing you today. I hope your time spent with [place] was pleasant and helpful. Please let us know if there is anything we can do to improve the service you receive.  ? ?Today we discussed concerns with: ? ?Need for tetanus booster ?Need for shingles vaccine ?Forgetfulness ?Falls ? ?When you get [place] from the mountains, let me know and we can send in the referral for physical therapy.  ?I recommend walking and doing exercises to help with your balance and strength. ? ?I recommend staying active physically and with your mind to help keep your brain going.  ? ?I do recommend taking the shingles vaccine and the tetanus vaccine. You can get both of these from the pharmacy.  ? ?If you feel like the forgetfulness is getting worse let me know. We will check the labs to make sure that nothing is going on that could be causing some of the changes.  ? ? ?Important Office Information ?Lab Results ?If labs were ordered, please note that you will see results through [place] as soon as they come available from [place].  ?It takes up to 5 business days for the results to be routed to me and for me to review them once all of the lab results have come through from [facility]. I will make recommendations based on your results and send these through [place] or someone from the office will call you to discuss. If your labs are abnormal, we may contact you to schedule a visit to discuss the results and make recommendations.  ?If you have not heard from [place] within 5 business days or you have waited longer than a week and your lab results have not come through on [place], please feel free to call the office or send a message through [place] to follow-up on these labs.  ? ?Referrals ?If referrals were placed today, the office where the referral was sent will contact you either by phone or through [place] to set up scheduling. Please note that it can take up to a week for the referral office to contact you. If you do not hear from  them in a week, please contact the referral office directly to inquire about scheduling.  ? ?Condition Treated ?If your condition worsens or you begin to have new symptoms, please schedule a follow-up appointment for further evaluation. If you are not sure if an appointment is needed, you may call the office to leave a message for the nurse and someone will contact you with recommendations.  ?If you have an urgent or life threatening emergency, please do not call the office, but seek emergency evaluation by calling 911 or going to the nearest emergency room for evaluation.  ? ?MyChart and Phone Calls ?Please do not use MyChart for urgent messages. It may take up to 3 business days for MyChart messages to be read by staff and if they are unable to handle the request, an additional 3 business days for them to be routed to me and for my response.  ?Messages sent to the provider through [place] do not come directly to the provider, please allow time for these messages to be routed and for me to respond.  ?We get a large volume of MyChart messages daily and these are responded to in the order received.  ? ?For urgent messages, please call the office at [phone] and speak with the front office staff or leave a message on the line of my assistant for guidance.  ?We are  seeing patients from the hours of 8:00 am through 5:00 pm and calls directly to the nurse may not be answered immediately due to seeing patients, but your call will be returned as soon as possible.  ?Phone  messages received after 4:00 PM Monday through Thursday may not be returned until the following business day. Phone messages received after 11:00 AM on Friday may not be returned until Monday.  ? ?After Hours ?We share on call hours with providers from other offices. If you have an urgent need after hours that cannot wait until the next business day, please contact the on call provider by calling the office number. A nurse will speak with you and  contact the provider if needed for recommendations.  ?If you have an urgent or life threatening emergency after hours, please do not call the on call provider, but seek emergency evaluation by calling 911 or going to the nearest emergency room for evaluation.  ? ?Paperwork ?All paperwork requires a minimum of 5 days to complete and return to you or the designated personnel. Please keep this in mind when bringing in forms or sending requests for paperwork completion to the office.  ?  ?

## 2022-04-12 NOTE — Progress Notes (Signed)
?Worthy Keeler, DNP, AGNP-c ?[facility] ?[address] ?[address] ?[place], [place] [address] ?[phone] Office [phone] Fax ? ?ESTABLISHED PATIENT- Chronic Health and/or Follow-Up Visit ? ?Blood pressure 130/88, pulse 96, temperature 98.7 ?F (37.1 ?C), height '5\' 10"'$  (1.778 m), weight 229 lb 6.4 oz (104.1 kg), SpO2 97 %. ? ?No chief complaint on file. ? ? ?HPI ? ?Seth [name]  is a 81 y.o. year old male presenting with his wife today for evaluation and management of the following: ?CPAP  ?Recent evaluation showed OSA ?Started on CPAP ?Using and doing well ?No alarm symptoms ?Sleeping OK with this ?Using about 4 hours most nights ?Eye exams are up to date ?Foot exam is up to date ?Needs shingles and tetanus vaccines ?Increased confusion ?Wife reports increased confusion and forgetfulness ?Examples ?Forgetting what his blood sugar was when he took it 30 minutes ago ?Forgetting whether he has taken his medications ?Patient endorses that he does sometimes forget certain things, but feels that his memory is intact for the most part ?He reports that he feels this is due to not paying close attention ?He has started putting his medications in one place and has a system to identify if he has taken that days dose ?Falls ?He did have a fall about a month ago ?No injury ?Balance is not stable ?Wife requests PT evaluation due to balance and falls ?[place], so she wishes to have PT ordered at the end of summer ?Wife expresses concerns that he is not active enough and feels that he needs increased exercise in addition to walking ?She requests that we reiterate that exercise is good for his core strength and balance and walking is not enough ?Patient denies lightheadedness, dizziness, vision changes, CP ?Annual Wellness Visit ?Message to have this completed over the phone ?Will call to schedule this.   ? ?ROS ?All ROS negative with exception of what is listed in HPI ? ?PHYSICAL EXAM ?Physical Exam ?Vitals and nursing note reviewed.  ?Constitutional:   ?   General: He is not in acute distress. ?   Appearance: Normal appearance. He is obese. He is not ill-appearing.  ?HENT:  ?   Head: Normocephalic.  ?Eyes:  ?   Extraocular Movements: Extraocular movements intact.  ?   Conjunctiva/sclera: Conjunctivae normal.  ?   Pupils: Pupils are equal, round, and reactive to light.  ?Neck:  ?   Vascular: No carotid bruit.  ?Cardiovascular:  ?   Rate and Rhythm: Normal rate and regular rhythm.  ?   Pulses: Normal pulses.  ?   Heart sounds: Normal heart sounds.  ?Pulmonary:  ?   Effort: Pulmonary effort is normal.  ?   Breath sounds: Normal breath sounds.  ?Abdominal:  ?   General: Bowel sounds are normal. There is no distension.  ?   Palpations: Abdomen is soft.  ?   Tenderness: There is no abdominal tenderness. There is no guarding.  ?Musculoskeletal:  ?   Right lower leg: No edema.  ?   Left lower leg: No edema.  ?Lymphadenopathy:  ?   Cervical: No cervical adenopathy.  ?Skin: ?   General: Skin is warm and dry.  ?   Capillary Refill: Capillary refill takes less than 2 seconds.  ?Neurological:  ?   General: No focal deficit present.  ?   Mental Status: He is alert and oriented to person, place, and time.  ?  Sensory: No sensory deficit.  ?   Motor: Weakness present.  ?   Coordination: Coordination normal.  ?   Comments: Walking with a cane for stability. Gait intact, but slow. No gross weakness present.   ?Psychiatric:     ?   Mood and Affect: Mood normal.     ?   Behavior: Behavior normal.     ?   Thought Content: Thought content normal.     ?   Judgment: Judgment normal.  ? ? ?ASSESSMENT & PLAN ?Problem List Items Addressed This Visit   ? ? PVD (peripheral vascular disease) ([place])  ?  PVD is present, but appears to be stable at this time. Mild non-pitting edema present. No warmth, tenderness, erythema present. No mottling.  Sensation decreased, which may be contributing to falls. Recommend continued monitoring, keep feet elevated when seated. Discussed exercise and walking with patient that will be helpful to overall health.   ? ?  ?  ? HTN (hypertension)  ?  Chronic. Stable at this time. No alarm sx present. Continue current medication regimen and close monitoring.  ? ?  ?  ? Type 2 diabetes mellitus with vascular disease ([place])  ?  Chronic. No alarm sx present at this time. Last labs 1 month ago show elevated in A1c to 7.7%. Recommend increased physical activity and monitoring of diet to reduce carbohydrates to no more than 180 grams divided throughout the day. Goal A1c less than 8 based on age.  ? ?  ?  ? Relevant Orders  ? B12 and Folate Panel (Completed)  ? VITAMIN D 25 Hydroxy (Vit-D Deficiency, Fractures) (Completed)  ? TSH (Completed)  ? CBC with Differential/Platelet (Completed)  ? Coronary artery disease of bypass graft of native heart with stable angina pectoris ([place])  ?  Chronic. No alarm sx present today. Recent fall does not appear to be related to cardiac condition. I do recommend he continue with regular physical activity and close monitoring of BG and lipid control for management. Continue with current medications.  ? ?  ?  ? Type 2 diabetes mellitus with stage 3a chronic kidney disease, with long-term current use of insulin ([place])  ?  Chronic. A1c 7.7% 1 month ago. Goal A1c for age and health status <8%. He has stopped farxiga- unclear why at this time, but I will review records to see if we can determine cause. Continue current medications. Will monitor kidney function today.  ? ?  ?  ? Relevant Orders  ? B12 and Folate Panel (Completed)  ? VITAMIN D 25 Hydroxy (Vit-D Deficiency, Fractures) (Completed)  ? TSH (Completed)  ? CBC with Differential/Platelet (Completed)  ? 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  Chronic. Labs today. BP well controlled at this time with no alarm sx present. Mild edema present in LE, but this is late afternoon. Recommend continue with current medications and monitor closely. Increase walking and core exercises, keep feet elevated when seated, avoid medications that may be hard on the kidneys. Was previously on farxiga, but this has been stopped. Will monitor closely.  ? ?  ?  ? Relevant Orders  ? B12 and Folate Panel (Completed)  ? VITAMIN D 25 Hydroxy (Vit-D Deficiency, Fractures) (Completed)  ? TSH (Completed)  ? CBC with Differential/Platelet (Completed)  ? Weakness generalized  ?  Generalized weakness is present with abnormal gait requiring  a cane for assistance with ambulation at this time. I do feel that he would benefit from PT and conditioning exercises to help improve his stability and overall healthy. Unfortunately, [place] mountains most [place]. I will be happy to send order for PT as soon as they returned. Discussed with the patient the importance of regular exercise and strengthening. He will work on this over the summer- wife formerly worked in [employer] and she is willing to help with this. Will plan to f/u at the end of summer when they have returned to [place] full time. Monitor for falls, continue to use cane for assistance.  ? ?  ?  ? Relevant Orders  ? B12 and Folate Panel (Completed)  ? VITAMIN D 25 Hydroxy (Vit-D Deficiency, Fractures) (Completed)  ? TSH (Completed)  ? CBC with Differential/Platelet (Completed)  ? Dyslipidemia  ?  Chronic. In setting of DM, CKD, CVD, Obesity risks increased of serious event. Will monitor labs today.  ? ?  ?  ? Relevant Orders  ? B12 and Folate Panel (Completed)  ? VITAMIN D 25 Hydroxy (Vit-D Deficiency, Fractures) (Completed)  ? TSH (Completed)  ? CBC with Differential/Platelet (Completed)  ? Forgetfulness - Primary  ?  Increasing forgetfulness. In the setting of multiple chronic diseases, it is unclear if this  is related to CV causes, abnormality in labs, or possible Rondy Krupinski onset dementia. We will obtain labs for evaluation today. Discussed the option of neurology evaluation. If no other cause found, will consider neuro

## 2022-04-13 ENCOUNTER — Ambulatory Visit (INDEPENDENT_AMBULATORY_CARE_PROVIDER_SITE_OTHER): Payer: Medicare Other

## 2022-04-13 DIAGNOSIS — Z5181 Encounter for therapeutic drug level monitoring: Secondary | ICD-10-CM | POA: Diagnosis not present

## 2022-04-13 DIAGNOSIS — I4891 Unspecified atrial fibrillation: Secondary | ICD-10-CM | POA: Diagnosis not present

## 2022-04-13 LAB — CBC WITH DIFFERENTIAL/PLATELET
Basophils Absolute: 0 10*3/uL (ref 0.0–0.2)
Basos: 0 %
EOS (ABSOLUTE): 0.3 10*3/uL (ref 0.0–0.4)
Eos: 3 %
Hematocrit: 35.2 % — ABNORMAL LOW (ref 37.5–51.0)
Hemoglobin: 11.2 g/dL — ABNORMAL LOW (ref 13.0–17.7)
Immature Grans (Abs): 0 10*3/uL (ref 0.0–0.1)
Immature Granulocytes: 0 %
Lymphocytes Absolute: 2.3 10*3/uL (ref 0.7–3.1)
Lymphs: 25 %
MCH: 27.7 pg (ref 26.6–33.0)
MCHC: 31.8 g/dL (ref 31.5–35.7)
MCV: 87 fL (ref 79–97)
Monocytes Absolute: 1 10*3/uL — ABNORMAL HIGH (ref 0.1–0.9)
Monocytes: 11 %
Neutrophils Absolute: 5.6 10*3/uL (ref 1.4–7.0)
Neutrophils: 61 %
Platelets: 167 10*3/uL (ref 150–450)
RBC: 4.04 x10E6/uL — ABNORMAL LOW (ref 4.14–5.80)
RDW: 13.6 % (ref 11.6–15.4)
WBC: 9.3 10*3/uL (ref 3.4–10.8)

## 2022-04-13 LAB — POCT INR: INR: 3.1 — AB (ref 2.0–3.0)

## 2022-04-13 LAB — VITAMIN D 25 HYDROXY (VIT D DEFICIENCY, FRACTURES): Vit D, 25-Hydroxy: 37 ng/mL (ref 30.0–100.0)

## 2022-04-13 LAB — TSH: TSH: 2.26 u[IU]/mL (ref 0.450–4.500)

## 2022-04-13 LAB — B12 AND FOLATE PANEL
Folate: >20 ng/mL (ref >3.0)
Vitamin B-12: 408 pg/mL (ref 232–1245)

## 2022-04-13 NOTE — Patient Instructions (Signed)
Description   ?Eat a serving of greens today and continue on same dosage of Warfarin 1.5 tablets daily.  ?Stay consistent with greens each week (2-3 times per week)  ?Recheck INR in 6 weeks.  ?[facility] [phone].  ?  ?   ?

## 2022-04-19 ENCOUNTER — Encounter (HOSPITAL_BASED_OUTPATIENT_CLINIC_OR_DEPARTMENT_OTHER): Payer: Self-pay | Admitting: Nurse Practitioner

## 2022-04-21 DIAGNOSIS — R6889 Other general symptoms and signs: Secondary | ICD-10-CM | POA: Insufficient documentation

## 2022-04-21 NOTE — Assessment & Plan Note (Signed)
Generalized weakness is present with abnormal gait requiring a cane for assistance with ambulation at this time. I do feel that he would benefit from PT and conditioning exercises to help improve his stability and overall healthy. Unfortunately, [place] most [place]. I will be happy to send order for PT as soon as they returned. Discussed with the patient the importance of regular exercise and strengthening. He will work on this over the summer- wife formerly worked in [employer] and she is willing to help with this. Will plan to f/u at the end of summer when they have returned to [place] full time. Monitor for falls, continue to use cane for assistance.  ?

## 2022-04-21 NOTE — Assessment & Plan Note (Signed)
Chronic. Labs today. BP well controlled at this time with no alarm sx present. Mild edema present in LE, but this is late afternoon. Recommend continue with current medications and monitor closely. Increase walking and core exercises, keep feet elevated when seated, avoid medications that may be hard on the kidneys. Was previously on farxiga, but this has been stopped. Will monitor closely.  ?

## 2022-04-21 NOTE — Assessment & Plan Note (Signed)
>>  ASSESSMENT AND PLAN FOR DYSLIPIDEMIA WRITTEN ON 04/21/2022  7:50 AM BY Blimy Napoleon E, NP  Chronic. In setting of DM, CKD, CVD, Obesity risks increased of serious event. Will monitor labs today.

## 2022-04-21 NOTE — Assessment & Plan Note (Addendum)
PVD is present, but appears to be stable at this time. Mild non-pitting edema present. No warmth, tenderness, erythema present. No mottling. Sensation decreased, which may be contributing to falls. Recommend continued monitoring, keep feet elevated when seated. Discussed exercise and walking with patient that will be helpful to overall health.   ?

## 2022-04-21 NOTE — Assessment & Plan Note (Addendum)
>>  ASSESSMENT AND PLAN FOR HTN (HYPERTENSION) WRITTEN ON 04/21/2022  7:40 AM BY Jaydeen Odor E, NP  Chronic. Stable at this time. No alarm sx present. Continue current medication regimen and close monitoring.   >>ASSESSMENT AND PLAN FOR HYPERTENSIVE HEART AND CHRONIC KIDNEY DISEASE WITH HEART FAILURE AND STAGE 1 THROUGH STAGE 4 CHRONIC KIDNEY DISEASE, OR UNSPECIFIED CHRONIC KIDNEY DISEASE WRITTEN ON 04/21/2022  7:46 AM BY Ayelen Sciortino E, NP  Chronic. Labs today. BP well controlled at this time with no alarm sx present. Mild edema present in LE, but this is late afternoon. Recommend continue with current medications and monitor closely. Increase walking and core exercises, keep feet elevated when seated, avoid medications that may be hard on the kidneys. Was previously on farxiga, but this has been stopped. Will monitor closely.

## 2022-04-21 NOTE — Assessment & Plan Note (Signed)
Chronic. No alarm sx present today. Recent fall does not appear to be related to cardiac condition. I do recommend he continue with regular physical activity and close monitoring of BG and lipid control for management. Continue with current medications.  ?

## 2022-04-21 NOTE — Assessment & Plan Note (Addendum)
Chronic. No alarm sx present at this time. Last labs 1 month ago show elevated in A1c to 7.7%. Recommend increased physical activity and monitoring of diet to reduce carbohydrates to no more than 180 grams divided throughout the day. Goal A1c less than 8 based on age.  ?

## 2022-04-21 NOTE — Assessment & Plan Note (Signed)
Increasing forgetfulness. In the setting of multiple chronic diseases, it is unclear if this is related to CV causes, abnormality in labs, or possible Seth [name] onset dementia. We will obtain labs for evaluation today. Discussed the option of neurology evaluation. If no other cause found, will consider neuro referral for further evaluation and treatment recommendations. ?

## 2022-04-21 NOTE — Assessment & Plan Note (Signed)
Chronic. In setting of DM, CKD, CVD, Obesity risks increased of serious event. Will monitor labs today.  ?

## 2022-04-21 NOTE — Assessment & Plan Note (Addendum)
>>  ASSESSMENT AND PLAN FOR TYPE 2 DIABETES MELLITUS WITH STAGE 3A CHRONIC KIDNEY DISEASE, WITH LONG-TERM CURRENT USE OF INSULIN WRITTEN ON 04/21/2022  7:47 AM BY Tennis Mckinnon E, NP  Chronic. A1c 7.7% 1 month ago. Goal A1c for age and health status <8%. He has stopped farxiga- unclear why at this time, but I will review records to see if we can determine cause. Continue current medications. Will monitor kidney function today.   >>ASSESSMENT AND PLAN FOR TYPE 2 DIABETES MELLITUS WITH VASCULAR DISEASE WRITTEN ON 04/21/2022  7:43 AM BY Richad Ramsay E, NP  Chronic. No alarm sx present at this time. Last labs 1 month ago show elevated in A1c to 7.7%. Recommend increased physical activity and monitoring of diet to reduce carbohydrates to no more than 180 grams divided throughout the day. Goal A1c less than 8 based on age.

## 2022-05-26 DIAGNOSIS — I4891 Unspecified atrial fibrillation: Secondary | ICD-10-CM | POA: Diagnosis not present

## 2022-05-26 DIAGNOSIS — Z7901 Long term (current) use of anticoagulants: Secondary | ICD-10-CM | POA: Diagnosis not present

## 2022-05-27 DIAGNOSIS — Z7901 Long term (current) use of anticoagulants: Secondary | ICD-10-CM | POA: Diagnosis not present

## 2022-05-27 DIAGNOSIS — I4891 Unspecified atrial fibrillation: Secondary | ICD-10-CM | POA: Diagnosis not present

## 2022-05-27 DIAGNOSIS — I5020 Unspecified systolic (congestive) heart failure: Secondary | ICD-10-CM | POA: Diagnosis not present

## 2022-05-27 DIAGNOSIS — E1165 Type 2 diabetes mellitus with hyperglycemia: Secondary | ICD-10-CM | POA: Diagnosis not present

## 2022-06-13 DIAGNOSIS — R609 Edema, unspecified: Secondary | ICD-10-CM | POA: Diagnosis not present

## 2022-06-13 DIAGNOSIS — R0602 Shortness of breath: Secondary | ICD-10-CM | POA: Diagnosis not present

## 2022-06-13 DIAGNOSIS — I161 Hypertensive emergency: Secondary | ICD-10-CM | POA: Diagnosis not present

## 2022-06-13 DIAGNOSIS — I509 Heart failure, unspecified: Secondary | ICD-10-CM | POA: Diagnosis not present

## 2022-06-13 DIAGNOSIS — R300 Dysuria: Secondary | ICD-10-CM | POA: Diagnosis not present

## 2022-06-13 DIAGNOSIS — I371 Nonrheumatic pulmonary valve insufficiency: Secondary | ICD-10-CM | POA: Diagnosis not present

## 2022-06-13 DIAGNOSIS — Z79899 Other long term (current) drug therapy: Secondary | ICD-10-CM | POA: Diagnosis not present

## 2022-06-13 DIAGNOSIS — I5023 Acute on chronic systolic (congestive) heart failure: Secondary | ICD-10-CM | POA: Diagnosis not present

## 2022-06-13 DIAGNOSIS — E1122 Type 2 diabetes mellitus with diabetic chronic kidney disease: Secondary | ICD-10-CM | POA: Diagnosis not present

## 2022-06-13 DIAGNOSIS — I130 Hypertensive heart and chronic kidney disease with heart failure and stage 1 through stage 4 chronic kidney disease, or unspecified chronic kidney disease: Secondary | ICD-10-CM | POA: Diagnosis not present

## 2022-06-13 DIAGNOSIS — J90 Pleural effusion, not elsewhere classified: Secondary | ICD-10-CM | POA: Diagnosis not present

## 2022-06-13 DIAGNOSIS — R0600 Dyspnea, unspecified: Secondary | ICD-10-CM | POA: Diagnosis not present

## 2022-06-13 DIAGNOSIS — E119 Type 2 diabetes mellitus without complications: Secondary | ICD-10-CM | POA: Diagnosis not present

## 2022-06-13 DIAGNOSIS — I10 Essential (primary) hypertension: Secondary | ICD-10-CM | POA: Diagnosis not present

## 2022-06-13 DIAGNOSIS — Z87891 Personal history of nicotine dependence: Secondary | ICD-10-CM | POA: Diagnosis not present

## 2022-06-13 DIAGNOSIS — N183 Chronic kidney disease, stage 3 unspecified: Secondary | ICD-10-CM | POA: Diagnosis not present

## 2022-06-13 DIAGNOSIS — R791 Abnormal coagulation profile: Secondary | ICD-10-CM | POA: Diagnosis not present

## 2022-06-13 DIAGNOSIS — Z7901 Long term (current) use of anticoagulants: Secondary | ICD-10-CM | POA: Diagnosis not present

## 2022-06-13 DIAGNOSIS — Z7984 Long term (current) use of oral hypoglycemic drugs: Secondary | ICD-10-CM | POA: Diagnosis not present

## 2022-06-13 DIAGNOSIS — I4891 Unspecified atrial fibrillation: Secondary | ICD-10-CM | POA: Diagnosis not present

## 2022-06-13 DIAGNOSIS — I2510 Atherosclerotic heart disease of native coronary artery without angina pectoris: Secondary | ICD-10-CM | POA: Diagnosis not present

## 2022-06-13 DIAGNOSIS — Z8546 Personal history of malignant neoplasm of prostate: Secondary | ICD-10-CM | POA: Diagnosis not present

## 2022-06-13 DIAGNOSIS — Z951 Presence of aortocoronary bypass graft: Secondary | ICD-10-CM | POA: Diagnosis not present

## 2022-06-13 DIAGNOSIS — R3129 Other microscopic hematuria: Secondary | ICD-10-CM | POA: Diagnosis not present

## 2022-06-13 DIAGNOSIS — E785 Hyperlipidemia, unspecified: Secondary | ICD-10-CM | POA: Diagnosis not present

## 2022-06-13 DIAGNOSIS — Z7982 Long term (current) use of aspirin: Secondary | ICD-10-CM | POA: Diagnosis not present

## 2022-06-13 DIAGNOSIS — R0902 Hypoxemia: Secondary | ICD-10-CM | POA: Diagnosis not present

## 2022-06-17 DIAGNOSIS — Z7901 Long term (current) use of anticoagulants: Secondary | ICD-10-CM | POA: Diagnosis not present

## 2022-06-20 ENCOUNTER — Telehealth: Payer: Self-pay

## 2022-06-20 NOTE — Telephone Encounter (Signed)
Patient has received his [name] and wants to know if he need to stop the Glipizide and Metformin.

## 2022-06-23 DIAGNOSIS — Z09 Encounter for follow-up examination after completed treatment for conditions other than malignant neoplasm: Secondary | ICD-10-CM | POA: Diagnosis not present

## 2022-06-23 DIAGNOSIS — I4891 Unspecified atrial fibrillation: Secondary | ICD-10-CM | POA: Diagnosis not present

## 2022-06-23 DIAGNOSIS — I5020 Unspecified systolic (congestive) heart failure: Secondary | ICD-10-CM | POA: Diagnosis not present

## 2022-06-23 DIAGNOSIS — E1165 Type 2 diabetes mellitus with hyperglycemia: Secondary | ICD-10-CM | POA: Diagnosis not present

## 2022-06-23 DIAGNOSIS — Z7901 Long term (current) use of anticoagulants: Secondary | ICD-10-CM | POA: Diagnosis not present

## 2022-06-27 ENCOUNTER — Other Ambulatory Visit: Payer: Self-pay | Admitting: Cardiovascular Disease

## 2022-06-27 ENCOUNTER — Encounter: Payer: Medicare Other | Admitting: Nurse Practitioner

## 2022-06-27 DIAGNOSIS — I4891 Unspecified atrial fibrillation: Secondary | ICD-10-CM

## 2022-06-27 NOTE — Telephone Encounter (Signed)
Called pt since he is overdue for Anticoagulation Monitoring. [place] and [place]CP there that manges his level while there. Advised we could not fill the warfarin since he is overdue and she stated she would [place]CP [place] to take care of it. She stated once he comes back yo [place], they would call.

## 2022-06-29 ENCOUNTER — Encounter: Payer: Medicare Other | Admitting: Nurse Practitioner

## 2022-07-04 ENCOUNTER — Other Ambulatory Visit: Payer: Self-pay | Admitting: Cardiovascular Disease

## 2022-07-04 DIAGNOSIS — Z7901 Long term (current) use of anticoagulants: Secondary | ICD-10-CM | POA: Diagnosis not present

## 2022-07-04 DIAGNOSIS — I4891 Unspecified atrial fibrillation: Secondary | ICD-10-CM

## 2022-07-04 NOTE — Telephone Encounter (Signed)
Prescription refill request received for warfarin [place]:  01/31/22 Seth [name])  Next INR check: 05/25/22 Warfarin tablet strength: '5mg'$   OVERDUE FOR INR CHECK  -   Called and spoke with pt's wife. Stated [place] 6 months [place] of the [place] and will back in town in October. While they are living in the [place], Seth [name]'s INR/Warfarin is managed by PCP office [place]. I made them aware that the anticoagulation clinic can not refill Warfarin unless he has been seen in our office. Pt's wife stated the office who is currently managing [place] will refill until they come back in October and then they will schedule an appt with [facility].

## 2022-07-07 DIAGNOSIS — N183 Chronic kidney disease, stage 3 unspecified: Secondary | ICD-10-CM | POA: Diagnosis not present

## 2022-07-07 DIAGNOSIS — I10 Essential (primary) hypertension: Secondary | ICD-10-CM | POA: Diagnosis not present

## 2022-07-07 DIAGNOSIS — I5020 Unspecified systolic (congestive) heart failure: Secondary | ICD-10-CM | POA: Diagnosis not present

## 2022-07-20 ENCOUNTER — Encounter: Payer: Self-pay | Admitting: Internal Medicine

## 2022-07-20 ENCOUNTER — Ambulatory Visit (INDEPENDENT_AMBULATORY_CARE_PROVIDER_SITE_OTHER): Payer: Medicare Other | Admitting: Internal Medicine

## 2022-07-20 VITALS — BP 140/74 | HR 56 | Ht 70.0 in | Wt 218.0 lb

## 2022-07-20 DIAGNOSIS — E1165 Type 2 diabetes mellitus with hyperglycemia: Secondary | ICD-10-CM | POA: Diagnosis not present

## 2022-07-20 DIAGNOSIS — Z794 Long term (current) use of insulin: Secondary | ICD-10-CM | POA: Diagnosis not present

## 2022-07-20 DIAGNOSIS — E1152 Type 2 diabetes mellitus with diabetic peripheral angiopathy with gangrene: Secondary | ICD-10-CM

## 2022-07-20 DIAGNOSIS — E1129 Type 2 diabetes mellitus with other diabetic kidney complication: Secondary | ICD-10-CM | POA: Diagnosis not present

## 2022-07-20 DIAGNOSIS — R809 Proteinuria, unspecified: Secondary | ICD-10-CM | POA: Diagnosis not present

## 2022-07-20 DIAGNOSIS — E1122 Type 2 diabetes mellitus with diabetic chronic kidney disease: Secondary | ICD-10-CM

## 2022-07-20 DIAGNOSIS — E1143 Type 2 diabetes mellitus with diabetic autonomic (poly)neuropathy: Secondary | ICD-10-CM | POA: Diagnosis not present

## 2022-07-20 DIAGNOSIS — N1831 Chronic kidney disease, stage 3a: Secondary | ICD-10-CM

## 2022-07-20 LAB — POCT GLYCOSYLATED HEMOGLOBIN (HGB A1C): Hemoglobin A1C: 6.8 % — AB (ref 4.0–5.6)

## 2022-07-20 MED ORDER — GLIPIZIDE 5 MG PO TABS: 2.5000 mg | ORAL_TABLET | Freq: Every day | ORAL | 3 refills | Status: DC

## 2022-07-20 MED ORDER — DAPAGLIFLOZIN PROPANEDIOL 10 MG PO TABS: 10.0000 mg | ORAL_TABLET | Freq: Every day | ORAL | 3 refills | Status: DC

## 2022-07-20 NOTE — Progress Notes (Signed)
Name: Seth [name]  Age/ Sex: 81 y.o., male   MRN/ DOB: [id number], 05-19-1941     PCP: Orma Render, NP   Reason for Endocrinology Evaluation: Type 2 Diabetes Mellitus  Initial Endocrine Consultative Visit: 10/09/2019    PATIENT IDENTIFIER: Seth [name] is a 81 y.o. male with a past medical history of T2DM, HTN, PVD, CAD, A.Fib and Prostate Ca ( S/P chemo and radiation). The patient has followed with Endocrinology clinic since 10/09/2019 for consultative assistance with management of his diabetes.      DIABETIC HISTORY:  Seth [name] was diagnosed with T2DM many years ago.  He was prescribed Jardiance in the past, but unable to use due to cost.  hemoglobin A1c has ranged from 6.4% in 2015, peaking at 11.7% in 2020 On his initial visit to our clinic his A1c was 9.7%, he was on glipizide, Toujeo, and metformin.  We switched his Toujeo to Humalog mix, we stopped glipizide and continued Metformin.  Lives with wife    Wife discontinued insulin mix 01/2022 due to hypoglycemia, he was started on glipizide and Farxiga as well as continuing metformin    SUBJECTIVE:   During the last visit (03/16/2022): A1c 7.7%.  We adjusted Humalog mix and continued Metformin  Today (07/20/2022): Seth [name] is here for a follow-up on diabetes management. Accompanied by wife  . He checks his blood sugars multiple times a day through the CGM. The patient has had hypoglycemic episodes since the last clinic visit.  These are mostly occurring during the day but also have been noted overnight.  [place] , was started on spironolactone GFR was noted to increase since then, he is going to see cardiology next week Pt has been noted with weight loss   SOB has improved  No LE edema   He is on BIPAP  He also sees urology      HOME DIABETES REGIMEN:  Metformin 1000 mg BID  Glipizide 5 mg daily Farxiga 5 mg daily   Statin: Yes ACE-I/ARB:  Yes     CONTINUOUS GLUCOSE MONITORING RECORD INTERPRETATION    Dates of Recording: 7/13-7/26/2023  Sensor description: dexcom  Results statistics:   CGM use % of time 100  Average and SD 134/35  Time in range   89%  % Time Above 180 9  % Time above 250 <1  % Time Below target <1     Glycemic patterns summary: BG's optimal during the night and most of the day  Hyperglycemic episodes  postprandial   Hypoglycemic episodes occurred during the day   Overnight periods: stable       DIABETIC COMPLICATIONS: Microvascular complications:  Neuropathy  Denies: CKD, retinopathy  Last eye exam: Completed 12/2019   Macrovascular complications:  CAD, PVD Denies: CVA   HISTORY:  Past Medical History:  Past Medical History:  Diagnosis Date   Acidosis 07/19/2021   Acute on chronic diastolic (congestive) heart failure ([place]) 09/30/2014   Atrial fibrillation ([place])    a. coumadin;  b. Amiodarone   CAD (coronary artery disease)    a.  Lexiscan Myoview (02/14/14):  Apical, inferolateral scar; ? Mild ischemia; EF 32%.;  b.  LHC (02/21/14):  dLM 100%, LAD 100%, CFX 100%, dRCA 50%. => CABG (L-LAD, S-OM, S-PDA)   Chronic systolic heart failure (HCC)    CKD (chronic kidney disease)    proteinuria   Diabetes mellitus with peripheral autonomic neuropathy ([place])  09/30/2014   Hemorrhage of gastrointestinal tract, unspecified    Hemorrhage of rectum and anus    History of urinary stone 10/08/2007   HLD (hyperlipidemia)    HTN (hypertension)    Hypertension, benign 06/10/2010   Impotence of organic origin    Ischemic cardiomyopathy    a. Echo (02/17/14):  EF 35-40%, anteroseptal and apical AK, Gr 2 DD, MAC, mod LAE.   Nephrolithiasis    Obesity, unspecified 11/11/2009   Peripheral vascular disease, unspecified ([place])    Prostate cancer ([place])    Proteinuria 09/30/2009   Thrombocytopenia, unspecified ([place])    Tobacco use disorder    Tobacco user 11/11/2009   Type 2 diabetes mellitus with  diabetic peripheral angiopathy without gangrene ([place]) 07/19/2021   Type II or unspecified type diabetes mellitus with renal manifestations, uncontrolled(250.42)    Urolithiasis 12/26/1997   Past Surgical History:  Past Surgical History:  Procedure Laterality Date   BACK SURGERY     CATARACT EXTRACTION  2010   CLIPPING OF ATRIAL APPENDAGE Left 02/24/2014   Procedure: CLIPPING OF ATRIAL APPENDAGE;  Surgeon: Gaye Pollack, MD;  Location: [facility];  Service: Open Heart Surgery;  Laterality: Left;   COLONOSCOPY  2005   Normal   CORONARY ARTERY BYPASS GRAFT N/A 02/24/2014   Procedure: Coronary artery bypass graft times three using left internal mammary artery and left leg greater saphenous vein harvested endoscopically.;  Surgeon: Gaye Pollack, MD;  Location: [facility];  Service: Open Heart Surgery;  Laterality: N/A;   INTRAOPERATIVE TRANSESOPHAGEAL ECHOCARDIOGRAM N/A 02/24/2014   Procedure: INTRAOPERATIVE TRANSESOPHAGEAL ECHOCARDIOGRAM;  Surgeon: Gaye Pollack, MD;  Location: [facility];  Service: Open Heart Surgery;  Laterality: N/A;   LEFT HEART CATHETERIZATION WITH CORONARY ANGIOGRAM N/A 02/21/2014   Procedure: LEFT HEART CATHETERIZATION WITH CORONARY ANGIOGRAM;  Surgeon: Peter M Martinique, MD;  Location[facility] C[facility] LAB;  Service: Cardiovascular;  Laterality: N/A;   Social History:  reports that he has never smoked. He quit smokeless tobacco use about 8 years ago.  His smokeless tobacco use included chew. He reports that he does not drink alcohol and does not use drugs. Family History:  Family History  Problem Relation Age of Onset   Cancer Mother 35       leukemia   Heart disease Father    Heart attack Father 30   Lung cancer Brother    Colon cancer Neg Hx    Rectal cancer Neg Hx      HOME MEDICATIONS: Allergies as of 07/20/2022   No Known Allergies      Medication List        Accurate as of July 20, 2022  1:27 PM. If you have any questions, ask your nurse or doctor.          aspirin EC 81 MG  tablet Take 1 tablet (81 mg total) by mouth daily.   atorvastatin 40 MG tablet Commonly known as: LIPITOR Take 1 tablet (40 mg total) by mouth daily.   Calcium Carbonate-Vitamin D 600-200 MG-UNIT Tabs Take 1 tablet by mouth daily. For bones   carvedilol 12.5 MG tablet Commonly known as: COREG Take 1 tablet (12.5 mg total) by mouth 2 (two) times daily with a meal.   citalopram 10 MG tablet Commonly known as: CELEXA Take 10 mg by mouth daily.   cloNIDine 0.1 MG tablet Commonly known as: CATAPRES Take by mouth.   dapagliflozin propanediol 5 MG Tabs tablet Commonly known as: Farxiga Take 1 tablet (5 mg  total) by mouth daily before breakfast.   Dexcom G6 Receiver Devi 1 Device by Does not apply route as directed.   Dexcom G6 Sensor Misc 1 Device by Does not apply route as directed.   Dexcom G6 Transmitter Misc 1 Device by Does not apply route as directed.   FLAXSEED OIL PO Take 1 tablet by mouth daily.   glipiZIDE 5 MG tablet Commonly known as: GLUCOTROL Take 1 tablet (5 mg total) by mouth daily before breakfast.   hydrALAZINE 25 MG tablet Commonly known as: APRESOLINE Take 25 mg by mouth every 6 (six) hours.   Linseed Oil Oil Daily, 0 Refill(s)   lisinopril 40 MG tablet Commonly known as: ZESTRIL Take 1 tablet (40 mg total) by mouth 2 (two) times daily.   metFORMIN 1000 MG tablet Commonly known as: GLUCOPHAGE TAKE 1 TABLET BY MOUTH TWICE DAILY WITH MEALS   MULTIVITAMINS PO Take 1 tablet by mouth daily.   potassium chloride SA 20 MEQ tablet Commonly known as: KLOR-CON M Take 2 tablets (40 mEq total) by mouth daily.   spironolactone 25 MG tablet Commonly known as: ALDACTONE Take 12.5 mg by mouth daily.   torsemide 20 MG tablet Commonly known as: DEMADEX Take 1 tablet (20 mg total) by mouth daily. What changed: how much to take   warfarin 5 MG tablet Commonly known as: COUMADIN Take as directed by the anticoagulation clinic. If you are unsure how to  take this medication, talk to your nurse or doctor. Original instructions: Take 1 and 1/2 tablets daily by mouth or as directed by Anticoagulation Clinic.         OBJECTIVE:   Vital Signs: BP 140/74 (BP Location: Left Arm, Patient Position: Sitting, Cuff Size: Small)   Pulse (!) 56   Ht _0  (1.778 m)   Wt 218 lb (98.9 kg)   SpO2 96%   BMI 31.28 kg/m   Wt Readings from Last 3 Encounters:  07/20/22 218 lb (98.9 kg)  04/12/22 229 lb 6.4 oz (104.1 kg)  03/16/22 230 lb (104.3 kg)     Exam: General: Pt appears well and is in NAD  Lungs: Clear with good BS bilat with no rales, rhonchi, or wheezes  Heart: RRR with normal S1 and S2 and no gallops; no murmurs; no rub  Extremities:  No pretibial edema..  Neuro: MS is good with appropriate affect, pt is alert and Ox3       DM foot exam: 07/20/2022   The skin of the feet is  without sores or ulcerations. The pedal pulses are 1+ on right and 1+ on left. The sensation is decreased  to a screening 5.07, 10 gram monofilament bilaterally   DATA REVIEWED:  Lab Results  Component Value Date   HGBA1C 6.8 (A) 07/20/2022   HGBA1C 7.7 (A) 03/16/2022   HGBA1C 7.0 (A) 11/12/2021  07/07/2022 Na 144 K 4.7 Glucose 205 BUN 37.6 Cr 1.73 GFR 39 Ca 9.1   ASSESSMENT / PLAN / RECOMMENDATIONS:   1) Type 2 Diabetes Mellitus, Optimally  controlled, With neuropathic, CKD III and macrovascular complications - Most recent A1c of 6.8%. Goal A1c <7.0%.  -A1c at goal -Patient continues with hypoglycemia, will reduce glipizide as below -His GFR has trended down to 39, will stop metformin  -He continues with postprandial hyperglycemia with BG's trending up >200 MG/DL -He is on patient assistance for Farxiga, a prescription has been sent electronically and also will be faxed to the company for increasing the dose -  Pt has no medicare plan D  MEDICATIONS:  Stop metformin 1000 mg twice daily Decrease glipizide 5 mg, half a tablet before  breakfast Increase Farxiga 10 mg daily  EDUCATION / INSTRUCTIONS: BG monitoring instructions: Patient is instructed to check his blood sugars to times a day, before breakfast and supper. Call [facility] if: BG persistently < 70  I reviewed the Rule of 15 for the treatment of hypoglycemia in detail with the patient. Literature supplied.   2)Diabetic complications:  Eye: Does not have known diabetic retinopathy.  Neuro/ Feet: Does have known diabetic peripheral neuropathy. Renal: Patient does have known baseline CKD. He is on an ACEI/ARB at present.    F/U in 4 months     Signed electronically by: Mack Guise, MD  [facility] Endocrinology  [facility] [address] [place], [place] [address] Phone: [phone] FAX: [phone]   CC: Orma Render, NP [address] [place] [address] Phone: [phone]  Fax: [phone]  Return to Endocrinology clinic as below: Future Appointments  Date Time Provider [place]  07/27/2022 10:30 AM Marilynn Rail, Jossie Ng, NP [facility]

## 2022-07-20 NOTE — Patient Instructions (Addendum)
-   STOP Metformin  - Decrease Glipizide 5 mg, Half a tablet before Breakfast  - Increase Farxiga 10 mg daily    HOW TO TREAT LOW BLOOD SUGARS (Blood sugar LESS THAN 70 MG/DL) Please follow the RULE OF 15 for the treatment of hypoglycemia treatment (when your (blood sugars are less than 70 mg/dL)   STEP 1: Take 15 grams of carbohydrates when your blood sugar is low, which includes:  3-4 GLUCOSE TABS  OR 3-4 OZ OF JUICE OR REGULAR SODA OR ONE TUBE OF GLUCOSE GEL    STEP 2: RECHECK blood sugar in 15 MINUTES STEP 3: If your blood sugar is still low at the 15 minute recheck --> then, go back to STEP 1 and treat AGAIN with another 15 grams of carbohydrates.

## 2022-07-24 NOTE — Progress Notes (Unsigned)
Cardiology Clinic Note   Patient Name: Seth [name] Date of Encounter: 07/27/2022  Primary Care Provider:  Orma Render, NP Primary Cardiologist:  Lauree Chandler, MD  Patient Profile    Seth [name] presents to the clinic today for follow-up evaluation of his hypertension, peripheral vascular disease, and ischemic cardiomyopathy.  Past Medical History    Past Medical History:  Diagnosis Date   Acidosis 07/19/2021   Acute on chronic diastolic (congestive) heart failure ([place]) 09/30/2014   Atrial fibrillation (HCC)    a. coumadin;  b. Amiodarone   CAD (coronary artery disease)    a.  Lexiscan Myoview (02/14/14):  Apical, inferolateral scar; ? Mild ischemia; EF 32%.;  b.  LHC (02/21/14):  dLM 100%, LAD 100%, CFX 100%, dRCA 50%. => CABG (L-LAD, S-OM, S-PDA)   Chronic systolic heart failure (HCC)    CKD (chronic kidney disease)    proteinuria   Diabetes mellitus with peripheral autonomic neuropathy ([place]) 09/30/2014   Hemorrhage of gastrointestinal tract, unspecified    Hemorrhage of rectum and anus    History of urinary stone 10/08/2007   HLD (hyperlipidemia)    HTN (hypertension)    Hypertension, benign 06/10/2010   Impotence of organic origin    Ischemic cardiomyopathy    a. Echo (02/17/14):  EF 35-40%, anteroseptal and apical AK, Gr 2 DD, MAC, mod LAE.   Nephrolithiasis    Obesity, unspecified 11/11/2009   Peripheral vascular disease, unspecified ([place])    Prostate cancer ([place])    Proteinuria 09/30/2009   Thrombocytopenia, unspecified ([place])    Tobacco use disorder    Tobacco user 11/11/2009   Type 2 diabetes mellitus with diabetic peripheral angiopathy without gangrene ([place]) 07/19/2021   Type II or unspecified type diabetes mellitus with renal manifestations, uncontrolled(250.42)    Urolithiasis 12/26/1997   Past Surgical History:  Procedure Laterality Date   BACK SURGERY     CATARACT EXTRACTION  2010   CLIPPING OF ATRIAL APPENDAGE Left 02/24/2014   Procedure:  CLIPPING OF ATRIAL APPENDAGE;  Surgeon: Gaye Pollack, MD;  Location: [place];  Service: Open Heart Surgery;  Laterality: Left;   COLONOSCOPY  2005   Normal   CORONARY ARTERY BYPASS GRAFT N/A 02/24/2014   Procedure: Coronary artery bypass graft times three using left internal mammary artery and left leg greater saphenous vein harvested endoscopically.;  Surgeon: Gaye Pollack, MD;  Location: [facility];  Service: Open Heart Surgery;  Laterality: N/A;   INTRAOPERATIVE TRANSESOPHAGEAL ECHOCARDIOGRAM N/A 02/24/2014   Procedure: INTRAOPERATIVE TRANSESOPHAGEAL ECHOCARDIOGRAM;  Surgeon: Gaye Pollack, MD;  Location: [place] OR;  Service: Open Heart Surgery;  Laterality: N/A;   LEFT HEART CATHETERIZATION WITH CORONARY ANGIOGRAM N/A 02/21/2014   Procedure: LEFT HEART CATHETERIZATION WITH CORONARY ANGIOGRAM;  Surgeon: Peter M Martinique, MD;  Location: [facility] CATH LAB;  Service: Cardiovascular;  Laterality: N/A;    Allergies  No Known Allergies  History of Present Illness    Seth [name] has a PMH of atrial fibrillation, diabetes, hypertension, hyperlipidemia, coronary artery disease status post CABG, prostate cancer, renal insufficiency, and obesity.  He was initially seen 2/15 for evaluation of his atrial fibrillation and was placed on Xarelto.  He underwent stress testing which was abnormal and showed apical inferior lateral scar and ischemia.  His LVEF was around 32% echocardiogram 2/15 showed an LVEF of 35-40% with akinesis in the mid distal anterioseptal and apical portions of the myocardium.  He was also noted to have dilated left atria.  A cardiac catheterization 02/21/2014 showed occluded left main, moderate RCA stenosis with his entire left system filling from right to left collaterals.  He underwent CABG x3 02/24/14 (LIMA-LAD, SVG-OM, SVG-PDA.  Echocardiogram 05/28/2014 showed an LVEF of 35-40%, concentric LVH, and mild MR.  A follow-up echocardiogram 2020 showed an LVEF 50-55%, LVH, and no significant valvular  abnormalities.  CHA2DS2-VASc score is 6 (CHF, HTN, diabetes, vascular disease, age-37)   He followed up with [name] 11/16/2020 and denied chest pain, palpitations, dyspnea, lower extremity swelling, orthopnea, PND, dizziness, near-syncope, and syncope.   He presented to the clinic 08/03/2021 for follow-up evaluation stated he was recently admitted to [facility] for CHF exacerbation.  He presented with his wife and daughter.  He was discharged on 07/24/2019 and his weight was around 236 pounds.  His wife, retired Therapist, sports, indicated that he was having significant amount of trouble breathing, required BiPAP, and had significant lower extremity edema.  He received IV diuresis.  On discharge his furosemide was changed to torsemide.  His weight today was 232.6 pounds on his home scale from 237.7 pounds in the office.  He had been discharged home on 2 L of oxygen.  He was no longer requiring oxygen.  His oxygen saturation was in the mid 90s in the office.  He did have a sleep study scheduled at his [facility] this coming Sunday.  I reviewed the importance of low-sodium diet and medication compliance.  I requested labs from his recent hospitalization and [facility].  We decreased his torsemide to 40 mg daily given his recent weight loss.  I planned follow-up with me as scheduled on 08/17/2021, in 3 months, and with [name] in 6 months.  I ordered an INR .  He reported that he was in the process of visiting dermatology to have a lesion removed on his back.  I  requested that they ask dermatology to send [place] a preoperative cardiac evaluation form so that our pharmacy may manage his warfarin.   He presented to the clinic 08/17/2021 for follow-up evaluation stated he felt well.  He presented with his wife retired Therapist, sports and daughter.  We did a medication reconciliation.  His blood pressure was 100/60.  He denied presyncope and syncope.  I  asked him to maintain a blood pressure log.  We will continue  his lisinopril at the current dose at this time.  He had been taking 40 mill equivalents of potassium with his torsemide.  I  continued 40 mill equivalent dosing and refill medication.  We  continued daily weights, heart healthy low-sodium diet, and maintained his physical activity.  They were planning to take a trip to [place] in the next few days.  I asked him to get up and ambulate every hour.  I  also recommended wearing lower extremity support stockings on his trip.  Follow-up was planned for 3 months and [name] in 6 months.   He presented to the clinic 11/22/21 for follow-up evaluation stated he felt well.  He did well with his trip to [place].  They stopped several times during their trip up and drove straight through on the way back.  He did note slight lower extremity edema which resolved in about a day.  His weight had remained stable.  His blood pressures been well controlled in 130s over 70s at home.  His blood pressure  the clinic was 162/72.  He took his blood pressure medication 20 minutes before his  visit .  We reviewed signs and symptoms for heart failure.  He was working with neurology and endocrinology.  He reported a fall a couple weeks ago where he had an abrasion on his head.  His wife reports that she monitored him for neuro changes.  I reviewed precautions for being evaluated in ED especially for head injury.  I  also asked him to start to carry a cane regularly as a precaution due to his unsteady gait.  He had a new Dexicom, diabetes monitor.  We reviewed his most recent lab work.  It remained stable.  We kept his follow-up with [name] in February.  He was seen in follow-up by [name] on 01/31/2022.  During that time he remained stable from a cardiac standpoint.  He had no lower extremity swelling.  He reported feeling great.  He was not exercising.  His blood pressure was 136/60 with a pulse of 69.  He presents to the clinic today for follow-up evaluation with his  wife and states he is doing well.  His wife reports that he [place]  The admission was for CHF exacerbation.  He has also had spironolactone added to his medication regimen.  His blood pressure has been better controlled.  Today in the office his blood pressure is 133/74.  We reviewed his medications I will have him take scheduled hydralazine in the afternoon and as needed for blood pressures greater than 509 systolic.  I will refill his hydralazine, spironolactone and order INR.  I will also refill Coumadin.  We will plan follow-up for 6 months.  And his wife are taking a trip to [place] to see friends.  We reviewed the importance of heart healthy low-sodium diet and fluid restriction.  They expressed understanding.   Today he denies chest pain, shortness of breath, lower extremity edema, fatigue, palpitations, melena, hematuria, hemoptysis, diaphoresis, weakness, presyncope, syncope, orthopnea, and PND.   Home Medications    Prior to Admission medications   Medication Sig Start Date End Date Taking? Authorizing Provider  aspirin EC 81 MG tablet Take 1 tablet (81 mg total) by mouth daily. 03/17/14   [name] T, PA-C  atorvastatin (LIPITOR) 40 MG tablet Take 1 tablet (40 mg total) by mouth daily. 01/31/22   [name], MD  Calcium Carbonate-Vitamin D 600-200 MG-UNIT TABS Take 1 tablet by mouth daily. For bones    [provider]  carvedilol (COREG) 12.5 MG tablet Take 1 tablet (12.5 mg total) by mouth 2 (two) times daily with a meal. 01/31/22   [name], MD  citalopram (CELEXA) 10 MG tablet Take 10 mg by mouth daily. 06/23/22   [provider]  cloNIDine (CATAPRES) 0.1 MG tablet Take by mouth. 06/23/22   [provider]  Continuous Blood Gluc Receiver (DEXCOM G6 RECEIVER) DEVI 1 Device by Does not apply route as directed. 08/13/21   Shamleffer, Melanie Crazier, MD  Continuous  Blood Gluc Sensor (DEXCOM G6 SENSOR) MISC 1 Device by Does not apply route as directed. 08/13/21   Shamleffer, Melanie Crazier, MD  Continuous Blood Gluc Transmit (DEXCOM G6 TRANSMITTER) MISC 1 Device by Does not apply route as directed. 08/13/21   Shamleffer, Melanie Crazier, MD  dapagliflozin propanediol (FARXIGA) 10 MG TABS tablet Take 1 tablet (10 mg total) by mouth daily before breakfast. 07/20/22   Shamleffer, Melanie Crazier, MD  Flaxseed Oil (LINSEED OIL) OIL  Daily, 0  Refill(s) 07/16/21   [provider]  Flaxseed, Linseed, (FLAXSEED OIL PO) Take 1 tablet by mouth daily.    [provider]  glipiZIDE (GLUCOTROL) 5 MG tablet Take 0.5 tablets (2.5 mg total) by mouth daily before breakfast. 07/20/22   Shamleffer, Melanie Crazier, MD  hydrALAZINE (APRESOLINE) 25 MG tablet Take 25 mg by mouth every 6 (six) hours. 06/23/22   [provider]  lisinopril (ZESTRIL) 40 MG tablet Take 1 tablet (40 mg total) by mouth 2 (two) times daily. 01/31/22   [name], MD  Multiple Vitamin (MULTIVITAMINS PO) Take 1 tablet by mouth daily.    [provider]  potassium chloride SA (KLOR-CON M) 20 MEQ tablet Take 2 tablets (40 mEq total) by mouth daily. 01/31/22   [name], MD  spironolactone (ALDACTONE) 25 MG tablet Take 12.5 mg by mouth daily. 06/23/22   [provider]  torsemide (DEMADEX) 20 MG tablet Take 1 tablet (20 mg total) by mouth daily. Patient taking differently: Take 30 mg by mouth daily. 01/31/22   [name], MD  warfarin (COUMADIN) 5 MG tablet Take 1 and 1/2 tablets daily by mouth or as directed by Anticoagulation Clinic. 04/07/22   [name], MD    Family History    Family History  Problem Relation Age of Onset   Cancer Mother 21       leukemia   Heart disease Father    Heart attack Father 32   Lung cancer Brother    Colon cancer Neg Hx    Rectal cancer Neg Hx    He indicated that his mother is  deceased. He indicated that his father is deceased. He indicated that his sister is deceased. He indicated that his brother is deceased. He indicated that his maternal grandmother is deceased. He indicated that his maternal grandfather is deceased. He indicated that his paternal grandmother is deceased. He indicated that his paternal grandfather is deceased. He indicated that all of his three daughters are alive. He indicated that the status of his neg hx is unknown.  Social History    Social History   Socioeconomic History   Marital status: Married    Spouse name: Not on file   Number of children: 3   Years of education: Not on file   Highest education level: Not on file  Occupational History   Occupation: Manufacturing engineer buildings  Tobacco Use   Smoking status: Never   Smokeless tobacco: Former    Types: Chew    Quit date: 02/23/2014  Vaping Use   Vaping Use: Never used  Substance and Sexual Activity   Alcohol use: No   Drug use: No   Sexual activity: Never  Other Topics Concern   Not on file  Social History Narrative   Not on file   Social Determinants of Health   Financial Resource Strain: Not on file  Food Insecurity: Not on file  Transportation Needs: Not on file  Physical Activity: Not on file  Stress: Not on file  Social Connections: Not on file  Intimate Partner Violence: Not on file     Review of Systems    General:  No chills, fever, night sweats or weight changes.  Cardiovascular:  No chest pain, dyspnea on exertion, edema, orthopnea, palpitations, paroxysmal nocturnal dyspnea. Dermatological: No rash, lesions/masses Respiratory: No cough, dyspnea Urologic: No hematuria, dysuria Abdominal:   No nausea, vomiting, diarrhea, bright red blood per rectum, melena, or hematemesis Neurologic:  No visual  changes, wkns, changes in mental status. All other systems reviewed and are otherwise negative except as noted above.  Physical Exam    VS:  BP 133/74   Pulse (!)  53   Ht _0  (1.778 m)   Wt 218 lb 12.8 oz (99.2 kg)   SpO2 95%   BMI 31.39 kg/m  , BMI Body mass index is 31.39 kg/m. GEN: Well nourished, well developed, in no acute distress. HEENT: normal. Neck: Supple, no JVD, carotid bruits, or masses. Cardiac: RRR, no murmurs, rubs, or gallops. No clubbing, cyanosis, edema.  Radials/DP/PT 2+ and equal bilaterally.  Respiratory:  Respirations regular and unlabored, clear to auscultation bilaterally. GI: Soft, nontender, nondistended, BS + x 4. MS: no deformity or atrophy. Skin: warm and dry, no rash. Neuro:  Strength and sensation are intact. Psych: Normal affect.  Accessory Clinical Findings    Recent Labs: 11/12/2021: ALT 14; BUN 33; Creatinine, Ser 1.35; Potassium 4.1; Sodium 142 04/12/2022: Hemoglobin 11.2; Platelets 167; TSH 2.260   Recent Lipid Panel    Component Value Date/Time   CHOL 101 11/12/2021 1438   CHOL 101 06/08/2016 0843   TRIG 208.0 (H) 11/12/2021 1438   HDL 37.10 (L) 11/12/2021 1438   HDL 37 (L) 06/08/2016 0843   CHOLHDL 3 11/12/2021 1438   VLDL 41.6 (H) 11/12/2021 1438   LDLCALC 38 04/13/2021 0947   LDLDIRECT 41.0 11/12/2021 1438    ECG personally reviewed by me today-sinus bradycardia with occasional premature ventricular complexes ST and T wave abnormality consider lateral ischemia 53 bpm  Echocardiogram 10/02/2019 IMPRESSIONS     1. Left ventricular ejection fraction, by visual estimation, is 50 to  55%. The left ventricle has normal function. There is moderately increased  left ventricular hypertrophy.   2. Left ventricular diastolic Doppler parameters are consistent with  impaired relaxation pattern of LV diastolic filling.   3. Global right ventricle has normal systolic function.The right  ventricular size is normal. No increase in right ventricular wall  thickness.   4. Left atrial size was normal.   5. Right atrial size was mildly dilated.   6. The mitral valve is normal in structure. No evidence  of mitral valve  regurgitation.   7. The tricuspid valve is normal in structure. Tricuspid valve  regurgitation was not visualized by color flow Doppler.   8. The aortic valve is tricuspid Aortic valve regurgitation was not  visualized by color flow Doppler. Mild to moderate aortic valve  sclerosis/calcification without any evidence of aortic stenosis.   9. The pulmonic valve was grossly normal. Pulmonic valve regurgitation is  mild by color flow Doppler.  10. The atrial septum is grossly normal.  Assessment & Plan   1.  Ischemic cardiomyopathy-Euvolemic today.  Weight today 218.8  pounds.  NYHA class II-3.  Echocardiogram 10/20 showed LVEF 50-55% and no significant valvular abnormalities.    Continue carvedilol, lisinopril, torsemide Heart healthy low-sodium diet Increase physical activity as tolerated   Coronary artery disease-denies chest pain.   Status post CABG x3 02/24/14. Continue aspirin, atorvastatin, amlodipine, carvedilol Heart healthy low-sodium diet-salty 6 given Increase physical activity as tolerated   Hyperlipidemia-LDL 38 on 04/13/2021  continue atorvastatin, aspirin, flaxseed Heart healthy low-sodium high-fiber diet Increase physical activity as tolerated    Atrial fibrillation-heart rate today 53 bpm.  Continues to report compliance with warfarin.  Denies bleeding issues. Continue carvedilol, warfarin Heart healthy low-sodium diet Increase physical activity as tolerated Refill Coumadin INR check today  Essential hypertension-BP today 133/74.  Took blood pressure medication about 20 minutes prior to visit.  Well-controlled at home. Continue  carvedilol, lisinopril, hydralazine, clonidine, spironolactone Heart healthy low-sodium diet-salty 6 reviewed Increase physical activity as tolerated   Disposition: Follow-up with  Dr.  Angelena Form or me in 6-9 months.   Jossie Ng. Vona Whiters NP-C     07/27/2022, 11:08 AM [place]  [address] Office [phone] Fax [phone]  Notice: This dictation was prepared with Dragon dictation along with smaller phrase technology. Any transcriptional errors that result from this process are unintentional and may not be corrected upon review.  I spent 14 minutes examining this patient, reviewing medications, and using patient centered shared decision making involving her cardiac care.  Prior to her visit I spent greater than 20 minutes reviewing her past medical history,  medications, and prior cardiac tests.

## 2022-07-25 ENCOUNTER — Encounter: Payer: Self-pay | Admitting: Internal Medicine

## 2022-07-27 ENCOUNTER — Ambulatory Visit (INDEPENDENT_AMBULATORY_CARE_PROVIDER_SITE_OTHER): Payer: Medicare Other | Admitting: General Practice

## 2022-07-27 ENCOUNTER — Other Ambulatory Visit: Payer: Self-pay

## 2022-07-27 ENCOUNTER — Encounter: Payer: Self-pay | Admitting: General Practice

## 2022-07-27 ENCOUNTER — Telehealth: Payer: Self-pay | Admitting: Cardiovascular Disease

## 2022-07-27 ENCOUNTER — Ambulatory Visit (INDEPENDENT_AMBULATORY_CARE_PROVIDER_SITE_OTHER): Payer: Medicare Other

## 2022-07-27 VITALS — BP 133/74 | HR 53 | Ht 70.0 in | Wt 218.8 lb

## 2022-07-27 DIAGNOSIS — I4891 Unspecified atrial fibrillation: Secondary | ICD-10-CM

## 2022-07-27 DIAGNOSIS — E782 Mixed hyperlipidemia: Secondary | ICD-10-CM | POA: Diagnosis not present

## 2022-07-27 DIAGNOSIS — I255 Ischemic cardiomyopathy: Secondary | ICD-10-CM | POA: Diagnosis not present

## 2022-07-27 DIAGNOSIS — Z8546 Personal history of malignant neoplasm of prostate: Secondary | ICD-10-CM | POA: Diagnosis not present

## 2022-07-27 DIAGNOSIS — Z5181 Encounter for therapeutic drug level monitoring: Secondary | ICD-10-CM

## 2022-07-27 DIAGNOSIS — I2510 Atherosclerotic heart disease of native coronary artery without angina pectoris: Secondary | ICD-10-CM

## 2022-07-27 DIAGNOSIS — I5022 Chronic systolic (congestive) heart failure: Secondary | ICD-10-CM | POA: Diagnosis not present

## 2022-07-27 DIAGNOSIS — R3121 Asymptomatic microscopic hematuria: Secondary | ICD-10-CM | POA: Diagnosis not present

## 2022-07-27 DIAGNOSIS — I10 Essential (primary) hypertension: Secondary | ICD-10-CM

## 2022-07-27 DIAGNOSIS — N3941 Urge incontinence: Secondary | ICD-10-CM | POA: Diagnosis not present

## 2022-07-27 DIAGNOSIS — R350 Frequency of micturition: Secondary | ICD-10-CM | POA: Diagnosis not present

## 2022-07-27 LAB — POCT INR: INR: 1.9 — AB (ref 2.0–3.0)

## 2022-07-27 MED ORDER — HYDRALAZINE HCL 25 MG PO TABS: 25.0000 mg | ORAL_TABLET | Freq: Every day | ORAL | 6 refills | Status: DC

## 2022-07-27 MED ORDER — SPIRONOLACTONE 25 MG PO TABS: 12.5000 mg | ORAL_TABLET | Freq: Every day | ORAL | 6 refills | Status: DC

## 2022-07-27 MED ORDER — WARFARIN SODIUM 5 MG PO TABS: ORAL_TABLET | ORAL | 0 refills | Status: DC

## 2022-07-27 NOTE — Telephone Encounter (Signed)
Fixed hydralazine and transferred to Coumadin to discuss Warfarin.

## 2022-07-27 NOTE — Patient Instructions (Signed)
Description   Take 2 tablets today and continue on same dosage of Warfarin 1 tablet daily EXCEPT 2 tablet on Tuesdays and Thursdays.  Stay consistent with greens each week (2-3 times per week)  Recheck INR in 3 weeks at [facility] at [facility] .  [place].

## 2022-07-27 NOTE — Telephone Encounter (Signed)
Pt c/o medication issue:  1. Name of Medication: potassium chloride SA (KLOR-CON M) 20 MEQ tablet  2. How are you currently taking this medication (dosage and times per day)?  Take 2 tablets (40 mEq total) by mouth daily.  3. Are you having a reaction (difficulty breathing--STAT)? no  4. What is your medication issue? Calling to see if patient still be taking this medication due to the new meds he has. Please advise

## 2022-07-27 NOTE — Patient Instructions (Signed)
Medication Instructions:  The current medical regimen is effective;  continue present plan and medications as directed. Please refer to the Current Medication list given to you today.   *If you need a refill on your cardiac medications before your next appointment, please call your pharmacy*  Lab Work:   Testing/Procedures:  NONE    NONE If you have labs (blood work) drawn today and your tests are completely normal, you will receive your results only by: [place] (if you have MyChart) OR  A paper copy in the mail If you have any lab test that is abnormal or we need to change your treatment, we will call you to review the results.  Special Instructions APPOINTMENT WITH COUMADIN TODAY  Follow-Up: Your next appointment:  6 month(s) In Person with Lauree Chandler, MD    lease call our office 2 months in advance to schedule this appointment   At [facility], you and your health needs are our priority.  As part of our continuing mission to provide you with exceptional heart care, we have created designated Provider Care Teams.  These Care Teams include your primary Cardiologist (physician) and Advanced Practice Providers (APPs -  Physician Assistants and Nurse Practitioners) who all work together to provide you with the care you need, when you need it.   Important Information About Sugar

## 2022-07-27 NOTE — Telephone Encounter (Signed)
Prescription refill request received for warfarin Lov: 07/27/22 Marilynn Rail)  Next INR check: 08/18/22 Warfarin tablet strength: '5mg'$   Appropriate dose and refill sent to requested pharmacy.

## 2022-07-27 NOTE — Telephone Encounter (Signed)
The only change I can see is the addition of hydralazine today.  Will route to [place] for any clarification on potassium as the patient was seen by him today.

## 2022-08-18 DIAGNOSIS — I5020 Unspecified systolic (congestive) heart failure: Secondary | ICD-10-CM | POA: Diagnosis not present

## 2022-08-18 DIAGNOSIS — N183 Chronic kidney disease, stage 3 unspecified: Secondary | ICD-10-CM | POA: Diagnosis not present

## 2022-08-18 DIAGNOSIS — Z1389 Encounter for screening for other disorder: Secondary | ICD-10-CM | POA: Diagnosis not present

## 2022-08-18 DIAGNOSIS — Z7901 Long term (current) use of anticoagulants: Secondary | ICD-10-CM | POA: Diagnosis not present

## 2022-08-18 DIAGNOSIS — D649 Anemia, unspecified: Secondary | ICD-10-CM | POA: Diagnosis not present

## 2022-08-18 DIAGNOSIS — E1165 Type 2 diabetes mellitus with hyperglycemia: Secondary | ICD-10-CM | POA: Diagnosis not present

## 2022-09-22 DIAGNOSIS — I4891 Unspecified atrial fibrillation: Secondary | ICD-10-CM | POA: Diagnosis not present

## 2022-10-18 ENCOUNTER — Telehealth: Payer: Self-pay | Admitting: Internal Medicine

## 2022-10-18 NOTE — Telephone Encounter (Signed)
AZ&ME application printed and dropped in outgoing mail to address on file.

## 2022-10-18 NOTE — Telephone Encounter (Signed)
Patient's wife is calling to get Patient Assistance forms mailed to her for [place].

## 2022-10-27 DIAGNOSIS — Z23 Encounter for immunization: Secondary | ICD-10-CM | POA: Diagnosis not present

## 2022-11-07 ENCOUNTER — Ambulatory Visit: Payer: Medicare Other | Attending: Cardiology | Admitting: *Deleted

## 2022-11-07 DIAGNOSIS — I4891 Unspecified atrial fibrillation: Secondary | ICD-10-CM

## 2022-11-07 DIAGNOSIS — Z5181 Encounter for therapeutic drug level monitoring: Secondary | ICD-10-CM

## 2022-11-07 LAB — POCT INR: INR: 1.9 — AB (ref 2.0–3.0)

## 2022-11-07 NOTE — Patient Instructions (Addendum)
Description   Take 2 tablets today and then continue taking Warfarin 1 tablet daily EXCEPT 2 tablet on Tuesdays and Thursdays. Stay consistent with greens each week (2-3 times per week). Recheck INR in 4 weeks. Coumadin Clinic [phone].

## 2022-12-01 ENCOUNTER — Ambulatory Visit: Payer: Medicare Other | Admitting: Internal Medicine

## 2022-12-01 DIAGNOSIS — Z23 Encounter for immunization: Secondary | ICD-10-CM | POA: Diagnosis not present

## 2022-12-08 ENCOUNTER — Ambulatory Visit: Payer: Medicare Other | Attending: Cardiovascular Disease | Admitting: *Deleted

## 2022-12-08 DIAGNOSIS — Z5181 Encounter for therapeutic drug level monitoring: Secondary | ICD-10-CM | POA: Diagnosis not present

## 2022-12-08 DIAGNOSIS — I4891 Unspecified atrial fibrillation: Secondary | ICD-10-CM

## 2022-12-08 LAB — POCT INR: INR: 2 (ref 2.0–3.0)

## 2022-12-08 NOTE — Patient Instructions (Addendum)
Description   Start taking Warfarin 1 tablet daily EXCEPT 2 tablet on Sundays, Tuesdays and Thursdays. Stay consistent with greens each week (2-3 times per week). Recheck INR in 4 weeks. Coumadin Clinic [phone].

## 2022-12-14 ENCOUNTER — Emergency Department (HOSPITAL_BASED_OUTPATIENT_CLINIC_OR_DEPARTMENT_OTHER): Payer: Medicare Other

## 2022-12-14 ENCOUNTER — Inpatient Hospital Stay (HOSPITAL_BASED_OUTPATIENT_CLINIC_OR_DEPARTMENT_OTHER)
Admission: EM | Admit: 2022-12-14 | Discharge: 2022-12-18 | DRG: 617 | Disposition: A | Discharge status: Home / Self Care | Payer: Medicare Other | Attending: Internal Medicine | Admitting: Internal Medicine

## 2022-12-14 ENCOUNTER — Other Ambulatory Visit: Payer: Self-pay

## 2022-12-14 ENCOUNTER — Encounter (HOSPITAL_COMMUNITY): Payer: Self-pay

## 2022-12-14 ENCOUNTER — Encounter (HOSPITAL_BASED_OUTPATIENT_CLINIC_OR_DEPARTMENT_OTHER): Payer: Self-pay | Admitting: Emergency Medicine

## 2022-12-14 DIAGNOSIS — F329 Major depressive disorder, single episode, unspecified: Secondary | ICD-10-CM | POA: Diagnosis present

## 2022-12-14 DIAGNOSIS — I480 Paroxysmal atrial fibrillation: Secondary | ICD-10-CM | POA: Diagnosis present

## 2022-12-14 DIAGNOSIS — R600 Localized edema: Secondary | ICD-10-CM | POA: Diagnosis not present

## 2022-12-14 DIAGNOSIS — D696 Thrombocytopenia, unspecified: Secondary | ICD-10-CM | POA: Diagnosis present

## 2022-12-14 DIAGNOSIS — R262 Difficulty in walking, not elsewhere classified: Secondary | ICD-10-CM | POA: Diagnosis present

## 2022-12-14 DIAGNOSIS — E1152 Type 2 diabetes mellitus with diabetic peripheral angiopathy with gangrene: Secondary | ICD-10-CM | POA: Diagnosis present

## 2022-12-14 DIAGNOSIS — L97519 Non-pressure chronic ulcer of other part of right foot with unspecified severity: Secondary | ICD-10-CM | POA: Diagnosis not present

## 2022-12-14 DIAGNOSIS — N179 Acute kidney failure, unspecified: Secondary | ICD-10-CM | POA: Diagnosis present

## 2022-12-14 DIAGNOSIS — L97526 Non-pressure chronic ulcer of other part of left foot with bone involvement without evidence of necrosis: Secondary | ICD-10-CM | POA: Diagnosis present

## 2022-12-14 DIAGNOSIS — I129 Hypertensive chronic kidney disease with stage 1 through stage 4 chronic kidney disease, or unspecified chronic kidney disease: Secondary | ICD-10-CM | POA: Diagnosis not present

## 2022-12-14 DIAGNOSIS — I5042 Chronic combined systolic (congestive) and diastolic (congestive) heart failure: Secondary | ICD-10-CM | POA: Diagnosis present

## 2022-12-14 DIAGNOSIS — M86479 Chronic osteomyelitis with draining sinus, unspecified ankle and foot: Secondary | ICD-10-CM | POA: Diagnosis not present

## 2022-12-14 DIAGNOSIS — L03116 Cellulitis of left lower limb: Secondary | ICD-10-CM | POA: Diagnosis present

## 2022-12-14 DIAGNOSIS — L03115 Cellulitis of right lower limb: Secondary | ICD-10-CM | POA: Diagnosis present

## 2022-12-14 DIAGNOSIS — E1169 Type 2 diabetes mellitus with other specified complication: Secondary | ICD-10-CM | POA: Diagnosis not present

## 2022-12-14 DIAGNOSIS — Z8546 Personal history of malignant neoplasm of prostate: Secondary | ICD-10-CM

## 2022-12-14 DIAGNOSIS — L97516 Non-pressure chronic ulcer of other part of right foot with bone involvement without evidence of necrosis: Secondary | ICD-10-CM | POA: Diagnosis present

## 2022-12-14 DIAGNOSIS — I10 Essential (primary) hypertension: Secondary | ICD-10-CM | POA: Diagnosis not present

## 2022-12-14 DIAGNOSIS — Z87891 Personal history of nicotine dependence: Secondary | ICD-10-CM

## 2022-12-14 DIAGNOSIS — I25708 Atherosclerosis of coronary artery bypass graft(s), unspecified, with other forms of angina pectoris: Secondary | ICD-10-CM | POA: Diagnosis present

## 2022-12-14 DIAGNOSIS — E11628 Type 2 diabetes mellitus with other skin complications: Secondary | ICD-10-CM | POA: Diagnosis present

## 2022-12-14 DIAGNOSIS — I130 Hypertensive heart and chronic kidney disease with heart failure and stage 1 through stage 4 chronic kidney disease, or unspecified chronic kidney disease: Secondary | ICD-10-CM | POA: Diagnosis present

## 2022-12-14 DIAGNOSIS — N183 Chronic kidney disease, stage 3 unspecified: Secondary | ICD-10-CM | POA: Diagnosis present

## 2022-12-14 DIAGNOSIS — Z8249 Family history of ischemic heart disease and other diseases of the circulatory system: Secondary | ICD-10-CM

## 2022-12-14 DIAGNOSIS — I70263 Atherosclerosis of native arteries of extremities with gangrene, bilateral legs: Secondary | ICD-10-CM | POA: Diagnosis present

## 2022-12-14 DIAGNOSIS — I96 Gangrene, not elsewhere classified: Secondary | ICD-10-CM | POA: Diagnosis not present

## 2022-12-14 DIAGNOSIS — Z87442 Personal history of urinary calculi: Secondary | ICD-10-CM

## 2022-12-14 DIAGNOSIS — E11621 Type 2 diabetes mellitus with foot ulcer: Secondary | ICD-10-CM | POA: Diagnosis present

## 2022-12-14 DIAGNOSIS — L97529 Non-pressure chronic ulcer of other part of left foot with unspecified severity: Secondary | ICD-10-CM | POA: Diagnosis not present

## 2022-12-14 DIAGNOSIS — M86172 Other acute osteomyelitis, left ankle and foot: Secondary | ICD-10-CM | POA: Diagnosis not present

## 2022-12-14 DIAGNOSIS — M19071 Primary osteoarthritis, right ankle and foot: Secondary | ICD-10-CM | POA: Diagnosis not present

## 2022-12-14 DIAGNOSIS — E785 Hyperlipidemia, unspecified: Secondary | ICD-10-CM | POA: Diagnosis present

## 2022-12-14 DIAGNOSIS — M86672 Other chronic osteomyelitis, left ankle and foot: Secondary | ICD-10-CM | POA: Diagnosis not present

## 2022-12-14 DIAGNOSIS — E1143 Type 2 diabetes mellitus with diabetic autonomic (poly)neuropathy: Secondary | ICD-10-CM | POA: Diagnosis present

## 2022-12-14 DIAGNOSIS — L089 Local infection of the skin and subcutaneous tissue, unspecified: Secondary | ICD-10-CM | POA: Diagnosis not present

## 2022-12-14 DIAGNOSIS — Z7984 Long term (current) use of oral hypoglycemic drugs: Secondary | ICD-10-CM | POA: Diagnosis not present

## 2022-12-14 DIAGNOSIS — M86179 Other acute osteomyelitis, unspecified ankle and foot: Secondary | ICD-10-CM

## 2022-12-14 DIAGNOSIS — L97509 Non-pressure chronic ulcer of other part of unspecified foot with unspecified severity: Secondary | ICD-10-CM | POA: Diagnosis not present

## 2022-12-14 DIAGNOSIS — I2510 Atherosclerotic heart disease of native coronary artery without angina pectoris: Secondary | ICD-10-CM | POA: Diagnosis present

## 2022-12-14 DIAGNOSIS — L0390 Cellulitis, unspecified: Secondary | ICD-10-CM | POA: Diagnosis not present

## 2022-12-14 DIAGNOSIS — I4891 Unspecified atrial fibrillation: Secondary | ICD-10-CM | POA: Diagnosis not present

## 2022-12-14 DIAGNOSIS — M86171 Other acute osteomyelitis, right ankle and foot: Secondary | ICD-10-CM | POA: Diagnosis present

## 2022-12-14 DIAGNOSIS — E1122 Type 2 diabetes mellitus with diabetic chronic kidney disease: Secondary | ICD-10-CM | POA: Diagnosis not present

## 2022-12-14 DIAGNOSIS — S91302A Unspecified open wound, left foot, initial encounter: Secondary | ICD-10-CM | POA: Diagnosis not present

## 2022-12-14 DIAGNOSIS — S91301A Unspecified open wound, right foot, initial encounter: Secondary | ICD-10-CM | POA: Diagnosis not present

## 2022-12-14 DIAGNOSIS — Z806 Family history of leukemia: Secondary | ICD-10-CM

## 2022-12-14 DIAGNOSIS — R531 Weakness: Secondary | ICD-10-CM | POA: Diagnosis present

## 2022-12-14 DIAGNOSIS — Z794 Long term (current) use of insulin: Secondary | ICD-10-CM

## 2022-12-14 DIAGNOSIS — Z79899 Other long term (current) drug therapy: Secondary | ICD-10-CM

## 2022-12-14 DIAGNOSIS — M8618 Other acute osteomyelitis, other site: Secondary | ICD-10-CM | POA: Diagnosis not present

## 2022-12-14 DIAGNOSIS — I255 Ischemic cardiomyopathy: Secondary | ICD-10-CM | POA: Diagnosis present

## 2022-12-14 DIAGNOSIS — M86472 Chronic osteomyelitis with draining sinus, left ankle and foot: Secondary | ICD-10-CM | POA: Diagnosis not present

## 2022-12-14 DIAGNOSIS — R300 Dysuria: Secondary | ICD-10-CM | POA: Diagnosis present

## 2022-12-14 DIAGNOSIS — I5022 Chronic systolic (congestive) heart failure: Secondary | ICD-10-CM | POA: Diagnosis present

## 2022-12-14 DIAGNOSIS — Z801 Family history of malignant neoplasm of trachea, bronchus and lung: Secondary | ICD-10-CM

## 2022-12-14 DIAGNOSIS — N189 Chronic kidney disease, unspecified: Secondary | ICD-10-CM | POA: Diagnosis present

## 2022-12-14 DIAGNOSIS — N281 Cyst of kidney, acquired: Secondary | ICD-10-CM | POA: Diagnosis not present

## 2022-12-14 DIAGNOSIS — M86471 Chronic osteomyelitis with draining sinus, right ankle and foot: Secondary | ICD-10-CM | POA: Diagnosis not present

## 2022-12-14 DIAGNOSIS — Z951 Presence of aortocoronary bypass graft: Secondary | ICD-10-CM

## 2022-12-14 DIAGNOSIS — L03032 Cellulitis of left toe: Secondary | ICD-10-CM | POA: Diagnosis not present

## 2022-12-14 DIAGNOSIS — M869 Osteomyelitis, unspecified: Secondary | ICD-10-CM | POA: Diagnosis not present

## 2022-12-14 DIAGNOSIS — L03031 Cellulitis of right toe: Secondary | ICD-10-CM | POA: Diagnosis not present

## 2022-12-14 DIAGNOSIS — Z7901 Long term (current) use of anticoagulants: Secondary | ICD-10-CM

## 2022-12-14 DIAGNOSIS — E119 Type 2 diabetes mellitus without complications: Secondary | ICD-10-CM

## 2022-12-14 DIAGNOSIS — D631 Anemia in chronic kidney disease: Secondary | ICD-10-CM | POA: Diagnosis not present

## 2022-12-14 DIAGNOSIS — Z7982 Long term (current) use of aspirin: Secondary | ICD-10-CM

## 2022-12-14 DIAGNOSIS — M86671 Other chronic osteomyelitis, right ankle and foot: Secondary | ICD-10-CM | POA: Diagnosis not present

## 2022-12-14 LAB — CBC WITH DIFFERENTIAL/PLATELET
Abs Immature Granulocytes: 0.05 10*3/uL (ref 0.00–0.07)
Basophils Absolute: 0 10*3/uL (ref 0.0–0.1)
Basophils Relative: 0 %
Eosinophils Absolute: 0 10*3/uL (ref 0.0–0.5)
Eosinophils Relative: 0 %
HCT: 35.4 % — ABNORMAL LOW (ref 39.0–52.0)
Hemoglobin: 11.6 g/dL — ABNORMAL LOW (ref 13.0–17.0)
Immature Granulocytes: 0 %
Lymphocytes Relative: 10 %
Lymphs Abs: 1.3 10*3/uL (ref 0.7–4.0)
MCH: 28.6 pg (ref 26.0–34.0)
MCHC: 32.8 g/dL (ref 30.0–36.0)
MCV: 87.4 fL (ref 80.0–100.0)
Monocytes Absolute: 1.4 10*3/uL — ABNORMAL HIGH (ref 0.1–1.0)
Monocytes Relative: 11 %
Neutro Abs: 10.4 10*3/uL — ABNORMAL HIGH (ref 1.7–7.7)
Neutrophils Relative %: 79 %
Platelets: 176 10*3/uL (ref 150–400)
RBC: 4.05 MIL/uL — ABNORMAL LOW (ref 4.22–5.81)
RDW: 13.1 % (ref 11.5–15.5)
WBC: 13.3 10*3/uL — ABNORMAL HIGH (ref 4.0–10.5)
nRBC: 0 % (ref 0.0–0.2)

## 2022-12-14 LAB — COMPREHENSIVE METABOLIC PANEL
ALT: 21 U/L (ref 0–44)
AST: 29 U/L (ref 15–41)
Albumin: 3.2 g/dL — ABNORMAL LOW (ref 3.5–5.0)
Alkaline Phosphatase: 61 U/L (ref 38–126)
Anion gap: 13 (ref 5–15)
BUN: 49 mg/dL — ABNORMAL HIGH (ref 8–23)
CO2: 21 mmol/L — ABNORMAL LOW (ref 22–32)
Calcium: 8.9 mg/dL (ref 8.9–10.3)
Chloride: 99 mmol/L (ref 98–111)
Creatinine, Ser: 2.35 mg/dL — ABNORMAL HIGH (ref 0.61–1.24)
GFR, Estimated: 27 mL/min — ABNORMAL LOW (ref >60)
Glucose, Bld: 210 mg/dL — ABNORMAL HIGH (ref 70–99)
Potassium: 4.5 mmol/L (ref 3.5–5.1)
Sodium: 133 mmol/L — ABNORMAL LOW (ref 135–145)
Total Bilirubin: 0.8 mg/dL (ref 0.3–1.2)
Total Protein: 8.1 g/dL (ref 6.5–8.1)

## 2022-12-14 LAB — PROTIME-INR
INR: 2.3 — ABNORMAL HIGH (ref 0.8–1.2)
Prothrombin Time: 25 seconds — ABNORMAL HIGH (ref 11.4–15.2)

## 2022-12-14 LAB — CBG MONITORING, ED: Glucose-Capillary: 201 mg/dL — ABNORMAL HIGH (ref 70–99)

## 2022-12-14 MED ORDER — LACTATED RINGERS IV BOLUS
1000.0000 mL | Freq: Once | INTRAVENOUS | Status: AC
  Administered 2022-12-14: 1000 mL via INTRAVENOUS

## 2022-12-14 MED ORDER — VANCOMYCIN HCL IN DEXTROSE 1-5 GM/200ML-% IV SOLN
1000.0000 mg | Freq: Once | INTRAVENOUS | Status: AC
  Administered 2022-12-14: 1000 mg via INTRAVENOUS
  Filled 2022-12-14: qty 200

## 2022-12-14 MED ORDER — VANCOMYCIN HCL 2000 MG/400ML IV SOLN: 2000.0000 mg | Freq: Once | INTRAVENOUS | Status: DC

## 2022-12-14 NOTE — ED Triage Notes (Addendum)
Dtg reports he has been lethargic and slid out of chair and was on the floor x 4 hrs. Weakness x 1 week Unsteady gait Also reports both great toes are "black"  Both toes are dressed will assess when in room BS 201

## 2022-12-14 NOTE — Progress Notes (Signed)
Pharmacy Antibiotic Note  Seth [name] is a 81 y.o. male admitted on 12/14/2022 with  osteomyelitis .  Pharmacy has been consulted for cefepime dosing.  Plan: Cefepime 2g Q12H as renal function is borderline.  IV vancomycin '2000mg'$  per MD.  Follow culture data for de-escalation.  Monitor renal function for dose adjustments as indicated.   Height: '5\' 10"'$  (177.8 cm) Weight: 97.5 kg (215 lb) IBW/kg (Calculated) : 73  Temp (24hrs), Avg:98 F (36.7 C), Min:98 F (36.7 C), Max:98 F (36.7 C)  Recent Labs  Lab 12/14/22 2033  WBC 13.3*  CREATININE 2.35*    Estimated Creatinine Clearance: 29.4 mL/min (A) (by C-G formula based on SCr of 2.35 mg/dL (H)).    No Known Allergies  Thank you for allowing pharmacy to be a part of this patient's care.  Esmeralda Arthur, PharmD, BCCCP  12/14/2022 9:59 PM

## 2022-12-14 NOTE — ED Notes (Signed)
Per RN Shaun ok for pt to have Diet sprite

## 2022-12-14 NOTE — ED Provider Notes (Signed)
[facility] Provider Note   CSN: [id number] Arrival date & time: 12/14/22  1954     History  Chief Complaint  Patient presents with   Weakness    Seth [name] is a 81 y.o. male.  Patient is an 81 year old male with a past medical history of hypertension, diabetes, CHF, A-fib on Coumadin presenting to the emergency department for generalized weakness.  Patient is here with his wife who states that he initially felt unwell last week but denied any specific symptoms and over the last few days has become increasingly weak.  She states that he has been having trouble getting out of his chair and slid out of his chair earlier today.  He denies hitting his head or losing consciousness.  Patient denies any fevers, chest or abdominal pain or shortness of breath, numbness or tingling.  He states he feels generally weak all over.  He denies any nausea but does report decreased appetite.  His wife also notes that she noted 2 new wounds to his bilateral toes today that she did dress fluids concerned that they could be infected and they have been red and swollen.  The patient also reported occasional dysuria.  The history is provided by the patient and the spouse.  Weakness      Home Medications Prior to Admission medications   Medication Sig Start Date End Date Taking? Authorizing Provider  aspirin EC 81 MG tablet Take 1 tablet (81 mg total) by mouth daily. 03/17/14   [name] T, PA-C  atorvastatin (LIPITOR) 40 MG tablet Take 1 tablet (40 mg total) by mouth daily. 01/31/22   [name], MD  Calcium Carbonate-Vitamin D 600-200 MG-UNIT TABS Take 1 tablet by mouth daily. For bones    [provider]  carvedilol (COREG) 12.5 MG tablet Take 1 tablet (12.5 mg total) by mouth 2 (two) times daily with a meal. 01/31/22   [name], MD  citalopram (CELEXA) 10 MG tablet Take 10 mg by mouth daily. 06/23/22   [provider]   cloNIDine (CATAPRES) 0.1 MG tablet Take by mouth. 06/23/22   [provider]  Continuous Blood Gluc Receiver (DEXCOM G6 RECEIVER) DEVI 1 Device by Does not apply route as directed. 08/13/21   Shamleffer, Melanie Crazier, MD  Continuous Blood Gluc Sensor (DEXCOM G6 SENSOR) MISC 1 Device by Does not apply route as directed. 08/13/21   Shamleffer, Melanie Crazier, MD  Continuous Blood Gluc Transmit (DEXCOM G6 TRANSMITTER) MISC 1 Device by Does not apply route as directed. 08/13/21   Shamleffer, Melanie Crazier, MD  dapagliflozin propanediol (FARXIGA) 10 MG TABS tablet Take 1 tablet (10 mg total) by mouth daily before breakfast. 07/20/22   Shamleffer, Melanie Crazier, MD  Flaxseed, Linseed, (FLAXSEED OIL PO) Take 1 tablet by mouth daily.    [provider]  glipiZIDE (GLUCOTROL) 5 MG tablet Take 0.5 tablets (2.5 mg total) by mouth daily before breakfast. 07/20/22   Shamleffer, Melanie Crazier, MD  hydrALAZINE (APRESOLINE) 25 MG tablet Take 1 tablet (25 mg total) by mouth daily in the afternoon. TAKE '25MG'$  AT 3PM HOLD IF SBP <140 MAY TAKE ADDITIONAL IF SBP >160 07/27/22   Deberah Pelton, NP  lisinopril (ZESTRIL) 40 MG tablet Take 1 tablet (40 mg total) by mouth 2 (two) times daily. 01/31/22   [name], MD  Multiple Vitamin (MULTIVITAMINS PO) Take 1 tablet by mouth daily.    [provider]  potassium chloride SA (KLOR-CON M)  20 MEQ tablet Take 2 tablets (40 mEq total) by mouth daily. 01/31/22   [name], MD  spironolactone (ALDACTONE) 25 MG tablet Take 0.5 tablets (12.5 mg total) by mouth daily. 07/27/22   Deberah Pelton, NP  torsemide (DEMADEX) 20 MG tablet Take 1 tablet (20 mg total) by mouth daily. Patient taking differently: Take 30 mg by mouth daily. 01/31/22   [name], MD  warfarin (COUMADIN) 5 MG tablet Take 1 to 2 tablets by mouth daily as directed by Anticoagulation Clinic. 07/27/22   [name], MD      Allergies     Patient has no known allergies.    Review of Systems   Review of Systems  Neurological:  Positive for weakness.    Physical Exam Updated Vital Signs BP (!) 140/63   Pulse 67   Temp 98 F (36.7 C) (Oral)   Resp 20   Ht '5\' 10"'$  (1.778 m)   Wt 97.5 kg   SpO2 98%   BMI 30.85 kg/m  Physical Exam Vitals and nursing note reviewed.  Constitutional:      General: He is not in acute distress.    Appearance: Normal appearance.  HENT:     Head: Normocephalic and atraumatic.     Nose: Nose normal.     Mouth/Throat:     Mouth: Mucous membranes are moist.     Pharynx: Oropharynx is clear.  Eyes:     Extraocular Movements: Extraocular movements intact.     Conjunctiva/sclera: Conjunctivae normal.     Pupils: Pupils are equal, round, and reactive to light.  Cardiovascular:     Rate and Rhythm: Normal rate and regular rhythm.     Pulses: Normal pulses.     Heart sounds: Normal heart sounds.  Pulmonary:     Effort: Pulmonary effort is normal.     Breath sounds: Normal breath sounds.  Abdominal:     General: Abdomen is flat.     Palpations: Abdomen is soft.     Tenderness: There is no abdominal tenderness.  Musculoskeletal:        General: Normal range of motion.     Cervical back: Normal range of motion and neck supple.     Right lower leg: No edema.     Left lower leg: No edema.  Skin:    General: Skin is warm and dry.     Comments: ~ 2 cm ulceration to R first toe with surrounding erythema and warmth, small amount of purulent drainage ~1 cm ulceration to L first toe with surrounding erythema and warmth, small amount of purulent drainage   Neurological:     General: No focal deficit present.     Mental Status: He is alert and oriented to person, place, and time.     Cranial Nerves: No cranial nerve deficit.     Sensory: No sensory deficit.     Motor: No weakness.     Coordination: Coordination normal.  Psychiatric:        Mood and Affect: Mood normal.        Behavior:  Behavior normal.        ED Results / Procedures / Treatments   Labs (all labs ordered are listed, but only abnormal results are displayed) Labs Reviewed  CBC WITH DIFFERENTIAL/PLATELET - Abnormal; Notable for the following components:      Result Value   WBC 13.3 (*)    RBC 4.05 (*)    Hemoglobin 11.6 (*)  HCT 35.4 (*)    Neutro Abs 10.4 (*)    Monocytes Absolute 1.4 (*)    All other components within normal limits  COMPREHENSIVE METABOLIC PANEL - Abnormal; Notable for the following components:   Sodium 133 (*)    CO2 21 (*)    Glucose, Bld 210 (*)    BUN 49 (*)    Creatinine, Ser 2.35 (*)    Albumin 3.2 (*)    GFR, Estimated 27 (*)    All other components within normal limits  PROTIME-INR - Abnormal; Notable for the following components:   Prothrombin Time 25.0 (*)    INR 2.3 (*)    All other components within normal limits  CBG MONITORING, ED - Abnormal; Notable for the following components:   Glucose-Capillary 201 (*)    All other components within normal limits  URINALYSIS, ROUTINE W REFLEX MICROSCOPIC    EKG EKG Interpretation  Date/Time:  Wednesday December 14 2022 20:58:32 EST Ventricular Rate:  73 PR Interval:  178 QRS Duration: 109 QT Interval:  430 QTC Calculation: 474 R Axis:   -43 Text Interpretation: Sinus rhythm Incomplete RBBB and LAFB Anteroseptal infarct, age indeterminate Lateral leads are also involved Baseline wander Now in sinus rhythm compared to prior EKG Confirmed by Leanord Asal ([id number]) on 12/14/2022 9:25:52 PM  Radiology DG Foot Complete Left  Result Date: 12/14/2022 CLINICAL DATA:  Wound EXAM: LEFT FOOT - COMPLETE 3+ VIEW COMPARISON:  None Available. FINDINGS: No fracture or malalignment. Vascular calcifications. Ulcer distal great toe medial and plantar surface. No emphysema. Possible mild resorptive change at the base of first distal phalanx. Small erosion volar aspect of the proximal phalanx. Arthritis at the first MTP joint.  Mild resorption tuft of second distal phalanx. Possible small erosions at the middle phalanx of the fifth digit. IMPRESSION: 1. Ulcer distal great toe with possible mild resorptive change at the base of the first distal phalanx, possible osteomyelitis. Small erosion volar aspect of the proximal phalanx either inflammatory or infectious. 2. Possible small erosions at the middle phalanx of the fifth digit and tuft of second distal phalanx. Correlate for signs of active infection at these digits. Electronically Signed   By: Donavan Foil M.D.   On: 12/14/2022 21:33   DG Foot Complete Right  Result Date: 12/14/2022 CLINICAL DATA:  Wound EXAM: RIGHT FOOT COMPLETE - 3+ VIEW COMPARISON:  06/10/2016 FINDINGS: No fracture or malalignment. Moderate arthritis at the first MTP joint. Vascular calcifications. Ulcer at the distal great toe on the medial side. There is slight erosive change at the base of the first distal phalanx. IMPRESSION: 1. Soft tissue ulceration at the distal great toe with slight erosive change at the base of the first distal phalanx, suspicious for osteomyelitis. 2. Moderate arthritis at the first MTP joint. Electronically Signed   By: Donavan Foil M.D.   On: 12/14/2022 21:30    Procedures Procedures    Medications Ordered in ED Medications  vancomycin (VANCOCIN) IVPB 1000 mg/200 mL premix (has no administration in time range)    And  vancomycin (VANCOCIN) IVPB 1000 mg/200 mL premix (1,000 mg Intravenous New Bag/Given 12/14/22 2244)  lactated ringers bolus 1,000 mL (1,000 mLs Intravenous New Bag/Given 12/14/22 2242)    ED Course/ Medical Decision Making/ A&P Clinical Course as of 12/14/22 2312  Wed Dec 14, 2022  2152 Suspicion of osteomyelitis of bilateral great toes and will be started on antibiotics. Patient additionally has an AKI with Cr 2.3 from baseline 1.3. He will  require admission for further fluid resuscitation. UA pending at this time. [VK]    Clinical Course User  Index [VK] Kemper Durie, DO                           Medical Decision Making This patient presents to the ED with chief complaint(s) of generalized weakness with pertinent past medical history of HTN, DM, CHF, A fib on coumadin which further complicates the presenting complaint. The complaint involves an extensive differential diagnosis and also carries with it a high risk of complications and morbidity.    The differential diagnosis includes ACS, arrhythmia, anemia, electrolyte abnormality, dehydration, infection, sepsis, cellulitis, osteomyelitis, UTI patient has no focal neurologic deficits making stroke unlikely  Additional history obtained: Additional history obtained from spouse Records reviewed Primary Care Documents  ED Course and Reassessment: Patient was awake and alert without any focal neurologic deficits on arrival making CVA unlikely.  He will undergo labs to evaluate for dehydration or electrolyte abnormality or anemia.  EKG on arrival showed normal sinus rhythm without any ischemic changes.  He will have urine performed to evaluate for UTI and x-rays of bilateral feet to evaluate for possible osteomyelitis or deep space infection.  He does have concern of cellulitis of bilateral first toes surrounding his wounds.  Independent labs interpretation:  The following labs were independently interpreted: AKI with creatinine of 2.3 from baseline of 1.3, mild leukocytosis  Independent visualization of imaging: - I independently visualized the following imaging with scope of interpretation limited to determining acute life threatening conditions related to emergency care: Bilateral foot x-rays, which revealed concern for osteomyelitis of bilateral first toes  Consultation: - Consulted or discussed management/test interpretation w/ external professional: Hospitalists  Consideration for admission or further workup: Patient requires admission for further workup and management of  his AKI and osteomyelitis Social Determinants of health: N/A    Amount and/or Complexity of Data Reviewed Labs: ordered. Radiology: ordered.  Risk Prescription drug management. Decision regarding hospitalization.          Final Clinical Impression(s) / ED Diagnoses Final diagnoses:  AKI (acute kidney injury) ([place])  Other acute osteomyelitis of foot, unspecified laterality [facility])    Rx / DC Orders ED Discharge Orders     None         Kemper Durie, DO 12/14/22 2312

## 2022-12-15 ENCOUNTER — Encounter (HOSPITAL_COMMUNITY): Payer: Self-pay | Admitting: Internal Medicine

## 2022-12-15 ENCOUNTER — Inpatient Hospital Stay (HOSPITAL_COMMUNITY): Payer: Medicare Other

## 2022-12-15 ENCOUNTER — Other Ambulatory Visit: Payer: Self-pay

## 2022-12-15 DIAGNOSIS — M86672 Other chronic osteomyelitis, left ankle and foot: Secondary | ICD-10-CM | POA: Diagnosis not present

## 2022-12-15 DIAGNOSIS — I70263 Atherosclerosis of native arteries of extremities with gangrene, bilateral legs: Secondary | ICD-10-CM | POA: Diagnosis present

## 2022-12-15 DIAGNOSIS — Z7984 Long term (current) use of oral hypoglycemic drugs: Secondary | ICD-10-CM | POA: Diagnosis not present

## 2022-12-15 DIAGNOSIS — N281 Cyst of kidney, acquired: Secondary | ICD-10-CM | POA: Diagnosis not present

## 2022-12-15 DIAGNOSIS — N189 Chronic kidney disease, unspecified: Secondary | ICD-10-CM | POA: Diagnosis present

## 2022-12-15 DIAGNOSIS — Z87891 Personal history of nicotine dependence: Secondary | ICD-10-CM | POA: Diagnosis not present

## 2022-12-15 DIAGNOSIS — Z79899 Other long term (current) drug therapy: Secondary | ICD-10-CM | POA: Diagnosis not present

## 2022-12-15 DIAGNOSIS — D696 Thrombocytopenia, unspecified: Secondary | ICD-10-CM | POA: Diagnosis present

## 2022-12-15 DIAGNOSIS — I96 Gangrene, not elsewhere classified: Secondary | ICD-10-CM | POA: Diagnosis not present

## 2022-12-15 DIAGNOSIS — E1152 Type 2 diabetes mellitus with diabetic peripheral angiopathy with gangrene: Secondary | ICD-10-CM | POA: Diagnosis present

## 2022-12-15 DIAGNOSIS — L97529 Non-pressure chronic ulcer of other part of left foot with unspecified severity: Secondary | ICD-10-CM | POA: Diagnosis not present

## 2022-12-15 DIAGNOSIS — I2510 Atherosclerotic heart disease of native coronary artery without angina pectoris: Secondary | ICD-10-CM | POA: Diagnosis not present

## 2022-12-15 DIAGNOSIS — M86479 Chronic osteomyelitis with draining sinus, unspecified ankle and foot: Secondary | ICD-10-CM | POA: Diagnosis not present

## 2022-12-15 DIAGNOSIS — L03032 Cellulitis of left toe: Secondary | ICD-10-CM | POA: Diagnosis not present

## 2022-12-15 DIAGNOSIS — L97526 Non-pressure chronic ulcer of other part of left foot with bone involvement without evidence of necrosis: Secondary | ICD-10-CM | POA: Diagnosis present

## 2022-12-15 DIAGNOSIS — E1122 Type 2 diabetes mellitus with diabetic chronic kidney disease: Secondary | ICD-10-CM | POA: Diagnosis present

## 2022-12-15 DIAGNOSIS — M8618 Other acute osteomyelitis, other site: Secondary | ICD-10-CM | POA: Diagnosis not present

## 2022-12-15 DIAGNOSIS — M869 Osteomyelitis, unspecified: Secondary | ICD-10-CM | POA: Diagnosis present

## 2022-12-15 DIAGNOSIS — L03116 Cellulitis of left lower limb: Secondary | ICD-10-CM | POA: Diagnosis present

## 2022-12-15 DIAGNOSIS — L0390 Cellulitis, unspecified: Secondary | ICD-10-CM | POA: Diagnosis not present

## 2022-12-15 DIAGNOSIS — I129 Hypertensive chronic kidney disease with stage 1 through stage 4 chronic kidney disease, or unspecified chronic kidney disease: Secondary | ICD-10-CM | POA: Diagnosis not present

## 2022-12-15 DIAGNOSIS — E785 Hyperlipidemia, unspecified: Secondary | ICD-10-CM | POA: Diagnosis present

## 2022-12-15 DIAGNOSIS — R300 Dysuria: Secondary | ICD-10-CM | POA: Diagnosis present

## 2022-12-15 DIAGNOSIS — N179 Acute kidney failure, unspecified: Secondary | ICD-10-CM

## 2022-12-15 DIAGNOSIS — R531 Weakness: Secondary | ICD-10-CM | POA: Diagnosis present

## 2022-12-15 DIAGNOSIS — I130 Hypertensive heart and chronic kidney disease with heart failure and stage 1 through stage 4 chronic kidney disease, or unspecified chronic kidney disease: Secondary | ICD-10-CM | POA: Diagnosis present

## 2022-12-15 DIAGNOSIS — M86171 Other acute osteomyelitis, right ankle and foot: Secondary | ICD-10-CM | POA: Diagnosis present

## 2022-12-15 DIAGNOSIS — E11628 Type 2 diabetes mellitus with other skin complications: Secondary | ICD-10-CM | POA: Diagnosis present

## 2022-12-15 DIAGNOSIS — I4891 Unspecified atrial fibrillation: Secondary | ICD-10-CM | POA: Diagnosis not present

## 2022-12-15 DIAGNOSIS — F329 Major depressive disorder, single episode, unspecified: Secondary | ICD-10-CM | POA: Diagnosis present

## 2022-12-15 DIAGNOSIS — L97516 Non-pressure chronic ulcer of other part of right foot with bone involvement without evidence of necrosis: Secondary | ICD-10-CM | POA: Diagnosis present

## 2022-12-15 DIAGNOSIS — E1143 Type 2 diabetes mellitus with diabetic autonomic (poly)neuropathy: Secondary | ICD-10-CM | POA: Diagnosis present

## 2022-12-15 DIAGNOSIS — R600 Localized edema: Secondary | ICD-10-CM | POA: Diagnosis not present

## 2022-12-15 DIAGNOSIS — E11621 Type 2 diabetes mellitus with foot ulcer: Secondary | ICD-10-CM | POA: Diagnosis present

## 2022-12-15 DIAGNOSIS — I5042 Chronic combined systolic (congestive) and diastolic (congestive) heart failure: Secondary | ICD-10-CM | POA: Diagnosis present

## 2022-12-15 DIAGNOSIS — L97519 Non-pressure chronic ulcer of other part of right foot with unspecified severity: Secondary | ICD-10-CM | POA: Diagnosis not present

## 2022-12-15 DIAGNOSIS — E1169 Type 2 diabetes mellitus with other specified complication: Secondary | ICD-10-CM | POA: Diagnosis present

## 2022-12-15 DIAGNOSIS — D631 Anemia in chronic kidney disease: Secondary | ICD-10-CM | POA: Diagnosis not present

## 2022-12-15 DIAGNOSIS — M86172 Other acute osteomyelitis, left ankle and foot: Secondary | ICD-10-CM | POA: Diagnosis present

## 2022-12-15 DIAGNOSIS — M86671 Other chronic osteomyelitis, right ankle and foot: Secondary | ICD-10-CM | POA: Diagnosis not present

## 2022-12-15 DIAGNOSIS — M86471 Chronic osteomyelitis with draining sinus, right ankle and foot: Secondary | ICD-10-CM | POA: Diagnosis not present

## 2022-12-15 DIAGNOSIS — L089 Local infection of the skin and subcutaneous tissue, unspecified: Secondary | ICD-10-CM | POA: Diagnosis not present

## 2022-12-15 DIAGNOSIS — L97509 Non-pressure chronic ulcer of other part of unspecified foot with unspecified severity: Secondary | ICD-10-CM | POA: Diagnosis not present

## 2022-12-15 DIAGNOSIS — L03031 Cellulitis of right toe: Secondary | ICD-10-CM | POA: Diagnosis not present

## 2022-12-15 DIAGNOSIS — I480 Paroxysmal atrial fibrillation: Secondary | ICD-10-CM | POA: Diagnosis present

## 2022-12-15 DIAGNOSIS — M86472 Chronic osteomyelitis with draining sinus, left ankle and foot: Secondary | ICD-10-CM | POA: Diagnosis not present

## 2022-12-15 DIAGNOSIS — L03115 Cellulitis of right lower limb: Secondary | ICD-10-CM | POA: Diagnosis present

## 2022-12-15 HISTORY — DX: Acute kidney failure, unspecified: N17.9

## 2022-12-15 HISTORY — DX: Osteomyelitis, unspecified: M86.9

## 2022-12-15 LAB — PROTIME-INR
INR: 2.6 — ABNORMAL HIGH (ref 0.8–1.2)
Prothrombin Time: 27.9 seconds — ABNORMAL HIGH (ref 11.4–15.2)

## 2022-12-15 LAB — BASIC METABOLIC PANEL
Anion gap: 10 (ref 5–15)
BUN: 48 mg/dL — ABNORMAL HIGH (ref 8–23)
CO2: 22 mmol/L (ref 22–32)
Calcium: 8.8 mg/dL — ABNORMAL LOW (ref 8.9–10.3)
Chloride: 104 mmol/L (ref 98–111)
Creatinine, Ser: 2.07 mg/dL — ABNORMAL HIGH (ref 0.61–1.24)
GFR, Estimated: 32 mL/min — ABNORMAL LOW (ref >60)
Glucose, Bld: 151 mg/dL — ABNORMAL HIGH (ref 70–99)
Potassium: 3.9 mmol/L (ref 3.5–5.1)
Sodium: 136 mmol/L (ref 135–145)

## 2022-12-15 LAB — URINALYSIS, ROUTINE W REFLEX MICROSCOPIC
Bilirubin Urine: NEGATIVE
Glucose, UA: >=500 mg/dL — AB
Ketones, ur: NEGATIVE mg/dL
Leukocytes,Ua: NEGATIVE
Nitrite: NEGATIVE
Protein, ur: 100 mg/dL — AB
Specific Gravity, Urine: 1.01 (ref 1.005–1.030)
pH: 5 (ref 5.0–8.0)

## 2022-12-15 LAB — GLUCOSE, CAPILLARY
Glucose-Capillary: 205 mg/dL — ABNORMAL HIGH (ref 70–99)
Glucose-Capillary: 282 mg/dL — ABNORMAL HIGH (ref 70–99)

## 2022-12-15 LAB — URINALYSIS, MICROSCOPIC (REFLEX)

## 2022-12-15 MED ORDER — ACETAMINOPHEN 325 MG PO TABS: 650.0000 mg | ORAL_TABLET | Freq: Four times a day (QID) | ORAL | Status: DC | PRN

## 2022-12-15 MED ORDER — ONDANSETRON HCL 4 MG/2ML IJ SOLN: 4.0000 mg | Freq: Four times a day (QID) | INTRAMUSCULAR | Status: DC | PRN

## 2022-12-15 MED ORDER — INSULIN ASPART 100 UNIT/ML IJ SOLN: 3.0000 [IU] | Freq: Once | INTRAMUSCULAR | Status: DC

## 2022-12-15 MED ORDER — ACETAMINOPHEN 650 MG RE SUPP: 650.0000 mg | Freq: Four times a day (QID) | RECTAL | Status: DC | PRN

## 2022-12-15 MED ORDER — INSULIN ASPART 100 UNIT/ML IJ SOLN
2.0000 [IU] | Freq: Once | INTRAMUSCULAR | Status: AC
  Administered 2022-12-15: 2 [IU] via SUBCUTANEOUS

## 2022-12-15 MED ORDER — HYDRALAZINE HCL 20 MG/ML IJ SOLN
10.0000 mg | Freq: Once | INTRAMUSCULAR | Status: AC
  Administered 2022-12-15: 10 mg via INTRAVENOUS
  Filled 2022-12-15: qty 1

## 2022-12-15 MED ORDER — VANCOMYCIN HCL IN DEXTROSE 1-5 GM/200ML-% IV SOLN
1000.0000 mg | INTRAVENOUS | Status: DC
  Administered 2022-12-16 – 2022-12-17 (×2): 1000 mg via INTRAVENOUS
  Filled 2022-12-15 (×2): qty 200

## 2022-12-15 MED ORDER — SODIUM CHLORIDE 0.9 % IV SOLN
2.0000 g | Freq: Two times a day (BID) | INTRAVENOUS | Status: DC
  Administered 2022-12-15 – 2022-12-16 (×2): 2 g via INTRAVENOUS
  Filled 2022-12-15 (×2): qty 12.5

## 2022-12-15 MED ORDER — HYDROCODONE-ACETAMINOPHEN 5-325 MG PO TABS
1.0000 | ORAL_TABLET | ORAL | Status: DC | PRN
  Administered 2022-12-18: 1 via ORAL
  Filled 2022-12-15: qty 1

## 2022-12-15 MED ORDER — ONDANSETRON HCL 4 MG PO TABS: 4.0000 mg | ORAL_TABLET | Freq: Four times a day (QID) | ORAL | Status: DC | PRN

## 2022-12-15 MED ORDER — INSULIN ASPART 100 UNIT/ML IJ SOLN
0.0000 [IU] | Freq: Three times a day (TID) | INTRAMUSCULAR | Status: DC
  Administered 2022-12-16: 8 [IU] via SUBCUTANEOUS
  Administered 2022-12-16: 3 [IU] via SUBCUTANEOUS
  Administered 2022-12-16: 5 [IU] via SUBCUTANEOUS
  Administered 2022-12-17 (×2): 2 [IU] via SUBCUTANEOUS
  Administered 2022-12-17: 3 [IU] via SUBCUTANEOUS
  Administered 2022-12-18: 2 [IU] via SUBCUTANEOUS

## 2022-12-15 MED ORDER — SODIUM CHLORIDE 0.9 % IV SOLN
2.0000 g | INTRAVENOUS | Status: DC
  Administered 2022-12-15: 2 g via INTRAVENOUS
  Filled 2022-12-15: qty 12.5

## 2022-12-15 MED ORDER — SODIUM CHLORIDE 0.9 % IV SOLN
INTRAVENOUS | Status: DC | PRN
  Administered 2022-12-15: 250 mL via INTRAVENOUS

## 2022-12-15 MED ORDER — LACTATED RINGERS IV SOLN: INTRAVENOUS | Status: DC

## 2022-12-15 MED ORDER — METRONIDAZOLE 500 MG/100ML IV SOLN
500.0000 mg | Freq: Two times a day (BID) | INTRAVENOUS | Status: DC
  Administered 2022-12-15 – 2022-12-16 (×2): 500 mg via INTRAVENOUS
  Filled 2022-12-15 (×2): qty 100

## 2022-12-15 NOTE — Consult Note (Signed)
Reason for Consult: osteomyelitis  Referring Physician: Dr. Cherylann Ratel, DO  Seth [name] is an 81 y.o. male.  HPI: 81 y.o. male with medical history significant of HTN, DM, PAF on coumadin, HLD, chronic systolic HF, CAD s/p CABG. over the past week the patient has had weakness.  He fell out of his chair yesterday.  He presented with bilateral toe ulcerations with redness. His wife states they just noticed this 2 days ago.  Apparently the patient was reportedly walking barefoot outside per the chart.   Past Medical History:  Diagnosis Date   Acidosis 07/19/2021   Acute on chronic diastolic (congestive) heart failure ([place]) 09/30/2014   Atrial fibrillation (HCC)    a. coumadin;  b. Amiodarone   CAD (coronary artery disease)    a.  Lexiscan Myoview (02/14/14):  Apical, inferolateral scar; ? Mild ischemia; EF 32%.;  b.  LHC (02/21/14):  dLM 100%, LAD 100%, CFX 100%, dRCA 50%. 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 Echo (02/17/14):  EF 35-40%, anteroseptal and apical AK, Gr 2 DD, MAC, mod LAE.   Nephrolithiasis    Obesity, unspecified 11/11/2009   Peripheral vascular disease, unspecified ([place])    Prostate cancer ([place])    Proteinuria 09/30/2009   Thrombocytopenia, unspecified ([place])    Tobacco use disorder    Tobacco user 11/11/2009   Type 2 diabetes mellitus with diabetic peripheral angiopathy without gangrene ([place]) 07/19/2021   Type II or unspecified type diabetes mellitus with renal manifestations, uncontrolled(250.42)    Urolithiasis 12/26/1997    Past Surgical History:   Procedure Laterality Date   BACK SURGERY     CATARACT EXTRACTION  2010   CLIPPING OF ATRIAL APPENDAGE Left 02/24/2014   Procedure: CLIPPING OF ATRIAL APPENDAGE;  Surgeon: Gaye Pollack, MD;  Location: [facility];  Service: Open Heart Surgery;  Laterality: Left;   COLONOSCOPY  2005   Normal   CORONARY ARTERY BYPASS GRAFT N/A 02/24/2014   Procedure: Coronary artery bypass graft times three using left internal mammary artery and left leg greater saphenous vein harvested endoscopically.;  Surgeon: Gaye Pollack, MD;  Location: [facility] OR;  Service: Open Heart Surgery;  Laterality: N/A;   INTRAOPERATIVE TRANSESOPHAGEAL ECHOCARDIOGRAM N/A 02/24/2014   Procedure: INTRAOPERATIVE TRANSESOPHAGEAL ECHOCARDIOGRAM;  Surgeon: Gaye Pollack, MD;  Location: [facility] OR;  Service: Open Heart Surgery;  Laterality: N/A;   LEFT HEART CATHETERIZATION WITH CORONARY ANGIOGRAM N/A 02/21/2014   Procedure: LEFT HEART CATHETERIZATION WITH CORONARY ANGIOGRAM;  Surgeon: Peter M Martinique, MD;  Location: [facility] CATH LAB;  Service: Cardiovascular;  Laterality: N/A;    Family History  Problem Relation Age of Onset   Cancer Mother 23       leukemia   Heart disease Father    Heart attack Father 16   Lung cancer Brother    Colon cancer Neg Hx    Rectal cancer Neg Hx     Social History:  reports that he has never smoked. He quit smokeless tobacco use about 8 years ago.  His smokeless tobacco use  included chew. He reports that he does not drink alcohol and does not use drugs.  Allergies: No Known Allergies  Medications: I have reviewed the patient's current medications.  Results for orders placed or performed during the hospital encounter of 12/14/22 (from the past 48 hour(s))  CBG monitoring, ED     Status: Abnormal   Collection Time: 12/14/22  8:27 PM  Result Value Ref Range   Glucose-Capillary 201 (H) 70 - 99 mg/dL    Comment: Glucose reference range applies only to samples taken after fasting for at least 8 hours.  CBC with  Differential     Status: Abnormal   Collection Time: 12/14/22  8:33 PM  Result Value Ref Range   WBC 13.3 (H) 4.0 - 10.5 K/uL   RBC 4.05 (L) 4.22 - 5.81 MIL/uL   Hemoglobin 11.6 (L) 13.0 - 17.0 g/dL   HCT 35.4 (L) 39.0 - 52.0 %   MCV 87.4 80.0 - 100.0 fL   MCH 28.6 26.0 - 34.0 pg   MCHC 32.8 30.0 - 36.0 g/dL   RDW 13.1 11.5 - 15.5 %   Platelets 176 150 - 400 K/uL   nRBC 0.0 0.0 - 0.2 %   Neutrophils Relative % 79 %   Neutro Abs 10.4 (H) 1.7 - 7.7 K/uL   Lymphocytes Relative 10 %   Lymphs Abs 1.3 0.7 - 4.0 K/uL   Monocytes Relative 11 %   Monocytes Absolute 1.4 (H) 0.1 - 1.0 K/uL   Eosinophils Relative 0 %   Eosinophils Absolute 0.0 0.0 - 0.5 K/uL   Basophils Relative 0 %   Basophils Absolute 0.0 0.0 - 0.1 K/uL   Immature Granulocytes 0 %   Abs Immature Granulocytes 0.05 0.00 - 0.07 K/uL    Comment: Performed at [facility], [address] [place], [place] [address]  Comprehensive metabolic panel     Status: Abnormal   Collection Time: 12/14/22  8:33 PM  Result Value Ref Range   Sodium 133 (L) 135 - 145 mmol/L   Potassium 4.5 3.5 - 5.1 mmol/L   Chloride 99 98 - 111 mmol/L   CO2 21 (L) 22 - 32 mmol/L   Glucose, Bld 210 (H) 70 - 99 mg/dL    Comment: Glucose reference range applies only to samples taken after fasting for at least 8 hours.   BUN 49 (H) 8 - 23 mg/dL   Creatinine, Ser 2.35 (H) 0.61 - 1.24 mg/dL   Calcium 8.9 8.9 - 10.3 mg/dL   Total Protein 8.1 6.5 - 8.1 g/dL   Albumin 3.2 (L) 3.5 - 5.0 g/dL   AST 29 15 - 41 U/L   ALT 21 0 - 44 U/L   Alkaline Phosphatase 61 38 - 126 U/L   Total Bilirubin 0.8 0.3 - 1.2 mg/dL   GFR, Estimated 27 (L) >60 mL/min    Comment: (NOTE) Calculated using the CKD-EPI Creatinine Equation (2021)    Anion gap 13 5 - 15    Comment: Performed at [facility], [address] [place], [place] [address]  Protime-INR     Status: Abnormal   Collection Time: 12/14/22  8:33 PM  Result Value Ref Range   Prothrombin Time  25.0 (H) 11.4 - 15.2 seconds   INR 2.3 (H) 0.8 - 1.2    Comment: (NOTE) INR goal varies based on device and disease states. Performed at [facility], [address], [place],  [address]  Urinalysis, Routine w reflex microscopic Urine, Clean Catch     Status: Abnormal   Collection Time: 12/15/22 12:24 AM  Result Value Ref Range   Color, Urine YELLOW YELLOW   APPearance CLEAR CLEAR   Specific Gravity, Urine 1.010 1.005 - 1.030   pH 5.0 5.0 - 8.0   Glucose, UA >=500 (A) NEGATIVE mg/dL   Hgb urine dipstick MODERATE (A) NEGATIVE   Bilirubin Urine NEGATIVE NEGATIVE   Ketones, ur NEGATIVE NEGATIVE mg/dL   Protein, ur 100 (A) NEGATIVE mg/dL   Nitrite NEGATIVE NEGATIVE   Leukocytes,Ua NEGATIVE NEGATIVE    Comment: Performed at [facility], [place], [place] [address]  Urinalysis, Microscopic (reflex)     Status: Abnormal   Collection Time: 12/15/22 12:24 AM  Result Value Ref Range   RBC / HPF 6-10 0 - 5 RBC/hpf   WBC, UA 0-5 0 - 5 WBC/hpf   Bacteria, UA FEW (A) NONE SEEN   Squamous Epithelial / LPF 0-5 0 - 5    Comment: Performed at [facility], [address], [place], [place] [address]  Basic metabolic panel     Status: Abnormal   Collection Time: 12/15/22  8:40 AM  Result Value Ref Range   Sodium 136 135 - 145 mmol/L   Potassium 3.9 3.5 - 5.1 mmol/L   Chloride 104 98 - 111 mmol/L   CO2 22 22 - 32 mmol/L   Glucose, Bld 151 (H) 70 - 99 mg/dL    Comment: Glucose reference range applies only to samples taken after fasting for at least 8 hours.   BUN 48 (H) 8 - 23 mg/dL   Creatinine, Ser 2.07 (H) 0.61 - 1.24 mg/dL   Calcium 8.8 (L) 8.9 - 10.3 mg/dL   GFR, Estimated 32 (L) >60 mL/min    Comment: (NOTE) Calculated using the CKD-EPI Creatinine Equation (2021)    Anion gap 10 5 - 15    Comment: Performed at [facility], [address] [place], [place] [address]  Protime-INR     Status: Abnormal    Collection Time: 12/15/22  8:40 AM  Result Value Ref Range   Prothrombin Time 27.9 (H) 11.4 - 15.2 seconds   INR 2.6 (H) 0.8 - 1.2    Comment: (NOTE) INR goal varies based on device and disease states. Performed at [facility], [address] [place], [place] [address]     DG Foot Complete Left  Result Date: 12/14/2022 CLINICAL DATA:  Wound EXAM: LEFT FOOT - COMPLETE 3+ VIEW COMPARISON:  None Available. FINDINGS: No fracture or malalignment. Vascular calcifications. Ulcer distal great toe medial and plantar surface. No emphysema. Possible mild resorptive change at the base of first distal phalanx. Small erosion volar aspect of the proximal phalanx. Arthritis at the first MTP joint. Mild resorption tuft of second distal phalanx. Possible small erosions at the middle phalanx of the fifth digit. IMPRESSION: 1. Ulcer distal great toe with possible mild resorptive change at the base of the first distal phalanx, possible osteomyelitis. Small erosion volar aspect of the proximal phalanx either inflammatory or infectious. 2. Possible small erosions at the middle phalanx of the fifth digit and tuft of second distal phalanx. Correlate for signs of active infection at these digits. Electronically Signed   By: Donavan Foil M.D.   On: 12/14/2022 21:33   DG Foot Complete Right  Result Date: 12/14/2022 CLINICAL DATA:  Wound EXAM: RIGHT FOOT COMPLETE - 3+ VIEW  COMPARISON:  06/10/2016 FINDINGS: No fracture or malalignment. Moderate arthritis at the first MTP joint. Vascular calcifications. Ulcer at the distal great toe on the medial side. There is slight erosive change at the base of the first distal phalanx. IMPRESSION: 1. Soft tissue ulceration at the distal great toe with slight erosive change at the base of the first distal phalanx, suspicious for osteomyelitis. 2. Moderate arthritis at the first MTP joint. Electronically Signed   By: Donavan Foil M.D.   On: 12/14/2022 21:30    Review of  Systems Blood pressure (!) 143/62, pulse 69, temperature 98.5 F (36.9 C), temperature source Oral, resp. rate 17, height _0  (1.778 m), weight 97.5 kg, SpO2 97 %. Physical Exam General: AAO x3, NAD  Dermatological: Ulcerations are present plantar aspect of bilateral hallux bilaterally with edema and erythema present to the hallux bilaterally.  Right side is worse than left.  Also wound to the distal aspect the left second toe.  There is no drainage or pus.  There is no fluctuance or crepitation.           Vascular: Pulses palpable but decreased.  There is no pain with calf compression, swelling, warmth, erythema.   Neruologic: Sensation decreased  Musculoskeletal: No pain on exam  Assessment/Plan: Bilateral foot ulcerations with cellulitis and concern for possible osteomyelitis  -I reviewed the x-rays.  MRIs are pending. -ABI ordered  -Continue antibiotics.  Currently on cefepime, Flagyl, vancomycin -Upon entering the room the patient's wife was adamant that we are not to proceed with amputation at this time.  I had a long discussion with the patient's wife as well as daughter who was at bedside that we can await the results of the MRI as well as the ABI before proceeding with further treatment for now we will continue with antibiotics and monitoring the wounds.  If there is further concern for osteomyelitis we discussed different treatment options for this.  Hopefully get the MRI as well as the ABI back by tomorrow and come up with a better plan. -Podiatry will continue to follow- Dr. Lanae Crumbly will see the patient tomorrow.  Trula Slade 12/15/2022, 6:12 PM

## 2022-12-15 NOTE — ED Notes (Signed)
Linens and pad changed, bed soaked with urine. Condom cath applied . Daughter at bedside

## 2022-12-15 NOTE — H&P (Addendum)
History and Physical    Patient: [name] VZC:[id number] DOB: May 23, 1941 DOA: 12/14/2022 DOS: the patient was seen and examined on 12/15/2022 PCP: Orma Render, NP  Patient coming from: Home  Chief Complaint:  Chief Complaint  Patient presents with   Weakness   HPI: Seth [name] is a 81 y.o. male with medical history significant of HTN, DM, PAF on coumadin, HLD, chronic systolic HF, CAD s/p CABG. Presenting with b/l toe wounds. He reports that he was walking barefoot outside and scraped his feet along some roots last week. His wife reports that he had become increasingly week over the last week and has had problems with ambulation. She has been shuffling. His mobility has been worsening. He apparently slid out of his chair yesterday and was unable to get up for several hours. As his wife examined him more, she noted that he was having high sugars despite not eating well during this time. She then noticed the wounds on his toes and figured that he had an infection. She decided to bring him to the ED for evaluation. They deny any other aggravating or alleviating factors.   Review of Systems: As mentioned in the history of present illness. All other systems reviewed and are negative. Past Medical History:  Diagnosis Date   Acidosis 07/19/2021   Acute on chronic diastolic (congestive) heart failure ([place]) 09/30/2014   Atrial fibrillation (HCC)    a. coumadin;  b. Amiodarone   CAD (coronary artery disease)    a.  Lexiscan Myoview (02/14/14):  Apical, inferolateral scar; ? Mild ischemia; EF 32%.;  b.  LHC (02/21/14):  dLM 100%, LAD 100%, CFX 100%, dRCA 50%. => CABG (L-LAD, S-OM, S-PDA)   Chronic systolic heart failure (HCC)    CKD (chronic kidney disease)    proteinuria   Diabetes mellitus with peripheral autonomic neuropathy ([place]) 09/30/2014   Hemorrhage of gastrointestinal tract, unspecified    Hemorrhage of rectum and anus    History of urinary stone 10/08/2007   HLD (hyperlipidemia)     HTN (hypertension)    Hypertension, benign 06/10/2010   Impotence of organic origin    Ischemic cardiomyopathy    a. Echo (02/17/14):  EF 35-40%, anteroseptal and apical AK, Gr 2 DD, MAC, mod LAE.   Nephrolithiasis    Obesity, unspecified 11/11/2009   Peripheral vascular disease, unspecified ([place])    Prostate cancer ([place])    Proteinuria 09/30/2009   Thrombocytopenia, unspecified ([place])    Tobacco use disorder    Tobacco user 11/11/2009   Type 2 diabetes mellitus with diabetic peripheral angiopathy without gangrene ([place]) 07/19/2021   Type II or unspecified type diabetes mellitus with renal manifestations, uncontrolled(250.42)    Urolithiasis 12/26/1997   Past Surgical History:  Procedure Laterality Date   BACK SURGERY     CATARACT EXTRACTION  2010   CLIPPING OF ATRIAL APPENDAGE Left 02/24/2014   Procedure: CLIPPING OF ATRIAL APPENDAGE;  Surgeon: Gaye Pollack, MD;  Location: [facility];  Service: Open Heart Surgery;  Laterality: Left;   COLONOSCOPY  2005   Normal   CORONARY ARTERY BYPASS GRAFT N/A 02/24/2014   Procedure: Coronary artery bypass graft times three using left internal mammary artery and left leg greater saphenous vein harvested endoscopically.;  Surgeon: Gaye Pollack, MD;  Location: [facility];  Service: Open Heart Surgery;  Laterality: N/A;   INTRAOPERATIVE TRANSESOPHAGEAL ECHOCARDIOGRAM N/A 02/24/2014   Procedure: INTRAOPERATIVE TRANSESOPHAGEAL ECHOCARDIOGRAM;  Surgeon: Gaye Pollack, MD;  Location: [facility];  Service:  Open Heart Surgery;  Laterality: N/A;   LEFT HEART CATHETERIZATION WITH CORONARY ANGIOGRAM N/A 02/21/2014   Procedure: LEFT HEART CATHETERIZATION WITH CORONARY ANGIOGRAM;  Surgeon: Peter M Martinique, MD;  Location: [facility] C[facility] LAB;  Service: Cardiovascular;  Laterality: N/A;   Social History:  reports that he has never smoked. He quit smokeless tobacco use about 8 years ago.  His smokeless tobacco use included chew. He reports that he does not drink alcohol and does not use  drugs.  No Known Allergies  Family History  Problem Relation Age of Onset   Cancer Mother 42       leukemia   Heart disease Father    Heart attack Father 23   Lung cancer Brother    Colon cancer Neg Hx    Rectal cancer Neg Hx     Prior to Admission medications   Medication Sig Start Date End Date Taking? Authorizing Provider  aspirin EC 81 MG tablet Take 1 tablet (81 mg total) by mouth daily. 03/17/14   [name] T, PA-C  atorvastatin (LIPITOR) 40 MG tablet Take 1 tablet (40 mg total) by mouth daily. 01/31/22   [name], MD  Calcium Carbonate-Vitamin D 600-200 MG-UNIT TABS Take 1 tablet by mouth daily. For bones    [provider]  carvedilol (COREG) 12.5 MG tablet Take 1 tablet (12.5 mg total) by mouth 2 (two) times daily with a meal. 01/31/22   [name], MD  citalopram (CELEXA) 10 MG tablet Take 10 mg by mouth daily. 06/23/22   [provider]  cloNIDine (CATAPRES) 0.1 MG tablet Take by mouth. 06/23/22   [provider]  Continuous Blood Gluc Receiver (DEXCOM G6 RECEIVER) DEVI 1 Device by Does not apply route as directed. 08/13/21   Shamleffer, Melanie Crazier, MD  Continuous Blood Gluc Sensor (DEXCOM G6 SENSOR) MISC 1 Device by Does not apply route as directed. 08/13/21   Shamleffer, Melanie Crazier, MD  Continuous Blood Gluc Transmit (DEXCOM G6 TRANSMITTER) MISC 1 Device by Does not apply route as directed. 08/13/21   Shamleffer, Melanie Crazier, MD  dapagliflozin propanediol (FARXIGA) 10 MG TABS tablet Take 1 tablet (10 mg total) by mouth daily before breakfast. 07/20/22   Shamleffer, Melanie Crazier, MD  Flaxseed, Linseed, (FLAXSEED OIL PO) Take 1 tablet by mouth daily.    [provider]  glipiZIDE (GLUCOTROL) 5 MG tablet Take 0.5 tablets (2.5 mg total) by mouth daily before breakfast. 07/20/22   Shamleffer, Melanie Crazier, MD  hydrALAZINE (APRESOLINE) 25 MG tablet Take 1 tablet (25 mg total) by mouth daily in the  afternoon. TAKE 25MG AT 3PM HOLD IF SBP <140 MAY TAKE ADDITIONAL IF SBP >160 07/27/22   Deberah Pelton, NP  lisinopril (ZESTRIL) 40 MG tablet Take 1 tablet (40 mg total) by mouth 2 (two) times daily. 01/31/22   [name], MD  Multiple Vitamin (MULTIVITAMINS PO) Take 1 tablet by mouth daily.    [provider]  potassium chloride SA (KLOR-CON M) 20 MEQ tablet Take 2 tablets (40 mEq total) by mouth daily. 01/31/22   [name], MD  spironolactone (ALDACTONE) 25 MG tablet Take 0.5 tablets (12.5 mg total) by mouth daily. 07/27/22   Deberah Pelton, NP  torsemide (DEMADEX) 20 MG tablet Take 1 tablet (20 mg total) by mouth daily. Patient taking differently: Take 30 mg by mouth daily. 01/31/22   [name], MD  warfarin (COUMADIN) 5 MG tablet Take 1 to 2 tablets by mouth  daily as directed by Anticoagulation Clinic. 07/27/22   [name], MD    Physical Exam: Vitals:   12/15/22 0900 12/15/22 1100 12/15/22 1330 12/15/22 1454  BP: (!) 172/72 (!) 144/67 (!) 166/61 (!) 143/62  Pulse: 72 64 66 69  Resp: _0 Temp:  98.2 F (36.8 C) 98.2 F (36.8 C) 98.5 F (36.9 C)  TempSrc:    Oral  SpO2: 98% 97% 98% 97%  Weight:      Height:       General: 81 y.o. male resting in bed in NAD Eyes: PERRL, normal sclera ENMT: Nares patent w/o discharge, orophaynx clear, dentition normal, ears w/o discharge/lesions/ulcers Neck: Supple, trachea midline Cardiovascular: RRR, +S1, S2, no m/g/r, equal pulses throughout Respiratory: CTABL, no w/r/r, normal WOB GI: BS+, NDNT, no masses noted, no organomegaly noted MSK: No c/c; eschars, edema, and erythema of both great toes Neuro: A&O x 3, no focal deficits Psyc: Appropriate interaction and affect, calm/cooperative  Data Reviewed:  Results for orders placed or performed during the hospital encounter of 12/14/22 (from the past 24 hour(s))  CBG monitoring, ED     Status: Abnormal   Collection Time: 12/14/22   8:27 PM  Result Value Ref Range   Glucose-Capillary 201 (H) 70 - 99 mg/dL  CBC with Differential     Status: Abnormal   Collection Time: 12/14/22  8:33 PM  Result Value Ref Range   WBC 13.3 (H) 4.0 - 10.5 K/uL   RBC 4.05 (L) 4.22 - 5.81 MIL/uL   Hemoglobin 11.6 (L) 13.0 - 17.0 g/dL   HCT 35.4 (L) 39.0 - 52.0 %   MCV 87.4 80.0 - 100.0 fL   MCH 28.6 26.0 - 34.0 pg   MCHC 32.8 30.0 - 36.0 g/dL   RDW 13.1 11.5 - 15.5 %   Platelets 176 150 - 400 K/uL   nRBC 0.0 0.0 - 0.2 %   Neutrophils Relative % 79 %   Neutro Abs 10.4 (H) 1.7 - 7.7 K/uL   Lymphocytes Relative 10 %   Lymphs Abs 1.3 0.7 - 4.0 K/uL   Monocytes Relative 11 %   Monocytes Absolute 1.4 (H) 0.1 - 1.0 K/uL   Eosinophils Relative 0 %   Eosinophils Absolute 0.0 0.0 - 0.5 K/uL   Basophils Relative 0 %   Basophils Absolute 0.0 0.0 - 0.1 K/uL   Immature Granulocytes 0 %   Abs Immature Granulocytes 0.05 0.00 - 0.07 K/uL  Comprehensive metabolic panel     Status: Abnormal   Collection Time: 12/14/22  8:33 PM  Result Value Ref Range   Sodium 133 (L) 135 - 145 mmol/L   Potassium 4.5 3.5 - 5.1 mmol/L   Chloride 99 98 - 111 mmol/L   CO2 21 (L) 22 - 32 mmol/L   Glucose, Bld 210 (H) 70 - 99 mg/dL   BUN 49 (H) 8 - 23 mg/dL   Creatinine, Ser 2.35 (H) 0.61 - 1.24 mg/dL   Calcium 8.9 8.9 - 10.3 mg/dL   Total Protein 8.1 6.5 - 8.1 g/dL   Albumin 3.2 (L) 3.5 - 5.0 g/dL   AST 29 15 - 41 U/L   ALT 21 0 - 44 U/L   Alkaline Phosphatase 61 38 - 126 U/L   Total Bilirubin 0.8 0.3 - 1.2 mg/dL   GFR, Estimated 27 (L) >60 mL/min   Anion gap 13 5 - 15  Protime-INR     Status: Abnormal   Collection Time: 12/14/22  8:33 PM  Result Value Ref Range   Prothrombin Time 25.0 (H) 11.4 - 15.2 seconds   INR 2.3 (H) 0.8 - 1.2  Urinalysis, Routine w reflex microscopic Urine, Clean Catch     Status: Abnormal   Collection Time: 12/15/22 12:24 AM  Result Value Ref Range   Color, Urine YELLOW YELLOW   APPearance CLEAR CLEAR   Specific Gravity, Urine  1.010 1.005 - 1.030   pH 5.0 5.0 - 8.0   Glucose, UA >=500 (A) NEGATIVE mg/dL   Hgb urine dipstick MODERATE (A) NEGATIVE   Bilirubin Urine NEGATIVE NEGATIVE   Ketones, ur NEGATIVE NEGATIVE mg/dL   Protein, ur 100 (A) NEGATIVE mg/dL   Nitrite NEGATIVE NEGATIVE   Leukocytes,Ua NEGATIVE NEGATIVE  Urinalysis, Microscopic (reflex)     Status: Abnormal   Collection Time: 12/15/22 12:24 AM  Result Value Ref Range   RBC / HPF 6-10 0 - 5 RBC/hpf   WBC, UA 0-5 0 - 5 WBC/hpf   Bacteria, UA FEW (A) NONE SEEN   Squamous Epithelial / LPF 0-5 0 - 5  Basic metabolic panel     Status: Abnormal   Collection Time: 12/15/22  8:40 AM  Result Value Ref Range   Sodium 136 135 - 145 mmol/L   Potassium 3.9 3.5 - 5.1 mmol/L   Chloride 104 98 - 111 mmol/L   CO2 22 22 - 32 mmol/L   Glucose, Bld 151 (H) 70 - 99 mg/dL   BUN 48 (H) 8 - 23 mg/dL   Creatinine, Ser 2.07 (H) 0.61 - 1.24 mg/dL   Calcium 8.8 (L) 8.9 - 10.3 mg/dL   GFR, Estimated 32 (L) >60 mL/min   Anion gap 10 5 - 15  Protime-INR     Status: Abnormal   Collection Time: 12/15/22  8:40 AM  Result Value Ref Range   Prothrombin Time 27.9 (H) 11.4 - 15.2 seconds   INR 2.6 (H) 0.8 - 1.2   XR Right Foot 1. Soft tissue ulceration at the distal great toe with slight erosive change at the base of the first distal phalanx, suspicious for osteomyelitis. 2. Moderate arthritis at the first MTP joint.  XR Left Foot 1. Ulcer distal great toe with possible mild resorptive change at the base of the first distal phalanx, possible osteomyelitis. Small erosion volar aspect of the proximal phalanx either inflammatory or infectious. 2. Possible small erosions at the middle phalanx of the fifth digit and tuft of second distal phalanx. Correlate for signs of active infection at these digits.  Assessment and Plan: Osteomyelitis of b/l great toes     - admitted to inpt, med-surg     - continue vanc, cefepime; add flagyl     - podiatry consulted; they will  review, appreciate assistance     - ok to eat tonight     - MRI b/l feet ordered      DM2     - A1c, SSI, glucose checks, DM diet  AKI     - fluids     - watch nephrotoxins     - check renal US  HTN Chronic HFrEF     - no evidence of volume overload     - watch I&O; will be receiving fluids for AKI     - hold diuretics for tonight     - hold lisinopril for tonight  CAD s/p CABG HLD     - continue home regimen when confirmed  PAF     -  hold coumadin for tonight; otherwise continue home regimen when confirmed  Advance Care Planning:   Code Status: FULL  Consults: Podiatry (Dr. Jacqualyn Posey)  Family Communication: w/ wife at bedside  Severity of Illness: The appropriate patient status for this patient is INPATIENT. Inpatient status is judged to be reasonable and necessary in order to provide the required intensity of service to ensure the patient's safety. The patient's presenting symptoms, physical exam findings, and initial radiographic and laboratory data in the context of their chronic comorbidities is felt to place them at high risk for further clinical deterioration. Furthermore, it is not anticipated that the patient will be medically stable for discharge from the hospital within 2 midnights of admission.   * I certify that at the point of admission it is my clinical judgment that the patient will require inpatient hospital care spanning beyond 2 midnights from the point of admission due to high intensity of service, high risk for further deterioration and high frequency of surveillance required.*  Author: Jonnie Finner, DO 12/15/2022 4:08 PM  For on call review [email or web address].

## 2022-12-15 NOTE — ED Notes (Signed)
Report given to [facility]

## 2022-12-15 NOTE — Progress Notes (Signed)
Pharmacy Antibiotic Note  Seth [name] is a 81 y.o. male admitted on 12/14/2022 with  osteomyelitis .  Pharmacy has been consulted for vanc dosing. Patient also on cefepime and flagyl  Plan: Vanc 2g IV x 1 already given 12/20 at ~2300. Start 1g IV q36 thereafter - goal AUC 400-550 Cefepime 2g  IV q12 per current renal function  Height: '5\' 10"'$  (177.8 cm) Weight: 97.5 kg (215 lb) IBW/kg (Calculated) : 73  Temp (24hrs), Avg:98.2 F (36.8 C), Min:98 F (36.7 C), Max:98.5 F (36.9 C)  Recent Labs  Lab 12/14/22 2033 12/15/22 0840  WBC 13.3*  --   CREATININE 2.35* 2.07*    Estimated Creatinine Clearance: 33.3 mL/min (A) (by C-G formula based on SCr of 2.07 mg/dL (H)).    No Known Allergies    Thank you for allowing pharmacy to be a part of this patient's care.  Kara Mead 12/15/2022 4:33 PM

## 2022-12-15 NOTE — ED Notes (Signed)
Pt assisted with using bedside commode. Moved his bowels, and was cleaned with warm rag and skin care product.

## 2022-12-15 NOTE — Plan of Care (Signed)
[facility] will assume care on arrival to accepting facility. Until arrival, care as per EDP. However, [facility] available 24/7 for questions and assistance.  Nursing staff, please page [facility] Admits and Consults ([phone]) as soon as the patient arrives to the hospital.   

## 2022-12-16 ENCOUNTER — Inpatient Hospital Stay (HOSPITAL_COMMUNITY): Payer: Medicare Other

## 2022-12-16 DIAGNOSIS — M8618 Other acute osteomyelitis, other site: Secondary | ICD-10-CM

## 2022-12-16 DIAGNOSIS — M86471 Chronic osteomyelitis with draining sinus, right ankle and foot: Secondary | ICD-10-CM | POA: Diagnosis not present

## 2022-12-16 DIAGNOSIS — M86171 Other acute osteomyelitis, right ankle and foot: Secondary | ICD-10-CM | POA: Diagnosis not present

## 2022-12-16 DIAGNOSIS — L0390 Cellulitis, unspecified: Secondary | ICD-10-CM

## 2022-12-16 DIAGNOSIS — M86172 Other acute osteomyelitis, left ankle and foot: Secondary | ICD-10-CM | POA: Diagnosis not present

## 2022-12-16 DIAGNOSIS — I96 Gangrene, not elsewhere classified: Secondary | ICD-10-CM | POA: Diagnosis not present

## 2022-12-16 DIAGNOSIS — M869 Osteomyelitis, unspecified: Secondary | ICD-10-CM | POA: Diagnosis not present

## 2022-12-16 LAB — CBC
HCT: 32 % — ABNORMAL LOW (ref 39.0–52.0)
Hemoglobin: 10.1 g/dL — ABNORMAL LOW (ref 13.0–17.0)
MCH: 28.4 pg (ref 26.0–34.0)
MCHC: 31.6 g/dL (ref 30.0–36.0)
MCV: 89.9 fL (ref 80.0–100.0)
Platelets: 143 10*3/uL — ABNORMAL LOW (ref 150–400)
RBC: 3.56 MIL/uL — ABNORMAL LOW (ref 4.22–5.81)
RDW: 13.1 % (ref 11.5–15.5)
WBC: 8.9 10*3/uL (ref 4.0–10.5)
nRBC: 0 % (ref 0.0–0.2)

## 2022-12-16 LAB — COMPREHENSIVE METABOLIC PANEL
ALT: 21 U/L (ref 0–44)
AST: 24 U/L (ref 15–41)
Albumin: 2.7 g/dL — ABNORMAL LOW (ref 3.5–5.0)
Alkaline Phosphatase: 47 U/L (ref 38–126)
Anion gap: 10 (ref 5–15)
BUN: 44 mg/dL — ABNORMAL HIGH (ref 8–23)
CO2: 20 mmol/L — ABNORMAL LOW (ref 22–32)
Calcium: 8.5 mg/dL — ABNORMAL LOW (ref 8.9–10.3)
Chloride: 107 mmol/L (ref 98–111)
Creatinine, Ser: 1.9 mg/dL — ABNORMAL HIGH (ref 0.61–1.24)
GFR, Estimated: 35 mL/min — ABNORMAL LOW (ref >60)
Glucose, Bld: 164 mg/dL — ABNORMAL HIGH (ref 70–99)
Potassium: 3.6 mmol/L (ref 3.5–5.1)
Sodium: 137 mmol/L (ref 135–145)
Total Bilirubin: 0.6 mg/dL (ref 0.3–1.2)
Total Protein: 6.8 g/dL (ref 6.5–8.1)

## 2022-12-16 LAB — GLUCOSE, CAPILLARY
Glucose-Capillary: 176 mg/dL — ABNORMAL HIGH (ref 70–99)
Glucose-Capillary: 271 mg/dL — ABNORMAL HIGH (ref 70–99)
Glucose-Capillary: 211 mg/dL — ABNORMAL HIGH (ref 70–99)
Glucose-Capillary: 189 mg/dL — ABNORMAL HIGH (ref 70–99)

## 2022-12-16 LAB — PROTIME-INR
INR: 3 — ABNORMAL HIGH (ref 0.8–1.2)
Prothrombin Time: 30.8 seconds — ABNORMAL HIGH (ref 11.4–15.2)

## 2022-12-16 MED ORDER — LACTATED RINGERS IV SOLN: INTRAVENOUS | Status: AC

## 2022-12-16 MED ORDER — CITALOPRAM HYDROBROMIDE 20 MG PO TABS
10.0000 mg | ORAL_TABLET | Freq: Every day | ORAL | Status: DC
  Administered 2022-12-16 – 2022-12-18 (×2): 10 mg via ORAL
  Filled 2022-12-16 (×2): qty 1

## 2022-12-16 MED ORDER — PHYTONADIONE 5 MG PO TABS
2.5000 mg | ORAL_TABLET | Freq: Once | ORAL | Status: AC
  Administered 2022-12-16: 2.5 mg via ORAL
  Filled 2022-12-16 (×2): qty 1

## 2022-12-16 MED ORDER — HYDRALAZINE HCL 25 MG PO TABS
25.0000 mg | ORAL_TABLET | Freq: Every day | ORAL | Status: DC
  Administered 2022-12-16 – 2022-12-17 (×2): 25 mg via ORAL
  Filled 2022-12-16 (×2): qty 1

## 2022-12-16 MED ORDER — METOPROLOL TARTRATE 5 MG/5ML IV SOLN: 5.0000 mg | INTRAVENOUS | Status: DC | PRN

## 2022-12-16 MED ORDER — GUAIFENESIN 100 MG/5ML PO LIQD: 5.0000 mL | ORAL | Status: DC | PRN

## 2022-12-16 MED ORDER — IPRATROPIUM-ALBUTEROL 0.5-2.5 (3) MG/3ML IN SOLN: 3.0000 mL | RESPIRATORY_TRACT | Status: DC | PRN

## 2022-12-16 MED ORDER — VITAMIN C 500 MG PO TABS
250.0000 mg | ORAL_TABLET | Freq: Every day | ORAL | Status: DC
  Administered 2022-12-16 – 2022-12-18 (×2): 250 mg via ORAL
  Filled 2022-12-16 (×2): qty 1

## 2022-12-16 MED ORDER — SENNOSIDES-DOCUSATE SODIUM 8.6-50 MG PO TABS: 1.0000 | ORAL_TABLET | Freq: Every evening | ORAL | Status: DC | PRN

## 2022-12-16 MED ORDER — GADOBUTROL 1 MMOL/ML IV SOLN
10.0000 mL | Freq: Once | INTRAVENOUS | Status: AC | PRN
  Administered 2022-12-16: 10 mL via INTRAVENOUS

## 2022-12-16 MED ORDER — ASPIRIN 81 MG PO TBEC
81.0000 mg | DELAYED_RELEASE_TABLET | Freq: Every day | ORAL | Status: DC
  Administered 2022-12-16 – 2022-12-18 (×2): 81 mg via ORAL
  Filled 2022-12-16 (×2): qty 1

## 2022-12-16 MED ORDER — HYDRALAZINE HCL 20 MG/ML IJ SOLN: 10.0000 mg | INTRAMUSCULAR | Status: DC | PRN

## 2022-12-16 MED ORDER — ATORVASTATIN CALCIUM 40 MG PO TABS
40.0000 mg | ORAL_TABLET | Freq: Every day | ORAL | Status: DC
  Administered 2022-12-16 – 2022-12-18 (×2): 40 mg via ORAL
  Filled 2022-12-16 (×2): qty 1

## 2022-12-16 MED ORDER — INSULIN GLARGINE-YFGN 100 UNIT/ML ~~LOC~~ SOLN
10.0000 [IU] | Freq: Every day | SUBCUTANEOUS | Status: DC
  Administered 2022-12-16: 10 [IU] via SUBCUTANEOUS
  Filled 2022-12-16: qty 0.1

## 2022-12-16 MED ORDER — SODIUM CHLORIDE 0.9 % IV SOLN
2.0000 g | INTRAVENOUS | Status: DC
  Administered 2022-12-16 – 2022-12-17 (×2): 2 g via INTRAVENOUS
  Filled 2022-12-16 (×2): qty 20

## 2022-12-16 MED ORDER — ZINC SULFATE 220 (50 ZN) MG PO CAPS
220.0000 mg | ORAL_CAPSULE | Freq: Every day | ORAL | Status: DC
  Administered 2022-12-16 – 2022-12-18 (×2): 220 mg via ORAL
  Filled 2022-12-16 (×2): qty 1

## 2022-12-16 MED ORDER — OXYCODONE HCL 5 MG PO TABS: 5.0000 mg | ORAL_TABLET | ORAL | Status: DC | PRN

## 2022-12-16 MED ORDER — TRAZODONE HCL 50 MG PO TABS: 50.0000 mg | ORAL_TABLET | Freq: Every evening | ORAL | Status: DC | PRN

## 2022-12-16 MED ORDER — CARVEDILOL 12.5 MG PO TABS
12.5000 mg | ORAL_TABLET | Freq: Two times a day (BID) | ORAL | Status: DC
  Administered 2022-12-16 – 2022-12-18 (×4): 12.5 mg via ORAL
  Filled 2022-12-16 (×4): qty 1

## 2022-12-16 NOTE — Consult Note (Signed)
ASSESSMENT & PLAN   GANGRENE BILATERAL GREAT TOES WITH OSTEOMYELITIS: This patient has wounds on both great toes.  I would recommend soaking the feet in lukewarm Dial soap soaks to keep the wounds dry and then doing a dry gauze dressing daily.  In addition he is on intravenous antibiotics and is followed by infectious disease.  He is now on ceftriaxone and vancomycin.    On exam he has a palpable femoral and popliteal pulse bilaterally with biphasic Doppler signals in both feet.  For this reason, currently I would not recommend arteriography unless the wounds progress significantly, or if he has wound healing problems if podiatry elects to do surgery on his toes.  He has a palpable femoral and popliteal pulse bilaterally with biphasic Doppler signals in both feet.  ABIs and toe pressures could not be obtained.  CT angiogram would not provide adequate visualization of the tibial vessels.  Thus the only option is an arteriogram which would be associated with some risk given his age, medical comorbidities, and chronic kidney disease.  If the wounds do progress that he would have to be transferred to [facility] and we could potentially do an arteriogram on Tuesday with a possible tibial intervention.  However, again, for now I would continue with aggressive wound care.  If podiatry feels that toe amputations are indicated but I think it would be safe to proceed and certainly we can proceed with arteriography if there are any wound healing issues.    REASON FOR CONSULT:    Bilateral foot wounds.  To evaluate for peripheral arterial disease.  HPI:   Seth [name] is a 81 y.o. male who was admitted with generalized weakness which had been going on for several days.  During this admission he was noted to have wounds on both great toes.  He has a history of diabetes and vascular surgery was consulted to evaluate his circulation.  On my history, he does admit to bilateral calf claudication.  He gets  pain in his calves which is brought on by ambulation and relieved with rest.  He denies any history of rest pain.  He thinks he had the wounds on his toes a little over a week.  The patient does have a history of coronary artery disease.  He is undergone previous coronary revascularization.  Vein was taken from both legs.  He also has a history of diabetes, hypertension, congestive heart failure, and chronic kidney disease.   Past Medical History:  Diagnosis Date   Acidosis 07/19/2021   Acute on chronic diastolic (congestive) heart failure ([place]) 09/30/2014   Atrial fibrillation (HCC)    a. coumadin;  b. Amiodarone   CAD (coronary artery disease)    a.  Lexiscan Myoview (02/14/14):  Apical, inferolateral scar; ? Mild ischemia; EF 32%.;  b.  LHC (02/21/14):  dLM 100%, LAD 100%, CFX 100%, dRCA 50%. => CABG (L-LAD, S-OM, S-PDA)   Chronic systolic heart failure (HCC)    CKD (chronic kidney disease)    proteinuria   Diabetes mellitus with peripheral autonomic neuropathy ([place]) 09/30/2014   Hemorrhage of gastrointestinal tract, unspecified    Hemorrhage of rectum and anus    History of urinary stone 10/08/2007   HLD (hyperlipidemia)    HTN (hypertension)    Hypertension, benign 06/10/2010   Impotence of organic origin    Ischemic cardiomyopathy    a. Echo (02/17/14):  EF 35-40%, anteroseptal and apical AK, Gr 2 DD, MAC, mod LAE.   Nephrolithiasis  Obesity, unspecified 11/11/2009   Peripheral vascular disease, unspecified ([place])    Prostate cancer ([place])    Proteinuria 09/30/2009   Thrombocytopenia, unspecified ([place])    Tobacco use disorder    Tobacco user 11/11/2009   Type 2 diabetes mellitus with diabetic peripheral angiopathy without gangrene ([place]) 07/19/2021   Type II or unspecified type diabetes mellitus with renal manifestations, uncontrolled(250.42)    Urolithiasis 12/26/1997    Family History  Problem Relation Age of Onset   Cancer Mother 58       leukemia   Heart disease Father     Heart attack Father 10   Lung cancer Brother    Colon cancer Neg Hx    Rectal cancer Neg Hx     SOCIAL HISTORY: Social History   Tobacco Use   Smoking status: Never   Smokeless tobacco: Former    Types: Chew    Quit date: 02/23/2014  Substance Use Topics   Alcohol use: No    No Known Allergies  Current Facility-Administered Medications  Medication Dose Route Frequency Provider Last Rate Last Admin   0.9 %  sodium chloride infusion   Intravenous PRN Marylyn Ishihara, Tyrone A, DO 10 mL/hr at 12/16/22 0430 Restarted at 12/16/22 0430   acetaminophen (TYLENOL) tablet 650 mg  650 mg Oral Q6H PRN Cherylann Ratel A, DO       Or   acetaminophen (TYLENOL) suppository 650 mg  650 mg Rectal Q6H PRN Marylyn Ishihara, Tyrone A, DO       ascorbic acid (VITAMIN C) tablet 250 mg  250 mg Oral Daily Amin, Ankit Chirag, MD       aspirin EC tablet 81 mg  81 mg Oral Daily Amin, Ankit Chirag, MD       atorvastatin (LIPITOR) tablet 40 mg  40 mg Oral Daily Amin, Ankit Chirag, MD       carvedilol (COREG) tablet 12.5 mg  12.5 mg Oral BID WC Amin, Ankit Chirag, MD       cefTRIAXone (ROCEPHIN) 2 g in sodium chloride 0.9 % 100 mL IVPB  2 g Intravenous Q24H Laurice Record, MD       citalopram (CELEXA) tablet 10 mg  10 mg Oral Daily Amin, Ankit Chirag, MD       guaiFENesin (ROBITUSSIN) 100 MG/5ML liquid 5 mL  5 mL Oral Q4H PRN Amin, Ankit Chirag, MD       hydrALAZINE (APRESOLINE) injection 10 mg  10 mg Intravenous Q4H PRN Amin, Ankit Chirag, MD       hydrALAZINE (APRESOLINE) tablet 25 mg  25 mg Oral Q1500 Amin, Ankit Chirag, MD       HYDROcodone-acetaminophen (NORCO/VICODIN) 5-325 MG per tablet 1-2 tablet  1-2 tablet Oral Q4H PRN Marylyn Ishihara, Tyrone A, DO       insulin aspart (novoLOG) injection 0-15 Units  0-15 Units Subcutaneous TID WC Kyle, Tyrone A, DO   8 Units at 12/16/22 1202   insulin glargine-yfgn (SEMGLEE) injection 10 Units  10 Units Subcutaneous QHS Amin, Ankit Chirag, MD       ipratropium-albuterol (DUONEB) 0.5-2.5 (3) MG/3ML  nebulizer solution 3 mL  3 mL Nebulization Q4H PRN Amin, Ankit Chirag, MD       lactated ringers infusion   Intravenous Continuous Amin, Ankit Chirag, MD       metoprolol tartrate (LOPRESSOR) injection 5 mg  5 mg Intravenous Q4H PRN Amin, Ankit Chirag, MD       ondansetron (ZOFRAN) tablet 4 mg  4 mg Oral Q6H PRN Cherylann Ratel  A, DO       Or   ondansetron (ZOFRAN) injection 4 mg  4 mg Intravenous Q6H PRN Marylyn Ishihara, Tyrone A, DO       oxyCODONE (Oxy IR/ROXICODONE) immediate release tablet 5 mg  5 mg Oral Q4H PRN Amin, Ankit Chirag, MD       senna-docusate (Senokot-S) tablet 1 tablet  1 tablet Oral QHS PRN Amin, Ankit Chirag, MD       traZODone (DESYREL) tablet 50 mg  50 mg Oral QHS PRN Amin, Ankit Chirag, MD       vancomycin (VANCOCIN) IVPB 1000 mg/200 mL premix  1,000 mg Intravenous Q36H Adrian Saran, RPH 200 mL/hr at 12/16/22 1055 1,000 mg at 12/16/22 1055   zinc sulfate capsule 220 mg  220 mg Oral Daily Amin, Ankit Chirag, MD        REVIEW OF SYSTEMS:  _0  denotes positive finding, _1  denotes negative finding Cardiac  Comments:  Chest pain or chest pressure:    Shortness of breath upon exertion:    Short of breath when lying flat:    Irregular heart rhythm: x       Vascular    Pain in calf, thigh, or hip brought on by ambulation: x   Pain in feet at night that wakes you up from your sleep:     Blood clot in your veins:    Leg swelling:         Pulmonary    Oxygen at home:    Productive cough:     Wheezing:         Neurologic    Sudden weakness in arms or legs:     Sudden numbness in arms or legs:     Sudden onset of difficulty speaking or slurred speech:    Temporary loss of vision in one eye:     Problems with dizziness:         Gastrointestinal    Blood in stool:     Vomited blood:         Genitourinary    Burning when urinating:     Blood in urine:        Psychiatric    Major depression:         Hematologic    Bleeding problems:    Problems with blood clotting  too easily:        Skin    Rashes or ulcers: x       Constitutional    Fever or chills:    -  PHYSICAL EXAM:   Vitals:   12/15/22 2327 12/16/22 0043 12/16/22 0608 12/16/22 1401  BP: (!) 173/60 (!) 120/45 117/66 (!) 131/46  Pulse: 63 71 74 82  Resp: _2 (!) 22  Temp: 98.8 F (37.1 C) 98.7 F (37.1 C) 98.3 F (36.8 C) 98.5 F (36.9 C)  TempSrc: Oral Oral Oral Oral  SpO2: 95% 95% 97% 92%  Weight:      Height:       Body mass index is 30.85 kg/m. GENERAL: The patient is a well-nourished male, in no acute distress. The vital signs are documented above. CARDIAC: There is a regular rate and rhythm.  VASCULAR: I could not detect carotid bruits.  However, it was somewhat difficult to assess as it was difficult to get him to hold his breath. On the right side he has palpable femoral and popliteal pulse.  He has a biphasic posterior tibial signal and a barely biphasic dorsalis  pedis signal. On the left side he has a palpable femoral and popliteal pulse.  He has a biphasic dorsalis pedis and posterior tibial signal. PULMONARY: There is good air exchange bilaterally without wheezing or rales. ABDOMEN: Soft and non-tender with normal pitched bowel sounds.  I do not appreciate an aneurysm. MUSCULOSKELETAL: There are no major deformities. NEUROLOGIC: No focal weakness or paresthesias are detected. SKIN: He has wounds on both great toes as documented in the photographs below.     PSYCHIATRIC: The patient has a normal affect.  DATA:    ARTERIAL DOPPLER STUDY: I have independently interpreted his arterial Doppler study that was done today.  On the right side, there was a multiphasic posterior tibial signal with a monophasic dorsalis pedis signal.  ABI could not be obtained as he had calcific disease.  In addition because of the wound on the great toe a toe pressure could not be obtained.  On the left side he has a multiphasic posterior tibial and dorsalis pedis signal.  Again an  ABI could not be obtained as the arteries are calcified.  The toe pressure could not be obtained because of the wound.  MRI RIGHT FOOT:Osteomyelitis involving the medial base of the great toe distal phalanx with adjacent soft tissue ulcer and cellulitis. No evidence of soft tissue abscess.  MRI LEFT FOOT: Osteomyelitis involving the distal phalanges of the first and second toes, with adjacent soft tissue ulcers and cellulitis. No evidence of soft tissue abscess.    LABS: I reviewed his labs from yesterday.  Creatinine was 1.9.  His GFR was 35.   Deitra Mayo [facility]

## 2022-12-16 NOTE — TOC Initial Note (Signed)
Transition of Care [facility]) - Initial/Assessment Note    Patient Details  Name: Seth [name] MRN: [id number] Date of Birth: [birth year]  Transition of Care [facility]) CM/SW Contact:    Angelita Ingles, RN Phone Number:[phone]  12/16/2022, 12:46 PM  Clinical Narrative:                  Transition of Care [facility]) Screening Note   Patient Details  Name: Seth [name] Date of Birth: April 13, 1941   Transition of Care [facility]) CM/SW Contact:    Angelita Ingles, RN Phone Number: 12/16/2022, 12:46 PM    Transition of Care Department [facility]) has reviewed patient and no TOC needs have been identified at this time. We will continue to monitor patient advancement through interdisciplinary progression rounds. If new patient transition needs arise, please place a TOC consult.          Patient Goals and CMS Choice            Expected Discharge Plan and Services                                              Prior Living Arrangements/Services                       Activities of Daily Living Home Assistive Devices/Equipment: None ADL Screening (condition at time of admission) Patient's cognitive ability adequate to safely complete daily activities?: Yes Is the patient deaf or have difficulty hearing?: No Does the patient have difficulty seeing, even when wearing glasses/contacts?: No Does the patient have difficulty concentrating, remembering, or making decisions?: No Patient able to express need for assistance with ADLs?: Yes Does the patient have difficulty dressing or bathing?: No Independently performs ADLs?: Yes (appropriate for developmental age) Does the patient have difficulty walking or climbing stairs?: Yes Weakness of Legs: Both Weakness of Arms/Hands: Both  Permission Sought/Granted                  Emotional Assessment              Admission diagnosis:  Osteomyelitis ([place]) [M86.9] AKI (acute kidney injury) ([place]) [N17.9] Other acute  osteomyelitis of foot, unspecified laterality ([place]) [M86.179] Patient Active Problem List   Diagnosis Date Noted   Osteomyelitis ([place]) 12/15/2022   AKI (acute kidney injury) ([place]) 12/15/2022   Forgetfulness 04/21/2022   Dyslipidemia 11/17/2021   Edema due to congestive heart failure ([place]) 10/05/2021   Weakness generalized 10/05/2021   Type 2 diabetes mellitus with stage 3a chronic kidney disease, with long-term current use of insulin ([place]) 08/15/2021   Suspected sleep apnea 07/27/2021   Anticoagulated on Coumadin 07/26/2021   Hypertensive heart and chronic kidney disease with heart failure and stage 1 through stage 4 chronic kidney disease, or unspecified chronic kidney disease ([place]) 07/19/2021   Long term (current) use of insulin ([place]) 07/19/2021   Long term (current) use of oral hypoglycemic drugs 07/19/2021   Other forms of acute ischemic heart disease 07/19/2021   Personal history of nicotine dependence 07/19/2021   Presence of aortocoronary bypass graft 07/19/2021   Ventricular premature depolarization 07/19/2021   Type 2 diabetes mellitus with diabetic chronic kidney disease ([place]) 07/19/2021   Shortness of breath 07/16/2021   Coronary artery disease of bypass graft of native heart with stable angina pectoris ([place]) 04/16/2021   Type 2  diabetes mellitus with diabetic autonomic neuropathy, with long-term current use of insulin ([place]) 01/09/2020   Type 2 diabetes mellitus with hyperglycemia, with long-term current use of insulin ([place]) 45/36/4680   Chronic systolic congestive heart failure ([place]) 02/08/2019   Type 2 diabetes mellitus with microalbuminuria, with long-term current use of insulin ([place]) 10/27/2017   Ischemic cardiomyopathy 09/30/2014   Type 2 diabetes mellitus with vascular disease ([place]) 05/27/2014   Anemia 03/18/2014   Leukocytosis 03/18/2014   Encounter for therapeutic drug monitoring 03/17/2014   PAF (paroxysmal atrial fibrillation) ([place]) 02/28/2014   S/P CABG x 3  02/24/2014   Unstable angina ([place]) 02/21/2014   Obesity, morbid ([place]) 05/22/2013   CKD (chronic kidney disease) 05/22/2013   Diabetes ([place]) 09/14/2010   PVD (peripheral vascular disease) ([place]) 05/12/2010   Establishing care with new doctor, encounter for 05/12/2010   Thrombocytopenia ([place]) 02/10/2010   Impotence, organic 10/08/2007   History of prostate cancer 10/08/2007   HTN (hypertension) 10/27/2005   Coronary atherosclerosis 02/04/2000   Hyperlipidemia, unspecified 04/26/1999   PCP:  Orma Render, NP Pharmacy:   [facility], [place] [address] Phone: [phone] Fax: [place], [place] - [address] [place] [address] Phone: [phone] Fax: [place] [phone], [place] [address] [place] [address] Phone: [phone] Fax: [place], [place] - [address] [place] [address] Phone: [phone] Fax: [phone]     Social Determinants of Health (SDOH) Social History: SDOH Screenings   Food Insecurity: No Food Insecurity (12/15/2022)  Housing: Low Risk  (12/15/2022)  Transportation Needs: No Transportation Needs (12/15/2022)  Utilities: Not At Risk (12/15/2022)  Alcohol Screen: Low Risk  (02/08/2019)  Depression (PHQ2-9): Low Risk  (04/12/2022)  Tobacco Use: Medium Risk (12/15/2022)   SDOH Interventions:     Readmission Risk Interventions     No data to display

## 2022-12-16 NOTE — Progress Notes (Signed)
PROGRESS NOTE    Seth [name]  QIW:[id number] DOB: 1941-09-01 DOA: 12/14/2022 PCP: Orma Render, NP   Brief Narrative:  81 year old with history of HTN, DM 2, paroxysmal A-fib on Coumadin, HLD, chronic systolic CHF, CAD status post CABG admitted to the hospital for bilateral wounds in his toes which has worsened over the past week limiting his ambulation.  X-ray was suggestive of possible osteomyelitis.  Podiatry was consulted.  Due to concerns of vascular disease, ABI and MRI of bilateral feet were ordered.  Currently on empiric IV vancomycin, cefepime and Flagyl.   Assessment & Plan:  Principal Problem:   Osteomyelitis ([place]) Active Problems:   Diabetes (HCC)   HTN (hypertension)   PAF (paroxysmal atrial fibrillation) (HCC)   Chronic systolic congestive heart failure (HCC)   Coronary artery disease of bypass graft of native heart with stable angina pectoris (HCC)   Dyslipidemia   AKI (acute kidney injury) ([place])   Bilateral osteomyelitis of great toes - Likely from underlying vascular disease.  X-ray suggestive of possible osteomyelitis, MRI of bilateral feet is consistent with osteomyelitis, ABI shows noncompressible blood vessel, no toe pressures reported.  Podiatry following.  Also spoke with Dr. Scot Dock from vascular who see the patient.  Will consult ID as well for Abx recs.   Acute kidney injury -Baseline creatinine 1.4.  Admission creatinine 2.35, slowly improving.  Continue IV fluids  History of CAD status post CABG, March 2015 -Chest pain-free.  On aspirin, statin, Coreg, lisinopril, Aldactone and torsemide  Congestive heart failure with preserved ejection fraction -Echocardiogram performed in September 2022 in the chart appears to be poor quality report but it mentions ejection fraction of 60%.  Echocardiogram in 2020 shows EF of 55% with moderate LVH. On aspirin, statin, Coreg, lisinopril, Aldactone and torsemide  History of diabetes mellitus type 2 -Will hold home  p.o. medications, will place him on sliding scale, Accu-Chek and long-acting insulin here.  Follows with outpatient endocrinology - A1c  Paroxysmal atrial fibrillation -He is on Coumadin at home.  Continue Coreg.  IV as needed  Hyperlipidemia -Lipitor  Essential hypertension -Continue home p.o. medications as mentioned above.  IV as needed   DVT prophylaxis: On Coumadin Code Status: Full code Family Communication: Daughter at bedside  Status is: Inpatient Maintain hospital stay for IV antibiotics and osteomyelitis management    Subjective: Seen and examined at bedside.  No other complaints   Examination:  General exam: Appears calm and comfortable  Respiratory system: Clear to auscultation. Respiratory effort normal. Cardiovascular system: S1 & S2 heard, RRR. No JVD, murmurs, rubs, gallops or clicks. No pedal edema. Gastrointestinal system: Abdomen is nondistended, soft and nontender. No organomegaly or masses felt. Normal bowel sounds heard. Central nervous system: Alert and oriented. No focal neurological deficits. Extremities: Symmetric 5 x 5 power. Skin: No rashes, lesions or ulcers Psychiatry: Judgement and insight appear normal. Mood & affect appropriate.  Bilateral toe dressing in place   Objective: Vitals:   12/15/22 1914 12/15/22 2327 12/16/22 0043 12/16/22 0608  BP: (!) 152/57 (!) 173/60 (!) 120/45 117/66  Pulse: 68 63 71 74  Resp: '14 14 14 14  '$ Temp: 99.8 F (37.7 C) 98.8 F (37.1 C) 98.7 F (37.1 C) 98.3 F (36.8 C)  TempSrc: Oral Oral Oral Oral  SpO2: 92% 95% 95% 97%  Weight:      Height:        Intake/Output Summary (Last 24 hours) at 12/16/2022 4174 Last data filed at 12/16/2022 0600 Gross per  24 hour  Intake 1025.46 ml  Output --  Net 1025.46 ml   Filed Weights   12/14/22 2019  Weight: 97.5 kg     Data Reviewed:   CBC: Recent Labs  Lab 12/14/22 2033 12/16/22 0553  WBC 13.3* 8.9  NEUTROABS 10.4*  --   HGB 11.6* 10.1*  HCT  35.4* 32.0*  MCV 87.4 89.9  PLT 176 188*   Basic Metabolic Panel: Recent Labs  Lab 12/14/22 2033 12/15/22 0840 12/16/22 0553  NA 133* 136 137  K 4.5 3.9 3.6  CL 99 104 107  CO2 21* 22 20*  GLUCOSE 210* 151* 164*  BUN 49* 48* 44*  CREATININE 2.35* 2.07* 1.90*  CALCIUM 8.9 8.8* 8.5*   GFR: Estimated Creatinine Clearance: 36.3 mL/min (A) (by C-G formula based on SCr of 1.9 mg/dL (H)). Liver Function Tests: Recent Labs  Lab 12/14/22 2033 12/16/22 0553  AST 29 24  ALT 21 21  ALKPHOS 61 47  BILITOT 0.8 0.6  PROT 8.1 6.8  ALBUMIN 3.2* 2.7*   No results for input(s): "LIPASE", "AMYLASE" in the last 168 hours. No results for input(s): "AMMONIA" in the last 168 hours. Coagulation Profile: Recent Labs  Lab 12/14/22 2033 12/15/22 0840 12/16/22 0553  INR 2.3* 2.6* 3.0*   Cardiac Enzymes: No results for input(s): "CKTOTAL", "CKMB", "CKMBINDEX", "TROPONINI" in the last 168 hours. BNP (last 3 results) No results for input(s): "PROBNP" in the last 8760 hours. HbA1C: No results for input(s): "HGBA1C" in the last 72 hours. CBG: Recent Labs  Lab 12/14/22 2027 12/15/22 2103 12/15/22 2342 12/16/22 0750  GLUCAP 201* 282* 205* 176*   Lipid Profile: No results for input(s): "CHOL", "HDL", "LDLCALC", "TRIG", "CHOLHDL", "LDLDIRECT" in the last 72 hours. Thyroid Function Tests: No results for input(s): "TSH", "T4TOTAL", "FREET4", "T3FREE", "THYROIDAB" in the last 72 hours. Anemia Panel: No results for input(s): "VITAMINB12", "FOLATE", "FERRITIN", "TIBC", "IRON", "RETICCTPCT" in the last 72 hours. Sepsis Labs: No results for input(s): "PROCALCITON", "LATICACIDVEN" in the last 168 hours.  No results found for this or any previous visit (from the past 240 hour(s)).       Radiology Studies: US RENAL  Result Date: 12/15/2022 CLINICAL DATA:  Acute kidney injury EXAM: RENAL / URINARY TRACT ULTRASOUND COMPLETE COMPARISON:  MRI abdomen dated 11/26/2020 FINDINGS: Right Kidney:  Renal measurements: 12.7 x 5.8 x 6.4 cm = volume: 248 mL. Echogenicity within normal limits. Two lower pole simple cyst measuring 3.3 x 2.7 x 3.3 cm and 2.6 x 2.3 x 2.6 cm, benign. No hydronephrosis. Left Kidney: Renal measurements: 13.3 x 5.4 x 4.8 cm = volume: 180 mL. Echogenicity within normal limits. Two lower pole simple cyst measuring 1.2 x 1.2 x 1.0 cm and 1.1 x 1.1 x 1.2 cm, benign. No hydronephrosis. Bladder: Appears normal for degree of bladder distention. Other: None. IMPRESSION: Bilateral renal cysts, simple/benign.  No follow-up is recommended. No hydronephrosis. Electronically Signed   By: Julian Hy M.D.   On: 12/15/2022 19:35   DG Foot Complete Left  Result Date: 12/14/2022 CLINICAL DATA:  Wound EXAM: LEFT FOOT - COMPLETE 3+ VIEW COMPARISON:  None Available. FINDINGS: No fracture or malalignment. Vascular calcifications. Ulcer distal great toe medial and plantar surface. No emphysema. Possible mild resorptive change at the base of first distal phalanx. Small erosion volar aspect of the proximal phalanx. Arthritis at the first MTP joint. Mild resorption tuft of second distal phalanx. Possible small erosions at the middle phalanx of the fifth digit. IMPRESSION: 1. Ulcer distal  great toe with possible mild resorptive change at the base of the first distal phalanx, possible osteomyelitis. Small erosion volar aspect of the proximal phalanx either inflammatory or infectious. 2. Possible small erosions at the middle phalanx of the fifth digit and tuft of second distal phalanx. Correlate for signs of active infection at these digits. Electronically Signed   By: Donavan Foil M.D.   On: 12/14/2022 21:33   DG Foot Complete Right  Result Date: 12/14/2022 CLINICAL DATA:  Wound EXAM: RIGHT FOOT COMPLETE - 3+ VIEW COMPARISON:  06/10/2016 FINDINGS: No fracture or malalignment. Moderate arthritis at the first MTP joint. Vascular calcifications. Ulcer at the distal great toe on the medial side. There  is slight erosive change at the base of the first distal phalanx. IMPRESSION: 1. Soft tissue ulceration at the distal great toe with slight erosive change at the base of the first distal phalanx, suspicious for osteomyelitis. 2. Moderate arthritis at the first MTP joint. Electronically Signed   By: Donavan Foil M.D.   On: 12/14/2022 21:30        Scheduled Meds:  insulin aspart  0-15 Units Subcutaneous TID WC   Continuous Infusions:  sodium chloride 10 mL/hr at 12/16/22 0430   ceFEPime (MAXIPIME) IV Stopped (12/15/22 2135)   lactated ringers 75 mL/hr at 12/16/22 0131   metronidazole Stopped (12/15/22 1820)   vancomycin       LOS: 1 day   Time spent= 35 mins    Giovonnie Trettel Arsenio Loader, MD [facility]  If 7PM-7AM, please contact night-coverage  12/16/2022, 8:07 AM

## 2022-12-16 NOTE — Progress Notes (Signed)
Patient has skin tear to right knee, visitor was assisting him to restroom before calling out for assistance. Skin tear cleansed with saline and covered with band-aid.

## 2022-12-16 NOTE — Progress Notes (Signed)
  Subjective:  Patient ID: Seth [name], male    DOB: December 06, 1941,  MRN: [id number]  Patient resting comfortably in bed. Daughters and wife present at bedside.   Negative for chest pain and shortness of breath Objective:   Vitals:   12/16/22 0608 12/16/22 1401  BP: 117/66 (!) 131/46  Pulse: 74 82  Resp: 14 (!) 22  Temp: 98.3 F (36.8 C) 98.5 F (36.9 C)  SpO2: 97% 92%   General AA&O x3. Normal mood and affect.  Vascular PT weakly palpable, non palp DP. Good capillary fill time and warmth  Neurologic Epicritic sensation grossly absent.  Dermatologic Necrotic ulcer R hallux, plantar with cellulitis and purulence, left hallux and distal second dry eschar  Orthopedic: MMT 5/5 in dorsiflexion, plantarflexion, inversion, and eversion. Normal joint ROM without pain or crepitus.      Summary:  Right: Resting right ankle-brachial index indicates noncompressible right  lower extremity arteries.   Unable to obtain TBI due to great toe ulceration and bandages.  Left: Resting left ankle-brachial index indicates noncompressible left  lower extremity arteries.   Unable to obtain TBI due to great toe ulceration and bandages.  *See table(s) above for measurements and observations.    LEFT MRI IMPRESSION: Osteomyelitis involving the distal phalanges of the first and second toes, with adjacent soft tissue ulcers and cellulitis. No evidence of soft tissue abscess.     Electronically Signed   By: Maurine Simmering M.D.   On: 12/16/2022 09:54  RIGHT MRI  IMPRESSION: Osteomyelitis involving the medial base of the great toe distal phalanx with adjacent soft tissue ulcer and cellulitis. No evidence of soft tissue abscess.   Torn flexor hallucis longus tendon with 3 mm retraction from its distal attachment.     Electronically Signed   By: Maurine Simmering M.D.   On: 12/16/2022 10:01   Assessment & Plan:  Patient was evaluated and treated and all questions answered.  Diabetic foot  infection, osteomyelitis -I reviewed results of MRI and ABI with patient and family. Discussed treatment of osteomyelitis with abx  vs amputation and all risks, benefits, and potential complications of each treatment option. I recommend partial amputation for all affected toes. They agree and wish to proceed -Vascular Surgery feels flow is adequate, input appreciated. Will notify if bleeding intra op is poor -NPO past MN -Vitamin K 2.'5mg'$  ordered for INR reversal. 500 INR ordered Goal is < 2.0  Criselda Peaches, DPM  Accessible via secure chat for questions or concerns.

## 2022-12-16 NOTE — Progress Notes (Signed)
ABI's have been completed. Preliminary results can be found in CV Proc through chart review.   12/16/22 8:45 AM Seth [name] RVT

## 2022-12-16 NOTE — Consult Note (Signed)
/        [facility]    Date of Admission:  12/14/2022   Total days of inpatient antibiotics 2        Reason for Consult: Osteomyelitis    Principal Problem:   Osteomyelitis ([place]) Active Problems:   Diabetes ([place])   HTN (hypertension)   PAF (paroxysmal atrial fibrillation) (HCC)   Chronic systolic congestive heart failure (HCC)   Coronary artery disease of bypass graft of native heart with stable angina pectoris ([place])   Dyslipidemia   AKI (acute kidney injury) ([place])   Assessment: 81 YM presented with difficulty ambulating admitted for osteomyelitis of bilateral great toes.  Family reports patient walks barefoot outside.  They noted wounds on his toes a couple of days ago.  #B/L foot osteomyelitis #DM A1c 6.8  #PVD  #CKD - Podiatry engaged, patient's wife adamant that did not want to proceed with amputation.  Plan was to await MRI and ABIs continue antibiotics then addressed plan. -MRI right foot showed osteomyelitis in the medial base of great toe distal phalanx with soft tissue ulcer and cellulitis - MRI of left foot showed osteomyelitis of the distal phalanges of first and second toes, subcu ulcers and cellulitis -ABIs showed noncompressible right lower extremity arteries, noncompressible left lower extremity arteries.  Unable to do TBI's due to ulcerations and bandages. - Spoke to wife and daughters today.  Wife is at wound care nurse, awaiting to speak to podiatry.  Continues to decline surgical intervention at this time. Recommendations:  -D/C cefepime and metronidazole -Start ceftriaxone -Continue vancomycin -Vascular Surgery engaged per ABI -Podiatry following     I have personally spent 85 minutes involved in face-to-face and non-face-to-face activities for this patient on the day of the visit. Professional time spent includes the following activities: Preparing to see the patient (review of tests), Obtaining and/or reviewing separately  obtained history (admission/discharge record), Performing a medically appropriate examination and/or evaluation , Ordering medications/tests/procedures, referring and communicating with other health care professionals, Documenting clinical information in the EMR, Independently interpreting results (not separately reported), Communicating results to the patient/family/caregiver, Counseling and educating the patient/family/caregiver and Care coordination (not separately reported).   ----------------------------------------------------------- Antibiotics: Vanco, cefepime and metronidazole    HPI: Seth [name] is a 81 y.o. male with hypertension, diabetes mellitus, PAF on Coumadin, HLD, CHF, CAD status post CABG admitted with bilateral foot osteomyelitis.  Noted that patient had been walking around barefoot, family noted he stomped on some barefoot last week.  He had increased pain and weakness.  On arrival to the emergency department WBC 13.3 K, patient afebrile.  X-rays showed evidence of bilateral great toe osteomyelitis.  Podiatry encouraged, with declining amputation.  Order MRI and ABIs for further recommendations.  ID engaged for antibiotic recommendations.   Review of Systems: Review of Systems  All other systems reviewed and are negative.   Past Medical History:  Diagnosis Date   Acidosis 07/19/2021   Acute on chronic diastolic (congestive) heart failure ([place]) 09/30/2014   Atrial fibrillation (HCC)    a. coumadin;  b. Amiodarone   CAD (coronary artery disease)    a.  Lexiscan Myoview (02/14/14):  Apical, inferolateral scar; ? Mild ischemia; EF 32%.;  b.  LHC (02/21/14):  dLM 100%, LAD 100%, CFX 100%, dRCA 50%. 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 Echo (02/17/14):  EF 35-40%, anteroseptal and apical AK, Gr 2 DD, MAC, mod LAE.   Nephrolithiasis    Obesity, unspecified 11/11/2009   Peripheral vascular disease, unspecified ([place])    Prostate cancer ([place])    Proteinuria 09/30/2009   Thrombocytopenia, unspecified ([place])    Tobacco use disorder    Tobacco user 11/11/2009   Type 2 diabetes mellitus with diabetic peripheral angiopathy without gangrene ([place]) 07/19/2021   Type II or unspecified type diabetes mellitus with renal manifestations, uncontrolled(250.42)    Urolithiasis 12/26/1997    Social History   Tobacco Use   Smoking status: Never   Smokeless tobacco: Former    Types: Chew    Quit date: 02/23/2014  Vaping Use   Vaping Use: Never used  Substance Use Topics   Alcohol use: No   Drug use: No    Family History  Problem Relation Age of Onset   Cancer Mother 5       leukemia   Heart disease Father    Heart attack Father 48   Lung cancer Brother    Colon cancer Neg Hx    Rectal cancer Neg Hx    Scheduled Meds:  ascorbic acid  250 mg Oral Daily   aspirin EC  81 mg Oral Daily   atorvastatin  40 mg Oral Daily   carvedilol  12.5 mg Oral BID WC   citalopram  10 mg Oral Daily   hydrALAZINE  25 mg Oral Q1500   insulin aspart  0-15 Units Subcutaneous TID WC   insulin glargine-yfgn  10 Units Subcutaneous QHS   zinc sulfate  220 mg Oral Daily   Continuous Infusions:  sodium chloride 10 mL/hr at 12/16/22 0430   cefTRIAXone (ROCEPHIN)  IV     lactated ringers     vancomycin 1,000 mg (12/16/22 1055)   PRN Meds:.sodium chloride, acetaminophen **OR** acetaminophen, guaiFENesin, hydrALAZINE, HYDROcodone-acetaminophen, ipratropium-albuterol, metoprolol tartrate, ondansetron **OR** ondansetron (ZOFRAN) IV, oxyCODONE, senna-docusate, traZODone No Known  Allergies  OBJECTIVE: Blood pressure 117/66, pulse 74, temperature 98.3 F (36.8 C), temperature source Oral, resp. rate 14, height _0  (1.778 m), weight 97.5 kg, SpO2 97 %.  Physical Exam Constitutional:      General: He is not in acute distress.    Appearance: He is normal weight. He is not toxic-appearing.  HENT:     Head: Normocephalic and atraumatic.     Right Ear: External ear normal.     Left Ear: External ear normal.     Nose: No congestion or rhinorrhea.     Mouth/Throat:     Mouth: Mucous membranes are moist.     Pharynx: Oropharynx is clear.  Eyes:     Extraocular Movements: Extraocular movements intact.     Conjunctiva/sclera: Conjunctivae normal.     Pupils: Pupils are equal, round, and reactive to light.  Cardiovascular:     Rate and Rhythm: Normal rate and regular rhythm.     Heart sounds: No murmur heard.    No friction rub. No gallop.  Pulmonary:     Effort: Pulmonary effort is normal.     Breath sounds: Normal breath sounds.  Abdominal:     General:  Abdomen is flat. Bowel sounds are normal.     Palpations: Abdomen is soft.  Musculoskeletal:        General: No swelling. Normal range of motion.     Cervical back: Normal range of motion and neck supple.  Skin:    General: Skin is warm and dry.  Neurological:     General: No focal deficit present.     Mental Status: He is oriented to person, place, and time.  Psychiatric:        Mood and Affect: Mood normal.     Lab Results Lab Results  Component Value Date   WBC 8.9 12/16/2022   HGB 10.1 (L) 12/16/2022   HCT 32.0 (L) 12/16/2022   MCV 89.9 12/16/2022   PLT 143 (L) 12/16/2022    Lab Results  Component Value Date   CREATININE 1.90 (H) 12/16/2022   BUN 44 (H) 12/16/2022   NA 137 12/16/2022   K 3.6 12/16/2022   CL 107 12/16/2022   CO2 20 (L) 12/16/2022    Lab Results  Component Value Date   ALT 21 12/16/2022   AST 24 12/16/2022   ALKPHOS 47 12/16/2022   BILITOT 0.6 12/16/2022        Laurice Record, [facility] 12/16/2022, 1:00 PM

## 2022-12-16 NOTE — Progress Notes (Signed)
OT Cancellation Note  Patient Details Name: Seth [name] MRN: [id number] DOB: 09-01-1941   Cancelled Treatment:    Reason Eval/Treat Not Completed: Other (comment). Family request that therapy hold evaluations until after patient has potential procedure -- they are awaiting podiatry visit. He is currently ambulating to bathroom with family without significant difficulty. Will follow.  [name] 12/16/2022, 2:46 PM

## 2022-12-17 ENCOUNTER — Inpatient Hospital Stay (HOSPITAL_COMMUNITY): Payer: Medicare Other | Admitting: Anesthesiology

## 2022-12-17 ENCOUNTER — Encounter (HOSPITAL_COMMUNITY)
Admission: EM | Disposition: A | Discharge status: Home / Self Care | Payer: Self-pay | Source: Home / Self Care | Attending: Internal Medicine

## 2022-12-17 DIAGNOSIS — I129 Hypertensive chronic kidney disease with stage 1 through stage 4 chronic kidney disease, or unspecified chronic kidney disease: Secondary | ICD-10-CM

## 2022-12-17 DIAGNOSIS — M86472 Chronic osteomyelitis with draining sinus, left ankle and foot: Secondary | ICD-10-CM | POA: Diagnosis not present

## 2022-12-17 DIAGNOSIS — Z7984 Long term (current) use of oral hypoglycemic drugs: Secondary | ICD-10-CM

## 2022-12-17 DIAGNOSIS — Z87891 Personal history of nicotine dependence: Secondary | ICD-10-CM

## 2022-12-17 DIAGNOSIS — M86479 Chronic osteomyelitis with draining sinus, unspecified ankle and foot: Secondary | ICD-10-CM | POA: Diagnosis not present

## 2022-12-17 DIAGNOSIS — D631 Anemia in chronic kidney disease: Secondary | ICD-10-CM

## 2022-12-17 DIAGNOSIS — L089 Local infection of the skin and subcutaneous tissue, unspecified: Secondary | ICD-10-CM

## 2022-12-17 DIAGNOSIS — N189 Chronic kidney disease, unspecified: Secondary | ICD-10-CM

## 2022-12-17 DIAGNOSIS — M86471 Chronic osteomyelitis with draining sinus, right ankle and foot: Secondary | ICD-10-CM | POA: Diagnosis not present

## 2022-12-17 DIAGNOSIS — I2510 Atherosclerotic heart disease of native coronary artery without angina pectoris: Secondary | ICD-10-CM

## 2022-12-17 DIAGNOSIS — E1122 Type 2 diabetes mellitus with diabetic chronic kidney disease: Secondary | ICD-10-CM

## 2022-12-17 HISTORY — PX: AMPUTATION TOE: SHX6595

## 2022-12-17 LAB — GLUCOSE, CAPILLARY
Glucose-Capillary: 147 mg/dL — ABNORMAL HIGH (ref 70–99)
Glucose-Capillary: 158 mg/dL — ABNORMAL HIGH (ref 70–99)
Glucose-Capillary: 126 mg/dL — ABNORMAL HIGH (ref 70–99)
Glucose-Capillary: 197 mg/dL — ABNORMAL HIGH (ref 70–99)

## 2022-12-17 LAB — HEMOGLOBIN A1C
Hgb A1c MFr Bld: 7.9 % — ABNORMAL HIGH (ref 4.8–5.6)
Mean Plasma Glucose: 180 mg/dL

## 2022-12-17 LAB — PROTIME-INR
INR: 2.4 — ABNORMAL HIGH (ref 0.8–1.2)
Prothrombin Time: 25.7 seconds — ABNORMAL HIGH (ref 11.4–15.2)

## 2022-12-17 LAB — CBC
HCT: 25.6 % — ABNORMAL LOW (ref 39.0–52.0)
Hemoglobin: 8.2 g/dL — ABNORMAL LOW (ref 13.0–17.0)
MCH: 28.7 pg (ref 26.0–34.0)
MCHC: 32 g/dL (ref 30.0–36.0)
MCV: 89.5 fL (ref 80.0–100.0)
Platelets: 115 10*3/uL — ABNORMAL LOW (ref 150–400)
RBC: 2.86 MIL/uL — ABNORMAL LOW (ref 4.22–5.81)
RDW: 13.3 % (ref 11.5–15.5)
WBC: 9 10*3/uL (ref 4.0–10.5)
nRBC: 0 % (ref 0.0–0.2)

## 2022-12-17 LAB — BASIC METABOLIC PANEL
Anion gap: 9 (ref 5–15)
BUN: 32 mg/dL — ABNORMAL HIGH (ref 8–23)
CO2: 20 mmol/L — ABNORMAL LOW (ref 22–32)
Calcium: 8.1 mg/dL — ABNORMAL LOW (ref 8.9–10.3)
Chloride: 106 mmol/L (ref 98–111)
Creatinine, Ser: 1.82 mg/dL — ABNORMAL HIGH (ref 0.61–1.24)
GFR, Estimated: 37 mL/min — ABNORMAL LOW (ref >60)
Glucose, Bld: 148 mg/dL — ABNORMAL HIGH (ref 70–99)
Potassium: 4 mmol/L (ref 3.5–5.1)
Sodium: 135 mmol/L (ref 135–145)

## 2022-12-17 LAB — TYPE AND SCREEN
ABO/RH(D): A NEG
Antibody Screen: NEGATIVE

## 2022-12-17 LAB — SURGICAL PCR SCREEN
MRSA, PCR: NEGATIVE
Staphylococcus aureus: NEGATIVE

## 2022-12-17 LAB — MAGNESIUM: Magnesium: 1.8 mg/dL (ref 1.7–2.4)

## 2022-12-17 SURGERY — AMPUTATION, TOE
Anesthesia: Monitor Anesthesia Care | Site: First Toe | Laterality: Bilateral

## 2022-12-17 MED ORDER — PROPOFOL 1000 MG/100ML IV EMUL
INTRAVENOUS | Status: AC
  Filled 2022-12-17: qty 100

## 2022-12-17 MED ORDER — SODIUM CHLORIDE 0.9% IV SOLUTION: Freq: Once | INTRAVENOUS | Status: AC

## 2022-12-17 MED ORDER — PROPOFOL 500 MG/50ML IV EMUL
INTRAVENOUS | Status: DC | PRN
  Administered 2022-12-17: 50 ug/kg/min via INTRAVENOUS

## 2022-12-17 MED ORDER — BUPIVACAINE HCL (PF) 0.5 % IJ SOLN
INTRAMUSCULAR | Status: DC | PRN
  Administered 2022-12-17 (×2): 10 mL

## 2022-12-17 MED ORDER — BUPIVACAINE HCL (PF) 0.5 % IJ SOLN
INTRAMUSCULAR | Status: AC
  Filled 2022-12-17: qty 30

## 2022-12-17 MED ORDER — VANCOMYCIN HCL 1000 MG IV SOLR
INTRAVENOUS | Status: AC
  Filled 2022-12-17: qty 20

## 2022-12-17 MED ORDER — PROPOFOL 10 MG/ML IV BOLUS
INTRAVENOUS | Status: AC
  Filled 2022-12-17: qty 20

## 2022-12-17 MED ORDER — INSULIN GLARGINE-YFGN 100 UNIT/ML ~~LOC~~ SOLN
15.0000 [IU] | Freq: Every day | SUBCUTANEOUS | Status: DC
  Administered 2022-12-17: 15 [IU] via SUBCUTANEOUS
  Filled 2022-12-17 (×3): qty 0.15

## 2022-12-17 MED ORDER — STERILE WATER FOR IRRIGATION IR SOLN
Status: DC | PRN
  Administered 2022-12-17: 1000 mL

## 2022-12-17 MED ORDER — FENTANYL CITRATE (PF) 100 MCG/2ML IJ SOLN
INTRAMUSCULAR | Status: AC
  Filled 2022-12-17: qty 2

## 2022-12-17 MED ORDER — 0.9 % SODIUM CHLORIDE (POUR BTL) OPTIME
TOPICAL | Status: DC | PRN
  Administered 2022-12-17: 1000 mL

## 2022-12-17 MED ORDER — FENTANYL CITRATE (PF) 100 MCG/2ML IJ SOLN
INTRAMUSCULAR | Status: DC | PRN
  Administered 2022-12-17: 25 ug via INTRAVENOUS

## 2022-12-17 MED ORDER — ONDANSETRON HCL 4 MG/2ML IJ SOLN
INTRAMUSCULAR | Status: DC | PRN
  Administered 2022-12-17: 4 mg via INTRAVENOUS

## 2022-12-17 MED ORDER — ONDANSETRON HCL 4 MG/2ML IJ SOLN
INTRAMUSCULAR | Status: AC
  Filled 2022-12-17: qty 2

## 2022-12-17 MED ORDER — SODIUM CHLORIDE 0.9 % IV SOLN: INTRAVENOUS | Status: DC

## 2022-12-17 MED ORDER — MUPIROCIN 2 % EX OINT: 1.0000 | TOPICAL_OINTMENT | Freq: Two times a day (BID) | CUTANEOUS | Status: DC

## 2022-12-17 MED ORDER — PROPOFOL 10 MG/ML IV BOLUS
INTRAVENOUS | Status: DC | PRN
  Administered 2022-12-17: 20 mg via INTRAVENOUS

## 2022-12-17 SURGICAL SUPPLY — 47 items
BAG COUNTER SPONGE SURGICOUNT (BAG) IMPLANT
BAG SPNG CNTER NS LX DISP (BAG)
BLADE SURG 15 STRL LF DISP TIS (BLADE) IMPLANT
BLADE SURG 15 STRL SS (BLADE)
BNDG CONFORM 4 STRL LF (GAUZE/BANDAGES/DRESSINGS) ×1 IMPLANT
BNDG ELASTIC 4X5.8 VLCR STR LF (GAUZE/BANDAGES/DRESSINGS) IMPLANT
BNDG ELASTIC 6X5.8 VLCR STR LF (GAUZE/BANDAGES/DRESSINGS) ×1 IMPLANT
BNDG GAUZE DERMACEA FLUFF 4 (GAUZE/BANDAGES/DRESSINGS) IMPLANT
BNDG GZE DERMACEA 4 6PLY (GAUZE/BANDAGES/DRESSINGS) ×2
CLEANER TIP ELECTROSURG 2X2 (MISCELLANEOUS) IMPLANT
CNTNR URN SCR LID CUP LEK RST (MISCELLANEOUS) IMPLANT
CONT SPEC 4OZ STRL OR WHT (MISCELLANEOUS) ×1
COVER SURGICAL LIGHT HANDLE (MISCELLANEOUS) ×1 IMPLANT
CUFF TOURN SGL QUICK 24 (TOURNIQUET CUFF)
CUFF TRNQT CYL 24X4X16.5-23 (TOURNIQUET CUFF) IMPLANT
DRAPE INCISE 23X17 IOBAN STRL (DRAPES) ×2
DRAPE INCISE 23X17 STRL (DRAPES) IMPLANT
DRAPE INCISE IOBAN 23X17 STRL (DRAPES) ×2 IMPLANT
GAUZE SPONGE 4X4 12PLY STRL (GAUZE/BANDAGES/DRESSINGS) ×1 IMPLANT
GAUZE XEROFORM 1X8 LF (GAUZE/BANDAGES/DRESSINGS) ×1 IMPLANT
GLOVE BIO SURGEON STRL SZ7 (GLOVE) IMPLANT
GLOVE BIO SURGEON STRL SZ7.5 (GLOVE) ×1 IMPLANT
GLOVE BIO SURGEON STRL SZ8 (GLOVE) IMPLANT
GLOVE BIOGEL M 7.0 STRL (GLOVE) ×1 IMPLANT
GLOVE BIOGEL PI IND STRL 7.5 (GLOVE) ×1 IMPLANT
GLOVE BIOGEL PI IND STRL 8 (GLOVE) ×1 IMPLANT
GLOVE ECLIPSE 8.0 STRL XLNG CF (GLOVE) ×1 IMPLANT
GOWN STRL REUS W/ TWL XL LVL3 (GOWN DISPOSABLE) ×1 IMPLANT
GOWN STRL REUS W/TWL XL LVL3 (GOWN DISPOSABLE) ×2
KIT BASIN OR (CUSTOM PROCEDURE TRAY) ×1 IMPLANT
NDL HYPO 25X1 1.5 SAFETY (NEEDLE) ×1 IMPLANT
NEEDLE HYPO 25X1 1.5 SAFETY (NEEDLE) ×1 IMPLANT
PACK ORTHO EXTREMITY (CUSTOM PROCEDURE TRAY) ×1 IMPLANT
PADDING UNDERCAST 2X4 STRL (CAST SUPPLIES) ×1 IMPLANT
PENCIL SMOKE EVACUATOR (MISCELLANEOUS) ×1 IMPLANT
SPIKE FLUID TRANSFER (MISCELLANEOUS) IMPLANT
STAPLER VISISTAT 35W (STAPLE) ×1 IMPLANT
SUT ETHILON 3 0 PS 1 (SUTURE) IMPLANT
SUT ETHILON 4 0 PS 2 18 (SUTURE) ×1 IMPLANT
SUT MNCRL AB 3-0 PS2 18 (SUTURE) IMPLANT
SUT MNCRL AB 4-0 PS2 18 (SUTURE) IMPLANT
SUT VIC AB 4-0 PS2 27 (SUTURE) ×1 IMPLANT
SYR 20ML LL LF (SYRINGE) IMPLANT
TOWEL OR 17X26 10 PK STRL BLUE (TOWEL DISPOSABLE) ×1 IMPLANT
TOWEL OR NON WOVEN STRL DISP B (DISPOSABLE) ×1 IMPLANT
TUBING CONNECTING 10 (TUBING) IMPLANT
UNDERPAD 30X36 HEAVY ABSORB (UNDERPADS AND DIAPERS) ×2 IMPLANT

## 2022-12-17 NOTE — Progress Notes (Signed)
Orthopedic Tech Progress Note Patient Details:  Seth [name] 26-Sep-1941 [id number]  Ortho Devices Type of Ortho Device: Postop shoe/boot Ortho Device/Splint Location: BLE Ortho Device/Splint Interventions: Ordered   Post Interventions Patient Tolerated: Other (comment) Instructions Provided: Other (comment)  [name] 12/17/2022, 5:16 PM

## 2022-12-17 NOTE — Anesthesia Procedure Notes (Signed)
Procedure Name: MAC Date/Time: 12/17/2022 2:12 PM  Performed by: Niel Hummer, CRNAPre-anesthesia Checklist: Patient identified, Emergency Drugs available, Suction available and Patient being monitored Oxygen Delivery Method: Simple face mask

## 2022-12-17 NOTE — Progress Notes (Addendum)
PROGRESS NOTE    Seth [name]  KZS:[id number] DOB: [birth year] DOA: 12/14/2022 PCP: Orma Render, NP   Brief Narrative:  81 year old with history of HTN, DM 2, paroxysmal A-fib on Coumadin, HLD, chronic systolic CHF, CAD status post CABG admitted to the hospital for bilateral wounds in his toes which has worsened over the past week limiting his ambulation.  X-ray was suggestive of possible osteomyelitis.  Podiatry was consulted.  Due to concerns of vascular disease, ABI and MRI of bilateral feet were ordered.  Currently on empiric IV vancomycin, cefepime and Flagyl.  Antibiotics were narrowed to vancomycin and Rocephin per infectious disease recommendations.  Vascular team was consulted.  Eventually patient agreed for amputation.  Assessment & Plan:  Principal Problem:   Osteomyelitis ([place]) Active Problems:   Diabetes (HCC)   HTN (hypertension)   PAF (paroxysmal atrial fibrillation) (HCC)   Chronic systolic congestive heart failure (HCC)   Coronary artery disease of bypass graft of native heart with stable angina pectoris (HCC)   Dyslipidemia   AKI (acute kidney injury) ([place])   Bilateral osteomyelitis of great toes - Likely from underlying vascular disease.  X-ray suggestive of possible osteomyelitis, MRI of bilateral feet is consistent with osteomyelitis, ABI shows noncompressible blood vessel, no toe pressures reported.  Seen by podiatry and vascular.  Plans for amputation by podiatry today.  Vascular will be available for angiogram if necessary to help with eventual healing process - Seen by infectious disease-currently on vancomycin and Rocephin  Acute kidney injury -Baseline creatinine 1.4.  Creatinine peaked at 2.35, today 1.82.  Continue IV fluids  History of CAD status post CABG, March 2015 -Chest pain-free.  On aspirin, statin, Coreg.  Lisinopril, torsemide and Aldactone on hold  Congestive heart failure with preserved ejection fraction -Echocardiogram performed in  September 2022 in the chart appears to be poor quality report but it mentions ejection fraction of 60%.  Echocardiogram in 2020 shows EF of 55% with moderate LVH. On aspirin, statin, Coreg, lisinopril, Aldactone and torsemide  History of diabetes mellitus type 2 -Will hold home p.o. medications, on sliding scale, Accu-Chek and long-acting insulin here.  Follows with outpatient endocrinology - A1c-7.9  Paroxysmal atrial fibrillation -He is on Coumadin at home.  Continue Coreg.  IV as needed INR 3.0 received 1 dose of vitamin K.  This morning 2.4 therefore will receive FFP during surgery  Hyperlipidemia -Lipitor  Essential hypertension -Continue home p.o. medications as mentioned above.  IV as needed   DVT prophylaxis: On Coumadin Code Status: Full code Family Communication: Family at bedside  Status is: Inpatient Plans for amputation  Subjective: No complaints doing well.  Family at bedside.  Patient is agreeable for amputation  Examination:  Constitutional: Not in acute distress Respiratory: Clear to auscultation bilaterally Cardiovascular: Normal sinus rhythm, no rubs Abdomen: Nontender nondistended good bowel sounds Musculoskeletal: No edema noted Skin: No rashes seen Neurologic: CN 2-12 grossly intact.  And nonfocal Psychiatric: Normal judgment and insight. Alert and oriented x 3. Normal mood. Bilateral toe dressing in place   Objective: Vitals:   12/16/22 0608 12/16/22 1401 12/16/22 2022 12/17/22 0600  BP: 117/66 (!) 131/46 (!) 129/59 139/61  Pulse: 74 82 78 60  Resp: 14 (!) '22 18 14  '$ Temp: 98.3 F (36.8 C) 98.5 F (36.9 C) 98.4 F (36.9 C) 97.9 F (36.6 C)  TempSrc: Oral Oral Oral Oral  SpO2: 97% 92% 94% 95%  Weight:      Height:  Intake/Output Summary (Last 24 hours) at 12/17/2022 1127 Last data filed at 12/16/2022 1500 Gross per 24 hour  Intake 290 ml  Output --  Net 290 ml   Filed Weights   12/14/22 2019  Weight: 97.5 kg     Data  Reviewed:   CBC: Recent Labs  Lab 12/14/22 2033 12/16/22 0553 12/17/22 0739  WBC 13.3* 8.9 9.0  NEUTROABS 10.4*  --   --   HGB 11.6* 10.1* 8.2*  HCT 35.4* 32.0* 25.6*  MCV 87.4 89.9 89.5  PLT 176 143* 188*   Basic Metabolic Panel: Recent Labs  Lab 12/14/22 2033 12/15/22 0840 12/16/22 0553 12/17/22 0739  NA 133* 136 137 135  K 4.5 3.9 3.6 4.0  CL 99 104 107 106  CO2 21* 22 20* 20*  GLUCOSE 210* 151* 164* 148*  BUN 49* 48* 44* 32*  CREATININE 2.35* 2.07* 1.90* 1.82*  CALCIUM 8.9 8.8* 8.5* 8.1*  MG  --   --   --  1.8   GFR: Estimated Creatinine Clearance: 37.9 mL/min (A) (by C-G formula based on SCr of 1.82 mg/dL (H)). Liver Function Tests: Recent Labs  Lab 12/14/22 2033 12/16/22 0553  AST 29 24  ALT 21 21  ALKPHOS 61 47  BILITOT 0.8 0.6  PROT 8.1 6.8  ALBUMIN 3.2* 2.7*   No results for input(s): "LIPASE", "AMYLASE" in the last 168 hours. No results for input(s): "AMMONIA" in the last 168 hours. Coagulation Profile: Recent Labs  Lab 12/14/22 2033 12/15/22 0840 12/16/22 0553 12/17/22 0739  INR 2.3* 2.6* 3.0* 2.4*   Cardiac Enzymes: No results for input(s): "CKTOTAL", "CKMB", "CKMBINDEX", "TROPONINI" in the last 168 hours. BNP (last 3 results) No results for input(s): "PROBNP" in the last 8760 hours. HbA1C: Recent Labs    12/16/22 0553  HGBA1C 7.9*   CBG: Recent Labs  Lab 12/16/22 0750 12/16/22 1131 12/16/22 1545 12/16/22 2149 12/17/22 0735  GLUCAP 176* 271* 211* 189* 147*   Lipid Profile: No results for input(s): "CHOL", "HDL", "LDLCALC", "TRIG", "CHOLHDL", "LDLDIRECT" in the last 72 hours. Thyroid Function Tests: No results for input(s): "TSH", "T4TOTAL", "FREET4", "T3FREE", "THYROIDAB" in the last 72 hours. Anemia Panel: No results for input(s): "VITAMINB12", "FOLATE", "FERRITIN", "TIBC", "IRON", "RETICCTPCT" in the last 72 hours. Sepsis Labs: No results for input(s): "PROCALCITON", "LATICACIDVEN" in the last 168 hours.  No results  found for this or any previous visit (from the past 240 hour(s)).       Radiology Studies: VAS [place] ABI WITH/WO TBI  Result Date: 12/16/2022  LOWER EXTREMITY DOPPLER STUDY Patient Name:  ROSEMARY PENTECOST  Date of Exam:   12/16/2022 Medical Rec #: [id number]        Accession #:    [id number] Date of Birth: October 22, 1941         Patient Gender: M Patient Age:   60 years Exam Location:  [facility] Procedure:      VAS [place] ABI WITH/WO TBI Referring Phys: Celesta Gentile --------------------------------------------------------------------------------  Indications: Ulceration. High Risk Factors: Hypertension, hyperlipidemia, Diabetes.  Limitations: Today's exam was limited due to an open wound and bandages. Comparison Study: No prior studies. Performing Technologist: Carlos Levering RVT  Examination Guidelines: A complete evaluation includes at minimum, Doppler waveform signals and systolic blood pressure reading at the level of bilateral brachial, anterior tibial, and posterior tibial arteries, when vessel segments are accessible. Bilateral testing is considered an integral part of a complete examination. Photoelectric Plethysmograph (PPG) waveforms and toe systolic pressure readings are included  as required and additional duplex testing as needed. Limited examinations for reoccurring indications may be performed as noted.  ABI Findings: +---------+------------------+-----+-----------+-------------+ Right    Rt Pressure (mmHg)IndexWaveform   Comment       +---------+------------------+-----+-----------+-------------+ Brachial 155                    triphasic                +---------+------------------+-----+-----------+-------------+ PTA      254               1.56 multiphasic              +---------+------------------+-----+-----------+-------------+ DP       254               1.56 monophasic               +---------+------------------+-----+-----------+-------------+ Great Toe                                   Ulcer/bandage +---------+------------------+-----+-----------+-------------+ +---------+------------------+-----+-----------+-------------+ Left     Lt Pressure (mmHg)IndexWaveform   Comment       +---------+------------------+-----+-----------+-------------+ Brachial 163                    triphasic                +---------+------------------+-----+-----------+-------------+ PTA      254               1.56 multiphasic              +---------+------------------+-----+-----------+-------------+ DP       254               1.56 multiphasic              +---------+------------------+-----+-----------+-------------+ Great Toe                                  Ulcer/bandage +---------+------------------+-----+-----------+-------------+ +-------+-----------+-----------+------------+------------+ ABI/TBIToday's ABIToday's TBIPrevious ABIPrevious TBI +-------+-----------+-----------+------------+------------+ Right  [place]                                             +-------+-----------+-----------+------------+------------+ Left   [place]                                             +-------+-----------+-----------+------------+------------+  Summary: Right: Resting right ankle-brachial index indicates noncompressible right lower extremity arteries. Unable to obtain TBI due to great toe ulceration and bandages. Left: Resting left ankle-brachial index indicates noncompressible left lower extremity arteries. Unable to obtain TBI due to great toe ulceration and bandages. *See table(s) above for measurements and observations.  Electronically signed by Deitra Mayo MD on 12/16/2022 at 3:31:53 PM.    Final    MR FOOT RIGHT W WO CONTRAST  Result Date: 12/16/2022 CLINICAL DATA:  Fall, toe ulcerations EXAM: MRI OF THE RIGHT FOREFOOT WITHOUT AND WITH CONTRAST TECHNIQUE: Multiplanar, multisequence MR imaging of the right forefoot was performed before and  after the administration of intravenous contrast. CONTRAST:  8m GADAVIST GADOBUTROL 1 MMOL/ML IV SOLN COMPARISON:  Right forefoot 02/14/2022 FINDINGS: Bones/Joint/Cartilage There is  marrow edema within the great toe distal phalanx with bone erosion at the medial aspect of the distal phalangeal base and associated low T1 signal (sagittal stir image 8, coronal T1 image 13). There is associated mild enhancement. There is a hammertoe deformity of the second toe. There is mild navicular-medial cuneiform osteoarthritis. There is mild first MTP and diffuse interphalangeal joint osteoarthritis. Ligaments Intact Lisfranc ligament. Muscles and Tendons Torn flexor hallucis longus tendon with 3 mm retraction from its distal attachment. There is diffuse intramuscular edema and atrophy in the foot, consistent with denervation change. Soft tissues There is a plantar medial soft tissue ulcer along the distal aspect of the great toe. There is great toe soft tissue swelling and soft tissue enhancement. There is no evidence of a well-defined/drainable fluid collection. IMPRESSION: Osteomyelitis involving the medial base of the great toe distal phalanx with adjacent soft tissue ulcer and cellulitis. No evidence of soft tissue abscess. Torn flexor hallucis longus tendon with 3 mm retraction from its distal attachment. Electronically Signed   By: Maurine Simmering M.D.   On: 12/16/2022 10:01   MR FOOT LEFT W WO CONTRAST  Result Date: 12/16/2022 CLINICAL DATA:  Toe ulcerations with redness EXAM: MRI OF THE LEFT FOREFOOT WITHOUT AND WITH CONTRAST TECHNIQUE: Multiplanar, multisequence MR imaging of the left forefoot was performed both before and after administration of intravenous contrast. CONTRAST:  57m GADAVIST GADOBUTROL 1 MMOL/ML IV SOLN COMPARISON:  Radiograph 12/14/2022 FINDINGS: Bones/Joint/Cartilage There is marrow edema within the medial aspect of the great toe distal phalanx with associated bony erosion and low T1 marrow signal  (sagittal stir image 7, coronal T1 image 10). There is associated enhancement. There is marrow edema and confluent low T1 signal within the distal phalangeal tuft of the second toe distal phalanx (sagittal stir image 15, coronal T1 image 13). There is associated enhancement. There is no other significant marrow signal alteration. There is mild first MTP and diffuse interphalangeal joint osteoarthritis. Ligaments Intact Lisfranc ligament. Muscles and Tendons Diffuse intramuscular edema and muscle atrophy in the foot, compatible with denervation change. Soft tissues Diffuse soft tissue swelling of the foot with soft tissue enhancement of the great toe and second toe. There is a soft tissue ulcer along the medial aspect of the distal great toe and plantar medial aspect of the distal second toe. There is no evidence of a well-defined/drainable fluid collection. IMPRESSION: Osteomyelitis involving the distal phalanges of the first and second toes, with adjacent soft tissue ulcers and cellulitis. No evidence of soft tissue abscess. Electronically Signed   By: JMaurine SimmeringM.D.   On: 12/16/2022 09:54   [place]  Result Date: 12/15/2022 CLINICAL DATA:  Acute kidney injury EXAM: RENAL / URINARY TRACT ULTRASOUND COMPLETE COMPARISON:  MRI abdomen dated 11/26/2020 FINDINGS: Right Kidney: Renal measurements: 12.7 x 5.8 x 6.4 cm = volume: 248 mL. Echogenicity within normal limits. Two lower pole simple cyst measuring 3.3 x 2.7 x 3.3 cm and 2.6 x 2.3 x 2.6 cm, benign. No hydronephrosis. Left Kidney: Renal measurements: 13.3 x 5.4 x 4.8 cm = volume: 180 mL. Echogenicity within normal limits. Two lower pole simple cyst measuring 1.2 x 1.2 x 1.0 cm and 1.1 x 1.1 x 1.2 cm, benign. No hydronephrosis. Bladder: Appears normal for degree of bladder distention. Other: None. IMPRESSION: Bilateral renal cysts, simple/benign.  No follow-up is recommended. No hydronephrosis. Electronically Signed   By: SJulian HyM.D.   On:  12/15/2022 19:35  Scheduled Meds:  sodium chloride   Intravenous Once   sodium chloride   Intravenous Once   ascorbic acid  250 mg Oral Daily   aspirin EC  81 mg Oral Daily   atorvastatin  40 mg Oral Daily   carvedilol  12.5 mg Oral BID WC   citalopram  10 mg Oral Daily   hydrALAZINE  25 mg Oral Q1500   insulin aspart  0-15 Units Subcutaneous TID WC   insulin glargine-yfgn  15 Units Subcutaneous QHS   mupirocin ointment  1 Application Nasal BID   zinc sulfate  220 mg Oral Daily   Continuous Infusions:  sodium chloride 10 mL/hr at 12/16/22 0430   cefTRIAXone (ROCEPHIN)  IV 2 g (12/16/22 2153)   lactated ringers 50 mL/hr at 12/16/22 1612   vancomycin 1,000 mg (12/16/22 1055)     LOS: 2 days   Time spent= 35 mins    Tyronda Vizcarrondo Arsenio Loader, MD [facility]  If 7PM-7AM, please contact night-coverage  12/17/2022, 11:27 AM

## 2022-12-17 NOTE — Brief Op Note (Signed)
12/17/2022  3:24 PM  PATIENT:  Seth [name]  82 y.o. male  PRE-OPERATIVE DIAGNOSIS:  INFECTED LEFT 1ST AND 2ND TOE INFECTED RIGHT 1ST TOE  POST-OPERATIVE DIAGNOSIS:  * No post-op diagnosis entered *  PROCEDURE:  Procedure(s): TOE AMPUTATION LEFT 1ST AND 2ND PARTIAL  RIGHT 1ST PARTIAL (Bilateral)  SURGEON:  Surgeon(s) and Role:    * Tianah Lonardo, Stephan Minister, DPM - Primary  PHYSICIAN ASSISTANT:   ASSISTANTS: none   ANESTHESIA:   local and MAC  EBL:  10cc   BLOOD ADMINISTERED:2 units FFP pre op  DRAINS: none   LOCAL MEDICATIONS USED:  MARCAINE    and Amount: 20 ml  SPECIMEN:  left 1st and 2nd and right 1st for path, bone culture right 1st micro  DISPOSITION OF SPECIMEN:  path/micro  COUNTS:  YES  TOURNIQUET:  * No tourniquets in log *  DICTATION: .Note written in EPIC  PLAN OF CARE: Admit to inpatient   PATIENT DISPOSITION:  PACU - hemodynamically stable.   Delay start of Pharmacological VTE agent (>24hrs) due to surgical blood loss or risk of bleeding: no

## 2022-12-17 NOTE — Progress Notes (Signed)
Brief Pharmacy Note  Patient s/p amputation left 1st and 2nd partial and right 1st partial. He is on chronic warfarin PTA for atrial fibrillation. Per Dr. Sherryle Lis, bleeding in procedure was fine. Okay to restart warfarin 12/24. INR for tomorrow morning and consult to resume warfarin placed. Will follow up tomorrow.  Tawnya Crook, PharmD, BCPS Clinical Pharmacist 12/17/2022 3:45 PM

## 2022-12-17 NOTE — Anesthesia Postprocedure Evaluation (Signed)
Anesthesia Post Note  Patient: Seth [name]  Procedure(s) Performed: TOE AMPUTATION OF LEFT 1ST AND 2ND PARTIAL, AND RIGHT 1ST PARTIAL (Bilateral: First Toe)     Patient location during evaluation: PACU Anesthesia Type: MAC Level of consciousness: awake and alert Pain management: pain level controlled Vital Signs Assessment: post-procedure vital signs reviewed and stable Respiratory status: spontaneous breathing, nonlabored ventilation and respiratory function stable Cardiovascular status: stable and blood pressure returned to baseline Anesthetic complications: no   No notable events documented.  Last Vitals:  Vitals:   12/17/22 1540 12/17/22 1545  BP:  (!) 160/67  Pulse: (!) 57 (!) 56  Resp: (!) 24 19  Temp:    SpO2: 98% 100%    Last Pain:  Vitals:   12/17/22 1545  TempSrc:   PainSc: 0-No pain                 Audry Pili

## 2022-12-17 NOTE — Progress Notes (Signed)
History and Physical Interval Note:  12/17/2022 2:07 PM  Seth [name]  has presented today for surgery, with the diagnosis of osteomyelitis.  The various methods of treatment have been discussed with the patient and family. After consideration of risks, benefits and other options for treatment, the patient has consented to   Procedure(s): TOE AMPUTATION LEFT 1ST AND 2ND PARTIAL  RIGHT 1ST PARTIAL (Bilateral) as a surgical intervention.  The patient's history has been reviewed, patient examined, no change in status, stable for surgery.  I have reviewed the patient's chart and labs.  Questions were answered to the patient's satisfaction.     Criselda Peaches

## 2022-12-17 NOTE — Anesthesia Preprocedure Evaluation (Addendum)
Anesthesia Evaluation  Patient identified by MRN, date of birth, ID band Patient awake    Reviewed: Allergy & Precautions, NPO status , Patient's Chart, lab work & pertinent test results, reviewed documented beta blocker date and time   History of Anesthesia Complications Negative for: history of anesthetic complications  Airway Mallampati: II  TM Distance: >3 FB Neck ROM: Full    Dental  (+) Dental Advisory Given, Missing   Pulmonary neg pulmonary ROS   Pulmonary exam normal        Cardiovascular hypertension, Pt. on medications and Pt. on home beta blockers + CAD, + CABG and + Peripheral Vascular Disease  Normal cardiovascular exam+ dysrhythmias Atrial Fibrillation    '22 TTE - "Overall normal echo"   Neuro/Psych negative neurological ROS  negative psych ROS   GI/Hepatic negative GI ROS, Neg liver ROS,,,  Endo/Other  diabetes, Type 2, Oral Hypoglycemic Agents    Renal/GU CRFRenal disease    Prostate cancer      Musculoskeletal negative musculoskeletal ROS (+)    Abdominal   Peds  Hematology  (+) Blood dyscrasia, anemia  On coumadin    Anesthesia Other Findings   Reproductive/Obstetrics                             Anesthesia Physical Anesthesia Plan  ASA: 3  Anesthesia Plan: MAC   Post-op Pain Management: Tylenol PO (pre-op)* and Regional block*   Induction:   PONV Risk Score and Plan: 1 and Propofol infusion and Treatment may vary due to age or medical condition  Airway Management Planned: Natural Airway and Simple Face Mask  Additional Equipment: None  Intra-op Plan:   Post-operative Plan:   Informed Consent: I have reviewed the patients History and Physical, chart, labs and discussed the procedure including the risks, benefits and alternatives for the proposed anesthesia with the patient or authorized representative who has indicated his/her understanding and  acceptance.       Plan Discussed with: CRNA, Anesthesiologist and Surgeon  Anesthesia Plan Comments:        Anesthesia Quick Evaluation

## 2022-12-17 NOTE — Progress Notes (Signed)
PT Cancellation Note  Patient Details Name: Seth [name] MRN: [id number] DOB: 05-05-1941   Cancelled Treatment:    Reason Eval/Treat Not Completed: Patient at procedure or test/unavailable;    [facility] 12/17/2022, 10:26 AM

## 2022-12-17 NOTE — Transfer of Care (Signed)
Immediate Anesthesia Transfer of Care Note  Patient: Seth [name]  Procedure(s) Performed: TOE AMPUTATION LEFT 1ST AND 2ND PARTIAL  RIGHT 1ST PARTIAL (Bilateral: First Toe)  Patient Location: PACU  Anesthesia Type:MAC  Level of Consciousness: awake, alert , and oriented  Airway & Oxygen Therapy: Patient Spontanous Breathing and Patient connected to face mask oxygen  Post-op Assessment: Report given to RN, Post -op Vital signs reviewed and stable, and Patient moving all extremities X 4  Post vital signs: Reviewed and stable  Last Vitals:  Vitals Value Taken Time  BP 130/59   Temp    Pulse 57 12/17/22 1530  Resp 19 12/17/22 1530  SpO2 96 % 12/17/22 1530  Vitals shown include unvalidated device data.  Last Pain:  Vitals:   12/17/22 1356  TempSrc: Oral  PainSc:          Complications: No notable events documented.

## 2022-12-18 ENCOUNTER — Encounter (HOSPITAL_COMMUNITY): Payer: Self-pay | Admitting: Podiatry

## 2022-12-18 DIAGNOSIS — M86471 Chronic osteomyelitis with draining sinus, right ankle and foot: Secondary | ICD-10-CM | POA: Diagnosis not present

## 2022-12-18 DIAGNOSIS — M86479 Chronic osteomyelitis with draining sinus, unspecified ankle and foot: Secondary | ICD-10-CM | POA: Diagnosis not present

## 2022-12-18 LAB — CBC
HCT: 28.4 % — ABNORMAL LOW (ref 39.0–52.0)
Hemoglobin: 8.9 g/dL — ABNORMAL LOW (ref 13.0–17.0)
MCH: 28.2 pg (ref 26.0–34.0)
MCHC: 31.3 g/dL (ref 30.0–36.0)
MCV: 89.9 fL (ref 80.0–100.0)
Platelets: 133 10*3/uL — ABNORMAL LOW (ref 150–400)
RBC: 3.16 MIL/uL — ABNORMAL LOW (ref 4.22–5.81)
RDW: 13.2 % (ref 11.5–15.5)
WBC: 9.4 10*3/uL (ref 4.0–10.5)
nRBC: 0 % (ref 0.0–0.2)

## 2022-12-18 LAB — GLUCOSE, CAPILLARY
Glucose-Capillary: 142 mg/dL — ABNORMAL HIGH (ref 70–99)
Glucose-Capillary: 180 mg/dL — ABNORMAL HIGH (ref 70–99)

## 2022-12-18 LAB — BASIC METABOLIC PANEL
Anion gap: 6 (ref 5–15)
BUN: 42 mg/dL — ABNORMAL HIGH (ref 8–23)
CO2: 23 mmol/L (ref 22–32)
Calcium: 8.5 mg/dL — ABNORMAL LOW (ref 8.9–10.3)
Chloride: 105 mmol/L (ref 98–111)
Creatinine, Ser: 1.76 mg/dL — ABNORMAL HIGH (ref 0.61–1.24)
GFR, Estimated: 39 mL/min — ABNORMAL LOW (ref >60)
Glucose, Bld: 140 mg/dL — ABNORMAL HIGH (ref 70–99)
Potassium: 3.8 mmol/L (ref 3.5–5.1)
Sodium: 134 mmol/L — ABNORMAL LOW (ref 135–145)

## 2022-12-18 LAB — PROTIME-INR
INR: 1.4 — ABNORMAL HIGH (ref 0.8–1.2)
Prothrombin Time: 17.1 seconds — ABNORMAL HIGH (ref 11.4–15.2)

## 2022-12-18 LAB — MAGNESIUM: Magnesium: 2.3 mg/dL (ref 1.7–2.4)

## 2022-12-18 MED ORDER — DOXYCYCLINE MONOHYDRATE 100 MG PO TABS: 100.0000 mg | ORAL_TABLET | Freq: Two times a day (BID) | ORAL | 0 refills | Status: AC

## 2022-12-18 MED ORDER — AMOXICILLIN-POT CLAVULANATE 875-125 MG PO TABS: 1.0000 | ORAL_TABLET | Freq: Two times a day (BID) | ORAL | 0 refills | Status: DC

## 2022-12-18 MED ORDER — SENNOSIDES-DOCUSATE SODIUM 8.6-50 MG PO TABS: 1.0000 | ORAL_TABLET | Freq: Two times a day (BID) | ORAL | 0 refills | Status: DC | PRN

## 2022-12-18 MED ORDER — VITAMIN C 250 MG PO TABS: 250.0000 mg | ORAL_TABLET | Freq: Every day | ORAL | 0 refills | Status: DC

## 2022-12-18 MED ORDER — ZINC SULFATE 220 (50 ZN) MG PO CAPS: 220.0000 mg | ORAL_CAPSULE | Freq: Every day | ORAL | 0 refills | Status: DC

## 2022-12-18 MED ORDER — VITAMIN C 500 MG PO CAPS: 1.0000 | ORAL_CAPSULE | Freq: Every day | ORAL | 0 refills | Status: DC

## 2022-12-18 MED ORDER — HYDROCODONE-ACETAMINOPHEN 5-325 MG PO TABS: 1.0000 | ORAL_TABLET | Freq: Four times a day (QID) | ORAL | 0 refills | Status: DC | PRN

## 2022-12-18 MED ORDER — CITALOPRAM HYDROBROMIDE 10 MG PO TABS: 10.0000 mg | ORAL_TABLET | Freq: Every day | ORAL | 0 refills | Status: DC

## 2022-12-18 MED ORDER — HYDRALAZINE HCL 25 MG PO TABS: 25.0000 mg | ORAL_TABLET | Freq: Four times a day (QID) | ORAL | Status: DC | PRN

## 2022-12-18 NOTE — Discharge Summary (Signed)
Physician Discharge Summary  LEXX MONTE JOA:[id number] DOB: 05-03-1941 DOA: 12/14/2022  PCP: Orma Render, NP  Admit date: 12/14/2022 Discharge date: 12/18/2022  Admitted From: Home Disposition: Home  Recommendations for Outpatient Follow-up:  Follow up with PCP in 1-2 weeks Please obtain BMP/CBC in one week your next doctors visit.  Will prescribe 10 days of oral Augmentin and doxycycline.  Outpatient follow-up with podiatry, further antibiotics will be adjusted as necessary per their service.  Dressing changes and wound care per podiatry If necessary podiatry will make outpatient vascular referral Pain medication with bowel regimen Currently advised to hold home lisinopril and Aldactone.  Hydralazine to be used as needed.  Once repeat blood work/creatinine is improved, these medications can be resumed. INR check outpatient in next 3-4 days   Discharge Condition: Stable CODE STATUS: Full code Diet recommendation: Heart healthy/diabetic  Brief/Interim Summary: 81 year old with history of HTN, DM 2, paroxysmal A-fib on Coumadin, HLD, chronic systolic CHF, CAD status post CABG admitted to the hospital for bilateral wounds in his toes which has worsened over the past week limiting his ambulation.  X-ray was suggestive of possible osteomyelitis.  Podiatry was consulted.  Due to concerns of vascular disease, ABI and MRI of bilateral feet were ordered.  Currently on empiric IV vancomycin, cefepime and Flagyl.  Antibiotics were narrowed to vancomycin and Rocephin per infectious disease recommendations.  Vascular team was consulted.  Eventually patient agreed for amputation.  Patient underwent bilateral partial toe amputation on 12/23, tolerated procedure well.  Following day cultures grew gram-positive and gram-negative rod.  It was determined empirically to treat patient with Augmentin and doxycycline for 10 days with wound care as directed by podiatry.  Briefly discussed case with Dr. Graylon Good  from infectious disease as well. Patient is medically stable for discharge with recommendations as stated above  Extensive instructions given by me to the family at the time of discharge.   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  ABIs were performed during the hospitalization as well which showed noncompressible vessel.  Eventually patient agreed to proceed with bilateral toe amputation.  Transition to p.o. Augmentin and doxycycline for 10 more days.  Boot/dressing changes per podiatry.  Podiatry will make referral to outpatient vascular if necessary for healing in the future for angiogram.   Acute kidney injury -Baseline creatinine 1.4.  Creatinine trending down, today 1.76.  Advised for repeat blood work in next 5 to 7 days.  Once it has remained stable, resume home Aldactone and lisinopril.  In the meantime use as needed hydralazine   History of CAD status post CABG, March 2015 -Chest pain-free.  On aspirin, statin, Coreg.  Lisinopril, torsemide and Aldactone on hold upon discharge.  Resume once renal function is improved   Congestive heart failure with preserved ejection fraction -Echocardiogram performed in September 2022 in the chart appears to be poor quality report but it mentions ejection fraction of 60%.  Echocardiogram in 2020 shows EF of 55% with moderate LVH. On aspirin, statin, Coreg, lisinopril, Aldactone and torsemide as mentioned above   History  of diabetes mellitus type 2 - Resume home regimen, follow-up outpatient with  endocrinology - A1c-7.9   Paroxysmal atrial fibrillation - On Coumadin at home which had to be reversed for surgery in the hospital.  Today INR is 1.4.  Advised patient to resume his Coumadin tonight.  Repeat INR in next 3-4 days and adjust as necessary   Hyperlipidemia -Lipitor   Essential hypertension -Continue home p.o. medications as mentioned above.  Lisinopril and Aldactone on hold.  As needed hydralazine advised     Assessment and Plan: No notes have been filed under this hospital service. Service: Hospitalist      Body mass index is 30.85 kg/m.       Discharge Diagnoses:  Principal Problem:   Osteomyelitis ([place]) Active Problems:   Diabetes ([place])   HTN (hypertension)   PAF (paroxysmal atrial fibrillation) (HCC)   Chronic systolic congestive heart failure (HCC)   Coronary artery disease of bypass graft of native heart with stable angina pectoris (HCC)   Dyslipidemia   AKI (acute kidney injury) ([place])      Consultations: Podiatry, vascular, infectious disease  Subjective:  Doing well no complaints Discharge Exam: Vitals:   12/17/22 1929 12/18/22 0433  BP: (!) 168/54 (!) 153/52  Pulse: (!) 58 75  Resp: 18 18  Temp: 98.4 F (36.9 C) 98.8 F (37.1 C)  SpO2: 95% 97%   Vitals:   12/17/22 1705 12/17/22 1742 12/17/22 1929 12/18/22 0433  BP: (!) 159/43 (!) 156/55 (!) 168/54 (!) 153/52  Pulse: 61 61 (!) 58 75  Resp: '18 18 18 18  '$ Temp: 98.5 F (36.9 C) 97.8 F (36.6 C) 98.4 F (36.9 C) 98.8 F (37.1 C)  TempSrc: Oral Oral Oral Oral  SpO2: 96% 97% 95% 97%  Weight:      Height:        General: Pt is alert, awake, not in acute distress Cardiovascular: RRR, S1/S2 +, no rubs, no gallops Respiratory: CTA bilaterally, no wheezing, no rhonchi Abdominal: Soft, NT, ND, bowel sounds + Extremities: no edema, no cyanosis Lateral toe dressings noted  Discharge Instructions   Allergies as of 12/18/2022   No Known Allergies      Medication List      STOP taking these medications    spironolactone 25 MG tablet Commonly known as: ALDACTONE   torsemide 20 MG tablet Commonly known as: DEMADEX       TAKE these medications    amoxicillin-clavulanate 875-125 MG tablet Commonly known as: AUGMENTIN Take 1 tablet by mouth 2 (two) times daily.   aspirin EC 81 MG tablet Take 81 mg by mouth daily. Swallow whole. What changed: Another medication with the same name was removed. Continue taking this medication, and follow the directions you see here.   atorvastatin 40 MG tablet Commonly known as: LIPITOR Take 1 tablet (40 mg total) by mouth daily.   carvedilol 12.5 MG tablet Commonly known as: COREG Take 1 tablet (12.5 mg total) by mouth 2 (two) times daily with a meal.   citalopram 10 MG tablet Commonly known as: CELEXA Take 1 tablet (10 mg total) by mouth daily.   dapagliflozin propanediol 10 MG Tabs tablet Commonly known as: Farxiga Take 1 tablet (10 mg total) by mouth daily before breakfast.   Dexcom G6 Receiver Devi 1 Device by Does not apply route as directed.   Dexcom G6 Sensor Misc 1 Device by Does not apply route as directed.   Dexcom G6 Transmitter Misc 1 Device by Does not  apply route as directed.   doxycycline 100 MG tablet Commonly known as: ADOXA Take 1 tablet (100 mg total) by mouth 2 (two) times daily for 10 days.   FLAXSEED OIL PO Take 1 tablet by mouth daily.   glipiZIDE 5 MG tablet Commonly known as: GLUCOTROL Take 0.5 tablets (2.5 mg total) by mouth daily before breakfast.   hydrALAZINE 25 MG tablet Commonly known as: APRESOLINE Take 1 tablet (25 mg total) by mouth every 6 (six) hours as needed. MAY TAKE ADDITIONAL IF SBP >160 What changed:  when to take this reasons to take this additional instructions   HYDROcodone-acetaminophen 5-325 MG tablet Commonly known as: NORCO/VICODIN Take 1-2 tablets by mouth every 6 (six) hours as needed for moderate pain or severe pain.   lisinopril 40 MG  tablet Commonly known as: ZESTRIL Take 1 tablet (40 mg total) by mouth 2 (two) times daily.   metFORMIN 1000 MG tablet Commonly known as: GLUCOPHAGE Take 1,000 mg by mouth 2 (two) times daily with a meal.   MULTIVITAMINS PO Take 1 tablet by mouth daily.   potassium chloride SA 20 MEQ tablet Commonly known as: KLOR-CON M Take 2 tablets (40 mEq total) by mouth daily. What changed: how much to take   senna-docusate 8.6-50 MG tablet Commonly known as: Senokot-S Take 1 tablet by mouth 2 (two) times daily as needed for moderate constipation.   vitamin C 250 MG tablet Commonly known as: ASCORBIC ACID Take 1 tablet (250 mg total) by mouth daily. What changed: You were already taking a medication with the same name, and this prescription was added. Make sure you understand how and when to take each.   Vitamin C 500 MG Caps Take 1 capsule by mouth daily. What changed: Another medication with the same name was added. Make sure you understand how and when to take each.   vitamin E 200 UNIT capsule Take 200 Units by mouth daily.   warfarin 5 MG tablet Commonly known as: COUMADIN Take as directed. If you are unsure how to take this medication, talk to your nurse or doctor. Original instructions: Take 1 to 2 tablets by mouth daily as directed by Anticoagulation Clinic. What changed:  how much to take how to take this when to take this additional instructions   zinc sulfate 220 (50 Zn) MG capsule Take 1 capsule (220 mg total) by mouth daily.        Follow-up Information     Early, Coralee Pesa, NP Follow up in 1 week(s).   Specialty: Nurse Practitioner Contact information: [address] [place] [address] [phone]                No Known Allergies  You were cared for by a hospitalist during your hospital stay. If you have any questions about your discharge medications or the care you received while you were in the hospital after you are discharged, you can  call the unit and asked to speak with the hospitalist on call if the hospitalist that took care of you is not available. Once you are discharged, your primary care physician will handle any further medical issues. Please note that no refills for any discharge medications will be authorized once you are discharged, as it is imperative that you return to your primary care physician (or establish a relationship with a primary care physician if you do not have one) for your aftercare needs so that they can reassess your need for medications and monitor your lab  values.   Procedures/Studies: VAS [place] ABI WITH/WO TBI  Result Date: 12/16/2022  LOWER EXTREMITY DOPPLER STUDY Patient Name:  MAXAMILIAN AMADON  Date of Exam:   12/16/2022 Medical Rec #: [id number]        Accession #:    [id number] Date of Birth: [birth year]         Patient Gender: M Patient Age:   55 years Exam Location[facility]  [facility] Procedure:      VAS [place] ABI WITH/WO TBI Referring Phys: Celesta Gentile --------------------------------------------------------------------------------  Indications: Ulceration. High Risk Factors: Hypertension, hyperlipidemia, Diabetes.  Limitations: Today's exam was limited due to an open wound and bandages. Comparison Study: No prior studies. Performing Technologist: Carlos Levering RVT  Examination Guidelines: A complete evaluation includes at minimum, Doppler waveform signals and systolic blood pressure reading at the level of bilateral brachial, anterior tibial, and posterior tibial arteries, when vessel segments are accessible. Bilateral testing is considered an integral part of a complete examination. Photoelectric Plethysmograph (PPG) waveforms and toe systolic pressure readings are included as required and additional duplex testing as needed. Limited examinations for reoccurring indications may be performed as noted.  ABI Findings: +---------+------------------+-----+-----------+-------------+ Right    Rt Pressure  (mmHg)IndexWaveform   Comment       +---------+------------------+-----+-----------+-------------+ Brachial 155                    triphasic                +---------+------------------+-----+-----------+-------------+ PTA      254               1.56 multiphasic              +---------+------------------+-----+-----------+-------------+ DP       254               1.56 monophasic               +---------+------------------+-----+-----------+-------------+ Great Toe                                  Ulcer/bandage +---------+------------------+-----+-----------+-------------+ +---------+------------------+-----+-----------+-------------+ Left     Lt Pressure (mmHg)IndexWaveform   Comment       +---------+------------------+-----+-----------+-------------+ Brachial 163                    triphasic                +---------+------------------+-----+-----------+-------------+ PTA      254               1.56 multiphasic              +---------+------------------+-----+-----------+-------------+ DP       254               1.56 multiphasic              +---------+------------------+-----+-----------+-------------+ Great Toe                                  Ulcer/bandage +---------+------------------+-----+-----------+-------------+ +-------+-----------+-----------+------------+------------+ ABI/TBIToday's ABIToday's TBIPrevious ABIPrevious TBI +-------+-----------+-----------+------------+------------+ Right  [place]                                             +-------+-----------+-----------+------------+------------+ Left   [place]                                             +-------+-----------+-----------+------------+------------+  Summary: Right: Resting right ankle-brachial index indicates noncompressible right lower extremity arteries. Unable to obtain TBI due to great toe ulceration and bandages. Left: Resting left ankle-brachial index indicates  noncompressible left lower extremity arteries. Unable to obtain TBI due to great toe ulceration and bandages. *See table(s) above for measurements and observations.  Electronically signed by Deitra Mayo MD on 12/16/2022 at 3:31:53 PM.    Final    MR FOOT RIGHT W WO CONTRAST  Result Date: 12/16/2022 CLINICAL DATA:  Fall, toe ulcerations EXAM: MRI OF THE RIGHT FOREFOOT WITHOUT AND WITH CONTRAST TECHNIQUE: Multiplanar, multisequence MR imaging of the right forefoot was performed before and after the administration of intravenous contrast. CONTRAST:  60m GADAVIST GADOBUTROL 1 MMOL/ML IV SOLN COMPARISON:  Right forefoot 02/14/2022 FINDINGS: Bones/Joint/Cartilage There is marrow edema within the great toe distal phalanx with bone erosion at the medial aspect of the distal phalangeal base and associated low T1 signal (sagittal stir image 8, coronal T1 image 13). There is associated mild enhancement. There is a hammertoe deformity of the second toe. There is mild navicular-medial cuneiform osteoarthritis. There is mild first MTP and diffuse interphalangeal joint osteoarthritis. Ligaments Intact Lisfranc ligament. Muscles and Tendons Torn flexor hallucis longus tendon with 3 mm retraction from its distal attachment. There is diffuse intramuscular edema and atrophy in the foot, consistent with denervation change. Soft tissues There is a plantar medial soft tissue ulcer along the distal aspect of the great toe. There is great toe soft tissue swelling and soft tissue enhancement. There is no evidence of a well-defined/drainable fluid collection. IMPRESSION: Osteomyelitis involving the medial base of the great toe distal phalanx with adjacent soft tissue ulcer and cellulitis. No evidence of soft tissue abscess. Torn flexor hallucis longus tendon with 3 mm retraction from its distal attachment. Electronically Signed   By: JMaurine SimmeringM.D.   On: 12/16/2022 10:01   MR FOOT LEFT W WO CONTRAST  Result Date:  12/16/2022 CLINICAL DATA:  Toe ulcerations with redness EXAM: MRI OF THE LEFT FOREFOOT WITHOUT AND WITH CONTRAST TECHNIQUE: Multiplanar, multisequence MR imaging of the left forefoot was performed both before and after administration of intravenous contrast. CONTRAST:  110mGADAVIST GADOBUTROL 1 MMOL/ML IV SOLN COMPARISON:  Radiograph 12/14/2022 FINDINGS: Bones/Joint/Cartilage There is marrow edema within the medial aspect of the great toe distal phalanx with associated bony erosion and low T1 marrow signal (sagittal stir image 7, coronal T1 image 10). There is associated enhancement. There is marrow edema and confluent low T1 signal within the distal phalangeal tuft of the second toe distal phalanx (sagittal stir image 15, coronal T1 image 13). There is associated enhancement. There is no other significant marrow signal alteration. There is mild first MTP and diffuse interphalangeal joint osteoarthritis. Ligaments Intact Lisfranc ligament. Muscles and Tendons Diffuse intramuscular edema and muscle atrophy in the foot, compatible with denervation change. Soft tissues Diffuse soft tissue swelling of the foot with soft tissue enhancement of the great toe and second toe. There is a soft tissue ulcer along the medial aspect of the distal great toe and plantar medial aspect of the distal second toe. There is no evidence of a well-defined/drainable fluid collection. IMPRESSION: Osteomyelitis involving the distal phalanges of the first and second toes, with adjacent soft tissue ulcers and cellulitis. No evidence of soft tissue abscess. Electronically Signed   By: JaMaurine Simmering.D.   On: 12/16/2022 09:54   [place]  Result Date: 12/15/2022 CLINICAL DATA:  Acute kidney injury EXAM: RENAL /  URINARY TRACT ULTRASOUND COMPLETE COMPARISON:  MRI abdomen dated 11/26/2020 FINDINGS: Right Kidney: Renal measurements: 12.7 x 5.8 x 6.4 cm = volume: 248 mL. Echogenicity within normal limits. Two lower pole simple cyst measuring 3.3  x 2.7 x 3.3 cm and 2.6 x 2.3 x 2.6 cm, benign. No hydronephrosis. Left Kidney: Renal measurements: 13.3 x 5.4 x 4.8 cm = volume: 180 mL. Echogenicity within normal limits. Two lower pole simple cyst measuring 1.2 x 1.2 x 1.0 cm and 1.1 x 1.1 x 1.2 cm, benign. No hydronephrosis. Bladder: Appears normal for degree of bladder distention. Other: None. IMPRESSION: Bilateral renal cysts, simple/benign.  No follow-up is recommended. No hydronephrosis. Electronically Signed   By: Julian Hy M.D.   On: 12/15/2022 19:35   DG Foot Complete Left  Result Date: 12/14/2022 CLINICAL DATA:  Wound EXAM: LEFT FOOT - COMPLETE 3+ VIEW COMPARISON:  None Available. FINDINGS: No fracture or malalignment. Vascular calcifications. Ulcer distal great toe medial and plantar surface. No emphysema. Possible mild resorptive change at the base of first distal phalanx. Small erosion volar aspect of the proximal phalanx. Arthritis at the first MTP joint. Mild resorption tuft of second distal phalanx. Possible small erosions at the middle phalanx of the fifth digit. IMPRESSION: 1. Ulcer distal great toe with possible mild resorptive change at the base of the first distal phalanx, possible osteomyelitis. Small erosion volar aspect of the proximal phalanx either inflammatory or infectious. 2. Possible small erosions at the middle phalanx of the fifth digit and tuft of second distal phalanx. Correlate for signs of active infection at these digits. Electronically Signed   By: Donavan Foil M.D.   On: 12/14/2022 21:33   DG Foot Complete Right  Result Date: 12/14/2022 CLINICAL DATA:  Wound EXAM: RIGHT FOOT COMPLETE - 3+ VIEW COMPARISON:  06/10/2016 FINDINGS: No fracture or malalignment. Moderate arthritis at the first MTP joint. Vascular calcifications. Ulcer at the distal great toe on the medial side. There is slight erosive change at the base of the first distal phalanx. IMPRESSION: 1. Soft tissue ulceration at the distal great toe with  slight erosive change at the base of the first distal phalanx, suspicious for osteomyelitis. 2. Moderate arthritis at the first MTP joint. Electronically Signed   By: Donavan Foil M.D.   On: 12/14/2022 21:30     The results of significant diagnostics from this hospitalization (including imaging, microbiology, ancillary and laboratory) are listed below for reference.     Microbiology: Recent Results (from the past 240 hour(s))  Surgical PCR screen     Status: None   Collection Time: 12/17/22 11:35 AM   Specimen: Nasal Mucosa; Nasal Swab  Result Value Ref Range Status   MRSA, PCR NEGATIVE NEGATIVE Final   Staphylococcus aureus NEGATIVE NEGATIVE Final    Comment: (NOTE) The Xpert SA Assay (FDA approved for NASAL specimens in patients 20 years of age and older), is one component of a comprehensive surveillance program. It is not intended to diagnose infection nor to guide or monitor treatment. Performed at [facility] [place] [address], [place], [place] [address]   Aerobic/Anaerobic Culture w Gram Stain (surgical/deep wound)     Status: None (Preliminary result)   Collection Time: 12/17/22  2:34 PM   Specimen: Toe, Right; Amputation  Result Value Ref Range Status   Specimen Description   Final    TOE Performed at [place] [address] [place], [place] [address]    Special Requests   Final  NONE Performed at [facility], [place] [address], [place], [place] [address]    Gram Stain   Final    FEW GRAM NEGATIVE RODS RARE GRAM POSITIVE COCCI IN PAIRS NO WBC SEEN Performed at [facility], [place] [address], [place],  [address]    Culture PENDING  Incomplete   Report Status PENDING  Incomplete     Labs: BNP (last 3 results) No results for input(s): "BNP" in the last 8760 hours. Basic Metabolic Panel: Recent Labs  Lab 12/14/22 2033 12/15/22 0840 12/16/22 0553 12/17/22 0739 12/18/22 0609  NA 133* 136  137 135 134*  K 4.5 3.9 3.6 4.0 3.8  CL 99 104 107 106 105  CO2 21* 22 20* 20* 23  GLUCOSE 210* 151* 164* 148* 140*  BUN 49* 48* 44* 32* 42*  CREATININE 2.35* 2.07* 1.90* 1.82* 1.76*  CALCIUM 8.9 8.8* 8.5* 8.1* 8.5*  MG  --   --   --  1.8 2.3   Liver Function Tests: Recent Labs  Lab 12/14/22 2033 12/16/22 0553  AST 29 24  ALT 21 21  ALKPHOS 61 47  BILITOT 0.8 0.6  PROT 8.1 6.8  ALBUMIN 3.2* 2.7*   No results for input(s): "LIPASE", "AMYLASE" in the last 168 hours. No results for input(s): "AMMONIA" in the last 168 hours. CBC: Recent Labs  Lab 12/14/22 2033 12/16/22 0553 12/17/22 0739 12/18/22 0609  WBC 13.3* 8.9 9.0 9.4  NEUTROABS 10.4*  --   --   --   HGB 11.6* 10.1* 8.2* 8.9*  HCT 35.4* 32.0* 25.6* 28.4*  MCV 87.4 89.9 89.5 89.9  PLT 176 143* 115* 133*   Cardiac Enzymes: No results for input(s): "CKTOTAL", "CKMB", "CKMBINDEX", "TROPONINI" in the last 168 hours. BNP: Invalid input(s): "POCBNP" CBG: Recent Labs  Lab 12/17/22 0735 12/17/22 1232 12/17/22 1534 12/17/22 2136 12/18/22 0722  GLUCAP 147* 158* 126* 197* 142*   D-Dimer No results for input(s): "DDIMER" in the last 72 hours. Hgb A1c Recent Labs    12/16/22 0553  HGBA1C 7.9*   Lipid Profile No results for input(s): "CHOL", "HDL", "LDLCALC", "TRIG", "CHOLHDL", "LDLDIRECT" in the last 72 hours. Thyroid function studies No results for input(s): "TSH", "T4TOTAL", "T3FREE", "THYROIDAB" in the last 72 hours.  Invalid input(s): "FREET3" Anemia work up No results for input(s): "VITAMINB12", "FOLATE", "FERRITIN", "TIBC", "IRON", "RETICCTPCT" in the last 72 hours. Urinalysis    Component Value Date/Time   COLORURINE YELLOW 12/15/2022 0024   APPEARANCEUR CLEAR 12/15/2022 0024   LABSPEC 1.010 12/15/2022 0024   PHURINE 5.0 12/15/2022 0024   GLUCOSEU >=500 (A) 12/15/2022 0024   HGBUR MODERATE (A) 12/15/2022 0024   BILIRUBINUR NEGATIVE 12/15/2022 0024   KETONESUR NEGATIVE 12/15/2022 0024    PROTEINUR 100 (A) 12/15/2022 0024   UROBILINOGEN 1.0 02/23/2014 1734   NITRITE NEGATIVE 12/15/2022 0024   LEUKOCYTESUR NEGATIVE 12/15/2022 0024   Sepsis Labs Recent Labs  Lab 12/14/22 2033 12/16/22 0553 12/17/22 0739 12/18/22 0609  WBC 13.3* 8.9 9.0 9.4   Microbiology Recent Results (from the past 240 hour(s))  Surgical PCR screen     Status: None   Collection Time: 12/17/22 11:35 AM   Specimen: Nasal Mucosa; Nasal Swab  Result Value Ref Range Status   MRSA, PCR NEGATIVE NEGATIVE Final   Staphylococcus aureus NEGATIVE NEGATIVE Final    Comment: (NOTE) The Xpert SA Assay (FDA approved for NASAL specimens in patients 53 years of age and older), is one component of a comprehensive surveillance program.  It is not intended to diagnose infection nor to guide or monitor treatment. Performed at [facility], [place] [address] [place], [place] [address]   Aerobic/Anaerobic Culture w Gram Stain (surgical/deep wound)     Status: None (Preliminary result)   Collection Time: 12/17/22  2:34 PM   Specimen: Toe, Right; Amputation  Result Value Ref Range Status   Specimen Description   Final    TOE Performed at [facility] [address], [place], [place] [address]    Special Requests   Final    NONE Performed at [facility], [place] [address], [place], [place] [address]    Gram Stain   Final    FEW GRAM NEGATIVE RODS RARE GRAM POSITIVE COCCI IN PAIRS NO WBC SEEN Performed at [facility], [place] [address], [place], [place] [address]    Culture PENDING  Incomplete   Report Status PENDING  Incomplete     Time coordinating discharge:  I have spent 35 minutes face to face with the patient and on the ward discussing the patients care, assessment, plan and disposition with other care givers. >50% of the time was devoted counseling the patient about the risks and benefits of treatment/Discharge disposition and coordinating  care.   SIGNED:   Damita Lack, MD  [facility] 12/18/2022, 10:11 AM   If 7PM-7AM, please contact night-coverage

## 2022-12-18 NOTE — Op Note (Signed)
Patient Name: Seth [name] DOB: [birth year]  MRN: [id number]   Date of Service: 12/14/2022 - 12/17/2022  Surgeon: Dr. Lanae Crumbly, DPM Assistants: None Pre-operative Diagnosis:  Diabetic foot infection bilateral Osteomyelitis toes bilateral Post-operative Diagnosis:  Diabetic foot infection bilateral Osteomyelitis toes bilateral Procedures:  1) amputation right great toe partial  2) amputation left great toe partial  3) amputation left 2nd toe partial Pathology/Specimens: ID Type Source Tests Collected by Time Destination  1 : 1) RIGHT FIRST TOE Tissue PATH Digit amputation SURGICAL PATHOLOGY Criselda Peaches, DPM 12/17/2022 1432   2 : 2) LEFT FIRST TOE Tissue PATH Digit amputation SURGICAL PATHOLOGY Criselda Peaches, DPM 12/17/2022 1445   3 : 3) LEFT SECOND TOE Tissue PATH Digit amputation SURGICAL PATHOLOGY Criselda Peaches, DPM 12/17/2022 1446   A : RIGHT GREAT TOE TISSUE (CULTURES) Amputation Toe, Right AEROBIC/ANAEROBIC CULTURE W GRAM STAIN (SURGICAL/DEEP WOUND) Criselda Peaches, DPM 12/17/2022 1434    Anesthesia: MAC Hemostasis: * No tourniquets in log * Estimated Blood Loss: 15 mL Materials: * No implants in log * Medications: 20cc 0.5% marcaine plain Complications: none  Indications for Procedure:  This is a 81 y.o. male with a history of type 2 diabetes and PAD with ulcerations on the bilateral 1st and left 2nd toe. MRI showed osteomyelitis. Partial amputation was recommended. All risks, benefits and potential complications discussed prior to the procedure. All questions addressed. Informed consent signed and reviewed.   Procedure in Detail: Patient was identified in pre-operative holding area. Formal consent was signed and the bilateral lower extremity was marked. Patient was brought back to the operating room. Anesthesia was induced. The extremity was prepped and draped in the usual sterile fashion. Timeout was taken to confirm patient name, laterality, and procedure  prior to incision.   Attention was then directed to the 1st right toe where an incision was made in a fishmouth pattern to encompass the ulceration.. Dissection was carried down to level of bone.  Dissection was continued to the interphalangeal joint and all collateral ligaments were freed at the joint.  The bone soft tissue attachments of the distal phalanx were removed and passed for pathology.  The remaining metatarsal head appeared healthy and viable.  The plantar tissues here showed additional necrosis and poor perfusion. I resected this further until healthy tissue was encountered. This necessitated resection of the proximal phalanx to the level of the base. It was also sent for pathology together. A tissue culture was taken of the necrotic portion. The wound was irrigated and rasped smooth. It was closed with 4-0 Monocryl and 4-0 and 3-0 nylon. An identical but separate procedure was then performed on the left great toe. Less of the proximal phalanx had to be resected here. An identical but separate procedure was then performed on the left second toe. This resection was carried to the distal interphalangeal joint.   The foot was then dressed with xeroform and dry sterile dressings. Patient tolerated the procedure well.   Disposition: Following a period of post-operative monitoring, patient will be transferred to the floor.

## 2022-12-18 NOTE — Progress Notes (Signed)
Orthopedic Tech Progress Note Patient Details:  Seth [name] 08-25-1941 [id number] Patient was given the correct pair of post op shoes.  Patient ID: Seth [name], male   DOB: 1941-09-28, 81 y.o.   MRN: [id number]  [name] 12/18/2022, 9:56 AM

## 2022-12-18 NOTE — Evaluation (Signed)
Physical Therapy Evaluation-1x Patient Details Name: Seth [name] MRN: [id number] DOB: [birth year] Today's Date: 12/18/2022  History of Present Illness  Patient is a 81 year old male who presented with bilateral wound in toes with increased worsening over last week affecting ambulation. Patient was found to have bilateral osteomyelitis of great toes s/p bilateral toe amputation. Patient underwent Left 1st and 2nd toe amputation and right 1st partial on 12/23. PMH: DM II, a fib, HLD, chronic systolic CHF, s/p CABG  Clinical Impression  On eval, pt was Min guard A for mobility. He walked ~150 feet with a RW. He also ascended/descended 5 stairs with use of 1 handrail. Pt denied pain during session. He tolerated activity well. 1x eval. Pt is set to d/c home on today.       Recommendations for follow up therapy are one component of a multi-disciplinary discharge planning process, led by the attending physician.  Recommendations may be updated based on patient status, additional functional criteria and insurance authorization.  Follow Up Recommendations Home health PT      Assistance Recommended at Discharge Frequent or constant Supervision/Assistance  Patient can return home with the following  A little help with walking and/or transfers;A little help with bathing/dressing/bathroom;Assistance with cooking/housework;Assist for transportation;Help with stairs or ramp for entrance    Equipment Recommendations None recommended by PT (family states they have access to a walker)  Recommendations for Other Services       Functional Status Assessment Patient has had a recent decline in their functional status and demonstrates the ability to make significant improvements in function in a reasonable and predictable amount of time.     Precautions / Restrictions Precautions Precautions: Fall Required Braces or Orthoses:  (bil post op shoes) Restrictions Weight Bearing Restrictions: No Other  Position/Activity Restrictions: WBAT bilateral      Mobility  Bed Mobility               General bed mobility comments: oob in recliner    Transfers Overall transfer level: Needs assistance Equipment used: Rolling walker (2 wheels) Transfers: Sit to/from Stand Sit to Stand: Supervision                Ambulation/Gait Ambulation/Gait assistance: Min guard Gait Distance (Feet): 150 Feet Assistive device: Rolling walker (2 wheels) Gait Pattern/deviations: Step-through pattern, Decreased stride length       General Gait Details: Min guard A for safety. Pt tolerated distance well.  Stairs Stairs: Yes Stairs assistance: Min guard Stair Management: Step to pattern, One rail Right Number of Stairs: 5 General stair comments: ascended/descended 5 steps. Pt used 2 hands on 1 handrail. Cues for safety, technique (1 step at a time)  Wheelchair Mobility    Modified Rankin (Stroke Patients Only)       Balance Overall balance assessment: Needs assistance         Standing balance support: Bilateral upper extremity supported, Reliant on assistive device for balance, During functional activity                                 Pertinent Vitals/Pain Pain Assessment Pain Assessment: No/denies pain    Home Living Family/patient expects to be discharged to:: Private residence Living Arrangements: Spouse/significant other Available Help at Discharge: Family;Available 24 hours/day Type of Home: House       Alternate Level Stairs-Number of Steps: 13 Home Layout: Two level Home Equipment: Rolling Walker (2 wheels);Grab bars -  tub/shower      Prior Function Prior Level of Function : Independent/Modified Independent                     Hand Dominance   Dominant Hand: Right    Extremity/Trunk Assessment   Upper Extremity Assessment Upper Extremity Assessment: Defer to OT evaluation    Lower Extremity Assessment Lower Extremity  Assessment: Generalized weakness    Cervical / Trunk Assessment Cervical / Trunk Assessment: Normal  Communication   Communication: HOH (no  hearing aids)  Cognition Arousal/Alertness: Awake/alert Behavior During Therapy: WFL for tasks assessed/performed Overall Cognitive Status: Within Functional Limits for tasks assessed                                          General Comments      Exercises     Assessment/Plan    PT Assessment All further PT needs can be met in the next venue of care [facility])  PT Problem List Decreased mobility;Decreased balance;Decreased knowledge of use of DME;Decreased range of motion;Decreased strength       PT Treatment Interventions Therapeutic activities;Patient/family education;DME instruction;Gait training;Therapeutic exercise;Balance training    PT Goals (Current goals can be found in the Care Plan section)  Acute Rehab PT Goals Patient Stated Goal: home toady PT Goal Formulation: All assessment and education complete, DC therapy    Frequency Min 3X/week     Co-evaluation               AM-PAC PT "6 Clicks" Mobility  Outcome Measure Help needed turning from your back to your side while in a flat bed without using bedrails?: None Help needed moving from lying on your back to sitting on the side of a flat bed without using bedrails?: None Help needed moving to and from a bed to a chair (including a wheelchair)?: None Help needed standing up from a chair using your arms (e.g., wheelchair or bedside chair)?: None Help needed to walk in hospital room?: A Little Help needed climbing 3-5 steps with a railing? : A Little 6 Click Score: 22    End of Session Equipment Utilized During Treatment: Gait belt Activity Tolerance: Patient tolerated treatment well Patient left: in chair;with call bell/phone within reach;with family/visitor present   PT Visit Diagnosis: Difficulty in walking, not elsewhere classified  (R26.2)    Time: 9741-6384 PT Time Calculation (min) (ACUTE ONLY): 17 min   Charges:   PT Evaluation $PT Eval Low Complexity: Isabella, PT Acute Rehabilitation  Office: [phone]

## 2022-12-18 NOTE — Progress Notes (Signed)
Subjective:  Patient ID: Seth [name], male    DOB: September 03, 1941,  MRN: [id number]  Patient resting comfortably in bed. Daughter present at bedside.  Says he feels much better today he is eating breakfast  Negative for chest pain and shortness of breath.  Denies fever chills nausea vomiting Objective:   Vitals:   12/17/22 1929 12/18/22 0433  BP: (!) 168/54 (!) 153/52  Pulse: (!) 58 75  Resp: 18 18  Temp: 98.4 F (36.9 C) 98.8 F (37.1 C)  SpO2: 95% 97%   General AA&O x3. Normal mood and affect.  Vascular PT weakly palpable, non palp DP. Good capillary fill time and warmth  Neurologic Epicritic sensation grossly absent.  Dermatologic Incisions are healing well no signs of infection cellulitis resolving, sutures intact and coapted well  Orthopedic: MMT 5/5 in dorsiflexion, plantarflexion, inversion, and eversion. Normal joint ROM without pain or crepitus.      Summary:  Right: Resting right ankle-brachial index indicates noncompressible right  lower extremity arteries.   Unable to obtain TBI due to great toe ulceration and bandages.  Left: Resting left ankle-brachial index indicates noncompressible left  lower extremity arteries.   Unable to obtain TBI due to great toe ulceration and bandages.  *See table(s) above for measurements and observations.    LEFT MRI IMPRESSION: Osteomyelitis involving the distal phalanges of the first and second toes, with adjacent soft tissue ulcers and cellulitis. No evidence of soft tissue abscess.     Electronically Signed   By: Maurine Simmering M.D.   On: 12/16/2022 09:54  RIGHT MRI  IMPRESSION: Osteomyelitis involving the medial base of the great toe distal phalanx with adjacent soft tissue ulcer and cellulitis. No evidence of soft tissue abscess.   Torn flexor hallucis longus tendon with 3 mm retraction from its distal attachment.     Electronically Signed   By: Maurine Simmering M.D.   On: 12/16/2022 10:01   Results for orders  placed or performed during the hospital encounter of 12/14/22  Surgical PCR screen     Status: None   Collection Time: 12/17/22 11:35 AM   Specimen: Nasal Mucosa; Nasal Swab  Result Value Ref Range Status   MRSA, PCR NEGATIVE NEGATIVE Final   Staphylococcus aureus NEGATIVE NEGATIVE Final    Comment: (NOTE) The Xpert SA Assay (FDA approved for NASAL specimens in patients 45 years of age and older), is one component of a comprehensive surveillance program. It is not intended to diagnose infection nor to guide or monitor treatment. Performed at [facility], [place] [address], [place], [place] [address]   Aerobic/Anaerobic Culture w Gram Stain (surgical/deep wound)     Status: None (Preliminary result)   Collection Time: 12/17/22  2:34 PM   Specimen: Toe, Right; Amputation  Result Value Ref Range Status   Specimen Description   Final    TOE Performed at [facility] [address] [place], [place] [address]    Special Requests   Final    NONE Performed at [facility], [place] [address], [place], [place] [address]    Gram Stain   Final    FEW GRAM NEGATIVE RODS RARE GRAM POSITIVE COCCI IN PAIRS NO WBC SEEN Performed at [facility], [place] [address], [place], [place] [address]    Culture PENDING  Incomplete   Report Status PENDING  Incomplete     Assessment & Plan:  Patient was evaluated and treated and all questions answered.  Diabetic foot infection, osteomyelitis -WBAT in flat postop shoe -Okay to discharge today -Augmentin and doxycycline 10 days at discharge -My office will arrange for outpatient follow-up -No dressing changes needed  Criselda Peaches, DPM  Accessible via secure chat for questions or concerns.

## 2022-12-18 NOTE — Evaluation (Signed)
Occupational Therapy Evaluation Patient Details Name: Seth [name] MRN: [id number] DOB: Jul 23, 1941 Today's Date: 12/18/2022   History of Present Illness Patient is a 81 year old male who presented with bilateral wound in toes with increased worsening over last week affecting ambulation. Patient was found to have bilateral osteomyelitis of great toes s/p bilateral toe amputation. Patient underwent Left 1st and 2nd toe amputation and right 1st partial on 12/23. PMH: DM II, a fib, HLD, chronic systolic CHF, s/p CABG   Clinical Impression   Patient is a 81 year old male who was admitted for above. Patient is eager to transition home with wife today. Patient needed min A to don adult absorbent undergarments and shorts with education on figure four positioning. Wife reported she would be able to assist with this at home. Patient would continue to benefit from skilled OT services at this time while admitted and after d/c to address noted deficits in order to improve overall safety and independence in ADLs.        Recommendations for follow up therapy are one component of a multi-disciplinary discharge planning process, led by the attending physician.  Recommendations may be updated based on patient status, additional functional criteria and insurance authorization.   Follow Up Recommendations  Home health OT     Assistance Recommended at Discharge Frequent or constant Supervision/Assistance  Patient can return home with the following A little help with walking and/or transfers;Assistance with cooking/housework;Direct supervision/assist for medications management;Assist for transportation;Help with stairs or ramp for entrance;Direct supervision/assist for financial management;A little help with bathing/dressing/bathroom    Functional Status Assessment  Patient has had a recent decline in their functional status and demonstrates the ability to make significant improvements in function in a  reasonable and predictable amount of time.  Equipment Recommendations  BSC/3in1       Precautions / Restrictions Precautions Precautions: None Restrictions Other Position/Activity Restrictions: BLE WBAT per orders      Mobility Bed Mobility         General bed mobility comments: patient sitting EOB with family present and returned to the same        Balance Overall balance assessment: Mild deficits observed, not formally tested             ADL either performed or assessed with clinical judgement   ADL Overall ADL's : Needs assistance/impaired Eating/Feeding: Modified independent;Sitting   Grooming: Wash/dry face;Set up;Sitting   Upper Body Bathing: Sitting;Set up   Lower Body Bathing: Sitting/lateral leans;Set up Lower Body Bathing Details (indicate cue type and reason): able to figure four position BLE sitting EOB Upper Body Dressing : Set up;Sitting   Lower Body Dressing: Min guard;Sitting/lateral leans;Sit to/from stand Lower Body Dressing Details (indicate cue type and reason): patient did need min A to don adult absorbent undergarment over BLE sitting on lower commode seat. Toilet Transfer: Supervision/safety;Ambulation;Rolling walker (2 wheels) Toilet Transfer Details (indicate cue type and reason): cues to reach back and not plop. wife reported that husband plops to sit down all the time,. Toileting- Water quality scientist and Hygiene: Min guard;Sit to/from stand Toileting - Clothing Manipulation Details (indicate cue type and reason): with RW       General ADL Comments: noted to have some red discharge on bilateral shoes after toileting in room. nurse made aware.     Vision Patient Visual Report: No change from baseline              Pertinent Vitals/Pain Pain Assessment Pain Assessment: No/denies pain ("  i had a pain pill")     Hand Dominance Right   Extremity/Trunk Assessment Upper Extremity Assessment Upper Extremity Assessment: Overall  WFL for tasks assessed   Lower Extremity Assessment Lower Extremity Assessment: Defer to PT evaluation   Cervical / Trunk Assessment Cervical / Trunk Assessment: Normal   Communication Communication Communication: HOH (no  hearing aids)   Cognition Arousal/Alertness: Awake/alert Behavior During Therapy: WFL for tasks assessed/performed Overall Cognitive Status: Within Functional Limits for tasks assessed           General Comments: wife and other family in room as well during session.                Home Living Family/patient expects to be discharged to:: Private residence Living Arrangements: Spouse/significant other Available Help at Discharge: Family;Available 24 hours/day Type of Home: House       Home Layout: Two level Alternate Level Stairs-Number of Steps: 13 Alternate Level Stairs-Rails: Right;Left Bathroom Shower/Tub: Walk-in shower         Home Equipment: Conservation officer, nature (2 wheels);Grab bars - tub/shower          Prior Functioning/Environment Prior Level of Function : Independent/Modified Independent              OT Problem List: Decreased knowledge of use of DME or AE;Decreased knowledge of precautions;Decreased safety awareness;Impaired balance (sitting and/or standing)      OT Treatment/Interventions: Self-care/ADL training;Energy conservation;Therapeutic exercise;DME and/or AE instruction;Therapeutic activities;Patient/family education;Balance training    OT Goals(Current goals can be found in the care plan section) Acute Rehab OT Goals Patient Stated Goal: to go home today OT Goal Formulation: With patient/family Time For Goal Achievement: 01/01/23 Potential to Achieve Goals: Fair  OT Frequency: Min 2X/week       AM-PAC OT "6 Clicks" Daily Activity     Outcome Measure Help from another person eating meals?: None Help from another person taking care of personal grooming?: None Help from another person toileting, which includes  using toliet, bedpan, or urinal?: A Little Help from another person bathing (including washing, rinsing, drying)?: A Little Help from another person to put on and taking off regular upper body clothing?: None Help from another person to put on and taking off regular lower body clothing?: A Little 6 Click Score: 21   End of Session Equipment Utilized During Treatment: Gait belt;Rolling walker (2 wheels) Nurse Communication: Mobility status;Other (comment) (drainage from surgical sites)  Activity Tolerance: Patient tolerated treatment well Patient left: in bed;with call bell/phone within reach;with family/visitor present;with nursing/sitter in room  OT Visit Diagnosis: Unsteadiness on feet (R26.81);Other abnormalities of gait and mobility (R26.89);Muscle weakness (generalized) (M62.81)                Time: 2482-5003 OT Time Calculation (min): 17 min Charges:  OT General Charges $OT Visit: 1 Visit OT Evaluation $OT Eval Low Complexity: 1 Low  Divya Munshi OTR/L, MS Acute Rehabilitation Department Office# [phone]   Willa Rough 12/18/2022, 10:27 AM

## 2022-12-18 NOTE — Progress Notes (Signed)
Patient discharged to home with family, discharge instructions reviewed with patient and wife who verbalized understanding.  

## 2022-12-19 LAB — PREPARE FRESH FROZEN PLASMA
Unit division: 0
Unit division: 0

## 2022-12-19 LAB — BPAM FFP
Blood Product Expiration Date: 202312282359
Blood Product Expiration Date: 202312282359
ISSUE DATE / TIME: 202312231247
ISSUE DATE / TIME: 202312231713
Unit Type and Rh: 600
Unit Type and Rh: 600

## 2022-12-20 ENCOUNTER — Telehealth: Payer: Self-pay

## 2022-12-20 NOTE — Patient Outreach (Signed)
  Care Coordination TOC Note Transition Care Management Follow-up Telephone Call Date of discharge and from where: 12/18/22-[facility] Dx; "KI, osteomyelitis" How have you been since you were released from the hospital? Call completed with spouse.She voices everything is going well-denies any issues or concerns. She reports that patient is taking abx therapy and no issues. She is monitoring wound and no issues noted. Any questions or concerns? No  Items Reviewed: Did the pt receive and understand the discharge instructions provided? Yes  Medications obtained and verified?  Spouse verbalizes she understands meds and did not need to review them Other? Yes  Any new allergies since your discharge? No  Dietary orders reviewed? Yes Do you have support at home? Yes   Home Care and Equipment/Supplies: Were home health services ordered? not applicable If so, what is the name of the agency? N/A  Has the agency set up a time to come to the patient's home? not applicable Were any new equipment or medical supplies ordered?  No What is the name of the medical supply agency? N/A Were you able to get the supplies/equipment? not applicable Do you have any questions related to the use of the equipment or supplies? No  Functional Questionnaire: (I = Independent and D = Dependent) ADLs: A  Bathing/Dressing- I  Meal Prep- A  Eating- I  Maintaining continence- I  Transferring/Ambulation- I  Managing Meds- A  Follow up appointments reviewed:  PCP Hospital f/u appt confirmed? Yes  Scheduled to see [name] on 12/23/22 @ 2:20pm. [facility] f/u appt confirmed? No  . Are transportation arrangements needed? No  If their condition worsens, is the pt aware to call PCP or go to the Emergency Dept.? Yes Was the patient provided with contact information for the PCP's office or ED? Yes Was to pt encouraged to call back with questions or concerns? Yes  SDOH assessments and  interventions completed:   Yes SDOH Interventions Today    Flowsheet Row Most Recent Value  SDOH Interventions   Food Insecurity Interventions Intervention Not Indicated  Transportation Interventions Intervention Not Indicated       Care Coordination Interventions:  Education provided    Encounter Outcome:  Pt. Visit Completed    Enzo Montgomery, RN,BSN,CCM [place] Management Telephonic Care Management Coordinator Direct Phone: [phone] Toll Free: [phone] Fax: [phone]

## 2022-12-21 ENCOUNTER — Other Ambulatory Visit: Payer: Self-pay | Admitting: Cardiovascular Disease

## 2022-12-21 DIAGNOSIS — E782 Mixed hyperlipidemia: Secondary | ICD-10-CM

## 2022-12-21 LAB — SURGICAL PATHOLOGY

## 2022-12-23 ENCOUNTER — Encounter (HOSPITAL_BASED_OUTPATIENT_CLINIC_OR_DEPARTMENT_OTHER): Payer: Self-pay | Admitting: Family

## 2022-12-23 ENCOUNTER — Ambulatory Visit (INDEPENDENT_AMBULATORY_CARE_PROVIDER_SITE_OTHER): Payer: Medicare Other | Admitting: Family

## 2022-12-23 VITALS — BP 152/52 | HR 65 | Ht 70.0 in | Wt 224.4 lb

## 2022-12-23 DIAGNOSIS — D6859 Other primary thrombophilia: Secondary | ICD-10-CM | POA: Diagnosis not present

## 2022-12-23 DIAGNOSIS — I25118 Atherosclerotic heart disease of native coronary artery with other forms of angina pectoris: Secondary | ICD-10-CM | POA: Diagnosis not present

## 2022-12-23 DIAGNOSIS — I10 Essential (primary) hypertension: Secondary | ICD-10-CM | POA: Diagnosis not present

## 2022-12-23 DIAGNOSIS — I480 Paroxysmal atrial fibrillation: Secondary | ICD-10-CM | POA: Diagnosis not present

## 2022-12-23 DIAGNOSIS — N179 Acute kidney failure, unspecified: Secondary | ICD-10-CM

## 2022-12-23 LAB — AEROBIC/ANAEROBIC CULTURE W GRAM STAIN (SURGICAL/DEEP WOUND)

## 2022-12-23 MED ORDER — HYDRALAZINE HCL 50 MG PO TABS: 50.0000 mg | ORAL_TABLET | Freq: Three times a day (TID) | ORAL | 3 refills | Status: DC

## 2022-12-23 MED ORDER — HYDRALAZINE HCL 25 MG PO TABS: 25.0000 mg | ORAL_TABLET | ORAL | 1 refills | Status: DC | PRN

## 2022-12-23 NOTE — Patient Instructions (Addendum)
Medication Instructions:  Your physician has recommended you make the following change in your medication:   CHANGE Hydralazine to '50mg'$  three times per day  May take Hydralazine '25mg'$  as needed for systolic blood pressure more than 160  We will consider resuming Torsemide and/or Spironolactone based on labs.  *If you need a refill on your cardiac medications before your next appointment, please call your pharmacy*   Lab Work: Your physician recommends that you return for lab work today: CBC, BMP, PT/INR  If you have labs (blood work) drawn today and your tests are completely normal, you will receive your results only by: MyChart Message (if you have [place]) OR A paper copy in the mail If you have any lab test that is abnormal or we need to change your treatment, we will call you to review the results.   Testing/Procedures: None ordered today.  Follow-Up: At [facility], you and your health needs are our priority.  As part of our continuing mission to provide you with exceptional heart care, we have created designated Provider Care Teams.  These Care Teams include your primary Cardiologist (physician) and Advanced Practice Providers (APPs -  Physician Assistants and Nurse Practitioners) who all work together to provide you with the care you need, when you need it.  We recommend signing up for the patient portal called "MyChart".  Sign up information is provided on this After Visit Summary.  MyChart is used to connect with patients for Virtual Visits (Telemedicine).  Patients are able to view lab/test results, encounter notes, upcoming appointments, etc.  Non-urgent messages can be sent to your provider as well.   To learn more about what you can do with MyChart, go to [email or web address].    Your next appointment:   March 2024 with Coletta Memos, NP  Other Instructions  To prevent or reduce lower extremity swelling: Eat a low salt diet. Salt makes the body hold onto  extra fluid which causes swelling. Sit with legs elevated. For example, in the recliner or on an [place].  Wear knee-high compression stockings during the daytime. Ones labeled 15-20 mmHg provide good compression.

## 2022-12-23 NOTE — Progress Notes (Signed)
Office Visit    Patient Name: Seth [name] Date of Encounter: 12/24/2022  PCP:  Orma Render, NP   [place]  Cardiologist:  Lauree Chandler, MD  Advanced Practice Provider:  Deberah Pelton, NP Electrophysiologist:  None      Chief Complaint    Seth [name] is a 81 y.o. male presents today for hospital follow up   Past Medical History    Past Medical History:  Diagnosis Date   Acidosis 07/19/2021   Acute on chronic diastolic (congestive) heart failure ([place]) 09/30/2014   Atrial fibrillation ([place])    a. coumadin;  b. Amiodarone   CAD (coronary artery disease)    a.  Lexiscan Myoview (02/14/14):  Apical, inferolateral scar; ? Mild ischemia; EF 32%.;  b.  LHC (02/21/14):  dLM 100%, LAD 100%, CFX 100%, dRCA 50%. => CABG (L-LAD, S-OM, S-PDA)   Chronic systolic heart failure (HCC)    CKD (chronic kidney disease)    proteinuria   Diabetes mellitus with peripheral autonomic neuropathy ([place]) 09/30/2014   Hemorrhage of gastrointestinal tract, unspecified    Hemorrhage of rectum and anus    History of urinary stone 10/08/2007   HLD (hyperlipidemia)    HTN (hypertension)    Hypertension, benign 06/10/2010   Impotence of organic origin    Ischemic cardiomyopathy    a. Echo (02/17/14):  EF 35-40%, anteroseptal and apical AK, Gr 2 DD, MAC, mod LAE.   Nephrolithiasis    Obesity, unspecified 11/11/2009   Peripheral vascular disease, unspecified ([place])    Prostate cancer ([place])    Proteinuria 09/30/2009   Thrombocytopenia, unspecified ([place])    Tobacco use disorder    Tobacco user 11/11/2009   Type 2 diabetes mellitus with diabetic peripheral angiopathy without gangrene ([place]) 07/19/2021   Type II or unspecified type diabetes mellitus with renal manifestations, uncontrolled(250.42)    Urolithiasis 12/26/1997   Past Surgical History:  Procedure Laterality Date   AMPUTATION TOE Bilateral 12/17/2022   Procedure: TOE AMPUTATION OF LEFT 1ST AND 2ND PARTIAL,  AND RIGHT 1ST PARTIAL;  Surgeon: Criselda Peaches, DPM;  Location: [facility];  Service: Podiatry;  Laterality: Bilateral;   BACK SURGERY     CATARACT EXTRACTION  2010   CLIPPING OF ATRIAL APPENDAGE Left 02/24/2014   Procedure: CLIPPING OF ATRIAL APPENDAGE;  Surgeon: Gaye Pollack, MD;  Location: [place];  Service: Open Heart Surgery;  Laterality: Left;   COLONOSCOPY  2005   Normal   CORONARY ARTERY BYPASS GRAFT N/A 02/24/2014   Procedure: Coronary artery bypass graft times three using left internal mammary artery and left leg greater saphenous vein harvested endoscopically.;  Surgeon: Gaye Pollack, MD;  Location: [facility];  Service: Open Heart Surgery;  Laterality: N/A;   INTRAOPERATIVE TRANSESOPHAGEAL ECHOCARDIOGRAM N/A 02/24/2014   Procedure: INTRAOPERATIVE TRANSESOPHAGEAL ECHOCARDIOGRAM;  Surgeon: Gaye Pollack, MD;  Location: [place] [facility];  Service: Open Heart Surgery;  Laterality: N/A;   LEFT HEART CATHETERIZATION WITH CORONARY ANGIOGRAM N/A 02/21/2014   Procedure: LEFT HEART CATHETERIZATION WITH CORONARY ANGIOGRAM;  Surgeon: Peter M Martinique, MD;  Location: [facility] CATH LAB;  Service: Cardiovascular;  Laterality: N/A;    Allergies  No Known Allergies  History of Present Illness    Seth [name] is a 80 y.o. male with a hx of HTN, Dm2, PAF on Coumadin, HLD, HFrEF, CAD s/p CABG last seen 07/27/22 by Coletta Memos, NP.  Initially seen February 2015 for evaluation of atrial fibrillation and started on  Xarelto.  Stress testing abnormal with apical inferior lateral scar and ischemia.  LVEF 32% by Myoview and by echo of 35-40% with akinesis in mid distal anteroseptal and apical portions of the myocardium.  Cardiac catheterization 02/21/2014 occluded left main, moderate RCA stenosis with left system filling from right to left collaterals.  Underwent CABG times 33/2/15 (LIMA-LAD, SVG-OM, SVG-PDA).  Echocardiogram 6 suspicious 15 LVEF 35-40 pregnant, concentric LVH, mild MR.  Repeat echo 2020 LVEF 52-50%, LVH, no  significant valvular abnormalities.  Furosemide has been changed torsemide for improved diuresis.  Spironolactone was also added.  Admitted 12/20-12/24/23 bilateral wounds in toes with Xray showing osteomyelitis. ABI with noncompressible vessel. Discharged on 10 days of Augmentin and odxycycline per podiatry after 12/23 toe amputation. Torsemide and Aldactone were held due to AKI. Noted that Lisionpril was previously held by PCP due to CKD.  Presents today for follow up with his wife and daughter.  [place] there.  Wife has been monitoring blood pressure at home and is requiring 25 mg of hydralazine nearly every 6 hours for elevated blood pressure.  He reports feeling overall well since discharge with no chest pain, dyspnea.  He is ambulating well despite both his feet remaining wrapped.  EKGs/Labs/Other Studies Reviewed:   The following studies were reviewed today:  ABI 12/16/22  ABI Findings:  +---------+------------------+-----+-----------+-------------+  Right   Rt Pressure (mmHg)IndexWaveform   Comment        +---------+------------------+-----+-----------+-------------+  Brachial 155                    triphasic                 +---------+------------------+-----+-----------+-------------+  PTA     254               1.56 multiphasic               +---------+------------------+-----+-----------+-------------+  DP      254               1.56 monophasic                +---------+------------------+-----+-----------+-------------+  Great Toe                                  Ulcer/bandage  +---------+------------------+-----+-----------+-------------+   +---------+------------------+-----+-----------+-------------+  Left    Lt Pressure (mmHg)IndexWaveform   Comment        +---------+------------------+-----+-----------+-------------+  Brachial 163                     triphasic                 +---------+------------------+-----+-----------+-------------+  PTA     254               1.56 multiphasic               +---------+------------------+-----+-----------+-------------+  DP      254               1.56 multiphasic               +---------+------------------+-----+-----------+-------------+  Great Toe                                  Ulcer/bandage  +---------+------------------+-----+-----------+-------------+   +-------+-----------+-----------+------------+------------+  ABI/TBIToday's ABIToday's TBIPrevious ABIPrevious TBI  +-------+-----------+-----------+------------+------------+  Right [place]                                              +-------+-----------+-----------+------------+------------+  Left  [place]                                              +-------+-----------+-----------+------------+------------+   Summary:  Right: Resting right ankle-brachial index indicates noncompressible right  lower extremity arteries.   Unable to obtain TBI due to great toe ulceration and bandages.  Left: Resting left ankle-brachial index indicates noncompressible left  lower extremity arteries.   Unable to obtain TBI due to great toe ulceration and bandages.  EKG:  EKG is not ordered today.  Recent Labs: 04/12/2022: TSH 2.260 12/16/2022: ALT 21 12/18/2022: Magnesium 2.3 12/23/2022: BUN 25; Creatinine, Ser 1.39; Hemoglobin 10.5; Platelets 248; Potassium 4.7; Sodium 142  Recent Lipid Panel    Component Value Date/Time   CHOL 101 11/12/2021 1438   CHOL 101 06/08/2016 0843   TRIG 208.0 (H) 11/12/2021 1438   HDL 37.10 (L) 11/12/2021 1438   HDL 37 (L) 06/08/2016 0843   CHOLHDL 3 11/12/2021 1438   VLDL 41.6 (H) 11/12/2021 1438   LDLCALC 38 04/13/2021 0947   LDLDIRECT 41.0 11/12/2021 1438    Risk Assessment/Calculations:   CHA2DS2-VASc Score = 6   This indicates a 9.7% annual risk of stroke. The patient's  score is based upon: CHF History: 1 HTN History: 1 Diabetes History: 1 Stroke History: 0 Vascular Disease History: 1 Age Score: 2 Gender Score: 0     Home Medications   Current Meds  Medication Sig   amoxicillin-clavulanate (AUGMENTIN) 875-125 MG tablet Take 1 tablet by mouth 2 (two) times daily.   Ascorbic Acid (VITAMIN C) 500 MG CAPS Take 1 capsule by mouth daily.   aspirin EC 81 MG tablet Take 81 mg by mouth daily. Swallow whole.   atorvastatin (LIPITOR) 40 MG tablet Take 1 tablet by mouth once daily   carvedilol (COREG) 12.5 MG tablet TAKE 1 TABLET BY MOUTH TWICE DAILY WITH A MEAL   citalopram (CELEXA) 10 MG tablet Take 1 tablet (10 mg total) by mouth daily.   Continuous Blood Gluc Receiver (DEXCOM G6 RECEIVER) DEVI 1 Device by Does not apply route as directed.   Continuous Blood Gluc Sensor (DEXCOM G6 SENSOR) MISC 1 Device by Does not apply route as directed.   Continuous Blood Gluc Transmit (DEXCOM G6 TRANSMITTER) MISC 1 Device by Does not apply route as directed.   dapagliflozin propanediol (FARXIGA) 10 MG TABS tablet Take 1 tablet (10 mg total) by mouth daily before breakfast.   doxycycline (ADOXA) 100 MG tablet Take 1 tablet (100 mg total) by mouth 2 (two) times daily for 10 days.   Flaxseed, Linseed, (FLAXSEED OIL PO) Take 1 tablet by mouth daily.   glipiZIDE (GLUCOTROL) 5 MG tablet Take 0.5 tablets (2.5 mg total) by mouth daily before breakfast.   hydrALAZINE (APRESOLINE) 25 MG tablet Take 1 tablet (25 mg total) by mouth as needed (Take as needed for systolic blood pressure >300 up to twice daily).   hydrALAZINE (APRESOLINE) 50 MG tablet Take 1 tablet (50 mg total) by mouth 3 (three) times daily.   Multiple  Vitamin (MULTIVITAMINS PO) Take 1 tablet by mouth daily.   potassium chloride SA (KLOR-CON M) 20 MEQ tablet Take 2 tablets (40 mEq total) by mouth daily. (Patient taking differently: Take 20 mEq by mouth daily.)   senna-docusate (SENOKOT-S) 8.6-50 MG tablet Take 1 tablet  by mouth 2 (two) times daily as needed for moderate constipation.   vitamin C (ASCORBIC ACID) 250 MG tablet Take 1 tablet (250 mg total) by mouth daily.   vitamin E 200 UNIT capsule Take 200 Units by mouth daily.   warfarin (COUMADIN) 5 MG tablet Take 1 to 2 tablets by mouth daily as directed by Anticoagulation Clinic. (Patient taking differently: Take 5-10 mg by mouth as directed. Take 2 tablets (10 mg) on (Sun, Tues, Thurs) & Take 1 tablet (5 mg) all other days as directed by Anticoagulation Clinic.)   zinc sulfate 220 (50 Zn) MG capsule Take 1 capsule (220 mg total) by mouth daily.   [DISCONTINUED] hydrALAZINE (APRESOLINE) 25 MG tablet Take 1 tablet (25 mg total) by mouth every 6 (six) hours as needed. MAY TAKE ADDITIONAL IF SBP >160     Review of Systems      All other systems reviewed and are otherwise negative except as noted above.  Physical Exam    VS:  BP (!) 152/52   Pulse 65   Ht _0  (1.778 m)   Wt 224 lb 6.4 oz (101.8 kg)   SpO2 95%   BMI 32.20 kg/m  , BMI Body mass index is 32.2 kg/m.  Wt Readings from Last 3 Encounters:  12/23/22 224 lb 6.4 oz (101.8 kg)  12/14/22 215 lb (97.5 kg)  07/27/22 218 lb 12.8 oz (99.2 kg)     GEN: Well nourished, well developed, in no acute distress. HEENT: normal. Neck: Supple, no JVD, carotid bruits, or masses. Cardiac: RRR, no murmurs, rubs, or gallops. No clubbing, cyanosis, edema.  Radials/PT 2+ and equal bilaterally.  Respiratory:  Respirations regular and unlabored, clear to auscultation bilaterally. GI: Soft, nontender, nondistended. MS: No deformity or atrophy. Skin: Warm and dry, no rash. Neuro:  Strength and sensation are intact. Psych: Normal affect.  Assessment & Plan    AKI / CKD III- Creatinine peaked during admission at 2.35. Improved to 1.76 prior to discharge. Baseline 1.3-1.4. BMP today. If creatinine function returned to baseline plan to resume Torsemide with repeat labs in a few weeks to consider resuming  Spironolactone at later date.  HTN - BP not at goal. Change Hydralazine to 72m TID. Hydralazine 221mPRN for SBP >160. Continue Carvedilol 12.83m283mID. Lisinopril previously discontinued due to CKD.  ICM / HFrEF - Nonpitting bilateral pretibial edema. Update BMP, as above. If creatinine has improved to normal will plan to resume Torsemide. Encouraged leg elevation. Unable to use compression socks at this time due to recent partial toe amputations.   CAD - Stable with no anginal symptoms. No indication for ischemic evaluation.  GDMT aspirin, carvedilol, atorvastatin.   HLD, LDL goal <70 - Continue atorvastatin. Denies myalgias.   PAF / Hypercoagulable state - NSR on auscultation. Continue carvedilol and warfarin. Denies bleeding complications. INR today.      Disposition: Follow up  March 2024  with ChrLauree ChandlerD or JesColetta MemosP.  Signed, CaiLoel DubonnetP 12/24/2022, 2:30 PM [place]

## 2022-12-24 LAB — BASIC METABOLIC PANEL
BUN/Creatinine Ratio: 18 (ref 10–24)
BUN: 25 mg/dL (ref 8–27)
CO2: 20 mmol/L (ref 20–29)
Calcium: 9.6 mg/dL (ref 8.6–10.2)
Chloride: 108 mmol/L — ABNORMAL HIGH (ref 96–106)
Creatinine, Ser: 1.39 mg/dL — ABNORMAL HIGH (ref 0.76–1.27)
Glucose: 110 mg/dL — ABNORMAL HIGH (ref 70–99)
Potassium: 4.7 mmol/L (ref 3.5–5.2)
Sodium: 142 mmol/L (ref 134–144)
eGFR: 51 mL/min/{1.73_m2} — ABNORMAL LOW (ref >59)

## 2022-12-24 LAB — CBC WITH DIFFERENTIAL/PLATELET
Basophils Absolute: 0.1 10*3/uL (ref 0.0–0.2)
Basos: 1 %
EOS (ABSOLUTE): 0.2 10*3/uL (ref 0.0–0.4)
Eos: 2 %
Hematocrit: 32.5 % — ABNORMAL LOW (ref 37.5–51.0)
Hemoglobin: 10.5 g/dL — ABNORMAL LOW (ref 13.0–17.7)
Immature Grans (Abs): 0.1 10*3/uL (ref 0.0–0.1)
Immature Granulocytes: 1 %
Lymphocytes Absolute: 1.7 10*3/uL (ref 0.7–3.1)
Lymphs: 20 %
MCH: 28.3 pg (ref 26.6–33.0)
MCHC: 32.3 g/dL (ref 31.5–35.7)
MCV: 88 fL (ref 79–97)
Monocytes Absolute: 0.8 10*3/uL (ref 0.1–0.9)
Monocytes: 10 %
Neutrophils Absolute: 5.9 10*3/uL (ref 1.4–7.0)
Neutrophils: 66 %
Platelets: 248 10*3/uL (ref 150–450)
RBC: 3.71 x10E6/uL — ABNORMAL LOW (ref 4.14–5.80)
RDW: 12.6 % (ref 11.6–15.4)
WBC: 8.8 10*3/uL (ref 3.4–10.8)

## 2022-12-24 LAB — PROTIME-INR
INR: 1.7 — ABNORMAL HIGH (ref 0.9–1.2)
Prothrombin Time: 17.6 s — ABNORMAL HIGH (ref 9.1–12.0)

## 2022-12-27 ENCOUNTER — Ambulatory Visit (INDEPENDENT_AMBULATORY_CARE_PROVIDER_SITE_OTHER): Payer: Medicare Other | Admitting: Internal Medicine

## 2022-12-27 ENCOUNTER — Telehealth (HOSPITAL_BASED_OUTPATIENT_CLINIC_OR_DEPARTMENT_OTHER): Payer: Self-pay

## 2022-12-27 DIAGNOSIS — I480 Paroxysmal atrial fibrillation: Secondary | ICD-10-CM | POA: Diagnosis not present

## 2022-12-27 DIAGNOSIS — I10 Essential (primary) hypertension: Secondary | ICD-10-CM

## 2022-12-27 DIAGNOSIS — Z5181 Encounter for therapeutic drug level monitoring: Secondary | ICD-10-CM | POA: Diagnosis not present

## 2022-12-27 MED ORDER — TORSEMIDE 20 MG PO TABS: 20.0000 mg | ORAL_TABLET | Freq: Every day | ORAL | 3 refills | Status: DC

## 2022-12-27 NOTE — Telephone Encounter (Addendum)
Seen by patient Seth [name] on 12/24/2022  2:43 PM; orders placed and follow up mychart sent to patient. Appointment notes updated.   ----- Message from Seth Dubonnet, NP sent at 12/24/2022  1:41 PM EST ----- CBC shows anemia is improving which is great! No evidence of infection. Kidney function has improved to his baseline. Resume Torsemide '20mg'$  daily.   INR of 1.7 with goal of 2-3. Will route to [facility] to see if they would like to many any changes prior to his 01/05/23 Cou[facility]in [facility] visit.   Recommend repeat BMP in 2 weeks for monitoring of renal function. He may have this done the day of his [facility]in Clinic appointment.

## 2022-12-28 ENCOUNTER — Ambulatory Visit (INDEPENDENT_AMBULATORY_CARE_PROVIDER_SITE_OTHER): Payer: Medicare Other

## 2022-12-28 DIAGNOSIS — Z9889 Other specified postprocedural states: Secondary | ICD-10-CM | POA: Diagnosis not present

## 2022-12-28 NOTE — Progress Notes (Signed)
Patient presents today for post op visit # 1, patient of Dr. Sherryle Lis.   POV # 1 DOS 12/17/22 Partial 1st toe Amp Bilateral, L 2nd partial Amp.    Patient presents in their surgical shoes. Denies any falls or injury to the foot. Foot is swollen. No signs of infection. No calf pain or shortness of breath. Bandages dry and intact. Incision is intact.  Taking their pain medication and antibiotic as instructed.      Xrays taken today and reviewed by Dr. Sherryle Lis.    Foot redressed today and placed back in surgical shoes. Reviewed icing and elevation. Patient will follow up with Dr. Sherryle Lis in 1 week for POV# 2.

## 2023-01-03 ENCOUNTER — Ambulatory Visit (INDEPENDENT_AMBULATORY_CARE_PROVIDER_SITE_OTHER): Payer: Medicare Other | Admitting: Nurse Practitioner

## 2023-01-03 ENCOUNTER — Encounter: Payer: Self-pay | Admitting: Nurse Practitioner

## 2023-01-03 VITALS — BP 130/60 | HR 72 | Ht 68.0 in | Wt 220.8 lb

## 2023-01-03 DIAGNOSIS — N179 Acute kidney failure, unspecified: Secondary | ICD-10-CM

## 2023-01-03 DIAGNOSIS — R4586 Emotional lability: Secondary | ICD-10-CM

## 2023-01-03 DIAGNOSIS — E1122 Type 2 diabetes mellitus with diabetic chronic kidney disease: Secondary | ICD-10-CM

## 2023-01-03 DIAGNOSIS — S98139A Complete traumatic amputation of one unspecified lesser toe, initial encounter: Secondary | ICD-10-CM | POA: Diagnosis not present

## 2023-01-03 DIAGNOSIS — I5022 Chronic systolic (congestive) heart failure: Secondary | ICD-10-CM

## 2023-01-03 DIAGNOSIS — N1831 Chronic kidney disease, stage 3a: Secondary | ICD-10-CM | POA: Diagnosis not present

## 2023-01-03 DIAGNOSIS — D696 Thrombocytopenia, unspecified: Secondary | ICD-10-CM

## 2023-01-03 DIAGNOSIS — E1159 Type 2 diabetes mellitus with other circulatory complications: Secondary | ICD-10-CM

## 2023-01-03 DIAGNOSIS — I480 Paroxysmal atrial fibrillation: Secondary | ICD-10-CM

## 2023-01-03 DIAGNOSIS — E611 Iron deficiency: Secondary | ICD-10-CM

## 2023-01-03 DIAGNOSIS — I509 Heart failure, unspecified: Secondary | ICD-10-CM

## 2023-01-03 DIAGNOSIS — Z794 Long term (current) use of insulin: Secondary | ICD-10-CM

## 2023-01-03 DIAGNOSIS — E876 Hypokalemia: Secondary | ICD-10-CM

## 2023-01-03 DIAGNOSIS — I10 Essential (primary) hypertension: Secondary | ICD-10-CM

## 2023-01-03 DIAGNOSIS — I739 Peripheral vascular disease, unspecified: Secondary | ICD-10-CM

## 2023-01-03 LAB — POCT UA - MICROALBUMIN
Albumin/Creatinine Ratio, Urine, POC: 630.5
Creatinine, POC: 37 mg/dL
Microalbumin Ur, POC: 233.3 mg/L

## 2023-01-03 LAB — PROTIME-INR
INR: 2.8 — ABNORMAL HIGH (ref 0.9–1.2)
Prothrombin Time: 27 s — ABNORMAL HIGH (ref 9.1–12.0)

## 2023-01-03 MED ORDER — SPIRONOLACTONE 25 MG PO TABS: 12.5000 mg | ORAL_TABLET | Freq: Every day | ORAL | 3 refills | Status: DC

## 2023-01-03 MED ORDER — DEXCOM G6 RECEIVER DEVI: 1.0000 | 0 refills | Status: DC

## 2023-01-03 MED ORDER — CITALOPRAM HYDROBROMIDE 10 MG PO TABS: 10.0000 mg | ORAL_TABLET | Freq: Every day | ORAL | 3 refills | Status: DC

## 2023-01-03 MED ORDER — POTASSIUM CHLORIDE CRYS ER 20 MEQ PO TBCR: 20.0000 meq | EXTENDED_RELEASE_TABLET | Freq: Every day | ORAL | 3 refills | Status: DC

## 2023-01-03 NOTE — Progress Notes (Signed)
Orma Render, DNP, AGNP-c [facility] [address] [place], [place] [address] [phone]  Subjective:   Seth [name] is a 82 y.o. male presents to day for evaluation of: Recent hospitalization with amputation of toes and chronic medical concerns.  He tells me overall he feels well. He is recovering well from the amputations and is not experiencing any pain due to severe neuropathy. He will be having the sutures removed tomorrow. He will be fitted for shoes following this. He has been off of the antibiotics for a week, he denies symptoms of infection.  His wife reports his at home blood pressure readings are running high and she is concerned about this finding. He has seen cardiology since his discharge and Terie Purser, NP was happy with his lab results. He is off of his spironolactone due to decreased kidney function. His wife would like to know if he can restart this.  His blood sugars are running about 150 fasting and 160-200 at bedtime. He is checking BG AC/HS and is reportedly taking his medication as directed without missed doses. His wife is monitoring his medications at this time as she tells me she is concerned that he was not taking them as he needed to be previously.  He tells me he is overall feel ok. He doesn't have any questions about his medications or regimen at this time.   PMH, Medications, and Allergies reviewed and updated in chart as appropriate.   ROS negative except for what is listed in HPI. Objective:  BP 130/60   Pulse 72   Ht '5\' 8"'$  (1.727 m)   Wt 220 lb 12.8 oz (100.2 kg)   BMI 33.57 kg/m  Physical Exam Vitals and nursing note reviewed.  Constitutional:      Appearance: He is obese.  HENT:     Head: Normocephalic.  Eyes:     Extraocular Movements: Extraocular movements intact.     Pupils: Pupils are equal, round, and reactive to light.  Neck:     Vascular: No carotid bruit.  Cardiovascular:     Rate and Rhythm: Normal rate and  regular rhythm.  Pulmonary:     Effort: Pulmonary effort is normal.     Breath sounds: Normal breath sounds.  Abdominal:     General: There is no distension.     Palpations: Abdomen is soft.     Tenderness: There is no abdominal tenderness. There is no guarding.  Musculoskeletal:     Cervical back: Normal range of motion. No rigidity or tenderness.     Right lower leg: Edema present.     Left lower leg: Edema present.  Lymphadenopathy:     Cervical: No cervical adenopathy.  Skin:    General: Skin is warm and dry.     Capillary Refill: Capillary refill takes less than 2 seconds.  Neurological:     General: No focal deficit present.     Mental Status: He is alert and oriented to person, place, and time.     Sensory: Sensory deficit present.     Motor: Weakness present.     Gait: Gait abnormal.     Comments: Decreased sensation in the lower extremities. Pedal pulses weak bilaterally. Compression stockings in place           Assessment & Plan:   Problem List Items Addressed This Visit     PVD (peripheral vascular disease) ([place])    Chronic.  Recent amputation of toes bilaterally.  Patient does have very  weak pedal pulses present today.  Unable to use skin due to placement of compression stockings and bandages for recent amputation.  He does have an appointment tomorrow to have the sutures removed.  Will avoid removing bandaging and stockings in office today given he will have evaluation in less than 24 hours.  No alarm symptoms are present at this time.      Relevant Medications   spironolactone (ALDACTONE) 25 MG tablet   Other Relevant Orders   CBC with Differential/Platelet   Comprehensive metabolic panel   Hemoglobin A1c   Thrombocytopenia (HCC)    Chronic. Labs pending today for monitoring. No changes at this time.       HTN (hypertension)    List of at home blood pressure readings show elevation at home. In the office, his blood pressure is well controlled.  Unclear  at this time factors causing elevation in blood pressure at home.  Consider possibility that blood pressure cuff at home is not properly calibrated.  We will monitor labs today for evaluation of kidney function to determine if changes in blood pressure medication are needed.  He is having increased swelling in his lower extremities and may possibly benefit from the readdition of spironolactone however we need to hold this unless kidney function is substantially improved.  At this time I do not want to make any changes to his blood pressure medication regimen however I do recommend continued daily blood pressure monitoring.  We will make determination on readdition of spironolactone once labs have been received.      Relevant Medications   spironolactone (ALDACTONE) 25 MG tablet   Continuous Blood Gluc Receiver (DEXCOM G6 RECEIVER) DEVI   Other Relevant Orders   CBC with Differential/Platelet   Comprehensive metabolic panel   Hemoglobin A1c   PAF (paroxysmal atrial fibrillation) (HCC)    Heart rate and rhythm are regular in the office today with no alarm symptoms present.  Patient is chronically anticoagulated with Coumadin.  We will check PT/INR today to ensure dosing is therapeutic.  No changes in medication today.  Patient does have follow-up with Coumadin clinic in the next couple of days.  Will follow.      Relevant Medications   spironolactone (ALDACTONE) 25 MG tablet   Other Relevant Orders   Protime-INR (Completed)   Type 2 diabetes mellitus with vascular disease ([place])    Chronic.  Blood sugars are not well-controlled at this time based on ACHS blood sugar monitoring.  I do feel that he would benefit greatly from continuous glucose monitoring to allow for immediate intervention for elevations and decreases in blood sugar readings.  I do feel that this will help improve monitoring and control. We will not change medications at this time, but recommend working on diet closely with CGM. We can  consider changes in medication management based on BG readings.       Relevant Medications   spironolactone (ALDACTONE) 25 MG tablet   Continuous Blood Gluc Receiver (DEXCOM G6 RECEIVER) DEVI   Other Relevant Orders   CBC with Differential/Platelet   Comprehensive metabolic panel   Hemoglobin A1c   POCT UA - Microalbumin (Completed)   Chronic systolic congestive heart failure (HCC)    Chronic. No alarm sx present at this time. Lungs show no signs of crackles and no evidence of fluid volume overload on evaluation. Will monitor closely. Plan to continue current diuretic treatment but hold spironolactone until his kidney function evaluation has returned.  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 Close monitoring and balance of diuretics and kidney function. Will monitor labs today. He does not appear to be in fluid volume overload at this time. Will make changes based on labs as needed.       Relevant Medications   spironolactone (ALDACTONE) 25 MG tablet   AKI (acute kidney injury) ([place])    Will monitor kidney function today to see if there is improvement in levels. He did have a significant decline during recent hospitalization. Encouraged patient to keep water consumption up and avoid nephrotoxic medication. Will continue to monitor closely.      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 Unable to visualize toes today due to dressings and compression stockings present. At this time I do not feel that it is necessary to remove these as he is having the sutures removed tomorrow. No alarm symptoms are present at this  time.       Type 2 diabetes mellitus with stage 3a chronic kidney disease, with long-term current use of insulin (HCC)   Relevant Medications   Continuous Blood Gluc Receiver (DEXCOM G6 RECEIVER) DEVI   Other Relevant Orders   Hemoglobin A1c   POCT UA - Microalbumin (Completed)   Other Visit Diagnoses     Mood change       Relevant Medications   citalopram (CELEXA) 10 MG tablet   Hypokalemia       Relevant Medications   potassium chloride SA (KLOR-CON M) 20 MEQ tablet   Iron deficiency       Relevant Orders   Iron, TIBC and Ferritin Panel (Completed)         Orma Render, DNP, AGNP-c 01/08/2023  4:48 PM    History, Medications, Surgery, SDOH, and Family History reviewed and updated as appropriate.

## 2023-01-03 NOTE — Patient Instructions (Addendum)
We will check your labs and make sure your kidney function looks better. If this has come back to normal, we will plan to restart the spironolactone at 12.'5mg'$  daily to help with blood sugar. Keep taking the torsemide.   If we add the spironolactone back then hold the hydralazine TID until you know if blood pressures are stable. If blood pressures are still running high, you may add this back PRN. If you are consistently giving this then let me know and we will add it back scheduled. Systolic BP goal less than 160.  Please ask Dr. Kelton Pillar to recheck his kidney function when you see her in February.

## 2023-01-04 ENCOUNTER — Ambulatory Visit (INDEPENDENT_AMBULATORY_CARE_PROVIDER_SITE_OTHER): Payer: BLUE CROSS/BLUE SHIELD | Admitting: Podiatry

## 2023-01-04 DIAGNOSIS — Z9889 Other specified postprocedural states: Secondary | ICD-10-CM

## 2023-01-04 LAB — COMPREHENSIVE METABOLIC PANEL
ALT: 21 IU/L (ref 0–44)
AST: 19 IU/L (ref 0–40)
Albumin/Globulin Ratio: 1.3 (ref 1.2–2.2)
Albumin: 3.8 g/dL (ref 3.8–4.8)
Alkaline Phosphatase: 79 IU/L (ref 44–121)
BUN/Creatinine Ratio: 24 (ref 10–24)
BUN: 35 mg/dL — ABNORMAL HIGH (ref 8–27)
Bilirubin Total: 0.3 mg/dL (ref 0.0–1.2)
CO2: 22 mmol/L (ref 20–29)
Calcium: 9 mg/dL (ref 8.6–10.2)
Chloride: 108 mmol/L — ABNORMAL HIGH (ref 96–106)
Creatinine, Ser: 1.44 mg/dL — ABNORMAL HIGH (ref 0.76–1.27)
Globulin, Total: 2.9 g/dL (ref 1.5–4.5)
Glucose: 129 mg/dL — ABNORMAL HIGH (ref 70–99)
Potassium: 4.2 mmol/L (ref 3.5–5.2)
Sodium: 146 mmol/L — ABNORMAL HIGH (ref 134–144)
Total Protein: 6.7 g/dL (ref 6.0–8.5)
eGFR: 49 mL/min/{1.73_m2} — ABNORMAL LOW (ref >59)

## 2023-01-04 LAB — IRON,TIBC AND FERRITIN PANEL
Ferritin: 118 ng/mL (ref 30–400)
Iron Saturation: 16 % (ref 15–55)
Iron: 34 ug/dL — ABNORMAL LOW (ref 38–169)
Total Iron Binding Capacity: 209 ug/dL — ABNORMAL LOW (ref 250–450)
UIBC: 175 ug/dL (ref 111–343)

## 2023-01-04 LAB — CBC WITH DIFFERENTIAL/PLATELET
Basophils Absolute: 0 10*3/uL (ref 0.0–0.2)
Basos: 1 %
EOS (ABSOLUTE): 0.2 10*3/uL (ref 0.0–0.4)
Eos: 2 %
Hematocrit: 30.9 % — ABNORMAL LOW (ref 37.5–51.0)
Hemoglobin: 9.9 g/dL — ABNORMAL LOW (ref 13.0–17.7)
Immature Grans (Abs): 0 10*3/uL (ref 0.0–0.1)
Immature Granulocytes: 0 %
Lymphocytes Absolute: 1.8 10*3/uL (ref 0.7–3.1)
Lymphs: 20 %
MCH: 27.9 pg (ref 26.6–33.0)
MCHC: 32 g/dL (ref 31.5–35.7)
MCV: 87 fL (ref 79–97)
Monocytes Absolute: 0.9 10*3/uL (ref 0.1–0.9)
Monocytes: 10 %
Neutrophils Absolute: 6 10*3/uL (ref 1.4–7.0)
Neutrophils: 67 %
Platelets: 157 10*3/uL (ref 150–450)
RBC: 3.55 x10E6/uL — ABNORMAL LOW (ref 4.14–5.80)
RDW: 13 % (ref 11.6–15.4)
WBC: 8.9 10*3/uL (ref 3.4–10.8)

## 2023-01-04 LAB — HEMOGLOBIN A1C
Est. average glucose Bld gHb Est-mCnc: 177 mg/dL
Hgb A1c MFr Bld: 7.8 % — ABNORMAL HIGH (ref 4.8–5.6)

## 2023-01-04 NOTE — Progress Notes (Unsigned)
Patient presents today for post op visit # 2, patient of Dr. Sherryle Lis.   POV # 2  DOS 12/23 Partical 1st toe Amp Bilateral, L 2nd partial Amp    Patient presents in their bilateral post-op shoes. Denies any falls or injury to the foot. Foot is slightly swollen. No signs of infection. No calf pain or shortness of breath. Bandages dry and intact. Incision is intact. Sutures were removed today without complication. He tolerated suture removal well.         Dr. Sherryle Lis did take a look at his foot today as well.   Foot redressed today with band aids and placed back in the surgical shoes. Patient will follow up with Dr. Sherryle Lis in 2 weeks for POV# 3 and diabetic shoe measurements.

## 2023-01-05 ENCOUNTER — Ambulatory Visit: Payer: Medicare Other | Attending: Cardiology | Admitting: *Deleted

## 2023-01-05 DIAGNOSIS — Z5181 Encounter for therapeutic drug level monitoring: Secondary | ICD-10-CM | POA: Diagnosis not present

## 2023-01-05 DIAGNOSIS — I4891 Unspecified atrial fibrillation: Secondary | ICD-10-CM

## 2023-01-05 DIAGNOSIS — I480 Paroxysmal atrial fibrillation: Secondary | ICD-10-CM

## 2023-01-05 LAB — POCT INR: INR: 3.2 — AB (ref 2.0–3.0)

## 2023-01-05 NOTE — Patient Instructions (Addendum)
  Description   Today take 1 tablet then resume taking continue 1 tablet daily EXCEPT 2 tablet on Sundays, Tuesdays and Thursdays. Stay consistent with greens each week (2-3 times per week). Recheck INR in 2 weeks. [facility]madin Clinic [phone].

## 2023-01-06 ENCOUNTER — Encounter: Payer: Self-pay | Admitting: Nurse Practitioner

## 2023-01-06 ENCOUNTER — Telehealth: Payer: Self-pay | Admitting: Nurse Practitioner

## 2023-01-06 NOTE — Telephone Encounter (Signed)
Results have not been reviewed, but I will go through these this evening and send a message through MyChart

## 2023-01-06 NOTE — Telephone Encounter (Signed)
Pt wife called, she was asking if the results from his lab work has gotten back from 01/03/23 because she isn't sure if he should start the new medications for him or not. She aks if someone can give her a call asap and let her know if he should start on the medications now or wait.

## 2023-01-08 DIAGNOSIS — S98139A Complete traumatic amputation of one unspecified lesser toe, initial encounter: Secondary | ICD-10-CM | POA: Insufficient documentation

## 2023-01-08 NOTE — Assessment & Plan Note (Signed)
>>  ASSESSMENT AND PLAN FOR TYPE 2 DIABETES MELLITUS WITH VASCULAR DISEASE WRITTEN ON 01/08/2023  4:27 PM BY Alvie Fowles E, NP  Chronic.  Blood sugars are not well-controlled at this time based on ACHS blood sugar monitoring.  I do feel that he would benefit greatly from continuous glucose monitoring to allow for immediate intervention for elevations and decreases in blood sugar readings.  I do feel that this will help improve monitoring and control. We will not change medications at this time, but recommend working on diet closely with CGM. We can consider changes in medication management based on BG readings.

## 2023-01-08 NOTE — Assessment & Plan Note (Signed)
List of at home blood pressure readings show elevation at home. In the office, his blood pressure is well controlled.  Unclear at this time factors causing elevation in blood pressure at home.  Consider possibility that blood pressure cuff at home is not properly calibrated.  We will monitor labs today for evaluation of kidney function to determine if changes in blood pressure medication are needed.  He is having increased swelling in his lower extremities and may possibly benefit from the readdition of spironolactone however we need to hold this unless kidney function is substantially improved.  At this time I do not want to make any changes to his blood pressure medication regimen however I do recommend continued daily blood pressure monitoring.  We will make determination on readdition of spironolactone once labs have been received.

## 2023-01-08 NOTE — Assessment & Plan Note (Signed)
Will monitor kidney function today to see if there is improvement in levels. He did have a significant decline during recent hospitalization. Encouraged patient to keep water consumption up and avoid nephrotoxic medication. Will continue to monitor closely.

## 2023-01-08 NOTE — Assessment & Plan Note (Signed)
Heart rate and rhythm are regular in the office today with no alarm symptoms present.  Patient is chronically anticoagulated with Coumadin.  We will check PT/INR today to ensure dosing is therapeutic.  No changes in medication today.  Patient does have follow-up with Coumadin clinic in the next couple of days.  Will follow.

## 2023-01-08 NOTE — Assessment & Plan Note (Signed)
Chronic.  Recent amputation of toes bilaterally.  Patient does have very weak pedal pulses present today.  Unable to use skin due to placement of compression stockings and bandages for recent amputation.  He does have an appointment tomorrow to have the sutures removed.  Will avoid removing bandaging and stockings in office today given he will have evaluation in less than 24 hours.  No alarm symptoms are present at this time.

## 2023-01-08 NOTE — Assessment & Plan Note (Signed)
Chronic.  Blood sugars are not well-controlled at this time based on ACHS blood sugar monitoring.  I do feel that he would benefit greatly from continuous glucose monitoring to allow for immediate intervention for elevations and decreases in blood sugar readings.  I do feel that this will help improve monitoring and control. We will not change medications at this time, but recommend working on diet closely with CGM. We can consider changes in medication management based on BG readings.

## 2023-01-08 NOTE — Assessment & Plan Note (Signed)
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 Unable to visualize toes today due to dressings and compression stockings present. At this time I do not feel that it is necessary to remove these as he is having the sutures removed tomorrow. No alarm symptoms are present at this time.

## 2023-01-08 NOTE — Assessment & Plan Note (Signed)
Chronic. Labs pending today for monitoring. No changes at this time.

## 2023-01-08 NOTE — Assessment & Plan Note (Signed)
Chronic. Close monitoring and balance of diuretics and kidney function. Will monitor labs today. He does not appear to be in fluid volume overload at this time. Will make changes based on labs as needed.

## 2023-01-08 NOTE — Assessment & Plan Note (Addendum)
>>  ASSESSMENT AND PLAN FOR CHRONIC SYSTOLIC CONGESTIVE HEART FAILURE WRITTEN ON 01/08/2023  4:42 PM BY Mamie Hundertmark E, NP  Chronic. No alarm sx present at this time. Lungs show no signs of crackles and no evidence of fluid volume overload on evaluation. Will monitor closely. Plan to continue current diuretic treatment but hold spironolactone until his kidney function evaluation has returned.   >>ASSESSMENT AND PLAN FOR EDEMA DUE TO CONGESTIVE HEART FAILURE WRITTEN ON 01/08/2023  4:41 PM BY Emira Eubanks E, NP  Chronic. Close monitoring and balance of diuretics and kidney function. Will monitor labs today. He does not appear to be in fluid volume overload at this time. Will make changes based on labs as needed.

## 2023-01-18 ENCOUNTER — Ambulatory Visit (INDEPENDENT_AMBULATORY_CARE_PROVIDER_SITE_OTHER): Payer: Medicare Other

## 2023-01-18 ENCOUNTER — Encounter: Payer: Medicare Other | Admitting: Podiatry

## 2023-01-18 ENCOUNTER — Ambulatory Visit (INDEPENDENT_AMBULATORY_CARE_PROVIDER_SITE_OTHER): Payer: Medicare Other | Admitting: Podiatry

## 2023-01-18 DIAGNOSIS — Z794 Long term (current) use of insulin: Secondary | ICD-10-CM | POA: Diagnosis not present

## 2023-01-18 DIAGNOSIS — L97512 Non-pressure chronic ulcer of other part of right foot with fat layer exposed: Secondary | ICD-10-CM

## 2023-01-18 DIAGNOSIS — E1143 Type 2 diabetes mellitus with diabetic autonomic (poly)neuropathy: Secondary | ICD-10-CM

## 2023-01-18 DIAGNOSIS — S98132A Complete traumatic amputation of one left lesser toe, initial encounter: Secondary | ICD-10-CM

## 2023-01-18 DIAGNOSIS — M2041 Other hammer toe(s) (acquired), right foot: Secondary | ICD-10-CM

## 2023-01-18 DIAGNOSIS — E11621 Type 2 diabetes mellitus with foot ulcer: Secondary | ICD-10-CM | POA: Diagnosis not present

## 2023-01-18 DIAGNOSIS — Z9889 Other specified postprocedural states: Secondary | ICD-10-CM

## 2023-01-18 NOTE — Progress Notes (Signed)
Patient presents to the office today for diabetic shoe and insole measuring.  Patient was measured with brannock device to determine size and width for 1 pair of extra depth shoes and foam casted for 3 pair of insoles.   ABN signed.   Documentation of medical necessity will be sent to patient's treating diabetic doctor to verify and sign.   Patient's diabetic provider: Shamleffer, Melanie Crazier, MD   Shoes and insoles will be ordered at that time and patient will be notified for an appointment for fitting when they arrive.   Patient shoe selection-   1st   Shoe choice:   [id number]  Shoe size ordered: 12.5 W

## 2023-01-19 ENCOUNTER — Ambulatory Visit: Payer: Medicare Other | Attending: Internal Medicine | Admitting: *Deleted

## 2023-01-19 DIAGNOSIS — Z5181 Encounter for therapeutic drug level monitoring: Secondary | ICD-10-CM

## 2023-01-19 DIAGNOSIS — I480 Paroxysmal atrial fibrillation: Secondary | ICD-10-CM

## 2023-01-19 DIAGNOSIS — I4891 Unspecified atrial fibrillation: Secondary | ICD-10-CM | POA: Diagnosis not present

## 2023-01-19 LAB — POCT INR: INR: 3.3 — AB (ref 2.0–3.0)

## 2023-01-19 NOTE — Patient Instructions (Addendum)
Description   Today take 1 tablet of warfarin then start taking 1 tablet daily EXCEPT 2 tablet on Sundays and Thursdays. Stay consistent with greens each week (2-3 times per week). Recheck INR in 3 weeks. Coumadin Clinic [phone].      Green leafy veggies make the number go down. Thickens the blood.

## 2023-01-21 LAB — WOUND CULTURE
MICRO NUMBER:: 14467134
SPECIMEN QUALITY:: ADEQUATE

## 2023-01-22 NOTE — Progress Notes (Signed)
  Subjective:  Patient ID: Seth [name], male    DOB: May 25, 1941,  MRN: [id number]  Chief Complaint  Patient presents with   Routine Post Op    POV #3 DOS 12/23 Partical 1st toe Amp Bilateral, L 2nd partial Amp.      82 y.o. male returns for post-op check.  Overall doing well there is a callus and some drainage on the right second toe  Review of Systems: Negative except as noted in the HPI. Denies N/V/F/Ch.   Objective:  There were no vitals filed for this visit. There is no height or weight on file to calculate BMI. Constitutional Well developed. Well nourished.  Vascular Foot warm and well perfused. Capillary refill normal to all digits.  Calf is soft and supple, no posterior calf or knee pain, negative Homans' sign  Neurologic Normal speech. Oriented to person, place, and time. Epicritic sensation to light touch grossly reduced bilaterally.  Dermatologic Amputation site is well-healed.  Full-thickness ulcer of right second toe with exposed subcutaneous tissue measures 0.8 x 0.6 x 0.3 cm  Orthopedic: He has no tenderness to palpation noted about the surgical site.    Assessment:   1. Diabetic ulcer of toe of right foot associated with type 2 diabetes mellitus, with fat layer exposed ([place])   2. Type 2 diabetes mellitus with diabetic autonomic neuropathy, with long-term current use of insulin ([place])   3. Amputation of toe of left foot ([place])   4. Hammertoe of right foot    Plan:  Patient was evaluated and treated and all questions answered.   Ulcer right second toe -Amputation sites are well-healed -We discussed the etiology and factors that are a part of the wound healing process.  We also discussed the risk of infection both soft tissue and osteomyelitis from open ulceration.  Discussed the risk of limb loss if this happens or worsens. -Debridement as below. -Dressed with Iodosorb, DSD. -Continue home dressing changes daily with 4 x 4 gauze and mupirocin -He was fitted  for extra-depth diabetic shoes with custom multidensity insoles today -Culture of the right second toe was taken  Procedure: Excisional Debridement of Wound Rationale: Removal of non-viable soft tissue from the wound to promote healing.  Anesthesia: none Post-Debridement Wound Measurements: Noted above Type of Debridement: Sharp Excisional Tissue Removed: Non-viable soft tissue Depth of Debridement: subcutaneous tissue. Technique: Sharp excisional debridement to bleeding, viable wound base.  Dressing: Dry, sterile, compression dressing. Disposition: Patient tolerated procedure well.       Return in about 1 month (around 02/18/2023) for at risk diabetic foot care.

## 2023-01-30 ENCOUNTER — Other Ambulatory Visit: Payer: Self-pay | Admitting: Nurse Practitioner

## 2023-01-30 DIAGNOSIS — U071 COVID-19: Secondary | ICD-10-CM

## 2023-01-30 MED ORDER — NIRMATRELVIR/RITONAVIR (PAXLOVID)TABLET: 3.0000 | ORAL_TABLET | Freq: Two times a day (BID) | ORAL | 0 refills | Status: AC

## 2023-02-09 ENCOUNTER — Ambulatory Visit: Payer: Medicare Other

## 2023-02-17 ENCOUNTER — Ambulatory Visit: Payer: Medicare Other

## 2023-02-21 ENCOUNTER — Encounter: Payer: Self-pay | Admitting: Internal Medicine

## 2023-02-21 ENCOUNTER — Ambulatory Visit (INDEPENDENT_AMBULATORY_CARE_PROVIDER_SITE_OTHER): Payer: Medicare Other | Admitting: Internal Medicine

## 2023-02-21 ENCOUNTER — Ambulatory Visit (INDEPENDENT_AMBULATORY_CARE_PROVIDER_SITE_OTHER): Payer: Medicare Other | Admitting: Podiatry

## 2023-02-21 VITALS — BP 136/82 | HR 64 | Ht 68.0 in | Wt 218.0 lb

## 2023-02-21 DIAGNOSIS — E1152 Type 2 diabetes mellitus with diabetic peripheral angiopathy with gangrene: Secondary | ICD-10-CM | POA: Diagnosis not present

## 2023-02-21 DIAGNOSIS — E1165 Type 2 diabetes mellitus with hyperglycemia: Secondary | ICD-10-CM | POA: Diagnosis not present

## 2023-02-21 DIAGNOSIS — N1831 Chronic kidney disease, stage 3a: Secondary | ICD-10-CM | POA: Diagnosis not present

## 2023-02-21 DIAGNOSIS — S98132A Complete traumatic amputation of one left lesser toe, initial encounter: Secondary | ICD-10-CM

## 2023-02-21 DIAGNOSIS — E1129 Type 2 diabetes mellitus with other diabetic kidney complication: Secondary | ICD-10-CM

## 2023-02-21 DIAGNOSIS — E11621 Type 2 diabetes mellitus with foot ulcer: Secondary | ICD-10-CM

## 2023-02-21 DIAGNOSIS — Z794 Long term (current) use of insulin: Secondary | ICD-10-CM | POA: Diagnosis not present

## 2023-02-21 DIAGNOSIS — M2041 Other hammer toe(s) (acquired), right foot: Secondary | ICD-10-CM

## 2023-02-21 DIAGNOSIS — E1122 Type 2 diabetes mellitus with diabetic chronic kidney disease: Secondary | ICD-10-CM

## 2023-02-21 DIAGNOSIS — L97512 Non-pressure chronic ulcer of other part of right foot with fat layer exposed: Secondary | ICD-10-CM | POA: Diagnosis not present

## 2023-02-21 DIAGNOSIS — R809 Proteinuria, unspecified: Secondary | ICD-10-CM

## 2023-02-21 LAB — POCT GLYCOSYLATED HEMOGLOBIN (HGB A1C): Hemoglobin A1C: 6.4 % — AB (ref 4.0–5.6)

## 2023-02-21 NOTE — Progress Notes (Signed)
Name: Seth [name]  Age/ Sex: 82 y.o., male   MRN/ DOB: [id number], February 24, 1941     PCP: Orma Render, NP   Reason for Endocrinology Evaluation: Type 2 Diabetes Mellitus  Initial Endocrine Consultative Visit: 10/09/2019    PATIENT IDENTIFIER: Seth [name] is a 82 y.o. male with a past medical history of T2DM, HTN, PVD, CAD, A.Fib and Prostate Ca ( S/P chemo and radiation). The patient has followed with Endocrinology clinic since 10/09/2019 for consultative assistance with management of his diabetes.      DIABETIC HISTORY:  Seth [name] was diagnosed with T2DM many years ago.  He was prescribed Jardiance in the past, but unable to use due to cost.  hemoglobin A1c has ranged from 6.4% in 2015, peaking at 11.7% in 2020 On his initial visit to our clinic his A1c was 9.7%, he was on glipizide, Toujeo, and metformin.  We switched his Toujeo to Humalog mix, we stopped glipizide and continued Metformin.  Lives with wife    Wife discontinued insulin mix 01/2022 due to hypoglycemia, he was started on glipizide and Farxiga as well as continuing metformin  Metformin discontinued 06/2022 due to low GFR  SUBJECTIVE:   During the last visit (03/16/2022): A1c 7.7%.  We adjusted Humalog mix and continued Metformin  Today (02/21/2023): Seth [name] is here for a follow-up on diabetes management. Accompanied by wife  . He checks his blood sugars multiple times a day through the CGM. The patient has had hypoglycemic episodes since the last clinic visit.  These are mostly occurring during the day but also have been noted overnight.   He was hospitalized 11/2022 for feet wounds and osteomyelitis, podiatry . Pt required IV Abs. S/P bilateral partial amputations   Had a f/u with podiatry 01/18/2023  He is improving his diet , they stopped Glipizide due to hypoglycemia from 10,2023 but restarted after hospitalization  Has recent cold , and COVID recently, denies SON    HOME DIABETES REGIMEN:   Glipizide 5 mg, half a tablet before Breakfast  Farxiga 10 mg daily   Statin: Yes ACE-I/ARB: Yes     CONTINUOUS GLUCOSE MONITORING RECORD INTERPRETATION    Dates of Recording: 2/14-2/27/2024  Sensor description: dexcom  Results statistics:   CGM use % of time 100  Average and SD 151/34  Time in range   89%  % Time Above 180 15  % Time above 250 <1  % Time Below target 0     Glycemic patterns summary: BG's optimal during the night, trend up during the day  Hyperglycemic episodes  postprandial   Hypoglycemic episodes occurred n/a  Overnight periods: stable       DIABETIC COMPLICATIONS: Microvascular complications:  Neuropathy , CKD III, S/P bilateral partial amputations (great toes) Denies:retinopathy  Last eye exam: Completed 12/2019   Macrovascular complications:  CAD, PVD Denies: CVA   HISTORY:  Past Medical History:  Past Medical History:  Diagnosis Date   Acidosis 07/19/2021   Acute on chronic diastolic (congestive) heart failure ([place]) 09/30/2014   Atrial fibrillation (HCC)    a. coumadin;  b. Amiodarone   CAD (coronary artery disease)    a.  Lexiscan Myoview (02/14/14):  Apical, inferolateral scar; ? Mild ischemia; EF 32%.;  b.  LHC (02/21/14):  dLM 100%, LAD 100%, CFX 100%, dRCA 50%. => CABG (L-LAD, S-OM, S-PDA)   Chronic systolic heart failure (HCC)    CKD (chronic kidney disease)    proteinuria   Diabetes mellitus  with peripheral autonomic neuropathy ([place]) 09/30/2014   Hemorrhage of gastrointestinal tract, unspecified    Hemorrhage of rectum and anus    History of urinary stone 10/08/2007   HLD (hyperlipidemia)    HTN (hypertension)    Hypertension, benign 06/10/2010   Impotence of organic origin    Ischemic cardiomyopathy    a. Echo (02/17/14):  EF 35-40%, anteroseptal and apical AK, Gr 2 DD, MAC, mod LAE.   Nephrolithiasis    Obesity, unspecified 11/11/2009   Peripheral vascular disease, unspecified ([place])    Prostate cancer ([place])     Proteinuria 09/30/2009   Thrombocytopenia, unspecified ([place])    Tobacco use disorder    Tobacco user 11/11/2009   Type 2 diabetes mellitus with diabetic peripheral angiopathy without gangrene ([place]) 07/19/2021   Type II or unspecified type diabetes mellitus with renal manifestations, uncontrolled(250.42)    Urolithiasis 12/26/1997   Past Surgical History:  Past Surgical History:  Procedure Laterality Date   AMPUTATION TOE Bilateral 12/17/2022   Procedure: TOE AMPUTATION OF LEFT 1ST AND 2ND PARTIAL, AND RIGHT 1ST PARTIAL;  Surgeon: Criselda Peaches, DPM;  Location: [facility];  Service: Podiatry;  Laterality: Bilateral;   BACK SURGERY     CATARACT EXTRACTION  2010   CLIPPING OF ATRIAL APPENDAGE Left 02/24/2014   Procedure: CLIPPING OF ATRIAL APPENDAGE;  Surgeon: Gaye Pollack, MD;  Location: [facility];  Service: Open Heart Surgery;  Laterality: Left;   COLONOSCOPY  2005   Normal   CORONARY ARTERY BYPASS GRAFT N/A 02/24/2014   Procedure: Coronary artery bypass graft times three using left internal mammary artery and left leg greater saphenous vein harvested endoscopically.;  Surgeon: Gaye Pollack, MD;  Location: [facility];  Service: Open Heart Surgery;  Laterality: N/A;   INTRAOPERATIVE TRANSESOPHAGEAL ECHOCARDIOGRAM N/A 02/24/2014   Procedure: INTRAOPERATIVE TRANSESOPHAGEAL ECHOCARDIOGRAM;  Surgeon: Gaye Pollack, MD;  Location: [facility];  Service: Open Heart Surgery;  Laterality: N/A;   LEFT HEART CATHETERIZATION WITH CORONARY ANGIOGRAM N/A 02/21/2014   Procedure: LEFT HEART CATHETERIZATION WITH CORONARY ANGIOGRAM;  Surgeon: Peter M Martinique, MD;  Location: [facility] CATH LAB;  Service: Cardiovascular;  Laterality: N/A;   Social History:  reports that he has never smoked. He quit smokeless tobacco use about 9 years ago.  His smokeless tobacco use included chew. He reports that he does not drink alcohol and does not use drugs. Family History:  Family History  Problem Relation Age of Onset   Cancer Mother 75        leukemia   Heart disease Father    Heart attack Father 57   Lung cancer Brother    Colon cancer Neg Hx    Rectal cancer Neg Hx      HOME MEDICATIONS: Allergies as of 02/21/2023   No Known Allergies      Medication List        Accurate as of February 21, 2023  1:52 PM. If you have any questions, ask your nurse or doctor.          STOP taking these medications    Dexcom G6 Receiver Devi Stopped by: Dorita Sciara, MD       TAKE these medications    aspirin EC 81 MG tablet Take 81 mg by mouth daily. Swallow whole.   atorvastatin 40 MG tablet Commonly known as: LIPITOR Take 1 tablet by mouth once daily   carvedilol 12.5 MG tablet Commonly known as: COREG TAKE 1 TABLET BY MOUTH TWICE DAILY WITH A  MEAL   citalopram 10 MG tablet Commonly known as: CELEXA Take 1 tablet (10 mg total) by mouth daily.   dapagliflozin propanediol 10 MG Tabs tablet Commonly known as: Farxiga Take 1 tablet (10 mg total) by mouth daily before breakfast.   Dexcom G6 Sensor Misc 1 Device by Does not apply route as directed.   Dexcom G6 Transmitter Misc 1 Device by Does not apply route as directed.   FLAXSEED OIL PO Take 1 tablet by mouth daily.   glipiZIDE 5 MG tablet Commonly known as: GLUCOTROL Take 0.5 tablets (2.5 mg total) by mouth daily before breakfast.   hydrALAZINE 50 MG tablet Commonly known as: APRESOLINE Take 1 tablet (50 mg total) by mouth 3 (three) times daily.   MULTIVITAMINS PO Take 1 tablet by mouth daily.   potassium chloride SA 20 MEQ tablet Commonly known as: KLOR-CON M Take 1 tablet (20 mEq total) by mouth daily.   spironolactone 25 MG tablet Commonly known as: ALDACTONE Take 0.5 tablets (12.5 mg total) by mouth daily.   torsemide 20 MG tablet Commonly known as: DEMADEX Take 1 tablet (20 mg total) by mouth daily. What changed: how much to take   Vitamin C 500 MG Caps Take 1 capsule by mouth daily. What changed: how much to take    vitamin E 200 UNIT capsule Take 200 Units by mouth daily.   warfarin 5 MG tablet Commonly known as: COUMADIN Take as directed by the anticoagulation clinic. If you are unsure how to take this medication, talk to your nurse or doctor. Original instructions: Take 1 to 2 tablets by mouth daily as directed by Anticoagulation Clinic. What changed:  how much to take how to take this when to take this additional instructions   zinc sulfate 220 (50 Zn) MG capsule Take 1 capsule (220 mg total) by mouth daily.         OBJECTIVE:   Vital Signs: BP 136/82 (BP Location: Left Arm, Patient Position: Sitting, Cuff Size: Large)   Pulse 64   Ht '5\' 8"'$  (1.727 m)   Wt 218 lb (98.9 kg)   SpO2 96%   BMI 33.15 kg/m   Wt Readings from Last 3 Encounters:  02/21/23 218 lb (98.9 kg)  01/03/23 220 lb 12.8 oz (100.2 kg)  12/23/22 224 lb 6.4 oz (101.8 kg)     Exam: General: Pt appears well and is in NAD  Lungs: Clear with good BS bilat with no rales, rhonchi, or wheezes  Heart: RRR   Extremities:  Trace  pretibial edema..  Neuro: MS is good with appropriate affect, pt is alert and Ox3       DM foot exam: 07/20/2022   The skin of the feet is without sores or ulcerations. The pedal pulses are 1+ on right and 1+ on left. The sensation is decreased  to a screening 5.07, 10 gram monofilament bilaterally   DATA REVIEWED:  Lab Results  Component Value Date   HGBA1C 6.4 (A) 02/21/2023   HGBA1C 7.8 (H) 01/03/2023   HGBA1C 7.9 (H) 12/16/2022    Latest Reference Range & Units 01/03/23 15:30  Sodium 134 - 144 mmol/L 146 (H)  Potassium 3.5 - 5.2 mmol/L 4.2  Chloride 96 - 106 mmol/L 108 (H)  CO2 20 - 29 mmol/L 22  Glucose 70 - 99 mg/dL 129 (H)  BUN 8 - 27 mg/dL 35 (H)  Creatinine 0.76 - 1.27 mg/dL 1.44 (H)  Calcium 8.6 - 10.2 mg/dL 9.0  BUN/Creatinine Ratio 10 -  24  24  eGFR >59 mL/min/1.73 49 (L)  Alkaline Phosphatase 44 - 121 IU/L 79  Albumin 3.8 - 4.8 g/dL 3.8  Albumin/Globulin Ratio  1.2 - 2.2  1.3  AST 0 - 40 IU/L 19  ALT 0 - 44 IU/L 21  Total Protein 6.0 - 8.5 g/dL 6.7  Total Bilirubin 0.0 - 1.2 mg/dL 0.3    ASSESSMENT / PLAN / RECOMMENDATIONS:   1) Type 2 Diabetes Mellitus, Optimally  controlled, With neuropathic, CKD III and macrovascular complications - Most recent A1c of 6.4%. Goal A1c <7.0%.  -His A1c has trended down from 7.9% to 6.4% -Per spouse they discontinued glipizide from October, 2023 until hospitalization due to hypoglycemia in October -I have offered switching glipizide to repaglinide for short half-life, but since they have plenty at home we have opted to remain on this for now -They are traveling to [place], and I did advise them to increase glipizide to half a tablet before breakfast and half a tablet before supper should his fasting BG's remain >180 mg/dL  -His GFR has trended up -He is on patient assistance for Farxiga - Pt has no medicare plan D  MEDICATIONS:  Continue Glipizide 5 mg, half a tablet before breakfast Continue Farxiga 10 mg daily  EDUCATION / INSTRUCTIONS: BG monitoring instructions: Patient is instructed to check his blood sugars to times a day, before breakfast and supper. Call [facility] if: BG persistently < 70  I reviewed the Rule of 15 for the treatment of hypoglycemia in detail with the patient. Literature supplied.   2)Diabetic complications:  Eye: Does not have known diabetic retinopathy.  Neuro/ Feet: Does have known diabetic peripheral neuropathy. Renal: Patient does have known baseline CKD. He is on an ACEI/ARB at present.    F/U in 6 months     Signed electronically by: Mack Guise, MD  [facility]  [facility] [address] [place], [place] [address] Phone: [phone] FAX: [phone]   CC: Orma Render, NP [place] [address] Phone: [phone]  Fax: [phone]  Return to Endocrinology clinic  as below: Future Appointments  Date Time Provider [place]  02/21/2023  2:00 PM Quientin Jent, Melanie Crazier, MD [facility] None  02/21/2023  3:15 PM Criselda Peaches, DPM [facility]  02/22/2023  1:45 PM [facility] None  03/15/2023  1:30 PM Deberah Pelton, NP [facility] None  04/04/2023  1:30 PM Early, Coralee Pesa, NP [facility]

## 2023-02-21 NOTE — Progress Notes (Signed)
  Subjective:  Patient ID: Seth [name], male    DOB: 1941-08-07,  MRN: [id number]  Chief Complaint  Patient presents with   Routine Post Op    POV #4 DOS 12/23 Partical 1st toe Amp Bilateral, L 2nd partial Amp.      82 y.o. male returns for post-op check.  He is doing much better Review of Systems: Negative except as noted in the HPI. Denies N/V/F/Ch.   Objective:  There were no vitals filed for this visit. There is no height or weight on file to calculate BMI. Constitutional Well developed. Well nourished.  Vascular Foot warm and well perfused. Capillary refill normal to all digits.  Calf is soft and supple, no posterior calf or knee pain, negative Homans' sign  Neurologic Normal speech. Oriented to person, place, and time. Epicritic sensation to light touch grossly reduced bilaterally.  Dermatologic Amputation site is well-healed.  Ulcer of right second toe has nearly fully healed with some scab left  Orthopedic: He has no tenderness to palpation noted about the surgical site.   Wound culture showed no growth other than skin flora  Assessment:   1. Hammertoe of right foot   2. Amputation of toe of left foot ([place])   3. Diabetic ulcer of toe of right foot associated with type 2 diabetes mellitus, with fat layer exposed ([place])    Plan:  Patient was evaluated and treated and all questions answered.   Ulcer right second toe -Doing very well amputation site remains well-healed.  Can leave right second toe open to air at this point.  No signs of infection or worsening ulceration.  I will see him back in about a month to month and a half when he returns from [place] from vacation, hopefully his shoes are ready by that point and can be dispensed as well.   Return in about 4 weeks (around 03/21/2023) for wound care.

## 2023-02-21 NOTE — Patient Instructions (Addendum)
-   Continue  Glipizide 5 mg, Half a tablet before Breakfast  - Continue  Farxiga 10 mg daily    If Fasting sugar is consistently over 180 mg/dL, please ADD HALF a tablet of glipizide before Supper    HOW TO TREAT LOW BLOOD SUGARS (Blood sugar LESS THAN 70 MG/DL) Please follow the RULE OF 15 for the treatment of hypoglycemia treatment (when your (blood sugars are less than 70 mg/dL)   STEP 1: Take 15 grams of carbohydrates when your blood sugar is low, which includes:  3-4 GLUCOSE TABS  OR 3-4 OZ OF JUICE OR REGULAR SODA OR ONE TUBE OF GLUCOSE GEL    STEP 2: RECHECK blood sugar in 15 MINUTES STEP 3: If your blood sugar is still low at the 15 minute recheck --> then, go back to STEP 1 and treat AGAIN with another 15 grams of carbohydrates.

## 2023-02-22 ENCOUNTER — Ambulatory Visit: Payer: Medicare Other | Attending: Cardiovascular Disease | Admitting: *Deleted

## 2023-02-22 ENCOUNTER — Encounter: Payer: Self-pay | Admitting: Internal Medicine

## 2023-02-22 DIAGNOSIS — Z5181 Encounter for therapeutic drug level monitoring: Secondary | ICD-10-CM | POA: Diagnosis not present

## 2023-02-22 DIAGNOSIS — I4891 Unspecified atrial fibrillation: Secondary | ICD-10-CM

## 2023-02-22 DIAGNOSIS — I480 Paroxysmal atrial fibrillation: Secondary | ICD-10-CM | POA: Diagnosis not present

## 2023-02-22 LAB — POCT INR: INR: 3.7 — AB (ref 2.0–3.0)

## 2023-02-22 NOTE — Patient Instructions (Signed)
Description   Do not take any warfarin today then start taking 1 tablet daily EXCEPT 2 tablet on Sundays.  Stay consistent with greens each week (2-3 times per week). Recheck INR in 3 weeks. Coumadin Clinic [phone].

## 2023-03-10 ENCOUNTER — Telehealth: Payer: Self-pay

## 2023-03-10 NOTE — Telephone Encounter (Signed)
Progress note faxed to [facility] per request from voice mail

## 2023-03-14 NOTE — Progress Notes (Unsigned)
Cardiology Office Note:    Date:  03/15/2023   ID:  Seth [name], DOB 03/29/1941, MRN [id number]  PCP:  Seth Render, NP   [place] Providers Cardiologist:  Lauree Chandler, MD Cardiology APP:  Deberah Pelton, NP     Referring MD: Seth Render, NP   Follow up s/p hospitalization.   History of Present Illness:    Seth [name] is a 82 y.o. male with a hx of PVD, hypertension, CAD (s/p CABG x 3 in 2015), PAF (anticoagulated on Coumadin), DM 2, ischemic cardiomyopathy, chronic systolic heart failure, history of prostate cancer, hyperlipidemia, OSA on CPAP .  He establish care with Dr. Angelena [name] in 2015 for evaluation of atrial fibrillation.  He was placed on Xarelto set up for an echocardiogram and stress test. Myocardial perfusion study showed  high risk with LVEF=32%, large areas of abnormal perfusion which may be scar.  Echocardiogram at this time showed an EF of 35 to 40% with akinesis.  He had a left heart cath on 02/21/2014 which showed an occluded left main, moderate RCA stenosis with the entire left system filling from right to left.  He underwent a three-vessel CABG on 3-15 LIMA to LAD, SVG to OM, SVG to PDA, was ultimately discharged on amiodarone and Coumadin.  Repeat echo on 05/28/2014 showed an EF of 35 to 40%, concentric LVH, mild MR.  He had a repeat echo in 2020 which revealed an EF of 50 to 55%, moderately increased LVH, impaired relaxation, right atrium was mildly enlarged, mild to moderate aortic valve sclerosis without stenosis.  Most recently he was evaluated on 12/23/2022 by Seth Montana, NP, at that time he had been recently admitted from 12/14/2022 to 12/18/2022 for bilateral wounds on his toes with osteomyelitis.  Torsemide and Aldactone were held due to AKI.  He was feeling well overall since his discharge, however his blood pressure was not at goal and his hydralazine was changed to 50 mg 3 times daily.  His torsemide was resumed after his  creatinine function stabilized.  He presents today accompanied by his wife, who is a retired Marine scientist, for follow-up of his hypertension.  Since his discharge in December his blood pressure has continued to rise.  He has been taking his hydralazine 3 times a day, with an extra 25 mg typically in the middle of the night around 3 AM.  His blood pressure readings in the morning after his a.m. dose of hydralazine and torsemide are typically [id number] systolic, over [id number] diastolic.  He does have some pedal edema that has been notably worse since the discontinuation of his spironolactone. He denies chest pain, palpitations, dyspnea, pnd, orthopnea, n, v, dizziness, syncope, weight gain, or early satiety.  They are preparing to leave for the [place] and are looking forward to getting his blood pressure under better control by this time.  Past Medical History:  Diagnosis Date   Acidosis 07/19/2021   Acute on chronic diastolic (congestive) heart failure ([place]) 09/30/2014   Atrial fibrillation (HCC)    a. coumadin;  b. Amiodarone   CAD (coronary artery disease)    a.  Lexiscan Myoview (02/14/14):  Apical, inferolateral scar; ? Mild ischemia; EF 32%.;  b.  LHC (02/21/14):  dLM 100%, LAD 100%, CFX 100%, dRCA 50%. => CABG (L-LAD, S-OM, S-PDA)   Chronic systolic heart failure (HCC)    CKD (chronic kidney disease)    proteinuria   Diabetes mellitus with peripheral autonomic  neuropathy ([place]) 09/30/2014   Hemorrhage of gastrointestinal tract, unspecified    Hemorrhage of rectum and anus    History of urinary stone 10/08/2007   HLD (hyperlipidemia)    HTN (hypertension)    Hypertension, benign 06/10/2010   Impotence of organic origin    Ischemic cardiomyopathy    a. Echo (02/17/14):  EF 35-40%, anteroseptal and apical AK, Gr 2 DD, MAC, mod LAE.   Nephrolithiasis    Obesity, unspecified 11/11/2009   Peripheral vascular disease, unspecified ([place])    Prostate cancer ([place])    Proteinuria 09/30/2009    Thrombocytopenia, unspecified ([place])    Tobacco use disorder    Tobacco user 11/11/2009   Type 2 diabetes mellitus with diabetic peripheral angiopathy without gangrene ([place]) 07/19/2021   Type II or unspecified type diabetes mellitus with renal manifestations, uncontrolled(250.42)    Urolithiasis 12/26/1997    Past Surgical History:  Procedure Laterality Date   AMPUTATION TOE Bilateral 12/17/2022   Procedure: TOE AMPUTATION OF LEFT 1ST AND 2ND PARTIAL, AND RIGHT 1ST PARTIAL;  Surgeon: Criselda Peaches, DPM;  Location: [facility];  Service: Podiatry;  Laterality: Bilateral;   BACK SURGERY     CATARACT EXTRACTION  2010   CLIPPING OF ATRIAL APPENDAGE Left 02/24/2014   Procedure: CLIPPING OF ATRIAL APPENDAGE;  Surgeon: Seth Pollack, MD;  Location: [facility];  Service: Open Heart Surgery;  Laterality: Left;   COLONOSCOPY  2005   Normal   CORONARY ARTERY BYPASS GRAFT N/A 02/24/2014   Procedure: Coronary artery bypass graft times three using left internal mammary artery and left leg greater saphenous vein harvested endoscopically.;  Surgeon: Seth Pollack, MD;  Location: [facility];  Service: Open Heart Surgery;  Laterality: N/A;   INTRAOPERATIVE TRANSESOPHAGEAL ECHOCARDIOGRAM N/A 02/24/2014   Procedure: INTRAOPERATIVE TRANSESOPHAGEAL ECHOCARDIOGRAM;  Surgeon: Seth Pollack, MD;  Location: [facility];  Service: Open Heart Surgery;  Laterality: N/A;   LEFT HEART CATHETERIZATION WITH CORONARY ANGIOGRAM N/A 02/21/2014   Procedure: LEFT HEART CATHETERIZATION WITH CORONARY ANGIOGRAM;  Surgeon: Seth M Martinique, MD;  Location: [facility] C[facility];  Service: Cardiovascular;  Laterality: N/A;    Current Medications: Current Meds  Medication Sig   Ascorbic Acid (VITAMIN C) 500 MG CAPS Take 1 capsule by mouth daily. (Patient taking differently: Take 2 capsules by mouth daily.)   aspirin EC 81 MG tablet Take 81 mg by mouth daily. Swallow whole.   citalopram (CELEXA) 10 MG tablet Take 1 tablet (10 mg total) by mouth daily.   Continuous  Blood Gluc Sensor (DEXCOM G6 SENSOR) MISC 1 Device by Does not apply route as directed.   Continuous Blood Gluc Transmit (DEXCOM G6 TRANSMITTER) MISC 1 Device by Does not apply route as directed.   dapagliflozin propanediol (FARXIGA) 10 MG TABS tablet Take 1 tablet (10 mg total) by mouth daily before breakfast.   Flaxseed, Linseed, (FLAXSEED OIL PO) Take 1 tablet by mouth daily.   glipiZIDE (GLUCOTROL) 5 MG tablet Take 0.5 tablets (2.5 mg total) by mouth daily before breakfast.   Multiple Vitamin (MULTIVITAMINS PO) Take 1 tablet by mouth daily.   potassium chloride SA (KLOR-CON M) 20 MEQ tablet Take 1 tablet (20 mEq total) by mouth daily.   spironolactone (ALDACTONE) 25 MG tablet Take 1 tablet (25 mg total) by mouth daily.   torsemide (DEMADEX) 20 MG tablet Take 1 tablet (20 mg total) by mouth daily. (Patient taking differently: Take 30 mg by mouth daily.)   vitamin E 200 UNIT capsule Take 200 Units  by mouth daily.   warfarin (COUMADIN) 5 MG tablet Take 1 to 2 tablets by mouth daily as directed by Anticoagulation Clinic. (Patient taking differently: Take 5-10 mg by mouth as directed. Take 2 tablets (10 mg) on (Sun, Tues, Thurs) & Take 1 tablet (5 mg) all other days as directed by Anticoagulation Clinic.)   [DISCONTINUED] atorvastatin (LIPITOR) 40 MG tablet Take 1 tablet by mouth once daily   [DISCONTINUED] carvedilol (COREG) 12.5 MG tablet TAKE 1 TABLET BY MOUTH TWICE DAILY WITH A MEAL   [DISCONTINUED] hydrALAZINE (APRESOLINE) 50 MG tablet Take 1 tablet (50 mg total) by mouth 3 (three) times daily.   [DISCONTINUED] zinc sulfate 220 (50 Zn) MG capsule Take 1 capsule (220 mg total) by mouth daily.     Allergies:   Patient has no known allergies.   Social History   Socioeconomic History   Marital status: Married    Spouse name: Not on file   Number of children: 3   Years of education: Not on file   Highest education level: Not on file  Occupational History   Occupation: Manufacturing engineer  buildings  Tobacco Use   Smoking status: Never   Smokeless tobacco: Former    Types: Chew    Quit date: 02/23/2014  Vaping Use   Vaping Use: Never used  Substance and Sexual Activity   Alcohol use: No   Drug use: No   Sexual activity: Never  Other Topics Concern   Not on file  Social History Narrative   Not on file   Social Determinants of Health   Financial Resource Strain: Not on file  Food Insecurity: No Food Insecurity (12/20/2022)   Hunger Vital Sign    Worried About Running Out of Food in the Last Year: Never true    [place] in the Last Year: Never true  Transportation Needs: No Transportation Needs (12/20/2022)   PRAPARE - Hydrologist (Medical): No    Lack of Transportation (Non-Medical): No  Physical Activity: Not on file  Stress: Not on file  Social Connections: Not on file     Family History: The patient's family history includes Cancer (age of onset: 55) in his mother; Heart attack (age of onset: 58) in his father; Heart disease in his father; Lung cancer in his brother. There is no history of Colon cancer or Rectal cancer.  ROS:   Please see the history of present illness.    All other systems reviewed and are negative.  EKGs/Labs/Other Studies Reviewed:    The following studies were reviewed today:   EKG:  EKG is not ordered today.    Recent Labs: 04/12/2022: TSH 2.260 12/18/2022: Magnesium 2.3 01/03/2023: ALT 21; BUN 35; Creatinine, Ser 1.44; Hemoglobin 9.9; Platelets 157; Potassium 4.2; Sodium 146  Recent Lipid Panel    Component Value Date/Time   CHOL 101 11/12/2021 1438   CHOL 101 06/08/2016 0843   TRIG 208.0 (H) 11/12/2021 1438   HDL 37.10 (L) 11/12/2021 1438   HDL 37 (L) 06/08/2016 0843   CHOLHDL 3 11/12/2021 1438   VLDL 41.6 (H) 11/12/2021 1438   LDLCALC 38 04/13/2021 0947   LDLDIRECT 41.0 11/12/2021 1438     Risk Assessment/Calculations:    CHA2DS2-VASc Score = 6   This indicates a 9.7% annual  risk of stroke. The patient's score is based upon: CHF History: 1 HTN History: 1 Diabetes History: 1 Stroke History: 0 Vascular Disease History: 1 Age Score: 2 Gender Score:  0           Physical Exam:    VS:  BP (!) 162/58 Comment: home  Pulse 74   Ht 5\' 8"  (1.727 m)   Wt 221 lb (100.2 kg)   SpO2 96%   BMI 33.60 kg/m     Wt Readings from Last 3 Encounters:  03/15/23 221 lb (100.2 kg)  02/21/23 218 lb (98.9 kg)  01/03/23 220 lb 12.8 oz (100.2 kg)     GEN:  Well nourished, well developed in no acute distress HEENT: Normal NECK: No JVD; No carotid bruits LYMPHATICS: No lymphadenopathy CARDIAC: RRR, no murmurs, rubs, gallops RESPIRATORY:  Clear to auscultation without rales, wheezing or rhonchi  ABDOMEN: Soft, non-tender, non-distended MUSCULOSKELETAL: +1 pitting edema mid tibia to ankle; No deformity  SKIN: Warm and dry NEUROLOGIC:  Alert and oriented x 3 PSYCHIATRIC:  Normal affect   ASSESSMENT:    1. Atrial fibrillation, unspecified type ([place])   2. Primary hypertension   3. Cardiomyopathy, ischemic   4. Coronary artery disease involving native coronary artery of native heart without angina pectoris   5. Mixed hyperlipidemia    PLAN:    In order of problems listed above:  Atrial fibrillation -regular rhythm noted upon auscultation, CHA2DS2-VASc Score = 6 [CHF History: 1, HTN History: 1, Diabetes History: 1, Stroke History: 0, Vascular Disease History: 1, Age Score: 2, Gender Score: 0].  Therefore, the patient's annual risk of stroke is 9.7 %.  Continue carvedilol, continue warfarin.     Ischemic cardiomyopathy -Lexiscan in 2015 showed mild ischemia, echo in 2015 EF was 35 to 40%, repeat echo in 2020 showed improvement in his EF at 50 to 55%.  NYHA class I-II today, euvolemic, although he does have mild pitting edema.  Continue carvedilol, Farxiga, torsemide.  CAD - left heart cath on 02/21/2014 which showed an occluded left main, moderate RCA stenosis. Stable  with no anginal symptoms. No indication for ischemic evaluation.  Continue aspirin, carvedilol, Lipitor.  HTN -blood pressure today is elevated at 186/74.  Blood pressure log at home also reveals elevated blood pressure readings however typically after his a.m. dose of torsemide and hydralazine his blood pressure is better managed.  Previously spironolactone worked well for the patient however it was held secondary to AKI during recent hospitalization.  Will repeat BMET today, plan to restart spironolactone at 12.5 mg daily pending BMET results.  Repeat BMET in 1 to 2 weeks.  Disposition-repeat BMET today, repeat BMET again in 10 days--if we restart spironolactone, if okay plan to restart spironolactone at 12.5 mg daily.  Return in 6 weeks           Medication Adjustments/Labs and Tests Ordered: Current medicines are reviewed at length with the patient today.  Concerns regarding medicines are outlined above.  Orders Placed This Encounter  Procedures   Basic metabolic panel   Basic metabolic panel   Meds ordered this encounter  Medications   carvedilol (COREG) 12.5 MG tablet    Sig: Take 1 tablet (12.5 mg total) by mouth 2 (two) times daily with a meal.    Dispense:  180 tablet    Refill:  3   atorvastatin (LIPITOR) 40 MG tablet    Sig: Take 1 tablet (40 mg total) by mouth daily.    Dispense:  90 tablet    Refill:  3   hydrALAZINE (APRESOLINE) 50 MG tablet    Sig: Take 1 tablet (50 mg total) by mouth 3 (three) times daily.  Dispense:  270 tablet    Refill:  3   spironolactone (ALDACTONE) 25 MG tablet    Sig: Take 1 tablet (25 mg total) by mouth daily.    Dispense:  30 tablet    Refill:  3    Patient Instructions  Medication Instructions:  START SPIRONOLACTONE 25MG -Ricarda Atayde WILL CALL YOU AND TELL YOU WHEN TO START. *If you need a refill on your cardiac medications before your next appointment, please call your pharmacy*  Lab Work: BMET TODAY  AND BMET AGAIN IN 2 WEEKS If  you have labs (blood work) drawn today and your tests are completely normal, you will receive your results only by: [place] (if you have MyChart) OR A paper copy in the mail  If you have any lab test that is abnormal or we need to change your treatment, we will call you to review the results.  Testing/Procedures: NONE  Follow-Up: At [facility], you and your health needs are our priority.  As part of our continuing mission to provide you with exceptional heart care, we have created designated Provider Care Teams.  These Care Teams include your primary Cardiologist (physician) and Advanced Practice Providers (APPs -  Physician Assistants and Nurse Practitioners) who all work together to provide you with the care you need, when you need it.  Your next appointment:   6 week(s)  Provider:   Coletta Memos, FNP-C ONLY    Other Instructions     Signed, Trudi Ida, NP  03/15/2023 4:48 PM    [place]

## 2023-03-15 ENCOUNTER — Ambulatory Visit (INDEPENDENT_AMBULATORY_CARE_PROVIDER_SITE_OTHER): Payer: Medicare Other

## 2023-03-15 ENCOUNTER — Encounter: Payer: Self-pay | Admitting: Cardiology

## 2023-03-15 ENCOUNTER — Ambulatory Visit: Payer: Medicare Other | Attending: General Practice | Admitting: Cardiology

## 2023-03-15 VITALS — BP 162/58 | HR 74 | Ht 68.0 in | Wt 221.0 lb

## 2023-03-15 DIAGNOSIS — I480 Paroxysmal atrial fibrillation: Secondary | ICD-10-CM | POA: Diagnosis present

## 2023-03-15 DIAGNOSIS — I4891 Unspecified atrial fibrillation: Secondary | ICD-10-CM | POA: Diagnosis present

## 2023-03-15 DIAGNOSIS — I255 Ischemic cardiomyopathy: Secondary | ICD-10-CM | POA: Diagnosis present

## 2023-03-15 DIAGNOSIS — I10 Essential (primary) hypertension: Secondary | ICD-10-CM

## 2023-03-15 DIAGNOSIS — E782 Mixed hyperlipidemia: Secondary | ICD-10-CM | POA: Diagnosis present

## 2023-03-15 DIAGNOSIS — Z5181 Encounter for therapeutic drug level monitoring: Secondary | ICD-10-CM | POA: Diagnosis present

## 2023-03-15 DIAGNOSIS — I2510 Atherosclerotic heart disease of native coronary artery without angina pectoris: Secondary | ICD-10-CM | POA: Diagnosis present

## 2023-03-15 LAB — POCT INR: INR: 2.9 (ref 2.0–3.0)

## 2023-03-15 MED ORDER — SPIRONOLACTONE 25 MG PO TABS: 25.0000 mg | ORAL_TABLET | Freq: Every day | ORAL | 3 refills | Status: DC

## 2023-03-15 MED ORDER — CARVEDILOL 12.5 MG PO TABS: 12.5000 mg | ORAL_TABLET | Freq: Two times a day (BID) | ORAL | 3 refills | Status: DC

## 2023-03-15 MED ORDER — ATORVASTATIN CALCIUM 40 MG PO TABS: 40.0000 mg | ORAL_TABLET | Freq: Every day | ORAL | 3 refills | Status: DC

## 2023-03-15 MED ORDER — HYDRALAZINE HCL 50 MG PO TABS: 50.0000 mg | ORAL_TABLET | Freq: Three times a day (TID) | ORAL | 3 refills | Status: DC

## 2023-03-15 NOTE — Patient Instructions (Signed)
Medication Instructions:  START SPIRONOLACTONE 25MG -JENNIFER WILL CALL YOU AND TELL YOU WHEN TO START. *If you need a refill on your cardiac medications before your next appointment, please call your pharmacy*  Lab Work: BMET TODAY  AND BMET AGAIN IN 2 WEEKS If you have labs (blood work) drawn today and your tests are completely normal, you will receive your results only by: [place] (if you have MyChart) OR A paper copy in the mail  If you have any lab test that is abnormal or we need to change your treatment, we will call you to review the results.  Testing/Procedures: NONE  Follow-Up: At [facility], you and your health needs are our priority.  As part of our continuing mission to provide you with exceptional heart care, we have created designated Provider Care Teams.  These Care Teams include your primary Cardiologist (physician) and Advanced Practice Providers (APPs -  Physician Assistants and Nurse Practitioners) who all work together to provide you with the care you need, when you need it.  Your next appointment:   6 week(s)  Provider:   Coletta Memos, FNP-C ONLY    Other Instructions

## 2023-03-15 NOTE — Patient Instructions (Signed)
Description   Continue taking 1 tablet daily EXCEPT 2 tablet on Sundays.   Stay consistent with greens each week (2-3 times per week).  Recheck INR in 4 weeks.  Coumadin Clinic [phone].

## 2023-03-16 ENCOUNTER — Telehealth: Payer: Self-pay

## 2023-03-16 ENCOUNTER — Telehealth: Payer: Self-pay | Admitting: Nurse Practitioner

## 2023-03-16 LAB — BASIC METABOLIC PANEL WITH GFR
BUN/Creatinine Ratio: 15 (ref 10–24)
BUN: 27 mg/dL (ref 8–27)
CO2: 25 mmol/L (ref 20–29)
Calcium: 9.2 mg/dL (ref 8.6–10.2)
Chloride: 110 mmol/L — ABNORMAL HIGH (ref 96–106)
Creatinine, Ser: 1.79 mg/dL — ABNORMAL HIGH (ref 0.76–1.27)
Glucose: 107 mg/dL — ABNORMAL HIGH (ref 70–99)
Potassium: 4.2 mmol/L (ref 3.5–5.2)
Sodium: 148 mmol/L — ABNORMAL HIGH (ref 134–144)
eGFR: 38 mL/min/1.73 — ABNORMAL LOW

## 2023-03-16 NOTE — Telephone Encounter (Signed)
Contacted Jeannie Done to schedule their annual wellness visit. Call back at later date: 03/27/23  [facility] [employer] AWV direct phone # [phone]*

## 2023-03-16 NOTE — Telephone Encounter (Signed)
Patient and wife aware of lab results

## 2023-03-16 NOTE — Telephone Encounter (Signed)
-----   Message from Trudi Ida, NP sent at 03/16/2023  8:01 AM EDT ----- Mr. Spata,  Your kidney function is stable. Lets try starting spironolactone 12.5 mg daily like we discussed. We need to check your kidney function again in a ~ 10 days, which we arranged yesterday.  Best, Anderson Malta

## 2023-03-29 LAB — BASIC METABOLIC PANEL WITH GFR
BUN/Creatinine Ratio: 19 (ref 10–24)
BUN: 29 mg/dL — ABNORMAL HIGH (ref 8–27)
CO2: 24 mmol/L (ref 20–29)
Calcium: 9.1 mg/dL (ref 8.6–10.2)
Chloride: 104 mmol/L (ref 96–106)
Creatinine, Ser: 1.54 mg/dL — ABNORMAL HIGH (ref 0.76–1.27)
Glucose: 164 mg/dL — ABNORMAL HIGH (ref 70–99)
Potassium: 4.5 mmol/L (ref 3.5–5.2)
Sodium: 143 mmol/L (ref 134–144)
eGFR: 45 mL/min/1.73 — ABNORMAL LOW

## 2023-03-30 ENCOUNTER — Telehealth: Payer: Self-pay

## 2023-03-30 DIAGNOSIS — I5022 Chronic systolic (congestive) heart failure: Secondary | ICD-10-CM

## 2023-03-30 NOTE — Telephone Encounter (Signed)
-----   Message from Trudi Ida, NP sent at 03/30/2023  4:39 PM EDT ----- Mr. Royes, Blood work shows stable and slightly improved kidney function. Your potassium is at the high normal range, which can happen with spironolactone. So I want you to stop your potassium supplement.  You are coming for a coumadin check on 4/17, so I want to repeat your BMET one more time on that date to make sure your potassium does not get too low.  Best, Anderson Malta

## 2023-03-30 NOTE — Telephone Encounter (Signed)
Wife and patient aware of lab results and provider recommendations. Verbalized understanding.  BMET ordered

## 2023-04-04 ENCOUNTER — Ambulatory Visit (INDEPENDENT_AMBULATORY_CARE_PROVIDER_SITE_OTHER): Payer: Medicare Other | Admitting: Nurse Practitioner

## 2023-04-04 ENCOUNTER — Encounter: Payer: Self-pay | Admitting: Nurse Practitioner

## 2023-04-04 VITALS — BP 130/68 | HR 57 | Wt 211.2 lb

## 2023-04-04 DIAGNOSIS — I5022 Chronic systolic (congestive) heart failure: Secondary | ICD-10-CM | POA: Diagnosis not present

## 2023-04-04 DIAGNOSIS — I129 Hypertensive chronic kidney disease with stage 1 through stage 4 chronic kidney disease, or unspecified chronic kidney disease: Secondary | ICD-10-CM

## 2023-04-04 DIAGNOSIS — E1122 Type 2 diabetes mellitus with diabetic chronic kidney disease: Secondary | ICD-10-CM

## 2023-04-04 DIAGNOSIS — Z23 Encounter for immunization: Secondary | ICD-10-CM

## 2023-04-04 DIAGNOSIS — R2689 Other abnormalities of gait and mobility: Secondary | ICD-10-CM

## 2023-04-04 DIAGNOSIS — S51011A Laceration without foreign body of right elbow, initial encounter: Secondary | ICD-10-CM

## 2023-04-04 DIAGNOSIS — E1159 Type 2 diabetes mellitus with other circulatory complications: Secondary | ICD-10-CM

## 2023-04-04 DIAGNOSIS — Z794 Long term (current) use of insulin: Secondary | ICD-10-CM

## 2023-04-04 DIAGNOSIS — I10 Essential (primary) hypertension: Secondary | ICD-10-CM

## 2023-04-04 DIAGNOSIS — W19XXXA Unspecified fall, initial encounter: Secondary | ICD-10-CM

## 2023-04-04 DIAGNOSIS — I130 Hypertensive heart and chronic kidney disease with heart failure and stage 1 through stage 4 chronic kidney disease, or unspecified chronic kidney disease: Secondary | ICD-10-CM

## 2023-04-04 DIAGNOSIS — E1169 Type 2 diabetes mellitus with other specified complication: Secondary | ICD-10-CM

## 2023-04-04 DIAGNOSIS — I480 Paroxysmal atrial fibrillation: Secondary | ICD-10-CM

## 2023-04-04 DIAGNOSIS — N1831 Chronic kidney disease, stage 3a: Secondary | ICD-10-CM

## 2023-04-04 DIAGNOSIS — N183 Chronic kidney disease, stage 3 unspecified: Secondary | ICD-10-CM

## 2023-04-04 DIAGNOSIS — E785 Hyperlipidemia, unspecified: Secondary | ICD-10-CM

## 2023-04-04 HISTORY — DX: Laceration without foreign body of right elbow, initial encounter: S51.011A

## 2023-04-04 MED ORDER — TORSEMIDE 20 MG PO TABS: 30.0000 mg | ORAL_TABLET | Freq: Every day | ORAL | 0 refills | Status: DC

## 2023-04-04 NOTE — Patient Instructions (Signed)
Please be careful as you are out working around to reduce your risk of falls. Keep staying active, this is helpful for your overall health.

## 2023-04-04 NOTE — Progress Notes (Signed)
Seth Clamp, DNP, AGNP-c [facility]  [address] [place], [place] [address] [phone]  ESTABLISHED PATIENT- Chronic Health and/or Follow-Up Visit  Blood pressure 130/68, pulse (!) 57, weight 211 lb 3.2 oz (95.8 kg), SpO2 98 %.    Seth [name] is a 82 y.o. year old male presenting today for evaluation and management of chronic conditions.   Seth [name] presents today with chief complaints of balance issues and frequent falls, with the most recent fall occurring while working in the garden this week. He denies any significant pain from the recent fall, but has sustained a skin tear on his arm and some bruising on his back and shoulders. He also mentions hitting the back of his neck during the fall but did not lose consciousness. Seth [name] states that he does not use a cane or walker consistently, but has both available.  Seth [name]'s wife reports that his morning blood sugar readings have been reasonable, ranging from 110 to 125, and his most recent HbA1c was 6.8. She also mentions that Seth [name] has been more active lately, engaging in activities such as rinsing cars and raking leaves. Seth [name] is currently taking multiple medications, including Farxiga, Glipizide, Lipitor, Spironolactone, and Torsemide, and his wife reports that his labs have shown some improvement in creatinine and GFR levels.  The patient states that he feels the Citalopram (Celexa) has been helpful.  Seth [name]'s wife also reports that they both had COVID in February, and their last COVID vaccine was in December. She describes their experience with COVID as mild and managed by sleeping in a chair to minimize lung involvement.  All ROS negative with exception of what is listed above.   PHYSICAL EXAM Physical Exam Vitals and nursing note reviewed.  Constitutional:      Appearance: Normal appearance. He is not ill-appearing.  HENT:     Head: Normocephalic.  Eyes:     Pupils: Pupils are equal, round, and reactive to light.   Neck:     Vascular: No carotid bruit.  Cardiovascular:     Rate and Rhythm: Normal rate and regular rhythm.     Pulses: Normal pulses.     Heart sounds: Normal heart sounds.  Pulmonary:     Effort: Pulmonary effort is normal.     Breath sounds: Normal breath sounds. No wheezing or rhonchi.  Abdominal:     General: Abdomen is flat. Bowel sounds are normal.     Palpations: Abdomen is soft.  Musculoskeletal:        General: Normal range of motion.     Cervical back: Normal range of motion and neck supple. No tenderness.     Comments: +1 pitting edema on lower extremities bilaterally.   Lymphadenopathy:     Cervical: No cervical adenopathy.  Skin:    General: Skin is warm and dry.     Capillary Refill: Capillary refill takes less than 2 seconds.     Findings: Abrasion, bruising, erythema, signs of injury and wound present.       Neurological:     General: No focal deficit present.     Mental Status: He is alert and oriented to person, place, and time.     Motor: Weakness present.  Psychiatric:        Mood and Affect: Mood normal.     PLAN Problem List Items Addressed This Visit     Hypertension associated with stage 3 chronic kidney disease due to type 2 diabetes mellitus    Blood pressures stable at this  time. Goal <140/85. Labs are pending at this time.  Plan: Continue carvedilol and spironolactone Monitor BP at home at least weekly and report readings higher than 140/90 or lower than 100/50      Relevant Medications   torsemide (DEMADEX) 20 MG tablet   Hyperlipidemia associated with type 2 diabetes mellitus    No alarm symptoms present. Currently managed with lipitor.  Plan: No changes to plan of care.  Labs pending      Relevant Medications   torsemide (DEMADEX) 20 MG tablet   PAF (paroxysmal atrial fibrillation)    HRRR in the office today with no alarm symptoms. No changes to plan of care at this time. Continue with current medication management and close  monitoring of warfarin levels.       Relevant Medications   torsemide (DEMADEX) 20 MG tablet   Chronic systolic congestive heart failure    Chronic. No swelling or shortness of breath noted. Labs pending. No changes to plan at this time.       Relevant Medications   torsemide (DEMADEX) 20 MG tablet   Type 2 diabetes mellitus with stage 3a chronic kidney disease, with long-term current use of insulin    Chronic diabetes with recent good control. No hypoglycemic episodes present.  Plan: Continue farxiga and glipizide.  Continue monitoring with dexcom Continue to increase activity as tolerated. I strongly recommend using a cane when out working in the yard to help with stability.       Imbalance    Increased falls with recent injuries to the right side of the body with fall outside of the home. Skin tears and bruises are present without significant signs of injury, which is reassuring.  Plan: Use the cane or walker as much as possible when out of the house  Physical therapy recommended for strength and gait training Monitor for worsening balance issues or falls and report immediatel.y       Cut of skin of right elbow    Recent fall with lacerations and skin tears present, primarily on the right side of the body. Bleeding appears to be well controlled with no signs of infection. Ecchymosis is present, but no more than expected. Discussion about fall safety and recommendation for use of assistive device when out working.       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   Seth Clamp, DNP, AGNP-c 04/04/2023  1:36 PM

## 2023-04-05 ENCOUNTER — Ambulatory Visit (INDEPENDENT_AMBULATORY_CARE_PROVIDER_SITE_OTHER): Payer: Medicare Other | Admitting: Podiatry

## 2023-04-05 DIAGNOSIS — S98132A Complete traumatic amputation of one left lesser toe, initial encounter: Secondary | ICD-10-CM

## 2023-04-05 DIAGNOSIS — M2041 Other hammer toe(s) (acquired), right foot: Secondary | ICD-10-CM

## 2023-04-09 NOTE — Progress Notes (Signed)
  Subjective:  Patient ID: Seth [name], male    DOB: Oct 25, 1941,  MRN: [id number]  Chief Complaint  Patient presents with   Diabetic Ulcer    Follow up right toe      82 y.o. male returns for post-op check.  He is doing well he was fitted for his diabetic shoes but has not received them yet.  Review of Systems: Negative except as noted in the HPI. Denies N/V/F/Ch.   Objective:  There were no vitals filed for this visit. There is no height or weight on file to calculate BMI. Constitutional Well developed. Well nourished.  Vascular Foot warm and well perfused. Capillary refill normal to all digits.  Calf is soft and supple, no posterior calf or knee pain, negative Homans' sign  Neurologic Normal speech. Oriented to person, place, and time. Epicritic sensation to light touch grossly reduced bilaterally.  Dermatologic Ulcerations fully healed.  Not hypertrophic skin.  No new lesions noted  Orthopedic: He has no tenderness to palpation noted about the surgical site.   Wound culture showed no growth other than skin flora  Assessment:   1. Hammertoe of right foot   2. Amputation of toe of left foot    Plan:  Patient was evaluated and treated and all questions answered.   Ulcer right second toe -Overall continues to do well.  Return in 3 months for at risk diabetic footcare on a regular basis or as needed.  His diabetic shoes were not ready dispensed today, there was an issue with the date on the paperwork from his endocrinologist, this was addressed today and hopefully can be dispensed soon.  Will be contacted when the shoes are ready for dispensation.  We discussed the importance of avoiding going barefoot, diabetic footcare and he and his wife will let me know if there are any issues with this.   Return in about 3 months (around 07/05/2023) for at risk diabetic foot care.

## 2023-04-10 ENCOUNTER — Encounter: Payer: Self-pay | Admitting: Nurse Practitioner

## 2023-04-10 NOTE — Assessment & Plan Note (Signed)
Chronic. No swelling or shortness of breath noted. Labs pending. No changes to plan at this time.

## 2023-04-10 NOTE — Assessment & Plan Note (Signed)
Recent fall with lacerations and skin tears present, primarily on the right side of the body. Bleeding appears to be well controlled with no signs of infection. Ecchymosis is present, but no more than expected. Discussion about fall safety and recommendation for use of assistive device when out working.

## 2023-04-10 NOTE — Assessment & Plan Note (Signed)
Blood pressures stable at this time. Goal <140/85. Labs are pending at this time.  Plan: Continue carvedilol and spironolactone Monitor BP at home at least weekly and report readings higher than 140/90 or lower than 100/50

## 2023-04-10 NOTE — Assessment & Plan Note (Signed)
Chronic diabetes with recent good control. No hypoglycemic episodes present.  Plan: Continue farxiga and glipizide.  Continue monitoring with dexcom Continue to increase activity as tolerated. I strongly recommend using a cane when out working in the yard to help with stability.

## 2023-04-10 NOTE — Assessment & Plan Note (Signed)
HRRR in the office today with no alarm symptoms. No changes to plan of care at this time. Continue with current medication management and close monitoring of warfarin levels.

## 2023-04-10 NOTE — Assessment & Plan Note (Signed)
Increased falls with recent injuries to the right side of the body with fall outside of the home. Skin tears and bruises are present without significant signs of injury, which is reassuring.  Plan: Use the cane or walker as much as possible when out of the house  Physical therapy recommended for strength and gait training Monitor for worsening balance issues or falls and report immediatel.y

## 2023-04-10 NOTE — Assessment & Plan Note (Signed)
No alarm symptoms present. Currently managed with lipitor.  Plan: No changes to plan of care.  Labs pending

## 2023-04-11 ENCOUNTER — Telehealth: Payer: Self-pay

## 2023-04-11 NOTE — Telephone Encounter (Signed)
Clinical notes faxed to [facility] through [employer] per fax request.

## 2023-04-12 ENCOUNTER — Ambulatory Visit: Payer: Medicare Other

## 2023-04-14 ENCOUNTER — Ambulatory Visit: Payer: Medicare Other | Attending: Cardiology | Admitting: *Deleted

## 2023-04-14 ENCOUNTER — Ambulatory Visit: Payer: Medicare Other

## 2023-04-14 ENCOUNTER — Other Ambulatory Visit: Payer: Self-pay

## 2023-04-14 DIAGNOSIS — I4891 Unspecified atrial fibrillation: Secondary | ICD-10-CM | POA: Diagnosis present

## 2023-04-14 DIAGNOSIS — Z5181 Encounter for therapeutic drug level monitoring: Secondary | ICD-10-CM | POA: Diagnosis present

## 2023-04-14 DIAGNOSIS — I5022 Chronic systolic (congestive) heart failure: Secondary | ICD-10-CM

## 2023-04-14 LAB — POCT INR: POC INR: 2.7

## 2023-04-14 NOTE — Patient Instructions (Addendum)
Description   Continue taking 1 tablet daily EXCEPT 2 tablet on Sundays.   Stay consistent with greens each week (2-3 times per week).  Recheck INR in 3 weeks. ( Per pt request) [facility] [phone].

## 2023-04-15 LAB — BASIC METABOLIC PANEL WITH GFR
BUN/Creatinine Ratio: 16 (ref 10–24)
BUN: 27 mg/dL (ref 8–27)
CO2: 25 mmol/L (ref 20–29)
Calcium: 8.8 mg/dL (ref 8.6–10.2)
Chloride: 104 mmol/L (ref 96–106)
Creatinine, Ser: 1.69 mg/dL — ABNORMAL HIGH (ref 0.76–1.27)
Glucose: 160 mg/dL — ABNORMAL HIGH (ref 70–99)
Potassium: 3.7 mmol/L (ref 3.5–5.2)
Sodium: 144 mmol/L (ref 134–144)
eGFR: 41 mL/min/1.73 — ABNORMAL LOW

## 2023-04-17 ENCOUNTER — Telehealth: Payer: Self-pay

## 2023-04-17 NOTE — Telephone Encounter (Signed)
-----   Message from Flossie Dibble, NP sent at 04/16/2023  4:27 PM EDT ----- Mr. Uttech, Your potassium is normal. The spironolactone often raises potassium so you do not need to continue to take a supplement.   Best, Victorino Dike

## 2023-04-17 NOTE — Telephone Encounter (Signed)
Wife is aware of lab results. She verbalized understanding. She states he has not been taking the potassium supplement.

## 2023-04-19 ENCOUNTER — Telehealth: Payer: Self-pay | Admitting: Nurse Practitioner

## 2023-04-19 NOTE — Telephone Encounter (Signed)
Contacted Seth [name] to schedule their annual wellness visit. Call back at later date: 04/2023  [name] AWV direct phone # [phone]

## 2023-04-25 NOTE — Telephone Encounter (Signed)
Clinical notes routed via Epic [phone].

## 2023-05-01 NOTE — Progress Notes (Unsigned)
Cardiology Clinic Note   Patient Name: Seth [name] Date of Encounter: 05/03/2023  Primary Care Provider:  Tollie Eth, NP Primary Cardiologist:  Verne Carrow, MD  Patient Profile    Seth [name] presents to the clinic today for follow-up evaluation of his hypertension, peripheral vascular disease, and ischemic cardiomyopathy.  Past Medical History    Past Medical History:  Diagnosis Date   Acidosis 07/19/2021   Acute on chronic diastolic (congestive) heart failure (HCC) 09/30/2014   AKI (acute kidney injury) (HCC) 12/15/2022   Atrial fibrillation (HCC)    a. coumadin;  b. Amiodarone   CAD (coronary artery disease)    a.  Lexiscan Myoview (02/14/14):  Apical, inferolateral scar; ? Mild ischemia; EF 32%.;  b.  LHC (02/21/14):  dLM 100%, LAD 100%, CFX 100%, dRCA 50%. 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 Echo (02/17/14):  EF 35-40%, anteroseptal and apical AK, Gr 2 DD, MAC, mod LAE.   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) 07/19/2021   Type II or unspecified type diabetes mellitus with renal manifestations,  uncontrolled(250.42)    Urolithiasis 12/26/1997   Past Surgical History:  Procedure Laterality Date   AMPUTATION TOE Bilateral 12/17/2022   Procedure: TOE AMPUTATION OF LEFT 1ST AND 2ND PARTIAL, AND RIGHT 1ST PARTIAL;  Surgeon: Edwin Cap, DPM;  Location: [facility];  Service: Podiatry;  Laterality: Bilateral;   BACK SURGERY     CATARACT EXTRACTION  2010   CLIPPING OF ATRIAL APPENDAGE Left 02/24/2014   Procedure: CLIPPING OF ATRIAL APPENDAGE;  Surgeon: Alleen Borne, MD;  Location: [facility] OR;  Service: Open Heart Surgery;  Laterality: Left;   COLONOSCOPY  2005   Normal   CORONARY ARTERY BYPASS GRAFT N/A 02/24/2014   Procedure: Coronary artery bypass graft times three using left internal mammary artery and left leg greater saphenous vein harvested endoscopically.;  Surgeon: Alleen Borne, MD;  Location: [facility] OR;  Service: Open Heart Surgery;  Laterality: N/A;   INTRAOPERATIVE TRANSESOPHAGEAL ECHOCARDIOGRAM N/A 02/24/2014   Procedure: INTRAOPERATIVE TRANSESOPHAGEAL ECHOCARDIOGRAM;  Surgeon: Alleen Borne, MD;  Location: [facility] OR;  Service: Open Heart Surgery;  Laterality: N/A;   LEFT HEART CATHETERIZATION WITH CORONARY ANGIOGRAM N/A 02/21/2014   Procedure: LEFT HEART CATHETERIZATION WITH CORONARY ANGIOGRAM;  Surgeon: Peter M Swaziland, MD;  Location: [facility] CATH LAB;  Service: Cardiovascular;  Laterality: N/A;    Allergies  No Known Allergies  History of Present Illness    Seth [name] has a PMH of atrial fibrillation, diabetes, hypertension, hyperlipidemia, coronary artery disease status post CABG, prostate cancer, renal insufficiency, and obesity.  He  was initially seen 2/15 for evaluation of his atrial fibrillation and was placed on Xarelto.  He underwent stress testing which was abnormal and showed apical inferior lateral scar and ischemia.  His LVEF was around 32% echocardiogram 2/15 showed an LVEF of 35-40% with akinesis in the mid distal anterioseptal and apical portions of the myocardium.  He was  also noted to have dilated left atria.  A cardiac catheterization 02/21/2014 showed occluded left main, moderate RCA stenosis with his entire left system filling from right to left collaterals.  He underwent CABG x3 02/24/14 (LIMA-LAD, SVG-OM, SVG-PDA.  Echocardiogram 05/28/2014 showed an LVEF of 35-40%, concentric LVH, and mild MR.  A follow-up echocardiogram 2020 showed an LVEF 50-55%, LVH, and no significant valvular abnormalities.  CHA2DS2-VASc score is 6 (CHF, HTN, diabetes, vascular disease, age-64)   He followed up with Dr. Clifton James 11/16/2020 and denied chest pain, palpitations, dyspnea, lower extremity swelling, orthopnea, PND, dizziness, near-syncope, and syncope.   He presented to the clinic 08/03/2021 for follow-up evaluation stated he was recently admitted to [facility] for CHF exacerbation.  He presented with his wife and daughter.  He was discharged on 07/24/2019 and his weight was around 236 pounds.  His wife, retired Charity fundraiser, indicated that he was having significant amount of trouble breathing, required BiPAP, and had significant lower extremity edema.  He received IV diuresis.  On discharge his furosemide was changed to torsemide.  His weight today was 232.6 pounds on his home scale from 237.7 pounds in the office.  He had been discharged home on 2 L of oxygen.  He was no longer requiring oxygen.  His oxygen saturation was in the mid 90s in the office.  He did have a sleep study scheduled at his [facility] this coming Sunday.  I reviewed the importance of low-sodium diet and medication compliance.  I requested labs from his recent hospitalization and [facility].  We decreased his torsemide to 40 mg daily given his recent weight loss.  I planned follow-up with me as scheduled on 08/17/2021, in 3 months, and with Dr. Clifton James in 6 months.  I ordered an INR .  He reported that he was in the process of visiting dermatology to have a lesion removed on his back.  I  requested that they ask  dermatology to send [place] a preoperative cardiac evaluation form so that our pharmacy may manage his warfarin.   He presented to the clinic 08/17/2021 for follow-up evaluation stated he felt well.  He presented with his wife retired Charity fundraiser and daughter.  We did a medication reconciliation.  His blood pressure was 100/60.  He denied presyncope and syncope.  I  asked him to maintain a blood pressure log.  We will continue his lisinopril at the current dose at this time.  He had been taking 40 mill equivalents of potassium with his torsemide.  I  continued 40 mill equivalent dosing and refill medication.  We  continued daily weights, heart healthy low-sodium diet, and maintained his physical activity.  They were planning to take a trip to [place] in the next few days.  I asked him to get up and ambulate every hour.  I  also recommended wearing lower extremity support stockings on his trip.  Follow-up was planned for 3 months and Dr. Clifton James in 6 months.   He presented to the clinic 11/22/21 for follow-up evaluation stated he felt well.  He did well with his trip to [place].  They  stopped several times during their trip up and drove straight through on the way back.  He did note slight lower extremity edema which resolved in about a day.  His weight had remained stable.  His blood pressures been well controlled in 130s over 70s at home.  His blood pressure  the clinic was 162/72.  He took his blood pressure medication 20 minutes before his visit .  We reviewed signs and symptoms for heart failure.  He was working with neurology and endocrinology.  He reported a fall a couple weeks ago where he had an abrasion on his head.  His wife reports that she monitored him for neuro changes.  I reviewed precautions for being evaluated in ED especially for head injury.  I  also asked him to start to carry a cane regularly as a precaution due to his unsteady gait.  He had a new Dexicom, diabetes monitor.  We reviewed his most recent  lab work.  It remained stable.  We kept his follow-up with Dr. Clifton James in February.  He was seen in follow-up by Dr. Clifton James on 01/31/2022.  During that time he remained stable from a cardiac standpoint.  He had no lower extremity swelling.  He reported feeling great.  He was not exercising.  His blood pressure was 136/60 with a pulse of 69.  He presented  to the clinic 07/27/22 for follow-up evaluation with his wife and stated he was doing well.  His wife reported that he had [place]  The admission was for CHF exacerbation.  In the office his blood pressure is 133/74.  We reviewed his medications I will have him take scheduled hydralazine in the afternoon and as needed for blood pressures greater than 160 systolic.  We  planned follow-up for 6 months.    He was seen in follow-up by Wallis Bamberg, NP-C on 03/15/2023.  He was accompanied by his wife.  They reported that since his discharge in December his blood pressure continued to elevate.  He had been taking his hydralazine 3 times per day with an extra 25 mg in the middle of the night around 3 AM.  They reported blood pressures in the 140s-60s over 50s/70s.  He was noted to have some pedal edema.  His lower extremity swelling was increased since discontinuation of spironolactone.  He denied chest pain palpitations PND and orthopnea.  They were preparing to leave for summer to head to the mountains.  His spironolactone was restarted.  His BMP remained stable 04/14/2023 with a creatinine of 1.69 and a potassium of 3.7.  He presents to the clinic today for follow-up evaluation and states he feels fairly well.  He has been having some instability with his gait after his great toe amputations.  We reviewed the importance of stability with walker and cane.  He and his wife expressed understanding.  His blood pressure today is 110/62 and his weight is 207.  He received his new shoes with orthotics  today.  He is euvolemic.  I reviewed the importance of low-salt diet.  His renal function is stable.  We will plan follow-up in 9 months with Dr. Clifton James.   Today he denies chest pain, shortness of breath, lower extremity edema, fatigue, palpitations, melena, hematuria, hemoptysis, diaphoresis, weakness, presyncope, syncope, orthopnea, and PND.   Home Medications    Prior to Admission medications   Medication Sig Start Date End Date Taking? Authorizing Provider  aspirin EC 81 MG tablet Take 1 tablet (81 mg total) by mouth daily. 03/17/14   Tereso Newcomer T, PA-C  atorvastatin (LIPITOR) 40 MG tablet Take 1 tablet (40 mg total) by mouth daily. 01/31/22   Kathleene Hazel, MD  Calcium Carbonate-Vitamin D 600-200 MG-UNIT TABS Take 1 tablet by mouth daily. For bones    [provider]  carvedilol (COREG) 12.5 MG tablet Take 1 tablet (12.5 mg total) by mouth 2 (two) times daily with a meal. 01/31/22   Kathleene Hazel, MD  citalopram (CELEXA) 10 MG tablet Take 10 mg by mouth daily. 06/23/22   [provider]  cloNIDine (CATAPRES) 0.1 MG tablet Take by mouth. 06/23/22   [provider]  Continuous Blood Gluc Receiver (DEXCOM G6 RECEIVER) DEVI 1 Device by Does not apply route as directed. 08/13/21   Shamleffer, Konrad Dolores, MD  Continuous Blood Gluc Sensor (DEXCOM G6 SENSOR) MISC 1 Device by Does not apply route as directed. 08/13/21   Shamleffer, Konrad Dolores, MD  Continuous Blood Gluc Transmit (DEXCOM G6 TRANSMITTER) MISC 1 Device by Does not apply route as directed. 08/13/21   Shamleffer, Konrad Dolores, MD  dapagliflozin propanediol (FARXIGA) 10 MG TABS tablet Take 1 tablet (10 mg total) by mouth daily before breakfast. 07/20/22   Shamleffer, Konrad Dolores, MD  Flaxseed Oil (LINSEED OIL) OIL  Daily, 0 Refill(s) 07/16/21   [provider]  Flaxseed, Linseed, (FLAXSEED OIL PO) Take 1 tablet by mouth daily.    [provider]  glipiZIDE  (GLUCOTROL) 5 MG tablet Take 0.5 tablets (2.5 mg total) by mouth daily before breakfast. 07/20/22   Shamleffer, Konrad Dolores, MD  hydrALAZINE (APRESOLINE) 25 MG tablet Take 25 mg by mouth every 6 (six) hours. 06/23/22   [provider]  lisinopril (ZESTRIL) 40 MG tablet Take 1 tablet (40 mg total) by mouth 2 (two) times daily. 01/31/22   Kathleene Hazel, MD  Multiple Vitamin (MULTIVITAMINS PO) Take 1 tablet by mouth daily.    [provider]  potassium chloride SA (KLOR-CON M) 20 MEQ tablet Take 2 tablets (40 mEq total) by mouth daily. 01/31/22   Kathleene Hazel, MD  spironolactone (ALDACTONE) 25 MG tablet Take 12.5 mg by mouth daily. 06/23/22   [provider]  torsemide (DEMADEX) 20 MG tablet Take 1 tablet (20 mg total) by mouth daily. Patient taking differently: Take 30 mg by mouth daily. 01/31/22   Kathleene Hazel, MD  warfarin (COUMADIN) 5 MG tablet Take 1 and 1/2 tablets daily by mouth or as directed by Anticoagulation Clinic. 04/07/22   Kathleene Hazel, MD    Family History    Family History  Problem Relation Age of Onset   Cancer Mother 82       leukemia   Heart disease Father    Heart attack Father 10   Lung cancer Brother    Colon cancer Neg Hx    Rectal cancer Neg Hx    He indicated that his mother is deceased. He indicated that his father is deceased. He indicated that his sister is deceased. He indicated that his brother is deceased. He indicated that his maternal grandmother is deceased. He indicated that his maternal grandfather is deceased. He indicated that his paternal grandmother is deceased. He indicated that his paternal grandfather is deceased. He indicated that all of his three daughters are alive. He indicated that the status of his neg hx is unknown.  Social History    Social History  Socioeconomic History   Marital status: Married    Spouse name: Not on file   Number of children: 3   Years of education:  Not on file   Highest education level: Not on file  Occupational History   Occupation: Medical sales representative buildings  Tobacco Use   Smoking status: Never   Smokeless tobacco: Former    Types: Chew    Quit date: 02/23/2014  Vaping Use   Vaping Use: Never used  Substance and Sexual Activity   Alcohol use: No   Drug use: No   Sexual activity: Never  Other Topics Concern   Not on file  Social History Narrative   Not on file   Social Determinants of Health   Financial Resource Strain: Not on file  Food Insecurity: No Food Insecurity (12/20/2022)   Hunger Vital Sign    Worried About Running Out of Food in the Last Year: Never true    Ran Out of Food in the Last Year: Never true  Transportation Needs: No Transportation Needs (12/20/2022)   PRAPARE - Administrator, Civil Service (Medical): No    Lack of Transportation (Non-Medical): No  Physical Activity: Not on file  Stress: Not on file  Social Connections: Not on file  Intimate Partner Violence: Not At Risk (12/15/2022)   Humiliation, Afraid, Rape, and Kick questionnaire    Fear of Current or Ex-Partner: No    Emotionally Abused: No    Physically Abused: No    Sexually Abused: No     Review of Systems    General:  No chills, fever, night sweats or weight changes.  Cardiovascular:  No chest pain, dyspnea on exertion, edema, orthopnea, palpitations, paroxysmal nocturnal dyspnea. Dermatological: No rash, lesions/masses Respiratory: No cough, dyspnea Urologic: No hematuria, dysuria Abdominal:   No nausea, vomiting, diarrhea, bright red blood per rectum, melena, or hematemesis Neurologic:  No visual changes, wkns, changes in mental status. All other systems reviewed and are otherwise negative except as noted above.  Physical Exam    VS:  BP 110/62 (BP Location: Left Arm, Patient Position: Sitting, Cuff Size: Normal)   Pulse 61   Ht 5\' 8"  (1.727 m)   Wt 207 lb (93.9 kg)   SpO2 96%   BMI 31.47 kg/m  , BMI Body mass  index is 31.47 kg/m. GEN: Well nourished, well developed, in no acute distress. HEENT: normal. Neck: Supple, no JVD, carotid bruits, or masses. Cardiac: RRR, no murmurs, rubs, or gallops. No clubbing, cyanosis, edema.  Radials/DP/PT 2+ and equal bilaterally.  Respiratory:  Respirations regular and unlabored, clear to auscultation bilaterally. GI: Soft, nontender, nondistended, BS + x 4. MS: no deformity or atrophy. Skin: warm and dry, no rash. Neuro:  Strength and sensation are intact. Psych: Normal affect.  Accessory Clinical Findings    Recent Labs: 12/18/2022: Magnesium 2.3 01/03/2023: ALT 21; Hemoglobin 9.9; Platelets 157 04/14/2023: BUN 27; Creatinine, Ser 1.69; Potassium 3.7; Sodium 144   Recent Lipid Panel    Component Value Date/Time   CHOL 101 11/12/2021 1438   CHOL 101 06/08/2016 0843   TRIG 208.0 (H) 11/12/2021 1438   HDL 37.10 (L) 11/12/2021 1438   HDL 37 (L) 06/08/2016 0843   CHOLHDL 3 11/12/2021 1438   VLDL 41.6 (H) 11/12/2021 1438   LDLCALC 38 04/13/2021 0947   LDLDIRECT 41.0 11/12/2021 1438    ECG personally reviewed by me today-none today.  EKG 07/27/2022 sinus bradycardia with occasional premature ventricular complexes ST and T wave abnormality  consider lateral ischemia 53 bpm  Echocardiogram 10/02/2019 IMPRESSIONS     1. Left ventricular ejection fraction, by visual estimation, is 50 to  55%. The left ventricle has normal function. There is moderately increased  left ventricular hypertrophy.   2. Left ventricular diastolic Doppler parameters are consistent with  impaired relaxation pattern of LV diastolic filling.   3. Global right ventricle has normal systolic function.The right  ventricular size is normal. No increase in right ventricular wall  thickness.   4. Left atrial size was normal.   5. Right atrial size was mildly dilated.   6. The mitral valve is normal in structure. No evidence of mitral valve  regurgitation.   7. The tricuspid valve is  normal in structure. Tricuspid valve  regurgitation was not visualized by color flow Doppler.   8. The aortic valve is tricuspid Aortic valve regurgitation was not  visualized by color flow Doppler. Mild to moderate aortic valve  sclerosis/calcification without any evidence of aortic stenosis.   9. The pulmonic valve was grossly normal. Pulmonic valve regurgitation is  mild by color flow Doppler.  10. The atrial septum is grossly normal.  Assessment & Plan   1.Essential hypertension-BP today 110/62.  Took blood pressure medication about 20 minutes prior to visit.  Well-controlled at home. Continue  carvedilol,   spironolactone, torsemide Heart healthy low-sodium diet-salty 6 again reviewed Increase physical activity as tolerated    Ischemic cardiomyopathy-Euvolemic today.  Weight today 207 pounds.  NYHA class II-3.  Echocardiogram 10/20 showed LVEF 50-55% and no significant valvular abnormalities.    Continue carvedilol, torsemide, Farxiga Heart healthy low-sodium diet Increase physical activity as tolerated   Coronary artery disease-denies chest pain.   Status post CABG x3 02/24/14. Continue aspirin, atorvastatin, carvedilol Heart healthy low-sodium diet-salty 6 given Increase physical activity as tolerated   Hyperlipidemia-LDL 38 on 04/13/2021  continue atorvastatin, aspirin, flaxseed Heart healthy low-sodium high-fiber diet Increase physical activity as tolerated Repeat fasting lipids and LFTs   Atrial fibrillation-heart rate today 61 bpm.  Compliant with warfarin.  Denies bleeding issues. Continue carvedilol, warfarin Heart healthy low-sodium diet Increase physical activity as tolerated   Essential hypertension-BP today 110/62.  Took blood pressure medication about 20 minutes prior to visit.  Well-controlled at home. Continue  carvedilol, lisinopril, hydralazine, clonidine, spironolactone Heart healthy low-sodium diet-salty 6 reviewed Increase physical activity as  tolerated   Disposition: Follow-up with  Dr.  Clifton James  in 6-9 months.   Thomasene Ripple. Yasmine Kilbourne NP-C     05/03/2023, 4:11 PM [facility] [address] Office [phone] Fax [phone]  Notice: This dictation was prepared with Dragon dictation along with smaller phrase technology. Any transcriptional errors that result from this process are unintentional and may not be corrected upon review.  I spent 14 minutes examining this patient, reviewing medications, and using patient centered shared decision making involving her cardiac care.  Prior to her visit I spent greater than 20 minutes reviewing her past medical history,  medications, and prior cardiac tests.

## 2023-05-03 ENCOUNTER — Ambulatory Visit: Payer: Medicare Other | Attending: General Practice | Admitting: General Practice

## 2023-05-03 ENCOUNTER — Encounter: Payer: Self-pay | Admitting: General Practice

## 2023-05-03 ENCOUNTER — Ambulatory Visit (INDEPENDENT_AMBULATORY_CARE_PROVIDER_SITE_OTHER): Payer: Medicare Other

## 2023-05-03 ENCOUNTER — Ambulatory Visit (INDEPENDENT_AMBULATORY_CARE_PROVIDER_SITE_OTHER): Payer: Medicare Other | Admitting: Podiatry

## 2023-05-03 VITALS — BP 110/62 | HR 61 | Ht 68.0 in | Wt 207.0 lb

## 2023-05-03 DIAGNOSIS — E782 Mixed hyperlipidemia: Secondary | ICD-10-CM | POA: Diagnosis present

## 2023-05-03 DIAGNOSIS — I2510 Atherosclerotic heart disease of native coronary artery without angina pectoris: Secondary | ICD-10-CM | POA: Diagnosis not present

## 2023-05-03 DIAGNOSIS — I10 Essential (primary) hypertension: Secondary | ICD-10-CM

## 2023-05-03 DIAGNOSIS — I480 Paroxysmal atrial fibrillation: Secondary | ICD-10-CM

## 2023-05-03 DIAGNOSIS — E1122 Type 2 diabetes mellitus with diabetic chronic kidney disease: Secondary | ICD-10-CM | POA: Diagnosis not present

## 2023-05-03 DIAGNOSIS — I255 Ischemic cardiomyopathy: Secondary | ICD-10-CM

## 2023-05-03 DIAGNOSIS — Z5181 Encounter for therapeutic drug level monitoring: Secondary | ICD-10-CM

## 2023-05-03 DIAGNOSIS — L97512 Non-pressure chronic ulcer of other part of right foot with fat layer exposed: Secondary | ICD-10-CM

## 2023-05-03 DIAGNOSIS — S98132A Complete traumatic amputation of one left lesser toe, initial encounter: Secondary | ICD-10-CM

## 2023-05-03 DIAGNOSIS — M2041 Other hammer toe(s) (acquired), right foot: Secondary | ICD-10-CM

## 2023-05-03 DIAGNOSIS — E1143 Type 2 diabetes mellitus with diabetic autonomic (poly)neuropathy: Secondary | ICD-10-CM

## 2023-05-03 DIAGNOSIS — E11621 Type 2 diabetes mellitus with foot ulcer: Secondary | ICD-10-CM

## 2023-05-03 DIAGNOSIS — I4891 Unspecified atrial fibrillation: Secondary | ICD-10-CM

## 2023-05-03 DIAGNOSIS — I5022 Chronic systolic (congestive) heart failure: Secondary | ICD-10-CM

## 2023-05-03 DIAGNOSIS — Z794 Long term (current) use of insulin: Secondary | ICD-10-CM

## 2023-05-03 LAB — POCT INR: INR: 2.3 (ref 2.0–3.0)

## 2023-05-03 NOTE — Patient Instructions (Signed)
Description   Continue taking 1 tablet daily EXCEPT 2 tablet on Sundays.   Stay consistent with greens each week (2-3 times per week).  Recheck INR in 4 weeks.  Coumadin Clinic [phone].       

## 2023-05-03 NOTE — Patient Instructions (Signed)
Medication Instructions:  The current medical regimen is effective;  continue present plan and medications as directed. Please refer to the Current Medication list given to you today.  *If you need a refill on your cardiac medications before your next appointment, please call your pharmacy*  Lab Work: NONE If you have labs (blood work) drawn today and your tests are completely normal, you will receive your results only by:  MyChart Message (if you have MyChart) OR  A paper copy in the mail If you have any lab test that is abnormal or we need to change your treatment, we will call you to review the results.  Other Instructions TAKE AND LOG YOUR WEIGHT DAILY  DO COUGHING AND DEEP BREATHING EXERCISES WHEN WHEEZING, ETC AS DISCUSSED  PLEASE READ AND FOLLOW ATTACHED  SALTY 6   Follow-Up: At [facility], you and your health needs are our priority.  As part of our continuing mission to provide you with exceptional heart care, we have created designated Provider Care Teams.  These Care Teams include your primary Cardiologist (physician) and Advanced Practice Providers (APPs -  Physician Assistants and Nurse Practitioners) who all work together to provide you with the care you need, when you need it.  Your next appointment:   9 month(s)  Provider:   Verne Carrow, MD ONLY

## 2023-05-04 ENCOUNTER — Other Ambulatory Visit: Payer: Self-pay | Admitting: Cardiovascular Disease

## 2023-05-04 DIAGNOSIS — I4891 Unspecified atrial fibrillation: Secondary | ICD-10-CM

## 2023-05-18 DIAGNOSIS — N183 Chronic kidney disease, stage 3 unspecified: Secondary | ICD-10-CM | POA: Insufficient documentation

## 2023-05-18 DIAGNOSIS — L0293 Carbuncle, unspecified: Secondary | ICD-10-CM | POA: Insufficient documentation

## 2023-05-31 ENCOUNTER — Ambulatory Visit: Payer: Medicare Other | Attending: Cardiovascular Disease | Admitting: *Deleted

## 2023-05-31 DIAGNOSIS — I480 Paroxysmal atrial fibrillation: Secondary | ICD-10-CM | POA: Diagnosis present

## 2023-05-31 DIAGNOSIS — Z5181 Encounter for therapeutic drug level monitoring: Secondary | ICD-10-CM | POA: Insufficient documentation

## 2023-05-31 DIAGNOSIS — I4891 Unspecified atrial fibrillation: Secondary | ICD-10-CM | POA: Diagnosis present

## 2023-05-31 LAB — POCT INR: INR: 2.5 (ref 2.0–3.0)

## 2023-05-31 NOTE — Patient Instructions (Signed)
Description   Continue taking warfarin 1 tablet daily EXCEPT 2 tablet on Sundays.   Stay consistent with greens each week (2-3 times per week).  Recheck INR in 5 weeks. Coumadin Clinic [phone].

## 2023-06-01 ENCOUNTER — Other Ambulatory Visit: Payer: Self-pay | Admitting: Internal Medicine

## 2023-07-05 ENCOUNTER — Ambulatory Visit: Payer: Medicare Other | Attending: Cardiology | Admitting: *Deleted

## 2023-07-05 DIAGNOSIS — Z5181 Encounter for therapeutic drug level monitoring: Secondary | ICD-10-CM

## 2023-07-05 DIAGNOSIS — I4891 Unspecified atrial fibrillation: Secondary | ICD-10-CM | POA: Diagnosis not present

## 2023-07-05 LAB — POCT INR: POC INR: 2.6

## 2023-07-05 NOTE — Patient Instructions (Signed)
Description   Continue taking warfarin 1 tablet daily EXCEPT 2 tablet on Sundays.   Stay consistent with greens each week (2-3 times per week).  Recheck INR in 6 weeks. Coumadin Clinic [phone].

## 2023-08-10 ENCOUNTER — Other Ambulatory Visit: Payer: Self-pay

## 2023-08-10 MED ORDER — DAPAGLIFLOZIN PROPANEDIOL 10 MG PO TABS: 10.0000 mg | ORAL_TABLET | Freq: Every day | ORAL | 3 refills | Status: DC

## 2023-08-11 ENCOUNTER — Ambulatory Visit: Payer: Medicare Other | Attending: Cardiology

## 2023-08-11 DIAGNOSIS — I4891 Unspecified atrial fibrillation: Secondary | ICD-10-CM

## 2023-08-11 DIAGNOSIS — Z5181 Encounter for therapeutic drug level monitoring: Secondary | ICD-10-CM

## 2023-08-11 DIAGNOSIS — I480 Paroxysmal atrial fibrillation: Secondary | ICD-10-CM | POA: Diagnosis not present

## 2023-08-11 LAB — POCT INR: INR: 3 (ref 2.0–3.0)

## 2023-08-11 NOTE — Patient Instructions (Signed)
Continue taking warfarin 1 tablet daily EXCEPT 2 tablet on Sundays.   Stay consistent with greens each week (2-3 times per week).  Recheck INR in 6 weeks. Coumadin Clinic [phone].

## 2023-08-15 ENCOUNTER — Other Ambulatory Visit: Payer: Self-pay

## 2023-08-23 NOTE — Progress Notes (Signed)
Patient presented to pick up diabetic shoe. Seen by the casting department and break-in instructions are documented in the chart.

## 2023-08-26 ENCOUNTER — Other Ambulatory Visit: Payer: Self-pay | Admitting: Internal Medicine

## 2023-09-13 ENCOUNTER — Encounter: Payer: Self-pay | Admitting: Internal Medicine

## 2023-09-13 ENCOUNTER — Ambulatory Visit (INDEPENDENT_AMBULATORY_CARE_PROVIDER_SITE_OTHER): Payer: Medicare Other | Admitting: Internal Medicine

## 2023-09-13 ENCOUNTER — Ambulatory Visit: Payer: Medicare Other | Attending: Cardiovascular Disease

## 2023-09-13 VITALS — BP 136/70 | HR 61 | Ht 68.0 in | Wt 215.2 lb

## 2023-09-13 DIAGNOSIS — N179 Acute kidney failure, unspecified: Secondary | ICD-10-CM | POA: Diagnosis not present

## 2023-09-13 DIAGNOSIS — Z5181 Encounter for therapeutic drug level monitoring: Secondary | ICD-10-CM | POA: Diagnosis not present

## 2023-09-13 DIAGNOSIS — E1165 Type 2 diabetes mellitus with hyperglycemia: Secondary | ICD-10-CM | POA: Diagnosis not present

## 2023-09-13 DIAGNOSIS — Z8546 Personal history of malignant neoplasm of prostate: Secondary | ICD-10-CM

## 2023-09-13 DIAGNOSIS — Z7984 Long term (current) use of oral hypoglycemic drugs: Secondary | ICD-10-CM | POA: Diagnosis not present

## 2023-09-13 DIAGNOSIS — I4891 Unspecified atrial fibrillation: Secondary | ICD-10-CM | POA: Insufficient documentation

## 2023-09-13 LAB — BASIC METABOLIC PANEL WITH GFR
BUN: 62 mg/dL — ABNORMAL HIGH (ref 6–23)
CO2: 27 meq/L (ref 19–32)
Calcium: 8.7 mg/dL (ref 8.4–10.5)
Chloride: 108 meq/L (ref 96–112)
Creatinine, Ser: 3.22 mg/dL — ABNORMAL HIGH (ref 0.40–1.50)
GFR: 17.39 mL/min — ABNORMAL LOW (ref >60.00)
Glucose, Bld: 189 mg/dL — ABNORMAL HIGH (ref 70–99)
Potassium: 3.9 meq/L (ref 3.5–5.1)
Sodium: 143 meq/L (ref 135–145)

## 2023-09-13 LAB — POCT GLYCOSYLATED HEMOGLOBIN (HGB A1C): Hemoglobin A1C: 7.4 % — AB (ref 4.0–5.6)

## 2023-09-13 LAB — MICROALBUMIN / CREATININE URINE RATIO
Creatinine,U: 25.7 mg/dL
Microalb Creat Ratio: 117.4 mg/g — ABNORMAL HIGH (ref 0.0–30.0)
Microalb, Ur: 30.2 mg/dL — ABNORMAL HIGH (ref 0.0–1.9)

## 2023-09-13 LAB — POCT INR: INR: 2.1 (ref 2.0–3.0)

## 2023-09-13 LAB — PSA: PSA: 3.13 ng/mL (ref 0.10–4.00)

## 2023-09-13 MED ORDER — REPAGLINIDE 0.5 MG PO TABS: 0.5000 mg | ORAL_TABLET | Freq: Every day | ORAL | 3 refills | Status: DC

## 2023-09-13 NOTE — Patient Instructions (Signed)
Description   Continue taking warfarin 1 tablet daily EXCEPT 2 tablet on Sundays.   Stay consistent with greens each week (2-3 times per week).  Recheck INR in 6 weeks. Coumadin Clinic [phone].

## 2023-09-13 NOTE — Patient Instructions (Addendum)
-   Start Repaglinide 0.5mg , 1 tablet before Breakfast  - STOP Glipizide 5 mg, Half a tablet before Breakfast  - Continue  Farxiga 10 mg daily    HOW TO TREAT LOW BLOOD SUGARS (Blood sugar LESS THAN 70 MG/DL) Please follow the RULE OF 15 for the treatment of hypoglycemia treatment (when your (blood sugars are less than 70 mg/dL)   STEP 1: Take 15 grams of carbohydrates when your blood sugar is low, which includes:  3-4 GLUCOSE TABS  OR 3-4 OZ OF JUICE OR REGULAR SODA OR ONE TUBE OF GLUCOSE GEL    STEP 2: RECHECK blood sugar in 15 MINUTES STEP 3: If your blood sugar is still low at the 15 minute recheck --> then, go back to STEP 1 and treat AGAIN with another 15 grams of carbohydrates.

## 2023-09-13 NOTE — Progress Notes (Unsigned)
Name: Seth [name]  Age/ Sex: 82 y.o., male   MRN/ DOB: [id number], September 29, 1941     PCP: Tollie Eth, NP   Reason for Endocrinology Evaluation: Type 2 Diabetes Mellitus  Initial Endocrine Consultative Visit: 10/09/2019    PATIENT IDENTIFIER: Seth [name] is a 82 y.o. male with a past medical history of T2DM, HTN, PVD, CAD, A.Fib and Prostate Ca ( S/P chemo and radiation). The patient has followed with Endocrinology clinic since 10/09/2019 for consultative assistance with management of his diabetes.      DIABETIC HISTORY:  Mr. Gullo was diagnosed with T2DM many years ago.  He was prescribed Jardiance in the past, but unable to use due to cost.  hemoglobin A1c has ranged from 6.4% in 2015, peaking at 11.7% in 2020 On his initial visit to our clinic his A1c was 9.7%, he was on glipizide, Toujeo, and metformin.  We switched his Toujeo to Humalog mix, we stopped glipizide and continued Metformin.  Lives with wife    Wife discontinued insulin mix 01/2022 due to hypoglycemia, he was started on glipizide and Farxiga as well as continuing metformin  Metformin discontinued 06/2022 due to low GFR  SUBJECTIVE:   During the last visit (02/21/2023): A1c 6.4%.    Today (09/13/2023): Seth [name] is here for a follow-up on diabetes management. Accompanied by wife  . He checks his blood sugars multiple times a day through the CGM. The patient has had hypoglycemic episodes since the last clinic visit.  These are mostly occurring during the day but also have been noted overnight.   He continues to follow-up with cardiology for PVD, ischemic cardiomyopathy and HTN He was seen by podiatry 05/03/2023  He has increased incontinence , has hx of prostate cancer, wife wants PSA as he is past due    HOME DIABETES REGIMEN:  Glipizide 5 mg, half a tablet before Breakfast  Farxiga 10 mg daily   Statin: Yes ACE-I/ARB: Yes     CONTINUOUS GLUCOSE MONITORING RECORD  INTERPRETATION   N/a      DIABETIC COMPLICATIONS: Microvascular complications:  Neuropathy , CKD III, S/P bilateral partial amputations (great toes) Denies:retinopathy  Last eye exam: Completed 12/2019   Macrovascular complications:  CAD, PVD Denies: CVA   HISTORY:  Past Medical History:  Past Medical History:  Diagnosis Date   Acidosis 07/19/2021   Acute on chronic diastolic (congestive) heart failure (HCC) 09/30/2014   AKI (acute kidney injury) (HCC) 12/15/2022   Atrial fibrillation (HCC)    a. coumadin;  b. Amiodarone   CAD (coronary artery disease)    a.  Lexiscan Myoview (02/14/14):  Apical, inferolateral scar; ? Mild ischemia; EF 32%.;  b.  LHC (02/21/14):  dLM 100%, LAD 100%, CFX 100%, dRCA 50%. 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 Echo (02/17/14):  EF 35-40%, anteroseptal and apical AK, Gr 2 DD, MAC, mod LAE.   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  Peripheral vascular disease, unspecified (HCC)    Personal history of nicotine dependence 07/19/2021   Prostate cancer (HCC)    Proteinuria 09/30/2009   Thrombocytopenia, unspecified (HCC)    Tobacco use disorder    Tobacco user 11/11/2009   Type 2 diabetes mellitus with diabetic peripheral angiopathy without gangrene (HCC) 07/19/2021   Type II or unspecified type diabetes mellitus with renal manifestations, uncontrolled(250.42)    Urolithiasis 12/26/1997   Past Surgical History:  Past  Surgical History:  Procedure Laterality Date   AMPUTATION TOE Bilateral 12/17/2022   Procedure: TOE AMPUTATION OF LEFT 1ST AND 2ND PARTIAL, AND RIGHT 1ST PARTIAL;  Surgeon: Edwin Cap, DPM;  Location: [facility];  Service: Podiatry;  Laterality: Bilateral;   BACK SURGERY     CATARACT EXTRACTION  2010   CLIPPING OF ATRIAL APPENDAGE Left 02/24/2014   Procedure: CLIPPING OF ATRIAL APPENDAGE;  Surgeon: Alleen Borne, MD;  Location: [facility];  Service: Open Heart Surgery;  Laterality: Left;   COLONOSCOPY  2005   Normal   CORONARY ARTERY BYPASS GRAFT N/A 02/24/2014   Procedure: Coronary artery bypass graft times three using left internal mammary artery and left leg greater saphenous vein harvested endoscopically.;  Surgeon: Alleen Borne, MD;  Location: [facility] OR;  Service: Open Heart Surgery;  Laterality: N/A;   INTRAOPERATIVE TRANSESOPHAGEAL ECHOCARDIOGRAM N/A 02/24/2014   Procedure: INTRAOPERATIVE TRANSESOPHAGEAL ECHOCARDIOGRAM;  Surgeon: Alleen Borne, MD;  Location: [facility] OR;  Service: Open Heart Surgery;  Laterality: N/A;   LEFT HEART CATHETERIZATION WITH CORONARY ANGIOGRAM N/A 02/21/2014   Procedure: LEFT HEART CATHETERIZATION WITH CORONARY ANGIOGRAM;  Surgeon: Peter M Swaziland, MD;  Location: [facility] C[facility];  Service: Cardiovascular;  Laterality: N/A;   Social History:  reports that he has never smoked. He quit smokeless tobacco use about 9 years ago.  His smokeless tobacco use included chew. He reports that he does not drink alcohol and does not use drugs. Family History:  Family History  Problem Relation Age of Onset   Cancer Mother 86       leukemia   Heart disease Father    Heart attack Father 60   Lung cancer Brother    Colon cancer Neg Hx    Rectal cancer Neg Hx      HOME MEDICATIONS: Allergies as of 09/13/2023   No Known Allergies      Medication List        Accurate as of September 13, 2023  1:49 PM. If you have any questions, ask your nurse or doctor.          aspirin EC 81 MG  tablet Take 81 mg by mouth daily. Swallow whole.   atorvastatin 40 MG tablet Commonly known as: LIPITOR Take 1 tablet (40 mg total) by mouth daily.   carvedilol 12.5 MG tablet Commonly known as: COREG Take 1 tablet (12.5 mg total) by mouth 2 (two) times daily with a meal.   citalopram 10 MG tablet Commonly known as: CELEXA Take 1 tablet (10 mg total) by mouth daily.   dapagliflozin propanediol 10 MG Tabs tablet Commonly known as: Farxiga Take 1 tablet (10 mg total) by mouth daily before breakfast.   Dexcom G6 Sensor Misc 1 Device by Does not apply route as directed.   Dexcom G6 Transmitter Misc 1 Device by Does not apply route as directed.   FLAXSEED OIL PO Take 1 tablet by mouth daily.   glipiZIDE 5 MG tablet Commonly known as: GLUCOTROL TAKE 1 TABLET BY MOUTH  ONCE DAILY BEFORE BREAKFAST   MULTIVITAMINS PO Take 1 tablet by mouth daily.   spironolactone 25 MG tablet Commonly known as: ALDACTONE Take 1 tablet (25 mg total) by mouth daily. What changed: additional instructions   torsemide 20 MG tablet Commonly known as: DEMADEX Take 1.5 tablets (30 mg total) by mouth daily. 1/2 tablet   Vitamin C 500 MG Caps Take 1 capsule by mouth daily. What changed: how much to take   vitamin E 200 UNIT capsule Take 200 Units by mouth daily.   warfarin 5 MG tablet Commonly known as: COUMADIN Take as directed by the anticoagulation clinic. If you are unsure how to take this medication, talk to your nurse or doctor. Original instructions: TAKE 1 TO 2 TABLETS BY MOUTH ONCE DAILY AS  DIRECTED  BY  ANTICOAGULATION  CLINIC         OBJECTIVE:   Vital Signs: BP 136/70   Pulse 61   Ht 5\' 8"  (1.727 m)   Wt 215 lb 3.2 oz (97.6 kg)   SpO2 97%   BMI 32.72 kg/m   Wt Readings from Last 3 Encounters:  09/13/23 215 lb 3.2 oz (97.6 kg)  05/03/23 207 lb (93.9 kg)  04/04/23 211 lb 3.2 oz (95.8 kg)     Exam: General: Pt appears well and is in NAD  Lungs: Clear with good BS  bilat   Heart: RRR   Extremities:  Trace  pretibial edema..  Neuro: MS is good with appropriate affect, pt is alert and Ox3       DM foot exam: 05/03/2023 per podiatry    DATA REVIEWED:  Lab Results  Component Value Date   HGBA1C 6.4 (A) 02/21/2023   HGBA1C 7.8 (H) 01/03/2023   HGBA1C 7.9 (H) 12/16/2022    Latest Reference Range & Units 04/14/23 13:29  Sodium 134 - 144 mmol/L 144  Potassium 3.5 - 5.2 mmol/L 3.7  Chloride 96 - 106 mmol/L 104  CO2 20 - 29 mmol/L 25  Glucose 70 - 99 mg/dL 811 (H)  BUN 8 - 27 mg/dL 27  Creatinine 9.14 - 7.82 mg/dL 9.56 (H)  Calcium 8.6 - 10.2 mg/dL 8.8  BUN/Creatinine Ratio 10 - 24  16  eGFR >59 mL/min/1.73 41 (L)  (H): Data is abnormally high (L): Data is abnormally low   ASSESSMENT / PLAN / RECOMMENDATIONS:   1) Type 2 Diabetes Mellitus, Sub-Optimally  controlled, With neuropathic, CKD III and macrovascular complications - Most recent A1c of 7.4%. Goal A1c <7.0%.  -His A1c has trended down from 7.9% to 6.4% -I have offered switching glipizide to repaglinide for short half-life, but since they have plenty at home we have opted to remain on this for now -His GFR has trended up -He is on patient assistance for Farxiga - Pt has no medicare plan D  MEDICATIONS:  Continue Glipizide 5 mg, half a tablet before breakfast Continue Farxiga 10 mg daily  EDUCATION / INSTRUCTIONS: BG monitoring instructions: Patient is instructed to check his blood sugars to times a day, before breakfast and supper. Call [facility] if: BG persistently < 70  I reviewed the Rule of 15 for the treatment of hypoglycemia in detail with the patient. Literature supplied.   2)Diabetic complications:  Eye: Does not have known diabetic retinopathy.  Neuro/ Feet: Does have known diabetic peripheral neuropathy. Renal: Patient does have known baseline CKD. He is on an ACEI/ARB at present.    F/U in 6 months     Signed  electronically by: Lyndle Herrlich, MD  [facility]  [facility] [address] [place] [place], [place] [address] Phone: [phone] FAX: [phone]   CC: Tollie Eth, NP [address] [place] [address] Phone: [phone]  Fax: [phone]  Return to Endocrinology clinic as below: Future Appointments  Date Time Provider Department Center  10/25/2023  1:15 PM [facility] None  02/02/2024  3:00 PM Kathleene Hazel, MD [facility]

## 2023-09-14 ENCOUNTER — Telehealth: Payer: Self-pay | Admitting: Internal Medicine

## 2023-09-14 NOTE — Telephone Encounter (Signed)
LMTCB

## 2023-09-14 NOTE — Telephone Encounter (Signed)
Patient spouse was advised and states and verbalized understanding.  She states that patient saw a urologist at [facility] urology over a year ago and saw a nephrologist but was told they didn't need to follow up because everything was stable. Would like to have 2 new referral placed if possible.  Lab appt scheduled for 10/16/23 because that's when patient will be back in office.

## 2023-09-14 NOTE — Telephone Encounter (Signed)
Please contact patient/spouse and let them know that patient's kidney function has deteriorated tremendously over the past 4 months  Creatinine went from 1.7 to 3.22 since April and GFR worsened from 49 to 17 .  Please asked the patient to hold [place] and just take repaglinide before breakfast for now, if his BG's continue to be elevated with the repaglinide they will need to contact us so I will increase it   Please see if the patient sees nephrology, if he does not have a nephrologist I will need to place a referral  Please encourage water intake   His PSA is detectable at 3, I have no baseline PSA on him but typically patients who have had history of prostate surgery and received treatment should have no PSA detectable  Patient will need to see urology   Please schedule the patient for repeat labs in 3 weeks to make sure his kidney is good and to decide if he needs to [place] or not    Thanks

## 2023-09-15 NOTE — Telephone Encounter (Signed)
Patient spouse will contact both office and get patient scheduled

## 2023-09-28 LAB — LAB REPORT - SCANNED: EGFR: 20

## 2023-09-28 LAB — BASIC METABOLIC PANEL: Glucose: 171

## 2023-10-02 ENCOUNTER — Other Ambulatory Visit: Payer: Self-pay | Admitting: Nurse Practitioner

## 2023-10-02 ENCOUNTER — Encounter: Payer: Self-pay | Admitting: Nurse Practitioner

## 2023-10-02 ENCOUNTER — Ambulatory Visit: Payer: Medicare Other | Admitting: Nurse Practitioner

## 2023-10-02 ENCOUNTER — Ambulatory Visit (HOSPITAL_BASED_OUTPATIENT_CLINIC_OR_DEPARTMENT_OTHER)
Admission: RE | Admit: 2023-10-02 | Discharge: 2023-10-02 | Disposition: A | Discharge status: Home / Self Care | Payer: Medicare Other | Source: Ambulatory Visit | Attending: Nurse Practitioner | Admitting: Nurse Practitioner

## 2023-10-02 VITALS — BP 112/68 | HR 56 | Wt 210.4 lb

## 2023-10-02 DIAGNOSIS — E1165 Type 2 diabetes mellitus with hyperglycemia: Secondary | ICD-10-CM

## 2023-10-02 DIAGNOSIS — E1122 Type 2 diabetes mellitus with diabetic chronic kidney disease: Secondary | ICD-10-CM

## 2023-10-02 DIAGNOSIS — W19XXXA Unspecified fall, initial encounter: Secondary | ICD-10-CM | POA: Diagnosis not present

## 2023-10-02 DIAGNOSIS — Z794 Long term (current) use of insulin: Secondary | ICD-10-CM | POA: Diagnosis not present

## 2023-10-02 DIAGNOSIS — N1831 Chronic kidney disease, stage 3a: Secondary | ICD-10-CM

## 2023-10-02 DIAGNOSIS — Y92009 Unspecified place in unspecified non-institutional (private) residence as the place of occurrence of the external cause: Secondary | ICD-10-CM | POA: Diagnosis not present

## 2023-10-02 DIAGNOSIS — R0789 Other chest pain: Secondary | ICD-10-CM | POA: Diagnosis present

## 2023-10-02 MED ORDER — TRAMADOL HCL 50 MG PO TABS
50.0000 mg | ORAL_TABLET | Freq: Three times a day (TID) | ORAL | 0 refills | Status: AC
Start: 2023-10-02 — End: 2023-10-07

## 2023-10-02 NOTE — Progress Notes (Signed)
Seth Eth, DNP, AGNP-c [facility] [address] [place], [place] [address] [phone]   ACUTE VISIT- ESTABLISHED PATIENT  Blood pressure 112/68, pulse (!) 56, weight 210 lb 6.4 oz (95.4 kg), SpO2 98%.  Subjective:  HPI Seth [name] is a 82 y.o. male presents to day for evaluation of acute concern(s).   History of Present Illness The patient, with a history of kidney disease and diabetes, presents with persistent chest pain following a fall onto a deck. The pain is localized in the center of the chest and back and has been present for approximately ten days. The patient reports no immediate pain after the fall, but began experiencing discomfort two days later, which has since persisted. The pain is severe enough to disrupt sleep, leading the patient to sleep in a recliner rather than a bed.  The patient also reports bruising on the left arm and hip, which is resolving. There were no witnesses to the fall, and the patient is unsure of the exact mechanism of injury. The patient denies any difficulty breathing, coughing, or blood in the sputum.  In addition to the chest pain, the patient's kidney function has reportedly declined significantly, as noted by a nephrologist.  The patient's diabetes management has also been challenging. The patient was recently switched from Glipizide to Prandin, which has resulted in unstable and high blood sugar levels. The patient's blood sugars have been consistently high, even reaching 250s, and the patient woke up with a blood sugar level of 180 on the day of the consultation. The patient also reports some weight loss since the medication switch.  The patient's mobility has decreased, and he reports feeling lethargic. The patient denies any weakness in the hands or legs.  ROS negative except for what is listed in HPI. History, Medications, Surgery, SDOH, and Family History reviewed and updated as appropriate.  Objective:   Physical Exam Vitals and nursing note reviewed.  Constitutional:      General: He is not in acute distress.    Appearance: He is obese. He is not ill-appearing.  HENT:     Head: Normocephalic.  Eyes:     Conjunctiva/sclera: Conjunctivae normal.  Neck:     Vascular: No carotid bruit.  Cardiovascular:     Rate and Rhythm: Normal rate and regular rhythm.     Pulses: Normal pulses.     Heart sounds: Normal heart sounds.  Pulmonary:     Effort: Pulmonary effort is normal. No respiratory distress.     Breath sounds: Normal breath sounds. No stridor. No wheezing, rhonchi or rales.       Comments: Generalized tenderness present to the thoracic spine area and sternum.  Chest:     Chest wall: Tenderness present.  Abdominal:     General: Bowel sounds are normal. There is no distension.     Palpations: Abdomen is soft.     Tenderness: There is no abdominal tenderness. There is no guarding.  Musculoskeletal:     Cervical back: Normal range of motion and neck supple.     Right lower leg: No edema.     Left lower leg: No edema.  Lymphadenopathy:     Cervical: No cervical adenopathy.  Skin:    General: Skin is warm and dry.     Capillary Refill: Capillary refill takes less than 2 seconds.  Neurological:     General: No focal deficit present.     Mental Status: He is alert and oriented to person, place, and  time.     Sensory: No sensory deficit.     Motor: No weakness.     Coordination: Coordination normal.     Gait: Gait abnormal.     Deep Tendon Reflexes: Reflexes normal.  Psychiatric:        Mood and Affect: Mood normal.          Assessment & Plan:   Problem List Items Addressed This Visit     Type 2 diabetes mellitus with stage 3a chronic kidney disease, with long-term current use of insulin (HCC)    Recent significant decline in renal function, currently awaiting results of extensive renal labs and renal ultrasound. -Review renal labs and ultrasound results when  available.      Type 2 diabetes mellitus with hyperglycemia, with long-term current use of insulin (HCC)    Recent switch from Glipizide to Prandin with worsening blood glucose control and possible associated lethargy and weight loss. -Contact endocrinologist to discuss possible return to Glipizide or other medication adjustments. -I have reached out to endocrinology and she is aware of the changes. At this time no recommendations were made on medications changes. Given this, if the symptoms persist, I recommend restart the glipizide and monitor closely, while reaching out to schedule follow-up sooner with endocrine.       Fall at home - Primary    Recent fall onto back with subsequent sternal and thoracic pain, tenderness on palpation, and difficulty sitting, standing, ro walking at this time. No respiratory symptoms. Consider possible rib fracture vs muscular bruising. There are no signs of bleeding, which is reassuring as he is on anticoagulant.  -Order chest x-ray 4 view to rule out rib fractures. -Prescribe Tramadol for pain management. -OK to continue to sleep in the chair if this is comfortable.  -If chest x-ray does not show fracture, recommend gentle stretching every day and the use of ice and/or heat for management.  -If no improvement at home in 2 weeks or so, we can consider PT for increased activity and management.          Seth Eth, DNP, AGNP-c

## 2023-10-02 NOTE — Patient Instructions (Signed)
Fall Prevention in the Home, Adult Falls can cause injuries and affect people of all ages. There are many simple things that you can do to make your home safe and to help prevent falls. If you need it, ask for help making these changes. What actions can I take to prevent falls? General information Use good lighting in all rooms. Make sure to: Replace any light bulbs that burn out. Turn on lights if it is dark and use night-lights. Keep items that you use often in easy-to-reach places. Lower the shelves around your home if needed. Move furniture so that there are clear paths around it. Do not keep throw rugs or other things on the floor that can make you trip. If any of your floors are uneven, fix them. Add color or contrast paint or tape to clearly mark and help you see: Grab bars or handrails. First and last steps of staircases. Where the edge of each step is. If you use a ladder or stepladder: Make sure that it is fully opened. Do not climb a closed ladder. Make sure the sides of the ladder are locked in place. Have someone hold the ladder while you use it. Know where your pets are as you move through your home. What can I do in the bathroom?     Keep the floor dry. Clean up any water that is on the floor right away. Remove soap buildup in the bathtub or shower. Buildup makes bathtubs and showers slippery. Use non-skid mats or decals on the floor of the bathtub or shower. Attach bath mats securely with double-sided, non-slip rug tape. If you need to sit down while you are in the shower, use a non-slip stool. Install grab bars by the toilet and in the bathtub and shower. Do not use towel bars as grab bars. What can I do in the bedroom? Make sure that you have a light by your bed that is easy to reach. Do not use any sheets or blankets on your bed that hang to the floor. Have a firm bench or chair with side arms that you can use for support when you get dressed. What can I do in  the kitchen? Clean up any spills right away. If you need to reach something above you, use a sturdy step stool that has a grab bar. Keep electrical cables out of the way. Do not use floor polish or wax that makes floors slippery. What can I do with my stairs? Do not leave anything on the stairs. Make sure that you have a light switch at the top and the bottom of the stairs. Have them installed if you do not have them. Make sure that there are handrails on both sides of the stairs. Fix handrails that are broken or loose. Make sure that handrails are as long as the staircases. Install non-slip stair treads on all stairs in your home if they do not have carpet. Avoid having throw rugs at the top or bottom of stairs, or secure the rugs with carpet tape to prevent them from moving. Choose a carpet design that does not hide the edge of steps on the stairs. Make sure that carpet is firmly attached to the stairs. Fix any carpet that is loose or worn. What can I do on the outside of my home? Use bright outdoor lighting. Repair the edges of walkways and driveways and fix any cracks. Clear paths of anything that can make you trip, such as tools or rocks. Add   color or contrast paint or tape to clearly mark and help you see high doorway thresholds. Trim any bushes or trees on the main path into your home. Check that handrails are securely fastened and in good repair. Both sides of all steps should have handrails. Install guardrails along the edges of any raised decks or porches. Have leaves, snow, and ice cleared regularly. Use sand, salt, or ice melt on walkways during winter months if you live where there is ice and snow. In the garage, clean up any spills right away, including grease or oil spills. What other actions can I take? Review your medicines with your health care provider. Some medicines can make you confused or feel dizzy. This can increase your chance of falling. Wear closed-toe shoes that  fit well and support your feet. Wear shoes that have rubber soles and low heels. Use a cane, walker, scooter, or crutches that help you move around if needed. Talk with your provider about other ways that you can decrease your risk of falls. This may include seeing a physical therapist to learn to do exercises to improve movement and strength. Where to find more information Centers for Disease Control and Prevention, [employer]: cdc.gov National Institute on Aging: nia.nih.gov National Institute on Aging: nia.nih.gov Contact a health care provider if: You are afraid of falling at home. You feel weak, drowsy, or dizzy at home. You fall at home. Get help right away if you: Lose consciousness or have trouble moving after a fall. Have a fall that causes a head injury. These symptoms may be an emergency. Get help right away. Call 911. Do not wait to see if the symptoms will go away. Do not drive yourself to the hospital. This information is not intended to replace advice given to you by your health care provider. Make sure you discuss any questions you have with your health care provider. Document Revised: 08/15/2022 Document Reviewed: 08/15/2022 Elsevier Patient Education  2024 Elsevier Inc.  

## 2023-10-03 ENCOUNTER — Encounter: Payer: Self-pay | Admitting: Nephrology

## 2023-10-04 ENCOUNTER — Other Ambulatory Visit: Payer: Self-pay | Admitting: Nephrology

## 2023-10-04 DIAGNOSIS — N1831 Chronic kidney disease, stage 3a: Secondary | ICD-10-CM

## 2023-10-04 DIAGNOSIS — N179 Acute kidney failure, unspecified: Secondary | ICD-10-CM

## 2023-10-05 ENCOUNTER — Telehealth: Payer: Self-pay | Admitting: Nurse Practitioner

## 2023-10-05 NOTE — Telephone Encounter (Signed)
Please call  Pt's wife called and wants to know status of xray results and she states Les Pou was checking with someone about his diabetes meds and wife would like call back today

## 2023-10-06 ENCOUNTER — Encounter: Payer: Self-pay | Admitting: Nurse Practitioner

## 2023-10-06 ENCOUNTER — Ambulatory Visit
Admission: RE | Admit: 2023-10-06 | Discharge: 2023-10-06 | Disposition: A | Discharge status: Home / Self Care | Payer: Medicare Other | Source: Ambulatory Visit | Attending: Nephrology | Admitting: Nephrology

## 2023-10-06 ENCOUNTER — Telehealth: Payer: Self-pay

## 2023-10-06 DIAGNOSIS — N179 Acute kidney failure, unspecified: Secondary | ICD-10-CM

## 2023-10-06 DIAGNOSIS — N1831 Chronic kidney disease, stage 3a: Secondary | ICD-10-CM

## 2023-10-06 MED ORDER — GLIPIZIDE 5 MG PO TABS: 5.0000 mg | ORAL_TABLET | Freq: Every day | ORAL | 3 refills | Status: DC

## 2023-10-06 NOTE — Telephone Encounter (Signed)
Mrs Bisig is aware

## 2023-10-06 NOTE — Telephone Encounter (Signed)
I spoke to Fajardo spouse of Ever and she states that since he started the repaglinide he has not done well, he is complaining of back pain chest pain muscle pain and blood sugars are running high , she states that Dr Arbie Cookey sent a message Monday in regards to him , wife is adamant that the repaglinide is the cause, Dr Lonzo Cloud please advise?

## 2023-10-08 ENCOUNTER — Other Ambulatory Visit: Payer: Self-pay

## 2023-10-08 ENCOUNTER — Emergency Department (HOSPITAL_BASED_OUTPATIENT_CLINIC_OR_DEPARTMENT_OTHER): Payer: Medicare Other

## 2023-10-08 ENCOUNTER — Emergency Department (HOSPITAL_BASED_OUTPATIENT_CLINIC_OR_DEPARTMENT_OTHER)
Admission: EM | Admit: 2023-10-08 | Discharge: 2023-10-08 | Disposition: A | Discharge status: Home / Self Care | Payer: Medicare Other | Attending: Emergency Medicine | Admitting: Emergency Medicine

## 2023-10-08 ENCOUNTER — Encounter (HOSPITAL_BASED_OUTPATIENT_CLINIC_OR_DEPARTMENT_OTHER): Payer: Self-pay

## 2023-10-08 DIAGNOSIS — Z7984 Long term (current) use of oral hypoglycemic drugs: Secondary | ICD-10-CM | POA: Insufficient documentation

## 2023-10-08 DIAGNOSIS — Z79899 Other long term (current) drug therapy: Secondary | ICD-10-CM | POA: Diagnosis not present

## 2023-10-08 DIAGNOSIS — I2510 Atherosclerotic heart disease of native coronary artery without angina pectoris: Secondary | ICD-10-CM | POA: Insufficient documentation

## 2023-10-08 DIAGNOSIS — R339 Retention of urine, unspecified: Secondary | ICD-10-CM | POA: Insufficient documentation

## 2023-10-08 DIAGNOSIS — Z7982 Long term (current) use of aspirin: Secondary | ICD-10-CM | POA: Diagnosis not present

## 2023-10-08 DIAGNOSIS — Z87891 Personal history of nicotine dependence: Secondary | ICD-10-CM | POA: Diagnosis not present

## 2023-10-08 DIAGNOSIS — N189 Chronic kidney disease, unspecified: Secondary | ICD-10-CM | POA: Insufficient documentation

## 2023-10-08 DIAGNOSIS — I130 Hypertensive heart and chronic kidney disease with heart failure and stage 1 through stage 4 chronic kidney disease, or unspecified chronic kidney disease: Secondary | ICD-10-CM | POA: Insufficient documentation

## 2023-10-08 DIAGNOSIS — E1122 Type 2 diabetes mellitus with diabetic chronic kidney disease: Secondary | ICD-10-CM | POA: Diagnosis not present

## 2023-10-08 DIAGNOSIS — Z8546 Personal history of malignant neoplasm of prostate: Secondary | ICD-10-CM | POA: Insufficient documentation

## 2023-10-08 DIAGNOSIS — Z7901 Long term (current) use of anticoagulants: Secondary | ICD-10-CM | POA: Insufficient documentation

## 2023-10-08 DIAGNOSIS — I5033 Acute on chronic diastolic (congestive) heart failure: Secondary | ICD-10-CM | POA: Diagnosis not present

## 2023-10-08 LAB — PROTIME-INR
INR: 2.8 — ABNORMAL HIGH (ref 0.8–1.2)
Prothrombin Time: 29.8 s — ABNORMAL HIGH (ref 11.4–15.2)

## 2023-10-08 LAB — BASIC METABOLIC PANEL
Anion gap: 8 (ref 5–15)
BUN: 66 mg/dL — ABNORMAL HIGH (ref 8–23)
CO2: 24 mmol/L (ref 22–32)
Calcium: 9.3 mg/dL (ref 8.9–10.3)
Chloride: 107 mmol/L (ref 98–111)
Creatinine, Ser: 2.69 mg/dL — ABNORMAL HIGH (ref 0.61–1.24)
GFR, Estimated: 23 mL/min — ABNORMAL LOW (ref >60)
Glucose, Bld: 139 mg/dL — ABNORMAL HIGH (ref 70–99)
Potassium: 3.8 mmol/L (ref 3.5–5.1)
Sodium: 139 mmol/L (ref 135–145)

## 2023-10-08 LAB — CBC
HCT: 32.8 % — ABNORMAL LOW (ref 39.0–52.0)
Hemoglobin: 10.5 g/dL — ABNORMAL LOW (ref 13.0–17.0)
MCH: 27.9 pg (ref 26.0–34.0)
MCHC: 32 g/dL (ref 30.0–36.0)
MCV: 87 fL (ref 80.0–100.0)
Platelets: 112 10*3/uL — ABNORMAL LOW (ref 150–400)
RBC: 3.77 MIL/uL — ABNORMAL LOW (ref 4.22–5.81)
RDW: 14.9 % (ref 11.5–15.5)
WBC: 8.6 10*3/uL (ref 4.0–10.5)
nRBC: 0 % (ref 0.0–0.2)

## 2023-10-08 LAB — CBG MONITORING, ED: Glucose-Capillary: 143 mg/dL — ABNORMAL HIGH (ref 70–99)

## 2023-10-08 LAB — TROPONIN I (HIGH SENSITIVITY)
Troponin I (High Sensitivity): 24 ng/L — ABNORMAL HIGH (ref <18)
Troponin I (High Sensitivity): 23 ng/L — ABNORMAL HIGH (ref <18)

## 2023-10-08 MED ORDER — CIPROFLOXACIN HCL 500 MG PO TABS
500.0000 mg | ORAL_TABLET | Freq: Once | ORAL | Status: AC
  Administered 2023-10-08: 500 mg via ORAL
  Filled 2023-10-08: qty 1

## 2023-10-08 MED ORDER — CARVEDILOL 12.5 MG PO TABS
12.5000 mg | ORAL_TABLET | Freq: Once | ORAL | Status: AC
  Administered 2023-10-08: 12.5 mg via ORAL
  Filled 2023-10-08: qty 1

## 2023-10-08 NOTE — ED Notes (Signed)
Pt is now sitting in recliner at bedside.

## 2023-10-08 NOTE — ED Provider Notes (Signed)
[place] [facility] Provider Note  CSN: [id number] Arrival date & time: 10/08/23 1332  Chief Complaint(s) Urinary Retention and Chest Pain  HPI Seth [name] is a 82 y.o. male history of diabetes, CHF, CKD, hypertension, hyperlipidemia, presenting with abnormal ultrasound.  Patient has recently had elevated creatinine which has been being worked up by his primary doctor.  His primary doctor obtained a renal and bladder ultrasound which showed lower urinary tract obstruction.  He was sent in for urinary catheter placement.  Does report some lower abdominal fullness.  No dysuria, foul-smelling urine, frequency.  Patient also reports some chest pain which has been ongoing for around a week.  Saw his primary doctor for this.  Symptoms are mild and improving.  Describes it as a burning sensation similar to heartburn.  No shortness of breath, nausea or vomiting, lightheadedness or dizziness.  Pain does not radiate.   Past Medical History Past Medical History:  Diagnosis Date   Acidosis 07/19/2021   Acute on chronic diastolic (congestive) heart failure (HCC) 09/30/2014   AKI (acute kidney injury) (HCC) 12/15/2022   Atrial fibrillation (HCC)    a. coumadin;  b. Amiodarone   CAD (coronary artery disease)    a.  Lexiscan Myoview (02/14/14):  Apical, inferolateral scar; ? Mild ischemia; EF 32%.;  b.  LHC (02/21/14):  dLM 100%, LAD 100%, CFX 100%, dRCA 50%. 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 Echo (02/17/14):  EF 35-40%, anteroseptal and apical AK, Gr 2 DD, MAC, mod LAE.   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) 07/19/2021   Type II or unspecified type diabetes mellitus with renal manifestations, uncontrolled(250.42)    Urolithiasis 12/26/1997   Patient Active Problem List   Diagnosis Date Noted   Type 2 diabetes mellitus with hyperglycemia, with long-term current use of insulin (HCC) 09/13/2023   H/O prostate cancer 09/13/2023   Imbalance 04/04/2023   Cut of skin of right elbow 04/04/2023   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   Forgetfulness 04/21/2022   Weakness generalized 10/05/2021   Suspected sleep apnea 07/27/2021   Anticoagulated on Coumadin 07/26/2021   Long term (current) use of insulin (HCC) 07/19/2021   Long term (current) use of oral hypoglycemic drugs 07/19/2021   Ventricular premature depolarization 07/19/2021   Shortness of breath 07/16/2021   Coronary artery disease of bypass graft of native heart with stable angina pectoris (HCC) 04/16/2021   Chronic systolic congestive heart failure (HCC) 02/08/2019   Ischemic cardiomyopathy 09/30/2014   Anemia 03/18/2014   Leukocytosis 03/18/2014   Encounter for therapeutic drug monitoring 03/17/2014   PAF (paroxysmal  atrial fibrillation) (HCC) 02/28/2014   Presence of aortocoronary bypass graft 02/24/2014   Unstable angina (HCC) 02/21/2014   Obesity, morbid (HCC) 05/22/2013   Type 2 diabetes mellitus with stage 3a chronic kidney disease, with long-term current use of insulin (HCC) 09/14/2010   PVD (peripheral vascular  disease) (HCC) 05/12/2010   Thrombocytopenia (HCC) 02/10/2010   Impotence, organic 10/08/2007   History of prostate cancer 10/08/2007   Hypertension associated with stage 3 chronic kidney disease due to type 2 diabetes mellitus (HCC) 10/27/2005   Coronary atherosclerosis 02/04/2000   Hyperlipidemia associated with type 2 diabetes mellitus (HCC) 04/26/1999   Home Medication(s) Prior to Admission medications   Medication Sig Start Date End Date Taking? Authorizing Provider  Ascorbic Acid (VITAMIN C) 500 MG CAPS Take 1 capsule by mouth daily. Patient taking differently: Take 2 capsules by mouth daily. 12/18/22   Amin, Ankit C, MD  aspirin EC 81 MG tablet Take 81 mg by mouth daily. Swallow whole.    [provider]  atorvastatin (LIPITOR) 40 MG tablet Take 1 tablet (40 mg total) by mouth daily. 03/15/23   Flossie Dibble, NP  carvedilol (COREG) 12.5 MG tablet Take 1 tablet (12.5 mg total) by mouth 2 (two) times daily with a meal. 03/15/23   Flossie Dibble, NP  citalopram (CELEXA) 10 MG tablet Take 1 tablet (10 mg total) by mouth daily. 01/03/23   Tollie Eth, NP  Continuous Blood Gluc Sensor (DEXCOM G6 SENSOR) MISC 1 Device by Does not apply route as directed. 08/13/21   Shamleffer, Konrad Dolores, MD  Continuous Blood Gluc Transmit (DEXCOM G6 TRANSMITTER) MISC 1 Device by Does not apply route as directed. 08/13/21   Shamleffer, Konrad Dolores, MD  Flaxseed, Linseed, (FLAXSEED OIL PO) Take 1 tablet by mouth daily.    [provider]  glipiZIDE (GLUCOTROL) 5 MG tablet Take 1 tablet (5 mg total) by mouth daily before breakfast. 10/06/23   Shamleffer, Konrad Dolores, MD  Multiple Vitamin (MULTIVITAMINS PO) Take 1 tablet by mouth daily.    [provider]  spironolactone (ALDACTONE) 25 MG tablet Take 1 tablet (25 mg total) by mouth daily. Patient taking differently: Take 25 mg by mouth daily. 1/2 tablet. 03/15/23 07/13/23  Flossie Dibble, NP  torsemide (DEMADEX) 20 MG  tablet Take 1.5 tablets (30 mg total) by mouth daily. 1/2 tablet 04/04/23 03/29/24  Tollie Eth, NP  vitamin E 200 UNIT capsule Take 200 Units by mouth daily.    [provider]  warfarin (COUMADIN) 5 MG tablet TAKE 1 TO 2 TABLETS BY MOUTH ONCE DAILY AS  DIRECTED  BY  ANTICOAGULATION  CLINIC 05/04/23   Kathleene Hazel, MD                                                                                                                                    Past Surgical History Past Surgical History:  Procedure  Laterality Date   AMPUTATION TOE Bilateral 12/17/2022   Procedure: TOE AMPUTATION OF LEFT 1ST AND 2ND PARTIAL, AND RIGHT 1ST PARTIAL;  Surgeon: Edwin Cap, DPM;  Location: [facility];  Service: Podiatry;  Laterality: Bilateral;   BACK SURGERY     CATARACT EXTRACTION  2010   CLIPPING OF ATRIAL APPENDAGE Left 02/24/2014   Procedure: CLIPPING OF ATRIAL APPENDAGE;  Surgeon: Alleen Borne, MD;  Location: [facility] OR;  Service: Open Heart Surgery;  Laterality: Left;   COLONOSCOPY  2005   Normal   CORONARY ARTERY BYPASS GRAFT N/A 02/24/2014   Procedure: Coronary artery bypass graft times three using left internal mammary artery and left leg greater saphenous vein harvested endoscopically.;  Surgeon: Alleen Borne, MD;  Location: [facility] OR;  Service: Open Heart Surgery;  Laterality: N/A;   INTRAOPERATIVE TRANSESOPHAGEAL ECHOCARDIOGRAM N/A 02/24/2014   Procedure: INTRAOPERATIVE TRANSESOPHAGEAL ECHOCARDIOGRAM;  Surgeon: Alleen Borne, MD;  Location: [facility] OR;  Service: Open Heart Surgery;  Laterality: N/A;   LEFT HEART CATHETERIZATION WITH CORONARY ANGIOGRAM N/A 02/21/2014   Procedure: LEFT HEART CATHETERIZATION WITH CORONARY ANGIOGRAM;  Surgeon: Peter M Swaz[place]and, MD;  Location: [facility] CATH LAB;  Service: Cardiovascular;  Laterality: N/A;   Family History Family History  Problem Relation Age of Onset   Cancer Mother 44       leukemia   Heart disease Father    Heart attack Father 77   Lung cancer Brother     Colon cancer Neg Hx    Rectal cancer Neg Hx     Social History Social History   Tobacco Use   Smoking status: Never   Smokeless tobacco: Former    Types: Chew    Quit date: 02/23/2014  Vaping Use   Vaping status: Never Used  Substance Use Topics   Alcohol use: No   Drug use: No   Allergies Patient has no known allergies.  Review of Systems Review of Systems  All other systems reviewed and are negative.   Physical Exam Vital Signs  I have reviewed the triage vital signs BP (!) 181/89   Pulse 66   Temp 97.9 F (36.6 C)   Resp 19   Ht 5\' 8"  (1.727 m)   Wt 95 kg   SpO2 98%   BMI 31.84 kg/m  Physical Exam Vitals and nursing note reviewed.  Constitutional:      General: He is not in acute distress.    Appearance: Normal appearance.  HENT:     Mouth/Throat:     Mouth: Mucous membranes are moist.  Eyes:     Conjunctiva/sclera: Conjunctivae normal.  Cardiovascular:     Rate and Rhythm: Normal rate and regular rhythm.  Pulmonary:     Effort: Pulmonary effort is normal. No respiratory distress.     Breath sounds: Normal breath sounds.  Abdominal:     General: Abdomen is flat.     Palpations: Abdomen is soft.     Tenderness: There is no abdominal tenderness.  Musculoskeletal:     Right lower leg: No edema.     Left lower leg: No edema.  Skin:    General: Skin is warm and dry.     Capillary Refill: Capillary refill takes less than 2 seconds.  Neurological:     Mental Status: He is alert and oriented to person, place, and time. Mental status is at baseline.  Psychiatric:        Mood and Affect: Mood normal.  Behavior: Behavior normal.     ED Results and Treatments Labs (all labs ordered are listed, but only abnormal results are displayed) Labs Reviewed  BASIC METABOLIC PANEL - Abnormal; Notable for the following components:      Result Value   Glucose, Bld 139 (*)    BUN 66 (*)    Creatinine, Ser 2.69 (*)    GFR, Estimated 23 (*)    All other  components within normal limits  CBC - Abnormal; Notable for the following components:   RBC 3.77 (*)    Hemoglobin 10.5 (*)    HCT 32.8 (*)    Platelets 112 (*)    All other components within normal limits  PROTIME-INR - Abnormal; Notable for the following components:   Prothrombin Time 29.8 (*)    INR 2.8 (*)    All other components within normal limits  CBG MONITORING, ED - Abnormal; Notable for the following components:   Glucose-Capillary 143 (*)    All other components within normal limits  TROPONIN I (HIGH SENSITIVITY) - Abnormal; Notable for the following components:   Troponin I (High Sensitivity) 24 (*)    All other components within normal limits  TROPONIN I (HIGH SENSITIVITY) - Abnormal; Notable for the following components:   Troponin I (High Sensitivity) 23 (*)    All other components within normal limits                                                                                                                          Radiology No results found.  Pertinent labs & imaging results that were available during my care of the patient were reviewed by me and considered in my medical decision making (see MDM for details).  Medications Ordered in ED Medications  ciprofloxacin (CIPRO) tablet 500 mg (500 mg Oral Given 10/08/23 1935)  carvedilol (COREG) tablet 12.5 mg (12.5 mg Oral Given 10/08/23 1935)                                                                                                                                     Procedures Procedures  (including critical care time)  Medical Decision Making / ED Course   MDM:  82 year old presenting to the emergency department with abnormal ultrasound.  Patient overall well-appearing, physical exam is reassuring.  I placed a Foley catheter with today as nursing was not able to place  a Foley catheter.  Patient did have elevated PVR.  Foley catheter draining clear urine. Creatinine appears similar to recent  baseline and is slightly improved if anything.  Recommended follow-up with urology.   Patient also complaining of some vague chest pain.  Low concern for ACS.  EKG not suggestive.  Troponin is minimally elevated, likely in the setting of CKD, will recheck.  If stable likely discharge.  Had recent workup including chest x-ray which was negative for any pneumonia, pneumothorax.  Will obtain repeat chest x-ray.  Doubt dissection, symptoms mild and present.  Doubt pulmonary embolism, patient chronically anticoagulated on warfarin. Clinical Course as of 10/08/23 1610  Wynelle Link Oct 08, 2023  1933 Delta troponin stable.  Chest x-ray shows some patchy infiltrates however patient clinically has no signs of pneumonia and his lungs are clear.  No cough, sputum production.  He will monitor symptoms at home and return if he develops any fevers or chills, cough, shortness of breath. Will discharge patient to home. All questions answered. Patient comfortable with plan of discharge. Return precautions discussed with patient and specified on the after visit summary.  [WS]    Clinical Course User Index [WS] Lonell Grandchild, MD     Additional history obtained: -Additional history obtained from ems -External records from outside source obtained and reviewed including: Chart review including previous notes, labs, imaging, consultation notes including prior notes    Lab Tests: -I ordered, reviewed, and interpreted labs.   The pertinent results include:   Labs Reviewed  BASIC METABOLIC PANEL - Abnormal; Notable for the following components:      Result Value   Glucose, Bld 139 (*)    BUN 66 (*)    Creatinine, Ser 2.69 (*)    GFR, Estimated 23 (*)    All other components within normal limits  CBC - Abnormal; Notable for the following components:   RBC 3.77 (*)    Hemoglobin 10.5 (*)    HCT 32.8 (*)    Platelets 112 (*)    All other components within normal limits  PROTIME-INR - Abnormal; Notable for the  following components:   Prothrombin Time 29.8 (*)    INR 2.8 (*)    All other components within normal limits  CBG MONITORING, ED - Abnormal; Notable for the following components:   Glucose-Capillary 143 (*)    All other components within normal limits  TROPONIN I (HIGH SENSITIVITY) - Abnormal; Notable for the following components:   Troponin I (High Sensitivity) 24 (*)    All other components within normal limits  TROPONIN I (HIGH SENSITIVITY) - Abnormal; Notable for the following components:   Troponin I (High Sensitivity) 23 (*)    All other components within normal limits    Notable for CKD. Nonspecific minimal troponin elevation. Therapeutic INR  Imaging Studies ordered: I ordered imaging studies including CXR On my interpretation imaging demonstrates nonspecific patchy infiltrates without clinical findings or symptoms of pneumonia I independently visualized and interpreted imaging. I agree with the radiologist interpretation   Medicines ordered and prescription drug management: Meds ordered this encounter  Medications   ciprofloxacin (CIPRO) tablet 500 mg   carvedilol (COREG) tablet 12.5 mg    -I have reviewed the patients home medicines and have made adjustments as needed   Social Determinants of Health:  Diagnosis or treatment significantly limited by social determinants of health: obesity   Reevaluation: After the interventions noted above, I reevaluated the patient and found that their symptoms have improved  Co morbidities that complicate the patient evaluation  Past Medical History:  Diagnosis Date   Acidosis 07/19/2021   Acute on chronic diastolic (congestive) heart failure (HCC) 09/30/2014   AKI (acute kidney injury) (HCC) 12/15/2022   Atrial fibrillation (HCC)    a. coumadin;  b. Amiodarone   CAD (coronary artery disease)    a.  Lexiscan Myoview (02/14/14):  Apical, inferolateral scar; ? Mild ischemia; EF 32%.;  b.  LHC (02/21/14):  dLM 100%, LAD 100%,  CFX 100%, dRCA 50%. 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 Echo (02/17/14):  EF 35-40%, anteroseptal and apical AK, Gr 2 DD, MAC, mod LAE.   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    Final Clinical Impression(s) / ED Diagnoses Final diagnoses:  Urinary retention     This chart was dictated using voice recognition software.  Despite best efforts to proofread,  errors can occur which can change the documentation meaning.    Lonell Grandchild, MD 10/08/23 [phone]

## 2023-10-08 NOTE — ED Notes (Signed)
Patient requesting to move out of the bed.Recliner brought into room and patient assisted with RN and tech to the recliner. Pt on monitor, call bell in reach.

## 2023-10-08 NOTE — ED Triage Notes (Addendum)
The patient was seen on Friday and had an Ultrasound done on his kidneys. The patient stated his urology sent him here for a urinary catheter due to an obstruction. She stated his lab work was also abnormal. He is also having chest pain.

## 2023-10-08 NOTE — Discharge Instructions (Addendum)
We evaluated you for your urinary retention.  We were able to place a Foley catheter.  Please follow-up with the urologist so that they can determine when this can be removed.  We also evaluated you for your chest pain.  Your laboratory testing was reassuring.  Your troponin test was fairly elevated but stable on recheck.  I doubt your symptoms are related to any heart issues.  Your chest x-ray had findings which could possibly represent a pneumonia but you are not having any symptoms and your lung exam was normal.  Keep an eye out for any symptoms such as cough, shortness of breath.  If you do develop the symptoms please return to the emergency department  Please also return to the emergency department if you develop any new symptoms such as fevers or chills, nausea or vomiting, abdominal pain, lack of drainage from the catheter, weakness, severe chest pain, or any other new symptoms.

## 2023-10-16 ENCOUNTER — Other Ambulatory Visit: Payer: Self-pay | Admitting: Cardiovascular Disease

## 2023-10-16 ENCOUNTER — Other Ambulatory Visit (INDEPENDENT_AMBULATORY_CARE_PROVIDER_SITE_OTHER): Payer: Medicare Other

## 2023-10-16 DIAGNOSIS — I4891 Unspecified atrial fibrillation: Secondary | ICD-10-CM

## 2023-10-16 DIAGNOSIS — N179 Acute kidney failure, unspecified: Secondary | ICD-10-CM

## 2023-10-17 DIAGNOSIS — Y92009 Unspecified place in unspecified non-institutional (private) residence as the place of occurrence of the external cause: Secondary | ICD-10-CM | POA: Insufficient documentation

## 2023-10-17 LAB — BASIC METABOLIC PANEL
BUN: 60 mg/dL — ABNORMAL HIGH (ref 6–23)
CO2: 26 meq/L (ref 19–32)
Calcium: 9.2 mg/dL (ref 8.4–10.5)
Chloride: 101 meq/L (ref 96–112)
Creatinine, Ser: 2.39 mg/dL — ABNORMAL HIGH (ref 0.40–1.50)
GFR: 24.85 mL/min — ABNORMAL LOW (ref >60.00)
Glucose, Bld: 247 mg/dL — ABNORMAL HIGH (ref 70–99)
Potassium: 3.6 meq/L (ref 3.5–5.1)
Sodium: 139 meq/L (ref 135–145)

## 2023-10-17 NOTE — Assessment & Plan Note (Signed)
Recent switch from Glipizide to Prandin with worsening blood glucose control and possible associated lethargy and weight loss. -Contact endocrinologist to discuss possible return to Glipizide or other medication adjustments. -I have reached out to endocrinology and she is aware of the changes. At this time no recommendations were made on medications changes. Given this, if the symptoms persist, I recommend restart the glipizide and monitor closely, while reaching out to schedule follow-up sooner with endocrine.

## 2023-10-17 NOTE — Assessment & Plan Note (Signed)
Recent fall onto back with subsequent sternal and thoracic pain, tenderness on palpation, and difficulty sitting, standing, ro walking at this time. No respiratory symptoms. Consider possible rib fracture vs muscular bruising. There are no signs of bleeding, which is reassuring as he is on anticoagulant.  -Order chest x-ray 4 view to rule out rib fractures. -Prescribe Tramadol for pain management. -OK to continue to sleep in the chair if this is comfortable.  -If chest x-ray does not show fracture, recommend gentle stretching every day and the use of ice and/or heat for management.  -If no improvement at home in 2 weeks or so, we can consider PT for increased activity and management.

## 2023-10-17 NOTE — Assessment & Plan Note (Signed)
Recent significant decline in renal function, currently awaiting results of extensive renal labs and renal ultrasound. -Review renal labs and ultrasound results when available.

## 2023-10-18 ENCOUNTER — Other Ambulatory Visit: Payer: Self-pay

## 2023-10-18 DIAGNOSIS — I4891 Unspecified atrial fibrillation: Secondary | ICD-10-CM

## 2023-10-18 MED ORDER — WARFARIN SODIUM 5 MG PO TABS: ORAL_TABLET | ORAL | 0 refills | Status: DC

## 2023-10-18 NOTE — Telephone Encounter (Signed)
Received call from pharmacy stating they received a refill for Warfarin; however, the prescription was for 200 tablets and a note was not placed on the prescription if this was a 30 or 90 day supply. Pt's current Warfarin dose is 1 tablet daily except 2 tablets on Sundays. A 90 day supply would be 100 tablets. Re-sent refill to pharmacy with appropriate quantity.

## 2023-10-25 ENCOUNTER — Other Ambulatory Visit (HOSPITAL_COMMUNITY): Payer: Self-pay | Admitting: Urology

## 2023-10-25 ENCOUNTER — Ambulatory Visit: Payer: Medicare Other | Attending: Cardiology

## 2023-10-25 DIAGNOSIS — I4891 Unspecified atrial fibrillation: Secondary | ICD-10-CM

## 2023-10-25 DIAGNOSIS — R9721 Rising PSA following treatment for malignant neoplasm of prostate: Secondary | ICD-10-CM

## 2023-10-25 DIAGNOSIS — Z5181 Encounter for therapeutic drug level monitoring: Secondary | ICD-10-CM | POA: Diagnosis not present

## 2023-10-25 DIAGNOSIS — Z8546 Personal history of malignant neoplasm of prostate: Secondary | ICD-10-CM

## 2023-10-25 NOTE — Patient Instructions (Signed)
Description   Continue taking warfarin 1 tablet daily EXCEPT 2 tablet on Sundays.   Stay consistent with greens each week (2-3 times per week).  Recheck INR in 6 weeks. Coumadin Clinic [phone].

## 2023-11-04 ENCOUNTER — Other Ambulatory Visit (HOSPITAL_BASED_OUTPATIENT_CLINIC_OR_DEPARTMENT_OTHER): Payer: Self-pay | Admitting: Family

## 2023-11-09 LAB — CBC AND DIFFERENTIAL
HCT: 31 — AB (ref 41–53)
Hemoglobin: 9.9 — AB (ref 13.5–17.5)
Neutrophils Absolute: 5.4
Platelets: 143 10*3/uL — AB (ref 150–400)
WBC: 8.5

## 2023-11-09 LAB — COMPREHENSIVE METABOLIC PANEL
Albumin: 3.5 (ref 3.5–5.0)
Calcium: 9.4 (ref 8.7–10.7)
eGFR: 33

## 2023-11-09 LAB — BASIC METABOLIC PANEL WITH GFR
BUN: 56 — AB (ref 4–21)
CO2: 24 — AB (ref 13–22)
Chloride: 105 (ref 99–108)
Creatinine: 2 — AB (ref 0.6–1.3)
Glucose: 162
Potassium: 4.8 meq/L (ref 3.5–5.1)
Sodium: 144 (ref 137–147)

## 2023-11-09 LAB — CBC: RBC: 3.5 — AB (ref 3.87–5.11)

## 2023-11-10 ENCOUNTER — Encounter (HOSPITAL_COMMUNITY)
Admission: RE | Admit: 2023-11-10 | Discharge: 2023-11-10 | Disposition: A | Discharge status: Home / Self Care | Payer: Medicare Other | Source: Ambulatory Visit | Attending: Urology | Admitting: Urology

## 2023-11-10 DIAGNOSIS — R9721 Rising PSA following treatment for malignant neoplasm of prostate: Secondary | ICD-10-CM | POA: Diagnosis present

## 2023-11-10 DIAGNOSIS — Z8546 Personal history of malignant neoplasm of prostate: Secondary | ICD-10-CM | POA: Insufficient documentation

## 2023-11-10 LAB — LAB REPORT - SCANNED
Creatinine, POC: 25.5 mg/dL
EGFR: 33

## 2023-11-10 MED ORDER — FLOTUFOLASTAT F 18 GALLIUM 296-5846 MBQ/ML IV SOLN
8.4800 | Freq: Once | INTRAVENOUS | Status: AC
  Administered 2023-11-10: 8.48 via INTRAVENOUS

## 2023-12-06 ENCOUNTER — Ambulatory Visit: Payer: Medicare Other | Attending: Cardiology

## 2023-12-06 DIAGNOSIS — Z5181 Encounter for therapeutic drug level monitoring: Secondary | ICD-10-CM | POA: Diagnosis present

## 2023-12-06 DIAGNOSIS — I4891 Unspecified atrial fibrillation: Secondary | ICD-10-CM | POA: Insufficient documentation

## 2023-12-06 LAB — POCT INR: INR: 2.2 (ref 2.0–3.0)

## 2023-12-06 NOTE — Patient Instructions (Signed)
Description   Continue taking warfarin 1 tablet daily EXCEPT 2 tablet on Sundays.   Stay consistent with greens each week (2-3 times per week).  Recheck INR in 6 weeks. Coumadin Clinic [phone].

## 2023-12-11 ENCOUNTER — Encounter: Payer: Self-pay | Admitting: Nurse Practitioner

## 2024-01-17 ENCOUNTER — Other Ambulatory Visit: Payer: Self-pay | Admitting: Nurse Practitioner

## 2024-01-17 ENCOUNTER — Ambulatory Visit: Payer: Medicare Other | Attending: Cardiology

## 2024-01-17 DIAGNOSIS — Z5181 Encounter for therapeutic drug level monitoring: Secondary | ICD-10-CM

## 2024-01-17 DIAGNOSIS — I4891 Unspecified atrial fibrillation: Secondary | ICD-10-CM

## 2024-01-17 LAB — POCT INR: INR: 1.5 — AB (ref 2.0–3.0)

## 2024-01-17 NOTE — Patient Instructions (Signed)
Description   Take 2 tablets today and then continue taking warfarin 1 tablet daily EXCEPT 2 tablet on Sundays.   Stay consistent with greens each week (2-3 times per week).  Recheck INR in 4 weeks. [facility] [phone].

## 2024-01-31 ENCOUNTER — Ambulatory Visit: Payer: Medicare Other | Attending: Cardiology | Admitting: *Deleted

## 2024-01-31 DIAGNOSIS — Z5181 Encounter for therapeutic drug level monitoring: Secondary | ICD-10-CM | POA: Insufficient documentation

## 2024-01-31 DIAGNOSIS — I4891 Unspecified atrial fibrillation: Secondary | ICD-10-CM | POA: Diagnosis not present

## 2024-01-31 DIAGNOSIS — I480 Paroxysmal atrial fibrillation: Secondary | ICD-10-CM | POA: Insufficient documentation

## 2024-01-31 LAB — POCT INR: INR: 1.8 — AB (ref 2.0–3.0)

## 2024-01-31 NOTE — Patient Instructions (Signed)
 Description   Take 2 tablets today and then START taking warfarin 1 tablet daily EXCEPT 2 tablet on Sundays and Wednesdays.   Stay consistent with greens each week (2-3 times per week).  Recheck INR in 3 weeks. [facility]in  [facility] [phone].

## 2024-02-02 ENCOUNTER — Ambulatory Visit (INDEPENDENT_AMBULATORY_CARE_PROVIDER_SITE_OTHER): Payer: Medicare Other | Admitting: Internal Medicine

## 2024-02-02 ENCOUNTER — Encounter: Payer: Self-pay | Admitting: Cardiovascular Disease

## 2024-02-02 ENCOUNTER — Ambulatory Visit: Payer: Medicare Other | Attending: Cardiovascular Disease | Admitting: Cardiovascular Disease

## 2024-02-02 ENCOUNTER — Encounter: Payer: Self-pay | Admitting: Internal Medicine

## 2024-02-02 VITALS — BP 102/60 | HR 63 | Ht 68.0 in | Wt 213.0 lb

## 2024-02-02 VITALS — BP 140/82 | HR 76 | Ht 68.0 in | Wt 213.0 lb

## 2024-02-02 DIAGNOSIS — I480 Paroxysmal atrial fibrillation: Secondary | ICD-10-CM | POA: Diagnosis present

## 2024-02-02 DIAGNOSIS — I2510 Atherosclerotic heart disease of native coronary artery without angina pectoris: Secondary | ICD-10-CM | POA: Diagnosis present

## 2024-02-02 DIAGNOSIS — E782 Mixed hyperlipidemia: Secondary | ICD-10-CM | POA: Diagnosis present

## 2024-02-02 DIAGNOSIS — I10 Essential (primary) hypertension: Secondary | ICD-10-CM | POA: Diagnosis present

## 2024-02-02 DIAGNOSIS — E1152 Type 2 diabetes mellitus with diabetic peripheral angiopathy with gangrene: Secondary | ICD-10-CM

## 2024-02-02 DIAGNOSIS — I255 Ischemic cardiomyopathy: Secondary | ICD-10-CM | POA: Insufficient documentation

## 2024-02-02 DIAGNOSIS — E1143 Type 2 diabetes mellitus with diabetic autonomic (poly)neuropathy: Secondary | ICD-10-CM

## 2024-02-02 DIAGNOSIS — E1165 Type 2 diabetes mellitus with hyperglycemia: Secondary | ICD-10-CM | POA: Diagnosis not present

## 2024-02-02 DIAGNOSIS — N1831 Chronic kidney disease, stage 3a: Secondary | ICD-10-CM

## 2024-02-02 DIAGNOSIS — Z794 Long term (current) use of insulin: Secondary | ICD-10-CM | POA: Diagnosis not present

## 2024-02-02 DIAGNOSIS — E1122 Type 2 diabetes mellitus with diabetic chronic kidney disease: Secondary | ICD-10-CM

## 2024-02-02 LAB — POCT GLYCOSYLATED HEMOGLOBIN (HGB A1C): Hemoglobin A1C: 7.6 % — AB (ref 4.0–5.6)

## 2024-02-02 MED ORDER — GLIPIZIDE 5 MG PO TABS: 5.0000 mg | ORAL_TABLET | Freq: Two times a day (BID) | ORAL | 3 refills | Status: DC

## 2024-02-02 MED ORDER — CARVEDILOL 12.5 MG PO TABS: 12.5000 mg | ORAL_TABLET | Freq: Two times a day (BID) | ORAL | 3 refills | Status: DC

## 2024-02-02 MED ORDER — DEXCOM G7 SENSOR MISC: 1.0000 | 3 refills | Status: DC

## 2024-02-02 MED ORDER — ATORVASTATIN CALCIUM 40 MG PO TABS: 40.0000 mg | ORAL_TABLET | Freq: Every day | ORAL | 3 refills | Status: DC

## 2024-02-02 NOTE — Progress Notes (Signed)
 Chief Complaint  Patient presents with   Follow-up    CAD   History of Present Illness: 83  yo male with a history of paroxysmal atrial fibrillation, DM, HTN, HLD, CAD s/p CABG, chronic diastolic CHF, prostate CA, renal insufficiency and obesity here today for cardiac follow up. He was seen as a new patient 02/12/14 for evaluation of atrial fibrillation and was placed on Xarelto . Stress test was abnormal with apical and inferolateral scar and ischemia, LVEF of 32%. Echo 02/17/14 with LVEF=35-40% with akinesis of the mid-distalanteroseptal and apical myocardium, MAC, dilated LA. Cardiac cath 02/21/14 with occluded left main, moderate RCA stenosis with the entire left system filling from right to left collaterals. 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 Echo 05/28/14 with LVEF=35-40%, concentric LVH, mild MR. Echo October 2020 with LVEF=50-55%, LVH, no significant valve disease. He was admitted ot [facility] in August 2022 with CHF. He was diuresed and lasix  changed to Torsemide . Outside echo Sept 2022 with LVEF=63%. He was admitted to an outside hospital in August 2023 with volume overload.  He was diagnosed with metastatic prostate cancer and is being treated at [facility].    He is here today for follow up. The patient denies any chest pain, dyspnea, palpitations, lower extremity edema, orthopnea, PND, dizziness, near syncope or syncope.    Primary Care Physician: Oris Camie BRAVO, NP  Past Medical History:  Diagnosis Date   Acidosis 07/19/2021   Acute on chronic diastolic (congestive) heart failure (HCC) 09/30/2014   AKI (acute kidney injury) (HCC) 12/15/2022   Atrial fibrillation (HCC)    a. coumadin ;  b. Amiodarone    CAD (coronary artery disease)    a.  Lexiscan  Myoview (02/14/14):  Apical, inferolateral scar; ? Mild ischemia; EF 32%.;  b.  LHC (02/21/14):  dLM 100%, LAD 100%, CFX 100%, dRCA 50%. 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 Echo (02/17/14):  EF 35-40%, anteroseptal and apical AK, Gr 2 DD, MAC, mod LAE.   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) 07/19/2021   Type II or unspecified type diabetes mellitus with renal manifestations, uncontrolled(250.42)    Urolithiasis 12/26/1997    Past Surgical History:  Procedure Laterality Date   AMPUTATION TOE Bilateral 12/17/2022   Procedure: TOE AMPUTATION OF LEFT 1ST AND 2ND PARTIAL, AND RIGHT 1ST PARTIAL;  Surgeon: Silva Juliene SAUNDERS, DPM;  Location: [facility];  Service: Podiatry;  Laterality: Bilateral;   BACK SURGERY     CATARACT EXTRACTION  2010   CLIPPING OF ATRIAL APPENDAGE Left 02/24/2014   Procedure: CLIPPING OF ATRIAL APPENDAGE;  Surgeon: Dorise MARLA Fellers, MD;  Location[facility];  Service: Open Heart Surgery;  Laterality: Left;   COLONOSCOPY  2005   Normal   CORONARY ARTERY BYPASS GRAFT N/A 02/24/2014   Procedure: Coronary artery bypass graft times three using left internal mammary artery and left leg greater saphenous vein harvested endoscopically.;  Surgeon: Dorise MARLA Fellers, MD;  Location: [facility] OR;  Service: Open Heart Surgery;  Laterality: N/A;    INTRAOPERATIVE TRANSESOPHAGEAL ECHOCARDIOGRAM N/A 02/24/2014   Procedure: INTRAOPERATIVE TRANSESOPHAGEAL ECHOCARDIOGRAM;  Surgeon: Dorise MARLA Fellers, MD;  Location: [facility] OR;  Service: Open Heart Surgery;  Laterality: N/A;   LEFT HEART CATHETERIZATION WITH CORONARY ANGIOGRAM N/A 02/21/2014   Procedure: LEFT HEART CATHETERIZATION WITH CORONARY ANGIOGRAM;  Surgeon: Peter M Jordan, MD;  Location: [facility] CATH LAB;  Service: Cardiovascular;  Laterality: N/A;    Current Outpatient Medications  Medication Sig Dispense Refill   Ascorbic Acid  (VITAMIN C ) 500 MG CAPS Take 1 capsule by mouth daily. (Patient taking differently: Take 2 capsules by mouth daily.) 30 capsule 0   aspirin  EC 81 MG tablet Take 81 mg by mouth daily. Swallow whole.     Calcium  Carbonate-Vitamin D  600-5 MG-MCG CAPS 1 cap, PO (by Mouth), Daily, 0 Refill(s)     citalopram  (CELEXA ) 10 MG tablet Take 1 tablet (10 mg total) by mouth daily. 90 tablet 3   Continuous Glucose Sensor (DEXCOM G7 SENSOR) MISC 1 Device by Does not apply route as directed. 9 each 3   ferrous sulfate 325 (65 FE) MG tablet Take by mouth.     Flaxseed, Linseed, (FLAXSEED OIL PO) Take 1 tablet by mouth daily.     glipiZIDE  (GLUCOTROL ) 5 MG tablet Take 1 tablet (5 mg total) by mouth 2 (two) times daily before a meal. 180 tablet 3   Multiple Vitamin (MULTIVITAMINS PO) Take 1 tablet by mouth daily.     predniSONE (DELTASONE) 5 MG tablet Take 1 tablet by mouth daily.     spironolactone  (ALDACTONE ) 25 MG tablet Take 1/2 (one-half) tablet by mouth once daily 45 tablet 0   tamsulosin (FLOMAX) 0.4 MG CAPS capsule Take by mouth.     torsemide  (DEMADEX ) 20 MG tablet Take 1 tablet by mouth once daily 90 tablet 1   vitamin E 200 UNIT capsule Take 200 Units by mouth daily.     warfarin (COUMADIN ) 5 MG tablet TAKE 1 TABLET TO 2 TABLETS BY MOUTH ONCE DAILY AS  DIRECTED  BY  ANTICOAGULATION  CLINIC 100 tablet 0   atorvastatin  (LIPITOR) 40 MG tablet Take 1 tablet (40 mg total) by mouth daily.  90 tablet 3   carvedilol  (COREG ) 12.5 MG tablet Take 1 tablet (12.5 mg total) by mouth 2 (two) times daily with a meal. 180 tablet 3   No current facility-administered medications for this visit.    Allergies  Allergen Reactions   Niacin     Social History   Socioeconomic History   Marital status: Married    Spouse name: Not on file   Number of children: 3   Years of education: Not on file   Highest education level: Not on file  Occupational History   Occupation: Medical sales representative buildings  Tobacco Use   Smoking status: Never   Smokeless tobacco: Former    Types: Chew    Quit date: 02/23/2014  Vaping Use   Vaping status: Never Used  Substance and Sexual Activity   Alcohol use: No   Drug use: No   Sexual activity: Never  Other Topics Concern   Not on file  Social History Narrative   Not on file   Social Drivers of Health   Financial Resource Strain: Not on file  Food Insecurity: No Food Insecurity (12/20/2022)   Hunger Vital Sign    Worried About Running Out of Food in the Last Year: Never true    Ran Out of Food in the Last Year: Never true  Transportation Needs: No Transportation Needs (12/20/2022)   PRAPARE - Administrator, Civil Service (Medical): No    Lack of Transportation (Non-Medical): No  Physical Activity: Not on file  Stress: Not on file  Social Connections: Not on file  Intimate Partner Violence: Not At Risk (12/15/2022)   Humiliation, Afraid, Rape, and Kick questionnaire    Fear of Current or Ex-Partner: No    Emotionally Abused: No    Physically Abused: No    Sexually Abused: No    Family History  Problem Relation Age of Onset   Cancer Mother 62       leukemia   Heart disease Father    Heart attack Father 1   Lung cancer Brother    Colon cancer Neg Hx    Rectal cancer Neg Hx     Review of Systems:  As stated in the HPI and otherwise negative.   BP 102/60   Pulse 63   Ht 5' 8 (1.727 m)   Wt 96.6 kg   SpO2 98%   BMI  32.39 kg/m   Physical Examination:  General: Well developed, well nourished, NAD  HEENT: OP clear, mucus membranes moist  SKIN: warm, dry. No rashes. Neuro: No focal deficits  Musculoskeletal: Muscle strength 5/5 all ext  Psychiatric: Mood and affect normal  Neck: No JVD, no carotid bruits, no thyromegaly, no lymphadenopathy.  Lungs:Clear bilaterally, no wheezes, rhonci, crackles Cardiovascular: Regular rate and rhythm. No murmurs, gallops or rubs. Abdomen:Soft. Bowel sounds present. Non-tender.  Extremities: No lower extremity edema. Pulses are 2 + in the bilateral DP/PT.  EKG:  EKG is not ordered today. The ekg ordered today demonstrates    Recent Labs: 11/09/2023: BUN 56; Creatinine 2.0; Hemoglobin 9.9; Platelets 143; Potassium 4.8; Sodium 144   Lipid Panel    Component Value Date/Time   CHOL 101 11/12/2021 1438   CHOL 101 06/08/2016 0843   TRIG 208.0 (H) 11/12/2021 1438   HDL 37.10 (L) 11/12/2021 1438   HDL 37 (L) 06/08/2016 0843   CHOLHDL 3 11/12/2021 1438   VLDL 41.6 (H) 11/12/2021 1438   LDLCALC 38 04/13/2021 0947   LDLDIRECT 41.0 11/12/2021 1438     Wt Readings from Last 3 Encounters:  02/02/24 96.6 kg  02/02/24 96.6 kg  10/08/23 95 kg    Assessment and Plan:   1. CAD s/p CABG without angina: No chest pain suggestive of angina. Continue ASA, statin and beta blocker.        2. Atrial fibrillation, paroxysmal: Sinus today. Continue beta blocker and coumadin .   3. Ischemic cardiomyopathy/Chronic diastolic CHF: LVEF=60-65% by echo September 2022. Continue Coreg  and aldactone . Continue Torsemide  20 mg daily. .    4. HLD: No recent LDL. Continue statin. Check lipid profile today  5. HTN: BP is well controlled. Continue current therapy.   Labs/ tests ordered today include:   Orders Placed This Encounter  Procedures   Lipid Profile   Disposition:   F/U with me in 12 months  Signed, Lonni Cash, MD 02/02/2024 3:46 PM    [facility] [address] [place], [place], [place]  [address] Phone: [phone]; Fax: [phone]

## 2024-02-02 NOTE — Patient Instructions (Addendum)
 Take glipizide  5 mg, 1 tablet before breakfast 1 tablet before supper   While on prednisone, it is expected that his glucose will increase.  Please increase glipizide  to 10 mg before breakfast and 10 mg before supper and let our office know so we can update his prescription  HOW TO TREAT LOW BLOOD SUGARS (Blood sugar LESS THAN 70 MG/DL) Please follow the RULE OF 15 for the treatment of hypoglycemia treatment (when your (blood sugars are less than 70 mg/dL)   STEP 1: Take 15 grams of carbohydrates when your blood sugar is low, which includes:  3-4 GLUCOSE TABS  OR 3-4 OZ OF JUICE OR REGULAR SODA OR ONE TUBE OF GLUCOSE GEL    STEP 2: RECHECK blood sugar in 15 MINUTES STEP 3: If your blood sugar is still low at the 15 minute recheck --> then, go back to STEP 1 and treat AGAIN with another 15 grams of carbohydrates.

## 2024-02-02 NOTE — Progress Notes (Signed)
 Name: SINAI MAHANY  Age/ Sex: 83 y.o., male   MRN/ DOB: [id number], 30-Apr-1941     PCP: Oris Camie BRAVO, NP   Reason for Endocrinology Evaluation: Type 2 Diabetes Mellitus  Initial Endocrine Consultative Visit: 10/09/2019    PATIENT IDENTIFIER: Mr. BANKS CHAIKIN is a 83 y.o. male with a past medical history of T2DM, HTN, PVD, CAD, A.Fib and Prostate Ca ( S/P chemo and radiation). The patient has followed with Endocrinology clinic since 10/09/2019 for consultative assistance with management of his diabetes.      DIABETIC HISTORY:  Mr. Guaman was diagnosed with T2DM many years ago.  He was prescribed Jardiance  in the past, but unable to use due to cost.  hemoglobin A1c has ranged from 6.4% in 2015, peaking at 11.7% in 2020 On his initial visit to our clinic his A1c was 9.7%, he was on glipizide , Toujeo , and metformin .  We switched his Toujeo  to Humalog  mix, we stopped glipizide  and continued Metformin .  Lives with wife    Wife discontinued insulin  mix 01/2022 due to hypoglycemia, he was started on glipizide  and Farxiga  as well as continuing metformin   Metformin  discontinued 06/2022 due to low GFR  I attempted to switch glipizide  to repaglinide  but the patient developed multiple arthralgias that he attributed to repaglinide   Farxiga  was held 08/2023   SUBJECTIVE:   During the last visit (09/13/2023): A1c 7.4%.      Today (02/02/2024): Mr. Haag is here for a follow-up on diabetes management. Accompanied by wife  . He checks his blood sugars multiple times a day through the CGM. The patient has not had hypoglycemic episodes since the last clinic visit.    He continues to follow-up with cardiology for PVD, ischemic cardiomyopathy and HTN Patient presented to the ED 10/08/2023 with urinary retention  Patient follows with [facility] oncology/hematology for prostate cancer recurrence, with T5 vertebral body metastasis, he is on degarelix injections  Denies nausea or vomiting Denies  constipation or diarrhea   He is on Farxiga  through Cardiology  He will start prednisone daily for prostate cancer treatment  HOME DIABETES REGIMEN:  Glipizide  5 mg, daily     Statin: Yes ACE-I/ARB: Yes     CONTINUOUS GLUCOSE MONITORING RECORD INTERPRETATION   N/a      DIABETIC COMPLICATIONS: Microvascular complications:  Neuropathy , CKD III, S/P bilateral partial amputations (great toes) Denies:retinopathy  Last eye exam: Completed 12/2019   Macrovascular complications:  CAD, PVD Denies: CVA   HISTORY:  Past Medical History:  Past Medical History:  Diagnosis Date   Acidosis 07/19/2021   Acute on chronic diastolic (congestive) heart failure (HCC) 09/30/2014   AKI (acute kidney injury) (HCC) 12/15/2022   Atrial fibrillation (HCC)    a. coumadin ;  b. Amiodarone    CAD (coronary artery disease)    a.  Lexiscan  Myoview (02/14/14):  Apical, inferolateral scar; ? Mild ischemia; EF 32%.;  b.  LHC (02/21/14):  dLM 100%, LAD 100%, CFX 100%, dRCA 50%. 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 Echo (02/17/14):  EF 35-40%, anteroseptal and apical AK, Gr 2 DD, MAC, mod LAE.   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   Peripheral vascular disease, unspecified (HCC)    Personal history of nicotine dependence 07/19/2021   Prostate cancer (HCC)    Proteinuria 09/30/2009   Thrombocytopenia, unspecified (HCC)    Tobacco use disorder    Tobacco user 11/11/2009    Type 2 diabetes mellitus with diabetic peripheral angiopathy without gangrene (HCC) 07/19/2021   Type II or unspecified type diabetes mellitus with renal manifestations, uncontrolled(250.42)    Urolithiasis 12/26/1997   Past Surgical History:  Past Surgical History:  Procedure Laterality Date   AMPUTATION TOE Bilateral 12/17/2022   Procedure: TOE AMPUTATION OF LEFT 1ST AND 2ND PARTIAL, AND RIGHT 1ST PARTIAL;  Surgeon: Silva Juliene SAUNDERS, DPM;  Location: [facility];  Service: Podiatry;  Laterality: Bilateral;   BACK SURGERY     CATARACT EXTRACTION  2010   CLIPPING OF ATRIAL APPENDAGE Left 02/24/2014   Procedure: CLIPPING OF ATRIAL APPENDAGE;  Surgeon: Dorise MARLA Fellers, MD;  Location: [facility] OR;  Service: Open Heart Surgery;  Laterality: Left;   COLONOSCOPY  2005   Normal   CORONARY ARTERY BYPASS GRAFT N/A 02/24/2014   Procedure: Coronary artery bypass graft times three using left internal mammary artery and left leg greater saphenous vein harvested endoscopically.;  Surgeon: Dorise MARLA Fellers, MD;  Location: [facility] OR;  Service: Open Heart Surgery;  Laterality: N/A;   INTRAOPERATIVE TRANSESOPHAGEAL ECHOCARDIOGRAM N/A 02/24/2014   Procedure: INTRAOPERATIVE TRANSESOPHAGEAL ECHOCARDIOGRAM;  Surgeon: Dorise MARLA Fellers, MD;  Location: [facility] OR;  Service: Open Heart Surgery;  Laterality: N/A;   LEFT HEART CATHETERIZATION WITH CORONARY ANGIOGRAM N/A 02/21/2014   Procedure: LEFT HEART CATHETERIZATION WITH CORONARY ANGIOGRAM;  Surgeon: Peter M Jordan, MD;  Location: [facility] CATH LAB;  Service: Cardiovascular;  Laterality: N/A;   Social History:  reports that he has never smoked. He quit smokeless tobacco use about 9 years ago.  His smokeless tobacco use included chew. He reports that he does not drink alcohol and does not use drugs. Family History:  Family History  Problem Relation Age of Onset   Cancer Mother 23       leukemia   Heart disease Father    Heart attack Father 63   Lung cancer Brother    Colon cancer Neg Hx    Rectal  cancer Neg Hx      HOME MEDICATIONS: Allergies as of 02/02/2024       Reactions   Niacin         Medication List        Accurate as of February 02, 2024  1:44 PM. If you have any questions, ask your nurse or doctor.          STOP taking these medications    Shingrix injection Generic drug: Zoster Vaccine Adjuvanted Stopped by: Donell PARAS Lissette Schenk       TAKE these medications    aspirin  EC 81 MG tablet Take 81 mg by mouth daily. Swallow whole.   atorvastatin  40 MG tablet Commonly known as: LIPITOR Take 1 tablet (40 mg total) by mouth daily.   Calcium  Carbonate-Vitamin D  600-5 MG-MCG Caps 1 cap, PO (by Mouth), Daily, 0 Refill(s)   carvedilol  12.5 MG tablet Commonly known as: COREG  Take 1 tablet (12.5 mg total) by mouth 2 (two) times daily with a meal.   citalopram  10 MG tablet  Commonly known as: CELEXA  Take 1 tablet (10 mg total) by mouth daily.   Dexcom G6 Sensor Misc 1 Device by Does not apply route as directed.   Dexcom G6 Transmitter Misc 1 Device by Does not apply route as directed.   ferrous sulfate 325 (65 FE) MG tablet Take by mouth.   FLAXSEED OIL PO Take 1 tablet by mouth daily.   glipiZIDE  5 MG tablet Commonly known as: GLUCOTROL  Take 1 tablet (5 mg total) by mouth daily before breakfast.   MULTIVITAMINS PO Take 1 tablet by mouth daily.   predniSONE 5 MG tablet Commonly known as: DELTASONE Take 1 tablet by mouth daily.   spironolactone  25 MG tablet Commonly known as: ALDACTONE  Take 1/2 (one-half) tablet by mouth once daily   tamsulosin 0.4 MG Caps capsule Commonly known as: FLOMAX Take by mouth.   torsemide  20 MG tablet Commonly known as: DEMADEX  3 tab, PO (by Mouth), Daily, fluid pill, This medication replaces your previous prescribed Lasix .  Please take this medication until directed otherwise by your nephrologist., # 90 tab, 0 Refill(s), Pharmacy: [id number], 177.8, cm, 07/16/21...   torsemide  20 MG  tablet Commonly known as: DEMADEX  Take 1 tablet by mouth once daily   Vitamin C  500 MG Caps Take 1 capsule by mouth daily. What changed: how much to take   vitamin E 200 UNIT capsule Take 200 Units by mouth daily.   warfarin 5 MG tablet Commonly known as: COUMADIN  Take as directed by the anticoagulation clinic. If you are unsure how to take this medication, talk to your nurse or doctor. Original instructions: TAKE 1 TABLET TO 2 TABLETS BY MOUTH ONCE DAILY AS  DIRECTED  BY  ANTICOAGULATION  CLINIC         OBJECTIVE:   Vital Signs: BP (!) 140/82 (BP Location: Left Arm, Patient Position: Sitting, Cuff Size: Normal)   Pulse 76   Ht 5' 8 (1.727 m)   Wt 213 lb (96.6 kg)   SpO2 99%   BMI 32.39 kg/m   Wt Readings from Last 3 Encounters:  02/02/24 213 lb (96.6 kg)  10/08/23 209 lb 7 oz (95 kg)  10/02/23 210 lb 6.4 oz (95.4 kg)     Exam: General: Pt appears well and is in NAD  Lungs: Clear with good BS bilat   Heart: RRR   Extremities:  No  pretibial edema..  Neuro: MS is good with appropriate affect, pt is alert and Ox3       DM foot exam: 05/03/2023 per podiatry    DATA REVIEWED:  Lab Results  Component Value Date   HGBA1C 7.4 (A) 09/13/2023   HGBA1C 6.4 (A) 02/21/2023   HGBA1C 7.8 (H) 01/03/2023    Latest Reference Range & Units 11/09/23 00:00 11/10/23 00:00  COMPREHENSIVE METABOLIC PANEL  Rpt (E)   Sodium 137 - 147  144 (E)   Potassium 3.5 - 5.1 mEq/L 4.8 (E)   Chloride 99 - 108  105 (E)   CO2 13 - 22  24 ! (E)   Glucose  162 (E)   BUN 4 - 21  56 ! (E)   Creatinine 0.6 - 1.3  2.0 ! (E)   Calcium  8.7 - 10.7  9.4 (E)   eGFR  33 (E) 33.0 (E)  Albumin  3.5 - 5.0  3.5 (E)      ASSESSMENT / PLAN / RECOMMENDATIONS:   1) Type 2 Diabetes Mellitus, Sub-Optimally  controlled, With neuropathic, CKD III and macrovascular complications -  Most recent A1c of 7.6%. Goal A1c <7.0%.  -Patient has been noted with worsening glycemic control, -Unable to download Dexcom  today but per spouse, patient has been waking up with high glucose readings -Patient could not tolerate repaglinide  due to multiple arthralgias -Cardiology has recommended continuing with Farxiga  -Patient will start prednisone as part of prostate cancer treatment, we discussed hyperglycemia, we will attempt to treat this with glipizide , but will have low threshold for starting insulin  if needed -If hypoglycemia persist on prednisone, increase glipizide  to 10 mg before breakfast and 10 mg before supper -A prescription for Dexcom G7 will be faxed to her DME supplier MEDICATIONS:  Increase glipizide  5 mg, 1 tablet before breakfast 1 tablet before supper   EDUCATION / INSTRUCTIONS: BG monitoring instructions: Patient is instructed to check his blood sugars 3 times a day. Call [facility] if: BG persistently < 70  I reviewed the Rule of 15 for the treatment of hypoglycemia in detail with the patient. Literature supplied.   2)Diabetic complications:  Eye: Does not have known diabetic retinopathy.  Neuro/ Feet: Does have known diabetic peripheral neuropathy. Renal: Patient does have known baseline CKD. He is on an ACEI/ARB at present.  F/U in 4 months   I spent 25 minutes preparing to see the patient by review of recent labs, imaging and procedures, obtaining and reviewing separately obtained history, communicating with the patient/family or caregiver, ordering medications, tests or procedures, and documenting clinical information in the EHR including the differential Dx, treatment, and any further evaluation and other management    Signed electronically by: Stefano Redgie Butts, MD  [facility] End[facility]ology  [facility] [address] [place] [place], [place] [address] Phone: [phone] FAX: [phone]   CC: Oris Camie BRAVO, NP [address] [place] [address] Phone: [phone]  Fax: [phone]  Return to Endocrinology clinic as  below: Future Appointments  Date Time Provider Department Center  02/02/2024  3:00 PM Verlin Lonni BIRCH, MD [facility]  02/22/2024  1:00 PM [facility] None

## 2024-02-02 NOTE — Patient Instructions (Signed)
Medication Instructions:  No changes *If you need a refill on your cardiac medications before your next appointment, please call your pharmacy*   Lab Work: Today: lipids  If you have labs (blood work) drawn today and your tests are completely normal, you will receive your results only by: [place] (if you have MyChart) OR A paper copy in the mail If you have any lab test that is abnormal or we need to change your treatment, we will call you to review the results.   Testing/Procedures: none   Follow-Up: At [facility], you and your health needs are our priority.  As part of our continuing mission to provide you with exceptional heart care, we have created designated Provider Care Teams.  These Care Teams include your primary Cardiologist (physician) and Advanced Practice Providers (APPs -  Physician Assistants and Nurse Practitioners) who all work together to provide you with the care you need, when you need it.   Your next appointment:   12 month(s)  Provider:   Lauree Chandler, MD

## 2024-02-03 LAB — LIPID PANEL
Chol/HDL Ratio: 3 {ratio} (ref 0.0–5.0)
Cholesterol, Total: 105 mg/dL (ref 100–199)
HDL: 35 mg/dL — ABNORMAL LOW (ref >39)
LDL Chol Calc (NIH): 48 mg/dL (ref 0–99)
Triglycerides: 121 mg/dL (ref 0–149)
VLDL Cholesterol Cal: 22 mg/dL (ref 5–40)

## 2024-02-22 ENCOUNTER — Other Ambulatory Visit: Payer: Self-pay | Admitting: Nurse Practitioner

## 2024-02-22 ENCOUNTER — Ambulatory Visit: Payer: Medicare Other | Attending: Cardiology

## 2024-02-22 DIAGNOSIS — I4891 Unspecified atrial fibrillation: Secondary | ICD-10-CM | POA: Insufficient documentation

## 2024-02-22 DIAGNOSIS — R4586 Emotional lability: Secondary | ICD-10-CM

## 2024-02-22 DIAGNOSIS — Z5181 Encounter for therapeutic drug level monitoring: Secondary | ICD-10-CM | POA: Insufficient documentation

## 2024-02-22 LAB — POCT INR: INR: 2.4 (ref 2.0–3.0)

## 2024-02-22 NOTE — Patient Instructions (Signed)
 Description   Continue taking warfarin 1 tablet daily EXCEPT 2 tablet on Sundays and Wednesdays.   Stay consistent with greens each week (2-3 times per week).  Recheck INR in 4 weeks. Coumadin Clinic [phone].

## 2024-02-22 NOTE — Telephone Encounter (Signed)
 Last apt 04/04/23 pt. Sent message to schedule an appt. For this year does not have one yet.

## 2024-03-15 ENCOUNTER — Other Ambulatory Visit: Payer: Self-pay | Admitting: Cardiovascular Disease

## 2024-03-15 DIAGNOSIS — I4891 Unspecified atrial fibrillation: Secondary | ICD-10-CM

## 2024-03-15 NOTE — Telephone Encounter (Signed)
 Prescription refill request received for warfarin Lov: 02/02/24 Seth [name])  Next INR check: 03/21/24 Warfarin tablet strength: 5mg   Appropriate dose. Refill sent.

## 2024-03-16 ENCOUNTER — Emergency Department (HOSPITAL_BASED_OUTPATIENT_CLINIC_OR_DEPARTMENT_OTHER)

## 2024-03-16 ENCOUNTER — Emergency Department (HOSPITAL_BASED_OUTPATIENT_CLINIC_OR_DEPARTMENT_OTHER)
Admission: EM | Admit: 2024-03-16 | Discharge: 2024-03-16 | Disposition: A | Discharge status: Home / Self Care | Attending: Emergency Medicine | Admitting: Emergency Medicine

## 2024-03-16 ENCOUNTER — Encounter (HOSPITAL_BASED_OUTPATIENT_CLINIC_OR_DEPARTMENT_OTHER): Payer: Self-pay

## 2024-03-16 DIAGNOSIS — S51821A Laceration with foreign body of right forearm, initial encounter: Secondary | ICD-10-CM | POA: Diagnosis present

## 2024-03-16 DIAGNOSIS — S0083XA Contusion of other part of head, initial encounter: Secondary | ICD-10-CM | POA: Diagnosis not present

## 2024-03-16 DIAGNOSIS — S81002A Unspecified open wound, left knee, initial encounter: Secondary | ICD-10-CM | POA: Insufficient documentation

## 2024-03-16 DIAGNOSIS — S61411A Laceration without foreign body of right hand, initial encounter: Secondary | ICD-10-CM | POA: Insufficient documentation

## 2024-03-16 DIAGNOSIS — W0110XA Fall on same level from slipping, tripping and stumbling with subsequent striking against unspecified object, initial encounter: Secondary | ICD-10-CM | POA: Diagnosis not present

## 2024-03-16 DIAGNOSIS — S51811A Laceration without foreign body of right forearm, initial encounter: Secondary | ICD-10-CM

## 2024-03-16 DIAGNOSIS — T07XXXA Unspecified multiple injuries, initial encounter: Secondary | ICD-10-CM

## 2024-03-16 DIAGNOSIS — D689 Coagulation defect, unspecified: Secondary | ICD-10-CM | POA: Diagnosis not present

## 2024-03-16 DIAGNOSIS — Z7901 Long term (current) use of anticoagulants: Secondary | ICD-10-CM | POA: Diagnosis not present

## 2024-03-16 DIAGNOSIS — Z7982 Long term (current) use of aspirin: Secondary | ICD-10-CM | POA: Insufficient documentation

## 2024-03-16 DIAGNOSIS — W19XXXA Unspecified fall, initial encounter: Secondary | ICD-10-CM

## 2024-03-16 LAB — COMPREHENSIVE METABOLIC PANEL
ALT: 46 U/L — ABNORMAL HIGH (ref 0–44)
AST: 37 U/L (ref 15–41)
Albumin: 3.3 g/dL — ABNORMAL LOW (ref 3.5–5.0)
Alkaline Phosphatase: 75 U/L (ref 38–126)
Anion gap: 9 (ref 5–15)
BUN: 45 mg/dL — ABNORMAL HIGH (ref 8–23)
CO2: 23 mmol/L (ref 22–32)
Calcium: 8.8 mg/dL — ABNORMAL LOW (ref 8.9–10.3)
Chloride: 107 mmol/L (ref 98–111)
Creatinine, Ser: 1.66 mg/dL — ABNORMAL HIGH (ref 0.61–1.24)
GFR, Estimated: 41 mL/min — ABNORMAL LOW (ref >60)
Glucose, Bld: 192 mg/dL — ABNORMAL HIGH (ref 70–99)
Potassium: 3.2 mmol/L — ABNORMAL LOW (ref 3.5–5.1)
Sodium: 139 mmol/L (ref 135–145)
Total Bilirubin: 0.5 mg/dL (ref 0.0–1.2)
Total Protein: 7.1 g/dL (ref 6.5–8.1)

## 2024-03-16 LAB — CBC WITH DIFFERENTIAL/PLATELET
Abs Immature Granulocytes: 0.03 10*3/uL (ref 0.00–0.07)
Basophils Absolute: 0 10*3/uL (ref 0.0–0.1)
Basophils Relative: 1 %
Eosinophils Absolute: 0.1 10*3/uL (ref 0.0–0.5)
Eosinophils Relative: 2 %
HCT: 36.9 % — ABNORMAL LOW (ref 39.0–52.0)
Hemoglobin: 12.1 g/dL — ABNORMAL LOW (ref 13.0–17.0)
Immature Granulocytes: 0 %
Lymphocytes Relative: 18 %
Lymphs Abs: 1.5 10*3/uL (ref 0.7–4.0)
MCH: 29 pg (ref 26.0–34.0)
MCHC: 32.8 g/dL (ref 30.0–36.0)
MCV: 88.5 fL (ref 80.0–100.0)
Monocytes Absolute: 0.8 10*3/uL (ref 0.1–1.0)
Monocytes Relative: 10 %
Neutro Abs: 6 10*3/uL (ref 1.7–7.7)
Neutrophils Relative %: 69 %
Platelets: 131 10*3/uL — ABNORMAL LOW (ref 150–400)
RBC: 4.17 MIL/uL — ABNORMAL LOW (ref 4.22–5.81)
RDW: 14.7 % (ref 11.5–15.5)
WBC: 8.5 10*3/uL (ref 4.0–10.5)
nRBC: 0 % (ref 0.0–0.2)

## 2024-03-16 LAB — PROTIME-INR
INR: 1.7 — ABNORMAL HIGH (ref 0.8–1.2)
Prothrombin Time: 19.9 s — ABNORMAL HIGH (ref 11.4–15.2)

## 2024-03-16 MED ORDER — HYDRALAZINE HCL 20 MG/ML IJ SOLN
5.0000 mg | Freq: Once | INTRAMUSCULAR | Status: AC
  Administered 2024-03-16: 5 mg via INTRAVENOUS
  Filled 2024-03-16: qty 1

## 2024-03-16 MED ORDER — CEPHALEXIN 250 MG PO CAPS
500.0000 mg | ORAL_CAPSULE | Freq: Once | ORAL | Status: AC
  Administered 2024-03-16: 500 mg via ORAL
  Filled 2024-03-16: qty 2

## 2024-03-16 MED ORDER — LIDOCAINE-EPINEPHRINE (PF) 2 %-1:200000 IJ SOLN
20.0000 mL | Freq: Once | INTRAMUSCULAR | Status: AC
  Administered 2024-03-16: 20 mL
  Filled 2024-03-16: qty 20

## 2024-03-16 MED ORDER — CEPHALEXIN 500 MG PO CAPS: 500.0000 mg | ORAL_CAPSULE | Freq: Three times a day (TID) | ORAL | 0 refills | Status: DC

## 2024-03-16 NOTE — ED Notes (Signed)
 Messaged EDP regarding pt BP; will continue to monitor and await orders.

## 2024-03-16 NOTE — ED Notes (Signed)
Lac repair in progress at bedside.

## 2024-03-16 NOTE — ED Triage Notes (Addendum)
 Pt brought in by wife. Pt reports leaning over to pick up something off deck and tumbled forward. Denies LOC, but is on coumadin. Abrasion to forehead, left knee, and laceration to right forearm. Pressure wrapped by wife. Bleeding controlled at this time

## 2024-03-16 NOTE — Discharge Instructions (Addendum)
 Sutures will need to be removed in 10 days by your primary care provider. It is recommended that you see your PCP in 3-4 days for wound recheck of multiple wounds.   Take Keflex 3 times daily for the next 7 days to prevent infection given the depth of the forearm wound. Take Tylenol as needed for pain.   Please use your assistive devices - cane, walker, etc - when ambulating for safe mobility

## 2024-03-16 NOTE — ED Provider Notes (Signed)
 [name] Provider Note   CSN: [id number] Arrival date & time: 03/16/24  1613     History  Chief Complaint  Patient presents with   Fall   Laceration    Seth [name] is a 83 y.o. male.  Patient to ED after fall today after losing his balance. 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 No chest pain, pre-fall dizziness. He hit his head, suffered a wound to the right arm and left knee, no LOC, no nausea/vomiting. He denies chest pain, abdominal pain. He has been ambulatory since the fall. He is anticoagulated on Coumadin for atrial fibrillation. Tetanus is UTD.  The history is provided by the patient. No language interpreter was used.  Fall  Laceration      Home Medications Prior to Admission medications   Medication Sig Start Date End Date Taking? Authorizing Provider  cephALEXin (KEFLEX) 500 MG capsule Take 1 capsule (500 mg total) by mouth 3 (three) times daily. 03/16/24  Yes Uziel Covault, Melvenia Beam, PA-C  Ascorbic Acid (VITAMIN C) 500 MG CAPS Take 1 capsule by mouth daily. Patient taking differently: Take 2 capsules by mouth daily. 12/18/22   Amin, Ankit C, MD  aspirin EC 81 MG tablet Take 81 mg by mouth daily. Swallow whole.    [provider]  atorvastatin (LIPITOR) 40 MG tablet Take 1 tablet (40 mg total) by mouth daily. 02/02/24   Kathleene Hazel, MD  Calcium Carbonate-Vitamin D 600-5 MG-MCG CAPS 1 cap, PO (by Mouth), Daily, 0 Refill(s) 07/16/21   [provider]  carvedilol (COREG) 12.5 MG tablet Take 1 tablet (12.5 mg total) by mouth 2 (two) times daily with a meal. 02/02/24   Kathleene Hazel, MD  citalopram (CELEXA) 10 MG tablet Take 1 tablet by mouth once daily 02/22/24   Early, Sung Amabile, NP  Continuous Glucose Sensor (DEXCOM G7 SENSOR) MISC 1 Device by Does not apply route as directed. 02/02/24   Shamleffer, Konrad Dolores, MD   ferrous sulfate 325 (65 FE) MG tablet Take by mouth.    [provider]  Flaxseed, Linseed, (FLAXSEED OIL PO) Take 1 tablet by mouth daily.    [provider]  glipiZIDE (GLUCOTROL) 5 MG tablet Take 1 tablet (5 mg total) by mouth 2 (two) times daily before a meal. 02/02/24   Shamleffer, Konrad Dolores, MD  Multiple Vitamin (MULTIVITAMINS PO) Take 1 tablet by mouth daily.    [provider]  predniSONE (DELTASONE) 5 MG tablet Take 1 tablet by mouth daily. 12/28/23   [provider]  spironolactone (ALDACTONE) 25 MG tablet Take 1/2 (one-half) tablet by mouth once daily 01/17/24   Tysinger, Kermit Balo, PA-C  tamsulosin (FLOMAX) 0.4 MG CAPS capsule Take by mouth.    [provider]  torsemide (DEMADEX) 20 MG tablet Take 1 tablet by mouth once daily 11/06/23   Kathleene Hazel, MD  vitamin E 200 UNIT capsule Take 200 Units by mouth daily.    [provider]  warfarin (COUMADIN) 5 MG tablet TAKE 1 TO 2 TABLETS BY MOUTH ONCE DAILY AS  DIRECTED  BY  ANTICOAGULATION  CLINIC 03/15/24   Kathleene Hazel, MD      Allergies    Niacin    Review of Systems   Review of Systems  Physical Exam Updated Vital Signs BP 110/81   Pulse 64   Temp 98.2 F (  36.8 C) (Oral)   Resp 18   Wt 95.3 kg   SpO2 100%   BMI 31.93 kg/m  Physical Exam Vitals and nursing note reviewed.  Constitutional:      Appearance: He is well-developed.  HENT:     Head: Normocephalic.     Comments: Large hematoma to right upper forehead with deep abrasion.     Nose: Nose normal.  Eyes:     Pupils: Pupils are equal, round, and reactive to light.  Neck:     Comments: No midline cervical tenderness.  Cardiovascular:     Rate and Rhythm: Normal rate.     Heart sounds: No murmur heard. Pulmonary:     Effort: Pulmonary effort is normal.  Chest:     Chest wall: No tenderness.  Abdominal:     Palpations: Abdomen is soft.     Tenderness: There is no abdominal  tenderness. There is no guarding or rebound.  Musculoskeletal:        General: Normal range of motion.     Cervical back: Normal range of motion and neck supple.     Comments: FROM all extremities. Specifically, right forearm with normal flexion/extension at wrist without strength loss. Full strength in all digits with passive and active ROM. Left knee normal flex/ext.   Skin:    General: Skin is warm and dry.     Comments: 10 cm full thickness laceration dorsal right forearm with ruptured extensor tendon without functional deficit; skin tear anterior left knee; deep abrasion to forehead; skin tear to right dorsal hand.   Neurological:     General: No focal deficit present.     Mental Status: He is alert and oriented to person, place, and time.     GCS: GCS eye subscore is 4. GCS verbal subscore is 5. GCS motor subscore is 6.     Cranial Nerves: Cranial nerves 2-12 are intact.     Sensory: Sensation is intact.     Motor: Motor function is intact.     Coordination: Coordination is intact.     ED Results / Procedures / Treatments   Labs (all labs ordered are listed, but only abnormal results are displayed) Labs Reviewed  PROTIME-INR - Abnormal; Notable for the following components:      Result Value   Prothrombin Time 19.9 (*)    INR 1.7 (*)    All other components within normal limits  CBC WITH DIFFERENTIAL/PLATELET - Abnormal; Notable for the following components:   RBC 4.17 (*)    Hemoglobin 12.1 (*)    HCT 36.9 (*)    Platelets 131 (*)    All other components within normal limits  COMPREHENSIVE METABOLIC PANEL - Abnormal; Notable for the following components:   Potassium 3.2 (*)    Glucose, Bld 192 (*)    BUN 45 (*)    Creatinine, Ser 1.66 (*)    Calcium 8.8 (*)    Albumin 3.3 (*)    ALT 46 (*)    GFR, Estimated 41 (*)    All other components within normal limits   Results for orders placed or performed during the hospital encounter of 03/16/24  Protime-INR    Collection Time: 03/16/24  6:14 PM  Result Value Ref Range   Prothrombin Time 19.9 (H) 11.4 - 15.2 seconds   INR 1.7 (H) 0.8 - 1.2  CBC with Differential   Collection Time: 03/16/24  6:14 PM  Result Value Ref Range   WBC 8.5 4.0 -  10.5 K/uL   RBC 4.17 (L) 4.22 - 5.81 MIL/uL   Hemoglobin 12.1 (L) 13.0 - 17.0 g/dL   HCT 16.1 (L) 09.6 - 04.5 %   MCV 88.5 80.0 - 100.0 fL   MCH 29.0 26.0 - 34.0 pg   MCHC 32.8 30.0 - 36.0 g/dL   RDW 40.9 81.1 - 91.4 %   Platelets 131 (L) 150 - 400 K/uL   nRBC 0.0 0.0 - 0.2 %   Neutrophils Relative % 69 %   Neutro Abs 6.0 1.7 - 7.7 K/uL   Lymphocytes Relative 18 %   Lymphs Abs 1.5 0.7 - 4.0 K/uL   Monocytes Relative 10 %   Monocytes Absolute 0.8 0.1 - 1.0 K/uL   Eosinophils Relative 2 %   Eosinophils Absolute 0.1 0.0 - 0.5 K/uL   Basophils Relative 1 %   Basophils Absolute 0.0 0.0 - 0.1 K/uL   Immature Granulocytes 0 %   Abs Immature Granulocytes 0.03 0.00 - 0.07 K/uL  Comprehensive metabolic panel   Collection Time: 03/16/24  6:14 PM  Result Value Ref Range   Sodium 139 135 - 145 mmol/L   Potassium 3.2 (L) 3.5 - 5.1 mmol/L   Chloride 107 98 - 111 mmol/L   CO2 23 22 - 32 mmol/L   Glucose, Bld 192 (H) 70 - 99 mg/dL   BUN 45 (H) 8 - 23 mg/dL   Creatinine, Ser 7.82 (H) 0.61 - 1.24 mg/dL   Calcium 8.8 (L) 8.9 - 10.3 mg/dL   Total Protein 7.1 6.5 - 8.1 g/dL   Albumin 3.3 (L) 3.5 - 5.0 g/dL   AST 37 15 - 41 U/L   ALT 46 (H) 0 - 44 U/L   Alkaline Phosphatase 75 38 - 126 U/L   Total Bilirubin 0.5 0.0 - 1.2 mg/dL   GFR, Estimated 41 (L) >60 mL/min   Anion gap 9 5 - 15    EKG None  Radiology CT Cervical Spine Wo Contrast Result Date: 03/16/2024 CLINICAL DATA:  Recent fall with neck pain, initial encounter EXAM: CT CERVICAL SPINE WITHOUT CONTRAST TECHNIQUE: Multidetector CT imaging of the cervical spine was performed without intravenous contrast. Multiplanar CT image reconstructions were also generated. RADIATION DOSE REDUCTION: This exam was  performed according to the departmental dose-optimization program which includes automated exposure control, adjustment of the mA and/or kV according to patient size and/or use of iterative reconstruction technique. COMPARISON:  None Available. FINDINGS: Alignment: Within normal limits. Skull base and vertebrae: 7 cervical segments are well visualized. Large anterior osteophytes are noted from C3 to C6. No acute fracture or acute facet abnormality is noted. Facet hypertrophic changes and osteophytic changes are seen. The odontoid is within normal limits. Soft tissues and spinal canal: Surrounding soft tissue structures are within normal limits. Upper chest: Visualized lung apices are unremarkable. Other: None IMPRESSION: Multilevel degenerative change without acute bony abnormality. Electronically Signed   By: Alcide Clever M.D.   On: 03/16/2024 19:51   CT Head Wo Contrast Result Date: 03/16/2024 CLINICAL DATA:  Head trauma, minor (Age >= 65y) Pt brought in by wife. Pt reports leaning over to pick up something off deck and tumbled forward. Denies LOC, but is on coumadin. Abrasion to forehead, left knee, and laceration to right forearm. Pressure wrapped by wife. EXAM: CT HEAD WITHOUT CONTRAST TECHNIQUE: Contiguous axial images were obtained from the base of the skull through the vertex without intravenous contrast. RADIATION DOSE REDUCTION: This exam was performed according to the departmental dose-optimization program  which includes automated exposure control, adjustment of the mA and/or kV according to patient size and/or use of iterative reconstruction technique. COMPARISON:  None Available. FINDINGS: Brain: Prominence of the lateral ventricles may be related to central predominant atrophy, although a component of normal pressure/communicating hydrocephalus cannot be excluded. Patchy and confluent areas of decreased attenuation are noted throughout the deep and periventricular white matter of the cerebral  hemispheres bilaterally, compatible with chronic microvascular ischemic disease. No evidence of large-territorial acute infarction. No parenchymal hemorrhage. No mass lesion. No extra-axial collection. No mass effect or midline shift. No hydrocephalus. Basilar cisterns are patent. Vascular: No hyperdense vessel. Atherosclerotic calcifications are present within the cavernous internal carotid and vertebral arteries. Skull: No acute fracture or focal lesion. Sinuses/Orbits: Paranasal sinuses and mastoid air cells are clear. Bilateral lens replacement. Otherwise the orbits are unremarkable. Other: 12 mm right frontal scalp hematoma. IMPRESSION: 1. No acute intracranial abnormality. 2. Prominence of the lateral ventricles may be related to central predominant atrophy, although a component of normal pressure/communicating hydrocephalus cannot be excluded. 3. A 12 mm right frontal scalp hematoma. Electronically Signed   By: Tish Frederickson M.D.   On: 03/16/2024 18:39   DG Forearm Right Result Date: 03/16/2024 CLINICAL DATA:  Fall EXAM: RIGHT FOREARM - 2 VIEW COMPARISON:  None Available. FINDINGS: There is no evidence of fracture or other focal bone lesions. Carpal and carpometacarpal joints degenerative changes. Soft tissue edema with query triangular up to 7 mm retained radiopaque foreign body. Vascular calcification. IMPRESSION: 1. No acute displaced fracture or dislocation. 2. Soft tissue edema with query triangular up to 7 mm retained radiopaque foreign body. Electronically Signed   By: Tish Frederickson M.D.   On: 03/16/2024 18:31   DG Knee Complete 4 Views Left Result Date: 03/16/2024 CLINICAL DATA:  fall, laceration EXAM: LEFT KNEE - COMPLETE 4+ VIEW COMPARISON:  None Available. FINDINGS: No evidence of fracture, dislocation, or joint effusion. At least mild tricompartmental degenerative changes of the knee. No aggressive appearing focal bone abnormality. Soft tissues are unremarkable. Vascular clips overlie  the posteromedial knee soft tissues. Vascular calcifications. No retained radiopaque foreign body. IMPRESSION: Negative. Electronically Signed   By: Tish Frederickson M.D.   On: 03/16/2024 18:29    Procedures .Laceration Repair  Date/Time: 03/16/2024 10:02 PM  Performed by: Elpidio Anis, PA-C Authorized by: Elpidio Anis, PA-C   Consent:    Consent obtained:  Verbal   Consent given by:  Patient Universal protocol:    Procedure explained and questions answered to patient or proxy's satisfaction: yes     Patient identity confirmed:  Verbally with patient Anesthesia:    Anesthesia method:  Local infiltration   Local anesthetic:  Lidocaine 2% WITH epi Laceration details:    Location:  Shoulder/arm   Shoulder/arm location:  R lower arm   Length (cm):  10 Pre-procedure details:    Preparation:  Patient was prepped and draped in usual sterile fashion and imaging obtained to evaluate for foreign bodies Exploration:    Hemostasis achieved with:  Epinephrine   Imaging outcome: foreign body noted     Wound exploration: wound explored through full range of motion and entire depth of wound visualized     Wound extent: fascia violated, foreign bodies/material and tendon damage     Tendon damage location:  Upper extremity   Upper extremity tendon damage location:  Forearm extensor   Tendon damage extent:  Complete transection   Contaminated: yes   Treatment:    Area cleansed  with:  Saline   Amount of cleaning:  Extensive   Irrigation solution:  Sterile saline   Visualized foreign bodies/material removed: yes     Debridement:  Minimal   Undermining:  None   Layers/structures repaired:  Muscle fascia and deep subcutaneous Muscle fascia:    Suture size:  5-0   Suture material:  Vicryl   Suture technique:  Simple interrupted   Number of sutures:  2 Deep subcutaneous:    Suture size:  5-0   Suture material:  Vicryl   Suture technique:  Horizontal mattress   Number of sutures:  4 Skin  repair:    Repair method:  Sutures   Suture size:  5-0 and 3-0   Suture material:  Prolene   Suture technique:  Simple interrupted and vertical mattress   Number of sutures:  16 Approximation:    Approximation:  Close Repair type:    Repair type:  Intermediate Post-procedure details:    Dressing:  Antibiotic ointment and non-adherent dressing   Procedure completion:  Tolerated well, no immediate complications     Medications Ordered in ED Medications  hydrALAZINE (APRESOLINE) injection 5 mg (5 mg Intravenous Given 03/16/24 1916)  lidocaine-EPINEPHrine (XYLOCAINE W/EPI) 2 %-1:200000 (PF) injection 20 mL (20 mLs Infiltration Given by Other 03/16/24 2012)  cephALEXin (KEFLEX) capsule 500 mg (500 mg Oral Given 03/16/24 2156)    ED Course/ Medical Decision Making/ A&P                                 Medical Decision Making This patient presents to the ED for concern of fall, this involves an extensive number of treatment options, and is a complaint that carries with it a high risk of complications and morbidity.  The differential diagnosis includes fractures, internal injury, internal bleeding, vascular injury   Co morbidities that complicate the patient evaluation  Coagulopathy, on Coumadin for atrial fib   Additional history obtained:  Additional history and/or information obtained from chart review, notable for n/a   Lab Tests:  I Ordered, and personally interpreted labs.  The pertinent results include:  WBC 8.5, hgb 12.1; Cr 1.66 (c/w previous), K+ 3.2; INR 1.7    Imaging Studies ordered:  I ordered imaging studies including  Head and neck CT - negative for acute injury Forearm, right - negative for fx; 7 mm FB visualized Left knee - negative for fracture Per radiologist interpretation   Cardiac Monitoring:  The patient was maintained on a cardiac monitor.  I personally viewed and interpreted the cardiac monitored which showed an underlying rhythm of:  n/a   Medicines ordered and prescription drug management:  I ordered medication including n/a  for n/a Reevaluation of the patient after these medicines showed that the patient Patient declined need for pain mgmt. 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  Right forearm wound repaired in multi-level fashion per procedure  note Blood pressure elevated, highest 223/77 - 5 mg Hydralazine IV provided with good result. Final check 110/81   Reevaluation:  After the interventions noted above, I reevaluated the patient and found that they have :improved   Social Determinants of Health:  Lives at home with spouse   Disposition:  After consideration of the diagnostic results and the patients response to treatment, I feel that the patient would benefit from discharge home.   Amount and/or Complexity of Data Reviewed Labs: ordered. Radiology: ordered.  Risk Prescription drug management.           Final Clinical Impression(s) / ED Diagnoses Final diagnoses:  Fall, initial encounter  Multiple abrasions  Laceration of right forearm, initial encounter  Coagulopathy (HCC)    Rx / DC Orders ED Discharge Orders          Ordered    cephALEXin (KEFLEX) 500 MG capsule  3 times daily        03/16/24 2150              Elpidio Anis, PA-C 03/16/24 2211    Alvira Monday, MD 03/18/24 2255

## 2024-03-16 NOTE — ED Notes (Signed)
 Notified EDP of pt BP 223/77. Will continue to monitor and await orders.

## 2024-03-16 NOTE — ED Notes (Signed)
 Suture cart at bedside

## 2024-03-19 ENCOUNTER — Encounter: Payer: Self-pay | Admitting: Nurse Practitioner

## 2024-03-19 ENCOUNTER — Ambulatory Visit (INDEPENDENT_AMBULATORY_CARE_PROVIDER_SITE_OTHER): Admitting: Nurse Practitioner

## 2024-03-19 VITALS — BP 122/72 | HR 56 | Wt 212.0 lb

## 2024-03-19 DIAGNOSIS — E876 Hypokalemia: Secondary | ICD-10-CM

## 2024-03-19 DIAGNOSIS — I2489 Other forms of acute ischemic heart disease: Secondary | ICD-10-CM | POA: Insufficient documentation

## 2024-03-19 DIAGNOSIS — S6991XA Unspecified injury of right wrist, hand and finger(s), initial encounter: Secondary | ICD-10-CM | POA: Insufficient documentation

## 2024-03-19 DIAGNOSIS — I10 Essential (primary) hypertension: Secondary | ICD-10-CM | POA: Insufficient documentation

## 2024-03-19 DIAGNOSIS — Z7901 Long term (current) use of anticoagulants: Secondary | ICD-10-CM | POA: Diagnosis not present

## 2024-03-19 DIAGNOSIS — T148XXA Other injury of unspecified body region, initial encounter: Secondary | ICD-10-CM | POA: Diagnosis not present

## 2024-03-19 DIAGNOSIS — S0993XA Unspecified injury of face, initial encounter: Secondary | ICD-10-CM

## 2024-03-19 DIAGNOSIS — H5789 Other specified disorders of eye and adnexa: Secondary | ICD-10-CM

## 2024-03-19 DIAGNOSIS — W19XXXA Unspecified fall, initial encounter: Secondary | ICD-10-CM

## 2024-03-19 DIAGNOSIS — I5020 Unspecified systolic (congestive) heart failure: Secondary | ICD-10-CM | POA: Insufficient documentation

## 2024-03-19 DIAGNOSIS — Y92009 Unspecified place in unspecified non-institutional (private) residence as the place of occurrence of the external cause: Secondary | ICD-10-CM

## 2024-03-19 MED ORDER — POTASSIUM CHLORIDE CRYS ER 20 MEQ PO TBCR: 20.0000 meq | EXTENDED_RELEASE_TABLET | Freq: Every day | ORAL | 3 refills | Status: DC

## 2024-03-19 NOTE — Assessment & Plan Note (Signed)
 Fall on Saturday 03/16/2024 with significant injury to the right forehead, right hand, and right forearm. Ecchymosis is present along the right side of the face, neck, and body and the left eye. Right forearm laceration is deep and chart review shows this extended through the tendon. 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 He may benefit from physical therapy and gait training once he has recovered more from this incident to aid in his ability to remain safe. Very close monitoring and avoidance of lifting and carrying is recommended to reduce fall risks.

## 2024-03-19 NOTE — Assessment & Plan Note (Signed)
 Large pressure dressing in place- visualized on patients phone prior to dressing change. Area appears clean with scant bleeding. No signs of infection. Area denuded. Recommend continue to keep covered and monitor for bleeding. This will likely take several weeks to heal. With his diabetes history, care needs to be taken to ensure that infection does not present. Continue with dressing changes and close monitoring.

## 2024-03-19 NOTE — Assessment & Plan Note (Signed)
 Dressing in place- visualized on patients phone prior to dressing change. Area appears clean with scant bleeding. No signs of infection. Area denuded. Recommend continue to keep covered and monitor for bleeding. This will likely take several weeks to heal. With his diabetes history, care needs to be taken to ensure that infection does not present. Continue with dressing changes and close monitoring.

## 2024-03-19 NOTE — Assessment & Plan Note (Signed)
 Right eye swollen shut with significant ecchymosis surrounding. Recommend warm and cool compresses to aid in re-absorption of blood and reduced swelling, as well as comfort.

## 2024-03-19 NOTE — Assessment & Plan Note (Signed)
 Most recent INR 1.7 (Saturday) due for next monitoring on Thursday. Currently taking coumadin regular dosage as directed. Significant ecchymosis present to the right side of the face into the neck, left eye, and scattered primarily on the right side of the body. Expect this will continue to spread over the next few days. He does not appear to have any active bleeding at this time, which is reassuring. Given his history, we will allow him to continue on Coumadin but monitor very closely for bleeding. Recommend continue with INR on Thursday.

## 2024-03-19 NOTE — Progress Notes (Signed)
 Seth Eth, DNP, AGNP-c [facility] [address] [place], [place] [address] [phone]   ACUTE VISIT- ESTABLISHED PATIENT  Blood pressure 122/72, pulse (!) 56, weight 212 lb (96.2 kg), SpO2 98%.  Subjective:  HPI Seth [name] is a 83 y.o. male presents to day for evaluation of acute concern(s).   History of Present Illness Seth [name] "Orvilla Fus" is an 83 year old male who presents for evaluation and follow-up after a recent fall with lacerations to the right arm, right hand, and right forehead.   He experienced a fall on Saturday resulting in multiple lacerations, including a deep cut through the tendon and muscle on his arm that required six internal and thirteen external stitches. The fall occurred when he shattered a fountain vase after tripping while attempting to pick it up. He did hit his head, but he did not lose consciousness. He was seen in the ED and evaluated immediately following the event. He has been experiencing bruising and edema, particularly around the right eye, right cheek, right chin, and right neck area. The ecchymosis to the cheek, chin, and neck developed overnight. He feels weak but not dizzy. No excessive bleeding, but there is some oozing from the wounds, particularly the hand and forehead, which are still denuded and require pressure dressings. He has been using warm compresses for comfort and cold compresses to manage swelling of his right eye. There is also ecchymosis to the bridge of the nose and the left eye.   He is currently on Coumadin, which was held on Saturday due to the fall, and resumed on Sunday. His INR was 1.7 in the ER on Saturday after previously being 2.3 on the Thursday prior. He has been on Keflex for five days. He has no excessive bleeding, but the significant bruising mentioned above is still forming.    His potassium levels have been low on his last two labs. This was stopped due to spironolactone use and the  concern for hyperkalemia. He has started prednisone and an androgen blocker for his prostate cancer and recently started Lupron. It appears both the androgen blocker and Lupron can cause electrolyte imbalances. Since receiving treatment with androgen-reducing injections, which he completed in February, his PSA levels have significantly decreased. He has been more active since starting prostate medication, engaging in activities like yard work, but the recent fall has temporarily limited his activity.   He has been taking iron supplements for about a year. His hemoglobin was stable (for him) in the ER.   ROS negative except for what is listed in HPI. History, Medications, Surgery, SDOH, and Family History reviewed and updated as appropriate.  Objective:  Physical Exam Vitals and nursing note reviewed.  Constitutional:      Appearance: He is ill-appearing.  HENT:     Head: Raccoon eyes, abrasion, contusion, right periorbital erythema, left periorbital erythema and laceration present.     Jaw: There is normal jaw occlusion. Swelling present. No pain on movement.      Comments: Blue circle: Large gauze pressure bandage present. Images on phone from patients wife show denuded area. Pink: Dark purple ecchymosis. Right eye swollen shut.     Right Ear: Hearing normal.     Left Ear: Hearing normal.  Eyes:     Comments: Right eye swollen shut. Unable to visualize.   Cardiovascular:     Rate and Rhythm: Normal rate. Rhythm irregular.     Pulses: Normal pulses.     Heart sounds: Normal  heart sounds.  Pulmonary:     Effort: Pulmonary effort is normal.     Breath sounds: Normal breath sounds.  Musculoskeletal:     Cervical back: Tenderness present.  Skin:    General: Skin is warm.     Capillary Refill: Capillary refill takes less than 2 seconds.     Findings: Abrasion, bruising, ecchymosis and wound present.          Comments: Pink: Denuded area covered with bandage. Unable to visualize.  Images from patients phone show denuded area with scant bleeding.  Blue: Pressure dressing in place. Unable to visualize. Images from patients phone show well approximated laceration with simple interrupted sutures present. No signs of infection could be seen.   Neurological:     Mental Status: He is alert and oriented to person, place, and time.     Sensory: No sensory deficit.     Motor: Weakness present.  Psychiatric:        Attention and Perception: Attention normal.        Mood and Affect: Mood normal. Affect is flat.        Behavior: Behavior is cooperative.        Cognition and Memory: Cognition and memory normal.        Judgment: Judgment normal.         Assessment & Plan:   Problem List Items Addressed This Visit     Anticoagulated on Coumadin   Most recent INR 1.7 (Saturday) due for next monitoring on Thursday. Currently taking coumadin regular dosage as directed. Significant ecchymosis present to the right side of the face into the neck, left eye, and scattered primarily on the right side of the body. Expect this will continue to spread over the next few days. He does not appear to have any active bleeding at this time, which is reassuring. Given his history, we will allow him to continue on Coumadin but monitor very closely for bleeding. Recommend continue with INR on Thursday.      Fall at home - Primary   Fall on Saturday 03/16/2024 with significant injury to the right forehead, right hand, and right forearm. Ecchymosis is present along the right side of the face, neck, and body and the left eye. Right forearm laceration is deep and chart review shows this extended through the tendon. 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 He may benefit from physical therapy and gait training once he has recovered more from this incident to aid in his ability to remain safe. Very close monitoring and avoidance of lifting and carrying is recommended to  reduce fall risks.       Hypokalemia   Relevant Medications   potassium chloride SA (KLOR-CON M) 20 MEQ tablet   Laceration involving tendon   Right forearm laceration deep into the tissue and through the tendon. Did not remove bandages as these were changed right before the visit and dressings were clean and intact. Pictures on the patients phone were visualized which show that the laceration is well approximated with sutures in place. No signs of drainage or infection. Recommend continue with dressing. OK to shower and allow water to run over the wound on Thursday, with no rubbing or scrubbing. Pat dry gently. Return for suture removal in 1 week. Complete antibiotic. If any signs of infection or bleeding present, contact the office.       Forehead trauma   Large pressure dressing in place- visualized on  patients phone prior to dressing change. Area appears clean with scant bleeding. No signs of infection. Area denuded. Recommend continue to keep covered and monitor for bleeding. This will likely take several weeks to heal. With his diabetes history, care needs to be taken to ensure that infection does not present. Continue with dressing changes and close monitoring.       Hand trauma, right, initial encounter   Dressing in place- visualized on patients phone prior to dressing change. Area appears clean with scant bleeding. No signs of infection. Area denuded. Recommend continue to keep covered and monitor for bleeding. This will likely take several weeks to heal. With his diabetes history, care needs to be taken to ensure that infection does not present. Continue with dressing changes and close monitoring.      Eye swollen, right   Right eye swollen shut with significant ecchymosis surrounding. Recommend warm and cool compresses to aid in re-absorption of blood and reduced swelling, as well as comfort.          Seth Eth, DNP, AGNP-c

## 2024-03-19 NOTE — Assessment & Plan Note (Signed)
 Right forearm laceration deep into the tissue and through the tendon. Did not remove bandages as these were changed right before the visit and dressings were clean and intact. Pictures on the patients phone were visualized which show that the laceration is well approximated with sutures in place. No signs of drainage or infection. Recommend continue with dressing. OK to shower and allow water to run over the wound on Thursday, with no rubbing or scrubbing. Pat dry gently. Return for suture removal in 1 week. Complete antibiotic. If any signs of infection or bleeding present, contact the office.

## 2024-03-19 NOTE — Patient Instructions (Addendum)
 I have sent in the potassium to restart. You can take this once a day.   The bruising may get a little worse before it gets better. Using warm and cool compresses can help with pain and swelling and allow the bruising to reabsorb.   Keep taking the antibiotics. This will keep infection away.   You may shower on Thursday, just allow the water to roll off of the sutures and then only pat dry. Then replace the dressings.

## 2024-03-21 ENCOUNTER — Ambulatory Visit: Payer: Medicare Other | Attending: Cardiology | Admitting: Pharmacist

## 2024-03-21 DIAGNOSIS — Z7984 Long term (current) use of oral hypoglycemic drugs: Secondary | ICD-10-CM | POA: Diagnosis present

## 2024-03-21 DIAGNOSIS — I4891 Unspecified atrial fibrillation: Secondary | ICD-10-CM | POA: Insufficient documentation

## 2024-03-21 DIAGNOSIS — Z5181 Encounter for therapeutic drug level monitoring: Secondary | ICD-10-CM | POA: Diagnosis present

## 2024-03-21 LAB — POCT INR: INR: 2 (ref 2.0–3.0)

## 2024-03-21 NOTE — Patient Instructions (Signed)
 Description   Continue taking warfarin 1 tablet daily EXCEPT 2 tablet on Sundays and Wednesdays.   Stay consistent with greens each week (2-3 times per week).  Recheck INR in 4 weeks. Coumadin Clinic [phone].

## 2024-03-25 ENCOUNTER — Other Ambulatory Visit: Payer: Self-pay

## 2024-03-25 MED ORDER — DAPAGLIFLOZIN PROPANEDIOL 10 MG PO TABS: 10.0000 mg | ORAL_TABLET | Freq: Every day | ORAL | 11 refills | Status: DC

## 2024-03-26 ENCOUNTER — Encounter: Payer: Self-pay | Admitting: Nurse Practitioner

## 2024-03-26 ENCOUNTER — Ambulatory Visit: Admitting: Nurse Practitioner

## 2024-03-26 VITALS — BP 124/74 | HR 55 | Wt 209.4 lb

## 2024-03-26 DIAGNOSIS — R21 Rash and other nonspecific skin eruption: Secondary | ICD-10-CM | POA: Diagnosis not present

## 2024-03-26 DIAGNOSIS — T148XXA Other injury of unspecified body region, initial encounter: Secondary | ICD-10-CM

## 2024-03-26 MED ORDER — CLOTRIMAZOLE-BETAMETHASONE 1-0.05 % EX CREA: 1.0000 | TOPICAL_CREAM | Freq: Two times a day (BID) | CUTANEOUS | 1 refills | Status: DC

## 2024-04-08 ENCOUNTER — Encounter: Payer: Self-pay | Admitting: Nurse Practitioner

## 2024-04-08 NOTE — Progress Notes (Signed)
  Annella Kief, DNP, AGNP-c [facility] [address] [place], [place] [address] [phone]   ACUTE VISIT- ESTABLISHED PATIENT  Blood pressure 124/74, pulse (!) 55, weight 209 lb 6.4 oz (95 kg).  Subjective:  HPI Seth [name] is a 83 y.o. male presents to day for evaluation of acute concern(s).   Removal of sutures from right arm  ROS negative except for what is listed in HPI. History, Medications, Surgery, SDOH, and Family History reviewed and updated as appropriate.  Objective:  Physical Exam Vitals and nursing note reviewed.  Constitutional:      General: He is not in acute distress. Skin:    General: Skin is warm and dry.     Capillary Refill: Capillary refill takes less than 2 seconds.          Comments: Incision intact. Sutures removed and skin glue placed for continued healing and protection from infection.   Neurological:     General: No focal deficit present.     Mental Status: He is alert and oriented to person, place, and time.     Motor: Weakness present.     Gait: Gait abnormal.  Psychiatric:        Mood and Affect: Mood normal.        Behavior: Behavior normal.         Assessment & Plan:   Problem List Items Addressed This Visit     Rash - Primary   Relevant Medications   clotrimazole-betamethasone (LOTRISONE) cream   Laceration involving tendon   Area cleansed with betadine and sutures removed. Area healing well with edges approximated. Skin glue applied to the surface to allow for continued healing given the extent of the initial injury and to help reduce risk of infection. Pt tolerated well. No concerns.          Albie Bazin E Rinaldo Macqueen, DNP, AGNP-c

## 2024-04-08 NOTE — Assessment & Plan Note (Signed)
 Area cleansed with betadine and sutures removed. Area healing well with edges approximated. Skin glue applied to the surface to allow for continued healing given the extent of the initial injury and to help reduce risk of infection. Pt tolerated well. No concerns.

## 2024-04-18 ENCOUNTER — Other Ambulatory Visit: Payer: Self-pay | Admitting: Medical

## 2024-04-23 ENCOUNTER — Ambulatory Visit: Attending: Internal Medicine | Admitting: *Deleted

## 2024-04-23 DIAGNOSIS — Z5181 Encounter for therapeutic drug level monitoring: Secondary | ICD-10-CM | POA: Insufficient documentation

## 2024-04-23 DIAGNOSIS — I4891 Unspecified atrial fibrillation: Secondary | ICD-10-CM | POA: Diagnosis present

## 2024-04-23 LAB — POCT INR: INR: 1.9 — AB (ref 2.0–3.0)

## 2024-04-23 NOTE — Patient Instructions (Addendum)
 Description   Today take 1.5 tablets of warfarin then continue taking warfarin 1 tablet daily EXCEPT 2 tablets on Sundays and Wednesdays.   Stay consistent with greens each week (2-3 times per week).  Recheck INR in 3 weeks prior to vacations. Coumadin  Clinic [phone].

## 2024-04-24 ENCOUNTER — Other Ambulatory Visit (HOSPITAL_BASED_OUTPATIENT_CLINIC_OR_DEPARTMENT_OTHER): Payer: Self-pay | Admitting: Cardiovascular Disease

## 2024-05-05 ENCOUNTER — Other Ambulatory Visit: Payer: Self-pay | Admitting: Cardiovascular Disease

## 2024-05-05 DIAGNOSIS — I4891 Unspecified atrial fibrillation: Secondary | ICD-10-CM

## 2024-05-08 ENCOUNTER — Telehealth: Payer: Self-pay

## 2024-05-08 MED ORDER — CEPHALEXIN 500 MG PO CAPS: 500.0000 mg | ORAL_CAPSULE | Freq: Three times a day (TID) | ORAL | 0 refills | Status: AC

## 2024-05-08 NOTE — Telephone Encounter (Signed)
 Patient's wife called stating that patient's toe on right toe is draining red and oozing. She is requesting an appointment or an antibiotic to be called in. Patient has not been seen in the office in a year, so he will need an appointment. Could you please call patient and schedule appointment.

## 2024-05-09 ENCOUNTER — Ambulatory Visit (INDEPENDENT_AMBULATORY_CARE_PROVIDER_SITE_OTHER)

## 2024-05-09 ENCOUNTER — Ambulatory Visit (INDEPENDENT_AMBULATORY_CARE_PROVIDER_SITE_OTHER): Admitting: Podiatry

## 2024-05-09 ENCOUNTER — Other Ambulatory Visit: Payer: Self-pay | Admitting: Podiatry

## 2024-05-09 ENCOUNTER — Ambulatory Visit: Admitting: Podiatry

## 2024-05-09 VITALS — Ht 68.0 in | Wt 209.4 lb

## 2024-05-09 DIAGNOSIS — M2041 Other hammer toe(s) (acquired), right foot: Secondary | ICD-10-CM

## 2024-05-09 DIAGNOSIS — L97512 Non-pressure chronic ulcer of other part of right foot with fat layer exposed: Secondary | ICD-10-CM | POA: Diagnosis not present

## 2024-05-09 DIAGNOSIS — E11621 Type 2 diabetes mellitus with foot ulcer: Secondary | ICD-10-CM | POA: Diagnosis not present

## 2024-05-12 NOTE — Progress Notes (Signed)
  Subjective:  Patient ID: Seth [name], male    DOB: 10/25/1941,  MRN: [id number]  Chief Complaint  Patient presents with   Toe Injury    Pt is here due to right second toe injury pt states he stomp his toe which caused it to be swollen and red, wife states she cleaned it up and looks much better then it did before, pt is a diabetic.    83 y.o. male presents with the above complaint. History confirmed with patient.  He has been taking the cephalexin  that I prescribed when the appointment was scheduled yesterday  Objective:  Physical Exam: Right foot is relatively warm and well-perfused, full-thickness ulcer right second toe previous hallux amputation ulcer has exposed bone and mild serous drainage.  Measures 8 x 6 mm  Right foot radiograph with distal phalangeal erosion right second toe     Assessment:   1. Diabetic ulcer of toe of right foot associated with type 2 diabetes mellitus, with fat layer exposed (HCC)   2. Hammertoe of right foot      Plan:  Patient was evaluated and treated and all questions answered.   Ulcer right second toe -We discussed the etiology and factors that are a part of the wound healing process.  We also discussed the risk of infection both soft tissue and osteomyelitis from open ulceration.  Discussed the risk of limb loss if this happens or worsens. -Debridement as below. -Dressed with Silvadene, DSD. - Has quite a bit of depth.  Likely exposed bone.  Radiographs concerning for osteomyelitis.  Discussed partial amputation, they would like to try antibiotics first.  Continue the Keflex  for now.  Return in 2 to 3 weeks to reevaluate, they will not be able to return until June 9 due to travel plans.  Will continue antibiotics if we need to.  Wound culture was taken so far no growth but is preliminary  Procedure: Excisional Debridement of Wound Rationale: Removal of non-viable soft tissue from the wound to promote healing.  Anesthesia:  none Post-Debridement Wound Measurements: Noted above Type of Debridement: Sharp Excisional Tissue Removed: Non-viable soft tissue Depth of Debridement: subcutaneous tissue. Technique: Sharp excisional debridement to bleeding, viable wound base.  Dressing: Dry, sterile, compression dressing. Disposition: Patient tolerated procedure well.           Return in about 25 days (around 06/03/2024) for wound care.

## 2024-05-13 LAB — WOUND CULTURE
MICRO NUMBER:: 16460592
RESULT:: NO GROWTH
SPECIMEN QUALITY:: ADEQUATE

## 2024-05-13 LAB — HOUSE ACCOUNT TRACKING

## 2024-05-14 ENCOUNTER — Ambulatory Visit: Attending: Cardiology

## 2024-05-14 DIAGNOSIS — Z5181 Encounter for therapeutic drug level monitoring: Secondary | ICD-10-CM | POA: Insufficient documentation

## 2024-05-14 DIAGNOSIS — I4891 Unspecified atrial fibrillation: Secondary | ICD-10-CM | POA: Diagnosis present

## 2024-05-14 LAB — POCT INR: INR: 2.1 (ref 2.0–3.0)

## 2024-05-14 NOTE — Patient Instructions (Signed)
 Description   Continue taking warfarin 1 tablet daily EXCEPT 2 tablets on Sundays and Wednesdays.   Stay consistent with greens each week (2-3 times per week).  Recheck INR in 4 weeks prior to vacations. Coumadin  Clinic [phone].

## 2024-05-17 ENCOUNTER — Other Ambulatory Visit: Payer: Self-pay | Admitting: Nurse Practitioner

## 2024-05-17 ENCOUNTER — Ambulatory Visit: Payer: Self-pay | Admitting: Podiatry

## 2024-05-17 DIAGNOSIS — R4586 Emotional lability: Secondary | ICD-10-CM

## 2024-05-27 ENCOUNTER — Other Ambulatory Visit: Payer: Self-pay | Admitting: Nurse Practitioner

## 2024-05-27 DIAGNOSIS — R21 Rash and other nonspecific skin eruption: Secondary | ICD-10-CM

## 2024-06-03 ENCOUNTER — Ambulatory Visit (INDEPENDENT_AMBULATORY_CARE_PROVIDER_SITE_OTHER): Payer: Medicare Other | Admitting: Internal Medicine

## 2024-06-03 ENCOUNTER — Ambulatory Visit (INDEPENDENT_AMBULATORY_CARE_PROVIDER_SITE_OTHER)

## 2024-06-03 ENCOUNTER — Encounter: Payer: Self-pay | Admitting: Internal Medicine

## 2024-06-03 ENCOUNTER — Ambulatory Visit (INDEPENDENT_AMBULATORY_CARE_PROVIDER_SITE_OTHER): Admitting: Podiatry

## 2024-06-03 ENCOUNTER — Encounter: Payer: Self-pay | Admitting: Podiatry

## 2024-06-03 VITALS — BP 126/74 | HR 55 | Ht 68.0 in | Wt 215.0 lb

## 2024-06-03 DIAGNOSIS — Z794 Long term (current) use of insulin: Secondary | ICD-10-CM | POA: Diagnosis not present

## 2024-06-03 DIAGNOSIS — N1831 Chronic kidney disease, stage 3a: Secondary | ICD-10-CM | POA: Diagnosis not present

## 2024-06-03 DIAGNOSIS — L97512 Non-pressure chronic ulcer of other part of right foot with fat layer exposed: Secondary | ICD-10-CM | POA: Diagnosis not present

## 2024-06-03 DIAGNOSIS — E1152 Type 2 diabetes mellitus with diabetic peripheral angiopathy with gangrene: Secondary | ICD-10-CM

## 2024-06-03 DIAGNOSIS — M2041 Other hammer toe(s) (acquired), right foot: Secondary | ICD-10-CM

## 2024-06-03 DIAGNOSIS — E11621 Type 2 diabetes mellitus with foot ulcer: Secondary | ICD-10-CM

## 2024-06-03 DIAGNOSIS — M869 Osteomyelitis, unspecified: Secondary | ICD-10-CM

## 2024-06-03 DIAGNOSIS — E1143 Type 2 diabetes mellitus with diabetic autonomic (poly)neuropathy: Secondary | ICD-10-CM

## 2024-06-03 DIAGNOSIS — E1122 Type 2 diabetes mellitus with diabetic chronic kidney disease: Secondary | ICD-10-CM

## 2024-06-03 LAB — POCT GLYCOSYLATED HEMOGLOBIN (HGB A1C): Hemoglobin A1C: 7 % — AB (ref 4.0–5.6)

## 2024-06-03 MED ORDER — DOXYCYCLINE HYCLATE 100 MG PO TABS: 100.0000 mg | ORAL_TABLET | Freq: Two times a day (BID) | ORAL | 1 refills | Status: DC

## 2024-06-03 MED ORDER — GLIPIZIDE 5 MG PO TABS: ORAL_TABLET | ORAL | 3 refills | Status: DC

## 2024-06-03 NOTE — Progress Notes (Signed)
 Name: Seth [name]  Age/ Sex: 83 y.o., male   MRN/ DOB: [id number], 1941-02-06     PCP: Annella Kief, NP   Reason for Endocrinology Evaluation: Type 2 Diabetes Mellitus  Initial Endocrine Consultative Visit: 10/09/2019    PATIENT IDENTIFIER: Seth [name] is a 83 y.o. male with a past medical history of T2DM, HTN, PVD, CAD, A.Fib and Prostate Ca ( S/P chemo and radiation). The patient has followed with Endocrinology clinic since 10/09/2019 for consultative assistance with management of his diabetes.    DIABETIC HISTORY:  Mr. Freer was diagnosed with T2DM many years ago.  He was prescribed Jardiance  in the past, but unable to use due to cost.  hemoglobin A1c has ranged from 6.4% in 2015, peaking at 11.7% in 2020 On his initial visit to our clinic his A1c was 9.7%, he was on glipizide , Toujeo , and metformin .  We switched his Toujeo  to Humalog  mix, we stopped glipizide  and continued Metformin .  Lives with wife    Wife discontinued insulin  mix 01/2022 due to hypoglycemia, he was started on glipizide  and Farxiga  as well as continuing metformin   Metformin  discontinued 06/2022 due to low GFR  I attempted to switch glipizide  to repaglinide  but the patient developed multiple arthralgias that he attributed to repaglinide   Farxiga  was held 08/2023   SUBJECTIVE:   During the last visit (02/02/2024): A1c 7.6%.      Today (06/03/2024): Mr. Ginsberg is here for a follow-up on diabetes management. Accompanied by wife  . He checks his blood sugars multiple times a day through the CGM. The patient has not had hypoglycemic episodes since the last clinic visit.    He continues to follow-up with cardiology for PVD, ischemic cardiomyopathy and HTN   Patient follows with [facility]/hematology for prostate cancer recurrence, with T5 vertebral body metastasis, he is on degarelix injections  He has been following up with podiatry for right second toe ulcer  Denies nausea or vomiting Denies  constipation or diarrhea   He is on Farxiga  through Cardiology   HOME DIABETES REGIMEN:  Glipizide  5 mg, 1 tab BID    Statin: Yes ACE-I/ARB: Yes     CONTINUOUS GLUCOSE MONITORING RECORD INTERPRETATION   N/a      DIABETIC COMPLICATIONS: Microvascular complications:  Neuropathy , CKD III, S/P bilateral partial amputations (great toes) Denies:retinopathy  Last eye exam: Completed 12/2019   Macrovascular complications:  CAD, PVD Denies: CVA   HISTORY:  Past Medical History:  Past Medical History:  Diagnosis Date   Acidosis 07/19/2021   Acute on chronic diastolic (congestive) heart failure (HCC) 09/30/2014   AKI (acute kidney injury) (HCC) 12/15/2022   Atrial fibrillation (HCC)    a. coumadin ;  b. Amiodarone    CAD (coronary artery disease)    a.  Lexiscan  Myoview (02/14/14):  Apical, inferolateral scar; ? Mild ischemia; EF 32%.;  b.  LHC (02/21/14):  dLM 100%, LAD 100%, CFX 100%, dRCA 50%. 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 Echo (02/17/14):  EF 35-40%, anteroseptal and apical AK, Gr 2 DD, MAC, mod LAE.   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   Peripheral vascular disease, unspecified (HCC)    Personal history of nicotine dependence 07/19/2021   Prostate cancer (HCC)    Proteinuria 09/30/2009   Thrombocytopenia, unspecified (HCC)    Tobacco use disorder    Tobacco user 11/11/2009   Type 2 diabetes  mellitus with diabetic peripheral angiopathy without gangrene (HCC) 07/19/2021   Type II or unspecified type diabetes mellitus with renal manifestations, uncontrolled(250.42)    Urolithiasis 12/26/1997   Past Surgical History:  Past Surgical History:  Procedure Laterality Date   AMPUTATION TOE Bilateral 12/17/2022   Procedure: TOE AMPUTATION OF LEFT 1ST AND 2ND PARTIAL, AND RIGHT 1ST PARTIAL;  Surgeon: Floyce Hutching, DPM;  Location: [facility];  Service: Podiatry;  Laterality: Bilateral;   BACK SURGERY     CATARACT EXTRACTION  2010   CLIPPING OF ATRIAL APPENDAGE Left 02/24/2014   Procedure: CLIPPING OF ATRIAL APPENDAGE;  Surgeon: Bartley Lightning, MD;  Location: [facility] OR;  Service: Open Heart Surgery;  Laterality: Left;   COLONOSCOPY  2005   Normal   CORONARY ARTERY BYPASS GRAFT N/A 02/24/2014   Procedure: Coronary artery bypass graft times three using left internal mammary artery and left leg greater saphenous vein harvested endoscopically.;  Surgeon: Bartley Lightning, MD;  Location: [facility] OR;  Service: Open Heart Surgery;  Laterality: N/A;   INTRAOPERATIVE TRANSESOPHAGEAL ECHOCARDIOGRAM N/A 02/24/2014   Procedure: INTRAOPERATIVE TRANSESOPHAGEAL ECHOCARDIOGRAM;  Surgeon: Bartley Lightning, MD;  Location: [facility];  Service: Open Heart Surgery;  Laterality: N/A;   LEFT HEART CATHETERIZATION WITH CORONARY ANGIOGRAM N/A 02/21/2014   Procedure: LEFT HEART CATHETERIZATION WITH CORONARY ANGIOGRAM;  Surgeon: Peter M Swaziland, MD;  Location: [facility] CATH LAB;  Service: Cardiovascular;  Laterality: N/A;   Social History:  reports that he has never smoked. He quit smokeless tobacco use about 10 years ago.  His smokeless tobacco use included chew. He reports that he does not drink alcohol and does not use drugs. Family History:  Family History  Problem Relation Age of Onset   Cancer Mother 65       leukemia   Heart disease Father    Heart attack Father 27   Lung cancer Brother    Colon cancer Neg Hx    Rectal cancer Neg Hx       HOME MEDICATIONS: Allergies as of 06/03/2024       Reactions   Niacin         Medication List        Accurate as of June 03, 2024  1:36 PM. If you have any questions, ask your nurse or doctor.          abiraterone acetate 250 MG tablet Commonly known as: ZYTIGA Take 1,000 mg by mouth daily. 4 tablets daily   aspirin  EC 81 MG tablet Take 81 mg by mouth daily. Swallow whole.   atorvastatin  40 MG tablet Commonly known as: LIPITOR Take 1 tablet (40 mg total) by mouth daily.   Calcium  Carbonate-Vitamin D  600-5 MG-MCG Caps 1 cap, PO (by Mouth), Daily, 0 Refill(s)   carvedilol  12.5 MG tablet Commonly known as: COREG  Take 1 tablet (12.5 mg total) by mouth 2 (two) times daily with a meal.   citalopram  10 MG tablet Commonly known as: CELEXA  Take 1  tablet by mouth once daily   clotrimazole -betamethasone  cream Commonly known as: LOTRISONE  APPLY ONE APPLICATION OF CREAM TOPICALLY TWICE DAILY TO  RASH  ON  FOOT   dapagliflozin  propanediol 10 MG Tabs tablet Commonly known as: Farxiga  Take 1 tablet (10 mg total) by mouth daily.   Dexcom G7 Sensor Misc 1 Device by Does not apply route as directed.   ferrous sulfate 325 (65 FE) MG tablet Take by mouth.   FLAXSEED OIL PO Take 1 tablet by mouth daily.   glipiZIDE  5 MG tablet Commonly known as: GLUCOTROL  Take 2 tablets (10 mg total) by mouth daily before breakfast AND 1 tablet (5 mg total) daily before supper. What changed: See the new instructions. Changed by: Camilla Cedar Senai Kingsley   leuprolide 7.5 MG injection Commonly known as: LUPRON Inject 7.5 mg into the muscle. Every 90 days   MULTIVITAMINS PO Take 1 tablet by mouth daily.   potassium chloride  SA 20 MEQ tablet Commonly known as: KLOR-CON  M Take 1 tablet (20 mEq total) by mouth daily.   predniSONE 5 MG tablet Commonly known as: DELTASONE Take 1 tablet by mouth daily.   spironolactone  25 MG tablet Commonly known as: ALDACTONE  Take 1/2 (one-half)  tablet by mouth once daily   tamsulosin 0.4 MG Caps capsule Commonly known as: FLOMAX Take by mouth.   torsemide  20 MG tablet Commonly known as: DEMADEX  Take 1 tablet by mouth once daily   Vitamin C  500 MG Caps Take 1 capsule by mouth daily. What changed: how much to take   vitamin E 200 UNIT capsule Take 200 Units by mouth daily.   warfarin 5 MG tablet Commonly known as: COUMADIN  Take as directed by the anticoagulation clinic. If you are unsure how to take this medication, talk to your nurse or doctor. Original instructions: TAKE 1 TO 2 TABLETS BY MOUTH ONCE DAILY AS DIRECTED BY  ANTICOAGULATION  CLINIC         OBJECTIVE:   Vital Signs: BP 126/74 (BP Location: Left Arm, Patient Position: Sitting, Cuff Size: Normal)   Pulse (!) 55   Ht 5\' 8"  (1.727 m)   Wt 215 lb (97.5 kg)   SpO2 98%   BMI 32.69 kg/m   Wt Readings from Last 3 Encounters:  06/03/24 215 lb (97.5 kg)  05/09/24 209 lb 6.4 oz (95 kg)  03/26/24 209 lb 6.4 oz (95 kg)     Exam: General: Pt appears well and is in NAD  Lungs: Clear with good BS bilat   Heart: RRR   Extremities:  No  pretibial edema..  Neuro: MS is good with appropriate affect, pt is alert and Ox3       DM foot exam: 05/09/2024  per podiatry    DATA REVIEWED:  Lab Results  Component Value Date   HGBA1C 7.0 (A) 06/03/2024   HGBA1C 7.6 (A) 02/02/2024   HGBA1C 7.4 (A) 09/13/2023    Latest Reference Range & Units 11/09/23 00:00 11/10/23 00:00  COMPREHENSIVE METABOLIC PANEL  Rpt (E)   Sodium 137 - 147  144 (E)   Potassium 3.5 - 5.1 mEq/L 4.8 (E)   Chloride 99 - 108  105 (E)   CO2 13 - 22  24 ! (E)   Glucose  162 (E)   BUN 4 - 21  56 ! (E)   Creatinine 0.6 - 1.3  2.0 ! (E)   Calcium  8.7 - 10.7  9.4 (E)   eGFR  33 (E) 33.0 (E)  Albumin  3.5 -  5.0  3.5 (E)      ASSESSMENT / PLAN / RECOMMENDATIONS:   1) Type 2 Diabetes Mellitus, Optimally  controlled, With neuropathic, CKD III and macrovascular complications - Most recent  A1c of 7.0%. Goal A1c <7.0%.   - A1c has trended down  -Unable to download Dexcom, the patient has lost the receiver, using spouses cell phone, I have manually looked at the app but is limited with current BG 205 mg/DL, per spouse patient's BG's run around 250 Mg/DL -Will increase glipizide  as below for the morning only -Patient could not tolerate repaglinide  due to multiple arthralgias -He is on Farxiga  through cardiology -A prescription for Dexcom G7 will be faxed to her DME supplier, but has not been able to get the new prescription yet   MEDICATIONS:  Increase glipizide  5 mg, 2 tablets before breakfast 1 tablet before supper   EDUCATION / INSTRUCTIONS: BG monitoring instructions: Patient is instructed to check his blood sugars 3 times a day. Call [facility] if: BG persistently < 70  I reviewed the Rule of 15 for the treatment of hypoglycemia in detail with the patient. Literature supplied.   2)Diabetic complications:  Eye: Does not have known diabetic retinopathy.  Neuro/ Feet: Does have known diabetic peripheral neuropathy. Renal: Patient does have known baseline CKD. He is on an ACEI/ARB at present.  F/U in 6 months      Signed electronically by: Natale Bail, MD  [facility] Endocrinology  [facility] [address] [place] [place], [place] [address] Phone: [phone] FAX: [phone]   CC: Annella Kief, NP [address] [place] [address] Phone: [phone]  Fax: [phone]  Return to Endocrinology clinic as below: Future Appointments  Date Time Provider Department Center  06/03/2024  2:15 PM Charity Conch, DPM [facility]

## 2024-06-03 NOTE — Patient Instructions (Addendum)
 Take glipizide  5 mg, 2 tablets before breakfast 1 tablet before supper    HOW TO TREAT LOW BLOOD SUGARS (Blood sugar LESS THAN 70 MG/DL) Please follow the RULE OF 15 for the treatment of hypoglycemia treatment (when your (blood sugars are less than 70 mg/dL)   STEP 1: Take 15 grams of carbohydrates when your blood sugar is low, which includes:  3-4 GLUCOSE TABS  OR 3-4 OZ OF JUICE OR REGULAR SODA OR ONE TUBE OF GLUCOSE GEL    STEP 2: RECHECK blood sugar in 15 MINUTES STEP 3: If your blood sugar is still low at the 15 minute recheck --> then, go back to STEP 1 and treat AGAIN with another 15 grams of carbohydrates.

## 2024-06-03 NOTE — Progress Notes (Unsigned)
 Subjective:   Patient ID: Seth [name], male   DOB: 83 y.o.   MRN: [id number]   HPI Chief Complaint  Patient presents with   Wound Check    RM#11 for wound care right foot second toe.   83 year old male presents the office today with his wife for follow evaluation of a wound on the right second toe.  He is currently been under the care of Dr. Jeni Mitten for this.  He had a wound culture performed last appointment.  Although the wound is looking much better his wife is still concerned about swelling at the tip of the toe.  Did not see any drainage or pus.  No red streaks.  Currently denies any fevers or chills.  He recently just completed a course of antibiotics.  Previously was prescribed cephalexin .  He underwent right and left hallux as well as left partial 2nd toe amputation with Dr. Jeni Mitten 12/17/2022 which healed.   Review of Systems  All other systems reviewed and are negative.       Objective:  Physical Exam  General: AAO x3, NAD  Dermatological: At the distal aspect of the right second toe there is a hyperkeratotic lesion present.  Once I debrided this it appears that the ulceration is doing much better compared to the prior picture but there is still superficial opening present distally without any probing to bone, undermining or tunneling.  There is still some localized edema and erythema of the distal portion of the toe but there is no drainage or pus.  There is no fluctuation or crepitation.  There is no malodor.  No ascending cellulitis.  No other open lesions are present.       Vascular: Dorsalis Pedis artery and Posterior Tibial artery pedal pulses are palpable bilateral with immedate capillary fill time.  There is no pain with calf compression, swelling, warmth, erythema.   Neruologic: Sensation decreased.  Musculoskeletal: Hammertoes are present.  There is no pain on exam.      Assessment:   Ulceration right second toe with osteomyelitis distal  aspect     Plan:  -Treatment options discussed including all alternatives, risks, and complications -Etiology of symptoms were discussed -X-rays were obtained and reviewed with the patient.  Repeat x-rays were obtained and reviewed.  3 views were obtained.  Digital contractures present.  The lateral view there does appear to be increased absorption of the distal phalanx consistent with osteomyelitis.  There is no soft tissue emphysema. -Unfortunately patient is at risk of amputation of the toe and we did discuss this today.  However the patient's bleeding this week to go out of town for the summer and will be in the [place], [place] area.  I have strongly recommended patient follow-up with podiatry.  I will place a referral for [facility]. We discussed should there be any worsening he needs to go to the ER.  -Doxycyline -Continue daily dressing changes and continue with offloading shoe.  -Monitor for any clinical signs or symptoms of infection and directed to call the office immediately should any occur or go to the ER.  Return if symptoms worsen or fail to improve.  Charity Conch DPM

## 2024-06-10 ENCOUNTER — Other Ambulatory Visit: Payer: Self-pay

## 2024-06-10 ENCOUNTER — Telehealth: Payer: Self-pay | Admitting: Podiatry

## 2024-06-10 ENCOUNTER — Telehealth: Payer: Self-pay

## 2024-06-10 MED ORDER — DOXYCYCLINE HYCLATE 100 MG PO TABS: 100.0000 mg | ORAL_TABLET | Freq: Two times a day (BID) | ORAL | 0 refills | Status: DC

## 2024-06-10 NOTE — Telephone Encounter (Signed)
 Referral, office notes, xrays and demographics faxed to : [facility], phone [phone], fax [phone] Confirmation received

## 2024-06-10 NOTE — Telephone Encounter (Signed)
 As per patient's spouse calling regarding referral for [facility] in [place] . Doctor has not received referral. Patient contact telephone number, [phone]

## 2024-06-10 NOTE — Telephone Encounter (Signed)
-----   Message from Charity Conch sent at 06/05/2024  8:05 AM EDT ----- This patient is leaving for the summer but needs follow up. Can you fax the referral to [facility] in [place], [place] with my office notes? Can you send the x-rays??

## 2024-06-10 NOTE — Telephone Encounter (Signed)
 Spoke with wife. Patient is scheduled with [facility] for next week. There is a referral in chart but their office has not received anything from our office yet. Per verbal order from Dr. Mabel Savage refill for doxycycline  7 day supply sent to pharmacy requested by wife. Refill woul not go through electronically due to some unknown portal error. I have printed the prescription and will fax it to requested pharmacy.   Heather, can you please send referral, XR and notes to [facility].

## 2024-06-11 NOTE — Telephone Encounter (Signed)
 Thank you :)

## 2024-06-21 LAB — PROTIME-INR
INR: 2.1 — ABNORMAL HIGH (ref 0.9–1.2)
Prothrombin Time: 22 s — ABNORMAL HIGH (ref 9.1–12.0)

## 2024-07-12 ENCOUNTER — Other Ambulatory Visit: Payer: Self-pay | Admitting: Nurse Practitioner

## 2024-07-12 NOTE — Telephone Encounter (Signed)
 Patient is overdue for a visit with Sarabeth for medication check. I will refill for 30 days. Please schedule an appt

## 2024-08-22 ENCOUNTER — Other Ambulatory Visit: Payer: Self-pay | Admitting: Nurse Practitioner

## 2024-08-22 DIAGNOSIS — R4586 Emotional lability: Secondary | ICD-10-CM

## 2024-10-18 ENCOUNTER — Telehealth: Payer: Self-pay

## 2024-10-18 NOTE — Telephone Encounter (Signed)
 I called pts. Wife back, she was the one that needed a refill not him. He feel and broke his back in like 9 places. He is currently in a rehab facility in [place]. She is having the hospital send all the notes here. She is trying to get him transferred to a rehab facility here but is having a hard time. She will let us  know once they get back intown so they can schedule f/u appts. He will probably have to do a virtual appt. Hard for him to get in the office.    Copied from CRM [id number]. Topic: Clinical - Medication Question >> Oct 18, 2024 10:31 AM Seth [name] wrote: Reason for CRM: Patients wife called in requesting to speak o speak with nurse for a refill on lisinopril  down to 4 pills stated they're still in [place], patient fell and broke back. Recovering in rehab center there and will need help establishing help for his needs once he gets back.

## 2024-10-25 ENCOUNTER — Encounter: Admitting: Nurse Practitioner

## 2024-11-25 DEATH — deceased

## 2024-12-30 ENCOUNTER — Ambulatory Visit: Admitting: Internal Medicine
